# Patient Record
Sex: Female | Born: 1940 | Race: White | Hispanic: Yes | Marital: Married | Smoking: Former smoker
Health system: Western US, Academic
[De-identification: ages and names within clinical notes are randomized; demographics above are authoritative.]

## PROBLEM LIST (undated history)

## (undated) DIAGNOSIS — H409 Unspecified glaucoma: Secondary | ICD-10-CM

## (undated) DIAGNOSIS — R188 Other ascites: Secondary | ICD-10-CM

## (undated) DIAGNOSIS — J439 Emphysema, unspecified: Secondary | ICD-10-CM

## (undated) DIAGNOSIS — M8580 Other specified disorders of bone density and structure, unspecified site: Secondary | ICD-10-CM

## (undated) DIAGNOSIS — K862 Cyst of pancreas: Principal | ICD-10-CM

## (undated) DIAGNOSIS — I8500 Esophageal varices without bleeding: Secondary | ICD-10-CM

## (undated) DIAGNOSIS — K7460 Unspecified cirrhosis of liver: Secondary | ICD-10-CM

## (undated) DIAGNOSIS — D649 Anemia, unspecified: Secondary | ICD-10-CM

## (undated) DIAGNOSIS — B1920 Unspecified viral hepatitis C without hepatic coma: Secondary | ICD-10-CM

## (undated) DIAGNOSIS — G43909 Migraine, unspecified, not intractable, without status migrainosus: Secondary | ICD-10-CM

## (undated) HISTORY — DX: Unspecified viral hepatitis C without hepatic coma: B19.20

## (undated) HISTORY — PX: PB LAPS SURG CHOLECSTC W/EXPL COMMON DUCT: 47564

## (undated) HISTORY — DX: Esophageal varices without bleeding (CMS-HCC): I85.00

## (undated) HISTORY — DX: Migraine, unspecified, not intractable, without status migrainosus: G43.909

## (undated) HISTORY — DX: Other specified disorders of bone density and structure, unspecified site: M85.80

## (undated) HISTORY — DX: Cyst of pancreas: K86.2

## (undated) HISTORY — PX: TIPS PROCEDURE: SHX808

## (undated) HISTORY — DX: Anemia, unspecified: D64.9

## (undated) HISTORY — DX: Unspecified cirrhosis of liver (CMS-HCC): K74.60

## (undated) HISTORY — DX: Emphysema, unspecified (CMS-HCC): J43.9

## (undated) HISTORY — DX: Unspecified glaucoma: H40.9

## (undated) HISTORY — DX: Other ascites: R18.8

## (undated) HISTORY — PX: PB CSTOPLASTY/CSTOURTP PLSTC ANY: 51800

## (undated) MED ORDER — HEPARIN SODIUM (PORCINE) 10000 UNIT/ML IJ SOLN
5000.00 [IU] | Freq: Two times a day (BID) | INTRAMUSCULAR | Status: AC
Start: 2010-12-27 — End: ?

## (undated) MED ORDER — INSULIN GLARGINE 100 UNIT/ML SC SOLN
10.00 [IU] | Freq: Every evening | SUBCUTANEOUS | Status: AC
Start: 2010-12-27 — End: ?

---

## 2001-09-08 ENCOUNTER — Other Ambulatory Visit (INDEPENDENT_AMBULATORY_CARE_PROVIDER_SITE_OTHER): Payer: Self-pay | Admitting: Emergency Medicine

## 2001-09-08 LAB — URINALYSIS
Glucose: NEGATIVE
Ketones: NEGATIVE
Nitrite: NEGATIVE
Specific Gravity: 1.076 — ABNORMAL HIGH (ref 1.002–1.030)
Urobilinogen: 0.2 (ref 0.2–?)
pH: 7.5 (ref 5.0–8.0)

## 2001-09-09 ENCOUNTER — Other Ambulatory Visit (INDEPENDENT_AMBULATORY_CARE_PROVIDER_SITE_OTHER): Payer: Self-pay

## 2001-09-10 ENCOUNTER — Other Ambulatory Visit (HOSPITAL_BASED_OUTPATIENT_CLINIC_OR_DEPARTMENT_OTHER): Payer: Self-pay | Admitting: Pediatrics

## 2001-09-10 ENCOUNTER — Other Ambulatory Visit (HOSPITAL_BASED_OUTPATIENT_CLINIC_OR_DEPARTMENT_OTHER): Payer: Self-pay | Admitting: Internal Medicine

## 2001-09-10 LAB — BASIC METABOLIC PANEL, BLOOD
BUN: 5 mg/dL — ABNORMAL LOW (ref 8–18)
Bicarbonate: 30 mEq/L (ref 24–31)
Calcium: 7.8 mg/dL — ABNORMAL LOW (ref 9.0–10.6)
Chloride: 106 mEq/L (ref 97–107)
Creatinine: 0.7 mg/dL (ref 0.5–1.5)
Glucose: 122 mg/dL — ABNORMAL HIGH (ref 65–110)
Potassium: 3.8 mEq/L (ref 3.5–5.0)
Sodium: 139 mEq/L (ref 135–145)

## 2001-09-10 LAB — CBC WITH DIFF, BLOOD
Basophils: 2 % (ref 0–2)
Eosinophils: 2 % (ref 1–3)
Hct: 40.5 % (ref 36.0–46.0)
Hgb: 14.3 gm/dL (ref 12.0–16.0)
Lymphocytes: 20 % (ref 20–40)
MCH: 35.1 pg — ABNORMAL HIGH (ref 27–31)
MCHC: 35.2 % (ref 32–37)
MCV: 99.5 um3 — ABNORMAL HIGH (ref 82.0–98.0)
MPV: 8.3 fl (ref 7.4–10.4)
Monocytes: 11 % — ABNORMAL HIGH (ref 1–10)
Plt Count: 62 10*3/uL — ABNORMAL LOW (ref 130–400)
RBC: 4.07 10*6/uL (ref 4.00–5.00)
RDW: 12.1 % (ref 10–15)
Segs: 66 % (ref 45–70)
WBC: 3.6 10*3/uL — ABNORMAL LOW (ref 4.0–11.0)

## 2001-09-10 LAB — PHOSPHORUS, BLOOD: Phosphorous: 2.3 mg/dL — ABNORMAL LOW (ref 2.5–4.5)

## 2001-09-10 LAB — ADIF: Plt Est: LOW

## 2001-09-10 LAB — MAGNESIUM, BLOOD: Magnesium: 1.8 mg/dL (ref 1.8–2.5)

## 2001-09-13 LAB — BLOOD CULTURE

## 2002-02-16 ENCOUNTER — Other Ambulatory Visit (HOSPITAL_BASED_OUTPATIENT_CLINIC_OR_DEPARTMENT_OTHER): Payer: Self-pay | Admitting: Hepatology

## 2002-02-16 LAB — COMPREHENSIVE METABOLIC PANEL, BLOOD
ALT (SGPT): 288 IU/L — ABNORMAL HIGH (ref 10–45)
AST (SGOT): 239 IU/L — ABNORMAL HIGH (ref 10–45)
Albumin: 3.1 gm/dL — ABNORMAL LOW (ref 3.3–5.0)
Alkaline Phos: 74 IU/L (ref 30–130)
BUN: 18 mg/dL (ref 8–18)
Bicarbonate: 29 mEq/L (ref 24–31)
Bilirubin, Tot: 1.9 mg/dL — ABNORMAL HIGH (ref <1.2)
Calcium: 8.9 mg/dL — ABNORMAL LOW (ref 9.0–10.6)
Chloride: 97 mEq/L (ref 97–107)
Creatinine: 0.8 mg/dL (ref 0.5–1.5)
Glucose: 98 mg/dL (ref 65–110)
Potassium: 3.6 mEq/L (ref 3.5–5.0)
Sodium: 135 mEq/L (ref 135–145)
Total Protein: 7.9 gm/dL (ref 6.0–8.0)

## 2002-02-16 LAB — URIC ACID, BLOOD: Uric Acid: 4.9 mg/dL (ref 2.2–5.7)

## 2002-02-16 LAB — PROTHROMBIN TIME, BLOOD
INR: 1.4
Protime, Control: 10.1 s
Protime: 13.8 s — ABNORMAL HIGH (ref 9–12)

## 2002-02-16 LAB — CBC WITH DIFF, BLOOD
Basophils: 1 % (ref 0–2)
Eosinophils: 1 % (ref 1–3)
Hct: 42.4 % (ref 36.0–46.0)
Hgb: 15 gm/dL (ref 12.0–16.0)
Lymphocytes: 26 % (ref 20–40)
MCH: 35.3 pg — ABNORMAL HIGH (ref 27–31)
MCHC: 35.5 % (ref 32–37)
MCV: 99.4 um3 — ABNORMAL HIGH (ref 82.0–98.0)
MPV: 8.1 fl (ref 7.4–10.4)
Monocytes: 11 % — ABNORMAL HIGH (ref 1–10)
Plt Count: 67 10*3/uL — ABNORMAL LOW (ref 130–400)
RBC: 4.26 10*6/uL (ref 4.00–5.00)
RDW: 15.4 % — ABNORMAL HIGH (ref 10–15)
Segs: 61 % (ref 45–70)
WBC: 4.9 10*3/uL (ref 4.0–11.0)

## 2002-02-16 LAB — IRON BINDING CAPACITY, BLOOD: IBC (TIBC): 247 ug/dL — ABNORMAL LOW (ref 250–450)

## 2002-02-16 LAB — ADIF: Plt Est: LOW

## 2002-02-16 LAB — ALPHA FETOPROTEIN, SERUM: AFP: 5 ng/mL (ref <8)

## 2002-02-16 LAB — GAMMA GLUTAMYL TRANSFERASE, BLOOD: GGT: 24 IU/L (ref <30)

## 2002-02-16 LAB — IRON, BLOOD: Iron: 229 ug/dL — ABNORMAL HIGH (ref 65–175)

## 2002-02-16 LAB — APTT, BLOOD
PTT, Control: 27.1 s
PTT: 33.8 s — ABNORMAL HIGH (ref 25.0–33.0)

## 2002-02-16 LAB — TSH SENSITIVE, BLOOD: TSH Sensitive: 1.99 u[IU]/mL (ref 0.35–5.50)

## 2002-02-16 LAB — LIVER PANEL, BLOOD: Bilirubin, Dir: 0.4 mg/dL — ABNORMAL HIGH (ref <0.2)

## 2002-02-16 LAB — FERRITIN, BLOOD: Ferritin: 415 ng/mL

## 2002-02-16 LAB — AMYLASE, BLOOD: Amylase: 112 U/L (ref 30–150)

## 2002-02-17 LAB — HEPATITIS B CORE AB IGG, BLOOD: HBcAb IgG: NEGATIVE

## 2002-02-17 LAB — HEPATITIS B SURFACE AG, BLOOD: HBsAg: NEGATIVE

## 2002-02-18 LAB — HEPATITIS B SURFACE AB, BLOOD: HBsAb: NEGATIVE

## 2002-02-18 LAB — ANTI-SMOOTH MUSCLE AB, BLOOD: Anti-Smooth Muscle Ab: POSITIVE — ABNORMAL HIGH

## 2002-02-18 LAB — ANTI-SMOOTH MUSC AB-TITER, BLOOD: Anti-Smooth Muscle Ab Titer: 1:160 {titer} — ABNORMAL HIGH

## 2002-02-18 LAB — ANA (ANTI-NUCLEAR AB), BLOOD: ANA (Anti-Nuclear Ab): POSITIVE — ABNORMAL HIGH

## 2002-02-18 LAB — ANTI-NUCLEAR-AB-TITER, BLOOD: Anti-Nuclear Ab Titer: >1:640 {titer} — ABNORMAL HIGH

## 2002-02-21 LAB — HEPATITIS A IGM & TOTAL AB  PANEL, BLOOD
Hepatitis A Antibody IGM: NEGATIVE
Hepatitis A Antibody: POSITIVE

## 2002-02-23 LAB — HEPATITIS C RNA, QUANT BLOOD: Hepatitis C RNA Quant: 723000 IU/ml

## 2002-02-28 ENCOUNTER — Other Ambulatory Visit (INDEPENDENT_AMBULATORY_CARE_PROVIDER_SITE_OTHER): Payer: Self-pay | Admitting: Emergency Medicine

## 2002-03-08 LAB — HEPATITIS C GENOTYPING, BLOOD: Hepatitis C Genotyping: POSITIVE

## 2002-04-21 ENCOUNTER — Other Ambulatory Visit (HOSPITAL_BASED_OUTPATIENT_CLINIC_OR_DEPARTMENT_OTHER): Payer: Self-pay

## 2002-04-28 ENCOUNTER — Other Ambulatory Visit (HOSPITAL_BASED_OUTPATIENT_CLINIC_OR_DEPARTMENT_OTHER): Payer: Self-pay | Admitting: Hepatology

## 2002-04-28 LAB — ARTERIAL BLOOD GAS
BE, Art: 0.8 mmol/L
FIO2: 21 %
HCO3, Art: 24 mmol/L (ref 20–29)
O2 Sat, Art (Est): 97.6 %
Temp: 36.9 'C
pCO2, Art (T): 35 mmHG — ABNORMAL LOW (ref 36–46)
pCO2, Art (Uncorr): 35 mmHg
pH, Art (T): 7.46 (ref 7.35–7.46)
pH, Art (Uncorr): 7.46
pO2, Art (T): 95 mmHG (ref 74–109)
pO2, Art (Uncorr): 95 mmHg

## 2002-07-21 ENCOUNTER — Other Ambulatory Visit (HOSPITAL_BASED_OUTPATIENT_CLINIC_OR_DEPARTMENT_OTHER): Payer: Self-pay | Admitting: Hepatology

## 2002-07-21 LAB — APTT, BLOOD
PTT, Control: 25.7 s
PTT: 32.5 s (ref 25.0–33.0)

## 2002-07-21 LAB — CBC WITH DIFF, BLOOD
Basophils: 1 % (ref 0–2)
Eosinophils: 2 % (ref 1–3)
Hct: 42.3 % (ref 36.0–46.0)
Hgb: 14.9 gm/dL (ref 12.0–16.0)
Lymphocytes: 27 % (ref 20–40)
MCH: 35.1 pg — ABNORMAL HIGH (ref 27–31)
MCHC: 35.1 % (ref 32–37)
MCV: 99.9 um3 — ABNORMAL HIGH (ref 82.0–98.0)
MPV: 8.8 fl (ref 7.4–10.4)
Monocytes: 11 % — ABNORMAL HIGH (ref 1–10)
Plt Count: 63 10*3/uL — ABNORMAL LOW (ref 130–400)
RBC: 4.24 10*6/uL (ref 4.00–5.00)
RDW: 12.9 % (ref 10–15)
Segs: 59 % (ref 45–70)
WBC: 4 10*3/uL (ref 4.0–11.0)

## 2002-07-21 LAB — ADIF: Plt Est: LOW

## 2002-07-21 LAB — LIVER PANEL, BLOOD: Bilirubin, Dir: 0.7 mg/dL — ABNORMAL HIGH (ref <0.2)

## 2002-07-21 LAB — PROTHROMBIN TIME, BLOOD
INR: 1.4
Protime, Control: 9.9 s
Protime: 13.6 s — ABNORMAL HIGH (ref 9–12)

## 2002-07-21 LAB — COMPREHENSIVE METABOLIC PANEL, BLOOD
ALT (SGPT): 238 IU/L — ABNORMAL HIGH (ref 10–45)
AST (SGOT): 203 IU/L — ABNORMAL HIGH (ref 10–45)
Albumin: 3.1 gm/dL — ABNORMAL LOW (ref 3.3–5.0)
Alkaline Phos: 73 IU/L (ref 30–130)
Bilirubin, Tot: 2.6 mg/dL — ABNORMAL HIGH (ref <1.2)
Total Protein: 7.6 gm/dL (ref 6.0–8.0)

## 2002-07-21 LAB — BASIC METABOLIC PANEL, BLOOD
BUN: 9 mg/dL (ref 8–18)
Bicarbonate: 30 mEq/L (ref 24–31)
Calcium: 8.7 mg/dL — ABNORMAL LOW (ref 9.0–10.6)
Chloride: 99 mEq/L (ref 97–107)
Creatinine: 0.7 mg/dL (ref 0.5–1.5)
Glucose: 145 mg/dL — ABNORMAL HIGH (ref 65–110)
Potassium: 4.1 mEq/L (ref 3.5–5.0)
Sodium: 137 mEq/L (ref 135–145)

## 2002-07-21 LAB — MAGNESIUM, BLOOD: Magnesium: 1.9 mg/dL (ref 1.8–2.5)

## 2002-07-29 ENCOUNTER — Other Ambulatory Visit (HOSPITAL_BASED_OUTPATIENT_CLINIC_OR_DEPARTMENT_OTHER): Payer: Self-pay | Admitting: Hepatology

## 2002-10-18 ENCOUNTER — Other Ambulatory Visit: Payer: Self-pay | Admitting: Transplant Surgery

## 2002-10-27 ENCOUNTER — Other Ambulatory Visit (HOSPITAL_BASED_OUTPATIENT_CLINIC_OR_DEPARTMENT_OTHER): Payer: Self-pay | Admitting: Hepatology

## 2002-10-27 LAB — COMPREHENSIVE METABOLIC PANEL, BLOOD
ALT (SGPT): 180 IU/L — ABNORMAL HIGH (ref 10–45)
AST (SGOT): 144 IU/L — ABNORMAL HIGH (ref 10–45)
Albumin: 2.6 gm/dL — ABNORMAL LOW (ref 3.3–5.0)
Alkaline Phos: 67 IU/L (ref 30–130)
BUN: 4 mg/dL — ABNORMAL LOW (ref 8–18)
Bicarbonate: 27 mEq/L (ref 24–31)
Bilirubin, Tot: 2.4 mg/dL — ABNORMAL HIGH (ref <1.2)
Calcium: 8.1 mg/dL — ABNORMAL LOW (ref 9.0–10.6)
Chloride: 105 mEq/L (ref 97–107)
Creatinine: 0.6 mg/dL (ref 0.5–1.5)
Glucose: 138 mg/dL — ABNORMAL HIGH (ref 65–110)
Potassium: 4.3 mEq/L (ref 3.5–5.0)
Sodium: 137 mEq/L (ref 135–145)
Total Protein: 6.6 gm/dL (ref 6.0–8.0)

## 2002-10-27 LAB — PROTHROMBIN TIME, BLOOD
INR: 1.4
Protime, Control: 9.7 s
Protime: 13.8 s — ABNORMAL HIGH (ref 9–12)

## 2002-10-27 LAB — HEMOGRAM, BLOOD
Hct: 40.2 % (ref 36.0–46.0)
Hgb: 13.6 gm/dL (ref 12.0–16.0)
MCH: 34.4 pg — ABNORMAL HIGH (ref 27–31)
MCHC: 33.8 % (ref 32–37)
MCV: 102 um3 — ABNORMAL HIGH (ref 82.0–98.0)
Plt Count: 39 10*3/uL — ABNORMAL LOW (ref 130–400)
RBC: 3.95 10*6/uL — ABNORMAL LOW (ref 4.00–5.00)
RDW: 14.1 % (ref 10–15)
WBC: 3.6 10*3/uL — ABNORMAL LOW (ref 4.0–11.0)

## 2002-10-27 LAB — APTT, BLOOD
PTT, Control: 26.7 s
PTT: 33.8 s — ABNORMAL HIGH (ref 25.0–33.0)

## 2002-10-27 LAB — MAGNESIUM, BLOOD: Magnesium: 2 mg/dL (ref 1.8–2.5)

## 2002-10-27 LAB — GAMMA GLUTAMYL TRANSFERASE, BLOOD: GGT: 15 IU/L (ref <30)

## 2002-11-02 ENCOUNTER — Encounter (INDEPENDENT_AMBULATORY_CARE_PROVIDER_SITE_OTHER): Payer: Self-pay

## 2002-12-07 ENCOUNTER — Other Ambulatory Visit (HOSPITAL_BASED_OUTPATIENT_CLINIC_OR_DEPARTMENT_OTHER): Payer: Self-pay

## 2002-12-14 ENCOUNTER — Other Ambulatory Visit (HOSPITAL_BASED_OUTPATIENT_CLINIC_OR_DEPARTMENT_OTHER): Payer: Self-pay | Admitting: Hepatology

## 2002-12-14 ENCOUNTER — Other Ambulatory Visit: Payer: Self-pay | Admitting: Diagnostic Radiology

## 2002-12-14 ENCOUNTER — Other Ambulatory Visit (HOSPITAL_BASED_OUTPATIENT_CLINIC_OR_DEPARTMENT_OTHER): Payer: Self-pay

## 2002-12-14 LAB — ADIF: Plt Est: LOW

## 2002-12-14 LAB — CBC WITH DIFF, BLOOD
Basophils: 1 % (ref 0–2)
Eosinophils: 1 % (ref 1–3)
Hct: 38.8 % (ref 36.0–46.0)
Hgb: 13.1 gm/dL (ref 12.0–16.0)
Lymphocytes: 23 % (ref 20–40)
MCH: 34 pg — ABNORMAL HIGH (ref 27–31)
MCHC: 33.8 % (ref 32–37)
MCV: 101 um3 — ABNORMAL HIGH (ref 82.0–98.0)
MPV: 8.8 fl (ref 7.4–10.4)
Monocytes: 10 % (ref 1–10)
Plt Count: 51 10*3/uL — ABNORMAL LOW (ref 130–400)
RBC: 3.86 10*6/uL — ABNORMAL LOW (ref 4.00–5.00)
RDW: 14.2 % (ref 10–15)
Segs: 65 % (ref 45–70)
WBC: 3.8 10*3/uL — ABNORMAL LOW (ref 4.0–11.0)

## 2002-12-14 LAB — COMPREHENSIVE METABOLIC PANEL, BLOOD
ALT (SGPT): 149 IU/L — ABNORMAL HIGH (ref 10–45)
AST (SGOT): 130 IU/L — ABNORMAL HIGH (ref 10–45)
Albumin: 2.6 gm/dL — ABNORMAL LOW (ref 3.3–5.0)
Alkaline Phos: 74 IU/L (ref 30–130)
BUN: 8 mg/dL (ref 8–18)
Bicarbonate: 28 mEq/L (ref 24–31)
Bilirubin, Tot: 2.3 mg/dL — ABNORMAL HIGH (ref <1.2)
Calcium: 8 mg/dL — ABNORMAL LOW (ref 9.0–10.6)
Chloride: 101 mEq/L (ref 97–107)
Creatinine: 0.6 mg/dL (ref 0.5–1.5)
Glucose: 152 mg/dL — ABNORMAL HIGH (ref 65–110)
Potassium: 3.6 mEq/L (ref 3.5–5.0)
Sodium: 136 mEq/L (ref 135–145)
Total Protein: 6.7 gm/dL (ref 6.0–8.0)

## 2002-12-14 LAB — PROTHROMBIN TIME, BLOOD
INR: 1.4
Protime, Control: 9.8 s
Protime: 14 s — ABNORMAL HIGH (ref 9–12)

## 2002-12-14 LAB — IMMUNOGLOBULIN PANEL (IGA,IGG,IGM), BLOOD
IGA: 292 mg/dL (ref 66–436)
IGG: 2781 mg/dL — ABNORMAL HIGH (ref 791–1643)
IGM: 147 mg/dL (ref 43–279)

## 2002-12-14 LAB — LIPID(CHOL FRACT) PANEL, BLOOD
Cholesterol: 151 mg/dL (ref 120–200)
HDL-Cholesterol: 57 mg/dL (ref ?–85)
LDL-Chol (Direct): 65 mg/dL (ref <160)
Triglycerides: 63 mg/dL (ref 10–170)

## 2002-12-14 LAB — FREE THYROXINE, BLOOD: Free T4: 1.09 ng/dL (ref 0.90–1.80)

## 2002-12-14 LAB — LIVER PANEL, BLOOD: Bilirubin, Dir: 0.7 mg/dL — ABNORMAL HIGH (ref <0.2)

## 2002-12-14 LAB — PREALBUMIN (TRANSTHYRETIN), BLOOD: Prealbumin: 7 mg/dL — ABNORMAL LOW (ref 18–38)

## 2002-12-15 LAB — CMV IGG/IGM AB PANEL, BLOOD
CMV IGG Ab: POSITIVE
CMV IGM Ab: NEGATIVE

## 2002-12-15 LAB — HERPES SIMPLEX 1/2 IGG AB, BLOOD: Herpes Simplex 1/2 IGG Ab: POSITIVE

## 2002-12-15 LAB — RAPID PLASMA REAGIN, BLOOD: RPR: NONREACTIVE

## 2002-12-16 LAB — HEPATITIS C GENOTYPING, BLOOD: Hepatitis C Genotyping: POSITIVE

## 2002-12-16 LAB — HTLV 1/2, BLOOD: HTLV 1/2 Ab (Elisa): NEGATIVE

## 2002-12-16 LAB — HEPATITIS C RNA, QUANT BLOOD: Hepatitis C RNA Quant: >700000 IU/ml

## 2002-12-21 LAB — EPG, SERUM
A/G Ratio, EPG: 0.7
Albumin, EPG: 2.75 gm/dL — ABNORMAL LOW (ref 3.20–5.00)
Alpha 1, EPG: 0.2 gm/dL (ref 0.10–0.40)
Alpha 2, EPG: 0.4 gm/dL — ABNORMAL LOW (ref 0.60–1.10)
Beta, EPG: 0.49 gm/dL — ABNORMAL LOW (ref 0.60–1.30)
Gamma, EPG: 2.86 gm/dL — ABNORMAL HIGH (ref 0.70–1.50)
Total Protein, EPG: 6.7 gm/dL (ref 6.00–8.00)

## 2002-12-22 LAB — PIVKA2, BLOOD: PIVKA: <2 ng/mL (ref 0.0–3.5)

## 2002-12-23 LAB — E.BARR VIRUS IGG/IGM PANEL, BLOOD
Epstein Barr IGG Ab: POSITIVE
Epstein Barr IGM Ab: NEGATIVE

## 2003-02-01 ENCOUNTER — Other Ambulatory Visit (HOSPITAL_BASED_OUTPATIENT_CLINIC_OR_DEPARTMENT_OTHER): Payer: Self-pay | Admitting: Hepatology

## 2003-02-01 LAB — CBC WITH DIFF, BLOOD
Basophils: 1 % (ref 0–2)
Eosinophils: 2 % (ref 1–3)
Hct: 39.6 % (ref 36.0–46.0)
Hgb: 13.8 gm/dL (ref 12.0–16.0)
Lymphocytes: 27 % (ref 20–40)
MCH: 35.7 pg — ABNORMAL HIGH (ref 27–31)
MCHC: 34.9 % (ref 32–37)
MCV: 102 um3 — ABNORMAL HIGH (ref 82.0–98.0)
MPV: 8.9 fl (ref 7.4–10.4)
Monocytes: 9 % (ref 1–10)
Plt Count: 58 10*3/uL — ABNORMAL LOW (ref 130–400)
RBC: 3.88 10*6/uL — ABNORMAL LOW (ref 4.00–5.00)
RDW: 12.6 % (ref 10–15)
Segs: 60 % (ref 45–70)
WBC: 3.2 10*3/uL — ABNORMAL LOW (ref 4.0–11.0)

## 2003-02-01 LAB — PROTHROMBIN TIME, BLOOD
INR: 1.5
Protime, Control: 9.7 s
Protime: 14.3 s — ABNORMAL HIGH (ref 9–12)

## 2003-02-01 LAB — COMPREHENSIVE METABOLIC PANEL, BLOOD
ALT (SGPT): 208 IU/L — ABNORMAL HIGH (ref 10–45)
AST (SGOT): 209 IU/L — ABNORMAL HIGH (ref 10–45)
Albumin: 2.8 gm/dL — ABNORMAL LOW (ref 3.3–5.0)
Alkaline Phos: 82 IU/L (ref 30–130)
BUN: 9 mg/dL (ref 8–18)
Bicarbonate: 28 mEq/L (ref 24–31)
Bilirubin, Tot: 2.9 mg/dL — ABNORMAL HIGH (ref <1.2)
Calcium: 8.3 mg/dL — ABNORMAL LOW (ref 9.0–10.6)
Chloride: 101 mEq/L (ref 97–107)
Creatinine: 0.7 mg/dL (ref 0.5–1.5)
Glucose: 130 mg/dL — ABNORMAL HIGH (ref 65–110)
Potassium: 3.4 mEq/L — ABNORMAL LOW (ref 3.5–5.0)
Sodium: 135 mEq/L (ref 135–145)
Total Protein: 7.7 gm/dL (ref 6.0–8.0)

## 2003-02-01 LAB — APTT, BLOOD
PTT, Control: 26.5 s
PTT: 33.8 s — ABNORMAL HIGH (ref 25.0–33.0)

## 2003-02-01 LAB — ADIF: Plt Est: LOW

## 2003-02-01 LAB — BILIRUBIN, DIR BLOOD: Bilirubin, Dir: 0.8 mg/dL — ABNORMAL HIGH (ref <0.2)

## 2003-02-01 LAB — URIC ACID, BLOOD: Uric Acid: 4.2 mg/dL (ref 2.2–5.7)

## 2003-02-01 LAB — ALPHA FETOPROTEIN, SERUM: AFP: 5 ng/mL (ref <8)

## 2003-02-02 LAB — GLYCOSYLATED HGB(A1C), BLOOD: Glyco Hgb (A1C): 6.8 % — ABNORMAL HIGH (ref 4.6–6.2)

## 2003-03-03 ENCOUNTER — Other Ambulatory Visit (HOSPITAL_BASED_OUTPATIENT_CLINIC_OR_DEPARTMENT_OTHER): Payer: Self-pay | Admitting: Hepatology

## 2003-03-06 ENCOUNTER — Other Ambulatory Visit (HOSPITAL_BASED_OUTPATIENT_CLINIC_OR_DEPARTMENT_OTHER): Payer: Self-pay

## 2003-03-14 ENCOUNTER — Other Ambulatory Visit (HOSPITAL_BASED_OUTPATIENT_CLINIC_OR_DEPARTMENT_OTHER): Payer: Self-pay | Admitting: Hepatology

## 2003-03-14 LAB — ARTERIAL BLOOD GAS
BE, Art: 1.7 mmol/L
FIO2: 21 %
HCO3, Art: 25 mmol/L (ref 20–29)
O2 Sat, Art (Est): 97.4 %
Temp: 36.4 'C
pCO2, Art (T): 35 mmHG — ABNORMAL LOW (ref 36–46)
pCO2, Art (Uncorr): 36 mmHg
pH, Art (T): 7.47 — ABNORMAL HIGH (ref 7.35–7.46)
pH, Art (Uncorr): 7.46
pO2, Art (T): 87 mmHG (ref 74–109)
pO2, Art (Uncorr): 91 mmHg

## 2003-04-24 ENCOUNTER — Other Ambulatory Visit (HOSPITAL_BASED_OUTPATIENT_CLINIC_OR_DEPARTMENT_OTHER): Payer: Self-pay

## 2003-05-30 ENCOUNTER — Other Ambulatory Visit (HOSPITAL_BASED_OUTPATIENT_CLINIC_OR_DEPARTMENT_OTHER): Payer: Self-pay | Admitting: Internal Medicine

## 2003-06-29 ENCOUNTER — Other Ambulatory Visit (HOSPITAL_BASED_OUTPATIENT_CLINIC_OR_DEPARTMENT_OTHER): Payer: Self-pay | Admitting: Hepatology

## 2003-06-29 LAB — COMPREHENSIVE METABOLIC PANEL, BLOOD
ALT (SGPT): 179 IU/L — ABNORMAL HIGH (ref 10–45)
AST (SGOT): 149 IU/L — ABNORMAL HIGH (ref 10–45)
Albumin: 2.6 gm/dL — ABNORMAL LOW (ref 3.3–5.0)
Alkaline Phos: 112 IU/L (ref 30–130)
BUN: 9 mg/dL (ref 8–18)
Bicarbonate: 27 mEq/L (ref 24–31)
Bilirubin, Tot: 2.4 mg/dL — ABNORMAL HIGH (ref <1.2)
Calcium: 8.1 mg/dL — ABNORMAL LOW (ref 9.0–10.6)
Chloride: 99 mEq/L (ref 97–107)
Creatinine: 0.6 mg/dL (ref 0.5–1.5)
Glucose: 253 mg/dL — ABNORMAL HIGH (ref 65–110)
Potassium: 4 mEq/L (ref 3.5–5.0)
Sodium: 134 mEq/L — ABNORMAL LOW (ref 135–145)
Total Protein: 7.6 gm/dL (ref 6.0–8.0)

## 2003-06-29 LAB — CBC WITH DIFF, BLOOD
Hct: 43.1 % (ref 36.0–46.0)
Hgb: 14.6 gm/dL (ref 12.0–16.0)
MCH: 34.6 pg — ABNORMAL HIGH (ref 27–31)
MCHC: 33.9 % (ref 32–37)
MCV: 102 um3 — ABNORMAL HIGH (ref 82.0–98.0)
MPV: 8.8 fl (ref 7.4–10.4)
Plt Count: 67 10*3/uL — ABNORMAL LOW (ref 130–400)
RBC: 4.21 10*6/uL (ref 4.00–5.00)
RDW: 13 % (ref 10–15)
WBC: 6.1 10*3/uL (ref 4.0–11.0)

## 2003-06-29 LAB — URINALYSIS
Bilirubin: NEGATIVE
Ketones: NEGATIVE
Leuk Esterase: NEGATIVE
Nitrite: NEGATIVE
Protein: NEGATIVE
Specific Gravity: 1.025 (ref 1.002–1.030)
Urobilinogen: 0.2 (ref 0.2–?)
pH: 5.5 (ref 5.0–8.0)

## 2003-06-29 LAB — PROTHROMBIN TIME, BLOOD
INR: 1.6
Protime, Control: 10 s
Protime: 15.4 s — ABNORMAL HIGH (ref 9–12)

## 2003-06-29 LAB — MDIFF
Bands: 20 % — ABNORMAL HIGH (ref 0–15)
Diff Total: 100 %
Eosinophils: 1 % (ref 1–3)
Lymphocytes: 4 % — ABNORMAL LOW (ref 20–40)
Monocytes: 2 % (ref 1–10)
Plt Est: LOW
Segs: 73 % — ABNORMAL HIGH (ref 45–70)

## 2003-06-29 LAB — APTT, BLOOD
PTT, Control: 26.6 s
PTT: 34.5 s — ABNORMAL HIGH (ref 25.0–33.0)

## 2003-06-29 LAB — TSH SENSITIVE, BLOOD: TSH Sensitive: 0.79 u[IU]/mL (ref 0.35–5.50)

## 2003-06-29 LAB — ALPHA FETOPROTEIN, SERUM: AFP: 4 ng/mL (ref <8)

## 2003-06-29 LAB — LIVER PANEL, BLOOD: Bilirubin, Dir: 0.6 mg/dL — ABNORMAL HIGH (ref <0.2)

## 2003-06-30 ENCOUNTER — Other Ambulatory Visit (INDEPENDENT_AMBULATORY_CARE_PROVIDER_SITE_OTHER): Payer: Self-pay | Admitting: Emergency Medicine

## 2003-06-30 ENCOUNTER — Other Ambulatory Visit (INDEPENDENT_AMBULATORY_CARE_PROVIDER_SITE_OTHER): Payer: Self-pay | Admitting: Internal Medicine

## 2003-07-01 ENCOUNTER — Other Ambulatory Visit (HOSPITAL_BASED_OUTPATIENT_CLINIC_OR_DEPARTMENT_OTHER): Payer: Self-pay | Admitting: Internal Medicine

## 2003-07-12 ENCOUNTER — Other Ambulatory Visit (HOSPITAL_BASED_OUTPATIENT_CLINIC_OR_DEPARTMENT_OTHER): Payer: Self-pay | Admitting: Hepatology

## 2003-07-12 LAB — COMPREHENSIVE METABOLIC PANEL, BLOOD
ALT (SGPT): 152 IU/L — ABNORMAL HIGH (ref 10–45)
AST (SGOT): 155 IU/L — ABNORMAL HIGH (ref 10–45)
Albumin: 3.3 gm/dL (ref 3.3–5.0)
Alkaline Phos: 89 IU/L (ref 30–130)
BUN: 7 mg/dL — ABNORMAL LOW (ref 8–18)
Bicarbonate: 29 mEq/L (ref 24–31)
Bilirubin, Tot: 2.7 mg/dL — ABNORMAL HIGH (ref <1.2)
Calcium: 8.5 mg/dL — ABNORMAL LOW (ref 8.8–10.3)
Chloride: 96 mEq/L — ABNORMAL LOW (ref 97–107)
Creatinine: 0.6 mg/dL (ref 0.5–1.5)
Glucose: 92 mg/dL (ref 65–110)
Potassium: 3.9 mEq/L (ref 3.5–5.0)
Sodium: 133 mEq/L — ABNORMAL LOW (ref 135–145)
Total Protein: 8 gm/dL (ref 6.0–8.0)

## 2003-07-12 LAB — CBC WITH DIFF, BLOOD
Basophils: 1 % (ref 0–2)
Eosinophils: 2 % (ref 1–3)
Hct: 42.3 % (ref 36.0–46.0)
Hgb: 14.7 gm/dL (ref 12.0–16.0)
Lymphocytes: 29 % (ref 20–40)
MCH: 35.2 pg — ABNORMAL HIGH (ref 27–31)
MCHC: 34.8 % (ref 32–37)
MCV: 101 um3 — ABNORMAL HIGH (ref 82.0–98.0)
MPV: 8.4 fl (ref 7.4–10.4)
Monocytes: 8 % (ref 1–10)
Plt Count: 59 10*3/uL — ABNORMAL LOW (ref 130–400)
RBC: 4.19 10*6/uL (ref 4.00–5.00)
RDW: 13 % (ref 10–15)
Segs: 60 % (ref 45–70)
WBC: 3.4 10*3/uL — ABNORMAL LOW (ref 4.0–11.0)

## 2003-07-12 LAB — PROTHROMBIN TIME, BLOOD
INR: 1.6
Protime, Control: 10 s
Protime: 15.1 s — ABNORMAL HIGH (ref 9–12)

## 2003-07-12 LAB — LIVER PANEL, BLOOD: Bilirubin, Dir: 0.6 mg/dL — ABNORMAL HIGH (ref <0.2)

## 2003-07-12 LAB — ALPHA FETOPROTEIN, SERUM: AFP: 4 ng/mL (ref <8)

## 2003-07-12 LAB — APTT, BLOOD
PTT, Control: 27.3 s
PTT: 34.6 s — ABNORMAL HIGH (ref 25.0–33.0)

## 2003-07-12 LAB — TSH SENSITIVE, BLOOD: TSH Sensitive: 1.7 u[IU]/mL (ref 0.35–5.50)

## 2003-07-12 LAB — ADIF: Plt Est: LOW

## 2003-07-12 LAB — URIC ACID, BLOOD: Uric Acid: 4.8 mg/dL (ref 2.2–5.7)

## 2003-07-17 LAB — HEPATITIS C RNA, QUANT BLOOD: Hepatitis C RNA Quant: 142000 IU/ml

## 2003-08-07 ENCOUNTER — Other Ambulatory Visit (HOSPITAL_BASED_OUTPATIENT_CLINIC_OR_DEPARTMENT_OTHER): Payer: Self-pay | Admitting: Hepatology

## 2003-08-07 LAB — COMPREHENSIVE METABOLIC PANEL, BLOOD
ALT (SGPT): 180 IU/L — ABNORMAL HIGH (ref 10–45)
AST (SGOT): 172 IU/L — ABNORMAL HIGH (ref 10–45)
Albumin: 3 gm/dL — ABNORMAL LOW (ref 3.3–5.0)
Alkaline Phos: 94 IU/L (ref 30–130)
BUN: 10 mg/dL (ref 8–18)
Bicarbonate: 28 mEq/L (ref 24–31)
Bilirubin, Tot: 3.5 mg/dL — ABNORMAL HIGH (ref <1.2)
Calcium: 8.6 mg/dL — ABNORMAL LOW (ref 8.8–10.3)
Chloride: 103 mEq/L (ref 97–107)
Creatinine: 0.8 mg/dL (ref 0.5–1.5)
Glucose: 151 mg/dL — ABNORMAL HIGH (ref 65–110)
Potassium: 4.8 mEq/L (ref 3.5–5.0)
Sodium: 140 mEq/L (ref 135–145)
Total Protein: 7.3 gm/dL (ref 6.0–8.0)

## 2003-08-07 LAB — CBC WITH DIFF, BLOOD
Basophils: 1 % (ref 0–2)
Eosinophils: 2 % (ref 1–3)
Hct: 41.7 % (ref 36.0–46.0)
Hgb: 14.3 gm/dL (ref 12.0–16.0)
Lymphocytes: 26 % (ref 20–40)
MCH: 34.8 pg — ABNORMAL HIGH (ref 27–31)
MCHC: 34.4 % (ref 32–37)
MCV: 101 um3 — ABNORMAL HIGH (ref 82.0–98.0)
MPV: 9.6 fl (ref 7.4–10.4)
Monocytes: 9 % (ref 1–10)
Plt Count: 54 10*3/uL — ABNORMAL LOW (ref 130–400)
RBC: 4.12 10*6/uL (ref 4.00–5.00)
RDW: 12.7 % (ref 10–15)
Segs: 63 % (ref 45–70)
WBC: 3.5 10*3/uL — ABNORMAL LOW (ref 4.0–11.0)

## 2003-08-07 LAB — LIVER PANEL, BLOOD: Bilirubin, Dir: 1.1 mg/dL — ABNORMAL HIGH (ref <0.2)

## 2003-08-07 LAB — ADIF: Plt Est: LOW

## 2003-08-07 LAB — APTT, BLOOD
PTT, Control: 26.3 s
PTT: 34.9 s — ABNORMAL HIGH (ref 25.0–33.0)

## 2003-08-07 LAB — PROTHROMBIN TIME, BLOOD
INR: 1.4
Protime, Control: 10 s
Protime: 14.1 s — ABNORMAL HIGH (ref 9–12)

## 2003-08-16 ENCOUNTER — Other Ambulatory Visit (INDEPENDENT_AMBULATORY_CARE_PROVIDER_SITE_OTHER): Payer: Self-pay

## 2003-08-16 LAB — COMPREHENSIVE METABOLIC PANEL, BLOOD
ALT (SGPT): 171 [IU]/L — ABNORMAL HIGH (ref 10–45)
AST (SGOT): 178 [IU]/L — ABNORMAL HIGH (ref 10–45)
Albumin: 3 g/dL — ABNORMAL LOW (ref 3.3–5.0)
Alkaline Phos: 94 [IU]/L (ref 30–130)
BUN: 8 mg/dL (ref 8–18)
Bicarbonate: 30 meq/L (ref 24–31)
Bilirubin, Tot: 2.6 mg/dL — ABNORMAL HIGH (ref <1.2)
Calcium: 8.7 mg/dL — ABNORMAL LOW (ref 8.8–10.3)
Chloride: 101 meq/L (ref 97–107)
Creatinine: 0.7 mg/dL (ref 0.5–1.5)
Glucose: 131 mg/dL — ABNORMAL HIGH (ref 65–110)
Potassium: 4.1 meq/L (ref 3.5–5.0)
Sodium: 138 meq/L (ref 135–145)
Total Protein: 7.4 g/dL (ref 6.0–8.0)

## 2003-08-16 LAB — PROTHROMBIN TIME, BLOOD
INR: 1.4
Protime, Control: 10 s
Protime: 13.4 s — ABNORMAL HIGH (ref 9–12)

## 2003-08-16 LAB — CBC WITH DIFF, BLOOD
Basophils: 1 % (ref 0–2)
Eosinophils: 2 % (ref 1–3)
Hct: 41.3 % (ref 36.0–46.0)
Hgb: 14.1 g/dL (ref 12.0–16.0)
Lymphocytes: 24 % (ref 20–40)
MCH: 34 pg — ABNORMAL HIGH (ref 27–31)
MCHC: 34.3 % (ref 32–37)
MCV: 99.1 um3 — ABNORMAL HIGH (ref 82.0–98.0)
MPV: 9.3 fL (ref 7.4–10.4)
Monocytes: 12 % — ABNORMAL HIGH (ref 1–10)
Plt Count: 49 10*3/uL — ABNORMAL LOW (ref 130–400)
RBC: 4.16 10*6/uL (ref 4.00–5.00)
RDW: 15.5 % — ABNORMAL HIGH (ref 10–15)
Segs: 61 % (ref 45–70)
WBC: 2.5 10*3/uL — ABNORMAL LOW (ref 4.0–11.0)

## 2003-08-16 LAB — APTT, BLOOD
PTT, Control: 26.9 s
PTT: 33.7 s — ABNORMAL HIGH (ref 25.0–33.0)

## 2003-08-16 LAB — LIVER PANEL, BLOOD: Bilirubin, Dir: 0.7 mg/dL — ABNORMAL HIGH (ref <0.2)

## 2003-08-16 LAB — URIC ACID, BLOOD: Uric Acid: 5.5 mg/dL (ref 2.2–5.7)

## 2003-08-16 LAB — ADIF: Plt Est: LOW

## 2003-08-16 LAB — TSH SENSITIVE, BLOOD: TSH Sensitive: 2.6 u[IU]/mL (ref 0.35–5.50)

## 2003-08-16 LAB — ALPHA FETOPROTEIN, SERUM: AFP: 3 ng/mL (ref <8)

## 2003-08-16 LAB — CEA, BLOOD: CEA: 4.8 ng/mL

## 2003-08-22 LAB — HEPATITIS C RNA, QUANT BLOOD: Hepatitis C RNA Quant: 403000 [IU]/mL

## 2003-08-23 ENCOUNTER — Other Ambulatory Visit (INDEPENDENT_AMBULATORY_CARE_PROVIDER_SITE_OTHER): Payer: Self-pay | Admitting: Hepatology

## 2003-08-23 LAB — MDIFF
Bands: 7 % (ref 0–15)
Eosinophils: 2 % (ref 1–3)
Lymphocytes: 38 % (ref 20–40)
Monocytes: 6 % (ref 1–10)
Plt Est: LOW
Segs: 100 %
Segs: 47 % (ref 45–70)

## 2003-08-23 LAB — COMPREHENSIVE METABOLIC PANEL, BLOOD
ALT (SGPT): 105 IU/L — ABNORMAL HIGH (ref 10–45)
AST (SGOT): 94 IU/L — ABNORMAL HIGH (ref 10–45)
Albumin: 2.8 gm/dL — ABNORMAL LOW (ref 3.3–5.0)
Alkaline Phos: 94 IU/L (ref 30–130)
BUN: 11 mg/dL (ref 8–18)
Bicarbonate: 29 mEq/L (ref 24–31)
Bilirubin, Tot: 3 mg/dL — ABNORMAL HIGH (ref <1.2)
Calcium: 8.5 mg/dL — ABNORMAL LOW (ref 8.8–10.3)
Chloride: 101 mEq/L (ref 97–107)
Creatinine: 0.7 mg/dL (ref 0.5–1.5)
Glucose: 124 mg/dL — ABNORMAL HIGH (ref 65–110)
Potassium: 4.4 mEq/L (ref 3.5–5.0)
Sodium: 138 mEq/L (ref 135–145)
Total Protein: 7.5 gm/dL (ref 6.0–8.0)

## 2003-08-23 LAB — CBC WITH DIFF, BLOOD
Hct: 38.5 % (ref 36.0–46.0)
Hgb: 13.4 gm/dL (ref 12.0–16.0)
MCH: 34.2 pg — ABNORMAL HIGH (ref 27–31)
MCHC: 34.7 % (ref 32–37)
MCV: 98.6 um3 — ABNORMAL HIGH (ref 82.0–98.0)
MPV: 9.8 fL (ref 7.4–10.4)
Plt Count: 43 10*3/uL — ABNORMAL LOW (ref 130–400)
RBC: 3.91 10*6/uL — ABNORMAL LOW (ref 4.00–5.00)
RDW: 15.3 % — ABNORMAL HIGH (ref 10–15)
WBC: 2.4 10*3/uL — ABNORMAL LOW (ref 4.0–11.0)

## 2003-08-23 LAB — PROTHROMBIN TIME, BLOOD
INR: 1.4
Protime, Control: 10 s
Protime: 13.8 s — ABNORMAL HIGH (ref 9–12)

## 2003-08-23 LAB — APTT, BLOOD
PTT, Control: 26.7 s
PTT: 34.6 s — ABNORMAL HIGH (ref 25.0–33.0)

## 2003-08-29 LAB — HEPATITIS C RNA, QUANT BLOOD: Hepatitis C RNA Quant: 33500 IU/ml

## 2003-08-30 LAB — CBC WITH DIFF, BLOOD
Hct: 37.1 % (ref 36.0–46.0)
Hgb: 12.5 gm/dL (ref 12.0–16.0)
MCH: 34 pg — ABNORMAL HIGH (ref 27–31)
MCHC: 33.6 % (ref 32–37)
MCV: 101 um3 — ABNORMAL HIGH (ref 82.0–98.0)
MPV: 8.5 fL (ref 7.4–10.4)
Plt Count: 41 10*3/uL — ABNORMAL LOW (ref 130–400)
RBC: 3.67 10*6/uL — ABNORMAL LOW (ref 4.00–5.00)
RDW: 13.8 % (ref 10–15)
WBC: 2.1 10*3/uL — ABNORMAL LOW (ref 4.0–11.0)

## 2003-08-30 LAB — MDIFF
Bands: 14 % (ref 0–15)
Eosinophils: 1 % (ref 1–3)
Lymphocytes: 32 % (ref 20–40)
Monocytes: 9 % (ref 1–10)
Other: 2 % — ABNORMAL HIGH (ref 0–0)
Plt Est: LOW
Segs: 100 %
Segs: 42 % — ABNORMAL LOW (ref 45–70)

## 2003-08-30 LAB — PROTHROMBIN TIME, BLOOD
INR: 1.5
Protime, Control: 10 s
Protime: 14.3 s — ABNORMAL HIGH (ref 9–12)

## 2003-08-30 LAB — APTT, BLOOD
PTT, Control: 26.8 s
PTT: 34.9 s — ABNORMAL HIGH (ref 25.0–33.0)

## 2003-09-06 LAB — COMPREHENSIVE METABOLIC PANEL, BLOOD
ALT (SGPT): 34 IU/L (ref 10–45)
AST (SGOT): 48 IU/L — ABNORMAL HIGH (ref 10–45)
Albumin: 2.6 gm/dL — ABNORMAL LOW (ref 3.3–5.0)
Alkaline Phos: 92 IU/L (ref 30–130)
BUN: 8 mg/dL (ref 8–18)
Bicarbonate: 29 mEq/L (ref 24–31)
Bilirubin, Tot: 2.9 mg/dL — ABNORMAL HIGH (ref <1.2)
Calcium: 8.2 mg/dL — ABNORMAL LOW (ref 8.8–10.3)
Chloride: 104 mEq/L (ref 97–107)
Creatinine: 0.7 mg/dL (ref 0.5–1.5)
Glucose: 116 mg/dL — ABNORMAL HIGH (ref 65–110)
Potassium: 4.1 meq/L (ref 3.5–5.0)
Sodium: 139 meq/L (ref 135–145)
Total Protein: 6.7 gm/dL (ref 6.0–8.0)

## 2003-09-06 LAB — CBC WITH DIFF, BLOOD
Hct: 34.2 % — ABNORMAL LOW (ref 36.0–46.0)
Hgb: 11.9 gm/dL — ABNORMAL LOW (ref 12.0–16.0)
MCH: 34.5 pg — ABNORMAL HIGH (ref 27–31)
MCHC: 34.9 % (ref 32–37)
MCV: 99 um3 — ABNORMAL HIGH (ref 82.0–98.0)
MPV: 8.8 fL (ref 7.4–10.4)
Plt Count: 46 10*3/uL — ABNORMAL LOW (ref 130–400)
RBC: 3.46 10*6/uL — ABNORMAL LOW (ref 4.00–5.00)
RDW: 16.5 % — ABNORMAL HIGH (ref 10–15)
WBC: 1.7 10*3/uL — ABNORMAL LOW (ref 4.0–11.0)

## 2003-09-06 LAB — MDIFF
Bands: 14 % (ref 0–15)
Lymphocytes: 41 % — ABNORMAL HIGH (ref 20–40)
Monocytes: 7 % (ref 1–10)
Plt Est: LOW
Segs: 100 %
Segs: 38 % — ABNORMAL LOW (ref 45–70)

## 2003-09-06 LAB — LIVER PANEL, BLOOD: Bilirubin, Dir: 0.8 mg/dL — ABNORMAL HIGH (ref <0.2)

## 2003-09-06 LAB — APTT, BLOOD
PTT, Control: 26.7 s
PTT: 36.1 s — ABNORMAL HIGH (ref 25.0–33.0)

## 2003-09-06 LAB — HEPATITIS C RNA, QUANT BLOOD: Hepatitis C RNA Quant: 1030 IU/ml

## 2003-09-06 LAB — PROTHROMBIN TIME, BLOOD
INR: 1.5
Protime, Control: 10 s
Protime: 14.3 s — ABNORMAL HIGH (ref 9–12)

## 2003-09-06 LAB — ALPHA FETOPROTEIN, SERUM: AFP: 3 ng/mL (ref <8)

## 2003-09-15 LAB — HEPATITIS C GENOTYPING, BLOOD

## 2003-09-15 LAB — HEPATITIS C RNA, QUANT BLOOD: Hepatitis C RNA Quant: <600 IU/ml

## 2003-09-18 LAB — COMPREHENSIVE METABOLIC PANEL, BLOOD
ALT (SGPT): 27 IU/L (ref 10–45)
AST (SGOT): 41 IU/L (ref 10–45)
Albumin: 2.6 gm/dL — ABNORMAL LOW (ref 3.3–5.0)
Alkaline Phos: 96 IU/L (ref 30–130)
BUN: 7 mg/dL — ABNORMAL LOW (ref 8–18)
Bicarbonate: 30 meq/L (ref 24–31)
Bilirubin, Tot: 2.2 mg/dL — ABNORMAL HIGH (ref <1.2)
Calcium: 8 mg/dL — ABNORMAL LOW (ref 8.8–10.3)
Chloride: 101 mEq/L (ref 97–107)
Creatinine: 0.6 mg/dL (ref 0.5–1.5)
Glucose: 152 mg/dL — ABNORMAL HIGH (ref 65–110)
Potassium: 4.6 mEq/L (ref 3.5–5.0)
Sodium: 137 mEq/L (ref 135–145)
Total Protein: 7.1 gm/dL (ref 6.0–8.0)

## 2003-09-18 LAB — CBC WITH DIFF, BLOOD
Basophils: 0 % (ref 0–2)
Eosinophils: 0 % — ABNORMAL LOW (ref 1–3)
Hct: 35.5 % — ABNORMAL LOW (ref 36.0–46.0)
Hgb: 11.9 gm/dL — ABNORMAL LOW (ref 12.0–16.0)
Lymphocytes: 40 % (ref 20–40)
MCH: 34.7 pg — ABNORMAL HIGH (ref 27–31)
MCHC: 33.5 % (ref 32–37)
MCV: 104 um3 — ABNORMAL HIGH (ref 82.0–98.0)
MPV: 10.2 fL (ref 7.4–10.4)
Monocytes: 11 % — ABNORMAL HIGH (ref 1–10)
Plt Count: 36 10*3/uL — ABNORMAL LOW (ref 130–400)
RBC: 3.43 10*6/uL — ABNORMAL LOW (ref 4.00–5.00)
RDW: 15.1 % — ABNORMAL HIGH (ref 10–15)
Segs: 48 % (ref 45–70)
WBC: 1.6 10*3/uL — ABNORMAL LOW (ref 4.0–11.0)

## 2003-09-18 LAB — APTT, BLOOD
PTT, Control: 27 s
PTT: 35.7 s — ABNORMAL HIGH (ref 25.0–33.0)

## 2003-09-18 LAB — PROTHROMBIN TIME, BLOOD
INR: 1.4
Protime, Control: 10 s
Protime: 13.8 s — ABNORMAL HIGH (ref 9–12)

## 2003-09-18 LAB — ADIF: Plt Est: LOW

## 2003-09-18 LAB — ALPHA FETOPROTEIN, SERUM: AFP: <2 ng/mL (ref <8)

## 2003-09-18 LAB — LIVER PANEL, BLOOD: Bilirubin, Dir: 0.6 mg/dL — ABNORMAL HIGH (ref <0.2)

## 2003-09-27 LAB — CBC WITH DIFF, BLOOD
Basophils: 2 % (ref 0–2)
Eosinophils: 1 % (ref 1–3)
Hct: 36.4 % (ref 36.0–46.0)
Hgb: 12.1 gm/dL (ref 12.0–16.0)
Lymphocytes: 45 % — ABNORMAL HIGH (ref 20–40)
MCH: 35 pg — ABNORMAL HIGH (ref 27–31)
MCHC: 33.3 % (ref 32–37)
MCV: 105 um3 — ABNORMAL HIGH (ref 82.0–98.0)
MPV: 10.7 fL — ABNORMAL HIGH (ref 7.4–10.4)
Monocytes: 12 % — ABNORMAL HIGH (ref 1–10)
Plt Count: 39 10*3/uL — ABNORMAL LOW (ref 130–400)
RBC: 3.46 10*6/uL — ABNORMAL LOW (ref 4.00–5.00)
RDW: 15 % (ref 10–15)
Segs: 41 % — ABNORMAL LOW (ref 45–70)
WBC: 1.6 10*3/uL — ABNORMAL LOW (ref 4.0–11.0)

## 2003-09-27 LAB — PROTHROMBIN TIME, BLOOD
INR: 1.4
Protime, Control: 10 s
Protime: 14 s — ABNORMAL HIGH (ref 9–12)

## 2003-09-27 LAB — ADIF: Plt Est: LOW

## 2003-09-27 LAB — COMPREHENSIVE METABOLIC PANEL, BLOOD
ALT (SGPT): 27 IU/L (ref 10–45)
AST (SGOT): 46 IU/L — ABNORMAL HIGH (ref 10–45)
Albumin: 2.6 gm/dL — ABNORMAL LOW (ref 3.3–5.0)
Alkaline Phos: 88 IU/L (ref 30–130)
BUN: 8 mg/dL (ref 8–18)
Bicarbonate: 26 mEq/L (ref 24–31)
Bilirubin, Tot: 2.2 mg/dL — ABNORMAL HIGH (ref <1.2)
Calcium: 8.1 mg/dL — ABNORMAL LOW (ref 8.8–10.3)
Chloride: 104 mEq/L (ref 97–107)
Creatinine: 0.7 mg/dL (ref 0.5–1.5)
Glucose: 115 mg/dL — ABNORMAL HIGH (ref 65–110)
Potassium: 4.3 mEq/L (ref 3.5–5.0)
Sodium: 139 mEq/L (ref 135–145)
Total Protein: 6.9 gm/dL (ref 6.0–8.0)

## 2003-09-27 LAB — APTT, BLOOD
PTT, Control: 27 s
PTT: 38.9 s — ABNORMAL HIGH (ref 25.0–33.0)

## 2003-09-27 LAB — LIVER PANEL, BLOOD: Bilirubin, Dir: 0.7 mg/dL — ABNORMAL HIGH (ref <0.2)

## 2003-10-18 ENCOUNTER — Other Ambulatory Visit (HOSPITAL_BASED_OUTPATIENT_CLINIC_OR_DEPARTMENT_OTHER): Payer: Self-pay | Admitting: Family

## 2003-10-18 LAB — CBC WITH DIFF, BLOOD
Basophils: 0 % (ref 0–2)
Eosinophils: 1 % (ref 1–3)
Hct: 34.6 % — ABNORMAL LOW (ref 36.0–46.0)
Hgb: 11.6 g/dL — ABNORMAL LOW (ref 12.0–16.0)
Lymphocytes: 40 % (ref 20–40)
MCH: 35.5 pg — ABNORMAL HIGH (ref 27–31)
MCHC: 33.6 % (ref 32–37)
MCV: 105 um3 — ABNORMAL HIGH (ref 82.0–98.0)
MPV: 11.3 fL — ABNORMAL HIGH (ref 7.4–10.4)
Monocytes: 11 % — ABNORMAL HIGH (ref 1–10)
Plt Count: 39 10*3/uL — ABNORMAL LOW (ref 130–400)
RBC: 3.28 10*6/uL — ABNORMAL LOW (ref 4.00–5.00)
RDW: 14.4 % (ref 10–15)
Segs: 48 % (ref 45–70)
WBC: 1.4 10*3/uL — ABNORMAL LOW (ref 4.0–11.0)

## 2003-10-18 LAB — ADIF: Plt Est: LOW

## 2003-10-18 LAB — LIVER PANEL, BLOOD: Bilirubin, Dir: 0.7 mg/dL — ABNORMAL HIGH (ref <0.2)

## 2003-10-18 LAB — COMPREHENSIVE METABOLIC PANEL, BLOOD
ALT (SGPT): 26 [IU]/L (ref 10–45)
AST (SGOT): 43 [IU]/L (ref 10–45)
Albumin: 2.5 g/dL — ABNORMAL LOW (ref 3.3–5.0)
Alkaline Phos: 105 [IU]/L (ref 30–130)
BUN: 5 mg/dL — ABNORMAL LOW (ref 8–18)
Bicarbonate: 28 meq/L (ref 24–31)
Bilirubin, Tot: 2.2 mg/dL — ABNORMAL HIGH (ref <1.2)
Calcium: 8.3 mg/dL — ABNORMAL LOW (ref 8.8–10.3)
Chloride: 102 meq/L (ref 97–107)
Creatinine: 0.7 mg/dL (ref 0.5–1.5)
Glucose: 142 mg/dL — ABNORMAL HIGH (ref 65–110)
Potassium: 4.4 meq/L (ref 3.5–5.0)
Sodium: 138 meq/L (ref 135–145)
Total Protein: 7.1 g/dL (ref 6.0–8.0)

## 2003-10-18 LAB — PROTHROMBIN TIME, BLOOD
INR: 1.3
Protime, Control: 10 s
Protime: 13.2 s — ABNORMAL HIGH (ref 9–12)

## 2003-10-18 LAB — ALPHA FETOPROTEIN, SERUM: AFP: 3 ng/mL (ref <8)

## 2003-10-18 LAB — APTT, BLOOD
PTT, Control: 26.5 s
PTT: 37.8 s — ABNORMAL HIGH (ref 25.0–33.0)

## 2003-10-23 LAB — HEPATITIS C RNA, QUANT BLOOD: Hepatitis C RNA Quant: <600 IU/ml

## 2003-10-27 LAB — PIVKA2, BLOOD: PIVKA: 2.8 ng/mL (ref 0.0–3.5)

## 2003-11-08 ENCOUNTER — Other Ambulatory Visit (INDEPENDENT_AMBULATORY_CARE_PROVIDER_SITE_OTHER): Payer: Self-pay | Admitting: Hepatology

## 2003-11-08 LAB — CBC WITH DIFF, BLOOD
Hct: 33.9 % — ABNORMAL LOW (ref 36.0–46.0)
Hgb: 11.1 gm/dL — ABNORMAL LOW (ref 12.0–16.0)
MCH: 34.9 pg — ABNORMAL HIGH (ref 27–31)
MCHC: 32.9 % (ref 32–37)
MCV: 106 um3 — ABNORMAL HIGH (ref 82.0–98.0)
MPV: 11.3 fl — ABNORMAL HIGH (ref 7.4–10.4)
Plt Count: 43 10*3/uL — ABNORMAL LOW (ref 130–400)
RBC: 3.19 10*6/uL — ABNORMAL LOW (ref 4.00–5.00)
RDW: 14.7 % (ref 10–15)
WBC: 1.3 10*3/uL — ABNORMAL LOW (ref 4.0–11.0)

## 2003-11-08 LAB — MDIFF
Bands: 11 % (ref 0–15)
Basophils: 1 % (ref 0–2)
Lymphocytes: 32 % (ref 20–40)
Metamyelocytes: 1 % — ABNORMAL HIGH (ref 0–0)
Monocytes: 12 % — ABNORMAL HIGH (ref 1–10)
Plt Est: LOW
Reactive Lymphs: 1 % — ABNORMAL HIGH (ref 0–0)
Segs: 100 %
Segs: 42 % — ABNORMAL LOW (ref 45–70)

## 2003-11-08 LAB — COMPREHENSIVE METABOLIC PANEL, BLOOD
ALT (SGPT): 26 IU/L (ref 10–45)
AST (SGOT): 43 IU/L (ref 10–45)
Albumin: 2.5 gm/dL — ABNORMAL LOW (ref 3.3–5.0)
Alkaline Phos: 97 IU/L (ref 30–130)
BUN: 7 mg/dL — ABNORMAL LOW (ref 8–18)
Bicarbonate: 29 meq/L (ref 24–31)
Bilirubin, Tot: 2.5 mg/dL — ABNORMAL HIGH (ref <1.2)
Calcium: 8.2 mg/dL — ABNORMAL LOW (ref 8.8–10.3)
Chloride: 103 mEq/L (ref 97–107)
Creatinine: 0.7 mg/dL (ref 0.5–1.5)
Glucose: 158 mg/dL — ABNORMAL HIGH (ref 65–110)
Potassium: 4.3 meq/L (ref 3.5–5.0)
Sodium: 138 meq/L (ref 135–145)
Total Protein: 7.2 gm/dL (ref 6.0–8.0)

## 2003-11-08 LAB — ALPHA FETOPROTEIN, SERUM: AFP: 4 ng/mL (ref <8)

## 2003-11-08 LAB — LIVER PANEL, BLOOD: Bilirubin, Dir: 0.7 mg/dL — ABNORMAL HIGH (ref <0.2)

## 2003-11-08 LAB — APTT, BLOOD
PTT, Control: 25.4 s
PTT: 34.6 s — ABNORMAL HIGH (ref 25.0–33.0)

## 2003-11-08 LAB — PROTHROMBIN TIME, BLOOD
INR: 1.4
Protime, Control: 10 s
Protime: 13.8 s — ABNORMAL HIGH (ref 9–12)

## 2003-11-10 LAB — HEPATITIS C RNA, QUANT BLOOD: Hepatitis C RNA Quant: <600 IU/ml

## 2003-12-04 LAB — LIVER PANEL, BLOOD: Bilirubin, Dir: 0.6 mg/dL — ABNORMAL HIGH (ref <0.2)

## 2003-12-04 LAB — COMPREHENSIVE METABOLIC PANEL, BLOOD
ALT (SGPT): 27 IU/L (ref 10–45)
AST (SGOT): 44 IU/L (ref 10–45)
Albumin: 2.5 gm/dL — ABNORMAL LOW (ref 3.3–5.0)
Alkaline Phos: 86 IU/L (ref 30–130)
BUN: 6 mg/dL — ABNORMAL LOW (ref 8–18)
Bicarbonate: 25 mEq/L (ref 24–31)
Bilirubin, Tot: 2.4 mg/dL — ABNORMAL HIGH (ref <1.2)
Calcium: 7.9 mg/dL — ABNORMAL LOW (ref 8.8–10.3)
Chloride: 107 mEq/L (ref 97–107)
Creatinine: 0.6 mg/dL (ref 0.5–1.5)
Glucose: 219 mg/dL — ABNORMAL HIGH (ref 65–110)
Potassium: 3.9 meq/L (ref 3.5–5.0)
Sodium: 139 meq/L (ref 135–145)
Total Protein: 6.6 gm/dL (ref 6.0–8.0)

## 2003-12-04 LAB — PROTHROMBIN TIME, BLOOD
INR: 1.4
Protime, Control: 10 s
Protime: 13.4 s — ABNORMAL HIGH (ref 9–12)

## 2003-12-04 LAB — CBC WITH DIFF, BLOOD
Basophils: 2 % (ref 0–2)
Eosinophils: 0 % — ABNORMAL LOW (ref 1–3)
Hct: 32.7 % — ABNORMAL LOW (ref 36.0–46.0)
Hgb: 11 gm/dL — ABNORMAL LOW (ref 12.0–16.0)
Lymphocytes: 47 % — ABNORMAL HIGH (ref 20–40)
MCH: 35.8 pg — ABNORMAL HIGH (ref 27–31)
MCHC: 33.7 % (ref 32–37)
MCV: 106 um3 — ABNORMAL HIGH (ref 82.0–98.0)
MPV: 11.2 fL — ABNORMAL HIGH (ref 7.4–10.4)
Monocytes: 10 % (ref 1–10)
Plt Count: 33 10*3/uL — ABNORMAL LOW (ref 130–400)
RBC: 3.08 10*6/uL — ABNORMAL LOW (ref 4.00–5.00)
RDW: 14 % (ref 10–15)
Segs: 41 % — ABNORMAL LOW (ref 45–70)
WBC: 1.2 10*3/uL — ABNORMAL LOW (ref 4.0–11.0)

## 2003-12-04 LAB — ADIF: Plt Est: LOW

## 2003-12-04 LAB — ALPHA FETOPROTEIN, SERUM: AFP: 3 ng/mL (ref <8)

## 2003-12-04 LAB — APTT, BLOOD
PTT, Control: 27.7 s
PTT: 35.6 s — ABNORMAL HIGH (ref 25.0–33.0)

## 2003-12-14 LAB — PIVKA2, BLOOD: PIVKA: 2.9 ng/mL (ref 0.0–3.5)

## 2003-12-25 ENCOUNTER — Other Ambulatory Visit (INDEPENDENT_AMBULATORY_CARE_PROVIDER_SITE_OTHER): Payer: Self-pay | Admitting: Hepatology

## 2004-01-01 LAB — COMPREHENSIVE METABOLIC PANEL, BLOOD
ALT (SGPT): 33 IU/L (ref 10–45)
AST (SGOT): 53 IU/L — ABNORMAL HIGH (ref 10–45)
Albumin: 2.5 gm/dL — ABNORMAL LOW (ref 3.3–5.0)
Alkaline Phos: 90 IU/L (ref 30–130)
BUN: 9 mg/dL (ref 8–18)
Bicarbonate: 25 mEq/L (ref 24–31)
Bilirubin, Tot: 1.7 mg/dL — ABNORMAL HIGH (ref <1.2)
Calcium: 7.8 mg/dL — ABNORMAL LOW (ref 8.8–10.3)
Chloride: 100 mEq/L (ref 97–107)
Creatinine: 0.6 mg/dL (ref 0.5–1.5)
Glucose: 193 mg/dL — ABNORMAL HIGH (ref 65–110)
Potassium: 3.8 mEq/L (ref 3.5–5.0)
Sodium: 136 mEq/L (ref 135–145)
Total Protein: 7.3 gm/dL (ref 6.0–8.0)

## 2004-01-01 LAB — CBC WITH DIFF, BLOOD
Basophils: 1 % (ref 0–2)
Eosinophils: 0 % — ABNORMAL LOW (ref 1–3)
Hct: 31.4 % — ABNORMAL LOW (ref 36.0–46.0)
Hgb: 10.7 gm/dL — ABNORMAL LOW (ref 12.0–16.0)
Lymphocytes: 46 % — ABNORMAL HIGH (ref 20–40)
MCH: 35.3 pg — ABNORMAL HIGH (ref 27–31)
MCHC: 34 % (ref 32–37)
MCV: 104 um3 — ABNORMAL HIGH (ref 82.0–98.0)
MPV: 11.3 fL — ABNORMAL HIGH (ref 7.4–10.4)
Monocytes: 16 % — ABNORMAL HIGH (ref 1–10)
Plt Count: 41 10*3/uL — ABNORMAL LOW (ref 130–400)
RBC: 3.02 10*6/uL — ABNORMAL LOW (ref 4.00–5.00)
RDW: 15.5 % — ABNORMAL HIGH (ref 10–15)
Segs: 37 % — ABNORMAL LOW (ref 45–70)
WBC: 1.1 10*3/uL — ABNORMAL LOW (ref 4.0–11.0)

## 2004-01-01 LAB — PROTHROMBIN TIME, BLOOD
INR: 1.4
Protime, Control: 10 s
Protime: 13.6 s — ABNORMAL HIGH (ref 9–12)

## 2004-01-01 LAB — ALPHA FETOPROTEIN, SERUM: AFP: 3 ng/mL (ref <8)

## 2004-01-01 LAB — ADIF: Plt Est: LOW

## 2004-01-01 LAB — LIVER PANEL, BLOOD: Bilirubin, Dir: 0.6 mg/dL — ABNORMAL HIGH (ref <0.2)

## 2004-01-01 LAB — APTT, BLOOD
PTT, Control: 26.5 s
PTT: 36.4 s — ABNORMAL HIGH (ref 25.0–33.0)

## 2004-01-03 LAB — HEPATITIS C RNA, QUANT BLOOD: Hepatitis C RNA Quant: <600 IU/ml

## 2004-01-29 ENCOUNTER — Other Ambulatory Visit: Payer: Self-pay | Admitting: Nurse Practitioner

## 2004-02-01 ENCOUNTER — Ambulatory Visit (HOSPITAL_BASED_OUTPATIENT_CLINIC_OR_DEPARTMENT_OTHER)

## 2004-03-01 ENCOUNTER — Other Ambulatory Visit: Payer: Self-pay | Admitting: Nurse Practitioner

## 2004-03-07 ENCOUNTER — Ambulatory Visit (HOSPITAL_BASED_OUTPATIENT_CLINIC_OR_DEPARTMENT_OTHER)

## 2004-03-26 ENCOUNTER — Other Ambulatory Visit (INDEPENDENT_AMBULATORY_CARE_PROVIDER_SITE_OTHER): Payer: Self-pay | Admitting: Hepatology

## 2004-04-04 ENCOUNTER — Other Ambulatory Visit: Payer: Self-pay | Admitting: Nurse Practitioner

## 2004-04-05 ENCOUNTER — Encounter (HOSPITAL_BASED_OUTPATIENT_CLINIC_OR_DEPARTMENT_OTHER)

## 2004-04-11 ENCOUNTER — Ambulatory Visit (HOSPITAL_BASED_OUTPATIENT_CLINIC_OR_DEPARTMENT_OTHER)

## 2004-04-12 ENCOUNTER — Ambulatory Visit (HOSPITAL_BASED_OUTPATIENT_CLINIC_OR_DEPARTMENT_OTHER)

## 2004-04-19 ENCOUNTER — Other Ambulatory Visit (INDEPENDENT_AMBULATORY_CARE_PROVIDER_SITE_OTHER): Payer: Self-pay | Admitting: Hepatology

## 2004-04-21 ENCOUNTER — Encounter (HOSPITAL_BASED_OUTPATIENT_CLINIC_OR_DEPARTMENT_OTHER): Payer: Self-pay | Admitting: Family

## 2004-05-02 ENCOUNTER — Encounter (HOSPITAL_BASED_OUTPATIENT_CLINIC_OR_DEPARTMENT_OTHER): Admitting: Optometrist

## 2004-05-09 ENCOUNTER — Other Ambulatory Visit: Payer: Self-pay | Admitting: Nurse Practitioner

## 2004-05-16 ENCOUNTER — Encounter (HOSPITAL_BASED_OUTPATIENT_CLINIC_OR_DEPARTMENT_OTHER): Admitting: Optometrist

## 2004-05-16 ENCOUNTER — Ambulatory Visit (HOSPITAL_BASED_OUTPATIENT_CLINIC_OR_DEPARTMENT_OTHER)

## 2004-05-16 ENCOUNTER — Ambulatory Visit (INDEPENDENT_AMBULATORY_CARE_PROVIDER_SITE_OTHER): Admitting: Gastroenterology

## 2004-05-19 NOTE — Progress Notes (Signed)
 CLINIC: [facility]      REPORT TYPE: CONSULT      Dictating Practitioner: Joselyn Nicely. Orion Birks, M.D.    DATE OF SERVICE: 05/16/2004    REASON FOR VISIT: PANCREATIC CYSTIC LESIONS            REFERRING PHYSICIAN: Merwyn Achilles, M.D.    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 As part of her routine screening imaging studies, she has  been noted to have stable cystic lesions within her pancreas. Her last  imaging study of this was 03/26/2004 which revealed cystic lesions within  the head and uncinate process of the pancreas that appeared to have  increased in size when compared to examination of 2004. Their appearance  would suggest either a gradually enlarging pseudocyst or possibly an  intraductal papillary mucinous tumor. They had recommended the patient be  referred for an endoscopic retrograde cholangiopancreatography.    The patient denies any history of pancreatic problems. She denies any  history of heavy alcohol abuse. She does have a family history of a sister  who died of pancreatic cancer at age 58.    In general, she has no abdominal pain although over the past couple of days  she has had a little bit of epigastric pain.    PAST MEDICAL HISTORY  1. Hepatitis C: The last viral load was negative.    2. History of hospitalization for decompensated liver disease on  06/30/2003. At that time, she was treated for cirrhosis, spontaneous  bacterial peritonitis, right hepatic hydrothorax and varices.    3. History of esophageal varices status post banding in the past.    4. Cholecystectomy.    5. Cesarean section.    6. Vaginal sling.    7. Diabetes.    CURRENT MEDICATIONS: The patient does not know her medications but looking  through the chart, it seems she is taking a oral hypoglycemic, a beta  blocker, diuretics which probably include Aldactone  and Lasix , as well  as  lactulose .    ALLERGIES: NO KNOWN DRUG ALLERGIES.    HABITS: The patient does not smoke or drink.    FAMILY HISTORY: The patient's sister died of pancreatic cancer at age 43.    SOCIAL HISTORY: The patient is here today with her granddaughter. She  lives with her husband in [place].    REVIEW OF SYSTEMS: She says that her weight has been stable. She has one  bowel movement per day, but sometimes more when she takes more lactulose .  The remainder of the review of systems is unremarkable and  noncontributory.    PHYSICAL EXAMINATION: VITAL SIGNS: Weight 122 pounds, blood pressure  100/54, pulse rate 58, pain seven/ten in the epigastric area. GENERAL:  She is a well-developed, well-nourished woman in no acute distress. The  patient is anicteric. LUNGS: Clear. HEART: Regular rate and rhythm.  ABDOMEN: Soft, nontender, positive bowel sounds, no hepatosplenomegaly.  EXTREMITIES: No edema. NEUROLOGIC EXAMINATION: The patient is alert and  oriented and is nonfocal on examination.    LABORATORY STUDIES: From 05/09/2004, total bilirubin 1.7, ALT 25, AST 45.  Hepatitis C RNA nondetectable. CA19-9 87.    IMPRESSION: The patient is a 63 year old woman with compensated end-stage  liver disease due to hepatitis C who has been followed for several years  with imaging studies and has been noted to have pancreatic cystic lesions.  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 I do not think an ERCP would be warranted in this patient given  that the risks of pancreatitis are not justified by the limited amount of  information the study will yield diagnostically. Therefore, I think the  best course of action at this point would be an endoscopic ultrasound to  further evaluate the pancreas. This should help us  distinguish if there is  an obvious pancreatic mass lesion and whether or not this seems to be more  suggestive  of just benign cyst versus an intraductal papillary mucinous  neoplasm of the pancreas. Given the patient's end-stage liver disease, she  really is not a candidate for an elective surgery for cystic disease of the  pancreas and, as such, we will mostly just be focusing on whether or not  she has an obvious suspicious mass for malignancy.    I have explained to the patient and her granddaughter the risks, benefits  and alternatives of endoscopic ultrasound and they would like to proceed.    PLAN: Upper endoscopy with endoscopic ultrasound sometime in the next few  weeks.                        Signature Derived From Controlled Access Password  Joselyn Nicely. Orion Birks, M.D. 05/19/2004 19:55        DD: 05/16/2004 DT: 05/17/2004 5:17 A DocNo.: [id number]  [id number]      Primary Care Physician:  UNKNOWN     PCP_Address2      cc: Amaryllis Junior. Jennifer Moellers, M.D.   Dept Of Medicine/gi

## 2004-05-28 ENCOUNTER — Inpatient Hospital Stay (HOSPITAL_COMMUNITY): Admitting: Internal Medicine

## 2004-05-28 ENCOUNTER — Other Ambulatory Visit (INDEPENDENT_AMBULATORY_CARE_PROVIDER_SITE_OTHER): Payer: Self-pay | Admitting: Internal Medicine

## 2004-05-28 ENCOUNTER — Other Ambulatory Visit (INDEPENDENT_AMBULATORY_CARE_PROVIDER_SITE_OTHER): Payer: Self-pay | Admitting: Family Medicine

## 2004-05-28 ENCOUNTER — Other Ambulatory Visit: Payer: Self-pay

## 2004-05-28 ENCOUNTER — Other Ambulatory Visit: Payer: Self-pay | Admitting: Ophthalmology

## 2004-05-28 LAB — BODY FLUID CELL CT / DIFF
Diff Total: 100 %
Fluid RBC mm3: 480 uL
Fluid WBC mm3: 210 uL
Lymphocytes: 29 %
Macrophages: 66 %
Mesothelial: 4 %
Segs: 1 %

## 2004-05-28 LAB — CPK-CREATINE PHOSPHOKINASE, BLOOD: CPK: 64 [IU]/L (ref 0–175)

## 2004-05-28 LAB — AMYLASE, OTHER: Amylase, Other: 21 U/L

## 2004-05-28 LAB — TOTAL PROTEIN, OTHER: Total Protein, Other: 0.6 g/dL

## 2004-05-28 LAB — ALBUMIN, OTHER: Albumin, Other: <1 g/dL

## 2004-05-28 LAB — PH, OTHER: Ph, Other: 7.52

## 2004-05-28 LAB — TROPONIN I, BLOOD: Troponin I (HC): <0.2 ng/mL (ref <0.6)

## 2004-05-28 LAB — GLUCOSE, OTHER: Glucose, Other: 125 mg/dL

## 2004-05-28 LAB — CKMB+INDEX (NO CPK), BLOOD
CK-MB Index: INVALID % (ref <2.5)
CK-MB: 0.5 ng/mL (ref <5.0)

## 2004-05-28 LAB — STOOL OCCULT BLOOD, SINGLE SAMPLE: Occult Blood, Guaiac: POSITIVE — ABNORMAL HIGH

## 2004-05-28 LAB — LDH, OTHER: LDH, Other: 41 [IU]/L

## 2004-05-28 NOTE — ED Notes (Signed)
TIME OF NOTE WRITTEN- N/A      CHIEF COMPLAINT- N/A        HISTORY OF PRESENT ILLNESS  10/18 0828 Paula Dimmer, MD Attending     Pt is a 63 yr old woman who reports chest pressure and sob   that started yesterday morning and persisted. no radiation   of the pain. Pain feels like a weight onthe chest. no leg   swelling (better than usual). + dry cough. + sweats but no   clear fevers. no abd pain. distension of abd not too bad. no   new rashes. no dysuria or hematuria. +nausea, no change in   jaundice      PAST MEDICAL/SURGICAL HISTORY  10/18 1914 Paula Dimmer, MD Attending     cirrhosis, currently undergoing treatment but does not   remember meds.    10/18 7829 Paula Dimmer, MD Attending     +diabetes      FAMILY HISTORY- N/A   SOCIAL HISTORY- nonsmoker   OTHER- N/A   REVIEW OF SYSTEMS- Review of systems significant for:    No fever, No sweats, No sinus pain, No sore throat, No difficulty    swallowing, No dysuria, No hematuria, No rashes, No eruptions, No    jaundice, No joint pain, No muscle pain, No SOB, No cough, No abdominal    pain, No diarrhea  PERTINENT POSITIVES-   N/A     PHYSICAL EXAM  10/18 5621 Paula Dimmer, MD Attending     Awake, alert, cooperative   Afebrile, anicteric   Pulse ox: 95% ->normal   Skins: warm, dry   Heent; anicteric, nares clear, op-mmm with no erythema or    exudates,    Neck: supple with no adenopathy   Chest: coarse breath sounds in bases   Cor: rrr   Abd: soft with no tend to palp, active bs, no rebound or   guarding   Ext: no edema, good perfusion      IMPRESSION  10/18 0853 Paula Dimmer, MD Attending     chest pressure/sob in patient with undelrying cirrhosis and   diabetes      MEDICAL DECISION MAKING  10/18 0853 Paula Dimmer, MD Attending     12 lead ekg   set of cardiac markers   labs   CXR    CASE PRESENTED TO- Paula Manning     ============================= PHYSICIAN NOTES =============================    10/18 0746 Paula Dimmer, MD  Attending     12 lead ekg: sinus rhythm at 73 b/min, no acute st segment   changes, prolonged qt interval with qtc of 508    10/18 0922 Paula Dimmer, MD Attending     CXR ED prelim; inc right pleural effusion    10/18 1210 Paula Dimmer, MD Attending     Spoke with Liver team regarding pt. Has elevated white cell   count from baseline and marked bandemia.    will admit to medicine    10/18 1744 Soyla Dryer, MD     thoracentesis done by Dr. Walden Field. 700cc removed. Procedure   note in paper chart      ============================= PROCEDURE NOTES =============================      ================================ LAB NOTES ================================    10/18 0823 Paula Dimmer, MD Attending     slight elevation in ast   troponin negative    10/18 0854 Paula Dimmer, MD Attending     no leukocytosis   hgb normal    10/18 1010 Paula Dimmer, MD Attending  bnp only 170    10/18 1356 Paula Dimmer, MD Attending     CXR results noted      ================================= IMAGES ==================================      ============================== MD DISCHARGE ===============================    DISCHARGE PHYSICIAN- Paula Manning   CHIEF COMPLAINT- N/A CASE PRESENTED TO- Paula Manning   CONDITION OF DISCHARGE- Stable   WAS THIS VISIT FOR A WORK RELATED ILLNESS OR INJURY- No      PRIMARY CARE PHYSICIAN- Paula Manning,Paula Manning   HAS PCP BEEN CONTACTED- No H&P NOTE WAS DICTATED- No   DISCHARGE DIAGNOSIS-    acute bandemia, right sided pleural effusion     DISCHARGE INSTRUCTIONS      ============================= FOLLOW UP NOTES =============================

## 2004-05-28 NOTE — ED Notes (Signed)
============================== ADMIT SUMMARY ==============================    RECEIVING NURSE - joselyn      ED NURSE -     +------------------------------- ALLERGIES -------------------------------+   NKDA    +-------------------------- ADMITTING DIAGNOSIS --------------------------+   acute bandemia, right sided pleural effusion    +--------------------------- ADMITTING SERVICE ---------------------------+   Admit -- Hospitalist/HC   Level of Care -- Floor    +------------------------ MOST RECENT VITAL SIGNS ------------------------+  BP - 120/45 PULSE - 69   RESPIRATIONS - 20 O2 SAT - 100   TEMPERATURE - 98.3 MODE - Oral   GCS TOTAL -   PAIN - PAIN QUALITY -   PAIN LOCATION -     +-------------------------------- FLUIDS ---------------------------------+  DATE TIME IV FLUID L/R LOCATION SIZE HUNG ABSORBED  ---------------------------------------------------------------------------    10/18 1709 Plasma Right Wrist 20 318 ml 0 ml   TOTAL IV: 0 ml    TOTAL OUTPUT: 0 ml TOTAL PO: 0 ml    +------------------------------ MEDICATIONS ------------------------------+  DATE TIME MEDICATION VERIFYING RN RN INIT  ---------------------------------------------------------------------------    10/18 1345 DIPHENHYDRAMINE 25 Milligrams LXK   PO-(TAB)  10/18 1345 ACETAMINOPHEN 325 Milligrams LXK   PO-(TAB)    +------------------------------- LABS DONE -------------------------------+  ACT- MD MD RN AP +INITIALS+  IVE DATE TIME TIME TIME TREATMENT ORDERS MD RN AP   ---------------------------------------------------------------------------     10/18 0731 0732 0733 COMP METABOLIC PANEL** SRW LXK MLS   Specimen Type: Blood   10/18 0731 0732 0734 CBC WITH DIFF** SRW LXK MLS   Specimen Type: Blood   10/18 0731 0732 0735 TROPONIN I, CK TOTAL, CK-MB SRW LXK MLS   Specimen Type: Blood   10/18 0732 0732 0735 PT**, PTT** SRW LXK MLS   Specimen Type: Blood   10/18 0854 0910 0911 BNP SRW LXK MLS   Specimen Type: Blood   10/18 1151 1238 1244  CULTURE, BLOOD SRW L MLS   Specimen Type: Blood   Specimen Site: right forearm   10/18 1151 1238 1244 CULTURE, BLOOD SRW L MLS   Specimen Type: Blood   Specimen Site: left forearm   10/18 1151 1239 1245 CLEAN CATCH URINALYSIS COMPLETE SRW L MLS   Specimen Type: Urine   10/18 1151 1239 1245 URINE CULTURE SRW L MLS   Specimen Type: Urine   10/18 1220 1239 1245 PHOSPHORUS, MAGNESIUM CLC L MLS   Specimen Type: Blood   10/18 1220 1241 1245 T&C CROSSMATCH, FRESH FROZEN PLASMA CLC LXK MLS   - 2 UNITS, PLATELET CONCENTRATE - 2   UNITS   Collect New Specimen   Specimen Type: Blood   10/18 1555 1602 1615 ALBUMIN, THORACENTESIS, AMYLASE, AMT LXK MLS   THORACENTESIS, BACTERIAL CULT+GRAM   STAIN, THORACENTESIS, CELL COUNT,   THORACENTESIS, GLUCOSE,   THORACENTESIS, LDH, THORACENTESIS,   PH, THORACENTESIS, TOTAL PROTEIN,   THORACENTESIS   Specimen Type: Thoracentesis  NO 10/18 1558 1602 CLEAN CATCH URINALYSIS COMPLETE AMT LXK    Specimen Type: Urine  NO 10/18 1558 URINE CULTURE AMT    Specimen Type: Urine  NO 10/18 1703 1705 1708 TROPONIN I, CK TOTAL, CK-MB AMT L MLS   Collect New Specimen   Specimen Type: Blood   10/18 1749 1752 1755 TROPONIN I, CK TOTAL, CK-MB CLC L MLS   Specimen Type: Blood    EKG DONE - YES    +---------------------------- PROCEDURE NOTES ----------------------------+    +------------------------ OTHER NURSING PROCEDURES -----------------------+     +--------------------------- PSYCHOSOCIAL NEEDS --------------------------+  +------------------------- BARRIERS TO LEARNING --------------------------+  ASSESSMENT- Assessment Done with Findings of:      BARRIERS-    Pain  SUPPORT PERSON-      SPECIAL CONSIDERATIONS-      ============================== TRIAGE RECORD ==============================    CHIEF COMPLAINT- Chest Pain    TIME OF ONSET- 1 day : +-STANDING-+ +--SEATED--+  TRIAGE CATEGORY- 2 : BP PULSE BP PULSE   ROOM- 4A : N/A/N/A N/A 114/49 76   MODE OF ARRIVAL- Car :   IN CUSTODY- No : TEMP MODE O2SAT  RESP LMP   PRIVATE MD- [place] : 98.3 Oral 95 16 N/A       :   WORK RELATED INJURY- No : +--GCS--+ +--PUPILS--+  RETURN IN 72 HOURS- None : E V M TOT L R RESPONSE  TRIAGE NURSE- Darshana Schaust : 4 5 6 15  X X PERRL     IS THIS VISIT RELATED TO ASSAULT OR DOMESTIC VIOLENCE- no   PAIN TYPE- V NOW- 5 TOLERABLE AT- 0 QUALITY- [place]      PAIN LOCATION- mid-epig RADIATES TO- none   LATEX ALLERGY FORM- No LATEX ALLERGY- No TETANUS- < 5 years   IMMUNIZATION- UTD PED HEIGHT- N/A WEIGHT- N/A KG  ADDITIONAL FORMS- No   +------------------------- CARE PRIOR TO ARRIVAL -------------------------+   none  +------------------------------- ALLERGIES -------------------------------+   NKDA  +------------------------------ MEDICATIONS ------------------------------+   unsure of names  +-------------------------- PAST MEDICAL HISTORY -------------------------+   Liver DX, DM  +---------------------------- CURRENT HISTORY ----------------------------+   Pt states she came into ED today r/t mid-epigastric pain that started   yesterday at 07:00, + SOB/dry cough/fever/chills, denies any N/V.    =========================== REASSESSMENT VITALS ===========================   R T M I   E E O N   S M D O2 PUPILS +---GCS----+ I  DATE TIME BP HR P P E SAT L R E V M TOT POSITION T  ---------------------------------------------------------------------------    10/18 0550 114/49 76 16 98.3 O 95 4 5 6 15  Seated DXS  10/18 0550 / Standing DXS  10/18 0955 112/48 73 16 97 Lying LXK  10/18 1145 110/40 71 18 Lying LXK  10/18 1300 113/40 72 18 98 Lying LXK  10/18 1536 122/43 76 16 97 Lying LXK  10/18 1550 113/49 70 18 98.5 O 99 Lying LXK  10/18 1605 120/48 71 20 98.4 100 Lying LXK  10/18 1615 109/40 71 22 98.3 O 100 Lying L   10/18 1630 112/43 70 20 98.3 O 100 Lying L   10/18 1707 112/43 99 21 98.3 O 99 Lying SAD  10/18 1730 120/45 69 20 98.3 O 100 Lying L      +-----------------PAIN-----------------+   Danna Hefty O O      DATE TIME E W L LOCATION  QUALITY RADIATES COMMENT      ---------------------------------------------------------------------------    10/18 0550 V 5 0 mid-epig Dull none Triage  10/18 0550 Triage  10/18 0955   10/18 1145   10/18 1300   10/18 1536   10/18 1550   10/18 1605   10/18 1615   10/18 1630   10/18 1707   10/18 1730     ============================= NURSE DISCHARGE =============================    DISCHARGE NURSE- Amy Matuszynski   DISPOSITION- Admitted WITH- Family   ACCOMPANIED BY- ED Tech   EQUIP W/TRANSPORT-    N/A   TIME OF DISPOSITION- 05/28/2004 1750 LEFT ED VIA- Jonni Sanger   TEANSFERRED  TO- N/A REASON- Level of care   ADMITTED TO- 6th Floor ROOM- 601      NURSE REPORT TO- joselyn REPORT TIME- 1745   BELONGINGS- With Patient ENVELOPE NUMBER- N/A   CONDITION ON DISCHARGE- Stable   AFTERCARE PROVIDED WITH- Verbal   WHAT AFTERCARE INSTRUCTIONS WERE GIVEN AND REVIEWED WITH PATIENT  AND/OR FAMILY?- (see EPIC instructions)   admitted   IN WHAT LANGUAGE WERE THESE GIVEN?- English OTHER: N/A   TRANSLATED BY- N/A OTHER: N/A   GCS- E: 4 V: 5 M: 6 TOTAL: 15   PAIN LEVEL UPON DISPOSITION- 2 OUT OF 10  EXPECTED OUTCOMES MET- Yes   WHAT MEDS WERE PROVIDED FROM DISCHARGE PYXIS?-    None   RX TO BE FILLED FOR- N/A   DID THE PATIENT OR RESPONSIBLE CARE PROVIDER UNDERSTAND THE FOLLOW UP  RECOMMENDATION?- Yes DISPOSITION BY- MD        ============================== POINT OF CARE ==============================    OCCULT BLOOD STOOL RESULTS   Norm results neg.  DATE TIME RESULTS DONE BY CONTROL POSTIVE CONTROL NEGATIVE     URINE PREGNANCY TEST   Norm results for non pregnant females neg.  DATE TIME RESULTS DONE BY     URINE DIP   Norm results - All neg. with pH 4.6 to 8.0 and urobili 0.1 to 1.0   LEUKO NI- PRO- GLU- URO-   DATE TIME CYTE TRITE PH TEIN COSE KETONES BILI BILI BLOOD BY   10/18 0806 n n 6 n n n 4 n tr KPR    FINGER STICK GLUCOSE   Norm results 60 to 110 mg/dl  DATE TIME RESULTS DONE BY     FINGER STICK HEMOGLOBIN   Norm results adult female  58 to 17 gm/dl  Norm results adult female 12 to 16 gm/dl  DATE TIME RESULTS DONE BY     ============================== MD NOTES H&P ===============================

## 2004-05-28 NOTE — ED Notes (Signed)
==============================   ATTENDING NOTE =============================    10/18 3086 Allie Dimmer, MD Attending     see notes

## 2004-05-28 NOTE — ED Notes (Signed)
================================= ORDERS ==================================    ACT- MD MD RN AP +INITIALS+  IVE DATE TIME TIME TIME TREATMENT ORDERS MD RN AP   ---------------------------------------------------------------------------     10/18 0721 0732 0728 EKG SRW LXK ara   10/18 0731 0732 0733 COMP METABOLIC PANEL** SRW LXK MLS   Specimen Type: Blood   10/18 0731 0732 0734 CBC WITH DIFF** SRW LXK MLS   Specimen Type: Blood   10/18 0731 0732 0735 TROPONIN I, CK TOTAL, CK-MB SRW LXK MLS   Specimen Type: Blood   10/18 0732 0732 0735 PT**, PTT** SRW LXK MLS   Specimen Type: Blood   10/18 0732 0732 0735 POC Urine Dip SRW LXK MLS   10/18 0732 0732 0735 CHEST PA & LATERAL, X-RAY SRW LXK MLS   Rad Indication: shortness of   breath, chest pressure   10/18 0732 0751 0735 monitor SRW LXK MLS   10/18 0732 0751 0735 saline lock SRW LXK MLS   10/18 0854 0910 0911 BNP SRW LXK MLS   Specimen Type: Blood   10/18 1151 1238 1244 CULTURE, BLOOD SRW L MLS   Specimen Type: Blood   Specimen Site: right forearm   10/18 1151 1238 1244 CULTURE, BLOOD SRW L MLS   Specimen Type: Blood   Specimen Site: left forearm   10/18 1151 1239 1245 CLEAN CATCH URINALYSIS COMPLETE SRW L MLS   Specimen Type: Urine   10/18 1151 1239 1245 URINE CULTURE SRW L MLS   Specimen Type: Urine   10/18 1209 1240 1245 Admit -- Hospitalist/HC SRW jf MLS   Level of Care -- Floor   10/18 1220 1239 1245 PHOSPHORUS, MAGNESIUM CLC L MLS   Specimen Type: Blood   10/18 1220 1241 1245 T&C CROSSMATCH, FRESH FROZEN PLASMA CLC LXK MLS   - 2 UNITS, PLATELET CONCENTRATE - 2   UNITS   Collect New Specimen   Specimen Type: Blood   10/18 1220 1313 1315 please transfuse FFP and platelets CLC LXK MLS   when available   10/18 1221 1313 1315 DIPHENHYDRAMINE 25 Milligrams CLC LXK MLS   PO-(TAB)   10/18 1221 1313 1315 ACETAMINOPHEN 325 Milligrams CLC LXK MLS   PO-(TAB)   10/18 1221 1313 1315 benadryl and tylenol premedication CLC LXK MLS   for transfusion, thanks   10/18 1227 1240 1245  CHEST DECUBITUS, X-RAY CLC jf MLS   Rad Indication: R pleural effusion   10/18 1346 1353 1354 1 pack platelet transfusion please CLC LXK ara  NO 10/18 1500 1507 1508 ALBUMIN 25 Milligrams IV AMT LXK MLS   10/18 1502 1507 1508 give albumin during the AMT LXK MLS   thoracentesis   10/18 1555 1602 1615 ALBUMIN, THORACENTESIS, AMYLASE, AMT LXK MLS   THORACENTESIS, BACTERIAL CULT+GRAM   STAIN, THORACENTESIS, CELL COUNT,   THORACENTESIS, GLUCOSE,   THORACENTESIS, LDH, THORACENTESIS,   PH, THORACENTESIS, TOTAL PROTEIN,   THORACENTESIS   Specimen Type: Thoracentesis  NO 10/18 1558 1602 CLEAN CATCH URINALYSIS COMPLETE AMT LXK    Specimen Type: Urine  NO 10/18 1558 URINE CULTURE AMT    Specimen Type: Urine  NO 10/18 1602 1602 1615 CANCELLED -- CLEAN CATCH URINALYSIS AMT LXK MLS   COMPLETE   Specimen Type: Urine  NO 10/18 1602 1602 1615 CANCELLED -- URINE CULTURE AMT LXK MLS   Specimen Type: Urine  NO 10/18 1609 1628 1616 CANCELLED -- ALBUMIN 25 Milligrams AMT LXK MLS   IV  NO 10/18 1609 1629 1624 ALBUMIN [id number] Milligrams  IV AMT LXK MLS  NO 10/18 1703 1705 1708 TROPONIN I, CK TOTAL, CK-MB AMT L MLS   Collect New Specimen   Specimen Type: Blood  NO 10/18 1744 1751 1746 CANCELLED -- ALBUMIN [id number] CLC L MLS   Milligrams IV  NO 10/18 1744 1752 1746 CANCELLED -- TROPONIN I, CK TOTAL, CLC L MLS   CK-MB   Collect New Specimen   Specimen Type: Blood   10/18 1747 1752 1752 CHEST PA & LATERAL, X-RAY CLC L MLS   Rad Indication: s/p thoracentesis   10/18 1749 1752 1755 TROPONIN I, CK TOTAL, CK-MB CLC L MLS   Specimen Type: Blood

## 2004-05-29 ENCOUNTER — Other Ambulatory Visit: Payer: Self-pay | Admitting: Internal Medicine

## 2004-05-29 LAB — MDIFF
Bands: 58 % — ABNORMAL HIGH (ref 0–15)
Diff Total: 100 %
Lymphocytes: 4 % — ABNORMAL LOW (ref 20–40)
Monocytes: 2 % (ref 1–10)
NRBC: 1 /100{WBCs} — ABNORMAL HIGH (ref 0–0)
Plt Est: LOW
Segs: 36 % — ABNORMAL LOW (ref 45–70)

## 2004-05-29 LAB — PROTHROMBIN TIME, BLOOD
INR: 1.4
Protime, Control: 9.7 s
Protime: 13.8 s — ABNORMAL HIGH (ref 9–12)

## 2004-05-29 LAB — CKMB+INDEX (NO CPK), BLOOD
CK-MB Index: INVALID % (ref <2.5)
CK-MB: 0.7 ng/mL (ref <5.0)

## 2004-05-29 LAB — MAGNESIUM, BLOOD: Magnesium: 2.1 mg/dL (ref 1.8–2.5)

## 2004-05-29 LAB — BASIC METABOLIC PANEL, BLOOD
BUN: 5 mg/dL — ABNORMAL LOW (ref 8–18)
Bicarbonate: 23 meq/L — ABNORMAL LOW (ref 24–31)
Calcium: 8.3 mg/dL — ABNORMAL LOW (ref 8.8–10.3)
Chloride: 105 meq/L (ref 97–107)
Creatinine: 0.7 mg/dL (ref 0.5–1.5)
Glucose: 120 mg/dL — ABNORMAL HIGH (ref 65–110)
Potassium: 3.9 meq/L (ref 3.5–5.0)
Sodium: 140 meq/L (ref 135–145)

## 2004-05-29 LAB — PHOSPHORUS, BLOOD: Phosphorous: 3.7 mg/dL (ref 2.5–4.5)

## 2004-05-29 LAB — CBC WITH DIFF, BLOOD
Hct: 37.7 % (ref 36.0–46.0)
Hgb: 12 g/dL (ref 12.0–16.0)
MCH: 36.9 pg — ABNORMAL HIGH (ref 27–31)
MCHC: 31.9 % — ABNORMAL LOW (ref 32–37)
MCV: 116 um3 — ABNORMAL HIGH (ref 82.0–98.0)
MPV: 10.8 fL — ABNORMAL HIGH (ref 7.4–10.4)
Plt Count: 46 10*3/uL — ABNORMAL LOW (ref 130–400)
RBC: 3.26 10*6/uL — ABNORMAL LOW (ref 4.00–5.00)
RDW: 15.5 % — ABNORMAL HIGH (ref 10–15)
WBC: 8.8 10*3/uL (ref 4.0–11.0)

## 2004-05-29 LAB — CPK-CREATINE PHOSPHOKINASE, BLOOD: CPK: 63 [IU]/L (ref 0–175)

## 2004-05-29 LAB — TSH SENSITIVE, BLOOD: TSH Sensitive: 1.33 u[IU]/mL (ref 0.35–5.50)

## 2004-05-29 LAB — SMEAR, WBC

## 2004-05-29 LAB — TROPONIN I, BLOOD: Troponin I (HC): <0.2 ng/mL (ref <0.6)

## 2004-05-29 NOTE — Interdisciplinary (Signed)
PERFORMED ON - 05/29/2004 11:00:18;   DONE BY - MINDLIN, OLEG;  PROCEDURE - EKG;   EKG PERFORMED: DONE;   EKG MACHINE #: [id number];   ADVERSE REACTIONS: NONE;

## 2004-05-30 ENCOUNTER — Encounter (HOSPITAL_BASED_OUTPATIENT_CLINIC_OR_DEPARTMENT_OTHER): Admitting: Optometrist

## 2004-05-30 LAB — PROTHROMBIN TIME, BLOOD
INR: 1.7
Protime, Control: 9.7 s
Protime: 16 s — ABNORMAL HIGH (ref 9–12)

## 2004-05-30 LAB — MDIFF
Bands: 33 % — ABNORMAL HIGH (ref 0–15)
Basophils: 1 % (ref 0–2)
Diff Total: 100 %
Eosinophils: 1 % (ref 1–3)
Lymphocytes: 10 % — ABNORMAL LOW (ref 20–40)
Monocytes: 6 % (ref 1–10)
Plt Est: LOW
Segs: 49 % (ref 45–70)

## 2004-05-30 LAB — HEMOGRAM, BLOOD
Hct: 31.4 % — ABNORMAL LOW (ref 36.0–46.0)
Hgb: 10 g/dL — ABNORMAL LOW (ref 12.0–16.0)
MCH: 37 pg — ABNORMAL HIGH (ref 27–31)
MCHC: 31.9 % — ABNORMAL LOW (ref 32–37)
MCV: 116 um3 — ABNORMAL HIGH (ref 82.0–98.0)
MPV: 10.8 fL — ABNORMAL HIGH (ref 7.4–10.4)
Plt Count: 38 10*3/uL — ABNORMAL LOW (ref 130–400)
RBC: 2.71 10*6/uL — ABNORMAL LOW (ref 4.00–5.00)
RDW: 15 % (ref 10–15)
WBC: 3.3 10*3/uL — ABNORMAL LOW (ref 4.0–11.0)

## 2004-05-30 LAB — CBC WITH DIFF, BLOOD
Hct: 28.6 % — ABNORMAL LOW (ref 36.0–46.0)
Hgb: 8.9 g/dL — ABNORMAL LOW (ref 12.0–16.0)
MCH: 36.3 pg — ABNORMAL HIGH (ref 27–31)
MCHC: 31.2 % — ABNORMAL LOW (ref 32–37)
MCV: 116 um3 — ABNORMAL HIGH (ref 82.0–98.0)
MPV: 10.1 fL (ref 7.4–10.4)
Plt Count: 31 10*3/uL — ABNORMAL LOW (ref 130–400)
RBC: 2.46 10*6/uL — ABNORMAL LOW (ref 4.00–5.00)
RDW: 15 % (ref 10–15)
WBC: 3.8 10*3/uL — ABNORMAL LOW (ref 4.0–11.0)

## 2004-05-30 LAB — BASIC METABOLIC PANEL, BLOOD
BUN: 9 mg/dL (ref 8–18)
Bicarbonate: 21 meq/L — ABNORMAL LOW (ref 24–31)
Calcium: 8.1 mg/dL — ABNORMAL LOW (ref 8.8–10.3)
Chloride: 106 meq/L (ref 97–107)
Creatinine: 0.7 mg/dL (ref 0.5–1.5)
Glucose: 75 mg/dL (ref 65–110)
Potassium: 3.6 meq/L (ref 3.5–5.0)
Sodium: 137 meq/L (ref 135–145)

## 2004-05-30 LAB — MAGNESIUM, BLOOD: Magnesium: 2 mg/dL (ref 1.8–2.5)

## 2004-05-30 LAB — PHOSPHORUS, BLOOD: Phosphorous: 2.4 mg/dL — ABNORMAL LOW (ref 2.5–4.5)

## 2004-05-30 NOTE — Interdisciplinary (Signed)
PERFORMED ON - 05/30/2004 12:35:33;   DONE BY - TACTAY, IRMA Q;  PROCEDURE - PDP EVALUATION;   ADMITTING DIAGNOSIS: R-PLEURAL EFFUSION;   NEURO STATUS: ALERT AND ORIENTED;   HEARTRATE: 69;   BREATH SOUNDS: DIM/CLEAR;   CHEST EXCURSION: SYMMETRICAL;   PRE-TITR FIO2 OR L/M: RA;   PRE-TITR SAO2: 87;   TITRATED FIO2 OR L/M: 2;   POST TITRATION SAO2: 95;   MD REQUEST #1: MDP PROTOCOL;   RCP RECOMMENDATION: #1: MDI PRN;   INDICATION: #1: B: PRN FOR SOB;   EVALUATION OUTCOME: #1: AS RCP RECOMMENDED;   MD REQUEST #2: O2 PROTOCOL;   RCP RECOMMENDATION: #2: O2 PROTOCOL;   INDICATION: #2: O: TO KEEP SA02 >92;   EVALUATION OUTCOME: #2: AS RCP RECOMMENDED;   ADVERSE REACTIONS: NONE;

## 2004-05-30 NOTE — Interdisciplinary (Signed)
PERFORMED ON - 05/30/2004 20:30:56;   DONE BY - ELGIN, HEDY R;  PROCEDURE - OXYGEN-LOW FLOW;   PROTOCOL DRIVEN: YES-MD INITIATED;   O2 DEVICE: CANNULA-NASAL;   LITERS/MIN: 2;   SKIN COLOR: NORMAL;   SATURATION: 93;   O2 LOW-ACTIV OUTCME: CONTINUE PRESENT REGIMEN;   ADVERSE REACTIONS: NONE;

## 2004-05-30 NOTE — Interdisciplinary (Signed)
PERFORMED ON - 05/30/2004 13:03:10;   DONE BY - TACTAY, IRMA Q;  PROCEDURE - OXYGEN-LOW FLOW;   PROTOCOL DRIVEN: YES-MD INITIATED;   NEURO STATUS: ALERT AND ORIENTED;   O2 DEVICE: CANNULA-NASAL;   LITERS/MIN: 2;   SKIN COLOR: NORMAL;   SATURATION: 95;   ROOM AIR SAT: 87;   O2 LOW-ACTIV OUTCME: CONTINUE PRESENT REGIMEN;   ADVERSE REACTIONS: NONE;

## 2004-05-30 NOTE — Interdisciplinary (Signed)
PERFORMED ON - 05/30/2004 20:34:29;   DONE BY - ELGIN, HEDY R;  PROCEDURE - AERO TRT-MDI/SPACER;   PROTOCOL DRIVEN: YES-MD INITIATED;   MDI-LEARNING NEED: CORRECT USE OF MDI/SPACER/PF;   MEDICATION #1: COMBIVENT;   MED #1 AMOUNT: 4.0;   MED #1 UNIT: PUFFS;   REASON NOT STARTED: PT. NOT REQUIRING TREATMENT;   ADVERSE REACTIONS: NONE;

## 2004-05-30 NOTE — Interdisciplinary (Signed)
PERFORMED ON - 05/30/2004 13:05:52;   DONE BY - TACTAY, IRMA Q;  PROCEDURE - AERO TRT-MDI/SPACER;   PROTOCOL DRIVEN: YES-MD INITIATED;   MDI-LEARNING NEED: CORRECT USE OF MDI/SPACER/PF;   MEDICATION #1: COMBIVENT;   MED #1 AMOUNT: 4.0;   MED #1 UNIT: PUFFS;   REASON NOT STARTED: PT. NOT REQUIRING TREATMENT;   ADVERSE REACTIONS: NONE;

## 2004-05-31 LAB — URINALYSIS
Bilirubin: NEGATIVE
Blood: NEGATIVE
Glucose: NEGATIVE
Ketones: NEGATIVE
Leuk Esterase: NEGATIVE
Nitrite: NEGATIVE
Protein: NEGATIVE
Specific Gravity: 1.013 (ref 1.002–1.030)
Urobilinogen: 0.2 (ref 0.2–?)
pH: 5 (ref 5.0–8.0)

## 2004-05-31 LAB — MDIFF
Bands: 4 % (ref 0–15)
Diff Total: 100 %
Lymphocytes: 19 % — ABNORMAL LOW (ref 20–40)
Monocytes: 14 % — ABNORMAL HIGH (ref 1–10)
Plt Est: LOW
Segs: 63 % (ref 45–70)

## 2004-05-31 LAB — STOOL OCCULT BLOOD, SINGLE SAMPLE: Occult Blood, Guaiac: POSITIVE — ABNORMAL HIGH

## 2004-05-31 LAB — CBC WITH DIFF, BLOOD
Hct: 30.8 % — ABNORMAL LOW (ref 36.0–46.0)
Hgb: 9.9 g/dL — ABNORMAL LOW (ref 12.0–16.0)
MCH: 37 pg — ABNORMAL HIGH (ref 27–31)
MCHC: 32.1 % (ref 32–37)
MCV: 115 um3 — ABNORMAL HIGH (ref 82.0–98.0)
MPV: 11.2 fL — ABNORMAL HIGH (ref 7.4–10.4)
Plt Count: 37 10*3/uL — ABNORMAL LOW (ref 130–400)
RBC: 2.67 10*6/uL — ABNORMAL LOW (ref 4.00–5.00)
RDW: 14.9 % (ref 10–15)
WBC: 3.1 10*3/uL — ABNORMAL LOW (ref 4.0–11.0)

## 2004-05-31 LAB — BASIC METABOLIC PANEL, BLOOD
BUN: 6 mg/dL — ABNORMAL LOW (ref 8–18)
Bicarbonate: 28 meq/L (ref 24–31)
Calcium: 8.2 mg/dL — ABNORMAL LOW (ref 8.8–10.3)
Chloride: 103 meq/L (ref 97–107)
Creatinine: 0.6 mg/dL (ref 0.5–1.5)
Glucose: 45 mg/dL — CL (ref 65–110)
Potassium: 3.4 meq/L — ABNORMAL LOW (ref 3.5–5.0)
Sodium: 139 meq/L (ref 135–145)

## 2004-05-31 LAB — PHOSPHORUS, BLOOD: Phosphorous: 2.4 mg/dL — ABNORMAL LOW (ref 2.5–4.5)

## 2004-05-31 LAB — STOOL CULTURE

## 2004-05-31 LAB — MAGNESIUM, BLOOD: Magnesium: 1.8 mg/dL (ref 1.8–2.5)

## 2004-05-31 LAB — GRAM STAIN

## 2004-05-31 NOTE — Interdisciplinary (Signed)
PERFORMED ON - 05/31/2004 08:45:00;   DONE BY - RHODES, SANDY;  PROCEDURE - AERO TRT-MDI/SPACER;   PROTOCOL DRIVEN: YES-MD INITIATED;   MDI-LEARNING NEED: CORRECT USE OF MDI/SPACER/PF;   MEDICATION #1: COMBIVENT;   MED #1 AMOUNT: 4.0;   MED #1 UNIT: PUFFS;   REASON NOT STARTED: PT. NOT REQUIRING TREATMENT;   ADVERSE REACTIONS: NONE;

## 2004-05-31 NOTE — Interdisciplinary (Signed)
PERFORMED ON - 05/31/2004 14:40:00;   DONE BY - RHODES, SANDY;  PROCEDURE - PDP RE-EVALUATION;   RESP DIAGNOSIS: RESP DISTRESS/SOB;   ADMITTING DIAGNOSIS: (R) PLEURAL EFFUSION;   NEURO STATUS: ALERT AND ORIENTED;   CODE STATUS: FULL CODE/FULL CARE;   HEARTRATE: 89;   RESP RATE: 19;   HEMOGLOBIN: 9.9;   CLUBBING PRESENT: NO;   TRACHEOSTOMY PRESENT: NO;   SOB: NO;   BREATH SOUNDS: DIM BILAT;   CHEST EXCURSION: SYMMETRICAL;   COUGH EFFORT: NONE;   SPUTUM PRODUCTION: NONE;   PRE-TITR FIO2 OR L/M: 2L;   CHEST X-RAY: 10/21 (R) PLEURAL EFFUSION;   MD REQUEST #1: MDI PROTOCOL;   RCP RECOMMENDATION: #1: MDI PROTOCOL;   INDICATION: #1: B:TO INCREASED AERATION;   EVALUATION OUTCOME: #1: AS RCP RECOMMENDED;   MD REQUEST #2: O2 PROTOCOL;   RCP RECOMMENDATION: #2: O2 PROTOCOL;   INDICATION: #2: B: FOR SOB;   EVALUATION OUTCOME: #2: AS RCP RECOMMENDED;   ADVERSE REACTIONS: NONE;

## 2004-05-31 NOTE — Interdisciplinary (Signed)
PERFORMED ON - 05/31/2004 18:38:35;   DONE BY - HARTMAN, THOMAS J;  PROCEDURE - OXYGEN-LOW FLOW;   PROTOCOL DRIVEN: YES-MD INITIATED;   O2 DEVICE: CANNULA-NASAL;   LITERS/MIN: 2;   O2 LOW-ACTIV OUTCME: CONTINUE PRESENT REGIMEN;   ADVERSE REACTIONS: NONE;

## 2004-05-31 NOTE — Interdisciplinary (Signed)
PERFORMED ON - 05/31/2004 08:43:00;   DONE BY - RHODES, SANDY;  PROCEDURE - OXYGEN-LOW FLOW;   PROTOCOL DRIVEN: YES-MD INITIATED;   NEURO STATUS: ALERT AND ORIENTED;   O2 DEVICE: CANNULA-NASAL;   LITERS/MIN: 2;   SKIN COLOR: DUSKY;   SATURATION: 96;   O2 LOW-ACTIV OUTCME: CONTINUE PRESENT REGIMEN;   ADVERSE REACTIONS: NONE;

## 2004-05-31 NOTE — Interdisciplinary (Signed)
PERFORMED ON - 05/31/2004 02:48:56;   DONE BY - FURPHY, NICKOLETTE A;  PROCEDURE - AERO TRT-MDI/SPACER;   PROTOCOL DRIVEN: YES-MD INITIATED;   MDI-LEARNING NEED: CORRECT USE OF MDI/SPACER/PF;   MEDICATION #1: COMBIVENT;   MED #1 AMOUNT: 4.0;   MED #1 UNIT: PUFFS;   REASON NOT STARTED: PT. NOT REQUIRING TREATMENT;   ADVERSE REACTIONS: NONE;

## 2004-05-31 NOTE — Interdisciplinary (Signed)
PERFORMED ON - 05/31/2004 02:49:19;   DONE BY - FURPHY, NICKOLETTE A;  PROCEDURE - OXYGEN-LOW FLOW;   PROTOCOL DRIVEN: YES-MD INITIATED;   NEURO STATUS: SLEEPING;   O2 DEVICE: CANNULA-NASAL;   LITERS/MIN: 2;   SATURATION: 96;   O2 LOW-ACTIV OUTCME: CONTINUE PRESENT REGIMEN;   ADVERSE REACTIONS: NONE;

## 2004-06-01 ENCOUNTER — Other Ambulatory Visit (INDEPENDENT_AMBULATORY_CARE_PROVIDER_SITE_OTHER): Payer: Self-pay | Admitting: Internal Medicine

## 2004-06-01 ENCOUNTER — Other Ambulatory Visit (INDEPENDENT_AMBULATORY_CARE_PROVIDER_SITE_OTHER): Payer: Self-pay | Admitting: Specialist

## 2004-06-01 ENCOUNTER — Other Ambulatory Visit (INDEPENDENT_AMBULATORY_CARE_PROVIDER_SITE_OTHER): Payer: Self-pay | Admitting: Family Medicine

## 2004-06-01 LAB — HEMOGRAM, BLOOD
Hct: 24 % — ABNORMAL LOW (ref 36.0–46.0)
Hgb: 8 g/dL — ABNORMAL LOW (ref 12.0–16.0)
MCH: 35.7 pg — ABNORMAL HIGH (ref 27–31)
MCHC: 33.3 % (ref 32–37)
MCV: 107 um3 — ABNORMAL HIGH (ref 82.0–98.0)
Plt Count: 24 10*3/uL — ABNORMAL LOW (ref 130–400)
RBC: 2.24 10*6/uL — ABNORMAL LOW (ref 4.00–5.00)
RDW: 17 % — ABNORMAL HIGH (ref 10–15)
WBC: 1 10*3/uL — ABNORMAL LOW (ref 4.0–11.0)

## 2004-06-01 LAB — BASIC METABOLIC PANEL, BLOOD
BUN: 5 mg/dL — ABNORMAL LOW (ref 8–18)
Bicarbonate: 25 meq/L (ref 24–31)
Calcium: 8.6 mg/dL — ABNORMAL LOW (ref 8.8–10.3)
Chloride: 98 mEq/L (ref 97–107)
Creatinine: 0.6 mg/dL (ref 0.5–1.5)
Glucose: 73 mg/dL (ref 65–110)
Potassium: 3.7 mEq/L (ref 3.5–5.0)
Sodium: 134 meq/L — ABNORMAL LOW (ref 135–145)

## 2004-06-01 LAB — PHOSPHORUS, BLOOD: Phosphorous: 2 mg/dL — ABNORMAL LOW (ref 2.5–4.5)

## 2004-06-01 LAB — MAGNESIUM, BLOOD: Magnesium: 1.8 mg/dL (ref 1.8–2.5)

## 2004-06-01 NOTE — Interdisciplinary (Signed)
PERFORMED ON - 06/01/2004 11:47:33;   DONE BY - Granville Lewis, LEAH J;  PROCEDURE - PDP RE-EVALUATION;   RESP DIAGNOSIS: PLEURAL EFFUSION;   ADMITTING DIAGNOSIS: RT PLEURAL EFFUSION;   NEURO STATUS: ALERT AND ORIENTED;   CODE STATUS: FULL CODE/FULL CARE;   HEARTRATE: 79;   RESP RATE: 18;   HEMOGLOBIN: 9.9;   CLUBBING PRESENT: NO;   TRACHEOSTOMY PRESENT: NO;   SOB: NO;   BREATH SOUNDS: CL T/O;   CHEST EXCURSION: SYMMETRICAL;   COUGH EFFORT: FAIR;   SPUTUM PRODUCTION: SWALLOWED SECRETION;   PRE-TITR FIO2 OR L/M: 2;   PRE-TITR SAO2: 92;   CHEST X-RAY: DEC LUNG VOL, NO SIGNIFICANT CHANGE;   MD REQUEST #1: MDI PROTOCOL;   RCP RECOMMENDATION: #1: MDI PRN;   INDICATION: #1: B: FOR SOB;   EVALUATION OUTCOME: #1: AS RCP RECOMMENDED;   MD REQUEST #2: O2 PROTOCOL;   RCP RECOMMENDATION: #2: O2 PROTOCOL;   INDICATION: #2: O: SOB/LOW HB ADEQUATE SA02;   EVALUATION OUTCOME: #2: AS RCP RECOMMENDED;   ADVERSE REACTIONS: NONE;

## 2004-06-01 NOTE — Interdisciplinary (Signed)
PERFORMED ON - 06/01/2004 20:08:00;   DONE BY - FLANAGAN, KANDACE;  PROCEDURE - OXYGEN-LOW FLOW;   PROTOCOL DRIVEN: YES-MD INITIATED;   NEURO STATUS: ALERT AND ORIENTED;   O2 DEVICE: CANNULA-NASAL;   LITERS/MIN: 2;   SKIN COLOR: NORMAL;   ROOM AIR SAT: 94;   O2 LOW-ACTIV OUTCME: CHANGE TO O2 PRN;   ADVERSE REACTIONS: NONE;

## 2004-06-01 NOTE — Interdisciplinary (Signed)
PERFORMED ON - 06/01/2004 11:40:28;   DONE BY - DELANEY, LEAH J;  PROCEDURE - AERO TRT-MDI/SPACER;   PROTOCOL DRIVEN: YES-MD INITIATED;   EQUIP LABEL PROPER: YES;   MDI-LEARNING NEED: CORRECT USE OF MDI/SPACER/PF;   NEURO STATUS: ALERT AND ORIENTED;   MEDICATION #1: COMBIVENT;   MED #1 AMOUNT: 4.0;   MED #1 UNIT: PUFFS;   REASON NOT STARTED: PT. NOT REQUIRING TREATMENT;   ADVERSE REACTIONS: NONE;

## 2004-06-01 NOTE — Interdisciplinary (Signed)
PERFORMED ON - 06/01/2004 20:08:00;   DONE BY - FLANAGAN, KANDACE;  PROCEDURE - AERO TRT-MDI/SPACER;   PROTOCOL DRIVEN: YES-MD INITIATED;   MDI-LEARNING NEED: CORRECT USE OF MDI/SPACER/PF;   MEDICATION #1: COMBIVENT;   MED #1 AMOUNT: 4.0;   MED #1 UNIT: PUFFS;   REASON NOT STARTED: PT. NOT REQUIRING TREATMENT;   ADVERSE REACTIONS: NONE;

## 2004-06-01 NOTE — Interdisciplinary (Signed)
PERFORMED ON - 06/01/2004 11:45:13;   DONE BY - DELANEY, LEAH J;  PROCEDURE - OXYGEN-LOW FLOW;   PROTOCOL DRIVEN: YES-MD INITIATED;   NEURO STATUS: ALERT AND ORIENTED;   O2 DEVICE: CANNULA-NASAL;   LITERS/MIN: 2;   SKIN COLOR: NORMAL;   SATURATION: 92;   O2 LOW-ACTIV OUTCME: CONTINUE PRESENT REGIMEN;   ADVERSE REACTIONS: NONE;

## 2004-06-02 ENCOUNTER — Other Ambulatory Visit (INDEPENDENT_AMBULATORY_CARE_PROVIDER_SITE_OTHER): Payer: Self-pay | Admitting: Obstetrics & Gynecology

## 2004-06-02 LAB — LIVER PANEL, BLOOD
ALT (SGPT): 14 IU/L (ref 10–45)
AST (SGOT): 25 IU/L (ref 10–45)
Albumin: 4.8 gm/dL (ref 3.3–5.0)
Alkaline Phos: 47 IU/L (ref 30–130)
Bilirubin, Dir: 0.7 mg/dL — ABNORMAL HIGH (ref <0.2)
Bilirubin, Tot: 2.2 mg/dL — ABNORMAL HIGH (ref <1.2)
Total Protein: 6.5 gm/dL (ref 6.0–8.0)

## 2004-06-02 LAB — BASIC METABOLIC PANEL, BLOOD
BUN: 7 mg/dL — ABNORMAL LOW (ref 8–18)
Bicarbonate: 28 mEq/L (ref 24–31)
Calcium: 8.6 mg/dL — ABNORMAL LOW (ref 8.8–10.3)
Chloride: 98 mEq/L (ref 97–107)
Creatinine: 0.6 mg/dL (ref 0.5–1.5)
Glucose: 83 mg/dL (ref 65–110)
Potassium: 4 mEq/L (ref 3.5–5.0)
Sodium: 135 meq/L (ref 135–145)

## 2004-06-02 LAB — CBC WITH DIFF, BLOOD
Hct: 28.1 % — ABNORMAL LOW (ref 36.0–46.0)
Hgb: 9.4 gm/dL — ABNORMAL LOW (ref 12.0–16.0)
MCH: 35.6 pg — ABNORMAL HIGH (ref 27–31)
MCHC: 33.5 % (ref 32–37)
MCV: 106 um3 — ABNORMAL HIGH (ref 82.0–98.0)
Plt Count: 38 10*3/uL — ABNORMAL LOW (ref 130–400)
RBC: 2.65 10*6/uL — ABNORMAL LOW (ref 4.00–5.00)
RDW: 16.5 % — ABNORMAL HIGH (ref 10–15)
WBC: 2.2 10*3/uL — ABNORMAL LOW (ref 4.0–11.0)

## 2004-06-02 LAB — URINALYSIS
Bilirubin: NEGATIVE
Blood: NEGATIVE
Glucose: NEGATIVE
Ketones: NEGATIVE
Leuk Esterase: NEGATIVE
Nitrite: NEGATIVE
Protein: NEGATIVE
Specific Gravity: 1.01 (ref 1.002–1.030)
Urobilinogen: 0.2 (ref 0.2–?)
pH: 6.5 (ref 5.0–8.0)

## 2004-06-02 LAB — URINE CULTURE

## 2004-06-02 LAB — PROTHROMBIN TIME, BLOOD
INR: 2.3
INR: 2.2
Protime, Control: 9.7 s
Protime, Control: 9.7 s
Protime: 21.7 s — ABNORMAL HIGH (ref 9–12)
Protime: 21 s — ABNORMAL HIGH (ref 9–12)

## 2004-06-02 LAB — MDIFF
Bands: 12 % (ref 0–15)
Diff Total: 100 %
Eosinophils: 2 % (ref 1–3)
Lymphocytes: 16 % — ABNORMAL LOW (ref 20–40)
Monocytes: 23 % — ABNORMAL HIGH (ref 1–10)
Plt Est: LOW
Reactive Lymphs: 3 % — ABNORMAL HIGH (ref 0–0)
Segs: 44 % — ABNORMAL LOW (ref 45–70)

## 2004-06-02 LAB — APTT, BLOOD
PTT, Control: 26.7 s
PTT: 53.9 s — ABNORMAL HIGH (ref 25.0–33.0)

## 2004-06-02 LAB — HEMOGRAM, BLOOD
Hct: 27.9 % — ABNORMAL LOW (ref 36.0–46.0)
Hgb: 9.2 gm/dL — ABNORMAL LOW (ref 12.0–16.0)
MCH: 35 pg — ABNORMAL HIGH (ref 27–31)
MCHC: 33.1 % (ref 32–37)
MCV: 106 um3 — ABNORMAL HIGH (ref 82.0–98.0)
MPV: 13 fl — ABNORMAL HIGH (ref 7.4–10.4)
Plt Count: 34 10*3/uL — ABNORMAL LOW (ref 130–400)
RBC: 2.64 10*6/uL — ABNORMAL LOW (ref 4.00–5.00)
RDW: 15.9 % — ABNORMAL HIGH (ref 10–15)
WBC: 1.2 10*3/uL — ABNORMAL LOW (ref 4.0–11.0)

## 2004-06-02 LAB — MAGNESIUM, BLOOD: Magnesium: 1.8 mg/dL (ref 1.8–2.5)

## 2004-06-02 LAB — STOOL OCCULT BLOOD, SINGLE SAMPLE: Occult Blood, Guaiac: POSITIVE — ABNORMAL HIGH

## 2004-06-02 LAB — BLOOD CULTURE

## 2004-06-02 LAB — PHOSPHORUS, BLOOD: Phosphorous: 2 mg/dL — ABNORMAL LOW (ref 2.5–4.5)

## 2004-06-02 LAB — FIBRINOGEN, BLOOD: Fibrinogen: 110 mg/dL — ABNORMAL LOW (ref 200–400)

## 2004-06-02 NOTE — Interdisciplinary (Signed)
PERFORMED ON - 06/02/2004 14:30:53;   DONE BY - TACTAY, IRMA Q;  PROCEDURE - PDP RE-EVALUATION;   NEURO STATUS: ALERT AND ORIENTED;   HEARTRATE: 71;   BREATH SOUNDS: CLEAR;   CHEST EXCURSION: SYMMETRICAL;   COUGH EFFORT: NONE;   PRE-TITR FIO2 OR L/M: 2;   PRE-TITR SAO2: 98;   MD REQUEST #1: MDI PROTOCOL;   RCP RECOMMENDATION: #1: MDI PRN;   INDICATION: #1: B: PRN FOR WHEEZING;   EVALUATION OUTCOME: #1: AS RCP RECOMMENDED;   MD REQUEST #2: O2 PROTOCOL;   RCP RECOMMENDATION: #2: O2 PRN;   INDICATION: #2: O: TO KEEP SA02 >92;   EVALUATION OUTCOME: #2: AS RCP RECOMMENDED;   ADVERSE REACTIONS: NONE;

## 2004-06-02 NOTE — Interdisciplinary (Signed)
PERFORMED ON - 06/02/2004 14:35:29;   DONE BY - TACTAY, IRMA Q;  PROCEDURE - OXYGEN-LOW FLOW;   PROTOCOL DRIVEN: YES-MD INITIATED;   NEURO STATUS: ALERT AND ORIENTED;   O2 DEVICE: CANNULA-NASAL;   LITERS/MIN: 2;   SKIN COLOR: NORMAL;   SATURATION: 98;   O2 LOW-ACTIV OUTCME: CONTINUE PRESENT REGIMEN;   ADVERSE REACTIONS: NONE;

## 2004-06-03 ENCOUNTER — Other Ambulatory Visit (INDEPENDENT_AMBULATORY_CARE_PROVIDER_SITE_OTHER): Payer: Self-pay | Admitting: Internal Medicine

## 2004-06-03 LAB — HEMOGRAM, BLOOD
Hct: 27.3 % — ABNORMAL LOW (ref 36.0–46.0)
Hgb: 9.2 gm/dL — ABNORMAL LOW (ref 12.0–16.0)
MCH: 35.6 pg — ABNORMAL HIGH (ref 27–31)
MCHC: 33.7 % (ref 32–37)
MCV: 106 um3 — ABNORMAL HIGH (ref 82.0–98.0)
Plt Count: 23 10*3/uL — ABNORMAL LOW (ref 130–400)
RBC: 2.58 10*6/uL — ABNORMAL LOW (ref 4.00–5.00)
RDW: 15.6 % — ABNORMAL HIGH (ref 10–15)
WBC: 1.4 10*3/uL — ABNORMAL LOW (ref 4.0–11.0)

## 2004-06-03 LAB — CBC WITH DIFF, BLOOD
Hct: 32.5 % — ABNORMAL LOW (ref 36.0–46.0)
Hgb: 11 gm/dL — ABNORMAL LOW (ref 12.0–16.0)
MCH: 35.8 pg — ABNORMAL HIGH (ref 27–31)
MCHC: 33.9 % (ref 32–37)
MCV: 106 um3 — ABNORMAL HIGH (ref 82.0–98.0)
Plt Count: 37 10*3/uL — ABNORMAL LOW (ref 130–400)
RBC: 3.07 10*6/uL — ABNORMAL LOW (ref 4.00–5.00)
RDW: 15.7 % — ABNORMAL HIGH (ref 10–15)
WBC: 2.1 10*3/uL — ABNORMAL LOW (ref 4.0–11.0)

## 2004-06-03 LAB — MDIFF
Bands: 9 % (ref 0–15)
Basophils: 1 % (ref 0–2)
Diff Total: 100 %
Lymphocytes: 17 % — ABNORMAL LOW (ref 20–40)
Monocytes: 19 % — ABNORMAL HIGH (ref 1–10)
Plt Est: LOW
Segs: 54 % (ref 45–70)

## 2004-06-03 LAB — BASIC METABOLIC PANEL, BLOOD
BUN: 5 mg/dL — ABNORMAL LOW (ref 8–18)
Bicarbonate: 26 meq/L (ref 24–31)
Chloride: 97 mEq/L (ref 97–107)
Creatinine: 0.6 mg/dL (ref 0.5–1.5)
Glucose: 49 mg/dL — CL (ref 65–110)
Potassium: 3.4 mEq/L — ABNORMAL LOW (ref 3.5–5.0)
Sodium: 135 meq/L (ref 135–145)

## 2004-06-03 LAB — C.DIFFICILE PANEL-CDCLT/CDTOX

## 2004-06-03 LAB — PROTHROMBIN TIME, BLOOD
INR: 2.2
INR: 2.1
Protime, Control: 9.7 s
Protime, Control: 9.7 s
Protime: 21 s — ABNORMAL HIGH (ref 9–12)
Protime: 19.8 s — ABNORMAL HIGH (ref 9–12)

## 2004-06-03 LAB — MAGNESIUM, BLOOD: Magnesium: 1.9 mg/dL (ref 1.8–2.5)

## 2004-06-03 LAB — CALCIUM, BLOOD: Calcium: 8.9 mg/dL (ref 8.8–10.3)

## 2004-06-03 LAB — PHOSPHORUS, BLOOD: Phosphorous: 2.9 mg/dL (ref 2.5–4.5)

## 2004-06-03 NOTE — Consults (Signed)
Dictating Practitioner: Algernon Huxley, M.D.    Staff Physician: Zetta Bills. Gilford Raid, M.D.      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    REFERRING PHYSICIAN: Cloria Spring, M.D.    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 She presents to the Emergency Department for evaluation of  the same.    She does complain of some dry cough 2-3 days prior to her hospital arrival  and shortness of breath steadily worsening, onset about two days ago,  contributing to her substernal discomfort, which she describes as "not  really chest pain." No history of decreased urine output. No history of  upper GI or lower GI bleeding. No changes of mental status reported per  family. Some epigastric pain on and off, calls it "pancreas-related."  Stable, unchanged lower extremity edema. Stable 2-3 bowel movements per  day, aided by lactulose intake. Some new nausea with no complaint  vomiting. No weight changes. Some fevers. Some chronic chills at  baseline. She complains of a one-day history of diarrhea.    REVIEW OF SYSTEMS: Negative for anything other than in HPI.    ALLERGIES: NO KNOWN DRUG ALLERGIES.    PAST MEDICAL AND SURGERY HISTORY  1. HCV infection. Current viral load undetectable; on peginterferon and  ribavirin, diagnosed in 1999.    2. Spontaneous bacterial hydrothorax in November 2004.    3. Grade I esophageal varices; prior banding done. No prior history of GI  bleed and no hepatic encephalopathy recorded.    4. Status post cholecystectomy 30 years ago, requiring transfusion, most   likely HCV infection related to this transfusion.    5. C-section.    6. Vaginal sling.    7. Diabetes mellitus.    8. Pancreatic cystic lesion, suspicious for mucinous cystic neoplasm  pending endoscopic ultrasound by Dr. Richardean Canal; seen in clinic; please refer  to clinic note for further details.    9. Chronic right hepatic hydrothorax, status post thoracentesis earlier  with cytology negative and other fungal cultures and AP cultures negative.    MEDICATIONS AS AN OUTPATIENT: Levaquin 500 mg p.o. daily. Copegus 400 mg  p.o. b.i.d. Lasix 40 mg p.o. b.i.d. Aldactone 100 mg p.o. b.i.d.  Glyburide 5 mg p.o. b.i.d. Kay Ciel. Doxepin 10 mg p.o. daily. Protonix  40 mg p.o. daily. Propranolol 20 mg p.o. t.i.d. Peginterferon q. week.    PHYSICAL EXAMINATION: VITAL SIGNS: Temperature 98.3. Blood pressure  114/49. Pulse rate 76. Respiratory rate 16. Oxygen saturation 95%.  GENERAL APPEARANCE: Pleasant, in no apparent distress. HEAD AND NECK:  Mucous membranes are dry. No lymphadenopathy. No jugular venous  distention. LUNGS: Right hemithorax, breath sounds decreased up to the  mid-thoracic level. A few crackles in the upper border of the effusion.  Dullness to percussion. Decreased tactile and local fremitus.  CARDIOVASCULAR EXAM: Normal S1 and S2. No S3, S4. Systolic ejection  murmur, 2/6, at the base of the heart. ABDOMEN: Soft, nontender,  nondistended. Normoactive bowel sounds. No guarding or rebound. Minimal  fluid. CNS EXAM: Nonfocal. DTRs bilaterally equal. No asterixis.  EXTREMITIES: Warm extremities. No clubbing or cyanosis. Edema 1-plus,  bilaterally equal, only up to the ankle. GENITAL/RECTAL: Exams were not  performed.    LABORATORY/IMAGING STUDIES: Labs and imaging studies were reviewed. Most  importantly consistent with leukocytosis with white blood cell count at  9.5, much elevated from her baseline of 3. She has a hematocrit of 36.3,  which is stable. Her platelet count was 36,000. Her MCV was 107. Her   chemistry panel with a creatinine of 0.5. Electrolytes were relatively  normal. Her liver function panel demonstrates hypoalbuminemia with serum  albumin of 2.3. Her differential count demonstrated 59% bands. Analysis  of the fluid tap from her right hepatic hydrothorax consistent with  serum-ascitic-albumin gradient (SAAG) of greater than 1, and cell counts  not qualifying for a diagnosis of spontaneous bacterial empyema.    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 She is awaiting a transplant  of the liver.    1. Right pleural effusion, increased in size from prior. Concern mainly  was for infection of the hepatic hydrothorax in the setting of cirrhosis,  which is also known as spontaneous bacterial empyema. She was diagnosed  with this entity during her last hospitalization in November 2004. It was  felt the important thing to be done would be to tap this effusion once  again and perform a cell count, culture, and Gram stain to evaluate for  potential infection. At the time of the dictation, there is no growth in  her blood cultures. The Gram stain was negative. Routine culture was  negative for this fluid. Her cell count was not consistent with the  diagnosis of spontaneous bacterial empyema. The polymorphic nuclear cells  were less than 500 in the fluid. At the time of discharge, she was still  felt to be possibly having a partially treated SBE because she was already  on Levaquin. At this time, I would recommend that she be continued on and  complete a total course of 10 days of ciprofloxacin. During her hospital  stay, she did receive GNR coverage in the form of IV ceftriaxone. Her  ciprofloxacin should be given at a treatment dosage for a total of 10 days,   followed by 750 mg p.o. q. week and thereafter indefinitely given the fact  that her albumin contained in this fluid is less than one and she has had a  prior history of SBE.    Regarding the rest of the management of her hepatic hydrothorax, in the  long term, we recommend she follow a low sodium diet, no NSAIDS used,  diuretics being the mainstay of therapy, and no chest tube was recommended  at this time or even in the future. If this hydrothorax remains as a major  cause of her morbidity or mortality, we should consider giving her an  options of TIPS procedure in the future.    2. HCV and end-stage liver disease. In the setting of an ongoing  infection, we would obviously recommend to stop interferon and ribavirin at  this time. We will follow up her up in clinic and resume starting of her  interferon and ribavirin once she has cleared her ongoing infection.    3. Infectious diseases. We had given her empiric GNR  coverage while she  was in the hospital with 3rd generation Cephalosporin in the form of  ceftriaxone. At discharge, we are giving her fluoroquinolone at a  treatment dosage for a total of 10 days. We have pancultured her, and none  of the cultures are yielding a proper pathogenic organism. Her stool  studies are currently pending. She had come in with the complaint of  diarrhea. She had unexplained leukocytosis and bandemia, which was very  concerning for the diagnosis of C. diff colitis and, even at this time,  even if C. diff toxin or cultures are negative or pending, it is being  recommended that she should be started on empiric therapy for Clostridium  difficile colitis, nonetheless. She would be started on Flagyl therapy,  and I would recommend that the total duration of treatment with Flagyl  should go above and beyond the duration of treatment for spontaneous  bacterial empyema, which means at least a course of 21 days, with slow  tapering thereafter.     4. Hydration. She is very dehydrated clinically, and I am recommending 5%  albumin as a choice of resuscitation agent to be used at this time to  prevent anasarca if just crystalloids are used.    5. C. diff colitis. Please see the section as discussed above under  infectious diseases. We would recommend a total of 21 days at least of  p.o. Flagyl therapy, followed by maybe a slow taper thereafter.    Of note, the patient was on chronic Levaquin therapy, which could have  caused her to develop antibiotic-associated diarrhea.    The recommendations were conveyed to the primary medicine team, attending  Dr. Cloria Spring and Internal Medicine Service. These plans were also  discussed with Dr. Gilford Raid prior to conveying it to the team, who concurs  with the above plan.    We will followup with the patient in clinic once she is discharged from the  hospital. She will be seen in the Liver Transplant Clinic, and her  ribavirin and peginterferon there will be resumed once she has cleared  infection.    Thank you very much for this interesting consult.            Job Number [id number] [id number]          Reviewed & Electronically Signed  Algernon Huxley, M.D. 06/01/2004 10:06  Signature Derived From Controlled Access Password  [place]ennox. Gilford Raid, M.D. 06/03/2004 10:17    DD: 05/29/2004 DT: 05/31/2004 8:30 A DocNo.: [id number]  [id number]    Referring Physician:  EMERGENCY DEPARTMENT        Primary Care Physician:  [facility]        cc:

## 2004-06-04 LAB — MDIFF
Bands: 1 % (ref 0–15)
Diff Total: 100 %
Lymphocytes: 39 % (ref 20–40)
Monocytes: 10 % (ref 1–10)
Plt Est: LOW
Segs: 50 % (ref 45–70)

## 2004-06-04 LAB — BASIC METABOLIC PANEL, BLOOD
BUN: 7 mg/dL — ABNORMAL LOW (ref 8–18)
Bicarbonate: 26 meq/L (ref 24–31)
Calcium: 9.2 mg/dL (ref 8.8–10.3)
Chloride: 100 meq/L (ref 97–107)
Creatinine: 0.7 mg/dL (ref 0.5–1.5)
Glucose: 60 mg/dL — ABNORMAL LOW (ref 65–110)
Potassium: 4.4 meq/L (ref 3.5–5.0)
Sodium: 135 meq/L (ref 135–145)

## 2004-06-04 LAB — CBC WITH DIFF, BLOOD
Hct: 30.1 % — ABNORMAL LOW (ref 36.0–46.0)
Hgb: 9.7 g/dL — ABNORMAL LOW (ref 12.0–16.0)
MCH: 36.8 pg — ABNORMAL HIGH (ref 27–31)
MCHC: 32.4 % (ref 32–37)
MCV: 114 um3 — ABNORMAL HIGH (ref 82.0–98.0)
MPV: 12.6 fL — ABNORMAL HIGH (ref 7.4–10.4)
Plt Count: 25 10*3/uL — ABNORMAL LOW (ref 130–400)
RBC: 2.64 10*6/uL — ABNORMAL LOW (ref 4.00–5.00)
RDW: 14.6 % (ref 10–15)
WBC: 1.7 10*3/uL — ABNORMAL LOW (ref 4.0–11.0)

## 2004-06-04 LAB — MAGNESIUM, BLOOD: Magnesium: 2 mg/dL (ref 1.8–2.5)

## 2004-06-04 LAB — PHOSPHORUS, BLOOD: Phosphorous: 2 mg/dL — ABNORMAL LOW (ref 2.5–4.5)

## 2004-06-04 LAB — PROTHROMBIN TIME, BLOOD
INR: 1.9
Protime, Control: 9.7 s
Protime: 18 s — ABNORMAL HIGH (ref 9–12)

## 2004-06-04 LAB — SMEAR, WBC

## 2004-06-04 LAB — URINE CULTURE

## 2004-06-04 NOTE — Interdisciplinary (Signed)
PERFORMED ON - 06/04/2004 08:10:00;   DONE BY - MONEY, BRENDA G;  PROCEDURE - AERO TRT-MDI/SPACER;   PROTOCOL DRIVEN: YES-MD INITIATED;   MDI-LEARNING NEED: CORRECT USE OF MDI/SPACER/PF;   MEDICATION #1: COMBIVENT;   MED #1 AMOUNT: 4.0;   MED #1 UNIT: PUFFS;   REASON NOT STARTED: PT. NOT REQUIRING TREATMENT;   ADVERSE REACTIONS: NONE;

## 2004-06-04 NOTE — Interdisciplinary (Signed)
PERFORMED ON - 06/04/2004 08:12:00;   DONE BY - MONEY, BRENDA G;  PROCEDURE - OXYGEN-LOW FLOW;   PROTOCOL DRIVEN: YES-MD INITIATED;   O2 DEVICE: CANNULA-NASAL;   LITERS/MIN: 2;   ROOM AIR SAT: 95;   REASON NOT STARTED: PT. NOT REQUIRING TREATMENT;   ADVERSE REACTIONS: NONE;

## 2004-06-04 NOTE — Interdisciplinary (Signed)
PERFORMED ON - 06/04/2004 08:10:00;   DONE BY - MONEY, BRENDA G;  PROCEDURE - PDP RE-EVALUATION;   NEURO STATUS: ALERT AND ORIENTED;   CODE STATUS: FULL CODE/FULL CARE;   HEARTRATE: 73;   RESP RATE: 20;   CLUBBING PRESENT: NO;   TRACHEOSTOMY PRESENT: NO;   SOB: NO;   BREATH SOUNDS: RLL RALES, OTHERS CLEAR;   CHEST EXCURSION: SYMMETRICAL;   COUGH EFFORT: FAIR;   SPUTUM PRODUCTION: NONE;   PRE-TITR FIO2 OR L/M: 21;   PRE-TITR SAO2: 95;   MD REQUEST #1: MDI PROTOCOL;   RCP RECOMMENDATION: #1: MDI PRN;   INDICATION: #1: B: PRN FOR WHEEZING;   EVALUATION OUTCOME: #1: AS RCP RECOMMENDED;   MD REQUEST #2: O2 PROTOCOL;   RCP RECOMMENDATION: #2: O2 PRN;   INDICATION: #2: O: TO KEEP SA02 >92;   EVALUATION OUTCOME: #2: AS RCP RECOMMENDED;   ADVERSE REACTIONS: NONE;

## 2004-06-05 LAB — BLOOD CULTURE

## 2004-06-05 LAB — C.DIFFICILE PANEL-CDCLT/CDTOX

## 2004-06-05 LAB — ANAEROBIC CULTURE/GRAM STAIN

## 2004-06-07 LAB — BLOOD CULTURE

## 2004-06-07 LAB — C.DIFFICILE PANEL-CDCLT/CDTOX

## 2004-06-10 ENCOUNTER — Other Ambulatory Visit (INDEPENDENT_AMBULATORY_CARE_PROVIDER_SITE_OTHER): Payer: Self-pay | Admitting: Nurse Practitioner

## 2004-06-10 LAB — ADIF: Plt Est: LOW

## 2004-06-10 LAB — COMPREHENSIVE METABOLIC PANEL, BLOOD
ALT (SGPT): 15 [IU]/L (ref 10–45)
AST (SGOT): 33 [IU]/L (ref 10–45)
Albumin: 4.3 g/dL (ref 3.3–5.0)
Alkaline Phos: 63 [IU]/L (ref 30–130)
BUN: 7 mg/dL — ABNORMAL LOW (ref 8–18)
Bicarbonate: 27 meq/L (ref 24–31)
Bilirubin, Tot: 1.9 mg/dL — ABNORMAL HIGH (ref <1.2)
Calcium: 9 mg/dL (ref 8.8–10.3)
Chloride: 98 meq/L (ref 97–107)
Creatinine: 0.7 mg/dL (ref 0.5–1.5)
Glucose: 128 mg/dL — ABNORMAL HIGH (ref 65–110)
Potassium: 4.4 meq/L (ref 3.5–5.0)
Sodium: 135 meq/L (ref 135–145)
Total Protein: 8.4 g/dL — ABNORMAL HIGH (ref 6.0–8.0)

## 2004-06-10 LAB — CBC WITH DIFF, BLOOD
Basophils: 1 % (ref 0–2)
Eosinophils: 0 % — ABNORMAL LOW (ref 1–3)
Hct: 31.7 % — ABNORMAL LOW (ref 36.0–46.0)
Hgb: 10.3 g/dL — ABNORMAL LOW (ref 12.0–16.0)
Lymphocytes: 32 % (ref 20–40)
MCH: 36.4 pg — ABNORMAL HIGH (ref 27–31)
MCHC: 32.3 % (ref 32–37)
MCV: 113 um3 — ABNORMAL HIGH (ref 82.0–98.0)
MPV: 10.7 fL — ABNORMAL HIGH (ref 7.4–10.4)
Monocytes: 18 % — ABNORMAL HIGH (ref 1–10)
Plt Count: 49 10*3/uL — ABNORMAL LOW (ref 130–400)
RBC: 2.82 10*6/uL — ABNORMAL LOW (ref 4.00–5.00)
RDW: 14 % (ref 10–15)
Segs: 50 % (ref 45–70)
WBC: 1.6 10*3/uL — ABNORMAL LOW (ref 4.0–11.0)

## 2004-06-10 LAB — LIVER PANEL, BLOOD: Bilirubin, Dir: 0.5 mg/dL — ABNORMAL HIGH (ref <0.2)

## 2004-06-10 LAB — ALPHA FETOPROTEIN, SERUM: AFP: 3 ng/mL (ref <8)

## 2004-06-10 LAB — APTT, BLOOD
PTT, Control: 26.3 s
PTT: 36.7 s — ABNORMAL HIGH (ref 25.0–33.0)

## 2004-06-10 LAB — PROTHROMBIN TIME, BLOOD
INR: 1.6
Protime, Control: 9.7 s
Protime: 15.2 s — ABNORMAL HIGH (ref 9–12)

## 2004-06-11 ENCOUNTER — Ambulatory Visit (HOSPITAL_BASED_OUTPATIENT_CLINIC_OR_DEPARTMENT_OTHER): Admitting: Optometrist

## 2004-06-12 LAB — HEPATITIS C RNA, QUANT BLOOD: Hepatitis C RNA Quant: <10 [IU]/mL

## 2004-06-13 ENCOUNTER — Ambulatory Visit (HOSPITAL_BASED_OUTPATIENT_CLINIC_OR_DEPARTMENT_OTHER)

## 2004-06-13 ENCOUNTER — Inpatient Hospital Stay (INDEPENDENT_AMBULATORY_CARE_PROVIDER_SITE_OTHER): Payer: Self-pay | Admitting: Internal Medicine

## 2004-06-19 NOTE — Discharge Summary (Signed)
 Dictating Practitioner: Marshia Skene, M.D.    Attending Physician: Deloria Fetch, M.D.    Date of Admission: 05/28/2004  Date of Discharge: 06/04/2004          DISCHARGE DIAGNOSES: (1) End-stage liver disease secondary to hepatitis C.  (2) Right sided hydrothorax. (3) Type 2 diabetes.    CONSULTATIONS: Hepatology Service.    PRESENT ILLNESS: The patient is a 63 year old female with a history of  end-stage liver disease secondary to hepatitis C and type 2 diabetes with a  history of a known right hydrothorax who presented to the emergency  department with worsening shortness of breath and mild pleuritic chest pain  that had been going on for one day. She also had a dry cough times one day  with subjective fevers and chills. She denied any nausea, vomiting,  diarrhea, changes in bowel habits, melena, headache, or any other symptoms.      PAST MEDICAL HISTORY: (1) Hepatitis C for which she is currently  undergoing interferon treatment and is currently on the liver transplant  list with an undetectable viral load. (2) End-stage liver disease for  which the patient is currently on the transplant list. Her end-stage liver  disease is secondary to hepatitis C. (3) History of SBP. (4) History of  right sided hydrothorax in November 2004. (5) Type 2 diabetes. (6) Stable  cystic lesions noted in the pancreas, most recent CT scan in August 2005,  currently undergoing workup of these lesions. (7) Known grade I esophageal  varices in November 2004 with mild portal hypertensive gastropathy. (8)  Status post cholecystectomy.    ALLERGIES: NO KNOWN DRUG ALLERGIES.    ADMISSION MEDICATIONS: (1) Potassium chloride  60 mEq daily. (2) Glyburide   5 mg p.o. b.i.d. (3) Lasix  40 mg p.o. b.i.d. (4) Aldactone  100 mg p.o.  b.i.d. (5) Levaquin  500 mg p.o. daily. (6) Doxepin 10 mg p.o. daily.  Propranolol  20 mg p.o. t.i.d. (7) Protonix 40 mg p.o. daily. (8) She is  currently receiving therapy with interferon gamma for hepatitis  C.    SOCIAL HISTORY: She is married with eight children, lives with her  husband. She denied any alcohol or illicit drug use. She has about a 50  to 60-pack year history but quit ten years ago.    FAMILY HISTORY: Notable for a sister who died of pancreatic cancer.    ADMISSION PHYSICAL EXAMINATION: Afebrile, 98.3, pulse 76, respiratory rate  16, 95% on room air, blood pressure 114/49. General: The patient was in  no distress. HEENT: She was anicteric. Neck: Supple. There was no  lymphadenopathy. Chest: She had decreased breath sounds on the right side  half way up the lung field. The left side was clear with good air  movement. There was no obvious respiratory distress. Cardiac: Regular  rate and rhythm, normal S1, S2, no murmurs, rubs, or gallops. Abdomen:  Benign. Neurologic: Grossly intact.    ADMISSION LABS: CBC: White count 9.5 with 35% neutrophils and 59% bands,  hemoglobin 12.5, platelets 36. Chemistry was unremarkable. LFTs were at  her baseline. INR 1.5, BNP 170. One set of cardiac markers were negative.  A chest x-ray showed an enlarging right chronic pleural effusion with  possible right lower lobe consolidation.    HOSPITAL COURSE  1. Right sided hydrothorax. The patient underwent a thoracentesis after  her coagulopathy was corrected. The results of the thoracentesis were  consistent with a transudative process likely secondary to her known  hydrothorax. She did endorse subjective improvement  in her symptoms of  shortness of breath with the thoracentesis and removal of approximately 700  ml of clear yellow fluid. The patient did sustain a small pneumothorax  after this procedure which resolved on subsequent chest x-ray. The patient  was started empirically on antibiotics for a possible underlying pneumonia.      2. End-stage liver disease. The Hepatology Service followed while the  patient was in house. No significant changes were made to her diuretic  regimen. At the recommendation of the Hepatology  Service, the patient was  empirically treated for a full ten-day course for SBP with ciprofloxacin   and upon completion of this course will resume her SBP prophylaxis with  Cipro  750 mg weekly.    3. Chronic pancreatic cystic lesions. The patient was referred to Dr.  Orion Birks for further workup of these lesions as an outpatient and for  possible ERCP. He recommended endoscopic ultrasound to further  characterize these lesions. However, the patient missed her appointment  for this procedure secondary to being an inpatient. Thus, this will be  rescheduled upon discharge.    4. Diarrhea, possible C. difficile colitis. After admission and with  initiation of antibiotics, the patient began having episodes of multiple  loose bowel movements. C. difficile toxin and cultures were sent on  multiple occasions were negative; however, the Hepatology Team recommended  the patient be empirically treated with a tapering course of Flagyl  for  presumed C. difficile colitis.    CONDITION ON DISCHARGE: Improved.    DISPOSITION: The patient will be discharged to home.    DISCHARGE MEDICATIONS: She will continue receiving her interferon  treatment for her hepatitis C. (1) Copegus p.o. 700 mg two times a day.  (2) Lactulose  titrated to three bowel movements per day. (3) Potassium  chloride 16 mEq daily. (4) Aldactone  100 mg b.i.d. (5) Propranolol  20 mg  three times a day. (6) Doxepin 10 mg once a day. (7) Glyburide  5 mg twice  a day. (8) Ciprofloxacin  500 mg p.o. b.i.d. times ten days, then the  patient will resume 750 mg p.o. weekly for SBP prophylaxis. (9) Lasix  60  mg p.o. b.i.d. (10) Flagyl  on a tapering dose 500 mg p.o. q. 8 hours for  13 days, then 500 mg b.i.d. for three days, then 500 mg daily times two  days, then 500 mg every other day times three days. (11) Combivent MDI two  puffs four times a day.    DISCHARGE FOLLOWUP: The patient will follow up in Liver Clinic in one to  two weeks. The patient will also follow up [facility]  in approximately one to two weeks. She will have a chemistry checked in  one week prior to her appointment at [facility]. She will also  follow up with Dr. Orion Birks for poss ERCP/EUS as previously planned.                      Reviewed & Electronically Signed  Marshia Skene, M.D. 06/16/2004 13:08    Signature Derived From Controlled Access Password  Deloria Fetch, M.D. 06/19/2004 08:40    DD: 06/12/2004 DT: 06/13/2004 7:01 A DocNo.: [id number]  [id number].[facility]      Referring Physician:  EMERGENCY DEPARTMENT        Primary Care Physician:  [facility]        cc:

## 2004-06-25 ENCOUNTER — Encounter (HOSPITAL_BASED_OUTPATIENT_CLINIC_OR_DEPARTMENT_OTHER)

## 2004-07-12 ENCOUNTER — Ambulatory Visit (INDEPENDENT_AMBULATORY_CARE_PROVIDER_SITE_OTHER): Payer: Self-pay | Admitting: Gastroenterology

## 2004-07-12 NOTE — Procedures (Signed)
 Report Author: Joselyn Nicely. Orion Birks, M.D.    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 The patient was explained the risks,  benefits, and alternatives, and signed consent was  obtained.    INSTRUMENTS: [id number] # [id number], [id number] #  [id number]  MEDICATIONS: Demerol 50 mg IVP, Versed 2  mg IVP    The attending physician, Dr. Orion Birks, was present  for the entire examination.    PROCEDURE TECHNIQUE : A physical exam was  performed. Informed consent was obtained from the  patient after explaining all the risks (perforation,  bleeding, infection and adverse effects to the  medicine) , benefits and alternatives to the  procedure which the patient appeared to understand  and so stated. The patient was connected to the  monitoring devices and placed in the left lateral  position. Continuous oxygen was provided with a  nasal cannula and IV medicine administered  through a indwelling cannula. After adequate  conscious sedation was achieved, the patient was  intubated and the scope advanced under direct  visualization to the Second Part of Duodenum. The  scope was subsequently removed slowly while  carefully examining the color, texture, anatomy, and  integrity of the mucosa on the way out. The patient  was subsequently transferred to the recovery area  in satisfactory condition.    FINDINGS  Therapeutic duodenoscope scope revealed no  obvious esophageal varices. The stomach was  normal without gastropathy or gastric varices. The  duodenum was normal. Ampulla appeared normal.  Radial EUS revealed in the pancreatic neck there  was a 20 mm x 10 mm septated cyst. There was no  associated mass and no wall thickening. The  pancreatic duct upstream of this was 2 mm with  occasional side  branches. The pancreatic body was  atrophic. The common bile duct was a maximum of  7 mm. No gallbladder detected. The portal vein,  superior mesenteric vein, superior mesenteric  artery, splenic vein, splenic artery, and celiac  artery were normal. There were no peri-pancreatic  or celiac lymph nodes.    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  There was no associated mass to suggest  malignancy. The differential diagnosis of this cyst  is still quite broad, including mucinous  cystadenoma, IPMN, simple cyst, and pseudocyst.  No FNA was performed of the cyst because it would  be too risky in this patient given her low platelet  count, and because the information yielded likely  would not provide a definitive diagnosis.  2) No gallbladder detected.  3) Otherwise normal pancreatico-biliary EUS.    RECOMMENDATIONS  1) Repeat pancreatic protocol CT scan in 3-6  months to observe for any interval change.  2) Further recommendations as per Dr. Hassanein.          Signature Derived From Controlled Access Password  Joselyn Nicely. Orion Birks, M.D. 07/12/2004 21:17          DD: 07/12/2004 DT: 07/12/2004 8:00 A DocNo.: [id number]  TJS/bsm    Referring Physician:  Sarah Cumber N.P.  [address]  [place] [address]    Primary Care Physician:  DOVE JANET N.P.  [address]  [place], [place] [address]    cc:

## 2004-07-15 ENCOUNTER — Ambulatory Visit: Admitting: Gastroenterology

## 2004-07-26 ENCOUNTER — Other Ambulatory Visit (INDEPENDENT_AMBULATORY_CARE_PROVIDER_SITE_OTHER): Payer: Self-pay | Admitting: Hepatology

## 2004-07-26 LAB — ADIF: Plt Est: LOW

## 2004-07-26 LAB — PROTHROMBIN TIME, BLOOD
INR: 1.3
Protime, Control: 9.7 s
Protime: 13 s — ABNORMAL HIGH (ref 9–12)

## 2004-07-26 LAB — CBC WITH DIFF, BLOOD
Basophils: 2 % (ref 0–2)
Eosinophils: 3 % (ref 1–3)
Hct: 33.1 % — ABNORMAL LOW (ref 36.0–46.0)
Hgb: 11.4 gm/dL — ABNORMAL LOW (ref 12.0–16.0)
Lymphocytes: 39 % (ref 20–40)
MCH: 35.9 pg — ABNORMAL HIGH (ref 27–31)
MCHC: 34.3 % (ref 32–37)
MCV: 105 um3 — ABNORMAL HIGH (ref 82.0–98.0)
Monocytes: 9 % (ref 1–10)
Plt Count: 41 10*3/uL — ABNORMAL LOW (ref 130–400)
RBC: 3.17 10*6/uL — ABNORMAL LOW (ref 4.00–5.00)
RDW: 16.1 % — ABNORMAL HIGH (ref 10–15)
Segs: 47 % (ref 45–70)
WBC: 1.6 10*3/uL — ABNORMAL LOW (ref 4.0–11.0)

## 2004-07-26 LAB — COMPREHENSIVE METABOLIC PANEL, BLOOD
ALT (SGPT): 24 IU/L (ref 10–45)
AST (SGOT): 37 IU/L (ref 10–45)
Albumin: 2.7 gm/dL — ABNORMAL LOW (ref 3.3–5.0)
Alkaline Phos: 103 IU/L (ref 30–130)
BUN: 6 mg/dL — ABNORMAL LOW (ref 8–18)
Bicarbonate: 28 mEq/L (ref 24–31)
Bilirubin, Tot: 1.9 mg/dL — ABNORMAL HIGH (ref <1.2)
Calcium: 8.2 mg/dL — ABNORMAL LOW (ref 8.8–10.3)
Chloride: 98 mEq/L (ref 97–107)
Creatinine: 0.6 mg/dL (ref 0.5–1.5)
Glucose: 167 mg/dL — ABNORMAL HIGH (ref 65–110)
Potassium: 3.4 mEq/L — ABNORMAL LOW (ref 3.5–5.0)
Sodium: 135 mEq/L (ref 135–145)
Total Protein: 7 gm/dL (ref 6.0–8.0)

## 2004-07-26 LAB — LIVER PANEL, BLOOD: Bilirubin, Dir: 0.5 mg/dL — ABNORMAL HIGH (ref <0.2)

## 2004-07-26 LAB — APTT, BLOOD
PTT, Control: 25.4 s
PTT: 35.1 s — ABNORMAL HIGH (ref 25.0–33.0)

## 2004-07-26 LAB — ALPHA FETOPROTEIN, SERUM: AFP: 3 ng/mL (ref <8)

## 2004-07-29 LAB — GLYCOSYLATED HGB(A1C), BLOOD: Glyco Hgb (A1C): 6.4 % — ABNORMAL HIGH (ref 4.6–6.2)

## 2004-07-30 LAB — HEPATITIS C RNA, QUANT BLOOD: Hepatitis C RNA Quant: <10 IU/ml

## 2004-08-01 ENCOUNTER — Ambulatory Visit (HOSPITAL_BASED_OUTPATIENT_CLINIC_OR_DEPARTMENT_OTHER)

## 2004-09-25 ENCOUNTER — Other Ambulatory Visit (INDEPENDENT_AMBULATORY_CARE_PROVIDER_SITE_OTHER): Payer: Self-pay | Admitting: Nurse Practitioner

## 2004-09-26 ENCOUNTER — Encounter (HOSPITAL_BASED_OUTPATIENT_CLINIC_OR_DEPARTMENT_OTHER): Payer: Self-pay

## 2004-10-03 ENCOUNTER — Encounter (HOSPITAL_BASED_OUTPATIENT_CLINIC_OR_DEPARTMENT_OTHER)

## 2004-10-16 ENCOUNTER — Other Ambulatory Visit (INDEPENDENT_AMBULATORY_CARE_PROVIDER_SITE_OTHER): Payer: Self-pay | Admitting: Hepatology

## 2004-10-16 LAB — URINE COMPREHENSIVE DRUG PANEL
Amphetamines Screen: NEGATIVE
Barbiturates Screen: NEGATIVE
Benzodiazepine Screen: NEGATIVE
Cocaine Screen: NEGATIVE
Methadone Screen: NEGATIVE
Opiates Screen: NEGATIVE
Phencyclidine Screen: NEGATIVE
Propoxyphen: NEGATIVE
THC Screen: NEGATIVE

## 2004-10-16 LAB — ADIF: Plt Est: LOW

## 2004-10-16 LAB — COMPREHENSIVE METABOLIC PANEL, BLOOD
ALT (SGPT): 24 IU/L (ref 10–45)
AST (SGOT): 34 IU/L (ref 10–45)
Albumin: 2.8 gm/dL — ABNORMAL LOW (ref 3.3–5.0)
Alkaline Phos: 80 IU/L (ref 30–130)
BUN: 11 mg/dL (ref 8–18)
Bicarbonate: 26 mEq/L (ref 24–31)
Bilirubin, Tot: 1.4 mg/dL — ABNORMAL HIGH (ref <1.2)
Calcium: 8.3 mg/dL — ABNORMAL LOW (ref 8.8–10.3)
Chloride: 104 mEq/L (ref 97–107)
Creatinine: 0.6 mg/dL (ref 0.5–1.5)
Glucose: 297 mg/dL — ABNORMAL HIGH (ref 65–110)
Potassium: 4.2 mEq/L (ref 3.5–5.0)
Sodium: 137 mEq/L (ref 135–145)
Total Protein: 7.3 gm/dL (ref 6.0–8.0)

## 2004-10-16 LAB — CBC WITH DIFF, BLOOD
Basophils: 1 % (ref 0–2)
Eosinophils: 1 % (ref 1–3)
Hct: 33.5 % — ABNORMAL LOW (ref 36.0–46.0)
Hgb: 11.9 gm/dL — ABNORMAL LOW (ref 12.0–16.0)
Lymphocytes: 34 % (ref 20–40)
MCH: 37.9 pg — ABNORMAL HIGH (ref 27–31)
MCHC: 35.6 % (ref 32–37)
MCV: 106 um3 — ABNORMAL HIGH (ref 82.0–98.0)
MPV: 8.2 fl (ref 7.4–10.4)
Monocytes: 9 % (ref 1–10)
Plt Count: 46 10*3/uL — ABNORMAL LOW (ref 130–400)
RBC: 3.15 10*6/uL — ABNORMAL LOW (ref 4.00–5.00)
RDW: 13.4 % (ref 10–15)
Segs: 55 % (ref 45–70)
WBC: 2.4 10*3/uL — ABNORMAL LOW (ref 4.0–11.0)

## 2004-10-16 LAB — APTT, BLOOD
PTT, Control: 26.5 s
PTT: 34.3 s — ABNORMAL HIGH (ref 25.0–33.0)

## 2004-10-16 LAB — GAMMA GLUTAMYL TRANSFERASE, BLOOD: GGT: 12 IU/L (ref <30)

## 2004-10-16 LAB — IMMUNOGLOBULIN PANEL (IGA,IGG,IGM), BLOOD
IGA: 506 mg/dL — ABNORMAL HIGH (ref 66–436)
IGG: 3045 mg/dL — ABNORMAL HIGH (ref 791–1643)
IGM: 113 mg/dL (ref 43–279)

## 2004-10-16 LAB — TRANSFERRIN, BLOOD: Transferrin: 163 mg/dL — ABNORMAL LOW (ref 192–382)

## 2004-10-16 LAB — ALCOHOL, BLOOD

## 2004-10-16 LAB — FERRITIN, BLOOD: Ferritin: 71 ng/mL (ref ?–291)

## 2004-10-16 LAB — IRON, BLOOD: Iron: 122 ug/dL (ref 65–175)

## 2004-10-16 LAB — PROTHROMBIN TIME, BLOOD
INR: 1.4
Protime, Control: 9.7 s
Protime: 13.4 s — ABNORMAL HIGH (ref 9–12)

## 2004-10-16 LAB — IRON BINDING CAPACITY, BLOOD: IBC (TIBC): 206 ug/dL — ABNORMAL LOW (ref 250–450)

## 2004-10-16 LAB — LIVER PANEL, BLOOD: Bilirubin, Dir: 0.4 mg/dL — ABNORMAL HIGH (ref <0.2)

## 2004-10-16 LAB — THYROXINE(T4), BLOOD: Thyroxine (T4): 10.1 ug/dL (ref 4.5–10.9)

## 2004-10-16 LAB — ALPHA FETOPROTEIN, SERUM: AFP: 2 ng/mL (ref <8)

## 2004-10-16 LAB — TSH SENSITIVE, BLOOD: TSH Sensitive: 0.08 u[IU]/mL — ABNORMAL LOW (ref 0.35–5.50)

## 2004-10-17 LAB — HEPATITIS B CORE IGG/IGM PANEL, BLOOD
HBcAb IgG: NONREACTIVE
HBcAb IgM: NONREACTIVE

## 2004-10-17 LAB — CMV IGG/IGM AB PANEL, BLOOD
CMV IGG Ab: POSITIVE
CMV IGM Ab: NEGATIVE

## 2004-10-17 LAB — HEPATITIS B SURFACE AG, BLOOD: HBsAg: NONREACTIVE

## 2004-10-17 LAB — HEPATITIS A IGM & TOTAL AB  PANEL, BLOOD
Hepatitis A Antibody IGM: NONREACTIVE
Hepatitis A Antibody: REACTIVE — ABNORMAL HIGH

## 2004-10-17 LAB — HEPATITIS B SURFACE AB, QUANT, BLOOD: HBsAb,Qt: <1 m[IU]/mL

## 2004-10-18 LAB — ANTI-SMOOTH MUSCLE AB, BLOOD: Anti-Smooth Muscle Ab: POSITIVE — ABNORMAL HIGH

## 2004-10-18 LAB — HTLV 1/2, BLOOD: HTLV 1/2 Ab (Elisa): NEGATIVE

## 2004-10-18 LAB — CERULOPLASMIN, BLOOD: Ceruloplasmin: 23 mg/dL (ref 17–54)

## 2004-10-18 LAB — ANTI-SMOOTH MUSC AB-TITER, BLOOD: Anti-Smooth Muscle Ab Titer: >1:640 {titer} — ABNORMAL HIGH

## 2004-10-18 LAB — E.BARR VIRUS IGG/IGM PANEL, BLOOD
Epstein Barr IGG Ab: POSITIVE
Epstein Barr IGM Ab: NEGATIVE

## 2004-10-18 LAB — ANA (ANTI-NUCLEAR AB), BLOOD: ANA (Anti-Nuclear Ab): NEGATIVE

## 2004-10-18 LAB — AMA (ANTI-MITOCHONDRIAL AB), BLOOD: AMA (Anti-Mitochondria Ab): NEGATIVE

## 2004-10-19 LAB — HEPATITIS C RNA, QUANT BLOOD: Hepatitis C RNA Quant: <10 IU/ml

## 2004-10-21 LAB — HEPATITIS B(E) ANTIGEN, BLOOD: Hepatitis B(E) Ag: NEGATIVE

## 2004-10-23 LAB — HERPES SIMPLEX VIRUS (HSV) 1/2 IGG
Herpes Simplex 1 IGG Ab: POSITIVE — ABNORMAL HIGH
Herpes Simplex 2 IGG Ab: POSITIVE — ABNORMAL HIGH

## 2004-10-24 LAB — EPG, SERUM
A/G Ratio, EPG: 0.64
Albumin, EPG: 2.85 gm/dL — ABNORMAL LOW (ref 3.20–5.00)
Alpha 1, EPG: 0.19 gm/dL (ref 0.10–0.40)
Alpha 2, EPG: 0.65 gm/dL (ref 0.60–1.10)
Beta, EPG: 0.57 gm/dL — ABNORMAL LOW (ref 0.60–1.30)
Gamma, EPG: 3.04 gm/dL — ABNORMAL HIGH (ref 0.70–1.50)
Total Protein, EPG: 7.3 gm/dL (ref 6.00–8.00)

## 2004-10-31 ENCOUNTER — Ambulatory Visit (HOSPITAL_BASED_OUTPATIENT_CLINIC_OR_DEPARTMENT_OTHER)

## 2004-11-15 ENCOUNTER — Other Ambulatory Visit (INDEPENDENT_AMBULATORY_CARE_PROVIDER_SITE_OTHER): Payer: Self-pay | Admitting: Hepatology

## 2004-11-15 LAB — COMPREHENSIVE METABOLIC PANEL, BLOOD
ALT (SGPT): 22 IU/L (ref 10–45)
AST (SGOT): 33 IU/L (ref 10–45)
Albumin: 2.8 gm/dL — ABNORMAL LOW (ref 3.3–5.0)
Alkaline Phos: 77 IU/L (ref 30–130)
BUN: 9 mg/dL (ref 8–18)
Bicarbonate: 26 mEq/L (ref 24–31)
Bilirubin, Tot: 2.1 mg/dL — ABNORMAL HIGH (ref <1.2)
Calcium: 8.7 mg/dL — ABNORMAL LOW (ref 8.8–10.3)
Chloride: 105 mEq/L (ref 97–107)
Creatinine: 0.6 mg/dL (ref 0.5–1.5)
Glucose: 140 mg/dL — ABNORMAL HIGH (ref 65–110)
Potassium: 5 mEq/L (ref 3.5–5.0)
Sodium: 140 mEq/L (ref 135–145)
Total Protein: 6.9 gm/dL (ref 6.0–8.0)

## 2004-11-15 LAB — ADIF: Plt Est: LOW

## 2004-11-15 LAB — PROTHROMBIN TIME, BLOOD
INR: 1.4
Protime, Control: 9.7 s
Protime: 13.6 s — ABNORMAL HIGH (ref 9–12)

## 2004-11-15 LAB — CBC WITH DIFF, BLOOD
Basophils: 1 % (ref 0–2)
Eosinophils: 2 % (ref 1–3)
Hct: 37.8 % (ref 36.0–46.0)
Hgb: 13.2 gm/dL (ref 12.0–16.0)
Lymphocytes: 35 % (ref 20–40)
MCH: 35.7 pg — ABNORMAL HIGH (ref 27–31)
MCHC: 34.9 % (ref 32–37)
MCV: 102 um3 — ABNORMAL HIGH (ref 82.0–98.0)
MPV: 8.6 fl (ref 7.4–10.4)
Monocytes: 10 % (ref 1–10)
Plt Count: 43 10*3/uL — ABNORMAL LOW (ref 130–400)
RBC: 3.7 10*6/uL — ABNORMAL LOW (ref 4.00–5.00)
RDW: 13.4 % (ref 10–15)
Segs: 52 % (ref 45–70)
WBC: 2.8 10*3/uL — ABNORMAL LOW (ref 4.0–11.0)

## 2004-11-15 LAB — APTT, BLOOD
PTT, Control: 26.1 s
PTT: 33.8 s — ABNORMAL HIGH (ref 25.0–33.0)

## 2004-11-15 LAB — LIVER PANEL, BLOOD: Bilirubin, Dir: 0.4 mg/dL — ABNORMAL HIGH (ref <0.2)

## 2004-12-13 ENCOUNTER — Other Ambulatory Visit (INDEPENDENT_AMBULATORY_CARE_PROVIDER_SITE_OTHER): Payer: Self-pay | Admitting: Nurse Practitioner

## 2004-12-19 ENCOUNTER — Ambulatory Visit (HOSPITAL_BASED_OUTPATIENT_CLINIC_OR_DEPARTMENT_OTHER)

## 2004-12-19 ENCOUNTER — Encounter (HOSPITAL_BASED_OUTPATIENT_CLINIC_OR_DEPARTMENT_OTHER): Payer: Self-pay

## 2005-03-07 ENCOUNTER — Other Ambulatory Visit (INDEPENDENT_AMBULATORY_CARE_PROVIDER_SITE_OTHER): Payer: Self-pay | Admitting: Hepatology

## 2005-03-07 LAB — CBC WITH DIFF, BLOOD
Basophils: 1 % (ref 0–2)
Eosinophils: 1 % (ref 1–3)
Hct: 39.2 % (ref 36.0–46.0)
Hgb: 13.7 gm/dL (ref 12.0–16.0)
Lymphocytes: 34 % (ref 20–40)
MCH: 34.2 pg — ABNORMAL HIGH (ref 27–31)
MCHC: 34.9 % (ref 32–37)
MCV: 97.9 um3 (ref 82.0–98.0)
MPV: 7.6 fl (ref 7.4–10.4)
Monocytes: 10 % (ref 1–10)
Plt Count: 45 10*3/uL — ABNORMAL LOW (ref 130–400)
RBC: 4.01 10*6/uL (ref 4.00–5.00)
RDW: 15.6 % — ABNORMAL HIGH (ref 10–15)
Segs: 54 % (ref 45–70)
WBC: 2.8 10*3/uL — ABNORMAL LOW (ref 4.0–11.0)

## 2005-03-07 LAB — COMPREHENSIVE METABOLIC PANEL, BLOOD
ALT (SGPT): 26 IU/L (ref 10–45)
AST (SGOT): 33 IU/L (ref 10–45)
Albumin: 3.2 gm/dL — ABNORMAL LOW (ref 3.3–5.0)
Alkaline Phos: 98 IU/L (ref 30–130)
BUN: 12 mg/dL (ref 8–18)
Bicarbonate: 32 mEq/L — ABNORMAL HIGH (ref 24–31)
Bilirubin, Tot: 1.9 mg/dL — ABNORMAL HIGH (ref <1.2)
Calcium: 8.6 mg/dL — ABNORMAL LOW (ref 8.8–10.3)
Chloride: 102 mEq/L (ref 97–107)
Creatinine: 0.6 mg/dL (ref 0.5–1.5)
Glucose: 180 mg/dL — ABNORMAL HIGH (ref 65–110)
Potassium: 4.2 mEq/L (ref 3.5–5.0)
Sodium: 137 mEq/L (ref 135–145)
Total Protein: 7.5 gm/dL (ref 6.0–8.0)

## 2005-03-07 LAB — LIVER PANEL, BLOOD: Bilirubin, Dir: 0.4 mg/dL — ABNORMAL HIGH (ref <0.2)

## 2005-03-07 LAB — URINE COMPREHENSIVE DRUG PANEL
Amphetamines Screen: NEGATIVE
Barbiturates Screen: NEGATIVE
Benzodiazepine Screen: NEGATIVE
Cocaine Screen: NEGATIVE
Methadone Screen: NEGATIVE
Opiates Screen: NEGATIVE
Phencyclidine Screen: NEGATIVE
Propoxyphen: NEGATIVE
THC Screen: NEGATIVE

## 2005-03-07 LAB — ADIF: Plt Est: LOW

## 2005-03-07 LAB — ALCOHOL, BLOOD

## 2005-03-07 LAB — PROTHROMBIN TIME, BLOOD
INR: 1.3
Protime, Control: 9.7 s
Protime: 12.7 s — ABNORMAL HIGH (ref 9–12)

## 2005-03-07 LAB — APTT, BLOOD
PTT, Control: 26.7 s
PTT: 33.5 s — ABNORMAL HIGH (ref 25.0–33.0)

## 2005-03-14 ENCOUNTER — Other Ambulatory Visit: Payer: Self-pay

## 2005-03-14 ENCOUNTER — Other Ambulatory Visit (INDEPENDENT_AMBULATORY_CARE_PROVIDER_SITE_OTHER): Payer: Self-pay

## 2005-03-20 ENCOUNTER — Encounter (HOSPITAL_BASED_OUTPATIENT_CLINIC_OR_DEPARTMENT_OTHER): Payer: Self-pay

## 2005-03-20 ENCOUNTER — Ambulatory Visit (HOSPITAL_BASED_OUTPATIENT_CLINIC_OR_DEPARTMENT_OTHER)

## 2005-04-03 ENCOUNTER — Encounter (HOSPITAL_BASED_OUTPATIENT_CLINIC_OR_DEPARTMENT_OTHER): Payer: Self-pay

## 2005-06-09 ENCOUNTER — Other Ambulatory Visit (INDEPENDENT_AMBULATORY_CARE_PROVIDER_SITE_OTHER): Payer: Self-pay | Admitting: Hepatology

## 2005-06-09 LAB — COMPREHENSIVE METABOLIC PANEL, BLOOD
ALT (SGPT): 29 IU/L (ref 10–45)
AST (SGOT): 33 IU/L (ref 10–45)
Albumin: 2.9 gm/dL — ABNORMAL LOW (ref 3.3–5.0)
Alkaline Phos: 86 IU/L (ref 30–130)
BUN: 11 mg/dL (ref 8–18)
Bicarbonate: 28 mEq/L (ref 24–31)
Bilirubin, Tot: 2.1 mg/dL — ABNORMAL HIGH (ref <1.2)
Calcium: 8.6 mg/dL — ABNORMAL LOW (ref 8.8–10.3)
Chloride: 102 mEq/L (ref 97–107)
Creatinine: 0.6 mg/dL (ref 0.5–1.5)
Glucose: 117 mg/dL — ABNORMAL HIGH (ref 65–110)
Potassium: 4.4 mEq/L (ref 3.5–5.0)
Sodium: 142 mEq/L (ref 135–145)
Total Protein: 6.7 gm/dL (ref 6.0–8.0)

## 2005-06-09 LAB — ADIF: Plt Est: LOW

## 2005-06-09 LAB — URINE COMPREHENSIVE DRUG PANEL
Amphetamines Screen: NEGATIVE
Barbiturates Screen: NEGATIVE
Benzodiazepine Screen: NEGATIVE
Cocaine Screen: NEGATIVE
Methadone Screen: NEGATIVE
Opiates Screen: NEGATIVE
Phencyclidine Screen: NEGATIVE
Propoxyphen: NEGATIVE
THC Screen: NEGATIVE

## 2005-06-09 LAB — CBC WITH DIFF, BLOOD
Basophils: 1 % (ref 0–2)
Eosinophils: 1 % (ref 1–3)
Hct: 36.7 % (ref 36.0–46.0)
Hgb: 12.8 gm/dL (ref 12.0–16.0)
Lymphocytes: 27 % (ref 20–40)
MCH: 34 pg — ABNORMAL HIGH (ref 27–31)
MCHC: 34.8 % (ref 32–37)
MCV: 97.6 um3 (ref 82.0–98.0)
MPV: 8.4 fl (ref 7.4–10.4)
Monocytes: 13 % — ABNORMAL HIGH (ref 1–10)
Plt Count: 46 10*3/uL — ABNORMAL LOW (ref 130–400)
RBC: 3.76 10*6/uL — ABNORMAL LOW (ref 4.00–5.00)
RDW: 14.4 % (ref 10–15)
Segs: 58 % (ref 45–70)
WBC: 2.7 10*3/uL — ABNORMAL LOW (ref 4.0–11.0)

## 2005-06-09 LAB — LIVER PANEL, BLOOD: Bilirubin, Dir: 0.4 mg/dL — ABNORMAL HIGH (ref <0.2)

## 2005-06-09 LAB — APTT, BLOOD
PTT, Control: 27.1 s
PTT: 34 s — ABNORMAL HIGH (ref 25.0–33.0)

## 2005-06-09 LAB — PROTHROMBIN TIME, BLOOD
INR: 1.3
Protime, Control: 9.7 s
Protime: 12.6 s — ABNORMAL HIGH (ref 9–12)

## 2005-06-09 LAB — ALCOHOL, BLOOD

## 2005-07-17 ENCOUNTER — Ambulatory Visit (HOSPITAL_BASED_OUTPATIENT_CLINIC_OR_DEPARTMENT_OTHER)

## 2005-08-21 ENCOUNTER — Ambulatory Visit (HOSPITAL_BASED_OUTPATIENT_CLINIC_OR_DEPARTMENT_OTHER)

## 2005-08-27 ENCOUNTER — Other Ambulatory Visit (HOSPITAL_BASED_OUTPATIENT_CLINIC_OR_DEPARTMENT_OTHER): Payer: Self-pay | Admitting: Internal Medicine

## 2005-08-27 ENCOUNTER — Other Ambulatory Visit: Payer: Self-pay

## 2005-09-08 ENCOUNTER — Other Ambulatory Visit (HOSPITAL_BASED_OUTPATIENT_CLINIC_OR_DEPARTMENT_OTHER): Payer: Self-pay | Admitting: Internal Medicine

## 2005-09-08 LAB — URINALYSIS
Glucose: NEGATIVE
Ketones: NEGATIVE
Nitrite: NEGATIVE
Specific Gravity: 1.017 (ref 1.002–1.030)
Urobilinogen: 0.2 (ref 0.2–?)
pH: 7 (ref 5.0–8.0)

## 2005-09-24 ENCOUNTER — Other Ambulatory Visit (HOSPITAL_BASED_OUTPATIENT_CLINIC_OR_DEPARTMENT_OTHER): Payer: Self-pay | Admitting: Internal Medicine

## 2005-10-16 ENCOUNTER — Ambulatory Visit (HOSPITAL_BASED_OUTPATIENT_CLINIC_OR_DEPARTMENT_OTHER)

## 2005-10-16 ENCOUNTER — Other Ambulatory Visit: Payer: Self-pay | Admitting: Internal Medicine

## 2005-11-28 ENCOUNTER — Other Ambulatory Visit (HOSPITAL_BASED_OUTPATIENT_CLINIC_OR_DEPARTMENT_OTHER): Payer: Self-pay | Admitting: Hepatology

## 2005-11-28 LAB — CBC WITH DIFF, BLOOD
Hct: 38.3 % (ref 36.0–46.0)
Hgb: 13.1 gm/dL (ref 12.0–16.0)
MCH: 33.5 pg — ABNORMAL HIGH (ref 27–31)
MCHC: 34.1 % (ref 32–37)
MCV: 98.1 um3 — ABNORMAL HIGH (ref 82.0–98.0)
MPV: 9.2 fl (ref 7.4–10.4)
Plt Count: 51 10*3/uL — ABNORMAL LOW (ref 130–400)
RBC: 3.91 10*6/uL — ABNORMAL LOW (ref 4.00–5.00)
RDW: 12.8 % (ref 10–15)
WBC: 3 10*3/uL — ABNORMAL LOW (ref 4.0–11.0)

## 2005-11-28 LAB — MDIFF
Bands: 1 % (ref 0–15)
Diff Total: 100 %
Eosinophils: 3 % (ref 1–3)
Lymphocytes: 29 % (ref 20–40)
Monocytes: 6 % (ref 1–10)
Plt Est: LOW
Segs: 61 % (ref 45–70)

## 2005-11-28 LAB — COMPREHENSIVE METABOLIC PANEL, BLOOD
ALT (SGPT): 22 IU/L (ref 10–45)
AST (SGOT): 28 IU/L (ref 10–45)
Albumin: 3 gm/dL — ABNORMAL LOW (ref 3.3–5.0)
Alkaline Phos: 108 IU/L (ref 30–130)
BUN: 11 mg/dL (ref 8–18)
Bicarbonate: 25 mEq/L (ref 24–31)
Bilirubin, Tot: 1.3 mg/dL — ABNORMAL HIGH (ref <1.2)
Calcium: 8.4 mg/dL — ABNORMAL LOW (ref 8.8–10.3)
Chloride: 105 mEq/L (ref 97–107)
Creatinine: 0.6 mg/dL (ref 0.5–1.5)
GFR (African Amer.): >60 mL/min
GFR: >60 mL/min
Glucose: 136 mg/dL — ABNORMAL HIGH (ref 65–110)
Potassium: 4.6 mEq/L (ref 3.5–5.0)
Sodium: 141 mEq/L (ref 135–145)
Total Protein: 6.7 gm/dL (ref 6.0–8.0)

## 2005-11-28 LAB — URINE COMPREHENSIVE DRUG PANEL
Amphetamines Screen: NEGATIVE
Barbiturates Screen: NEGATIVE
Benzodiazepine Screen: NEGATIVE
Cocaine Screen: NEGATIVE
Methadone Screen: NEGATIVE
Opiates Screen: NEGATIVE
Phencyclidine Screen: NEGATIVE
Propoxyphen: NEGATIVE
THC Screen: NEGATIVE

## 2005-11-28 LAB — PROTHROMBIN TIME, BLOOD
INR: 1.4
PT,Patient: 12.4 s — ABNORMAL HIGH (ref 7.0–10.0)

## 2005-11-28 LAB — APTT, BLOOD: PTT: 36.6 s — ABNORMAL HIGH (ref 25.0–34.0)

## 2005-11-28 LAB — ALCOHOL, BLOOD

## 2005-11-28 LAB — LIVER PANEL, BLOOD: Bilirubin, Dir: 0.3 mg/dL — ABNORMAL HIGH (ref <0.2)

## 2005-12-03 ENCOUNTER — Ambulatory Visit

## 2005-12-05 NOTE — Op Note (Signed)
Dictating Practitioner: Selinda Orion, M.D.     Staff Physician: Selinda Orion, M.D.    Date of Operation: 12/03/2005    PREOPERATIVE DIAGNOSIS: Patient with end-stage liver disease secondary to  hepatitis C, on the transplant list, being followed up for portal  hypertension, esophageal varices.  POSTOPERATIVE DIAGNOSIS: Esophageal varices, mild portal hypertensive  gastropathy, erosive antritis, normal duodenum.  OPERATION: Upper endoscopy.  SURGEON/STAFF: T. Jeniyah Menor    SEDATION: Versed 3 mg intravenous.    DESCRIPTION OF PROCEDURE: After the patient consented to the procedure,  knowing alternatives and risks of infection, perforation and bleeding, the  patient was sedated and laid down in the left lateral decubitus position.  An Olympus videoendoscope was introduced under direct vision through the  mouth, down the esophagus, into the stomach and into the duodenum. The  esophagus showed trace esophageal varies, with no signs of recent or  pending bleeding. The mucosa of the stomach was slightly edematous  consistent with mild portal hypertensive gastropathy. The antrum showed  multiple erosions. The pylorus was patent. The first, and second portion of  the duodenum was normal. The endoscope was withdrawn. Retroflexion showed  abscess of gastric varices. The endoscope was withdrawn. The patient  tolerated the procedure well.    IMPRESSION: Esophageal varices, small hiatal hernia, mild portal  hypertensive gastropathy, antral erosions, and no gastric varices.            Signature Derived From Controlled Access Password  Selinda Orion, M.D. 12/05/2005 08:41    DD: 12/03/2005 DT: 12/03/2005 3:47 P DocNo.: [id number]  TIH/DLD       Primary Care Physician:   Rene Paci M.D.   [address] [place], [place] [address]    cc:

## 2005-12-16 ENCOUNTER — Other Ambulatory Visit: Payer: Self-pay | Admitting: Internal Medicine

## 2005-12-18 ENCOUNTER — Other Ambulatory Visit (HOSPITAL_BASED_OUTPATIENT_CLINIC_OR_DEPARTMENT_OTHER): Payer: Self-pay | Admitting: Adult Health

## 2005-12-18 ENCOUNTER — Other Ambulatory Visit (HOSPITAL_BASED_OUTPATIENT_CLINIC_OR_DEPARTMENT_OTHER): Payer: Self-pay

## 2005-12-18 ENCOUNTER — Ambulatory Visit (HOSPITAL_BASED_OUTPATIENT_CLINIC_OR_DEPARTMENT_OTHER)

## 2005-12-22 NOTE — Interdisciplinary (Signed)
Dictating Practitioner: Monico Hoar, MSW    OUTPATIENT LIVER TRANSPLANT CLINIC FOLLOW UP:    I met with pt. in clinc on 12/18/05 to obtain an update. Her daughter was  with her and both stated that pt appears to be doing fairly well recently.  She denied any conccerns that would warrant social work intervention. She  did inform me that she is trying to get her citizenship now and wanted some  paper work filled out. I referred her back to her PMD and explained that  our medical staff will may not fill out the form which excuses her from  taking the classes due to the mental status changes she has due to the  liver disease. I took a copy of the form and she or her daughter will  contact me if the PMD does not fill it out; at that time I will present it  to the medical team.    Follow up as needed for the form but no other concern was identified. She  continues to have good support at home and care she needs.      Monico Hoar, MSW  pager [phone]            Signature Derived From Controlled Access Password  Monico Hoar, MSW 12/22/2005 09:53

## 2006-02-12 ENCOUNTER — Ambulatory Visit (HOSPITAL_BASED_OUTPATIENT_CLINIC_OR_DEPARTMENT_OTHER)

## 2006-02-24 ENCOUNTER — Other Ambulatory Visit (HOSPITAL_BASED_OUTPATIENT_CLINIC_OR_DEPARTMENT_OTHER): Payer: Self-pay

## 2006-04-09 ENCOUNTER — Other Ambulatory Visit (HOSPITAL_BASED_OUTPATIENT_CLINIC_OR_DEPARTMENT_OTHER): Payer: Self-pay

## 2006-04-16 ENCOUNTER — Ambulatory Visit (HOSPITAL_BASED_OUTPATIENT_CLINIC_OR_DEPARTMENT_OTHER)

## 2006-05-28 ENCOUNTER — Other Ambulatory Visit (HOSPITAL_BASED_OUTPATIENT_CLINIC_OR_DEPARTMENT_OTHER): Payer: Self-pay | Admitting: Hepatology

## 2006-05-28 LAB — ALCOHOL, BLOOD

## 2006-05-28 LAB — CBC WITH DIFF, BLOOD
Basophils: 1 % (ref 0–2)
Eosinophils: 1 % (ref 1–3)
Hct: 37.8 % (ref 36.0–46.0)
Hgb: 13.3 gm/dL (ref 12.0–16.0)
Lymphocytes: 24 % (ref 20–40)
MCH: 34.9 pg — ABNORMAL HIGH (ref 27–31)
MCHC: 35.2 % (ref 32–37)
MCV: 99.3 um3 — ABNORMAL HIGH (ref 82.0–98.0)
MPV: 10.3 fl (ref 7.4–10.4)
Monocytes: 10 % (ref 1–10)
Plt Count: 45 10*3/uL — ABNORMAL LOW (ref 130–400)
RBC: 3.81 10*6/uL — ABNORMAL LOW (ref 4.00–5.00)
RDW: 15.8 % — ABNORMAL HIGH (ref 10–15)
Segs: 64 % (ref 45–70)
WBC: 2.8 10*3/uL — ABNORMAL LOW (ref 4.0–11.0)

## 2006-05-28 LAB — UR DRUGS OF ABUSE SCREEN
Amphetamines Screen: NEGATIVE
Barbiturates Screen: NEGATIVE
Benzodiazepine Screen: NEGATIVE
Cocaine Screen: NEGATIVE
Methadone Screen: NEGATIVE
Opiates Screen: NEGATIVE
Phencyclidine Screen: NEGATIVE
Propoxyphen: NEGATIVE
THC Screen: NEGATIVE

## 2006-05-28 LAB — PROTHROMBIN TIME, BLOOD
INR: 1.3
PT,Patient: 11.6 s — ABNORMAL HIGH (ref 7.0–10.0)

## 2006-05-28 LAB — COMPREHENSIVE METABOLIC PANEL, BLOOD
ALT (SGPT): 28 IU/L (ref 10–45)
AST (SGOT): 30 IU/L (ref 10–45)
Albumin: 3.1 gm/dL — ABNORMAL LOW (ref 3.3–5.0)
Alkaline Phos: 85 IU/L (ref 30–130)
BUN: 9 mg/dL (ref 8–18)
Bicarbonate: 28 mEq/L (ref 24–31)
Bilirubin, Tot: 1.8 mg/dL — ABNORMAL HIGH (ref <1.2)
Calcium: 8.7 mg/dL — ABNORMAL LOW (ref 8.8–10.3)
Chloride: 103 mEq/L (ref 97–107)
Creatinine: 0.7 mg/dL (ref 0.5–1.5)
GFR (African Amer.): >60 mL/min
GFR: >60 mL/min
Glucose: 170 mg/dL — ABNORMAL HIGH (ref 65–110)
Potassium: 4.4 mEq/L (ref 3.5–5.0)
Sodium: 141 mEq/L (ref 135–145)
Total Protein: 6.8 gm/dL (ref 6.0–8.0)

## 2006-05-28 LAB — APTT, BLOOD: PTT: 32.7 s (ref 25.0–34.0)

## 2006-05-28 LAB — ADIF: Plt Est: LOW

## 2006-05-28 LAB — LIVER PANEL, BLOOD: Bilirubin, Dir: 0.3 mg/dL — ABNORMAL HIGH (ref <0.2)

## 2006-09-30 ENCOUNTER — Other Ambulatory Visit (HOSPITAL_BASED_OUTPATIENT_CLINIC_OR_DEPARTMENT_OTHER): Payer: Self-pay | Admitting: Adult Health

## 2006-10-01 ENCOUNTER — Other Ambulatory Visit: Payer: Self-pay | Admitting: Internal Medicine

## 2006-10-08 ENCOUNTER — Ambulatory Visit (HOSPITAL_BASED_OUTPATIENT_CLINIC_OR_DEPARTMENT_OTHER)

## 2006-10-08 NOTE — Progress Notes (Signed)
CLINIC: Nutrition    REPORT TYPE: Consult    Dictating Practitioner: Sanjuana Mae, RD    DATE OF SERVICE: 10/08/2006    REASON FOR VISIT: Transplant Evaluation    Initial nutrition assessment completed and paper copy may be found in pt's  chart.                  Signature Derived From Controlled Access Password  Sanjuana Mae, [place] 10/08/2006 11:32 A        DD: 10/08/2006 DT: 10/08/2006 11:31 A DocNo.: [id number]  AEW/      Primary Care Physician:  Rene Paci M.D.  [address] [place], [place] [address]    cc:

## 2006-10-15 ENCOUNTER — Other Ambulatory Visit: Payer: Self-pay

## 2006-10-15 ENCOUNTER — Other Ambulatory Visit (HOSPITAL_BASED_OUTPATIENT_CLINIC_OR_DEPARTMENT_OTHER): Payer: Self-pay | Admitting: Gastroenterology

## 2006-10-28 ENCOUNTER — Ambulatory Visit: Admitting: Hepatology

## 2006-10-28 ENCOUNTER — Ambulatory Visit (HOSPITAL_BASED_OUTPATIENT_CLINIC_OR_DEPARTMENT_OTHER): Payer: Self-pay | Admitting: Hepatology

## 2006-11-04 ENCOUNTER — Other Ambulatory Visit (HOSPITAL_BASED_OUTPATIENT_CLINIC_OR_DEPARTMENT_OTHER): Payer: Self-pay | Admitting: Internal Medicine

## 2006-11-04 ENCOUNTER — Ambulatory Visit (HOSPITAL_COMMUNITY)

## 2006-11-12 ENCOUNTER — Ambulatory Visit (HOSPITAL_BASED_OUTPATIENT_CLINIC_OR_DEPARTMENT_OTHER)

## 2006-11-15 LAB — ECG, COMPLETE (HC/~~LOC~~/ENCINITAS)
ECG INTERPRETATION: NORMAL
PR INTERVAL: 146 ms
QRS INTERVAL/DURATION: 80 ms
QT: 474 ms
QTc (Bazett): 474 ms
R AXIS: 56 degrees
VENTRICULAR RATE: 60 {beats}/min

## 2007-01-05 ENCOUNTER — Other Ambulatory Visit (HOSPITAL_BASED_OUTPATIENT_CLINIC_OR_DEPARTMENT_OTHER): Payer: Self-pay | Admitting: Gastroenterology

## 2007-01-05 ENCOUNTER — Other Ambulatory Visit (HOSPITAL_BASED_OUTPATIENT_CLINIC_OR_DEPARTMENT_OTHER): Payer: Self-pay | Admitting: Hepatology

## 2007-01-05 LAB — CBC WITH DIFF, BLOOD
Basophils: 2 % (ref 0–2)
Eosinophils: 2 % (ref 1–3)
Hct: 39.1 % (ref 36.0–46.0)
Hgb: 14 gm/dL (ref 12.0–16.0)
Lymphocytes: 22 % (ref 20–40)
MCH: 35.2 pg — ABNORMAL HIGH (ref 27–31)
MCHC: 35.9 % (ref 32–37)
MCV: 98.2 um3 — ABNORMAL HIGH (ref 82.0–98.0)
MPV: 8.7 fl (ref 7.4–10.4)
Monocytes: 10 % (ref 1–10)
Plt Count: 49 10*3/uL — ABNORMAL LOW (ref 130–400)
RBC: 3.98 10*6/uL — ABNORMAL LOW (ref 4.00–5.00)
RDW: 13.5 % (ref 10–15)
Segs: 64 % (ref 45–70)
WBC: 3.5 10*3/uL — ABNORMAL LOW (ref 4.0–11.0)

## 2007-01-05 LAB — COMPREHENSIVE METABOLIC PANEL, BLOOD
ALT (SGPT): 26 IU/L (ref 10–45)
AST (SGOT): 29 IU/L (ref 10–45)
Albumin: 3.3 gm/dL (ref 3.3–5.0)
Alkaline Phos: 88 IU/L (ref 30–130)
BUN: 10 mg/dL (ref 8–18)
Bicarbonate: 30 mEq/L (ref 24–31)
Bilirubin, Tot: 1.8 mg/dL — ABNORMAL HIGH (ref <1.2)
Calcium: 8.8 mg/dL (ref 8.8–10.3)
Chloride: 103 mEq/L (ref 97–107)
Creatinine: 0.7 mg/dL (ref 0.5–1.5)
GFR (African Amer.): >60 mL/min
GFR: >60 mL/min
Glucose: 177 mg/dL — ABNORMAL HIGH (ref 65–110)
Potassium: 3.8 mEq/L (ref 3.5–5.0)
Sodium: 140 mEq/L (ref 135–145)
Total Protein: 7.3 gm/dL (ref 6.0–8.0)

## 2007-01-05 LAB — UR DRUGS OF ABUSE SCREEN
Amphetamines Screen: NEGATIVE
Barbiturates Screen: NEGATIVE
Benzodiazepine Screen: NEGATIVE
Cocaine Screen: NEGATIVE
Methadone Screen: NEGATIVE
Opiates Screen: NEGATIVE
Phencyclidine Screen: NEGATIVE
Propoxyphen: NEGATIVE
THC Screen: NEGATIVE

## 2007-01-05 LAB — ALCOHOL, BLOOD

## 2007-01-05 LAB — PROTHROMBIN TIME, BLOOD
INR: 1.4
PT,Patient: 12.2 s — ABNORMAL HIGH (ref 7.0–10.0)

## 2007-01-05 LAB — ADIF: Plt Est: LOW

## 2007-01-05 LAB — APTT, BLOOD: PTT: 32.5 s (ref 25.0–34.0)

## 2007-01-05 LAB — LIVER PANEL, BLOOD: Bilirubin, Dir: 0.3 mg/dL — ABNORMAL HIGH (ref <0.2)

## 2007-01-07 ENCOUNTER — Other Ambulatory Visit (HOSPITAL_BASED_OUTPATIENT_CLINIC_OR_DEPARTMENT_OTHER): Payer: Self-pay | Admitting: Nurse Practitioner

## 2007-01-07 LAB — 24 HOUR CREATININE CLEARANCE
Creatinine Clear Total 24hr: 98 mL/min (ref ?–125)
Creatinine Total 24hr: 0.9 gm/TV (ref ?–1.8)
Creatinine, Urine: 42 mg/dL
Duration: 24 HOURS
Volume: 2025 mL

## 2007-01-07 LAB — 24 HOUR URINE TOTAL PROTEIN
Protein Total 24hr: INVALID mg/TV (ref ?–100)
Total Protein, Urine: <6 mg/dL

## 2007-01-07 LAB — HEPATITIS C RNA, QUANT BLOOD: Hepatitis C RNA Quant: <28 IU/ml

## 2007-01-14 ENCOUNTER — Encounter (HOSPITAL_BASED_OUTPATIENT_CLINIC_OR_DEPARTMENT_OTHER): Payer: Self-pay | Admitting: Gastroenterology

## 2007-01-14 ENCOUNTER — Encounter (HOSPITAL_BASED_OUTPATIENT_CLINIC_OR_DEPARTMENT_OTHER): Payer: Self-pay

## 2007-01-21 ENCOUNTER — Ambulatory Visit (HOSPITAL_BASED_OUTPATIENT_CLINIC_OR_DEPARTMENT_OTHER): Admitting: Gastroenterology

## 2007-02-01 NOTE — Progress Notes (Signed)
CLINIC: [facility] - HEPATOLOGY    REPORT TYPE: LETTER    Dictating Practitioner: Leone Payor, M.D.    DATE OF SERVICE: 01/21/2007    REASON FOR VISIT: Paula Manning      January 21, 2007        Sherrie Mustache, M.D.  [place]    Re: Paula Manning    Dear Dr. Megan Salon:    I had the pleasure of seeing our mutual patient in followup at the [facility] today.    PROBLEM LIST:  1. Hepatitis C related cirrhosis.  2. Active on the liver transplant waiting list with a blood type of O  positive.  3. Hepatitis C with a sustained virologic response.  4. Diabetes mellitus, poorly-controlled with oral hypoglycemics.  5. Emphysema.  6. Osteopenia.  7. 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  8. Glaucoma.    INTERVAL HISTORY: The patient was last seen in this clinic on October 08, 2006. In the interim she has done well without the need for  hospitalization or emergency care. She completed a few studies needed for  her transplant workup including an adenosine MIBI stress test on November 19, 2006, which showed no evidence of ischemia or infarct. The patient also  had an abdominal CT scan on November 17, 2006, which showed no suspicious  lesions for [facility], as well as non-occlusive thrombus in the main portal vein.  The patient had pulmonary function tests on November 04, 2006, which were  consistent with a mild obstructive picture. She had an upper endoscopy on  October 28, 2006, which showed non-bleeding distal grade 1 esophageal  varices. The patient remains hepatitis C RNA PCR negative as of Jan 05, 2007. Overall she is feeling well and denies any complaints of GI  bleeding, hepatic encephalopathy, or fluid retention.    REVIEW OF SYSTEMS: As described above. All other systems are negative.    CURRENT MEDICATIONS: Glyburide 5 mg p.o. b.i.d.; propranolol 20 mg p.o.  t.i.d.; Protonix 40 mg p.o. daily; Lasix 40 mg p.o. daily; spironolactone  100 mg p.o. daily; calcium 500 mg p.o. b.i.d.; Fosamax 35 mg p.o.  weekly.    PHYSICAL EXAMINATION: VITAL SIGNS: Temperature 97.0, pulse 68, blood  pressure 120/60, weight 151 pounds, BMI 26.0 GENERAL APPEARANCE: The  patient is a well-appearing in no apparent discomfort. She is accompanied  today by her son who is acting as a Research officer, trade union. She is awake and  alert, conversant, pleasant, and appropriate. HEENT: There is no jaundice  or scleral icterus. ABDOMEN: Obese, but nontender and soft. EXTREMITIES:  There is no lower extremity edema. NEUROLOGIC: There is no asterixis.    LABORATORY STUDIES: Labs from Jan 05, 2007, white blood cell count 3.5,  hematocrit 39.1, platelets 49, INR 1.4. Sodium 140, potassium 3.8, BUN 10,  creatinine 0.7, glucose 177. AST 29, ALT 26, total bilirubin 1.8, direct  bilirubin 0.3, alkaline phosphatase 88, albumin 3.3. Hepatitis C RNA PCR  less than 28 international units per mL.    IMPRESSIONS: This is a 66 year old woman with hepatitis C related  cirrhosis complicated by mild fluid retention well-controlled with  diuretics as well as small esophageal varices. Overall she is doing well.  She is active on the liver transplant list with a low MELD score.    1. Esophageal variceal surveillance. We will need a repeat upper  endoscopy in March of 2009.    2. Hepatocellular carcinoma screening. We will need to repeat  tri-phasic  abdominal CT scan in October of 2008.    3. Hepatitis C infection. The patient had a sustained virologic response  and we will need of follow PCRs yearly.    4. Diabetes mellitus. The patient's primary care provider is going to  start insulin, which I fully support.    The patient should return to this clinic in 5 months following completion  of her abdominal CT scan.            Job Number [id number]            Signature Derived From Controlled Access Password  Leone Payor, M.D. 02/01/2007 05:09 P        DD: 01/21/2007 DT: 01/21/2007 05:21 P DocNo.: [id number]  AK/r12 [id number]      Primary Care Physician:  Rene Paci  M.D.  [address] [place], [place] [address]    cc: Sherrie Mustache, M.D.   Fax Recipient   [address]   [place] [address]

## 2007-04-13 ENCOUNTER — Other Ambulatory Visit (HOSPITAL_BASED_OUTPATIENT_CLINIC_OR_DEPARTMENT_OTHER): Payer: Self-pay | Admitting: Hepatology

## 2007-04-13 LAB — COMPREHENSIVE METABOLIC PANEL, BLOOD
ALT (SGPT): 29 IU/L (ref 14–54)
AST (SGOT): 34 IU/L (ref 15–41)
Albumin: 3.3 gm/dL (ref 3.3–5.0)
Alkaline Phos: 81 IU/L (ref 38–126)
BUN: 9 mg/dL (ref 8–20)
Bicarbonate: 30 mmol/L (ref 22–32)
Bilirubin, Tot: 2.2 mg/dL — ABNORMAL HIGH (ref <1.2)
Calcium: 8.5 mg/dL — ABNORMAL LOW (ref 8.9–10.3)
Chloride: 105 mmol/L (ref 101–111)
Creatinine: 0.7 mg/dL (ref 0.6–1.1)
GFR (African Amer.): >60 mL/min
GFR: >60 mL/min
Glucose: 145 mg/dL — ABNORMAL HIGH (ref 79–115)
Potassium: 3.8 mmol/L (ref 3.6–5.1)
Sodium: 140 mmol/L (ref 136–144)
Total Protein: 7.3 gm/dL (ref 6.1–7.9)

## 2007-04-13 LAB — UR DRUGS OF ABUSE SCREEN
Amphetamines Screen: NEGATIVE
Barbiturates Screen: NEGATIVE
Benzodiazepine Screen: NEGATIVE
Cocaine Screen: NEGATIVE
Methadone Screen: NEGATIVE
Opiates Screen: NEGATIVE
Phencyclidine Screen: NEGATIVE
Propoxyphen: NEGATIVE
THC Screen: NEGATIVE

## 2007-04-13 LAB — CBC WITH DIFF, BLOOD
Basophils: 1 % (ref 0–2)
Eosinophils: 1 % (ref 1–3)
Hct: 37.6 % (ref 36.0–46.0)
Hgb: 13.8 gm/dL (ref 12.0–16.0)
Lymphocytes: 24 % (ref 20–40)
MCH: 35.5 pg — ABNORMAL HIGH (ref 27–31)
MCHC: 36.8 % (ref 32–37)
MCV: 96.5 um3 (ref 82.0–98.0)
MPV: 8.3 fl (ref 7.4–10.4)
Monocytes: 12 % — ABNORMAL HIGH (ref 1–10)
Plt Count: 40 10*3/uL — ABNORMAL LOW (ref 130–400)
RBC: 3.89 10*6/uL — ABNORMAL LOW (ref 4.00–5.00)
RDW: 13 % (ref 10–15)
Segs: 61 % (ref 45–70)
WBC: 3.8 10*3/uL — ABNORMAL LOW (ref 4.0–11.0)

## 2007-04-13 LAB — ADIF: Plt Est: LOW

## 2007-04-13 LAB — PROTHROMBIN TIME, BLOOD
INR: 1.4
PT,Patient: 16.2 s — ABNORMAL HIGH (ref 9.7–12.5)

## 2007-04-13 LAB — ALCOHOL, BLOOD

## 2007-04-13 LAB — APTT, BLOOD: PTT: 31.8 s (ref 25.0–34.0)

## 2007-04-13 LAB — LIVER PANEL, BLOOD: Bilirubin, Dir: 0.3 mg/dL (ref 0.1–0.5)

## 2007-05-10 ENCOUNTER — Other Ambulatory Visit (HOSPITAL_BASED_OUTPATIENT_CLINIC_OR_DEPARTMENT_OTHER): Payer: Self-pay | Admitting: Gastroenterology

## 2007-05-21 ENCOUNTER — Other Ambulatory Visit (HOSPITAL_BASED_OUTPATIENT_CLINIC_OR_DEPARTMENT_OTHER): Payer: Self-pay | Admitting: Gastroenterology

## 2007-05-21 LAB — CBC WITH DIFF, BLOOD
Basophils: 0 % (ref 0–2)
Eosinophils: 2 % (ref 1–3)
Hct: 35.8 % — ABNORMAL LOW (ref 36.0–46.0)
Hgb: 13.1 gm/dL (ref 12.0–16.0)
Lymphocytes: 28 % (ref 20–40)
MCH: 34.9 pg — ABNORMAL HIGH (ref 27–31)
MCHC: 36.5 % (ref 32–37)
MCV: 95.6 um3 (ref 82.0–98.0)
MPV: 8.3 fl (ref 7.4–10.4)
Monocytes: 9 % (ref 1–10)
Plt Count: 48 10*3/uL — ABNORMAL LOW (ref 130–400)
RBC: 3.75 10*6/uL — ABNORMAL LOW (ref 4.00–5.00)
RDW: 12.8 % (ref 10–15)
Segs: 61 % (ref 45–70)
WBC: 3 10*3/uL — ABNORMAL LOW (ref 4.0–11.0)

## 2007-05-21 LAB — COMPREHENSIVE METABOLIC PANEL, BLOOD
ALT (SGPT): 28 IU/L (ref 14–54)
AST (SGOT): 28 IU/L (ref 15–41)
Albumin: 3.2 gm/dL — ABNORMAL LOW (ref 3.3–5.0)
Alkaline Phos: 72 IU/L (ref 38–126)
BUN: 9 mg/dL (ref 8–20)
Bicarbonate: 28 mmol/L (ref 22–32)
Bilirubin, Tot: 1.5 mg/dL — ABNORMAL HIGH (ref <1.2)
Calcium: 8.6 mg/dL — ABNORMAL LOW (ref 8.9–10.3)
Chloride: 105 mmol/L (ref 101–111)
Creatinine: 0.7 mg/dL (ref 0.6–1.1)
GFR (African Amer.): >60 mL/min
GFR: >60 mL/min
Glucose: 210 mg/dL — ABNORMAL HIGH (ref 79–115)
Potassium: 4.2 mmol/L (ref 3.6–5.1)
Sodium: 139 mmol/L (ref 136–144)
Total Protein: 6.7 gm/dL (ref 6.1–7.9)

## 2007-05-21 LAB — ADIF: Plt Est: LOW

## 2007-05-21 LAB — PROTHROMBIN TIME, BLOOD
INR: 1.4
PT,Patient: 16 s — ABNORMAL HIGH (ref 9.7–12.5)

## 2007-05-21 LAB — APTT, BLOOD: PTT: 31.3 s (ref 25.0–34.0)

## 2007-05-21 LAB — LIVER PANEL, BLOOD: Bilirubin, Dir: 0.3 mg/dL (ref 0.1–0.5)

## 2007-05-21 LAB — ALPHA FETOPROTEIN, SERUM: AFP: <2 ng/mL (ref <8)

## 2007-06-10 ENCOUNTER — Encounter (HOSPITAL_BASED_OUTPATIENT_CLINIC_OR_DEPARTMENT_OTHER): Payer: Self-pay | Admitting: Gastroenterology

## 2007-06-10 MED ORDER — GLUCOPHAGE 500 MG OR TABS
ORAL_TABLET | ORAL | Status: DC
Start: ? — End: 2007-06-28

## 2007-06-10 MED ORDER — DILANTIN 100 MG OR CAPS
ORAL_CAPSULE | ORAL | Status: DC
Start: ? — End: 2007-06-28

## 2007-06-10 MED ORDER — LACTULOSE 10 GM/15ML OR SOLN
ORAL | Status: DC
Start: ? — End: 2007-06-28

## 2007-06-10 MED ORDER — NOVOLIN 70/30 70-30 % SC SUSP
SUBCUTANEOUS | Status: DC
Start: ? — End: 2007-06-28

## 2007-06-10 MED ORDER — LASIX 40 MG OR TABS
ORAL_TABLET | ORAL | Status: DC
Start: ? — End: 2007-06-28

## 2007-06-10 MED ORDER — NEXIUM 40 MG OR CPDR
DELAYED_RELEASE_CAPSULE | ORAL | Status: DC
Start: ? — End: 2007-06-28

## 2007-06-10 MED ORDER — PROPRANOLOL HCL 10 MG OR TABS
ORAL_TABLET | ORAL | Status: DC
Start: ? — End: 2007-06-28

## 2007-06-10 MED ORDER — ALDACTONE 100 MG OR TABS
ORAL_TABLET | ORAL | Status: DC
Start: ? — End: 2007-06-28

## 2007-06-10 MED ORDER — HYDROCHLOROTHIAZIDE 25 MG OR TABS
ORAL_TABLET | ORAL | Status: DC
Start: ? — End: 2007-06-28

## 2007-06-10 MED ORDER — VITAMIN D 400 UNIT OR CAPS
ORAL_CAPSULE | ORAL | Status: DC
Start: ? — End: 2007-06-28

## 2007-06-10 MED ORDER — FERROUS SULFATE 325 (65 FE) MG OR TABS
ORAL_TABLET | ORAL | Status: DC
Start: ? — End: 2007-06-28

## 2007-06-24 ENCOUNTER — Ambulatory Visit (HOSPITAL_BASED_OUTPATIENT_CLINIC_OR_DEPARTMENT_OTHER): Admitting: Gastroenterology

## 2007-06-24 ENCOUNTER — Telehealth (INDEPENDENT_AMBULATORY_CARE_PROVIDER_SITE_OTHER): Payer: Self-pay | Admitting: Gastroenterology

## 2007-06-24 ENCOUNTER — Other Ambulatory Visit (HOSPITAL_BASED_OUTPATIENT_CLINIC_OR_DEPARTMENT_OTHER): Payer: Self-pay | Admitting: Gastroenterology

## 2007-06-24 NOTE — Telephone Encounter (Signed)
 PLS CALL PT TO SCHEDULE WITH [facility]# [phone]

## 2007-06-24 NOTE — Progress Notes (Signed)
 CLINIC: [facility] HEPATOLOGY  ATTENDING: Darci East, M.D.  DATE OF SERVICE: 06/24/2007     HEPATOLOGY PRE-TRANSPLANT CLINIC    REASON FOR VISIT: PRE-OLT FOLLOWUP    Patient Active Problem List   Diagnoses Date Noted   . Cirrhosis [571.5N] 06/24/2007     Active on the liver transplant waiting list with a blood type of O positive.  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   . Emphysema [492.8AL] 06/24/2007   . Osteopenia [733.90X] 06/24/2007   . Anemia [285.9AA] 06/10/2007   . EV (Esophageal Varices) [456.1L] 06/10/2007   . Seizure Disorder [345.90DB] 06/10/2007   . 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 HCV (Hepatitis C Virus) [070.70S] 06/10/2007     Achieved SVR.   . HTN [401.9BX] 06/10/2007       INTERVAL HISTORY:  The patient was last seen in this clinic in June 2008. In the interim the patient has not required hospitalization or emergency care. She had an abdominal CT in October that showed no lesions suspicious for [facility]. The radiologists commented on pancreatic cysts suspicious for IPMT. She denies and GI bleeding, encephalopathy or ascites.    REVIEW OF SYSTEMS:  As described above.  All other systems are negative.    Current outpatient prescriptions   Medication Sig Dispense Refill   . ALDACTONE  100 MG OR TABS 1 TAB QD       . DILANTIN  100 MG OR CAPS 1 TAB BID       . FERROUS SULFATE  325 (65 FE) MG OR TABS 1 TAB BID       . GLUCOPHAGE  500 MG OR TABS 1 TABLET TWICE DAILY WITH FOOD       . NEXIUM  40 MG OR CPDR 1 CAPSULE DAILY       . NOVOLIN  70/30 70-30 % SC SUSP 25/20UNITS QD       . PROPRANOLOL  HCL 10 MG OR TABS 1 TABLET 3 TIMES DAILY       . HYDROCHLOROTHIAZIDE  25 MG OR TABS 1 tablet daily       . LACTULOSE  10 GM/15ML OR SOLN 1 TABLESPOONFUL DAILY       . LASIX  40 MG OR TABS 1 TABLET DAILY       . VITAMIN D  400 UNIT OR CAPS 1 CAPSULE DAILY             Allergies   Allergen Reactions   . Advil (Ibuprofen Micronized)        PHYSICAL EXAM:  BP 121/60  Pulse 58  Temp (Src) 97.7 F (36.5 C) (Oral)   Resp 14  Ht 5\' 2"  (1.575 m)  Wt 153 lb (69.4 kg)  General:  Alert and oriented x 3, conversant, pleasant and appropriate.  ENT: Oropharynx is clear, mucus membranes are moist.  Pulmonary:  Clear to auscultation bilaterally.  Cardiovascular: Regular rate and rhythm. No murmurs, rubs, gallops.  GI: Abdomen soft, non-tender, non-distended.  Extremities: No lower extremity edema.    LABS:  HEP LIVER TRANSPLANT 05/21/2007 04/13/2007   WBC 3.0 (L) 3.8 (L)   HGB 13.1 13.8   HCT 35.8 (L) 37.6   PLT 48 (L) 40 (L)   INR 1.4 1.4   NA 139 140   K 4.2 3.8   CL 105 105   BI 28 30   BUN 9 9   CR 0.7 0.7   GLUC 210 (H) 145 (H)   AST 28 34   ALT 28 29   TBILI  1.5 (H) 2.2 (H)   DBILI 0.3 0.3   GGT     ALB 3.2 (L) 3.3   AFP <2    CAL 8.6 (L) 8.5 (L)       IMPRESSIONS: This is a 66 year old woman with hepatitis C related cirrhosis complicated by mild fluid retention well-controlled with diuretics as well as small esophageal varices. Overall she is doing well. She is active on the liver transplant list with a low MELD score. 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 Will refer to Dr. Pearla Bottom for possible endoscopic U/S to evaluate this.  2) Esophageal variceal surveillance. Perhaps Dr. Orion Birks can do the screening EGD at the same time as the endoscopic U/S. We will need a repeat upper endoscopy in March of 2009.  3) Hepatocellular carcinoma screening. Repeat CT 11/2006.  4) Hepatitis C infection. The patient had a sustained virologic response and we will need of follow PCRs yearly.  5) Diabetes mellitus. The patient's primary care provider is managing this.   6) Return to clinic in 8 weeks.

## 2007-06-24 NOTE — Patient Instructions (Signed)
 1) Will schedule evaluation with Dr. Pearla Bottom for your pancreas.  2) Draw blood test today.  3) Return in 8 weeks.

## 2007-06-25 NOTE — Progress Notes (Addendum)
 Comment: social work    Patient is a 66 year old Spanish speaking married female with esld who is active on the liver transplant list.  Met with her and her son, Adrain Alar, in transplant clinic 06/24/07.  Patient was alert, oriented, and cooperative.    Patient continues to live in [place] with her husband.  They live in Section 8 housing, and her husband continues to recycle bottles and can.  Patient reported this is their only source of income.  Patient and social worker discussed her applying for food stamps.  Patient reported she has applied in the past but was denied.  Patient did not know why she was denied, however, she believes it was because she was not a [place]  Citizen at that time.  Patient and social worker completed an on-line tool to estimate if she is now eligible.  According to the information provided by patient, it does appear she may be eligible.  Patient was agreeable to re-applying.  Patient also reported she may apply for SSI.    Patient reported her husband and children will rotate pre and post transplant caregiver responsibilities.  Patient has two children living in [place] who are not able to cross the border.  However, she has three children living in [place], and one living in [place] .  Social Worker and Programmer, systems discussed PFL.      Patient reported she is compliant with her medications.  However, has been resistant to start insulin  as her primary care doctor recommended.  She reported she is willing to discuss this again with her primary care doctor.  Patient continues to be on oral medications and checks her bs daily.      Patient continues to be independent with ADLs and IADLs.  However, only drives short distances.  She relies on her family to assist with transportation to medical appointments.  Patient denied any problems with her mood.  She continues to have [employer].    Will follow-up as needed.

## 2007-06-28 ENCOUNTER — Encounter (HOSPITAL_BASED_OUTPATIENT_CLINIC_OR_DEPARTMENT_OTHER): Payer: Self-pay | Admitting: Gastroenterology

## 2007-06-28 LAB — CA 19-9, BLOOD: CA 19-9: 45 U/mL — ABNORMAL HIGH (ref 0–37)

## 2007-06-28 MED ORDER — RANITIDINE HCL 300 MG OR CAPS
1.00 mg | ORAL_CAPSULE | Freq: Every evening | ORAL | Status: DC
Start: 2007-06-28 — End: 2008-05-18

## 2007-06-28 MED ORDER — OYSTER SHELL 500 MG OR TABS
ORAL_TABLET | ORAL | Status: DC
Start: 2007-06-28 — End: 2007-07-16

## 2007-06-28 MED ORDER — ALDACTONE 25 MG OR TABS
ORAL_TABLET | ORAL | Status: DC
Start: 2007-06-28 — End: 2008-04-11

## 2007-06-28 MED ORDER — ALDACTONE 25 MG OR TABS
ORAL_TABLET | ORAL | Status: AC
Start: 2007-06-28 — End: 2007-09-02

## 2007-06-28 MED ORDER — LASIX 40 MG OR TABS
ORAL_TABLET | ORAL | Status: DC
Start: 2007-06-28 — End: 2008-04-11

## 2007-06-28 MED ORDER — GLYBURIDE 5 MG OR TABS
ORAL_TABLET | ORAL | Status: DC
Start: 2007-06-28 — End: 2008-04-12

## 2007-06-28 MED ORDER — PROPRANOLOL HCL 20 MG OR TABS
1.00 | ORAL_TABLET | Freq: Two times a day (BID) | ORAL | Status: DC
Start: 2007-06-28 — End: 2009-02-22

## 2007-06-29 ENCOUNTER — Encounter (INDEPENDENT_AMBULATORY_CARE_PROVIDER_SITE_OTHER): Payer: Self-pay | Admitting: Gastroenterology

## 2007-06-29 ENCOUNTER — Ambulatory Visit (INDEPENDENT_AMBULATORY_CARE_PROVIDER_SITE_OTHER): Admitting: Gastroenterology

## 2007-06-29 NOTE — Progress Notes (Signed)
 GI CONSULTATION    REFERRING PHYSICIAN:  Darci East, M.D.    REASON FOR REFERRAL:  Pancreatic cysts    This is a 66 year old woman with Hep C being evaluated for liver transplant who on liver screening CT was incidentally found to have multiple small pancreatic cysts felt consistent with either microcystic serous cystadenoma or branch type IPMN.  Main PD normal.  No change since 02/02/04 study.  Patient denies personal or family history of pancreatic problems.  Denies history of heavy ETOH abuse.    Past Medical History   Diagnosis Date   . Migraine Headache    . Glaucoma    . Ascites 06/29/2007     History of paracentesis x 2   . Pancreatic Cyst 06/29/2007   . Cirrhosis 06/24/2007     Active on the liver transplant waiting list with a blood type of O positive. 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 Osteopenia 06/24/2007   . Emphysema 06/24/2007   . EV (Esophageal Varices) 06/10/2007   . Anemia 06/10/2007   . HCV (Hepatitis C Virus) 06/10/2007     Achieved SVR.   [name] DM Circ Dis Type II 06/10/2007   . HTN 06/10/2007   . HTN 06/10/2007       Past Surgical History   Procedure Date   . Lap,cholecystectomy/explore 1970s     in [place];  open CCX   . Cesarean delivery only    . Revision of bladder/urethra      Bladder suspension       Current outpatient prescriptions   Medication Sig   . ALDACTONE  25 MG OR TABS 4 TABLETS BY MOUTH EACH MORNING   . ALDACTONE  25 MG OR TABS 2 TABLETS BY MOUTH EACH EVENING   . LASIX  40 MG OR TABS 1 TABLET TWICE A DAY   . GLYBURIDE  5 MG OR TABS 1 TABLET TWICE A DAY   . RANITIDINE  HCL 300 MG OR CAPS 1 CAPSULE TWICE A DAY   . PROPRANOLOL  HCL 20 MG OR TABS 1 TABLET 3 TIMES DAILY   . OYSTER SHELL 500 MG OR TABS 1 TAB TWICE A DAY       Allergies   Allergen Reactions   . Advil (Ibuprofen Micronized)        No family history on file.  History   Social History   . Marital Status: Married     Spouse Name: N/A     Number of Children: N/A   . Years of Education: N/A   Social  History Main Topics   . Tobacco Use: Quit -- for 10 years   . Alcohol Use: No   . Drug Use: Not on file   . Sexually Active: Not on file   Other Topics Concern   . Not on file   Social History Narrative   . No narrative on file       Review of Systems -   Constitutional: negative.  CV: negative.  Resp: negative.  GI: negative.  Remainder of ROS negative or as described above    BP 122/58  Pulse 68  Ht 5\' 2"  (1.575 m)  Wt 153 lb 8 oz (69.627 kg)    Physical Exam:   General Appearance: healthy, alert, no distress, pleasant affect, cooperative.  Eyes:  conjunctivae and corneas clear. PERRL, EOM's intact. Fundi benign.  Heart:  normal rate and regular rhythm, no murmurs, clicks, or gallops.  Lungs: clear to auscultation and  percussion, no chest deformities noted.  Abdomen: BS normal.  Abdomen soft, non-tender.  No masses or organomegaly.  Extremities:  no cyanosis, clubbing, or edema.  Skin:  + palmar erythema, few spider angioma on chest.  Mental Status: Appearance/Cooperation: in no apparent distress.  Ox3.  Grossly non-focal neurologic exam.      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  We should do EGD/EUS to further characterize, especially as she may undergo future liver transplant.  The patient and her son were explained the risks, benefits, and alternatives and would like to proceed.  2)  History of esophageal varices.  We can do variceal screening and band ligation if needed at that time.  3)  Cirrhosis due to hepatitis C.    PLAN:  1)  EGD/EUS to be scheduled at the patient's convenience in the next month.

## 2007-07-12 ENCOUNTER — Other Ambulatory Visit (HOSPITAL_BASED_OUTPATIENT_CLINIC_OR_DEPARTMENT_OTHER): Payer: Self-pay | Admitting: Hepatology

## 2007-07-12 LAB — ADIF: Plt Est: LOW

## 2007-07-12 LAB — COMPREHENSIVE METABOLIC PANEL, BLOOD
ALT (SGPT): 27 IU/L (ref 14–54)
AST (SGOT): 31 IU/L (ref 15–41)
Albumin: 3.3 gm/dL (ref 3.3–5.0)
Alkaline Phos: 79 IU/L (ref 38–126)
BUN: 8 mg/dL (ref 8–20)
Bicarbonate: 28 mmol/L (ref 22–32)
Bilirubin, Tot: 2.2 mg/dL — ABNORMAL HIGH (ref <1.2)
Calcium: 8.6 mg/dL — ABNORMAL LOW (ref 8.9–10.3)
Chloride: 105 mmol/L (ref 101–111)
Creatinine: 0.7 mg/dL (ref 0.6–1.1)
GFR (African Amer.): >60 mL/min
GFR: >60 mL/min
Glucose: 148 mg/dL — ABNORMAL HIGH (ref 79–115)
Potassium: 4.2 mmol/L (ref 3.6–5.1)
Sodium: 138 mmol/L (ref 136–144)
Total Protein: 7.6 gm/dL (ref 6.1–7.9)

## 2007-07-12 LAB — CBC WITH DIFF, BLOOD
Basophils: 2 % (ref 0–2)
Eosinophils: 1 % (ref 1–3)
Hct: 38.5 % (ref 36.0–46.0)
Hgb: 13.3 gm/dL (ref 12.0–16.0)
Lymphocytes: 21 % (ref 20–40)
MCH: 33.6 pg — ABNORMAL HIGH (ref 27–31)
MCHC: 34.6 % (ref 32–37)
MCV: 97.2 um3 (ref 82.0–98.0)
MPV: 9.9 fl (ref 7.4–10.4)
Monocytes: 12 % — ABNORMAL HIGH (ref 1–10)
Plt Count: 39 10*3/uL — ABNORMAL LOW (ref 130–400)
RBC: 3.95 10*6/uL — ABNORMAL LOW (ref 4.00–5.00)
RDW: 16.2 % — ABNORMAL HIGH (ref 10–15)
Segs: 65 % (ref 45–70)
WBC: 2.8 10*3/uL — ABNORMAL LOW (ref 4.0–11.0)

## 2007-07-12 LAB — LIVER PANEL, BLOOD: Bilirubin, Dir: 0.3 mg/dL (ref 0.1–0.5)

## 2007-07-12 LAB — APTT, BLOOD: PTT: 32 s (ref 25.0–34.0)

## 2007-07-12 LAB — PROTHROMBIN TIME, BLOOD
INR: 1.4
PT,Patient: 16 s — ABNORMAL HIGH (ref 9.7–12.5)

## 2007-07-16 ENCOUNTER — Encounter (HOSPITAL_BASED_OUTPATIENT_CLINIC_OR_DEPARTMENT_OTHER): Payer: Self-pay | Admitting: Adult Health

## 2007-07-16 ENCOUNTER — Telehealth (HOSPITAL_BASED_OUTPATIENT_CLINIC_OR_DEPARTMENT_OTHER): Payer: Self-pay | Admitting: Adult Health

## 2007-07-16 MED ORDER — CALCIUM CARBONATE 600 MG OR TABS
ORAL_TABLET | ORAL | Status: AC
Start: 2007-07-16 — End: ?

## 2007-07-16 MED ORDER — VITAMIN D 400 UNIT OR CAPS
ORAL_CAPSULE | ORAL | Status: AC
Start: 2007-07-16 — End: ?

## 2007-07-16 NOTE — Telephone Encounter (Signed)
 Calcium  plus D is not covered by [employer].  Will order seperately.

## 2007-07-20 ENCOUNTER — Other Ambulatory Visit (HOSPITAL_BASED_OUTPATIENT_CLINIC_OR_DEPARTMENT_OTHER): Payer: Self-pay | Admitting: Adult Health

## 2007-07-20 NOTE — Progress Notes (Addendum)
 Addended by: Ernst Heap on: 07/20/2007 2:07:44 PM     Modules accepted: Orders

## 2007-07-21 NOTE — Progress Notes (Signed)
 CLINIC: [facility] GASTROENTEROLOGY    REPORT TYPE: NOTE    Dictating Practitioner: Georgeann Kindred. Dominga Mcduffie, N.P.     Staff Physician: Yuko Kono, M.D.    DATE OF SERVICE: 07/16/2007    REASON FOR VISIT: LIVER CANCER GROUP CASE REVIEW      REFERRING PHYSICIAN: Darci East, M.D.    ATTENDING RADIOLOGIST: Claude Sirlin, M.D.    HISTORY OF THE PRESENT ILLNESS: This is a 66 year old female with  end-stage liver disease secondary to hepatitis C. Also a history of  diabetes mellitus, pulmonary hypertension, esophageal varices, and hepatic  encephalopathy. The patient is currently on the Transplant Division and is  listed. She has not had any interventional radiology procedures.    LABORATORY STUDIES: Total bilirubin 1.5, creatinine 0.7, albumin  3.2,  platelet count 48, INR 1.1.    REVIEW: Abdominal CT 05/26/07.    EVALUATE: pancreatic cyst characters, MPV patency, and also comment on  overall hepatic characteristics.    FINDINGS:   1. Cirrhotic liver, no focal mass, LIRADS 0.   2. Intact main portal vein, intrahepatic portal vein branches, portal   confluence, SPV, SMV, normal hepatic artery and branches.   3. Pancreas: multiple cysts in head, uncinate process and proximal body   with dilatation of main PD in the body and tail of pancreas. No   significant glandular atrophy or infiltrate. Pancreatic IPMT. No   change since 11/15/06, 09/07, and 02/07.    RECOMMENDATIONS:   1. Recommend EUS.   2. Continue follow up in Transplant Division.            Job Number [id number]            Reviewed & Electronically Signed  Georgeann Kindred. Nabilah Davoli, N.P. 07/19/2007 02:03 P    Edited and Signature Derived From Controlled Access Password  Zigmund Hills, M.D. 07/21/2007 01:51 P    DD: 07/16/2007 DT: 07/16/2007 02:40 P DocNo.: [id number]  [id number]      Primary Care Physician:  VALDERRAMA ARTURO M.D.  [address]  [place], [place] [address]    cc:: Darci East, M.D.   Fax Recipient [phone]   [facility]   [place] [address]     Tena Feeling.  Sirlin, M.D.   Fax Recipient [phone]   [facility] Dept Of Radio   [place] [address]

## 2007-07-26 ENCOUNTER — Other Ambulatory Visit (HOSPITAL_BASED_OUTPATIENT_CLINIC_OR_DEPARTMENT_OTHER): Payer: Self-pay | Admitting: Adult Health

## 2007-07-26 DIAGNOSIS — M810 Age-related osteoporosis without current pathological fracture: Secondary | ICD-10-CM

## 2007-08-03 ENCOUNTER — Ambulatory Visit (HOSPITAL_BASED_OUTPATIENT_CLINIC_OR_DEPARTMENT_OTHER): Payer: Self-pay | Admitting: Gastroenterology

## 2007-08-16 ENCOUNTER — Other Ambulatory Visit (HOSPITAL_BASED_OUTPATIENT_CLINIC_OR_DEPARTMENT_OTHER): Payer: Self-pay | Admitting: Gastroenterology

## 2007-08-16 LAB — COMPREHENSIVE METABOLIC PANEL, BLOOD
ALT (SGPT): 31 IU/L (ref 14–54)
AST (SGOT): 32 IU/L (ref 15–41)
Albumin: 3.6 gm/dL (ref 3.3–5.0)
Alkaline Phos: 80 IU/L (ref 38–126)
BUN: 10 mg/dL (ref 8–20)
Bicarbonate: 31 mmol/L (ref 22–32)
Bilirubin, Tot: 1.8 mg/dL — ABNORMAL HIGH (ref <1.2)
Calcium: 9.1 mg/dL (ref 8.9–10.3)
Chloride: 102 mmol/L (ref 101–111)
Creatinine: 0.8 mg/dL (ref 0.6–1.1)
GFR (African Amer.): >60 mL/min
GFR: >60 mL/min
Glucose: 196 mg/dL — ABNORMAL HIGH (ref 79–115)
Potassium: 4.9 mmol/L (ref 3.6–5.1)
Sodium: 140 mmol/L (ref 136–144)
Total Protein: 7.5 gm/dL (ref 6.1–7.9)

## 2007-08-16 LAB — APTT, BLOOD: PTT: 29.9 s (ref 25.0–34.0)

## 2007-08-16 LAB — ADIF: Plt Est: LOW

## 2007-08-16 LAB — CBC WITH DIFF, BLOOD
Basophils: 3 % — ABNORMAL HIGH (ref 0–2)
Eosinophils: 1 % (ref 1–3)
Hct: 42.5 % (ref 36.0–46.0)
Hgb: 14.7 gm/dL (ref 12.0–16.0)
Lymphocytes: 16 % — ABNORMAL LOW (ref 20–40)
MCH: 33.7 pg — ABNORMAL HIGH (ref 27–31)
MCHC: 34.5 % (ref 32–37)
MCV: 97.8 um3 (ref 82.0–98.0)
MPV: 9.4 fl (ref 7.4–10.4)
Monocytes: 13 % — ABNORMAL HIGH (ref 1–10)
Plt Count: 48 10*3/uL — ABNORMAL LOW (ref 130–400)
RBC: 4.34 10*6/uL (ref 4.00–5.00)
RDW: 15.4 % — ABNORMAL HIGH (ref 10–15)
Segs: 67 % (ref 45–70)
WBC: 3.7 10*3/uL — ABNORMAL LOW (ref 4.0–11.0)

## 2007-08-16 LAB — PROTHROMBIN TIME, BLOOD
INR: 1.4
PT,Patient: 15.6 s — ABNORMAL HIGH (ref 9.7–12.5)

## 2007-08-16 LAB — ALPHA FETOPROTEIN, SERUM: AFP: 2 ng/mL (ref <8)

## 2007-08-16 LAB — LIVER PANEL, BLOOD: Bilirubin, Dir: 0.3 mg/dL (ref 0.1–0.5)

## 2007-08-25 ENCOUNTER — Encounter (HOSPITAL_BASED_OUTPATIENT_CLINIC_OR_DEPARTMENT_OTHER): Payer: Self-pay | Admitting: Gastroenterology

## 2007-09-02 ENCOUNTER — Ambulatory Visit (HOSPITAL_BASED_OUTPATIENT_CLINIC_OR_DEPARTMENT_OTHER): Admitting: Gastroenterology

## 2007-09-02 MED ORDER — PEG 3350-KCL-NABCB-NACL-NASULF 227.1 GM OR PACK
4.0000 L | PACK | Freq: Once | ORAL | Status: DC
Start: 2007-09-02 — End: 2007-12-09

## 2007-09-02 MED ORDER — FOSAMAX 70 MG OR TABS
70.00 mg | ORAL_TABLET | ORAL | Status: DC
Start: ? — End: 2008-04-11

## 2007-09-02 NOTE — Patient Instructions (Signed)
 GI department will schedule your appointment,  I ordered a prep which will you take the day before the procedure.  Start the prep around 5 or 6 pm the day before.

## 2007-09-02 NOTE — Progress Notes (Signed)
 Attending Note:    Subjective:  I reviewed the history.  Patient interviewed and examined.  History of present illness (HPI):  Follow-up HCV CIRRHOSIS     Review of Systems (ROS): As per the nurse practitioner's note.  Past Medical, Family, Social History:  As per the nurse practitioner's  note.    Objective:   I have examined the patient and I concur with the nurse practitioner's exam.  Assessment and plan reviewed with the nurse practitioner.  I agree with the nurse practitioner's plan as documented.    See the nurse practitioner's note for further details.    This is a pleasant 67 year old woman with HCV cirrhosis fairly well compensated who is active on the liver transplant list. 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 She is otherwise doing well. The likely IPMT is not a barrier to her transplant candidacy and we will continue to monitor it with serial imaging studies. In the interim I agree with colonoscopy for colorectal cancer screening and to continue with routine varices and HCC screening.

## 2007-09-02 NOTE — Progress Notes (Signed)
Visit practitioner:  Doralee Albino. Sherran Margolis  Staff physician:  Leone Payor, MD    Reason for visit:     Patient Active Problem List   Diagnoses Date Noted    Osteoporosis [733.00C] 07/26/2007    Ascites [789.59K] 06/29/2007     History of paracentesis x 2    Pancreatic Cyst [577.2G] 06/29/2007    Cirrhosis [571.5N] 06/24/2007     Active on the liver transplant waiting list with a blood type of O positive.  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  Last seen in our clinic 06/24/07.  Today, the patient is complaining of some occasional nausea but attributes that to her dietary intake of hotdogs. Also, states her BM's very dark brown but no blood noted.    Review of systems:       Confusion: Occasionally       Jaundice:  Denies       Shortness of breath:  Denies       Hematemesis:  Denies       Increased abdominal girth:  Denies       Melena:  Denies       Peripheral edema:  Denies    Current Medications:    VITAMIN D 400 UNIT OR CAPS 1 CAPSULE DAILY   CALCIUM CARBONATE 600 MG OR TABS 1 tablet 2 times daily   ALDACTONE 25 MG OR TABS 4 TABLETS BY MOUTH EACH MORNING   LASIX 40 MG OR TABS 1 TABLET TWICE A DAY   GLYBURIDE 5 MG OR TABS 1 TABLET TWICE A DAY   RANITIDINE HCL 300 MG OR CAPS 1 CAPSULE TWICE A DAY   PROPRANOLOL HCL 20 MG OR TABS 1 TABLET 3 TIMES DAILY   ALDACTONE 25 MG OR TABS 2 TABLETS BY MOUTH EACH EVENING         Physical examination:    BP 113/48   Pulse 69   Temp (Src) 97.9 F (36.6 C) (Oral)   Resp 14   Ht 5\' 3"   (1.6 m)   Wt 153 lb (69.4 kg)  General:  Alert and oriented x 3  Affect:  Normal  Nutrition:  Weight is stable. Wears dentures  Hepatic encephalopathy:  Stage 0  HEENT:  No scleral icterus, no conjunctival pallor, mucus membranes moist  Lungs:  Clear to auscultation bilaterally  Cardiovascular:  regular rate and rhythm   Abdomen:  Soft , bowel sounds present x 4, liver palable, spleen nonpalpable, no ascites  Extremities:  No peripheral edema  Skin:  Non-icteric, no palmar erythema, facial spider angiomas, and no visible rash  Neuro:  No asterixis    Laboratory studies:  HEP LIVER TRANSPLANT 08/16/2007   WBC 3.7 (L)   SEGS    BANDS    HGB 14.7   HCT 42.5   PLT 48 (L)   INR 1.4   NA 140   K  4.9   CL 102   BI 31   BUN 10   CR 0.8   GLUC 196 (H)   AST 32   ALT 31   TBILI 1.8 (H)   DBILI 0.3   GGT    ALB 3.6   AFP 2   MAG    PHOS    CAL 9.1   UR    TSH    T4    CHOLESTEROL    TRIGLYCERIDES    HEP C RNA QUANT    HEP B AG        Diagnostic studies: CT Abdomen 05/26/07 IMPRESSION:     1. No hypervascular hepatic lesions to suggest HCC    2. Cirrhotic liver with sequelae of portal hypertension    3. Circum-aortic left renal vein    4. Splenomegaly, stable with medial and downward displacement of the left   kidney    5. Multiple pancreatic cysts; these appear stable since 02/02/2004 study; the   stable appearance of these pancreatic cysts is most consistent with multiple   pancreatic side branch IPMT.    EGD  12/123/08 :FINDINGS  Upper endoscopy revealed trace esophageal varices  and no gastric varices. Otherwise normal upper  endoscopy including the stomach and duodenum to  the second portion. Olympus electronic radial  array EUS revealed a pancreas with multiple  anechoic cysts mostly located in the pancreatic  body. The cysts seemed to abut the pancreatic duct.  The largest cyst measured 20 mm x 14 mm in the  body of the pancreas. No masses were seen in the  pancreas or associated with these cysts. The  pancreatic duct  measured 3.5 mm in the body of the  pancreas and did not clearly communicate with  these cysts. The peri-pancreatic blood vessels  appeared normal and no peri-pancreatic lymph  nodes were seen. The gallbladder was not seen.  The bile duct measured 7 mm. Endoscopically the  ampulla appeared normal. The peri-pancreatic  blood vessels were normal exept for a prominent  portal vein/SMV confluence.    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 The largest cyst measured 20 mm  x 14 mm. No associated masses were seen to  suggest malignancy. The ampulla appeared normal.  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 Less  likely this would be multiple mucinous cyst  adenomas.  2) Trace esophageal varices.  3) No gallbladder.    RECOMMENDATIONS  1. Consider pancreatic MRI/MRCP in 6 months to  follow these pancreatic cysts for interval change.  2. Further recommendations as per Dr. Lissa Merlin.      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  On transplant list with low biological MELD of 13.    1. HCC screening:  CT of Abd. 10/08 no HCC noted.  Check AFP and CT of Abd or MRI q 6 months  2. Esophageal varices screening:  EGD 12/08 Trace esophageal varices.  Continue Propranolol as prescribed.  Routine follow up q 6 months for pancreatic cysts to monitor for any interval changes.  3. Ascites and lower leg edema:  Continue diuretics as prescribed, low sodium diet of 2000 mg.  4.  DM 2: FS daily, 104 to 200 in a.m.  Continue Glyburide.  Needs annual eye screening and will order  today.  Continue to follow up with PCP for ongoing DM care.  5. Health maintenance:  Flu vaccine with PCP done, needs colonoscopy for cancer screening.  Will order today  6. Return to clinic in 12 weeks.

## 2007-09-07 NOTE — Progress Notes (Addendum)
 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  Writer assessed pt back in 2/08 and pt demonstrated well-nourished per Subjective Global Assessment findings.      Previous Nutrition Diagnosis: Excessive CHO intake related to food and nutrition knowledge deficit as evidenced by elevated serum glu (per lab), overweight and excessive overall Calorie intake.      Current Body mass index is 27.10 kg/(m^2).  Waist circumference remeasured: 36".    Pt reports elevated fasting BG at times but does not know A1C nor did pt understand definition of A1C.  Pt reports that PCP has told her she "needs insulin " but pt "afraid" as she has required insulin  before and did not like multiple injections.      Labs (08/16/07): Alb 3.6; Glu 196; AST 32; ALT 31; Hgb 14.7  Pt stil reports eating sufficient protein and has decreased quantity of CHO at mealtimes.  SGA: Level 1 Well Nourished as evidenced by stable weight, insignificant muscle loss, improved albumin .      Intervention:  1)Advised pt of potential complications of inadequately treated diabetes.  Pt verbalizes understanding and reports has heard of these before.  Advised pt that sometimes one daily injection basal insulin  is only required for improved glucose management.  2)Recommended pt ask PCP to report to her current A1C levels.  Provided pt with Spanish booklet about A1C and its value.  Encouraged pt to be active in diabetes management.    3)Continue controlled CHO diet including HBV lean protein sources at each meal.      Provided pt with RD contact information and encouraged to call with any questions.

## 2007-10-06 ENCOUNTER — Telehealth (HOSPITAL_BASED_OUTPATIENT_CLINIC_OR_DEPARTMENT_OTHER): Payer: Self-pay | Admitting: Gastroenterology

## 2007-10-06 ENCOUNTER — Other Ambulatory Visit: Payer: Self-pay | Admitting: Internal Medicine

## 2007-10-06 NOTE — Telephone Encounter (Signed)
 LMOVM  TO CONFIRM APPT FOR COLON ON 2/27

## 2007-10-08 ENCOUNTER — Ambulatory Visit (HOSPITAL_BASED_OUTPATIENT_CLINIC_OR_DEPARTMENT_OTHER): Payer: Self-pay | Admitting: Gastroenterology

## 2007-10-13 ENCOUNTER — Other Ambulatory Visit (HOSPITAL_BASED_OUTPATIENT_CLINIC_OR_DEPARTMENT_OTHER): Payer: Self-pay | Admitting: Hepatology

## 2007-10-13 LAB — CBC WITH DIFF, BLOOD
Basophils: 1 % (ref 0–2)
Eosinophils: 2 % (ref 1–3)
Hct: 38.2 % (ref 36.0–46.0)
Hgb: 13.1 gm/dL (ref 12.0–16.0)
Lymphocytes: 23 % (ref 20–40)
MCH: 33.5 pg — ABNORMAL HIGH (ref 27–31)
MCHC: 34.4 % (ref 32–37)
MCV: 97.2 um3 (ref 82.0–98.0)
MPV: 8.4 fl (ref 7.4–10.4)
Monocytes: 10 % (ref 1–10)
Plt Count: 48 10*3/uL — ABNORMAL LOW (ref 130–400)
RBC: 3.92 10*6/uL — ABNORMAL LOW (ref 4.00–5.00)
RDW: 13.1 % (ref 10–15)
Segs: 64 % (ref 45–70)
WBC: 2.9 10*3/uL — ABNORMAL LOW (ref 4.0–11.0)

## 2007-10-13 LAB — COMPREHENSIVE METABOLIC PANEL, BLOOD
ALT (SGPT): 29 IU/L (ref 14–54)
AST (SGOT): 29 IU/L (ref 15–41)
Albumin: 3.3 gm/dL (ref 3.3–5.0)
Alkaline Phos: 85 IU/L (ref 38–126)
BUN: 11 mg/dL (ref 8–20)
Bicarbonate: 28 mmol/L (ref 22–32)
Bilirubin, Tot: 1.7 mg/dL — ABNORMAL HIGH (ref <1.2)
Calcium: 9.1 mg/dL (ref 8.9–10.3)
Chloride: 106 mmol/L (ref 101–111)
Creatinine: 0.6 mg/dL (ref 0.6–1.1)
GFR (African Amer.): >60 mL/min
GFR: >60 mL/min
Glucose: 205 mg/dL — ABNORMAL HIGH (ref 79–115)
Potassium: 4.5 mmol/L (ref 3.6–5.1)
Sodium: 141 mmol/L (ref 136–144)
Total Protein: 7.3 gm/dL (ref 6.1–7.9)

## 2007-10-13 LAB — APTT, BLOOD: PTT: 33.9 s (ref 25.0–34.0)

## 2007-10-13 LAB — LIVER PANEL, BLOOD: Bilirubin, Dir: 0.2 mg/dL (ref 0.1–0.5)

## 2007-10-13 LAB — PROTHROMBIN TIME, BLOOD
INR: 1.4
PT,Patient: 16.5 s — ABNORMAL HIGH (ref 9.7–12.5)

## 2007-10-13 LAB — ADIF: Plt Est: LOW

## 2007-11-12 ENCOUNTER — Telehealth (HOSPITAL_BASED_OUTPATIENT_CLINIC_OR_DEPARTMENT_OTHER): Payer: Self-pay

## 2007-11-15 ENCOUNTER — Telehealth (HOSPITAL_BASED_OUTPATIENT_CLINIC_OR_DEPARTMENT_OTHER): Payer: Self-pay

## 2007-11-15 NOTE — Telephone Encounter (Signed)
Patient is a 67 year old Spanish speaking female with esld who is active on the liver transplant list.  Phone call to her through the Language Line to update her psychosocial situation.      Patient reported she is doing well.  She continues to live with her husband in a single story duplex.  Her husband continues to recycle bottles and cans to help meet expenses.  Patient has applied for food stamps and is waiting to hear if she has been approved.  Patient continues to be independent with all care but prefers to drive only to the store.  Therefore, she relies on her husband and children for most transportation needs.  Patient reported taking medications as prescribed and checks her blood sugar daily.  Patient continues to identify her husband and children as pre and post transplant caregivers.  She reported they will rotate responsibilities and did not express any concerns with having appropriate care.      Patient denied any alcohol or illegal drug use.  She also denied tobacco use.  Patient reported she is coping well with no concerns with her mood.      Will follow-up as needed and remain available.

## 2007-12-09 ENCOUNTER — Ambulatory Visit (HOSPITAL_BASED_OUTPATIENT_CLINIC_OR_DEPARTMENT_OTHER): Admitting: Gastroenterology

## 2007-12-09 NOTE — Patient Instructions (Signed)
 1) Labs when you have time. Within the next 7 days.  2) Will schedule liver CT.  3) Will schedule EGD.  4) Will refer to eye clinic.

## 2007-12-09 NOTE — Progress Notes (Signed)
CLINIC: [facility]  ATTENDING: Leone Payor, M.D.  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     HTN [401.9BX] 06/10/2007            INTERVAL HISTORY:  The patient was last seen in this clinic in months ago. In the interim the patient has not required hospitalization or emergency care. She feels well. C/o mild cough, non-pruductive, no fevers or chills. Denies GI bleeding, ascites, or encephalopathy.    REVIEW OF SYSTEMS:    Confusion: No  Abdominal distention: No  Melena: No  Hematemesis: No  Hematochezia: No  All other systems negative.       Current outpatient prescriptions   Medication Sig Dispense Refill    FOSAMAX 70 MG TABS Take 70 mg by mouth every 7 days.         VITAMIN D 400 UNIT OR CAPS 1 CAPSULE DAILY  30  11    CALCIUM CARBONATE 600 MG OR TABS 1 tablet 2 times daily  one month  11    ALDACTONE 25 MG OR TABS 4 TABLETS BY MOUTH EACH MORNING  0  0    LASIX 40 MG OR TABS 1 TABLET TWICE A DAY  0  0    GLYBURIDE 5 MG OR TABS 1 TABLET TWICE A DAY  0  0    RANITIDINE HCL 300 MG OR CAPS 1 CAPSULE TWICE A DAY  0  0    PROPRANOLOL HCL 20 MG OR TABS 1 TABLET 3 TIMES DAILY  0  0    DISCONTD: PEG 3350-KCL-NABCB-NACL-NASULF  (GOLYTELY) 227.1 GM suspension Take 4 L by mouth once.   1  0         ALLERGIES: Allergies   Allergen Reactions    Advil (Ibuprofen Micronized)            PHYSICAL EXAM:  BP 135/67   Pulse 67   Temp(Src) 98.9 F (37.2 C) (Oral)   Resp 16   Ht 5\' 5"  (1.651 m)   Wt 151 lb (68.493 kg)  General:  Alert and oriented x 3, conversant, pleasant and appropriate.  ENT: Oropharynx is clear, mucus membranes are moist.  Pulmonary:  Clear to auscultation bilaterally.  Cardiovascular: Regular rate and rhythm. No murmurs, rubs, gallops.  GI: Abdomen soft, non-tender, non-distended.  Extremities: No lower extremity edema.    LABS:         10/13/2007 08/16/2007  SEGS     BANDS     HGB 13.1 14.7  HCT 38.2 42.5   PLT 48 (L) 48 (L)   INR 1.4 1.4   NA 141 140   K 4.5 4.9   CL 106 102   BI 28 31   BUN 11 10   CR 0.6 0.8   GLUC 205 (H) 196 (H)   AST 29 32   ALT 29 31   TBILI 1.7 (H) 1.8 (H)   DBILI 0.2 0.3   GGT     ALB 3.3 3.6   AFP  2   MAG     PHOS     CAL 9.1 9.1       IMPRESSIONS: This is a pleasant 67 year old woman with HCV cirrhosis (achieved SVR) fairly well compensated who is active on the liver transplant list. 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 She is otherwise doing well. The likely IPMT is not a barrier to her transplant candidacy and we will continue to monitor it with serial imaging studies.     1) Varices screening. Will schedule EGD now.  2) HCC screening. Will order triphasic abd CT now.  3) Fluid retention. Well controlled with low dose diuretics.  4) Diabetes. On oral hypoglycemic. Will refer to Ophthalmology for annual eye exam.  5) Return in 3 months.

## 2007-12-10 ENCOUNTER — Other Ambulatory Visit (HOSPITAL_BASED_OUTPATIENT_CLINIC_OR_DEPARTMENT_OTHER): Payer: Self-pay | Admitting: Adult Health

## 2007-12-13 ENCOUNTER — Other Ambulatory Visit (HOSPITAL_BASED_OUTPATIENT_CLINIC_OR_DEPARTMENT_OTHER): Payer: Self-pay | Admitting: Gastroenterology

## 2007-12-13 LAB — CBC WITH DIFF, BLOOD
Basophils: 2 % (ref 0–2)
Eosinophils: 2 % (ref 1–3)
Hct: 37.4 % (ref 36.0–46.0)
Hgb: 13 gm/dL (ref 12.0–16.0)
Lymphocytes: 24 % (ref 20–40)
MCH: 33.9 pg — ABNORMAL HIGH (ref 27–31)
MCHC: 34.8 % (ref 32–37)
MCV: 97.6 um3 (ref 82.0–98.0)
MPV: 8.5 fl (ref 7.4–10.4)
Monocytes: 7 % (ref 1–10)
Plt Count: 44 10*3/uL — ABNORMAL LOW (ref 130–400)
RBC: 3.83 10*6/uL — ABNORMAL LOW (ref 4.00–5.00)
RDW: 12.9 % (ref 10–15)
Segs: 65 % (ref 45–70)
WBC: 2.8 10*3/uL — ABNORMAL LOW (ref 4.0–11.0)

## 2007-12-13 LAB — PROTHROMBIN TIME, BLOOD
INR: 1.3
PT,Patient: 15.3 s — ABNORMAL HIGH (ref 9.7–12.5)

## 2007-12-13 LAB — COMPREHENSIVE METABOLIC PANEL, BLOOD
ALT (SGPT): 24 IU/L (ref 14–54)
AST (SGOT): 27 IU/L (ref 15–41)
Albumin: 3.4 gm/dL (ref 3.3–5.0)
Alkaline Phos: 83 IU/L (ref 38–126)
BUN: 12 mg/dL (ref 8–20)
Bicarbonate: 29 mmol/L (ref 22–32)
Bilirubin, Tot: 1.4 mg/dL — ABNORMAL HIGH (ref <1.2)
Calcium: 9 mg/dL (ref 8.9–10.3)
Chloride: 106 mmol/L (ref 101–111)
Creatinine: 0.6 mg/dL (ref 0.6–1.1)
GFR (African Amer.): >60 mL/min
GFR: >60 mL/min
Glucose: 191 mg/dL — ABNORMAL HIGH (ref 79–115)
Potassium: 4.5 mmol/L (ref 3.6–5.1)
Sodium: 141 mmol/L (ref 136–144)
Total Protein: 7 gm/dL (ref 6.1–7.9)

## 2007-12-13 LAB — APTT, BLOOD: PTT: 31.3 s (ref 25.0–34.0)

## 2007-12-13 LAB — ALPHA FETOPROTEIN, SERUM: AFP: 3 ng/mL (ref <8)

## 2007-12-13 LAB — ADIF: Plt Est: LOW

## 2007-12-21 ENCOUNTER — Encounter (HOSPITAL_BASED_OUTPATIENT_CLINIC_OR_DEPARTMENT_OTHER): Admitting: Ophthalmology

## 2007-12-21 ENCOUNTER — Ambulatory Visit (HOSPITAL_BASED_OUTPATIENT_CLINIC_OR_DEPARTMENT_OTHER): Admitting: Ophthalmology

## 2007-12-22 ENCOUNTER — Other Ambulatory Visit (HOSPITAL_BASED_OUTPATIENT_CLINIC_OR_DEPARTMENT_OTHER): Payer: Self-pay | Admitting: Adult Health

## 2007-12-22 NOTE — Progress Notes (Signed)
 Received note from Dr. Sharon December requesting Chest CT, PET scan and repeat colonoscopy after results of CT abdomen.  Will notify patient of up coming imaging.

## 2008-01-04 ENCOUNTER — Encounter (HOSPITAL_BASED_OUTPATIENT_CLINIC_OR_DEPARTMENT_OTHER): Admitting: Ophthalmology

## 2008-01-11 ENCOUNTER — Ambulatory Visit (HOSPITAL_BASED_OUTPATIENT_CLINIC_OR_DEPARTMENT_OTHER): Admitting: Ophthalmology

## 2008-01-19 ENCOUNTER — Other Ambulatory Visit (HOSPITAL_BASED_OUTPATIENT_CLINIC_OR_DEPARTMENT_OTHER): Payer: Self-pay | Admitting: Adult Health

## 2008-01-19 NOTE — Progress Notes (Signed)
 Updating studies for transplant listing

## 2008-01-25 ENCOUNTER — Other Ambulatory Visit (HOSPITAL_BASED_OUTPATIENT_CLINIC_OR_DEPARTMENT_OTHER): Payer: Self-pay | Admitting: Gastroenterology

## 2008-01-25 LAB — HIV 1/2 ANTIBODY & P24 ANTIGEN ASSAY, BLOOD: HIV 1/2 Antibody & P24 Antigen Assay: NONREACTIVE

## 2008-01-25 LAB — HEPATITIS B SURFACE AB, QUANT, BLOOD: HBsAb,Qt: <1 m[IU]/mL

## 2008-01-25 LAB — COMPREHENSIVE METABOLIC PANEL, BLOOD
ALT (SGPT): 29 IU/L (ref 14–54)
AST (SGOT): 29 IU/L (ref 15–41)
Albumin: 3.3 gm/dL (ref 3.3–5.0)
Alkaline Phos: 89 IU/L (ref 38–126)
BUN: 10 mg/dL (ref 8–20)
Bicarbonate: 26 mmol/L (ref 22–32)
Bilirubin, Tot: 2 mg/dL — ABNORMAL HIGH (ref <1.2)
Calcium: 8.4 mg/dL — ABNORMAL LOW (ref 8.9–10.3)
Chloride: 105 mmol/L (ref 101–111)
Creatinine: 0.7 mg/dL (ref 0.6–1.1)
GFR (African Amer.): >60 mL/min
GFR: >60 mL/min
Glucose: 152 mg/dL — ABNORMAL HIGH (ref 79–115)
Potassium: 3.7 mmol/L (ref 3.6–5.1)
Sodium: 139 mmol/L (ref 136–144)
Total Protein: 7 gm/dL (ref 6.1–7.9)

## 2008-01-25 LAB — ADIF: Plt Est: LOW

## 2008-01-25 LAB — LIVER PANEL, BLOOD: Bilirubin, Dir: 0.3 mg/dL (ref 0.1–0.5)

## 2008-01-25 LAB — CBC WITH DIFF, BLOOD
Basophils: 0 % (ref 0–2)
Eosinophils: 2 % (ref 1–3)
Hct: 36.9 % (ref 36.0–46.0)
Hgb: 13 gm/dL (ref 12.0–16.0)
Lymphocytes: 26 % (ref 20–40)
MCH: 34.1 pg — ABNORMAL HIGH (ref 27–31)
MCHC: 35.3 % (ref 32–37)
MCV: 96.6 um3 (ref 82.0–98.0)
MPV: 7.7 fl (ref 7.4–10.4)
Monocytes: 8 % (ref 1–10)
Plt Count: 44 10*3/uL — ABNORMAL LOW (ref 130–400)
RBC: 3.82 10*6/uL — ABNORMAL LOW (ref 4.00–5.00)
RDW: 13.1 % (ref 10–15)
Segs: 64 % (ref 45–70)
WBC: 2.2 10*3/uL — ABNORMAL LOW (ref 4.0–11.0)

## 2008-01-25 LAB — URINALYSIS
Bilirubin: NEGATIVE
Glucose: NEGATIVE
Ketones: NEGATIVE
Nitrite: NEGATIVE
RBC: >50 — AB (ref ?–2)
Specific Gravity: 1.019 (ref 1.002–1.030)
pH: 5.5 (ref 5.0–8.0)

## 2008-01-25 LAB — HEPATITIS A IGM & TOTAL AB  PANEL, BLOOD
Hepatitis A Antibody IGM: NONREACTIVE
Hepatitis A Antibody: REACTIVE — ABNORMAL HIGH

## 2008-01-25 LAB — HEPATITIS B CORE IGG/IGM PANEL, BLOOD
HBcAb IgG: NONREACTIVE
HBcAb IgM: NONREACTIVE

## 2008-01-25 LAB — IMMUNOGLOBULIN PANEL (IGA,IGG,IGM), BLOOD
IGA: 384 mg/dL (ref 66–436)
IGG: 2123 mg/dL — ABNORMAL HIGH (ref 791–1643)
IGM: 88 mg/dL (ref 43–279)

## 2008-01-25 LAB — APTT, BLOOD: PTT: 30.8 s (ref 25.0–34.0)

## 2008-01-25 LAB — HEPATITIS B SURFACE AG, BLOOD: HBsAg: NONREACTIVE

## 2008-01-25 LAB — FERRITIN, BLOOD: Ferritin: 145 ng/mL (ref ?–291)

## 2008-01-25 LAB — ALPHA FETOPROTEIN, SERUM: AFP: 3 ng/mL (ref <8)

## 2008-01-25 LAB — PROTHROMBIN TIME, BLOOD
INR: 1.3
PT,Patient: 15.3 s — ABNORMAL HIGH (ref 9.7–12.5)

## 2008-01-25 LAB — FREE THYROXINE, BLOOD: Free T4: 1.16 ng/dL (ref 0.90–1.80)

## 2008-01-25 LAB — CEA, BLOOD: CEA: 3.8 ng/mL

## 2008-01-25 LAB — IRON, BLOOD: Iron: 127 ug/dL (ref 28–170)

## 2008-01-25 LAB — IRON BINDING CAPACITY, BLOOD: IBC (TIBC): 245 ug/dL — ABNORMAL LOW (ref 250–450)

## 2008-01-25 LAB — TSH SENSITIVE, BLOOD: TSH Sensitive: 2.33 u[IU]/mL (ref 0.35–5.50)

## 2008-01-26 LAB — HERPES SIMPLEX VIRUS (HSV) 1/2 IGG
Herpes Simplex 1 IGG Ab: POSITIVE — ABNORMAL HIGH
Herpes Simplex 2 IGG Ab: POSITIVE — ABNORMAL HIGH

## 2008-01-26 LAB — RAPID PLASMA REAGIN, BLOOD: RPR: NONREACTIVE

## 2008-01-26 LAB — TRANSFERRIN, BLOOD: Transferrin: 172 mg/dL — ABNORMAL LOW (ref 192–382)

## 2008-01-26 LAB — HEPATITIS C RNA, QUANT BLOOD: Hepatitis C RNA Quant: <28 IU/mL

## 2008-01-27 LAB — ANA (ANTI-NUCLEAR AB), BLOOD: ANA (Anti-Nuclear Ab): POSITIVE — ABNORMAL HIGH

## 2008-01-27 LAB — ANTI-SMOOTH MUSC AB-TITER, BLOOD: Anti-Smooth Muscle Ab Titer: 1:160 {titer} — ABNORMAL HIGH

## 2008-01-27 LAB — CMV IGG/IGM AB PANEL, BLOOD
CMV IGG Ab: POSITIVE — ABNORMAL HIGH
CMV IGM Ab: NEGATIVE

## 2008-01-27 LAB — AMA (ANTI-MITOCHONDRIAL AB), BLOOD: AMA (Anti-Mitochondria Ab): NEGATIVE

## 2008-01-27 LAB — ANTI-SMOOTH MUSCLE AB, BLOOD: Anti-Smooth Muscle Ab: POSITIVE — ABNORMAL HIGH

## 2008-01-27 LAB — CERULOPLASMIN, BLOOD: Ceruloplasmin: 27 mg/dL (ref 17–54)

## 2008-01-27 LAB — ANTI-LKM ANTIBODY, BLOOD: Anti-LKM Ab: <1:20 {titer}

## 2008-01-28 ENCOUNTER — Ambulatory Visit (HOSPITAL_BASED_OUTPATIENT_CLINIC_OR_DEPARTMENT_OTHER): Payer: Self-pay | Admitting: Gastroenterology

## 2008-01-28 ENCOUNTER — Other Ambulatory Visit (HOSPITAL_BASED_OUTPATIENT_CLINIC_OR_DEPARTMENT_OTHER): Payer: Self-pay | Admitting: Gastroenterology

## 2008-01-28 LAB — CREATININE W/GFR
Creatinine: 0.7 mg/dL (ref 0.6–1.1)
GFR (African Amer.): >60 mL/min
GFR: >60 mL/min

## 2008-01-28 LAB — E.BARR VIRUS IGG/IGM PANEL, BLOOD
Epstein Barr IGG Ab: POSITIVE — ABNORMAL HIGH
Epstein Barr IGM Ab: NEGATIVE

## 2008-01-28 LAB — 24 HOUR URINE TOTAL PROTEIN
Protein Total 24hr: INVALID mg/TV (ref ?–100)
Total Protein, Urine: <6 mg/dL

## 2008-01-28 LAB — 24 HOUR CREATININE CLEARANCE
Creatinine Clear Total 24hr: 86 mL/min (ref ?–125)
Creatinine Total 24hr: 0.9 gm/TV (ref ?–1.8)
Creatinine, Urine: 36 mg/dL
Duration: 24 HOURS
Volume: 2404 mL — ABNORMAL HIGH (ref 600–1600)

## 2008-01-28 LAB — ANTI-NUCLEAR-AB-TITER, BLOOD: Anti-Nuclear Ab Titer: 1:160 {titer} — ABNORMAL HIGH

## 2008-02-01 LAB — HEPATITIS B(E) ANTIGEN, BLOOD: Hepatitis B(E) Ag: NEGATIVE

## 2008-02-01 LAB — EPG, SERUM
A/G Ratio, EPG: 0.84
Albumin, EPG: 3.19 gm/dL — ABNORMAL LOW (ref 3.20–5.00)
Alpha 1, EPG: 0.2 gm/dL (ref 0.10–0.40)
Alpha 2, EPG: 0.73 gm/dL (ref 0.60–1.10)
Beta, EPG: 0.64 gm/dL (ref 0.60–1.30)
Gamma, EPG: 2.25 gm/dL — ABNORMAL HIGH (ref 0.70–1.50)
Total Protein, EPG: 7 gm/dL (ref 6.00–8.00)

## 2008-02-01 LAB — HTLV I/II ANTIBODY, BLOOD: HTLV I/II Ab: NEGATIVE

## 2008-02-07 ENCOUNTER — Other Ambulatory Visit (INDEPENDENT_AMBULATORY_CARE_PROVIDER_SITE_OTHER): Payer: Self-pay

## 2008-02-07 ENCOUNTER — Encounter (INDEPENDENT_AMBULATORY_CARE_PROVIDER_SITE_OTHER): Payer: Self-pay | Admitting: Family

## 2008-02-10 ENCOUNTER — Encounter (INDEPENDENT_AMBULATORY_CARE_PROVIDER_SITE_OTHER): Admitting: Family

## 2008-02-10 ENCOUNTER — Ambulatory Visit (INDEPENDENT_AMBULATORY_CARE_PROVIDER_SITE_OTHER)

## 2008-02-10 ENCOUNTER — Ambulatory Visit (INDEPENDENT_AMBULATORY_CARE_PROVIDER_SITE_OTHER): Admitting: Family

## 2008-02-14 ENCOUNTER — Other Ambulatory Visit (HOSPITAL_BASED_OUTPATIENT_CLINIC_OR_DEPARTMENT_OTHER): Payer: Self-pay | Admitting: Gastroenterology

## 2008-02-14 ENCOUNTER — Ambulatory Visit (HOSPITAL_COMMUNITY)

## 2008-02-14 LAB — ARTERIAL BLOOD GAS
BE, Art: 1.1 mmol/L (ref ?–2.4)
FIO2: 21 %
HCO3, Art: 25 mmol/L (ref 20–29)
O2 Sat, Art (Est): 97.2 %
Temp: 36.2 'C
pCO2, Art (T): 36 mmHG (ref 36–46)
pCO2, Art (Uncorr): 38 mmHg
pH, Art (T): 7.45 (ref 7.35–7.46)
pH, Art (Uncorr): 7.44
pO2, Art (T): 86 mmHG (ref 74–109)
pO2, Art (Uncorr): 90 mmHg

## 2008-02-18 ENCOUNTER — Other Ambulatory Visit (HOSPITAL_BASED_OUTPATIENT_CLINIC_OR_DEPARTMENT_OTHER): Payer: Self-pay

## 2008-02-18 ENCOUNTER — Encounter (HOSPITAL_BASED_OUTPATIENT_CLINIC_OR_DEPARTMENT_OTHER): Payer: Self-pay

## 2008-02-24 ENCOUNTER — Encounter (HOSPITAL_BASED_OUTPATIENT_CLINIC_OR_DEPARTMENT_OTHER): Payer: Self-pay | Admitting: Ophthalmology

## 2008-02-25 ENCOUNTER — Ambulatory Visit (INDEPENDENT_AMBULATORY_CARE_PROVIDER_SITE_OTHER): Admitting: Ophthalmology

## 2008-03-06 ENCOUNTER — Ambulatory Visit (INDEPENDENT_AMBULATORY_CARE_PROVIDER_SITE_OTHER): Admitting: Ophthalmology

## 2008-03-07 NOTE — Op Note (Signed)
 Dictating Practitioner: Merced Hanners P. Hikeem Andersson, M.D.     Staff Physician: Kaetlin Bullen P. Brianda Beitler, M.D.    Date of Operation: 02/24/2008    PREOPERATIVE DIAGNOSIS: Cataract, right eye.  POSTOPERATIVE DIAGNOSIS: Cataract, right eye.  OPERATION: Extracapsular cataract extraction by phacoemulsification with  intraocular lens implant, right eye.  SURGEON/STAFF: Lory Rough    ANESTHESIA: Topical Marcaine 0.75% with monitored anesthesia care.  COMPLICATIONS: None.  INTRAOCULAR LENS DATA: Model [id number], power 26.0 diopters, serial number  [id number].    INDICATIONS: The patient is a 67 year old female with complaints of  decreased vision in the right eye interfering with daily activities. Her  visual acuity in the right eye is count fingers at 6 feet due to dense  combined nuclear sclerosis, cortical and posterior subcapsular cataract.    CONSENT: The risks, benefits and alternatives of cataract extraction with  lens implantation were reviewed with the patient. Opportunity for  questions was given and an informed consent was obtained.    DESCRIPTION OF PROCEDURE: The patient was taken to the Operating Room in  stable condition and placed in the supine position. Blood pressure and  pulse oximetry monitoring were established and maintained throughout the  entire case. The right eye was then prepped and draped in the usual  sterile ophthalmic fashion. The operating room microscope was used. A lid  speculum was placed between the right eyelids. The surgeon was seated at  the temporal aspect of the globe.    A 2..4 mm keratome was used to enter the anterior chamber at the temporal  corneal limbus followed by Xylocaine 1 % preservative free. . Provisc was  then placed into the anterior chamber and a paracentesis port was then made  using the 75-blade. Next, a capsulorrhexis was made using a combination of  the cystitome and Utrata forceps. Hydrodissection was then performed using  balanced salt solution placed on a cannula tip. Next,  the  phacoemulsification hand-piece was introduced into the anterior chamber and  the nucleus was sculpted into two deep troughs. The nucleus was then  cracked into four quadrants and each of the nuclear fragments was then  irrigated, aspirated and phacoemulsified. The remaining cortical material  was then removed using the automated irrigation-aspiration apparatus.  Provisc was then placed into the intact capsular bag.    The lens implant was then removed from its sterile package, inspected, and  found suitable for implantation. It was then placed into the capsular bag  under direct visualization. Miochol was then placed into the anterior  chamber to constrict the pupil and the Provisc was then removed using the  irrigating-aspirating apparatus. Next, balanced salt solution was placed  into the anterior chamber to establish physiologic pressure and the corneal  stroma was then hydrated in order to create a watertight seal. Interrupted  10-0 nylon suture was placed as necessary. The wound was checked and found  to be watertight.    A collagen eye shield soaked in TobraDex was then placed onto the right  globe and the lid speculum was removed. The right eye was then patched  with a sterile eye patch followed by a hard shield. The patient was then  taken to the Recovery Room having tolerated the procedure well without  complication.    Postoperative instructions and follow-up appointment were given to the  patient.            Electronically signed by:  Chuckie Craven, M.D. 03/07/2008 09:44 A    DD: 02/24/2008 DT: 02/24/2008 09:21  Eugenie Norrie.: [id number]  TPN/gxv     Referring Physician:   Leone Payor MD   [facility] [address] [place], [place] [address]     Primary Care Physician:   Rene Paci M.D.   [address] [place], [place] [address]    cc:

## 2008-03-09 ENCOUNTER — Ambulatory Visit (HOSPITAL_BASED_OUTPATIENT_CLINIC_OR_DEPARTMENT_OTHER): Admitting: Gastroenterology

## 2008-03-09 NOTE — Progress Notes (Signed)
 Attending Note:    Subjective:  I reviewed the history.  Patient interviewed and examined.  History of present illness (HPI):  Pre-OLT; HCV CIRRHOSIS     Review of Systems (ROS): As per the nurse practitioner's note.  Past Medical, Family, Social History:  As per the nurse practitioner's  note.    Objective:   I have examined the patient and I concur with the nurse practitioner's exam.  Assessment and plan reviewed with the nurse practitioner.  I agree with the nurse practitioner's plan as documented.    See the nurse practitioner's note for further details.    This is a pleasant 67 year old woman with HCV GT2 cirrhosis (achieved SVR) fairly well compensated who is active on the liver transplant list. 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 She is otherwise doing well. The likely IPMT is not a barrier to her transplant candidacy and we will continue to monitor it with serial imaging studies. She is doing well after recent cataract surgery but her blood sugar is high despite glyburide.  1) Varices screening. Repeat EGD next June   2) HCC screening. Repeat triphasic abd CT in December.   3) Fluid retention. Well controlled with low dose diuretics.   4) Diabetes. On oral hypoglycemic. Will refer to endocrinology for management.   5) Return in 3 months.

## 2008-03-09 NOTE — Patient Instructions (Signed)
 Follow up in 3 months  Referral to endocrinologist for Diabetes  I ordered flavored golytely Rx please go to [facility]

## 2008-03-09 NOTE — Progress Notes (Signed)
 Visit practitioner:  Georgeann Kindred. 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  Last seen in our clinic April 2009.  Today, the patient is complaining of occasional nausea. Had cataract surgery on right eye 02/24/08 and is scheduled for left eye on 03/23/08.  Denies diarrhea, vomiting, fevers, swelling and chills.  Occasional forgetfulness.  Blood sugars are running high.    Review of systems: as above, all other systems negative        Current Medications:     FOSAMAX 70 MG TABS Take 70 mg by mouth every 7 days.    VITAMIN D 400 UNIT OR CAPS 1 CAPSULE DAILY   CALCIUM CARBONATE 600 MG OR TABS 1 tablet 2 times daily   ALDACTONE 25 MG OR TABS 4 TABLETS BY MOUTH EACH MORNING   LASIX 40 MG OR TABS 1 TABLET TWICE A DAY   GLYBURIDE 5 MG OR TABS 1 TABLET TWICE A DAY   RANITIDINE HCL 300 MG OR CAPS 1 CAPSULE TWICE A DAY   PROPRANOLOL HCL 20 MG OR TABS 1 TABLET 3 TIMES DAILY         Physical examination:    BP 133/57  Pulse 68  Temp(Src) 98.8 F (37.1 C) (Oral)  Resp 16  Ht 5\' 1"  (1.549 m)  Wt  148 lb (67.132 kg)  General:  Alert and oriented x 3  Affect:  Normal  Nutrition:  Weight is stable.  Hepatic encephalopathy:  Stage 0  HEENT:  No scleral icterus, no conjunctival pallor, mucus membranes moist, neck supple without lymphadenopathy  Lungs:  Clear to auscultation bilaterally  Cardiovascular:  Regular rate and rhythm without murmurs, rubs or gallops.  Abdomen:  Bowel sounds present x 4, liver nonpalable, spleen nonpalpable, no ascites, no tenderness and no guarding  Extremities:  No peripheral edema  Skin:  Non-icteric, no palmar erythema, no spider angiomas  Neuro:  No asterixis    Laboratory studies:    Results for KYNZLEY, DOWSON (MRN [id number]) as of 03/09/2008 08:36   Ref. Range 01/25/2008 09:04   SODIUM Latest Range: 136-144 mmol/L 139   POTASSIUM Latest Range: 3.6-5.1 mmol/L 3.7   CHLORIDE Latest Range: 101-111 mmol/L 105   BICARBONATE Latest Range: 22-32 mmol/L 26   BUN Latest Range: 8-20 mg/dL 10  CREATININE Latest Range: 0.6-1.1 mg/dL 0.7   GFR (NON-AFRICAN AMER.) No range found >60   GFR (AFRICAN AMER.) No range found >60   GLUCOSE Latest Range: 79-115 mg/dL 213 (H)   CALCIUM Latest Range: 8.9-10.3 mg/dL 8.4 (L)   TOTAL PROTEIN Latest Range: 6.1-7.9 gm/dL 7.0   ALBUMIN Latest Range: 3.3-5.0 gm/dL 3.3   AST (SGOT) Latest Range: 15-41 IU/L 29   ALT (SGPT) Latest Range: 14-54 IU/L 29   ALKALINE PHOS Latest Range: 38-126 IU/L 89   BILIRUBIN, DIR Latest Range: 0.1-0.5 mg/dL 0.3   BILIRUBIN, TOT Latest Range: Low: <1.2 mg/dL 2.0 (H)   FREE T4 Latest Range: 0.90-1.80 ng/dL 0.86   TSH SENSITIVE Latest Range: 0.35-5.50 uIU/mL 2.33   ALBUMIN, EPG Latest Range: 3.20-5.00 gm/dL 5.78 (L)   ALPHA 1, EPG Latest Range: 0.10-0.40 gm/dL 4.69   ALPHA 2, EPG Latest Range: 0.60-1.10 gm/dL 6.29   BETA, EPG Latest Range: 0.60-1.30 gm/dL 5.28   GAMMA, EPG Latest Range: 0.70-1.50 gm/dL 4.13 (H)   TOTAL PROTEIN, EPG Latest Range: 6.00-8.00 gm/dL 2.44   A/G RATION, EPG No range found 0.84   INTERP, EPG No range  found ....   AFP Latest Range: Low: <8 ng/mL 3   ANTI-LKM ANTIBODY Latest Range: Low: <1:20  <1:20   CEA No range found 3.8   CERULOPLASMIN Latest Range: 17-54 mg/dL 27   FERRITIN Latest Range: Low: F:10-291 ng/mL 145   IRON Latest Range: 28-170 mcg/dL 010   IBC (TIBC) Latest Range: 250-450 mcg/dL 272 (L)   IGG Latest Range: 587-810-5213 mg/dL 5366 (H)   IGA Latest Range: 66-436 mg/dL 440   IGM Latest Range: 43-279 mg/dL 88   TRANSFERRIN Latest Range: 192-382 mg/dL 347 (L)     Diagnostic studies:  CT of abdomen 12/21/07  IMPRESSION: Multiphase CT scan with and without IV contrast.    1. Nodule assessment LIRADS 1; no arterially enhancing nodules in the liver.    2. Interval appearance of 1.2 cm right lung base pulmonary nodule. Recommend dedicated chest CT for comprehensive evaluation of the chest and further malignancy workup as indicated.    3. Cirrhosis with splenomegaly, ascites, portal colopathy, and paraesophageal varices, compatible with end-stage liver disease.    4. 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    5. Redemonstration of multiple cystic lesions of the pancreas.    6. Other ancillary findings unchanged since 05/26/07.    Assessment and plan:   Sanam Marmo is a 67 year old female with end-stage liver disease secondary to hepatitis C.  Stable and listed with a low biological MELD of 12. Patient advised    1. HCC screening:  CT of abdomen neg for HCC follow up in 11/09  2. Esophageal varices screening:  EGD 01/28/08   3. DM 2: uncontrolled, refer to endocrinologist  4. Health maintenance:  Colonoscopy scheduled for September  5. Return to clinic in 3 months

## 2008-03-14 ENCOUNTER — Other Ambulatory Visit: Payer: Self-pay | Admitting: Internal Medicine

## 2008-03-15 ENCOUNTER — Other Ambulatory Visit (HOSPITAL_BASED_OUTPATIENT_CLINIC_OR_DEPARTMENT_OTHER): Payer: Self-pay | Admitting: Adult Health

## 2008-03-16 ENCOUNTER — Encounter (HOSPITAL_BASED_OUTPATIENT_CLINIC_OR_DEPARTMENT_OTHER)

## 2008-03-16 ENCOUNTER — Other Ambulatory Visit (HOSPITAL_BASED_OUTPATIENT_CLINIC_OR_DEPARTMENT_OTHER)

## 2008-03-16 ENCOUNTER — Ambulatory Visit (INDEPENDENT_AMBULATORY_CARE_PROVIDER_SITE_OTHER): Admitting: Family

## 2008-03-16 ENCOUNTER — Ambulatory Visit (INDEPENDENT_AMBULATORY_CARE_PROVIDER_SITE_OTHER)

## 2008-03-17 ENCOUNTER — Other Ambulatory Visit (HOSPITAL_BASED_OUTPATIENT_CLINIC_OR_DEPARTMENT_OTHER): Payer: Self-pay | Admitting: Gastroenterology

## 2008-03-17 LAB — CBC WITH DIFF, BLOOD
Basophils: 0 % (ref 0–2)
Eosinophils: 2 % (ref 1–3)
Hct: 40.2 % (ref 36.0–46.0)
Hgb: 13.3 gm/dL (ref 12.0–16.0)
Lymphocytes: 25 % (ref 20–40)
MCH: 32.2 pg — ABNORMAL HIGH (ref 27–31)
MCHC: 33 % (ref 32–37)
MCV: 97.7 um3 (ref 82.0–98.0)
MPV: 8.5 fl (ref 7.4–10.4)
Monocytes: 9 % (ref 1–10)
Plt Count: 48 10*3/uL — ABNORMAL LOW (ref 130–400)
RBC: 4.11 10*6/uL (ref 4.00–5.00)
RDW: 15.6 % — ABNORMAL HIGH (ref 10–15)
Segs: 65 % (ref 45–70)
WBC: 3.4 10*3/uL — ABNORMAL LOW (ref 4.0–11.0)

## 2008-03-17 LAB — COMPREHENSIVE METABOLIC PANEL, BLOOD
ALT (SGPT): 26 U/L
AST (SGOT): 29 U/L
Albumin: 3.7 gm/dL (ref 3.5–5.2)
Alkaline Phos: 96 U/L
BUN: 13 mg/dL (ref 6–20)
Bicarbonate: 28 mmol/L (ref 22–29)
Bilirubin, Tot: 1.5 mg/dL — ABNORMAL HIGH (ref <1.2)
Calcium: 9.1 mg/dL (ref 8.6–10.2)
Chloride: 106 mmol/L (ref 98–107)
Creatinine: 0.75 mg/dL (ref 0.51–0.95)
GFR (African Amer.): >60 mL/min
GFR: >60 mL/min
Glucose: 142 mg/dL — ABNORMAL HIGH (ref ?–115)
Potassium: 4.6 mmol/L (ref 3.5–5.1)
Sodium: 140 mmol/L (ref 136–145)
Total Protein: 7.5 gm/dL (ref ?–8.0)

## 2008-03-17 LAB — LIVER PANEL, BLOOD: Bilirubin, Dir: 0.3 mg/dL — ABNORMAL HIGH (ref <0.2)

## 2008-03-17 LAB — CHOLESTEROL, TOTAL BLOOD: Cholesterol: 173 mg/dL (ref <200)

## 2008-03-17 LAB — ADIF: Plt Est: LOW

## 2008-03-17 LAB — PROTHROMBIN TIME, BLOOD
INR: 1.4
PT,Patient: 16 s — ABNORMAL HIGH (ref 9.7–12.5)

## 2008-03-17 LAB — GLYCOSYLATED HGB(A1C), BLOOD: Glyco Hgb (A1C): 8.3 % — ABNORMAL HIGH (ref ?–5.9)

## 2008-03-23 ENCOUNTER — Encounter (HOSPITAL_BASED_OUTPATIENT_CLINIC_OR_DEPARTMENT_OTHER): Payer: Self-pay | Admitting: Ophthalmology

## 2008-03-24 ENCOUNTER — Ambulatory Visit (INDEPENDENT_AMBULATORY_CARE_PROVIDER_SITE_OTHER): Admitting: Ophthalmology

## 2008-03-24 ENCOUNTER — Other Ambulatory Visit (HOSPITAL_BASED_OUTPATIENT_CLINIC_OR_DEPARTMENT_OTHER): Payer: Self-pay | Admitting: Adult Health

## 2008-03-29 ENCOUNTER — Encounter (HOSPITAL_BASED_OUTPATIENT_CLINIC_OR_DEPARTMENT_OTHER)

## 2008-04-03 ENCOUNTER — Ambulatory Visit (INDEPENDENT_AMBULATORY_CARE_PROVIDER_SITE_OTHER): Admitting: Ophthalmology

## 2008-04-04 NOTE — Op Note (Signed)
 Dictating Practitioner: Mahayla Haddaway P. Zaydin Billey, M.D.     Staff Physician: Malcome Ambrocio P. Aqeel Norgaard, M.D.    Date of Operation: 03/23/2008    PREOPERATIVE DIAGNOSIS: Cataract, left eye.  POSTOPERATIVE DIAGNOSIS: Cataract, left eye.  PROCEDURE: Extracapsular cataract extraction by phacoemulsification with  intraocular lens implant, left eye.  SURGEON/STAFF: Lory Rough    ANESTHESIA: Topical Marcaine 0.75% with monitored anesthesia care.  COMPLICATIONS: None.  INTRAOCULAR LENS DATA: Model [id number], power 26.0 diopters, serial number  [id number].    INDICATIONS: The patient is a 67 year old female with complaints of  decreased vision interfering with her daily activities. Her visual acuity  in the left eye is 20/200 due to combined nuclear sclerosis and cortical  cataract.    CONSENT: The risks, benefits and alternatives of cataract extraction with  lens implantation were reviewed with the patient. Opportunity for  questions was given and an informed consent was obtained. The patient has  undergone cataract extraction with lens implant in the right eye with good  results and wishes to proceed with surgery in the left eye.    DESCRIPTION OF PROCEDURE: The patient was taken to the Operating Room in  stable condition and placed in the supine position. Blood pressure and  pulse oximetry monitoring were established and maintained throughout the  entire case. The left eye was then prepped and draped in the usual sterile  ophthalmic fashion. The operating room microscope was used. A lid  speculum was placed between the left eyelids. The surgeon was seated at  the temporal aspect of the globe.    A 2.40 mm keratome was used to enter the anterior chamber at the temporal  corneal limbus followed by Xylocaine  1 % preservative free. Provisc was  then placed into the anterior chamber and a paracentesis port was then made  using the 75-blade. Next, a capsulorrhexis was made using a combination of  the cystitome and Utrata forceps. Hydrodissection was  then performed using  balanced salt solution placed on a cannula tip. Next, the  phacoemulsification hand-piece was introduced into the anterior chamber and  the nucleus was sculpted into two deep troughs. The nucleus was then  cracked into four quadrants and each of the nuclear fragments was then  irrigated, aspirated and phacoemulsified. The remaining cortical material  was then removed using the automated irrigation-aspiration apparatus.  Provisc was then placed into the intact capsular bag.    The lens implant was then removed from its sterile package, inspected, and  found suitable for implantation. It was then placed into the capsular bag  under direct visualization. Miochol was then placed into the anterior  chamber to constrict the pupil and the Provisc was then removed using the  irrigating-aspirating apparatus. Next, balanced salt solution was placed  into the anterior chamber to establish physiologic pressure and the corneal  stroma was then hydrated in order to create a watertight seal. Interrupted  10-0 nylon suture was placed as necessary. The wound was checked and found  to be watertight.    A collagen eye shield soaked in TobraDex was then placed onto the left  globe and the lid speculum was removed. The left eye was then patched with  a sterile eye patch followed by a hard shield. The patient was then taken  to the Recovery Room having tolerated the procedure well without  complication.    Postoperative instructions and follow-up appointment were given to the  patient.  Electronically signed by:  Chuckie Craven, M.D. 04/04/2008 12:06 P    DD: 03/23/2008 DT: 03/23/2008 11:54 A DocNo.: [id number]  TPN/gxv       Primary Care Physician:   Maia Schwartz M.D.   [address] [place], [place] [address]    cc:

## 2008-04-11 ENCOUNTER — Other Ambulatory Visit (HOSPITAL_BASED_OUTPATIENT_CLINIC_OR_DEPARTMENT_OTHER): Payer: Self-pay | Admitting: Adult Health

## 2008-04-11 MED ORDER — ALENDRONATE SODIUM 70 MG OR TABS
70.0000 mg | ORAL_TABLET | ORAL | Status: DC
Start: 2008-04-11 — End: 2008-10-31

## 2008-04-11 MED ORDER — FUROSEMIDE 40 MG OR TABS
40.0000 mg | ORAL_TABLET | Freq: Two times a day (BID) | ORAL | Status: DC
Start: 2008-04-11 — End: 2008-10-31

## 2008-04-11 MED ORDER — SPIRONOLACTONE 25 MG OR TABS
100.0000 mg | ORAL_TABLET | Freq: Every day | ORAL | Status: DC
Start: 2008-04-11 — End: 2008-10-31

## 2008-04-12 ENCOUNTER — Ambulatory Visit (HOSPITAL_BASED_OUTPATIENT_CLINIC_OR_DEPARTMENT_OTHER): Admitting: Internal Medicine

## 2008-04-12 ENCOUNTER — Ambulatory Visit (HOSPITAL_BASED_OUTPATIENT_CLINIC_OR_DEPARTMENT_OTHER): Payer: Self-pay | Admitting: Gastroenterology

## 2008-04-12 MED ORDER — GLIPIZIDE 10 MG OR TABS
10.0000 mg | ORAL_TABLET | Freq: Two times a day (BID) | ORAL | Status: DC
Start: 2008-04-12 — End: 2008-04-13

## 2008-04-12 NOTE — Progress Notes (Signed)
ENDOCRINE ATTENDING ADDENDUM:  Patient seen with fellow, and the fellow history reviewed.   Asked to consult by Hyman Bible, Paula Manning is a 67 year old female seen in consult for uncontrolled DM2.  She also has cirrhosis related to Hep C.  She is currently on glyburide 5 bid.  She denies lows.  She is only checking FSG generally once a day and they range 140-200's.    On exam, BP 127/57   Pulse 62   Temp(Src) 97.5 F (36.4 C) (Oral)   Ht 5\' 1"  (1.549 m)   Wt 149 lb (67.586 kg) Gen: Overwt, pleasant NAD. See fellow note for more details.    Assessment and plan reviewed with fellow.     DM2, uncontrolled.  We do not have enough data to make too many changes to her regimen at this point.  We instructed her to check FSG 3x/day.  Given her cirrhosis, will avoid metformin and TZDs.    Will switch glyburide to Glipizide 10 mg bid.  Consider adding Januvia if her log shows FSG are not at goal.  See the fellow note for more details.

## 2008-04-12 NOTE — Progress Notes (Signed)
 Reason for Consultation: DM2    Referring Provider: Leone Payor, MD    HPI: Paula Manning is a 67 year old female with DM2, HTN and HCV c/b cirrhosis who  presents in consultation for diabetes medical management. She was diagnosed with DM2 5 years ago during routine blood tests. She does not have any family history of diabetes. She takes Glyburide 5mg  BID. She checks her BG once daily in the morning. She does not have her log book or a meter with her today. She states that this morning her BG was 200mg /dL. Normally, it ranges from 140-200.  Paula Manning is present with her son, who is translating for her.  She feels well and does not have any complaints.    DIET:  Breakfast: Scrambled eggs with two pieces of toast.  Lunch: Chicken or beef with 3-5 tortillas.  Dinner:  Cereal or [employer] sandwich (1/2 sandwich).    PMHx:  Past Medical History   Diagnosis Date   . Migraine Headache    . Glaucoma    . Ascites 06/29/2007     History of paracentesis x 2   . Pancreatic Cyst 06/29/2007   . Cirrhosis 06/24/2007     Active on the liver transplant waiting list with a blood type of O positive. 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 Osteopenia 06/24/2007   . Emphysema 06/24/2007   . EV (Esophageal Varices) 06/10/2007   . Anemia 06/10/2007   . HCV (Hepatitis C Virus) 06/10/2007     Achieved SVR.   Marland Kitchen DM Circ Dis Type II 06/10/2007   . HTN 06/10/2007   . HTN 06/10/2007     PSHx:  Past Surgical History   Procedure Date   . Lap,cholecystectomy/explore 1970s     in [place];  open CCX   . Cesarean delivery only    . Revision of bladder/urethra      Bladder suspension     ALL: Advil    MEDS:     Current outpatient prescriptions   Medication Sig   . spironolactone (ALDACTONE) 25 MG tablet Take 4 Tabs by mouth daily.   . furosemide (LASIX) 40 MG tablet Take 1 Tab by mouth 2 times daily.   Marland Kitchen alendronate (FOSAMAX) 70 MG tablet Take 1 Tab by mouth every 7 days.   Marland Kitchen VITAMIN D 400 UNIT OR CAPS 1 CAPSULE DAILY   .  CALCIUM CARBONATE 600 MG OR TABS 1 tablet 2 times daily   . GLYBURIDE 5 MG OR TABS 1 TABLET TWICE A DAY   . RANITIDINE HCL 300 MG OR CAPS 1 CAPSULE TWICE A DAY   . PROPRANOLOL HCL 20 MG OR TABS 1 TABLET 3 TIMES DAILY     SHX: No tobacco.    ROS: No polyruia, polydypsia, blurry vision, shortness of breath or chest pain.  No numbness, tingling, pain or new lower extremity lesions.     PHYSICAL EXAM: BP 127/57  Pulse 62  Temp(Src) 97.5 F (36.4 C) (Oral)  Ht 5\' 1"  (1.549 m)  Wt 149 lb (67.586 kg)  GENENERAL: Well developed, well nourished. NAD.  HEAD: NC, AT  NECK: Supple  EYES: Anicteric  CARDIOVASCULAR: RRR No M/R/G  RESPIRATORY: CTA No C/W/R  SKIN: Warm and dry  PSYCHIATRIC: Normal affect    LABS:  Lab Results   Component Value Date/Time   . A1C 8.3 03/17/08  9:15 AM   . A1C 7.3 09/30/06 11:59 AM   . A1C  6.0 12/16/05 10:15 AM       Lab Results   Component Value Date/Time   . NA 140 03/17/08  9:15 AM   . K 4.6 03/17/08  9:15 AM   . CL 106 03/17/08  9:15 AM   . BICARB 28 03/17/08  9:15 AM   . BUN 13 03/17/08  9:15 AM   . CREAT 0.75 03/17/08  9:15 AM   . GLU 142 03/17/08  9:15 AM   . [place] 9.1 03/17/08  9:15 AM       Lab Results   Component Value Date/Time   . CHOL 173 03/17/08  9:15 AM   . HDL 37 04/04/04  9:29 AM   . LDLCALC 52 04/04/04  9:29 AM   . TRIG 74 04/04/04  9:29 AM       Lab Results   Component Value Date/Time   . AST 29 03/17/08  9:15 AM   . ALT 26 03/17/08  9:15 AM   . GGT 12 10/16/04 12:00 PM   . ALK 96 03/17/08  9:15 AM   . TP 7.5 03/17/08  9:15 AM   . ALB 3.7 03/17/08  9:15 AM   . TBILI 1.5 03/17/08  9:15 AM   . DBILI 0.3 03/17/08  9:15 AM       A/P:Paula Manning is a 67 year old female with DM2, HTN and HCV c/b cirrhosis who  presents in consultation for diabetes medical management. The patient states that she is terrified of injections and does not want to be considered for insulin therapy. She states that she does not use any dietary discretion in general. Given that she is not maximized on her SFU, we can titrate the dose before  considering insulin therapy. In addition, will change her glyburide to glipizide to avoid hypoglycemia. In the future, we can consider adding sitagliptin.    1. DM:  -Change glyburide to glipizide to avoid hypoglycemia.    -Increase glipizide  to 10 mg BID.    -Check BG once in the morning and again before dinner and bedtime.    -No more than 1 piece of toast at breakfast.    -No more than 1-2 tortillas at lunch.    -RTC in 2 months with a repeat A1C and urine microalbumin to creatinine ratio.    2. BP:  -Continue current medications.    3. Lipids: -Given her history of HCV c/b cirrhosis, would not treat her with statins.    -LDL is at goal.    4. Feet: -Continue self exam.    5. Eyes: -She was seen by Dr. Leisa Purchase on March 23, 2008.    6. Renal: -Check urine microalbumin to creatinine ratio with next blood work.       Case d/w Dr. Isaias March.

## 2008-04-12 NOTE — Patient Instructions (Addendum)
 1. Stop taking the glyburide.    2.  Increase your glipizide to 10 mg at breakfast and 10 mg at dinner.    3. Check your blood glucose once in the morning before breakfast and before dinner and bedtime.    4. Return to clinic in 2 months.

## 2008-04-13 ENCOUNTER — Other Ambulatory Visit (HOSPITAL_BASED_OUTPATIENT_CLINIC_OR_DEPARTMENT_OTHER): Payer: Self-pay

## 2008-04-13 ENCOUNTER — Telehealth (INDEPENDENT_AMBULATORY_CARE_PROVIDER_SITE_OTHER): Payer: Self-pay | Admitting: Female Pelvic Medicine and Reconstructive Surgery

## 2008-04-13 MED ORDER — HYDROCODONE-ACETAMINOPHEN 5-500 MG OR TABS
1.0000 | ORAL_TABLET | ORAL | Status: DC | PRN
Start: 2008-04-13 — End: 2008-05-18

## 2008-04-13 MED ORDER — GLIPIZIDE 10 MG OR TABS
10.0000 mg | ORAL_TABLET | Freq: Two times a day (BID) | ORAL | Status: DC
Start: 2008-04-13 — End: 2008-05-04

## 2008-04-13 NOTE — Telephone Encounter (Signed)
 Called into pharmacy

## 2008-04-13 NOTE — Telephone Encounter (Signed)
 Vicodin sent in. Please notify pt.

## 2008-04-14 ENCOUNTER — Encounter (HOSPITAL_BASED_OUTPATIENT_CLINIC_OR_DEPARTMENT_OTHER): Payer: Self-pay

## 2008-04-20 ENCOUNTER — Ambulatory Visit (HOSPITAL_BASED_OUTPATIENT_CLINIC_OR_DEPARTMENT_OTHER)

## 2008-04-20 LAB — PB ECHOCARDIOGRAM, 2-D
LV Ejection Fraction: 75 % (ref >50)
PA Pressure: 19 mm Hg + CVP (ref 20–30)

## 2008-04-22 LAB — PB ECHO 2-D, DOPPLER & COLOR FLOW
ATRIAL RATE: 60 {beats}/min
ECG INTERPRETATION: NORMAL
P AXIS: 59 degrees
PR INTERVAL: 174 ms
QRS INTERVAL/DURATION: 76 ms
QT: 470 ms
QTc (Bazett): 470 ms
R AXIS: 47 degrees
T AXIS: 46 degrees
VENTRICULAR RATE: 60 {beats}/min

## 2008-05-01 ENCOUNTER — Ambulatory Visit (HOSPITAL_BASED_OUTPATIENT_CLINIC_OR_DEPARTMENT_OTHER)

## 2008-05-01 ENCOUNTER — Other Ambulatory Visit: Payer: Self-pay | Admitting: Radiation Oncology

## 2008-05-04 ENCOUNTER — Other Ambulatory Visit (HOSPITAL_BASED_OUTPATIENT_CLINIC_OR_DEPARTMENT_OTHER): Payer: Self-pay

## 2008-05-05 ENCOUNTER — Ambulatory Visit (INDEPENDENT_AMBULATORY_CARE_PROVIDER_SITE_OTHER)

## 2008-05-06 MED ORDER — GLIPIZIDE 10 MG OR TABS
10.0000 mg | ORAL_TABLET | Freq: Two times a day (BID) | ORAL | Status: DC
Start: 2008-05-04 — End: 2008-06-08

## 2008-05-08 ENCOUNTER — Ambulatory Visit (INDEPENDENT_AMBULATORY_CARE_PROVIDER_SITE_OTHER): Admitting: Ophthalmology

## 2008-05-15 ENCOUNTER — Encounter (INDEPENDENT_AMBULATORY_CARE_PROVIDER_SITE_OTHER): Admitting: Ophthalmology

## 2008-05-18 ENCOUNTER — Other Ambulatory Visit (HOSPITAL_BASED_OUTPATIENT_CLINIC_OR_DEPARTMENT_OTHER): Payer: Self-pay | Admitting: Adult Health

## 2008-05-18 ENCOUNTER — Ambulatory Visit (HOSPITAL_BASED_OUTPATIENT_CLINIC_OR_DEPARTMENT_OTHER): Admitting: Gastroenterology

## 2008-05-18 VITALS — BP 122/60 | HR 66 | Temp 97.2°F | Resp 16 | Ht 66.0 in | Wt 150.0 lb

## 2008-05-18 LAB — COMPREHENSIVE METABOLIC PANEL, BLOOD
ALT (SGPT): 29 U/L
AST (SGOT): 32 U/L
Albumin: 3.8 g/dL (ref 3.5–5.2)
Alkaline Phos: 96 U/L
BUN: 15 mg/dL (ref 6–20)
Bilirubin, Tot: 1.6 mg/dL — ABNORMAL HIGH (ref <1.2)
Calcium: 9.4 mg/dL (ref 8.6–10.5)
Chloride: 102 mmol/L (ref 98–107)
Creatinine: 0.73 mg/dL (ref 0.51–0.95)
GFR (African Amer.): >60 mL/min
GFR: >60 mL/min
Potassium: 4.6 mmol/L (ref 3.5–5.1)
Sodium: 137 mmol/L (ref 136–145)
Total Protein: 7.6 g/dL (ref ?–8.0)

## 2008-05-18 MED ORDER — LANSOPRAZOLE 30 MG OR CPDR
30.00 mg | DELAYED_RELEASE_CAPSULE | Freq: Every day | ORAL | Status: DC
Start: ? — End: 2008-11-24

## 2008-05-18 MED ORDER — INFLUENZA VIRUS VACC SPLIT PF IM SUSP
0.50 mL | Freq: Once | INTRAMUSCULAR | Status: AC
Start: 2008-05-18 — End: 2008-05-21

## 2008-05-18 NOTE — Progress Notes (Signed)
 Flu vaccine ordered. Fluarix 0.5 ml given left deltoid, I.M. Tolerated well.

## 2008-05-18 NOTE — Progress Notes (Signed)
 Visit practitioner:  Doralee Albino. 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       HTN [401.9BX] 06/10/2007            History of present illness:    Paula Manning is a 67 year old female with end-stage liver disease secondary to Hepatitis C.  Last seen in our clinic 03/09/08.  Today, the patient has no complaints.  Has seen eye specialist and due to return for follow up treatment of glaucoma.  Does not no if one or both eyes are affected.  Saw endocrinologist in September and Hgb A1c ordered and urine microalbumin which needs to be done today.  Has only been testing blood sugars daily but instructed to be done 3 times a day in order to evaluate glipizide treatment.    Review of systems:       Confusion:  Denies       Jaundice: Denies       Shortness of breath:  Denies       Hematemesis: Denies       Increased abdominal girth: Denies       Melena:  Denies       Peripheral edema:  Denies    Current Medications:     lansoprazole (PREVACID) 30 MG capsule Take 30 mg by mouth daily.   glipiZIDE (GLUCOTROL) 10 MG tablet Take 1 Tab by mouth 2 times daily.   spironolactone (ALDACTONE)  25 MG tablet Take 4 Tabs by mouth daily.   furosemide (LASIX) 40 MG tablet Take 1 Tab by mouth 2 times daily.   alendronate (FOSAMAX) 70 MG tablet Take 1 Tab by mouth every 7 days.   VITAMIN D 400 UNIT OR CAPS 1 CAPSULE DAILY   CALCIUM CARBONATE 600 MG OR TABS 1 tablet 2 times daily   PROPRANOLOL HCL 20 MG OR TABS Take 1 Tab by mouth 2 times daily.   DISCONTD: hydrocodone-acetaminophen (VICODIN) 5-500 MG per tablet Take 1 Tab by mouth every 4 hours as needed for Moderate Pain.   DISCONTD: RANITIDINE HCL 300 MG OR CAPS Take 1 mg by mouth at bedtime.         Physical examination:    BP 122/60  Pulse 66  Temp(Src) 97.2 F (36.2 C) (Oral)  Resp 16  Ht 5\' 6"  (1.676 m)  Wt 150 lb (68.04 kg)  SpO2 96%  General:  Alert and oriented x 3  Affect:  Normal  Nutrition:  Weight is stable.  Hepatic encephalopathy:  Stage 0  HEENT:  No scleral icterus, no conjunctival pallor, mucus membranes moist, neck supple without lymphadenopathy, no thryroid abnormalities appreciated  Lungs:  Clear to auscultation bilaterally  Cardiovascular: Regular rate and rhythm without murmurs, rubs or gallops.  Abdomen:  Bowel sounds present x 4, liver palable, spleen nonpalpable, no ascites, no tenderness and no guarding  Extremities:  No peripheral edema  Skin:  Non-icteric, no palmar erythema, no spider angiomas, and no visible rash  Neuro:  No asterixis    Laboratory studies:  Check today    Diagnostic studies: EGD 12/28/07  ENDOSCOPIC DIAGNOSIS  1) No esophageal varices.  2) No gastric varices.  3) Mild portal hypertenisve gastropathy.  4) Duodenal bulb ulcerations, clean based.    CT of abdomen 05/09  IMPRESSION: Multiphase CT scan with and without IV contrast.    1. Nodule assessment LIRADS 1; no arterially enhancing nodules in the liver.    2. Interval appearance of 1.2 cm right lung base pulmonary nodule. Recommenddedicated chest CT for comprehensive evaluation of the chest and further malignancy workup as indicated.    3. Cirrhosis with  splenomegaly, ascites, portal colopathy, and paraesophageal varices, compatible with end-stage liver disease.    4. 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    5. Redemonstration of multiple cystic lesions of the pancreas.    6. Other ancillary findings unchanged since 05/26/07.  Chest CT 05/09  IMPRESSION:    1. Previously questioned 1.2 cm "lung" nodule is again noted adjacent to the right hemidiaphragm in the right lower lobe. I feel this finding represents a nodular liver surface, but I am willing to do a chest MRI to prove it. Follow-up chest MRI as clinically indicated.    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  Doing well.    1. HCC screening:  CT of abdomen neg for [facility] 05/09, will do follow up in November  2. Esophageal varices screening:  EGD 01/28/08 neg for varices, follow up in one year  3. DM 2 Uncontrolled: follow up with endocrinologist per recs.  Continue glipizede 10 mg daily, check blood sugars 3 times daily and log in book given to patient today.  4. Health maintenance:  Flu vaccine  5. Return to clinic in 12 weeks.

## 2008-05-18 NOTE — Patient Instructions (Signed)
 Go to lab today for blood work  Get flu vaccine today  Follow up in 3 months

## 2008-05-22 ENCOUNTER — Ambulatory Visit (INDEPENDENT_AMBULATORY_CARE_PROVIDER_SITE_OTHER): Admitting: Ophthalmology

## 2008-05-22 NOTE — Progress Notes (Signed)
 Patient is a 67 year old female with esld secondary to HCV who is listed for transplant.  Met with patient and her son, Paula Manning, in clinic 05/18/08.  Patient was alert and oriented.      Patient continues to live in [place] with her husband in [address] housing.  Patient receives social security, and her husband recycles bottles and cans.  Patient continues to have Medi-care B and Medi-cal.  Patient reported taking medications as prescribed except for Lasix.  Patient reported not taking the Lasix when going out due to urinary incontinence.  Social Worker encouraged patient to take all medications as prescribed and will discuss the need for incontinent supplies with her PCP.  Patient remains independent with all ADLs.  However, feels she may benefit from a grab bar in her bathroom and will discuss with her landlord/Sec 8 worker.  She continues to identify her husband and children as post transplant caregivers.  Social Worker educated Programmer, systems about post transplant caregiver responsibilities and PFL.  He confirmed between himself and other caregivers, patient will have needed care.      Will follow-up as needed and remain available.

## 2008-06-07 ENCOUNTER — Encounter (HOSPITAL_BASED_OUTPATIENT_CLINIC_OR_DEPARTMENT_OTHER): Admitting: Internal Medicine

## 2008-06-08 ENCOUNTER — Other Ambulatory Visit (HOSPITAL_BASED_OUTPATIENT_CLINIC_OR_DEPARTMENT_OTHER): Payer: Self-pay

## 2008-06-12 MED ORDER — GLIPIZIDE 10 MG OR TABS
10.0000 mg | ORAL_TABLET | Freq: Two times a day (BID) | ORAL | Status: DC
Start: 2008-06-08 — End: 2008-07-14

## 2008-06-23 ENCOUNTER — Ambulatory Visit (INDEPENDENT_AMBULATORY_CARE_PROVIDER_SITE_OTHER)

## 2008-06-23 ENCOUNTER — Ambulatory Visit (INDEPENDENT_AMBULATORY_CARE_PROVIDER_SITE_OTHER): Admitting: Ophthalmology

## 2008-07-14 ENCOUNTER — Other Ambulatory Visit (HOSPITAL_BASED_OUTPATIENT_CLINIC_OR_DEPARTMENT_OTHER): Payer: Self-pay

## 2008-07-14 NOTE — Telephone Encounter (Signed)
Pt was last seen in clinic as a new pt on 04/12/08 by Dr Sidney Ace. Pt NOS on f/up visit on 06/07/08. I attempted to contact pt but was not available. Message left to call me back to set up an appt and to have labs drawn already ordered in Epic.

## 2008-07-15 MED ORDER — GLIPIZIDE 10 MG OR TABS
10.0000 mg | ORAL_TABLET | Freq: Two times a day (BID) | ORAL | Status: DC
Start: 2008-07-14 — End: 2008-08-16

## 2008-08-06 NOTE — Telephone Encounter (Signed)
This encounter is being closed administratively for the purposes of statistical clarity and integrity of the record.  The act of closure provides no guarantee of clinical oversight of contents, which are solely in the purview of the encounter provider(s).  The closing physician is acting on the authority of the Medical Records and Informatics Oversight Committee of the [facility] in sealing this encounter to prevent inadvertent late entry of clinical information.

## 2008-08-15 ENCOUNTER — Telehealth (HOSPITAL_BASED_OUTPATIENT_CLINIC_OR_DEPARTMENT_OTHER): Payer: Self-pay | Admitting: Adult Health

## 2008-08-15 NOTE — Telephone Encounter (Signed)
 Patient coming in for routine labs

## 2008-08-16 ENCOUNTER — Other Ambulatory Visit (HOSPITAL_BASED_OUTPATIENT_CLINIC_OR_DEPARTMENT_OTHER): Payer: Self-pay | Admitting: Internal Medicine

## 2008-08-17 MED ORDER — GLIPIZIDE 10 MG OR TABS
10.0000 mg | ORAL_TABLET | Freq: Two times a day (BID) | ORAL | Status: DC
Start: 2008-08-16 — End: 2008-10-20

## 2008-08-17 NOTE — Telephone Encounter (Signed)
 Please notify patient that will give one more refill, but needs to schedule follow up visit for further refills or should have PCP order for her.

## 2008-08-18 ENCOUNTER — Other Ambulatory Visit (HOSPITAL_BASED_OUTPATIENT_CLINIC_OR_DEPARTMENT_OTHER): Payer: Self-pay | Admitting: Adult Health

## 2008-08-18 ENCOUNTER — Other Ambulatory Visit (HOSPITAL_BASED_OUTPATIENT_CLINIC_OR_DEPARTMENT_OTHER): Payer: Self-pay | Admitting: Endocrinology

## 2008-08-18 NOTE — Telephone Encounter (Addendum)
 Notified pt of refill and need for f/u visit needed or to have PCP order medication.

## 2008-08-24 ENCOUNTER — Ambulatory Visit (HOSPITAL_BASED_OUTPATIENT_CLINIC_OR_DEPARTMENT_OTHER): Admitting: Gastroenterology

## 2008-08-24 NOTE — Progress Notes (Signed)
 Visit practitioner:  Doralee Albino. 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 Complains of dizziness for past 3 months and experiences this while in bed.  Denies n/v/d.  No bleeding or swelling.  Checking blood sugars 2 times daily.  This morning was 245 and in the evening it is in the 300 range.      Review of systems:as above; all other systems negative         Current Medications:     glipiZIDE (GLUCOTROL) 10 MG tablet Take 1 Tab by mouth 2 times daily.   lansoprazole (PREVACID) 30 MG capsule Take 30 mg by mouth daily.   spironolactone (ALDACTONE) 25 MG tablet Take 4 Tabs by mouth daily.   furosemide (LASIX) 40 MG tablet Take 1 Tab by mouth 2 times daily.   alendronate (FOSAMAX) 70 MG tablet Take 1 Tab by mouth every 7 days.   VITAMIN D 400 UNIT OR CAPS 1 CAPSULE DAILY   CALCIUM CARBONATE 600 MG OR TABS 1 tablet 2 times daily   PROPRANOLOL HCL 20 MG OR TABS Take 1 Tab by mouth 2 times daily.          Physical examination:    BP 118/61  Pulse 70  Temp(Src) 97.5 F (36.4 C) (Oral)  Resp 16  Ht 5\' 6"  (1.676 m)  Wt 149 lb (67.586 kg)  SpO2 97%  General:  Alert and oriented x 3  Affect:  Normal  Nutrition:  Weight is stable.  Hepatic encephalopathy:  Stage 0  HEENT:  No scleral icterus, no conjunctival pallor, mucus membranes moist, neck supple without lymphadenopathy  Lungs:  Clear to auscultation bilaterally  Cardiovascular:  No carotid bruit, regular rate and rhythm without murmurs, rubs or gallops.  Abdomen:  Obese, bowel sounds present x 4, liver nonpalable, spleen nonpalpable, no ascites, no tenderness and no guarding  Extremities:  No peripheral edema  Skin:  Non-icteric, no palmar erythema, facial spider angiomas  Neuro:  No asterixis    Laboratory studies:   HEP LIVER TRANSPLANT Latest Reference Range 08/18/2008   WBC 4.0 - 11.0 1000/mm3 3.2 (L)   SEGS 45 - 70 %    BANDS 0 -  15 %    HGB 12.0 - 16.0 gm/dL 16.1   HCT 09.6 - 04.5 % 38.0   PLT 130 - 400 1000/mm3 53 (L)   INR  1.3   NA 136 - 145 mmol/L 138   K 3.5 - 5.1 mmol/L 4.1   CL 98 - 107 mmol/L 104   BI 22 - 29 mmol/L 28   BUN 6 - 20 mg/dL 12   CR 4.09 - 8.11 mg/dL 9.14   GLUC Low: 70 - 782 mg/dL 956 (H)   AST Low: F: 0 - 32 U/L 28   ALT Low: F: 0 - 33 U/L 31   TBILI Low: <1.2 mg/dL 1.3 (H)   DBILI Low: <2.1 mg/dL 0.3 (H)   ALK PHOS Low: F: 35-140 U/L 103   GGT Low: <30 IU/L    ALB 3.5 - 5.2 gm/dL 3.9   AFP Low: <8 ng/mL    MAG 1.8 - 2.5 mg/dL    PHOS 2.5 - 4.5 mg/dL    CAL 8.6 - 30.8 mg/dL 8.9   UR Low: M:5.7-8.4 mg/dL    TSH 6.96 - 2.95 uIU/mL    T4 4.5 - 10.9 mcg/dL    CHOLESTEROL Low: < 284 mg/dL    TRIGLYCERIDES 10 - 132 mg/dL    HEP C RNA QUANT IU/mL    HEP B AG       Diagnostic studies:      Assessment and plan:   Paula Manning is a 68 year old female with 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  Listed on liver transplant waiting list with a MELD of 10.      HCC screening:  Schedule MRI now and check AFP every  6 months  Esophageal varices screening:  Schedule upper and lower endoscopy together, will order Rx for colonoscopy  DM2: uncontrolled on glyipizide 10 mg, HgbA1c 8.2, has follow up with endocrine on 09/06/08  Ascites: none appreciated, continue spironolactone and lasix  Health maintenance:  H1N1 flu vaccine today  Return to clinic in 3 months

## 2008-08-24 NOTE — Progress Notes (Signed)
 Patient is a 68 year old Spanish speaking female with esld.  Met with her and her son, [name], in clinic.  Patient was alert and oriented.  She continues to live with her husband in [place].  She reported she feels well other than dizziness.  She reported taking all medications and continues to have some urinary incontinence she attributes to Lasix.  Patient reported checking her bs two times a day but has not been keeping a log.  Patient has an appointment with endocrine later this month and will start keeping a bs log to bring to her appointment.  Patient reported she had an appointment with her [employer] worker yesterday who will assist with getting a grab bar in her bathroom.  Patient remains independent with personal care and drives short distances in [facility].  Her son asked about assistance in her the home if patient ever needed it.  Educated him on IHSS.  [name] remains available to assist with post transplant care.    Will follow-up as needed and remain available.

## 2008-08-24 NOTE — Patient Instructions (Signed)
 Please check blood sugars and record 2 times daily in the book given to you  MRI and EGD, Colonoscopy due and will be scheduled by [facility]  Get the prescription filled for colonoscopy and take as directed  Follow up in 3 months

## 2008-08-24 NOTE — Progress Notes (Signed)
 Flu vaccine ordered. H1N1 0.5 mol given left deltoid,I.M. Tolerated well.

## 2008-08-24 NOTE — Progress Notes (Signed)
 Attending Note:    Subjective:  I reviewed the history.  Patient interviewed and examined.  History of present illness (HPI):       Review of Systems (ROS): As per the nurse practitioner's note.  Past Medical, Family, Social History:  As per the nurse practitioner's  note.    Objective:   I have examined the patient and I concur with the nurse practitioner's exam.  Assessment and plan reviewed with the nurse practitioner.  I agree with the nurse practitioner's plan as documented.    See the nurse practitioner's note for further details.    IMPRESSIONS: This is a pleasant 68 year old woman with HCV GT2 cirrhosis (achieved SVR) fairly well compensated who is active on the liver transplant list. 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 She is otherwise doing well. The likely IPMT is not a barrier to her transplant candidacy and we will continue to monitor it with serial imaging studies. Her MELD score is low and her diabetes is poorly controlled.  1) Varices screening. Repeat EGD now.   2) HCC screening. Repeat triphasic abd CT.   3) Fluid retention. Well controlled with low dose diuretics.   4) Diabetes. On oral hypoglycemic. Needs to go back to endocrinology for management.   5) Return in 4 months.

## 2008-08-29 ENCOUNTER — Encounter (HOSPITAL_BASED_OUTPATIENT_CLINIC_OR_DEPARTMENT_OTHER)

## 2008-09-06 ENCOUNTER — Ambulatory Visit (HOSPITAL_BASED_OUTPATIENT_CLINIC_OR_DEPARTMENT_OTHER): Admitting: "Endocrinology

## 2008-09-06 MED ORDER — INSULIN GLARGINE 100 UNIT/ML SC SOLN
10.0000 [IU] | Freq: Every evening | SUBCUTANEOUS | Status: DC
Start: 2008-09-06 — End: 2008-12-05

## 2008-09-06 NOTE — Progress Notes (Signed)
 CC: DM2 f/u.   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 She was diagnosed with DM2 5 years ago during routine blood tests. She does not have any family history of diabetes. She took Glyburide 5mg  BID. Last seen in Sept 09 as a new consult. Glyburide is switched to glipizide and was told to control her diet for better glucose control.  Today she states that she didn't change any of her diet because she is used to what she eats. Reports that she checks BG twice daily at home but didn't bring in any data. By her memory, her morning BG ~ 163-220, pm BG (usually after lunch) ~ 300s . Denies any hypo episodes or low BG readings. Otherwise she feels well today.   ROS:  no CP or palpitations, no SOB, no numbness/tingling, no polyuria, polydipsia, no N/V. No visual problems.     Past Medical History   Diagnosis Date   . Migraine Headache    . Glaucoma    . Ascites 06/29/2007     History of paracentesis x 2   . Pancreatic Cyst 06/29/2007   . Cirrhosis 06/24/2007     Active on the liver transplant waiting list with a blood type of O positive. 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 Osteopenia 06/24/2007   . Emphysema 06/24/2007   . EV (Esophageal Varices) 06/10/2007   . Anemia 06/10/2007   . HCV (Hepatitis C Virus) 06/10/2007     Achieved SVR.   Marland Kitchen DM Circ Dis Type II 06/10/2007   . HTN 06/10/2007   . HTN 06/10/2007     Allergies: Advil     Current outpatient prescriptions   Medication Sig   . glipiZIDE (GLUCOTROL) 10 MG tablet Take 1 Tab by mouth 2 times daily.   . lansoprazole (PREVACID) 30 MG capsule Take 30 mg by mouth daily.   Marland Kitchen spironolactone (ALDACTONE) 25 MG tablet Take 4 Tabs by mouth daily.   . furosemide (LASIX) 40 MG tablet Take 1 Tab by mouth 2 times daily.   Marland Kitchen alendronate (FOSAMAX) 70 MG tablet Take 1 Tab by mouth every 7 days.   Marland Kitchen VITAMIN D 400 UNIT OR CAPS 1 CAPSULE DAILY   . CALCIUM CARBONATE 600  MG OR TABS 1 tablet 2 times daily   . PROPRANOLOL HCL 20 MG OR TABS Take 1 Tab by mouth 2 times daily.     PHYSICAL EXAM:  Blood pressure 110/67, pulse 71, temperature 98.4 F (36.9 C), temperature source Oral, resp. rate 20, weight 148 lb (67.132 kg).  Gen: well developed, well nourished, not in distress.  Eyes: PERRL, EOMI  Neck: supple, no JVD, no bruits, no lymphadenopathy  CV: S1, S2 RRR  Pulm: clear  XBM:WUXL, NT/ND, BS+, no mass  Ext: No edema, no erythema, no ulceration. Pulsation ++ b/l.   Neuro: AAOx3,     LABS/STUDIES:  Lab Results   Component Value Date/Time   . A1C 8.2 08/18/08  9:05 AM       Lab Results   Component Value Date/Time   . CHOL 173 03/17/08  9:15 AM   . HDL 37 04/04/04  9:29 AM   . LDLCALC 52 04/04/04  9:29 AM   . TRIG 74 04/04/04  9:29 AM       Lab Results   Component Value Date/Time   .  AST 28 08/18/08  9:05 AM   . ALT 31 08/18/08  9:05 AM   . GGT 12 10/16/04 12:00 PM   . ALK 103 08/18/08  9:05 AM   . TP 7.6 08/18/08  9:05 AM   . ALB 3.9 08/18/08  9:05 AM   . TBILI 1.3 08/18/08  9:05 AM   . DBILI 0.3 08/18/08  9:05 AM       Lab Results   Component Value Date/Time   . NA 138 08/18/08  9:05 AM   . K 4.1 08/18/08  9:05 AM   . CL 104 08/18/08  9:05 AM   . BICARB 28 08/18/08  9:05 AM   . BUN 12 08/18/08  9:05 AM   . CREAT 0.65 08/18/08  9:05 AM   . GLU 252 08/18/08  9:05 AM   . [place] 8.9 08/18/08  9:05 AM     ASSESSMENT/PLAN:  This is a 68 year old female with DM2, HTN and HCV c/b cirrhosis who presents here for diabetes f/u.   1, Glucose control: poorly controlled due to uncontrolled diet.   - Instruct patient to control portion size and restrict carbohydrates. Send dietician for education.    -Cont with glipizide to 10 mg BID.   -Pt agrees to start insulin. Will start lantus 10 units qhs.   - Instruct patient to continue checking BG at home and bring in the meter and log book to the clinic on her next visit.    2. Blood pressure: at goal.   3. Lipids: LDL at goal in 2005.   - will repeat LPF.    4, Renal function: The patient has +  urine microalbumin. no renal insufficiency.  - add lisinopril 2.5 mg qday.   Since pt is on spironolactone and lasix also, will ask her to repeat K in one week.   5. Cardiovascular prevention: c/w aspirin.  6. Eye health: Last opthalmology visit 2 months ago for glaucoma.    7. Foot health: cont self-exam.   8, check HbA1c, BMP, LPF in 3 months     RTC in 3 months    Pt seen and discussed with attending Dr. Bluford Kaufmann, who agrees with the above.

## 2008-09-06 NOTE — Patient Instructions (Addendum)
 1, will send to Diebetic educator, She will call her for appointment.   2, needs to control her diet better, this is the key for her diabetes. Less carbohydrates.   3, Continue with glipizide 10 mg twice a day.   4, come back to the clinic to learn insulin injection.  5, Start lantus 10 units subcutaneously every night.    6, Check glucose at home at least twice a day and bring in meter and log book on next visit.   7, Check fasting labs before next visit (A1c, metabolic profile, lipid profile).   8, RTC in 3 months.   9, Start lisinopril 2.5 mg once a day for kidney protection. The medication might increase potassium level in your blood. So make sure you will check lab (basic metabolic profile) in one week after you started lisinopril to make sure potassium level is okay.    10. Call me with questions: [phone].

## 2008-09-06 NOTE — Progress Notes (Signed)
 ENDOCRINE ATTENDING ADDENDUM:    Dr. Deniece Portela history was reviewed.   Labs/Studies were reviewed.   Case was discussed with Dr. Si Gaul and I agree with the assessment and plan as detailed in her note.    Briefly, Paula Manning is a 68 year old female with diabetes mellitus type 2 and ESLD on transplant list.  On glipizide.  Didn't bring blood sugars for review.  Lab Results   Component Value Date/Time   . A1C 8.2 08/18/08  9:05 AM       Plan is to refer to dietician and [facility] for education  Start on basal insulin lantus 10 units daily - will schedule for appointment for insulin teaching.    Please see Dr. Deniece Portela note for further details.

## 2008-09-07 MED ORDER — LISINOPRIL 2.5 MG OR TABS
2.5000 mg | ORAL_TABLET | Freq: Every day | ORAL | Status: DC
Start: 2008-09-07 — End: 2008-12-05

## 2008-09-08 ENCOUNTER — Ambulatory Visit (HOSPITAL_BASED_OUTPATIENT_CLINIC_OR_DEPARTMENT_OTHER)

## 2008-09-08 ENCOUNTER — Ambulatory Visit (HOSPITAL_BASED_OUTPATIENT_CLINIC_OR_DEPARTMENT_OTHER): Payer: Self-pay | Admitting: "Endocrinology

## 2008-09-08 NOTE — Progress Notes (Signed)
 Pt is here today with husband and son for insulin injection instructions. Printed instructions in Spanish provided for pt and family. Teachings done verbally with demonstration of procedure. Pt and family successfully demonstrated verbal and practical knowledge of insulin injection, encouraged to call for any questions and concerns.

## 2008-09-18 ENCOUNTER — Ambulatory Visit (INDEPENDENT_AMBULATORY_CARE_PROVIDER_SITE_OTHER): Admitting: Ophthalmology

## 2008-09-20 ENCOUNTER — Other Ambulatory Visit: Payer: Self-pay

## 2008-09-29 ENCOUNTER — Ambulatory Visit (INDEPENDENT_AMBULATORY_CARE_PROVIDER_SITE_OTHER)

## 2008-10-19 ENCOUNTER — Telehealth (HOSPITAL_BASED_OUTPATIENT_CLINIC_OR_DEPARTMENT_OTHER): Payer: Self-pay

## 2008-10-19 NOTE — Telephone Encounter (Signed)
 Pharmacy is calling and needs refills for Glipizide 10mg  and they have faxed to [phone] for refill. Please send prescription asap.

## 2008-10-20 MED ORDER — GLIPIZIDE 10 MG OR TABS
10.0000 mg | ORAL_TABLET | Freq: Two times a day (BID) | ORAL | Status: DC
Start: 2008-10-20 — End: 2009-12-07

## 2008-10-20 NOTE — Telephone Encounter (Signed)
Message routed to Dr. Hu

## 2008-10-31 ENCOUNTER — Other Ambulatory Visit (HOSPITAL_BASED_OUTPATIENT_CLINIC_OR_DEPARTMENT_OTHER): Payer: Self-pay | Admitting: Adult Health

## 2008-10-31 MED ORDER — ALENDRONATE SODIUM 70 MG OR TABS
70.0000 mg | ORAL_TABLET | ORAL | Status: DC
Start: 2008-10-31 — End: 2010-12-27

## 2008-10-31 MED ORDER — SPIRONOLACTONE 25 MG OR TABS
100.0000 mg | ORAL_TABLET | Freq: Every day | ORAL | Status: DC
Start: 2008-10-31 — End: 2009-02-22

## 2008-10-31 MED ORDER — FUROSEMIDE 40 MG OR TABS
40.0000 mg | ORAL_TABLET | Freq: Two times a day (BID) | ORAL | Status: DC
Start: 2008-10-31 — End: 2009-02-22

## 2008-11-01 ENCOUNTER — Other Ambulatory Visit (INDEPENDENT_AMBULATORY_CARE_PROVIDER_SITE_OTHER): Payer: Self-pay | Admitting: Gastroenterology

## 2008-11-01 ENCOUNTER — Ambulatory Visit (HOSPITAL_BASED_OUTPATIENT_CLINIC_OR_DEPARTMENT_OTHER): Payer: Self-pay | Admitting: Internal Medicine

## 2008-11-13 ENCOUNTER — Encounter (INDEPENDENT_AMBULATORY_CARE_PROVIDER_SITE_OTHER): Payer: Self-pay | Admitting: Ophthalmology

## 2008-11-20 ENCOUNTER — Ambulatory Visit (INDEPENDENT_AMBULATORY_CARE_PROVIDER_SITE_OTHER): Admitting: Ophthalmology

## 2008-11-23 ENCOUNTER — Encounter (HOSPITAL_BASED_OUTPATIENT_CLINIC_OR_DEPARTMENT_OTHER): Admitting: Gastroenterology

## 2008-11-24 ENCOUNTER — Ambulatory Visit (HOSPITAL_BASED_OUTPATIENT_CLINIC_OR_DEPARTMENT_OTHER): Admitting: Adult Health

## 2008-11-24 NOTE — Progress Notes (Signed)
 NP NOTE:    SUBJECTIVE:   I reviewed the history.  Patient interviewed and examined.    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 Her MELD score is low.      Review of Systems (ROS): As per the NP student note.  Past Medical, Family, Social History: updated and reviewed today and as per student note.    MEDICAL PLAN of CARE:  1) EV screening. Repeat EGD no varices  2) HCC screening.MRI 09/20/08 no HCC; follow up in August  3) Fluid retention. Well controlled with low dose diuretics.   4) Diabetes. On oral hypoglycemic. Referral to endocrinology  5) Return in 3 months.    Assessment and plan reviewed with the NP student.  I agree with the NP student's plan as documented.    See the NP student's note for further details.

## 2008-11-24 NOTE — Patient Instructions (Signed)
 Follow up with Dr. Lissa Merlin in 3 months  Follow up with endocrine clinic, referral sent

## 2008-11-24 NOTE — Progress Notes (Signed)
 CLINIC: [facility]  ATTENDING: Phillip Heal, M.D., M.H.Sc.,    Sherrie Mustache  [address]  [place], [place] [address]     DATE OF SERVICE: 11/24/2008    [facility]    REASON FOR VISIT: Pre-liver follow up.    Patient Active Problem List   Diagnoses Date Noted   . Osteoporosis [733.00C] 07/26/2007   . Ascites [789.59K] 06/29/2007     History of paracentesis x 2     . 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 Cirrhosis [571.5N] 06/24/2007     Active on the liver transplant waiting list with a blood type of O positive.  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  Due to Hep C     . Emphysema [492.8AL] 06/24/2007   . Anemia [285.9AA] 06/10/2007   . EV (Esophageal Varices) [456.1L] 06/10/2007   . Seizure Disorder [345.90DB] 06/10/2007   . 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 HCV (Hepatitis C Virus) [070.70S] 06/10/2007     H/o GT2 but has achieved SVR.       . HTN [401.9BX] 06/10/2007         INTERVAL HISTORY:  This 68 y.o Spanish Speaking  female presents to clinic with her son (who is helping with the translation). Patient is doing well when she is following a low sodium and low carb diets. At least twice a week, she broke her diet's restrictions and would have some enchiladas.    REVIEW OF SYSTEMS:  Denies fever, nausea,+ cold intolerance, no nose bleed, blood in stool, no edema.  Confusion: No  Abdominal distention: No  Melena: No  Hematemesis: No  Hematochezia: No  Skin: + bruises to extremities, no skin rash, no edema.  All other systems negative.    Current outpatient prescriptions   Medication Sig Dispense Refill   . alendronate (FOSAMAX) 70 MG tablet Take 1 Tab by mouth every 7 days.  4 Tab  11   . furosemide (LASIX) 40 MG tablet Take 1 Tab by mouth 2 times daily.  60 Tab  11   . spironolactone (ALDACTONE) 25 MG tablet Take 4 Tabs by mouth daily.  120 Tab  11   . glipiZIDE  (GLUCOTROL) 10 MG tablet Take 1 Tab by mouth 2 times daily.  60 Tab  3   . lisinopril (PRINIVIL, ZESTRIL) 2.5 MG tablet Take 1 Tab by mouth daily.  30  2   . insulin glargine (LANTUS) 100 UNIT/ML injection Inject 10 Units under the skin nightly.  1 vial  2   . VITAMIN D 400 UNIT OR CAPS 1 CAPSULE DAILY  30  11   . CALCIUM CARBONATE 600 MG OR TABS 1 tablet 2 times daily  one month  11   . PROPRANOLOL HCL 20 MG OR TABS Take 1 Tab by mouth 2 times daily.  0  0   . DISCONTD: lansoprazole (PREVACID) 30 MG capsule Take 30 mg by mouth daily.             ALLERGIES: Allergies   Allergen Reactions   . Advil (Ibuprofen Micronized) Other     Intolerance                 Reports:     PMH reviewed and unchanged  Past surgical history reviewed and unchanged  Family history reviewed and unchanged  PHYSICAL EXAM:  BP 114/60  Pulse 67  Temp(Src) 97.4 F (36.3 C) (Oral)  Resp 20  Ht 5' 5.5" (1.664 m)  Wt 150 lb (68.04 kg)  General:  Alert and oriented x 3, conversant, pleasant and appropriate.  ENT: Oropharynx is clear, mucus membranes are moist.  Pulmonary:  Clear to auscultation bilaterally. Symmetrical chest expansion.  Cardiovascular: Regular rate and rhythm S 1, S 2. No murmurs, rubs, gallops.  GI: Abdomen soft, distended, no tender.  Extremities: No lower extremity edema.  CNS: No asterixis    LABS:  Lab Results   Component Value Date   . AST 28 08/18/2008   . ALT 31 08/18/2008   . GGT 12 10/16/2004   . ALK 103 08/18/2008   . TP 7.6 08/18/2008   . ALB 3.9 08/18/2008   . TBILI 1.3 08/18/2008   . DBILI 0.3 08/18/2008        Lab Results   Component Value Date   . WBC 3.2 08/18/2008   . RBC 3.89 08/18/2008   . HGB 13.6 08/18/2008   . HCT 38.0 08/18/2008   . MCV 97.7 08/18/2008   . MCHC 35.9 08/18/2008   . RDW 13.2 08/18/2008   . PLT 53 08/18/2008   . MPV 8.2 08/18/2008        Lab Results   Component Value Date   . NA 138 08/18/2008   . K 4.1 08/18/2008   . CL 104 08/18/2008   . BICARB 28 08/18/2008   . BUN 12 08/18/2008   . CREAT 0.65 08/18/2008   . GLU 252 08/18/2008   .  [place] 8.9 08/18/2008        Lab Results   Component Value Date   . BUN 12 08/18/2008   . CREAT 0.65 08/18/2008   . CL 104 08/18/2008   . NA 138 08/18/2008   . K 4.1 08/18/2008   . [place] 8.9 08/18/2008   . TBILI 1.3 08/18/2008   . ALB 3.9 08/18/2008   . TP 7.6 08/18/2008   . AST 28 08/18/2008   . ALK 103 08/18/2008   . BICARB 28 08/18/2008   . ALT 31 08/18/2008   . GLU 252 08/18/2008          Viral serologies/labs:  No results found for this basename: hepatitiscab        Lab Results   Component Value Date   . HEPBSURFACAB Negative 02/16/2002       Lab Results   Component Value Date   . HEPCRNA <28 01/25/2008       No results found for this basename: HEPBDNAQT        Studies:   1. 09/20/2008 MRI abdomen with and without   IMPRESSION:     1. Nodule assessment - in LIRADS Summary Study:   Multiple small late-enhancing nodules without arterial phase enhancement.    These represent LIRADS 3 lesions.     2. Other significant findings:   Cirrhosis.   Nonocclusive thrombus within the portal vein, extending to the confluence and    into the superior mesenteric vein, not appreciably changed from CT of    12/21/2007.   Splenomegaly and paraesophageal varices.   Multiple cysts within the uncinate process, head, body, and tail of the    pancreas, as well as pancreatic ductal dilatation and some irregularity within   the pancreatic duct contour. There are no solid enhancing components and    there has been no change in  theses lesions. These likely represent a    multifocal, predominantly side branch, benign intraductal papillary mucinous    neoplasm (IPMN). Attention to this finding can be performed on follow-up    examinations.     3. Recommend six month follow-up imaging on the 3T magnet under the    supervision of Dr. Harvest Forest.      4. Correction regarding contrast based allergies: Contrary to prior radiology    reports, the patient does not have a contrast allergy to gadolinium based    contrast agents. Dr. Manuela Neptune spoke at length, in Spanish, with the  patient,    her husband, and her son, who all clearly denied a reaction to gadolinium    based agent in the past. Instead, she explained that she gets nervous in the    scanner, and her throat becomes dry. During this examination, the patient was   monitored with a pulse oximeter throughout the entirety of the procedure and    tolerated the contrast agent well. She had no respiratory complaints after    the administration of the contrast agent and tolerated the entire procedure    without premedication.     5. Technical recommendation: With coaching, the patient is a good breath    holder, but does continue to have physiologic motion, perhaps due to bowel    peristalsis or cardiac motion. Recommend 3-D acquisition with high temporal    resolution and low spatial resolution, as well as parallel imaging, when this    patient is scanned in the future.     6. Excretion of Eovist is slow; suggest the patient have 30 minute delayed    imaging on subsequent scans.    2. 11/01/2008. EGD   Endoscopic Diagnosis: 1) No esophageal or gastric varices   visualized.   2) Portal hypertensive gastropathy with mild oozing at the   gastroesophageal junction secondary to scope trauma.   3) White plaques in the duodenum that were biopsied.     Recommendations: 1) Continue propranolol twice daily   2) Follow up in hepatology   3) Follow up on pathology from biopsy    3. 04/20/2008. Cardiac 2 D echo   Conclusions:   1) Normal left ventricular size and systolic function.   2) Mildly enlarged left atrium.   3) Mitral valve thickened with mild regurgitation.   4) Compared to previous study on 11/12/2006, no significant change.      Assessment and Plan:    1.  HCV GT2 cirrhosis. MELD score is low. Actively on UNOS list..  2.  Varices screening.  Stable. EGD No esophageal or gastric varices   Visualized. Cont. Propranolol 20 mg twice daily.  3.  HCC screening. Multiple small late-enhancing nodules without arterial phase enhancement.     These represent LIRADS 3 lesions. Follow up CT in 6 months  4.   Fluid retention. Well controlled with low dose diuretics.   5. Diabetes. On oral hypoglycemic. Needs to go back to endocrinology for management.   6.  Return in 3 months with Dr. Lissa Merlin  Exam by Ames Dura, RN [employer] student RN

## 2008-12-05 ENCOUNTER — Other Ambulatory Visit (HOSPITAL_BASED_OUTPATIENT_CLINIC_OR_DEPARTMENT_OTHER): Payer: Self-pay

## 2008-12-06 ENCOUNTER — Ambulatory Visit (HOSPITAL_BASED_OUTPATIENT_CLINIC_OR_DEPARTMENT_OTHER): Admitting: "Endocrinology

## 2008-12-06 ENCOUNTER — Other Ambulatory Visit (HOSPITAL_BASED_OUTPATIENT_CLINIC_OR_DEPARTMENT_OTHER): Payer: Self-pay | Admitting: "Endocrinology

## 2008-12-06 LAB — BASIC METABOLIC PANEL, BLOOD
Bicarbonate: 28 mmol/L (ref 22–29)
Calcium: 9 mg/dL (ref 8.6–10.5)
Chloride: 105 mmol/L (ref 98–107)
Creatinine: 0.75 mg/dL (ref 0.51–0.95)
GFR (African Amer.): >60 mL/min
GFR: >60 mL/min
Glucose: 202 mg/dL — ABNORMAL HIGH (ref ?–115)

## 2008-12-06 MED ORDER — SPIRONOLACTONE 25 MG OR TABS
2.00 | ORAL_TABLET | Freq: Every day | ORAL | Status: DC
Start: ? — End: 2010-07-01

## 2008-12-06 MED ORDER — INSULIN ASPART 100 UNIT/ML SC SOLN
5.0000 [IU] | SUBCUTANEOUS | Status: DC
Start: 2008-12-06 — End: 2009-02-02

## 2008-12-06 NOTE — Patient Instructions (Addendum)
 1, continue with lantus insulin 10 units at night.  No skipping  2, Check sugar before breakfast, before dinner and bedtime. Bring in log book on next visit.   3, Add novolog 5 units before dinner if BG> 150. No novolog if no food.   4, continue to have class in [employer] project.   5, Get the labs done today.   6, Call me in 1-2 weeks to give me the sugar numbers. [phone] ext [phone].   7, Return in 2 months.   8, check labs before next visit.

## 2008-12-06 NOTE — Progress Notes (Signed)
 CC: DM2 f/u.   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 She was diagnosed with DM2 5 years ago during routine blood tests. She does not have any family history of diabetes. She took Glyburide 5mg  BID. Last seen in Sept 09 as a new consult. Glyburide is switched to glipizide and was told to control her diet for better glucose control.  Added lantus 10 units from Jan. Patient states that she noticed her morning BG is better but sugar is high at night.   Diet: eating twice a day (8-9am, 3-4 pm). Usually having night time snack around 9 pm. Likes to eat bread, starches. Trying to cut down portion size of her meals. Participating in [employer] project.   Exercises: none.   Medication:      Glipizide 10 mg bid     Lantus 10 units   SMBG: patient brought in her log book. Checks twice a day.   FBG: 130-190, bedtime 300-400s mostly.   No hypos.     ROS:  no CP or palpitations, no SOB, no numbness/tingling, no polyuria, polydipsia, no N/V. No visual problems.     Past Medical History   Diagnosis Date   . Migraine Headache    . Glaucoma    . Ascites 06/29/2007     History of paracentesis x 2   . Pancreatic Cyst 06/29/2007   . Cirrhosis 06/24/2007     Active on the liver transplant waiting list with a blood type of O positive. 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 Osteopenia 06/24/2007   . Emphysema 06/24/2007   . EV (Esophageal Varices) 06/10/2007   . Anemia 06/10/2007   . HCV (Hepatitis C Virus) 06/10/2007     Achieved SVR.   Marland Kitchen DM Circ Dis Type II 06/10/2007   . HTN 06/10/2007   . HTN 06/10/2007       Allergies: Advil  Current outpatient prescriptions   Medication Sig   . spironolactone (ALDACTONE) 25 MG tablet Take 2 Tabs by mouth daily.   Marland Kitchen alendronate (FOSAMAX) 70 MG tablet Take 1 Tab by mouth every 7 days.   . furosemide (LASIX) 40 MG tablet Take 1 Tab by mouth 2 times daily.   Marland Kitchen glipiZIDE (GLUCOTROL) 10 MG  tablet Take 1 Tab by mouth 2 times daily.   . insulin glargine (LANTUS) 100 UNIT/ML injection Inject 10 Units under the skin nightly.   Marland Kitchen VITAMIN D 400 UNIT OR CAPS 1 CAPSULE DAILY   . CALCIUM CARBONATE 600 MG OR TABS 1 tablet 2 times daily   . PROPRANOLOL HCL 20 MG OR TABS Take 1 Tab by mouth 2 times daily.   Marland Kitchen spironolactone (ALDACTONE) 25 MG tablet Take 4 Tabs by mouth daily.   Marland Kitchen lisinopril (PRINIVIL, ZESTRIL) 2.5 MG tablet Take 1 Tab by mouth daily.       PHYSICAL EXAM:  Blood pressure 129/55, pulse 61, temperature 98.1 F (36.7 C), temperature source Oral, weight 145 lb (65.772 kg).  Gen: well developed, well nourished, not in distress.  Neuro: AAOx3,     LABS/STUDIES:  No new labs.     ASSESSMENT/PLAN:  This is a 68 year old female with DM2, HTN and HCV c/b cirrhosis who presents here for diabetes f/u.   1, Glucose control: poorly controlled due to uncontrolled diet.   - Instruct patient to  control portion size and restrict carbohydrates. Cont to participate in project [place] for education.   -Cont with glipizide to 10 mg BID.   - Cont with lantus 10 units qhs.   - Add meal time novolog. Will start from her dinner since it is her biggest meal of the day. Will start with 5 units and see how she does. Adjust when needed.   - Instruct patient to continue checking BG three times a day (before two meals, bedtime). Bring in log book to the clinic on her next visit.    2. Blood pressure: at goal.   - continue current regimen.  3. Lipids: LDL at goal in 2005.   - will repeat LPF.    4, Renal function: The patient has + urine microalbumin. no renal insufficiency.  - cont with lisinopril 2.5 mg qday.     5. Cardiovascular prevention: c/w aspirin.  6. Eye health: Last opthalmology Nov 2009 for glaucoma.    7. Foot health: cont self-exam.   8, Ask patient to get labs done on her way out today. Re check HbA1c, BMP  in 2 months     Her son accompanied her today and promised that he will call me in 1-2 weeks regarding her BG  control.     RTC in 2 months    Pt seen and discussed with attending Dr. Bluford Kaufmann, who agrees with the above.

## 2008-12-06 NOTE — Progress Notes (Signed)
 ENDOCRINE ATTENDING ADDENDUM:    Dr. Deniece Manning history was reviewed.   Labs/Studies were reviewed.   Case was discussed with Dr. Si Gaul and I agree with the assessment and plan as detailed in her note.    Briefly, Paula Manning is a 68 year old female with diabetes mellitus type 2 and ESLD on transplant list.  On glipizide and added lantus insulin in Jan.   Now fasting blood sugars are reasonable, but bedtime blood sugars are in 300's.  Lab Results   Component Value Date   . A1C 8.2 08/18/2008       Plan is to continue lantus and add pre-meal Novolog    Please see Dr. Deniece Manning note for further details.

## 2008-12-08 MED ORDER — LISINOPRIL 2.5 MG OR TABS
2.5000 mg | ORAL_TABLET | Freq: Every day | ORAL | Status: DC
Start: 2008-12-05 — End: 2009-02-26

## 2008-12-08 MED ORDER — INSULIN GLARGINE 100 UNIT/ML SC SOLN
10.0000 [IU] | Freq: Every evening | SUBCUTANEOUS | Status: DC
Start: 2008-12-05 — End: 2009-02-26

## 2008-12-29 ENCOUNTER — Encounter (INDEPENDENT_AMBULATORY_CARE_PROVIDER_SITE_OTHER)

## 2009-02-02 ENCOUNTER — Other Ambulatory Visit (HOSPITAL_BASED_OUTPATIENT_CLINIC_OR_DEPARTMENT_OTHER): Payer: Self-pay | Admitting: "Endocrinology

## 2009-02-02 NOTE — Telephone Encounter (Signed)
 PHARMACY REQUESTING REFILL.  LOV 4/10 NOV 7/10.  THANKS

## 2009-02-05 MED ORDER — INSULIN ASPART 100 UNIT/ML SC SOLN
5.0000 [IU] | SUBCUTANEOUS | Status: DC
Start: 2009-02-02 — End: 2010-08-22

## 2009-02-05 NOTE — Telephone Encounter (Signed)
 Thank you :)

## 2009-02-14 ENCOUNTER — Ambulatory Visit (HOSPITAL_BASED_OUTPATIENT_CLINIC_OR_DEPARTMENT_OTHER): Admitting: "Endocrinology

## 2009-02-14 NOTE — Progress Notes (Signed)
 CC: DM2 f/u.   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 She was diagnosed with DM2 5 years ago during routine blood tests. She does not have any family history of diabetes.   Last seen in April, when added novolog before dinner. Patient came back today with her son and states that she hasn't had chance to do so yet. She was busy to visit one of her sons in [place] 3-4 times a week and [place]. Patient also was afraid to do injections in general. All her insulin shots were done by her husband so far.   Diet: eating twice a day (8-9am, 3-4 pm). Usually having night time snack around 9 pm. Likes to eat bread, starches. Trying to cut down portion size of her meals. Participating in [employer] project.  Not able to do well recently because she can't control what she eats in [place].   Exercises: none.   Medication:      No metformin due to cirrhosis.      Glipizide 10 mg bid     Lantus 10 units      Novolog 5 units before dinner (not done)  SMBG: patient brought in her log book. Checks twice a day.     FBG: mostly 90-150, bedtime 189-393.     No hypos.     ROS:  no CP or palpitations, no SOB, no numbness/tingling, no polyuria, polydipsia, no N/V. No visual problems.     Past Medical History   Diagnosis Date   . Migraine Headache    . Glaucoma    . Ascites 06/29/2007     History of paracentesis x 2   . Pancreatic Cyst 06/29/2007   . Cirrhosis 06/24/2007     Active on the liver transplant waiting list with a blood type of O positive. 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 Osteopenia 06/24/2007   . Emphysema 06/24/2007   . EV (Esophageal Varices) 06/10/2007   . Anemia 06/10/2007   . HCV (Hepatitis C Virus) 06/10/2007     Achieved SVR.   Marland Kitchen DM Circ Dis Type II 06/10/2007   . HTN 06/10/2007   . HTN 06/10/2007       Allergies: Advil  Current outpatient prescriptions    Medication Sig   . lisinopril (PRINIVIL, ZESTRIL) 2.5 MG tablet Take 1 Tab by mouth daily.   . insulin glargine (LANTUS) 100 UNIT/ML injection Inject 10 Units under the skin nightly.   Marland Kitchen spironolactone (ALDACTONE) 25 MG tablet Take 2 Tabs by mouth daily.   Marland Kitchen alendronate (FOSAMAX) 70 MG tablet Take 1 Tab by mouth every 7 days.   . furosemide (LASIX) 40 MG tablet Take 1 Tab by mouth 2 times daily.   Marland Kitchen spironolactone (ALDACTONE) 25 MG tablet Take 4 Tabs by mouth daily.   Marland Kitchen glipiZIDE (GLUCOTROL) 10 MG tablet Take 1 Tab by mouth 2 times daily.   Marland Kitchen VITAMIN D 400 UNIT OR CAPS 1 CAPSULE DAILY   . CALCIUM CARBONATE 600 MG OR TABS 1 tablet 2 times daily   . PROPRANOLOL HCL 20 MG OR TABS Take 1 Tab by mouth 2 times daily.   . insulin aspart (NOVOLOG) 100 UNIT/ML injection Inject 5 Units under the skin. Only give it before dinner.       PHYSICAL EXAM:  Blood pressure 124/60, pulse 62, temperature 98.1 F (36.7 C), temperature source Oral, resp. rate 18, height 5' 5.5" (1.664 m), weight 148 lb (67.132 kg).  Gen: well developed, well nourished, not in distress.  Neuro: AAOx3,   Feet: no edema. Deferred the foot exam on this visit.     LABS/STUDIES:Results for LEONARD, HENDLER (MRN [id number]) as of 02/14/2009 14:54   Ref. Range 12/06/2008 15:47   SODIUM Latest Range: 136-145 mmol/L 139   POTASSIUM Latest Range: 3.5-5.1 mmol/L 3.9   CHLORIDE Latest Range: 98-107 mmol/L 105   BICARBONATE Latest Range: 22-29 mmol/L 28   BUN Latest Range: 6-20 mg/dL 17   CREATININE Latest Range: 0.51-0.95 mg/dL 1.32   GFR (NON-AFRICAN AMER.) No range found >60   GFR (AFRICAN AMER.) No range found >60   GLUCOSE Latest Range: Low: 70 - 115 mg/dL 440 (H)   CALCIUM Latest Range: 8.6-10.5 mg/dL 9.0   TRIGLYCERIDES Latest Range: 10-170 mg/dL 102   CHOLESTEROL Latest Range: Low: < 200 mg/dL 725   HDL-CHOLESTEROL Latest Range: Low: F: >65 mg/dL 65   LDL-CHOL (CALC) Latest Range: Low: <160 mg/dL 85   GLYCO HB (D6U) Latest Range: Low: 4.8 - 5.9 % 7.4 (H)        ASSESSMENT/PLAN:  This is a 68 year old female with DM2, HTN and HCV c/b cirrhosis who presents here for diabetes f/u.   1, Glucose control: overall BG is getting better.   - Instruct patient to control portion size and restrict carbohydrates. She has an appt with [employer] for education.   - Cont with glipizide to 10 mg BID.   - FBG controlled. Cont with lantus 10 units qhs.   - encourage patient to do insulin injections herself. Explained to her that the pain is less than her glucose check and it is important for her to get her nighttime BG controlled.  Instruct her to add pre dinner novolog 5 units. She verbals understanding and agrees to try.   2. Blood pressure: at goal.   - continue current regimen.  3. Lipids: repeated LDL at goal.    4, Renal function: The patient has + urine microalbumin. no renal insufficiency.  - cont with lisinopril 2.5 mg qday.     5. Cardiovascular prevention: c/w aspirin.  6. Eye health: Last opthalmology Nov 2009 for glaucoma.    7. Foot health: cont self-exam.   8, Re check HbA1c, BMP  in 3 months     RTC in 3 months    Pt seen and discussed with attending Dr. Cordie Grice, who agrees with the above.

## 2009-02-14 NOTE — Progress Notes (Signed)
 Endocrine Volunteer Attending Note:  History reviewed with Endocrine Fellow, Dr. Si Gaul, at Clinic today.  Patient interviewed and examined.  Salient history includes patient reluctant to take pre-dinner mealtime insulin.  We discussed the issue of microvascular complications and Carbohydrate restriction.  We also discussed using a Novolog Flexpen as a possible option, and various sites she can use.  Her son also encouraged her to try pre-dinner rapid insulin in order to improve her overall energy.    Patient very concerned re: insulin Rx and she was reassured.  All questions were answered, and she felt comfortable with plan prior to leaving.    Paula Manning

## 2009-02-14 NOTE — Patient Instructions (Addendum)
 1, Continue with lantus 10 units every day.   2, Try to get novolog insulin 5 units before dinner.   3, Continue to control your diet.   4, RTC in 23m   5, Do blood test 1 week before your next visit.

## 2009-02-22 ENCOUNTER — Ambulatory Visit (HOSPITAL_BASED_OUTPATIENT_CLINIC_OR_DEPARTMENT_OTHER): Admitting: Gastroenterology

## 2009-02-22 VITALS — BP 112/54 | HR 62 | Temp 98.7°F | Ht 66.0 in | Wt 143.0 lb

## 2009-02-22 MED ORDER — FUROSEMIDE 40 MG OR TABS
40.0000 mg | ORAL_TABLET | Freq: Every day | ORAL | Status: DC
Start: 2009-02-22 — End: 2010-07-01

## 2009-02-22 NOTE — Progress Notes (Signed)
Patient Education  Identified learning needs: Medication,uses,dose,side effects.  Learner: patient and family member   Barriers to learning: none  Readiness to learn: acceptance  Method: Explanation and handout.  Treatment education given: None  Fall prevention education given: N/A  Pain education given: N/A  Response: Verbalized understainding Written and verbal instructions  given to patient. Verbalized  Understanding. D/C ambulatory, in no apparent distress.

## 2009-02-22 NOTE — Progress Notes (Signed)
 CLINIC: [facility]  ATTENDING: Cherylin Mylar  DATE OF SERVICE: 02/22/2009    REASON FOR VISIT: Followup hep C cirrhosis    Patient Active Problem List   Diagnoses Date Noted   . Hypertension [401.9AH] 12/12/2008   . HYPERLIPIDEMIA [272.4R] 12/12/2008   . Microalbuminuria [791.0Y] 12/12/2008   . Osteoporosis [733.00C] 07/26/2007   . Ascites [789.59K] 06/29/2007     History of paracentesis x 2     . 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 Cirrhosis [571.5N] 06/24/2007     Active on the liver transplant waiting list with a blood type of O positive.  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  Due to Hep C     . Emphysema [492.8AL] 06/24/2007   . Anemia [285.9AA] 06/10/2007   . EV (Esophageal Varices) [456.1L] 06/10/2007   . Seizure Disorder [345.90DB] 06/10/2007   . 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 HCV (Hepatitis C Virus) [070.70S] 06/10/2007     H/o GT2 but has achieved SVR.       . HTN [401.9BX] 06/10/2007     INTERVAL HISTORY:  The patient was last seen in this clinic in Jan and seen by NP in April. In the interim, she has felt well.  Denies any melena/brbpr/hematemesis.  No confusion.  Denies pedal edema or increase in abdominal girth.  Appetite good, weight stable.  Not adherent to low salt diet.  Does complain of intermittent vertigo when she lies in bed and turns her head quickly lasting <1 minute.    Current outpatient prescriptions ordered prior to encounter   Medication Sig Dispense Refill   . insulin aspart (NOVOLOG) 100 UNIT/ML injection Inject 5 Units under the skin. Only give it before dinner.  10 mL  3   . lisinopril (PRINIVIL, ZESTRIL) 2.5 MG tablet Take 1 Tab by mouth daily.  30 Tab  2   . insulin glargine (LANTUS) 100 UNIT/ML injection Inject 10 Units under the skin nightly.  1 Vial  2   . spironolactone (ALDACTONE) 25 MG tablet Take 2 Tabs by mouth daily.       Marland Kitchen alendronate (FOSAMAX) 70 MG  tablet Take 1 Tab by mouth every 7 days.  4 Tab  11   . glipiZIDE (GLUCOTROL) 10 MG tablet Take 1 Tab by mouth 2 times daily.  60 Tab  3   . VITAMIN D 400 UNIT OR CAPS 1 CAPSULE DAILY  30  11   . CALCIUM CARBONATE 600 MG OR TABS 1 tablet 2 times daily  one month  11   . DISCONTD: furosemide (LASIX) 40 MG tablet Take 1 Tab by mouth 2 times daily.  60 Tab  11   . DISCONTD: spironolactone (ALDACTONE) 25 MG tablet Take 4 Tabs by mouth daily.  120 Tab  11   . DISCONTD: PROPRANOLOL HCL 20 MG OR TABS Take 1 Tab by mouth 2 times daily.  0  0     REVIEW OF SYSTEMS:  As described above.  All other systems negative.    PHYSICAL EXAM:  BP 112/54  Pulse 62  Temp(Src) 98.7 F (37.1 C) (Oral)  Ht 5\' 6"  (1.676 m)  Wt 143 lb (64.864 kg)  SpO2 96%  General:  Alert and oriented x 3, conversant, pleasant and appropriate.  ENT: Oropharynx is clear, mucus membranes are moist.  Pulmonary:  Clear to  auscultation bilaterally.  Cardiovascular: Regular rate and rhythm. No murmurs, rubs, gallops.  GI: Abdomen soft, non-tender, non-distended, bowel sounds normal.  Extremities: Trace BLE edema, No asterixis    LABS:  HEP LIVER TRANSPLANT Latest Ref Rng 12/06/2008 08/18/2008   WBC 4.0 - 11.0 1000/mm3  3.2 (L)   SEGS 45 - 70 %     BANDS 0 - 15 %     HGB 12.0 - 16.0 gm/dL  47.8   HCT 29.5 - 62.1 %  38.0   PLT 130 - 400 1000/mm3  53 (L)   INR   1.3   NA 136 - 145 mmol/L 139 138   K 3.5 - 5.1 mmol/L 3.9 4.1   CL 98 - 107 mmol/L 105 104   BI 22 - 29 mmol/L 28 28   BUN 6 - 20 mg/dL 17 12   CR 3.08 - 6.57 mg/dL 8.46 9.62   GLUC Low: 70 - 115 mg/dL 952 (H) 841 (H)   AST Low: F: 0 - 32 U/L  28   ALT Low: F: 0 - 33 U/L  31   TBILI Low: <1.2 mg/dL  1.3 (H)   DBILI Low: <3.2 mg/dL  0.3 (H)   ALK PHOS Low: F: 35-140 U/L  103   GGT Low: <30 IU/L     ALB 3.5 - 5.2 gm/dL  3.9   AFP Low: <8 ng/mL     MAG 1.8 - 2.5 mg/dL     PHOS 2.5 - 4.5 mg/dL     CAL 8.6 - 44.0 mg/dL 9.0 8.9   UR Low: N:0.2-7.2 mg/dL     TSH 5.36 - 6.44 uIU/mL     T4 4.5 - 10.9 mcg/dL      CHOLESTEROL Low: < 200 mg/dL 034    TRIGLYCERIDES 10 - 170 mg/dL 742      STUDIES:  5/95/63 MRI abdomen  1. Nodule assessment - in LIRADS Summary Study:  Multiple small late-enhancing nodules without arterial phase enhancement.   These represent LIRADS 3 lesions.    2. Other significant findings:  Cirrhosis.  Nonocclusive thrombus within the portal vein, extending to the confluence and   into the superior mesenteric vein, not appreciably changed from CT of   12/21/2007.  Splenomegaly and paraesophageal varices.  Multiple cysts within the uncinate process, head, body, and tail of the   pancreas, as well as pancreatic ductal dilatation and some irregularity within  the pancreatic duct contour. There are no solid enhancing components and   there has been no change in theses lesions. These likely represent a   multifocal, predominantly side branch, benign intraductal papillary mucinous   neoplasm (IPMN). Attention to this finding can be performed on follow-up   examinations.    3. Recommend six month follow-up imaging on the 3T magnet under the   supervision of Dr. Harvest Forest.     4. Correction regarding contrast based allergies: Contrary to prior radiology   reports, the patient does not have a contrast allergy to gadolinium based   contrast agents. Dr. Manuela Neptune spoke at length, in Spanish, with the patient,   her husband, and her son, who all clearly denied a reaction to gadolinium   based agent in the past. Instead, she explained that she gets nervous in the   scanner, and her throat becomes dry. During this examination, the patient was  monitored with a pulse oximeter throughout the entirety of the procedure and   tolerated the contrast agent well. She  had no respiratory complaints after   the administration of the contrast agent and tolerated the entire procedure   without premedication.    5. Technical recommendation: With coaching, the patient is a good breath   holder, but does continue to have  physiologic motion, perhaps due to bowel   peristalsis or cardiac motion. Recommend 3-D acquisition with high temporal   resolution and low spatial resolution, as well as parallel imaging, when this   patient is scanned in the future.    6. Excretion of Eovist is slow; suggest the patient have 30 minute delayed   imaging on subsequent scans.    11/01/08 EGD  1) No esophageal or gastric varices visualized.  2) Portal hypertensive gastropathy with mild oozing at the gastroesophageal junction secondary to scope trauma.  3) White plaques in the duodenum that were biopsied.    Pathology: Small intestine, duodenum, biopsy    -Focal mucosal erosions with acute inflammation, of duodenal mucosa.      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    1. Hepatitis C - GT 2, achieved SVR after treatment    2. Ascites - Well controlled.  Decrease lasix from 40mg  bid to 40mg  qday.  Decrease aldactone 100mg  daily to 50mg  daily.  Not compliant with low salt diet, stressed importance.    3. History of varices without bleeding - Last EGD 3/10 with no varices.  D/C propranolol.  Repeat in 1 year.    4. IPMN - Not a contraindication to OLT.  Following with imaging.  Repeat MRI next month.    5. HCC screening - MRI done 2/10, repeat ordered to be done 8/10 with AFP.  Will do on 3T scanner with Dr. Manuela Neptune per radiology recommendations    6. Cirrhosis - Fairly well compensated.  Update labs 1-2 weeks before MRI.    7. Diabetes - Managed by endocrine.    Return to liver transplant clinic in 4 months.    Electronically Signed By  Lucile Crater, M.D.  Fellow, Department of Gastroenterology  02/22/2009

## 2009-02-22 NOTE — Patient Instructions (Signed)
-   Decrease lasix to 40mg  once a day  - Decrease spironolactone to 50mg  once a day  - Stop taking propranolol  - Schedule an MRI for late August  - Have your blood tests done 1-2 weeks before your MRI appointment  - You should be on a low salt diet

## 2009-02-22 NOTE — Progress Notes (Signed)
 Attending Note:    Subjective:  I reviewed the history.  Patient interviewed and examined.  History of present illness (HPI):       Review of Systems (ROS): As per the fellow's note.  Past Medical, Family, Social History:  As per the fellow's  note.    Objective:   I have examined the patient and I concur with the fellow's exam.  Assessment and plan reviewed with the resident physician.  I agree with the fellow's plan as documented.  See the fellow's note for further details.    IMPRESSIONS: This is a pleasant 68 year old woman with HCV GT2 cirrhosis (achieved SVR) fairly well compensated who is active on the liver transplant list. 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 She is otherwise doing well. The likely IPMT is not a barrier to her transplant candidacy and we will continue to monitor it with serial imaging studies. Her MELD score is low and her diabetes is poorly controlled. Abd CT in 09/2008 did not show any suspicious lesions and the pancreatic cysts are stable.    1) Varices screening. Repeat EGD in 10/2009.No varices on last EGD in 10/2008 and no history of GI bleeding so we will stop the propranolol today.  2) HCC screening. Repeat abd MRI on the 3T scanner supervised by Dr. Manuela Neptune in August.  3) Fluid retention. Decrease lasix to 40 mg daily and aldactone to 50 mg daily.  4) Diabetes. On oral hypoglycemic. Needs to go back to endocrinology for management.   5) Return in 4 months.

## 2009-02-22 NOTE — Progress Notes (Signed)
 Patient is a 68 year old Spanish speaking female.  Met with her and her son, [name], in clinic.  Patient was alert and oriented.  She continues to be independent with all ADLs and IADLs.  She reported feeling well other than some dizziness.  She reported taking medications as prescribed and checking her bs daily.  She is now on insulin.  She continues to identify her husband and son as caregivers for post transplant needs.  Her son works but may be eligible for PFL.  Encouraged her to contact Child psychotherapist with any concerns.    Will follow-up as needed and remain available.

## 2009-02-26 ENCOUNTER — Other Ambulatory Visit (HOSPITAL_BASED_OUTPATIENT_CLINIC_OR_DEPARTMENT_OTHER): Payer: Self-pay | Admitting: "Endocrinology

## 2009-02-26 MED ORDER — INSULIN GLARGINE 100 UNIT/ML SC SOLN
10.0000 [IU] | Freq: Every evening | SUBCUTANEOUS | Status: DC
Start: 2009-02-26 — End: 2009-06-13

## 2009-02-26 MED ORDER — LISINOPRIL 2.5 MG OR TABS
2.5000 mg | ORAL_TABLET | Freq: Every day | ORAL | Status: DC
Start: 2009-02-26 — End: 2009-07-30

## 2009-03-12 ENCOUNTER — Ambulatory Visit (INDEPENDENT_AMBULATORY_CARE_PROVIDER_SITE_OTHER)

## 2009-03-12 ENCOUNTER — Ambulatory Visit (INDEPENDENT_AMBULATORY_CARE_PROVIDER_SITE_OTHER): Admitting: Ophthalmology

## 2009-04-05 ENCOUNTER — Other Ambulatory Visit (HOSPITAL_BASED_OUTPATIENT_CLINIC_OR_DEPARTMENT_OTHER): Payer: Self-pay | Admitting: Gastroenterology

## 2009-04-19 ENCOUNTER — Encounter (INDEPENDENT_AMBULATORY_CARE_PROVIDER_SITE_OTHER): Payer: Self-pay | Admitting: Family Medicine

## 2009-04-23 ENCOUNTER — Other Ambulatory Visit (HOSPITAL_BASED_OUTPATIENT_CLINIC_OR_DEPARTMENT_OTHER): Payer: Self-pay | Admitting: Internal Medicine

## 2009-04-23 ENCOUNTER — Other Ambulatory Visit (HOSPITAL_BASED_OUTPATIENT_CLINIC_OR_DEPARTMENT_OTHER): Payer: Self-pay | Admitting: "Endocrinology

## 2009-04-23 LAB — HEMOGRAM, BLOOD
Hct: 37.3 % (ref 36.0–46.0)
Hgb: 12.6 g/dL (ref 12.0–16.0)
MCHC: 33.9 % (ref 32–37)
MCV: 96.9 um3 (ref 82.0–98.0)
Plt Count: 47 10*3/uL — ABNORMAL LOW (ref 130–400)
RBC: 3.85 10*6/uL — ABNORMAL LOW (ref 4.00–5.00)
RDW: 15.9 % — ABNORMAL HIGH (ref 10–15)
WBC: 2.8 10*3/uL — ABNORMAL LOW (ref 4.0–11.0)

## 2009-04-23 LAB — COMPREHENSIVE METABOLIC PANEL, BLOOD
ALT (SGPT): 31 U/L
AST (SGOT): 28 U/L
Albumin: 3.5 g/dL (ref 3.5–5.2)
Alkaline Phos: 99 U/L
BUN: 12 mg/dL (ref 6–20)
Bilirubin, Tot: 1 mg/dL (ref <1.2)
Calcium: 8.8 mg/dL (ref 8.6–10.5)
Chloride: 106 mmol (ref 98–107)
Creatinine: 0.71 mg/dL (ref 0.51–0.95)
GFR (African Amer.): >60 mL/min
GFR: >60 mL/min
Glucose: 98 mg/dL (ref ?–115)
Potassium: 4.5 mmol/L (ref 3.5–5.1)
Sodium: 141 mmol/L (ref 136–145)

## 2009-04-23 LAB — PROTHROMBIN TIME, BLOOD
INR: 1.4
PT,Patient: 15.2 s — ABNORMAL HIGH (ref 9.7–12.5)

## 2009-04-23 LAB — GLYCOSYLATED HGB(A1C), BLOOD: Glyco Hgb (A1C): 6.9 % — ABNORMAL HIGH (ref ?–5.9)

## 2009-04-30 ENCOUNTER — Other Ambulatory Visit (HOSPITAL_BASED_OUTPATIENT_CLINIC_OR_DEPARTMENT_OTHER): Payer: Self-pay | Admitting: Internal Medicine

## 2009-05-23 ENCOUNTER — Encounter (HOSPITAL_BASED_OUTPATIENT_CLINIC_OR_DEPARTMENT_OTHER): Admitting: "Endocrinology

## 2009-06-13 ENCOUNTER — Other Ambulatory Visit (HOSPITAL_BASED_OUTPATIENT_CLINIC_OR_DEPARTMENT_OTHER): Payer: Self-pay

## 2009-06-13 MED ORDER — INSULIN GLARGINE 100 UNIT/ML SC SOLN
10.0000 [IU] | Freq: Every evening | SUBCUTANEOUS | Status: DC
Start: 2009-06-13 — End: 2014-07-04

## 2009-06-14 ENCOUNTER — Encounter (HOSPITAL_BASED_OUTPATIENT_CLINIC_OR_DEPARTMENT_OTHER): Admitting: Gastroenterology

## 2009-06-28 ENCOUNTER — Encounter (HOSPITAL_BASED_OUTPATIENT_CLINIC_OR_DEPARTMENT_OTHER): Payer: Self-pay | Admitting: Gastroenterology

## 2009-07-17 ENCOUNTER — Ambulatory Visit (HOSPITAL_BASED_OUTPATIENT_CLINIC_OR_DEPARTMENT_OTHER): Admitting: Gastroenterology

## 2009-07-30 ENCOUNTER — Other Ambulatory Visit (HOSPITAL_BASED_OUTPATIENT_CLINIC_OR_DEPARTMENT_OTHER): Payer: Self-pay

## 2009-07-30 MED ORDER — LISINOPRIL 2.5 MG OR TABS
2.5000 mg | ORAL_TABLET | Freq: Every day | ORAL | Status: DC
Start: 2009-07-30 — End: 2010-08-22

## 2009-08-12 ENCOUNTER — Emergency Department
Admit: 2009-08-12 | Discharge: 2009-08-15 | Disposition: A | Discharge status: Home Health | Payer: Self-pay | Attending: Emergency Medicine | Admitting: Emergency Medicine

## 2009-08-12 LAB — COMPREHENSIVE METABOLIC PANEL, BLOOD
ALT (SGPT): 30 U/L (ref 0–33)
AST (SGOT): 26 U/L (ref 0–32)
Albumin: 3.4 g/dL — ABNORMAL LOW (ref 3.5–5.2)
Alkaline Phos: 86 U/L (ref 35–140)
BUN: 30 mg/dL — ABNORMAL HIGH (ref 6–20)
Bicarbonate: 23 mmol/L (ref 22–29)
Bilirubin, Tot: 1.2 mg/dL — ABNORMAL HIGH (ref <1.2)
Calcium: 8.4 mg/dL — ABNORMAL LOW (ref 8.6–10.5)
Chloride: 104 mmol/L (ref 98–107)
Creatinine: 0.82 mg/dL (ref 0.51–0.95)
Glucose: 299 mg/dL — ABNORMAL HIGH (ref 70–115)
Potassium: 4.5 mmol/L (ref 3.5–5.1)
Sodium: 134 mmol/L — ABNORMAL LOW (ref 136–145)
Total Protein: 6.7 g/dL (ref 6.0–8.0)

## 2009-08-12 LAB — CBC WITH DIFF, BLOOD
Abs Lymphs: 1.1 10*3/uL (ref 0.8–3.1)
Abs Monos: 0.6 10*3/uL (ref 0.2–0.8)
Absolute Neutrophil Count: 6.1 10*3/uL (ref 1.6–7.0)
Hct: 32 % — ABNORMAL LOW (ref 34.0–45.0)
Hgb: 11.5 g/dL (ref 11.2–15.7)
Lymphocytes: 14 % — ABNORMAL LOW (ref 19–53)
MCH: 32.7 pg — ABNORMAL HIGH (ref 26.0–32.0)
MCHC: 35.9 % (ref 32.0–36.0)
MCV: 90.9 um3 (ref 79.0–95.0)
MPV: 10.3 fL (ref 9.4–12.4)
Monocytes: 7 % (ref 5–12)
Plt Count: 55 10*3/uL — ABNORMAL LOW (ref 160–370)
RBC: 3.52 10*6/uL — ABNORMAL LOW (ref 3.90–5.20)
RDW: 14.2 % — ABNORMAL HIGH (ref 12.0–14.0)
Segs: 78 % — ABNORMAL HIGH (ref 34–71)
WBC: 7.9 10*3/uL (ref 4.0–10.0)

## 2009-08-12 LAB — APTT, BLOOD: PTT: 28.4 s (ref 25.0–34.0)

## 2009-08-12 LAB — GFR: GFR: >60 mL/min

## 2009-08-12 LAB — PROTHROMBIN TIME, BLOOD
INR: 1.5
PT,Patient: 16.6 s — ABNORMAL HIGH (ref 9.7–12.5)

## 2009-08-13 ENCOUNTER — Telehealth (HOSPITAL_BASED_OUTPATIENT_CLINIC_OR_DEPARTMENT_OTHER): Payer: Self-pay | Admitting: Orthopaedic Surgery

## 2009-08-13 LAB — GLUCOSE POCT, BLOOD: Meter Glucose (POC): 348 mg/dL — ABNORMAL HIGH (ref 70–110)

## 2009-08-14 ENCOUNTER — Ambulatory Visit (HOSPITAL_BASED_OUTPATIENT_CLINIC_OR_DEPARTMENT_OTHER): Admitting: Orthopaedic Surgery

## 2009-08-24 ENCOUNTER — Encounter (HOSPITAL_BASED_OUTPATIENT_CLINIC_OR_DEPARTMENT_OTHER): Payer: Self-pay | Admitting: Orthopaedic Surgery

## 2009-08-28 ENCOUNTER — Ambulatory Visit (HOSPITAL_BASED_OUTPATIENT_CLINIC_OR_DEPARTMENT_OTHER): Admitting: Orthopaedic Surgery

## 2009-08-28 VITALS — BP 138/66 | HR 69 | Temp 98.0°F

## 2009-08-29 ENCOUNTER — Telehealth (HOSPITAL_BASED_OUTPATIENT_CLINIC_OR_DEPARTMENT_OTHER): Payer: Self-pay | Admitting: Orthopaedic Surgery

## 2009-09-14 ENCOUNTER — Ambulatory Visit (INDEPENDENT_AMBULATORY_CARE_PROVIDER_SITE_OTHER): Admitting: Ophthalmology

## 2009-09-14 ENCOUNTER — Ambulatory Visit (INDEPENDENT_AMBULATORY_CARE_PROVIDER_SITE_OTHER)

## 2009-09-21 ENCOUNTER — Other Ambulatory Visit: Payer: Self-pay | Admitting: Diagnostic Radiology

## 2009-09-25 ENCOUNTER — Ambulatory Visit (HOSPITAL_BASED_OUTPATIENT_CLINIC_OR_DEPARTMENT_OTHER): Admitting: Orthopaedic Surgery

## 2009-09-28 ENCOUNTER — Ambulatory Visit (INDEPENDENT_AMBULATORY_CARE_PROVIDER_SITE_OTHER): Admitting: Ophthalmology

## 2009-10-08 ENCOUNTER — Ambulatory Visit (INDEPENDENT_AMBULATORY_CARE_PROVIDER_SITE_OTHER): Admitting: Ophthalmology

## 2009-10-15 ENCOUNTER — Encounter (HOSPITAL_BASED_OUTPATIENT_CLINIC_OR_DEPARTMENT_OTHER): Payer: Self-pay | Admitting: Adult Health

## 2009-10-16 ENCOUNTER — Encounter (HOSPITAL_BASED_OUTPATIENT_CLINIC_OR_DEPARTMENT_OTHER): Payer: Self-pay | Admitting: Adult Health

## 2009-10-16 LAB — COMPREHENSIVE METABOLIC PANEL, BLOOD
ALT (SGPT): 18 U/L (ref 0–33)
AST (SGOT): 22 U/L (ref 0–32)
Albumin: 3.4 g/dL — ABNORMAL LOW (ref 3.5–5.2)
Alkaline Phos: 103 U/L (ref 35–140)
BUN: 14 mg/dL (ref 6–20)
Bicarbonate: 25 mmol/L (ref 22–29)
Bilirubin, Tot: 1.1 mg/dL (ref <1.2)
Calcium: 9 mg/dL (ref 8.6–10.5)
Chloride: 104 mmol/L (ref 98–107)
Creatinine: 0.71 mg/dL (ref 0.51–0.95)
Glucose: 159 mg/dL — ABNORMAL HIGH (ref 70–115)
Potassium: 4.3 mmol/L (ref 3.5–5.1)
Sodium: 138 mmol/L (ref 136–145)
Total Protein: 7.4 g/dL (ref 6.0–8.0)

## 2009-10-16 LAB — CBC WITH DIFF, BLOOD
Abs Eosinophils: 0.1 10*3/uL (ref 0.0–0.5)
Abs Lymphs: 0.7 10*3/uL — ABNORMAL LOW (ref 0.8–3.1)
Abs Monos: 0.2 10*3/uL (ref 0.2–0.8)
Absolute Neutrophil Count: 1.8 10*3/uL (ref 1.6–7.0)
Eosinophils: 2 % (ref 1–7)
Hct: 36.4 % (ref 34.0–45.0)
Hgb: 12.6 g/dL (ref 11.2–15.7)
Lymphocytes: 24 % (ref 19–53)
MCH: 31.6 pg (ref 26.0–32.0)
MCHC: 34.6 % (ref 32.0–36.0)
MCV: 91.2 um3 (ref 79.0–95.0)
MPV: 10.3 fL (ref 9.4–12.4)
Monocytes: 8 % (ref 5–12)
Plt Count: 37 10*3/uL — ABNORMAL LOW (ref 160–370)
RBC: 3.99 10*6/uL (ref 3.90–5.20)
RDW: 13.7 % (ref 12.0–14.0)
Segs: 66 % (ref 34–71)
WBC: 2.7 10*3/uL — ABNORMAL LOW (ref 4.0–10.0)

## 2009-10-16 LAB — PROTHROMBIN TIME, BLOOD
INR: 1.4
PT,Patient: 15.9 s — ABNORMAL HIGH (ref 9.7–12.5)

## 2009-10-16 LAB — ALPHA FETOPROTEIN, BLOOD: AFP: 1.4 ng/mL (ref 0.0–8.3)

## 2009-10-16 LAB — GFR: GFR: >60 mL/min

## 2009-10-16 LAB — BILIRUBIN, DIR BLOOD: Bilirubin, Dir: 0.3 mg/dL — ABNORMAL HIGH (ref <0.2)

## 2009-10-23 ENCOUNTER — Ambulatory Visit (HOSPITAL_BASED_OUTPATIENT_CLINIC_OR_DEPARTMENT_OTHER): Admitting: Gastroenterology

## 2009-10-23 ENCOUNTER — Ambulatory Visit (HOSPITAL_BASED_OUTPATIENT_CLINIC_OR_DEPARTMENT_OTHER): Admitting: Orthopaedic Surgery

## 2009-10-24 ENCOUNTER — Other Ambulatory Visit (HOSPITAL_BASED_OUTPATIENT_CLINIC_OR_DEPARTMENT_OTHER): Payer: Self-pay | Admitting: Adult Health

## 2009-12-03 ENCOUNTER — Other Ambulatory Visit (HOSPITAL_BASED_OUTPATIENT_CLINIC_OR_DEPARTMENT_OTHER): Payer: Self-pay | Admitting: Adult Health

## 2009-12-03 ENCOUNTER — Ambulatory Visit (INDEPENDENT_AMBULATORY_CARE_PROVIDER_SITE_OTHER): Admitting: Ophthalmology

## 2009-12-07 ENCOUNTER — Other Ambulatory Visit (HOSPITAL_BASED_OUTPATIENT_CLINIC_OR_DEPARTMENT_OTHER): Payer: Self-pay | Admitting: "Endocrinology

## 2009-12-07 MED ORDER — GLIPIZIDE 10 MG OR TABS
10.0000 mg | ORAL_TABLET | Freq: Two times a day (BID) | ORAL | Status: DC
Start: 2009-12-07 — End: 2010-04-09

## 2009-12-12 ENCOUNTER — Ambulatory Visit
Admit: 2009-12-12 | Discharge: 2009-12-15 | Payer: Self-pay | Source: Ambulatory Visit | Attending: Gastroenterology | Admitting: Gastroenterology

## 2009-12-25 ENCOUNTER — Ambulatory Visit (HOSPITAL_BASED_OUTPATIENT_CLINIC_OR_DEPARTMENT_OTHER): Admitting: Orthopaedic Surgery

## 2009-12-25 VITALS — BP 132/72 | HR 78 | Temp 98.0°F | Wt 139.2 lb

## 2009-12-31 ENCOUNTER — Encounter (HOSPITAL_BASED_OUTPATIENT_CLINIC_OR_DEPARTMENT_OTHER): Payer: Self-pay | Admitting: Adult Health

## 2009-12-31 LAB — CBC WITH DIFF, BLOOD
Abs Lymphs: 0.8 10*3/uL (ref 0.8–3.1)
Abs Monos: 0.3 10*3/uL (ref 0.2–0.8)
Absolute Neutrophil Count: 2.2 10*3/uL (ref 1.6–7.0)
Eosinophils: 1 % (ref 1–7)
Hct: 37.4 % (ref 34.0–45.0)
Hgb: 13.2 g/dL (ref 11.2–15.7)
Lymphocytes: 24 % (ref 19–53)
MCH: 32.3 pg — ABNORMAL HIGH (ref 26.0–32.0)
MCHC: 35.3 % (ref 32.0–36.0)
MCV: 91.4 um3 (ref 79.0–95.0)
MPV: 10.5 fL (ref 9.4–12.4)
Monocytes: 8 % (ref 5–12)
Plt Count: 39 10*3/uL — ABNORMAL LOW (ref 160–370)
RBC: 4.09 10*6/uL (ref 3.90–5.20)
RDW: 14.3 % — ABNORMAL HIGH (ref 12.0–14.0)
Segs: 67 % (ref 34–71)
WBC: 3.3 10*3/uL — ABNORMAL LOW (ref 4.0–10.0)

## 2009-12-31 LAB — COMPREHENSIVE METABOLIC PANEL, BLOOD
ALT (SGPT): 33 U/L (ref 0–33)
AST (SGOT): 30 U/L (ref 0–32)
Albumin: 3.8 g/dL (ref 3.5–5.2)
Alkaline Phos: 113 U/L (ref 35–140)
BUN: 13 mg/dL (ref 6–20)
Bicarbonate: 29 mmol/L (ref 22–29)
Bilirubin, Tot: 1.5 mg/dL — ABNORMAL HIGH (ref <1.2)
Calcium: 9.6 mg/dL (ref 8.6–10.5)
Chloride: 106 mmol/L (ref 98–107)
Creatinine: 0.8 mg/dL (ref 0.51–0.95)
Glucose: 110 mg/dL (ref 70–115)
Potassium: 4.4 mmol/L (ref 3.5–5.1)
Sodium: 140 mmol/L (ref 136–145)
Total Protein: 7.8 g/dL (ref 6.0–8.0)

## 2009-12-31 LAB — PROTHROMBIN TIME, BLOOD
INR: 1.4
PT,Patient: 15.9 s — ABNORMAL HIGH (ref 9.7–12.5)

## 2009-12-31 LAB — BILIRUBIN, DIR BLOOD: Bilirubin, Dir: 0.4 mg/dL — ABNORMAL HIGH (ref <0.2)

## 2009-12-31 LAB — ALPHA FETOPROTEIN, BLOOD: AFP: 1.8 ng/mL (ref 0.0–8.3)

## 2009-12-31 LAB — GFR: GFR: >60 mL/min

## 2010-01-16 ENCOUNTER — Ambulatory Visit (HOSPITAL_BASED_OUTPATIENT_CLINIC_OR_DEPARTMENT_OTHER): Admitting: Gastroenterology

## 2010-01-16 NOTE — Progress Notes (Signed)
 Attending Note:    Subjective:  I reviewed the history.  Patient interviewed and examined.  History of present illness (HPI):  Recheck     Review of Systems (ROS): As per the fellow's note.  Past Medical, Family, Social History:  As per the fellow's  note.    Objective:   I have examined the patient and I concur with the fellow's exam.  Assessment and plan reviewed with the fellow. I agree with the fellow's plan as documented.  See the fellow's note for further details.    IMPRESSIONS: This is a pleasant 69 year old woman with HCV GT2 cirrhosis (achieved SVR) fairly well compensated who is active on the liver transplant list. 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 She is otherwise doing well. The likely IPMT is not a barrier to her transplant candidacy and we will continue to monitor it with serial imaging studies. Her MELD score is low. Proximal humerus fx 08/2009.    1) Varices screening. EGD showed no varices only mild portal gastropathy.  2) HCC screening. MRI in 12/2009 did not show any suspicious lesions and the pancreatic cysts are stable. Repeat MRI in 06/2010.  3) Fluid retention. Continue lasix 40 mg daily and aldactone 50 mg daily.   4) Diabetes. On oral hypoglycemic.  5) Return to hepatology clinic in 6 months.

## 2010-01-16 NOTE — Progress Notes (Signed)
 CLINIC: [facility]  ATTENDING: Cherylin Mylar  DATE OF SERVICE: 01/16/2010    REASON FOR VISIT: Followup hep C cirrhosis    Patient Active Problem List   Diagnoses Date Noted   . Proximal humerus fracture DOI: 08/11/08 [place]  [812.00T] 08/14/2009   . Hypertension [401.9AH] 12/12/2008   . Hyperlipidemia [272.4R] 12/12/2008   . Microalbuminuria [791.0Y] 12/12/2008   . Osteoporosis [733.00C] 07/26/2007   . Ascites [789.59K] 06/29/2007     History of paracentesis x 2     . 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 Cirrhosis [571.5N] 06/24/2007     Removed from liver transplant waiting list due to low MELD and low likelihood of requiring a transplant in the near future.  Blood type of O positive.  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 Emphysema [492.8AL] 06/24/2007   . Anemia [285.9AA] 06/10/2007   . EV (Esophageal Varices) [456.1L] 06/10/2007   . Seizure Disorder [345.90DB] 06/10/2007   . 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 HCV (Hepatitis C Virus) [070.70S] 06/10/2007     H/o GT2 but has achieved SVR.       . HTN [401.9AH] 06/10/2007       INTERVAL HISTORY:  The patient was last seen in this clinic on 10/23/09. In the interim, she has done well.  Only complaint is occasional throbbing sensation (lasting seconds) in the RLQ since the time of her endoscopy.  Not associated with any changes in bowel habits, nausea/vomiting, no abdominal pain.  No GI bleeding, confusion, fluid overload.      Current outpatient prescriptions ordered prior to encounter   Medication Sig Dispense Refill   . glipiZIDE (GLUCOTROL) 10 MG tablet Take 1 tablet by mouth 2 times daily.  60 tablet  3   . insulin glargine (LANTUS) 100 UNIT/ML injection Inject 10 Units under the skin nightly.  1 Vial  2   . furosemide (LASIX) 40 MG tablet Take 1 Tab by mouth daily.  60 Tab  11   . spironolactone (ALDACTONE) 25 MG  tablet Take 2 Tabs by mouth daily.       Marland Kitchen alendronate (FOSAMAX) 70 MG tablet Take 1 Tab by mouth every 7 days.  4 Tab  11   . VITAMIN D 400 UNIT OR CAPS 1 CAPSULE DAILY  30  11   . CALCIUM CARBONATE 600 MG OR TABS 1 tablet 2 times daily  one month  11   . lisinopril (PRINIVIL, ZESTRIL) 2.5 MG tablet Take 1 Tab by mouth daily.  30 Tab  2   . insulin aspart (NOVOLOG) 100 UNIT/ML injection Inject 5 Units under the skin. Only give it before dinner.  10 mL  3       REVIEW OF SYSTEMS:  As described above.  All other systems negative.    PHYSICAL EXAM:  BP 128/62  Pulse 68  Temp(Src) 97.8 F (36.6 C) (Oral)  Resp 16  Ht 5\' 5"  (1.651 m)  Wt 134 lb (60.782 kg)  BMI 22.30 kg/m2  SpO2 99%  General:  Alert and oriented x 3, conversant, pleasant and appropriate.  ENT: Oropharynx is clear, mucus membranes are moist.  Pulmonary:  Clear to auscultation bilaterally.  Cardiovascular: Regular rate and rhythm. No murmurs, rubs, gallops.  GI: Abdomen soft, non-tender, non-distended,  bowel sounds normal  Extremities: No lower extremity edema.      LABS:  HEP HEPATOLOGY Latest Ref Rng 12/31/2009 10/16/2009   WBC 4.0 - 10.0 1000/mm3 3.3 (L) 2.7 (L)   SEGS 45 - 70 %     BAND 0 - 15 %     ANC 1.6 - 7.0 1000/mm3 2.2 1.8   HGB 11.2 - 15.7 gm/dL 54.0 98.1   HCT 19.1 - 45.0 % 37.4 36.4   PLT 160 - 370 1000/mm3 39 (L) 37 (L)   INR  1.4 1.4   NA 136 - 145 mmol/L 140 138   K 3.5 - 5.1 mmol/L 4.4 4.3   CL 98 - 107 mmol/L 106 104   BI 22 - 29 mmol/L 29 25   BUN 6 - 20 mg/dL 13 14   CR 4.78 - 2.95 mg/dL 6.21 3.08   GLUC 70 - 657 mg/dL 846 962 (H)   AST 0 - 32 U/L 30 22   ALT 0 - 33 U/L 33 18   TBILI Low: <1.2 mg/dL 1.5 (H) 1.1   DBILI Low: <0.2 mg/dL 0.4 (H) 0.3 (H)   ALK PHOS 35 - 140 U/L 113 103   GGT Low: <30 IU/L     ALB 3.5 - 5.2 g/dL 3.8 3.4 (L)   AFP Low: <8 ng/mL     MAG 1.8 - 2.5 mg/dL     PHOS 2.5 - 4.5 mg/dL     CAL 8.6 - 95.2 mg/dL 9.6 9.0         STUDIES:  01/02/10 MRI  1. Nodule assessment:  Multiple stable small late-enhancing  nodules without arterial phase enhancement (LIRADS 2).   2. Other significant findings:  Cirrhosis with splenomegaly.  Severe iron overload within the liver.  Persistent nonocclusive thrombus at the portal vein confluence and extending into the cephalad aspect of the superior mesenteric vein posteriorly    Stable appearing pancreas with scattered cysts, pancreatic ductal dilatation and irregularity within the pancreatic duct contour. These likely represent a multifocal, predominantly side branch, benign intraductal papillary mucinous neoplasm (IPMN). Attention to this finding can be performed on follow-up examinations.      12/12/09 EGD  1) No esophageal varices, small hiatal hernia  2) Mild portal hypertensive gastropathy  3) No gastric varices  4) Normal examined duodenum to 2nd portion        ASSESSMENT/PLAN:   This is a pleasant 69 year old woman with HCV GT2 cirrhosis (achieved SVR) fairly well compensated who was previously on the OLT waiting list, but removed recently due to low MELD and low likelihood of requiring a transplant in the near future.    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  Would not be a contra-indication to OLT in the future if needed.  2) Variceal screening - No varices on EGD 12/2009, repeat EGD in 2 years  3) HCC and IPMN screening - Stable MRI 01/02/10, repeat in 6 months  4) Partial portal vein thrombus - Stable on imaging since 09/2008.  5) Fluid retention - None on exam today.  Continue lasix 40mg , aldactone 50mg  daily  6) Diabetes - On oral hypoglycemic  7) Proximal humerus fx 08/2009 - Managed conservatively due to high operative risk with cirrhosis      Return to hepatology clinic in 6 months.    Electronically Signed By  Lucile Crater, M.D.  Fellow, Department of Gastroenterology  01/16/2010

## 2010-01-16 NOTE — Progress Notes (Signed)
 Written and verbal instructions  given to patient. Verbalized  Understanding. D/C ambulatory,with family,  in no apparent distress.    Patient Education  Identified learning needs: Medication,uses,dose,side effects.  Learner: patient and family member   Barriers to learning: none  Readiness to learn: acceptance  Method: Explanation and handout.  Treatment education given: None  Fall prevention education given: N/A  Pain education given: N/A  Response: Verbalized understainding

## 2010-02-06 ENCOUNTER — Encounter (HOSPITAL_BASED_OUTPATIENT_CLINIC_OR_DEPARTMENT_OTHER): Payer: Self-pay | Admitting: Gastroenterology

## 2010-03-13 ENCOUNTER — Other Ambulatory Visit (HOSPITAL_BASED_OUTPATIENT_CLINIC_OR_DEPARTMENT_OTHER): Payer: Self-pay | Admitting: Adult Health

## 2010-03-26 ENCOUNTER — Ambulatory Visit (HOSPITAL_BASED_OUTPATIENT_CLINIC_OR_DEPARTMENT_OTHER): Admitting: Orthopaedic Surgery

## 2010-03-26 VITALS — BP 127/73 | HR 72 | Temp 98.2°F

## 2010-03-26 NOTE — Progress Notes (Signed)
 Attending provider Jola Babinski MD    DOI: 08/11/09    Chief Complaint: left Shoulder Pain proximal humerus fx injured 4.5 mos ago    Subjective:   Paula Manning is a(n) 69 year old female who presents for follow up of left proximal humerus fracture that occurred approximately 7.5 months ago.  She is accompanied by her son who translates. The fx was treated nonoperatively due to patient's high surgical risk due to to multiple comorbidities. Patient is here today for followup she reports that she is improving with less pain. She reports having no pain unless undergoing PT. She receives pain medicine from her primary care physician. 6 out of ten pain. Intermittent, dull, worse with activity, less with rest. She is able to use her left arm for ADL's as well as washing her hair. Her son is requesting further PT as well as a letter that states her husband still has to help with cooking and cleaning.    Objective:   BP 127/73  Pulse 72  Temp(Src) 98.2 F (36.8 C) (Oral)     Physical Exam:  General Appearance: healthy, alert, no distress, pleasant affect, cooperative. WDWN  Nl gait  Inspection: Mild muscle asymmetry  skin intact; no swelling   Active assisted Range of motion tests:right to left:  PROM of shoulder FF-90deg, abd-90, ER-10.  AROM FF-80, Abd-60, ER - 10.  NVI  Mild ttp proximal humerus   Sensation:  Ulnar intact  Radial intact  Median intact  Axillary intact  Pulses:     radial 2 +  Motor int delt/bi/tri/we/wf/grip    XRays 3 views left shoulder were ordered and read by [place] in clinic today and demonstrate a healed displaced proximal humerus fracture with maintained alignment    Assessment/Plan:  1. Closed fracture of surgical neck of humerus (812.01)  CONSULT/REFERRAL TO PHYSICAL THERAPY-OUTSIDE (NON-[place]), X-RAY SHOULDER LEFT SIDE       Discussion the patient and son that xrays show a healed fx and pt is improving clinically.  Continue with physical therapy a new prescription for this was  done.  Discussed home exercise program  Pt did not request more pain medication  Letter written to describe limitations of cooking and cleaning    Follow-up in 3 mo(s). With x-rays     Patient Instruction:  See Patient Education section.      Barriers to Learning assessed: none. Patient verbalizes understanding of teaching and instructions.

## 2010-03-26 NOTE — Progress Notes (Signed)
 CC: left shoulder pain  Injury: left proximal humerus fracture  DOI: 08/11/09   MOI: mechanical fall     S:  69 yo F w/ hx of HTN, DM2, Cirrhosis, and Hep C, presents for f/u of left proximal humerus fx sustained 08/11/09, accompanied by son who helps translate. Initial fx was treated nonoperatively due to high surgical risk from an extensive medical history and multiple comorbidities. Today, pt reports she is doing well and that she feels that much has improved since her last office visit on 5/17. She reports moderate pain of 5/10, which is intermittent, dull, worse with activity, less with rest, and better than before. Denies any tingling, numbness, loss of sensation. TTP on medial arm. She receives pain medicine from primary care physician, but does not remember the name.  Pt states that PT has helped a lot and she is now able to cook a little and help with household chores. Pt requests for social worker to assist at home as well as a letter to continue PT. No other questions or concerns at this time.     PMH:   Past Medical History   Diagnosis Date   . Migraine Headache    . Glaucoma    . Ascites 06/29/2007     History of paracentesis x 2   . Pancreatic Cyst 06/29/2007   . Cirrhosis 06/24/2007     Active on the liver transplant waiting list with a blood type of O positive. 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 Osteopenia 06/24/2007   . Emphysema 06/24/2007   . EV (Esophageal Varices) 06/10/2007   . Anemia 06/10/2007   . HCV (Hepatitis C Virus) 06/10/2007     Achieved SVR.   Marland Kitchen DM Circ Dis Type II 06/10/2007   . HTN 06/10/2007   . HTN 06/10/2007       PSH:   Past Surgical History   Procedure Date   . Lap,cholecystectomy/explore 1970s     in [place];  open CCX   . Cesarean delivery only    . Revision of bladder/urethra      Bladder suspension       FH: No family history on file.    SOC:   History   Social History   . Marital Status: Married     Spouse Name: N/A     Number of Children: N/A    . Years of Education: N/A   Social History Main Topics   . Smoking status: Former Smoker -- 10 years   . Smokeless tobacco: Not on file   . Alcohol Use: No   . Drug Use: Not on file   . Sexually Active: Not on file   Other Topics Concern   . Not on file   Social History Narrative   . No narrative on file     Allergies:   Allergies as of 03/26/2010 - reviewed 03/26/2010   Allergen Reaction Noted   . Advil (ibuprofen micronized) Other 06/10/2007       Meds:  Current outpatient prescriptions ordered prior to encounter   Medication Sig Dispense Refill   . glipiZIDE (GLUCOTROL) 10 MG tablet Take 1 tablet by mouth 2 times daily.  60 tablet  3   . lisinopril (PRINIVIL, ZESTRIL) 2.5 MG tablet Take 1 Tab by mouth daily.  30 Tab  2   . insulin glargine (LANTUS) 100 UNIT/ML injection Inject 10 Units under the skin nightly.  1 Vial  2   .  furosemide (LASIX) 40 MG tablet Take 1 Tab by mouth daily.  60 Tab  11   . insulin aspart (NOVOLOG) 100 UNIT/ML injection Inject 5 Units under the skin. Only give it before dinner.  10 mL  3   . spironolactone (ALDACTONE) 25 MG tablet Take 2 Tabs by mouth daily.       Marland Kitchen alendronate (FOSAMAX) 70 MG tablet Take 1 Tab by mouth every 7 days.  4 Tab  11   . VITAMIN D 400 UNIT OR CAPS 1 CAPSULE DAILY  30  11   . CALCIUM CARBONATE 600 MG OR TABS 1 tablet 2 times daily  one month  11       O:   BP 127/73  Pulse 72  Temp(Src) 98.2 F (36.8 C) (Oral)  PEx  GEN: AOx3, WDWN, NAD, resting comfortably  PULM: breathing nonlabored  RUE  SKIN: no swelling, ecchymosis, erythema, open wounds, or signs of infection  MSK: PROM of shoulder FF-100, abd-90, ER-10. AROM FF-90, Abd-50, ER - 0. Internal rotation almost at PSIS. Mild ttp at medial humerus  NEURO: SILT ulnar/radial/median/axillary, 5/5 delt/bi/tri/we/wf/grip   VASC: 2+ radial pulse    IMAGING: X Rays 4 views of left shoulder were ordered and ready by [place] in clinic today, demonstrate healing of displaced proximal humerus fx with maintained alignment.  Also show degenerative changes of glenohumeral and acromioclavicular joint.     A/P:  69 yo F s/p well-healing closed L proximal humeral head fx treated non-operatively due to multiple comorbidities.   1. Continue daily PT, especially pulley exercises, recommended twice daily  2. Letter for social worker to assist at home in cooking, cleaning, getting dressed, etc.  3. Letter for continuation of PT  4. F/u in 3 months

## 2010-04-09 ENCOUNTER — Other Ambulatory Visit (HOSPITAL_BASED_OUTPATIENT_CLINIC_OR_DEPARTMENT_OTHER): Payer: Self-pay | Admitting: "Endocrinology

## 2010-04-09 MED ORDER — GLIPIZIDE 10 MG OR TABS
10.0000 mg | ORAL_TABLET | Freq: Two times a day (BID) | ORAL | Status: DC
Start: 2010-04-09 — End: 2014-07-04

## 2010-04-09 NOTE — Telephone Encounter (Signed)
 LEFT MESSAGE FOR PATIENT TO RETURN CALL

## 2010-04-09 NOTE — Telephone Encounter (Signed)
 I have refilled her glipizide. She needs to follow up with endo if she wants to get meds refilled by [place] in the future. Last A1C 04/23/2009 at 6.9. Needs to go to lab to have repeat A1C done. I ordered this and other labs.     If she doesn't want follow up with [place], she needs to see PCP and have future refills done by PCP.     Lab Results   Component Value Date    A1C 6.9 04/23/2009    A1C 7.4 12/06/2008    A1C 8.2 08/18/2008

## 2010-04-09 NOTE — Telephone Encounter (Signed)
 PATIENT LAST SEEN BY DR.HU 05/23/09. NO FUTURE APPTS.

## 2010-04-11 NOTE — Telephone Encounter (Signed)
 Message relayed to patient and she will go and get her labs done first than call to schedule appt.

## 2010-05-11 DEATH — deceased

## 2010-05-23 NOTE — Procedures (Signed)
 Report Author: Leone Payor, M.D.    Date of Operation: 11/01/2008    Endoscopist: Leone Payor    GI Fellow: None    Referring Physician:    PROCEDURE PERFORMED: COLONOSCOPY    INDICATIONS FOR EXAMINATION: 69 year old female  with hepatitis C cirrhosis who had incomplete colonoscopy  last year and is here now for repeat screening  colonoscopy.. The patient was explained the risks, benefits,  and alternatives, and signed consent was obtained.    INSTRUMENTS:    MEDICATIONS: Demerol 25 mg IVP plus already given  during upper endoscopy    PROCEDURE TECHNIQUE: A physical exam was  performed. Informed consent was obtained from the patient  after explaining all the risks (perforation, bleeding, infection  and adverse effects to the medicine) , benefits and  alternatives to the procedure which the patient appeared to  understand and so stated. The patient was connected to  the monitoring devices and placed in the left lateral position.  A digital exam was performed and the colonoscope  introduced in to the rectum and advanced under direct  visualization to the Cecum.. The scope was subsequently  removed slowly while carefully examining the color, texture,  anatomy, and integrity of the mucosa on the way out. In the  rectum, the scope was retroflexed to evaluate for internal  hemorrhoids and anorectal pathology. The patient was  subsequently transferred to the recovery area in satisfactory  condition.      ESTIMATED BLOOD LOSS: None. SPECIMEN:No  Biopsies Taken    Findings: 1) Fair prep to the cecum  2) External hemorrhoids  3) Rectal Varices    Endoscopic Diagnosis: 1) External Hemorrhoids  2) Rectal Varices    Recommendations: Follow up in hepatology clinic.              Electronically signed by:  Leone Payor, M.D. 11/08/2008 11:12 A          DD: 11/01/2008 DT: 11/01/2008 09:00 A DocNo.: [id number]  AK/bsm    Referring Physician:  Rene Paci M.D.  [facility] [address]  [place] [address]    Primary Care  Physician:  Rene Paci M.D.  [address] [place], [place] [address]    cc:

## 2010-05-23 NOTE — Lab (Signed)
 FINAL PATHOLOGIC DIAGNOSIS:  A: Duodenal bulb and antrum, biopsies   -Benign duodenal and antral-type mucosa, see comment.  COMMENT: Sections show duodenal mucosa with minimal villous blunting, but  no increase in intraepithelial lymphocytes. No H. pylori organisms are  identified.  SPECIMEN(S) SUBMITTED: A: Duodenal bulb and antrum biopsy  CLINICAL HISTORY: HCV cirrhosis. EGD - duodenal bulb with antral  biopsies.  GROSS DESCRIPTION: A: The specimen (received in formalin, labeled with the  patient's name, medical record number, and "1, duodenal bulb antrum  biopsy") consists of three pink-tan soft tissue fragments ranging in size  from 0.4 x 0.2 x 0.2 cm to 0.8 x 0.2 x 0.2 cm. The specimen is entirely  submitted in cassette A1.  ES/BD/kc  CONFIDENTIAL HEALTH INFORMATION: Health Care information is personal and  sensitive information. If it is being faxed to you it is done so under  appropriate authorization from the patient or under circumstances that do  not require patient authorization. You, the recipient, are obligated to  maintain it in a safe, secure and confidential manner. Re-disclosure  without additional patient consent or as permitted by law is prohibited.  Unauthorized re-disclosure or failure to maintain confidentiality could  subject you to penalties described in federal and state law. If you have  received this report or facsimile in error, please notify the [facility]  [facility] Department immediately and destroy the received document(s).   Material reviewed and Interpreted and  Report Electronically Signed by:  Gaylord Kearns M.D. [phone])  Attending Surgical Pathologist  01/31/08 11:37  Electronic Signature derived from a single  controlled access password

## 2010-05-23 NOTE — Procedures (Signed)
 Report Author: Darci East, M.D.    Date of Operation: 10/08/2007    Endoscopist Gretchen Weinfeld  GI Fellow None  Referring Physician    PROCEDURE PERFORMED  COLONOSCOPY    INDICATIONS FOR EXAMINATION  Colorectal cancer screening. Average risk. The  patient was explained the risks, benefits, and  alternatives, and signed consent was obtained.    INSTRUMENTS: 223  MEDICATIONS: Fentanyl  75 mcg IVP, Versed  4  mg IVP    The attending physician, Dr. Sharon December, was present for  the entire examination.    PROCEDURE TECHNIQUE : A physical exam was  performed. Informed consent was obtained from the  patient after explaining all the risks (perforation,  bleeding, infection and adverse effects to the  medicine) , benefits and alternatives to the  procedure which the patient appeared to understand  and so stated. The patient was connected to the  monitoring devices and placed in the left lateral  position. A digital exam was performed and the  colonoscope introduced in to the rectum and  advanced under direct visualization to the Cecum.  The scope was subsequently removed slowly while  carefully examining the color, texture, anatomy, and  integrity of the mucosa on the way out. In the  rectum, the scope was retroflexed to evaluate for  internal hemorrhoids and anorectal pathology. The  patient was subsequently transferred to the  recovery area in satisfactory condition.    ESTIMATED BLOOD LOSS: None. SPECIMEN:  None    FINDINGS  1) Colonoscope advanced to the proximal ascending  colon just outside the cecum. Unable to advance  futher due to looping and patient discomfort.  2) Examined mucosa was normal.    ENDOSCOPIC DIAGNOSIS  1) Colonoscope advanced to the proximal ascending  colon just outside the cecum. Unable to advance  futher due to looping and patient discomfort.  2) Examined mucosa was normal.    RECOMMENDATIONS  Repeat colonoscopy 1 year.              Signature Derived From Controlled Access Password  Darci East, M.D.  10/08/2007 11:34 A          DD: 10/08/2007 DT: 10/08/2007 10:30 A DocNo.: [id number]  AK/bsm    Referring Physician:  Maia Schwartz M.D.  [address]  [place] [address]    Primary Care Physician:  Maia Schwartz M.D.  [address]  [place], [place] [address]    cc:

## 2010-05-23 NOTE — Procedures (Signed)
 Report Author: Darci East, M.D.    Staff Physician:    Date of Operation: 04/12/2008    PATIENT NO SHOW FOR COLONOSCOPY        Electronically signed by:  Darci East, M.D. 04/26/2008 09:12 A          DD: 04/25/2008 DT: 04/25/2008 10:34 A DocNo.: [id number]  AK/mgv    Referring Physician:  Darci East MD  [place], [address]    Primary Care Physician:  Maia Schwartz M.D.  [address] [place], [place] [address]    cc:

## 2010-05-23 NOTE — Procedures (Signed)
Report Author: Selinda Orion, M.D.    Date of Operation: 12/03/2005    Endoscopist Mar Daring  GI Fellow None  Referring Physician    PROCEDURE PERFORMED      INDICATIONS FOR EXAMINATION  . The patient was explained the risks, benefits, and  alternatives, and signed consent was obtained.    INSTRUMENTS: [id number]]  MEDICATIONS: None    The attending physician, Dr. Sonda Rumble, was  present for the entire examination.    PROCEDURE TECHNIQUE :    FINDINGS      ENDOSCOPIC DIAGNOSIS      RECOMMENDATIONS                Signature Derived From Controlled Access Password  Selinda Orion, M.D. 12/12/2005 14:28          DD: 12/03/2005 DT: 12/03/2005 8:02 A DocNo.: [id number]  TIH/BSM      Primary Care Physician:  Rene Paci M.D.  [address] [place], [place] [address]    cc:

## 2010-05-23 NOTE — Procedures (Signed)
Report Author: Selinda Orion, M.D.    Date of Operation: 10/28/2006    Endoscopist Mar Daring  GI Fellow Dorothey Baseman  Referring Physician    PROCEDURE PERFORMED  EGD    INDICATIONS FOR EXAMINATION  ESLD. H/O Banding for primary prophylasis 3  years ago. Repeat EGD for screening The patient  was explained the risks, benefits, and alternatives,  and signed consent was obtained.    INSTRUMENTS: [id number] [[id number]]  MEDICATIONS: Versed 3 mg IVP    The attending physician, Dr. Sonda Rumble, was  present for the entire examination.    PROCEDURE TECHNIQUE : A physical exam was  performed. Informed consent was obtained from the  patient after explaining all the risks (perforation,  bleeding, infection and adverse effects to the  medicine) , benefits and alternatives to the  procedure which the patient appeared to understand  and so stated. The patient was connected to the  monitoring devices and placed in the left lateral  position. Continuous oxygen was provided with a  nasal cannula and IV medicine administered  through a indwelling cannula. After adequate  conscious sedation was achieved, the patient was  intubated and the scope advanced under direct  visualization to the Second Part of Duodenum. The  scope was subsequently removed slowly while  carefully examining the color, texture, anatomy, and  integrity of the mucosa on the way out. The patient  was subsequently transferred to the recovery area  in satisfactory condition.    ESTIMATED BLOOD LOSS: None  . SPECIMEN: Specimen(s) sent to Pathology      FINDINGS  Grade 1 esophageal varices. Hiatal hernia with  regular z-line. few gastric erosions without ulcer  scattered in the body. Duodenal mucasa appears flat.  This was biopsied.    ENDOSCOPIC DIAGNOSIS  Grade 1 esophageal varices.  Hiatal hernia with regular z-line.  Gastric erosions  Duodenal mucasa appears flat, s/p biopsied.    RECOMMENDATIONS  F/U biopsy  Repeat EGD in 1 year  Protonix 40mg  daily for 6  weeks.              Signature Derived From Controlled Access Password  Selinda Orion, M.D. 11/02/2006 10:21 A          DD: 10/28/2006 DT: 10/28/2006 08:37 A DocNo.: [id number]  TIH/bsm    Referring Physician:  Leone Payor MD  [place] [address]    Primary Care Physician:  Rene Paci M.D.  [address] [place], [place] [address]    cc:

## 2010-05-23 NOTE — Lab (Signed)
FINAL PATHOLOGIC DIAGNOSIS:  A: Duodenum, biopsy   -Benign duodenal mucosa with no significant histopathology.  SPECIMEN(S) SUBMITTED: A: duodenal biopsy  CLINICAL HISTORY: Flat mucosa in duodenum.  GROSS DESCRIPTION: A: The specimen (received in formalin, labeled with the  patient's name, medical record number and "duodenal bx") consists of two  pink-tan fragments of soft tissue measuring 0.3 x 0.3 x 0.1 cm to 0.6 x 0.2  x 0.1 cm. The specimen is submitted entirely in cassette A1.  AH/ZP/jb  CONFIDENTIAL HEALTH INFORMATION: Health Care information is personal and  sensitive information. If it is being faxed to you it is done so under  appropriate authorization from the patient or under circumstances that do  not require patient authorization. You, the recipient, are obligated to  maintain it in a safe, secure and confidential manner. Re-disclosure  without additional patient consent or as permitted by law is prohibited.  Unauthorized re-disclosure or failure to maintain confidentiality could  subject you to penalties described in federal and state law. If you have  received this report or facsimile in error, please notify the [facility]  [facility] Department immediately and destroy the received document(s).   Material reviewed and Interpreted and  Report Electronically Signed by:  Patterson Hammersmith M.D. [phone])  Attending Surgical Pathologist  10/30/06 09:09  Electronic Signature derived from a single  controlled access password

## 2010-05-23 NOTE — Procedures (Signed)
 Report Author: Darci East, M.D.    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 The patient was  explained the risks, benefits, and alternatives, and  signed consent was obtained.    INSTRUMENTS: 899  MEDICATIONS: Fentanyl  25 mcg IVP, Versed  2  mg IVP    The attending physician, Dr. Sharon December, was present for  the entire examination.    PROCEDURE TECHNIQUE : A physical exam was  performed. Informed consent was obtained from the  patient after explaining all the risks (perforation,  bleeding, infection and adverse effects to the  medicine) , benefits and alternatives to the  procedure which the patient appeared to understand  and so stated. The patient was connected to the  monitoring devices and placed in the left lateral  position. Continuous oxygen was provided with a  nasal cannula and IV medicine administered  through a indwelling cannula. After adequate  conscious sedation was achieved, the patient was  intubated and the scope advanced under direct  visualization to the Second Part of Duodenum. The  scope was subsequently removed slowly while  carefully examining the color, texture, anatomy, and  integrity of the mucosa on the way out. The patient  was subsequently transferred to the recovery area  in satisfactory condition.    ESTIMATED BLOOD LOSS: Trace. SPECIMEN:  Duodenal bulb and antrum biopsies to r/o H. pylori.    FINDINGS  1) No esophageal varices.  2) No gastric varices.  3) Mild portal hypertenisve gastropathy.  4) Duodenal bulb ulcerations, clean based. Biopsies  taken.    ENDOSCOPIC DIAGNOSIS  1) No esophageal varices.  2) No gastric varices.  3) Mild portal hypertenisve gastropathy.  4) Duodenal bulb ulcerations, clean based.    RECOMMENDATIONS  1) Follow-up pathology. If H. pylori found, then  eradicate.  2) Start Prevacid  30 mg po  daily.              Electronically signed by:  Darci East, M.D. 01/30/2008 09:29 P          DD: 01/28/2008 DT: 01/28/2008 11:00 A DocNo.: [id number]  AK/bsm      Primary Care Physician:  Maia Schwartz M.D.  [address] [place], [place] [address]    cc:

## 2010-05-23 NOTE — Procedures (Signed)
 Report Author: Charolotte Copp, M.D.    Staff Physician: Joselyn Nicely. Orion Birks, M.D.    Date of Operation: 08/03/2007    Endoscopist Babs Bolster  GI Fellow Crucita Lacorte  Referring Physician Darci East    PROCEDURE PERFORMED  EGD & EUS - Upper    INDICATIONS FOR EXAMINATION  69 year old Paula Manning with HCV, varices and ascites  found to have pancreatic cysts on CT. These have  not changed over the past few years on CT. This  exam to further evaluate the cysts further. The  patient was explained the risks, benefits, and  alternatives, and signed consent was obtained.    INSTRUMENTS: 879/060  MEDICATIONS: Demerol  75 mg IVP, Versed  3  mg IVP    The attending physician, Dr. Orion Birks, was present  for the entire examination.    PROCEDURE TECHNIQUE : A physical exam was  performed. Informed consent was obtained from the  patient after explaining all the risks (perforation,  bleeding, infection and adverse effects to the  medicine) , benefits and alternatives to the  procedure which the patient appeared to understand  and so stated. The patient was connected to the  monitoring devices and placed in the left lateral  position. Continuous oxygen was provided with a  nasal cannula and IV medicine administered  through a indwelling cannula. After adequate  conscious sedation was achieved, the patient was  intubated and the scope advanced under direct  visualization to the Second Part of Duodenum. The  scope was subsequently removed slowly while  carefully examining the color, texture, anatomy, and  integrity of the mucosa on the way out. The patient  was subsequently transferred to the recovery area  in satisfactory condition.    ESTIMATED BLOOD LOSS: None. SPECIMEN: No  Biopsies Taken    FINDINGS  Upper endoscopy revealed trace esophageal varices  and no gastric varices. Otherwise normal upper  endoscopy including the stomach and duodenum to  the second portion. Olympus electronic radial  array EUS revealed a pancreas with  multiple  anechoic cysts mostly located in the pancreatic  body. The cysts seemed to abut the pancreatic duct.  The largest cyst measured 20 mm x 14 mm in the  body of the pancreas. No masses were seen in the  pancreas or associated with these cysts. The  pancreatic duct measured 3.5 mm in the body of the  pancreas and did not clearly communicate with  these cysts. The peri-pancreatic blood vessels  appeared normal and no peri-pancreatic lymph  nodes were seen. The gallbladder was not seen.  The bile duct measured 7 mm. Endoscopically the  ampulla appeared normal. The peri-pancreatic  blood vessels were normal exept for a prominent  portal vein/SMV confluence.    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 The largest cyst measured 20 mm  x 14 mm. No associated masses were seen to  suggest malignancy. The ampulla appeared normal.  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 Less  likely this would be multiple mucinous cyst  adenomas.  2) Trace esophageal varices.  3) No gallbladder.    RECOMMENDATIONS  1. Consider pancreatic MRI/MRCP in 6 months to  follow these pancreatic cysts for interval change.  2. Further recommendations as per Dr. Sharon December.                Signature Derived From Controlled Access Password  Charolotte Copp, M.D. 08/04/2007 09:26 A  Signature Derived From Controlled Access Password  Joselyn Nicely. Orion Birks, M.D. 08/06/2007 08:21 P    DD: 08/03/2007 DT: 08/03/2007 08:00 A DocNo.: [id number]  DJO/bsm        cc:

## 2010-05-23 NOTE — Procedures (Addendum)
 Patient Height: 156.0000 cm Patient Weight: 66.0000 kg  Paula Manning, M.D., Director Ph. [phone]    Pred Baseline %Pred Post %Pred %Chg   FVC [L] 2.64 2.16 82   FEV 1 [L] 2.07 1.32 64   FEV 1 / FVC [%] 78.4 61.2 78   FEF 25/75 [L/s] 2.05 0.55 27   PEF [L/s] 5.49 3.30 60   MEF 50 [L/s] 3.33 0.75 22   MEF 25 [L/s] 1.10 0.16 14   FIF 50 [L/s] 3.23   PIF [L/s] 3.71 2.69 72   VC [L] 2.64 2.16 82   FRC-PL [L] 2.64 3.49 132   RV [L] 1.98 2.93 148   TLC [L] 4.67 5.09 109   RV / TLC [%] 41.8 57.5 138   R 0.5 IN [cmH2O*s/L] 3.06 6.09 199   DLCO SB [ml/min/mmHg] 22.7   DLCOc [ml/min/mmHg] 22.7   TLC-SB [L] 4.67   Hb [g/152ml]   PI MAX [cmH20] 72 38 53   PE MAX [cmH20] 138 60 43  Technical comments: All tests and data acceptable and]repeatable. Patient  gave great effort and cooperation.]Previous [facility] report available. ABG  at rest on room air:]pH= 7.45 , PCO2= 36 , PO2= 86 .] ]Interpretation:]  ]1. FEV1 and FEV1/FVC ratio are decreased. Decreased]mid-expiratory flow  rates, corresponding to a hyperbolic]shape of the expiratory portion of  the flow-volume loop.]These findings are consistent with a moderate  obstructive]defect in the small airways.] ]2. Increased lung volumes  measured by plethysmography, with]a pattern that confirms the presence of  obstruction: the]Residual Volume is relatively increased more than  the]Functional Residual Capacity, which is in turn relatively]increased  more than the Total Lung Capacity.] ]3. The airways resistance during  quiet breathing, at a lung]volume near [facility] is borderline high.] ]4. The  Maximum Inspiratory Pressures and Maximum Expiratory]Pressures generated  by the patient are within normal]limits.] ]5. The arterial blood gas,  measured while the patient was]breathing an FiO2 of 0.21, disclosed an  alveolar-arterial]pO2 gradient of 21 mmHg, which is 113 percent of  predicted.]The pH is normal and the pCO2 is normal.] ]Paula Wilhemina Bonito,  MD FACCP]

## 2010-05-23 NOTE — Lab (Signed)
 FINAL PATHOLOGIC DIAGNOSIS:  A: Small intestine, duodenum, biopsy   -Focal mucosal erosions with acute inflammation, of duodenal mucosa.  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 Screening  EGD showed white plaques in the duodenum. No ulcers or lesions.  GROSS DESCRIPTION: A: The specimen (received in formalin, labeled with the  patient's name, medical record number and "duodenum") consists of three  pink-tan fragments of soft tissue ranging from 0.3 x 0.2 x 0.2 cm to 0.5 x  0.2 x 0.1 cm. The specimen is entirely submitted in cassette A1.  ES/DL/jb  CONFIDENTIAL HEALTH INFORMATION: Health Care information is personal and  sensitive information. If it is being faxed to you it is done so under  appropriate authorization from the patient or under circumstances that do  not require patient authorization. You, the recipient, are obligated to  maintain it in a safe, secure and confidential manner. Re-disclosure  without additional patient consent or as permitted by law is prohibited.  Unauthorized re-disclosure or failure to maintain confidentiality could  subject you to penalties described in federal and state law. If you have  received this report or facsimile in error, please notify the [facility]  [facility] Department immediately and destroy the received document(s).   Material reviewed and Interpreted and  Report Electronically Signed by:  Ruffin Frederick. Ponsford Tipps M.D. [phone])  Attending Surgical Pathologist  11/03/08 10:20  Electronic Signature derived from a single  controlled access password

## 2010-05-23 NOTE — Procedures (Signed)
 Report Author: Len Childs. Milderd Meager, M.D.    Staff Physician: Leone Payor, M.D.    Date of Operation: 11/01/2008    Endoscopist: Leone Payor    GI Fellow: Henrene Pastor    Referring Physician:    PROCEDURE PERFORMED: EGD - biopsy    INDICATIONS FOR EXAMINATION: 69 YEAR old female  with history of hepatitis cirrhosis complicated by ascites and  esophageal varices. Patient on propranolol and is here now  for esophageal variceal screening.. The patient was  explained the risks, benefits, and alternatives, and signed  consent was obtained.    INSTRUMENTS: 572    MEDICATIONS: Demerol 75 mg IVP, Versed 3 mg IVP    PROCEDURE TECHNIQUE: A physical exam was  performed. Informed consent was obtained from the patient  after explaining all the risks (perforation, bleeding, infection  and adverse effects to the medicine) , benefits and  alternatives to the procedure which the patient appeared to  understand and so stated. The patient was connected to  the monitoring devices and placed in the left lateral position.  Continuous oxygen was provided with a nasal cannula and  IV medicine administered through a indwelling cannula.  After adequate conscious sedation was achieved, the  patient was intubated and the scope advanced under direct  visualization to the Second part of duodenum.. The scope  was subsequently removed slowly while carefully examining  the color, texture, anatomy, and integrity of the mucosa on  the way out. The patient was subsequently transferred to  the recovery area in satisfactory condition.      ESTIMATED BLOOD LOSS: None. SPECIMEN:Specimen  (s) sent to Pathology    Findings: 1) No esophageal varices seen  2) Portal hypertensive gastropathy with mild oozing at the  gastroesophageal juntion  3) No gastric varix  4) Duodenum with multiple areas of superficial white  plaques with multiple biopsies taken    Endoscopic Diagnosis: 1) No esophageal or gastric varices  visualized.  2) Portal hypertensive  gastropathy with mild oozing at the  gastroesophageal junction secondary to scope trauma.  3) White plaques in the duodenum that were biopsied.    Recommendations: 1) Continue propranolol twice daily  2) Follow up in hepatology  3) Follow up on pathology from biopsy              Reviewed & Electronically Signed by:  Len Childs. Milderd Meager, M.D. 11/01/2008 11:48 A  Electronically signed by:  Leone Payor, M.D. 11/08/2008 11:11 A    DD: 11/01/2008 DT: 11/01/2008 09:00 A DocNo.: [id number]  MHF/bsm    Referring Physician:  Rene Paci M.D.  [facility]  [place] [address]    Primary Care Physician:  Rene Paci M.D.  [address] [place], [place] [address]    cc:

## 2010-06-25 ENCOUNTER — Ambulatory Visit (HOSPITAL_BASED_OUTPATIENT_CLINIC_OR_DEPARTMENT_OTHER): Admitting: Orthopaedic Surgery

## 2010-06-25 VITALS — BP 186/73 | HR 65 | Temp 98.1°F

## 2010-06-25 NOTE — Progress Notes (Signed)
ORTHOPAEDIC FOLLOW UP  [facility] DEPARTMENT OF ORTHOPAEDIC SURGERY    DOI: 08/11/09, L proximal humerus fx    Medical Record #: [id number]  DOB: [birth year]    Primary Care Physician:  Sherrie Mustache., MD    Reason for visit: Follow up left shoulder fx    Translator needed:  Yes; Byrd Hesselbach was present during entire history and exam and plan    History of present Illness:    Paula Manning is a 69 year old RHD female who presents for follow up of left proximal humerus fracture. The fx was treated nonoperatively due to patient's high surgical risk due to to multiple comorbidities (given 10% risk of death if surgical treatment had been performed). Patient is here today for followup. She reports that she is improving with less pain, and does not take pain medications.  She is able to do everything that she wants to do but does have a bit of pain when she mops the floor. She rates her pain as 0 today.    Physical Exam:  BP 186/73  Pulse 65  Temp(Src) 98.1 F (36.7 C) (Oral)   Alert and oriented x3, WDWN, NAD.    Nl gait  Mood and affect appropriate.  No cyanosis, clubbing or edema.  Skin color, texture and turgor are normal. No rashes or lesions.  No gross sensory and motor abnormalities. R/u/m/ax intact  Vascular: Strong distal pulses.  Lymph: No lymphadenopathy  ROM ff90, abd 90, er 15, ir l2  No focal ttp    XRAYS were ordered and read by [place] in clinic today and are consistent with a healed proximal humerus fracture in continued varus alignment, no change in alignment.    Assessment: 69 yr old RHD female 10 months s/p prox humerus fx, tx closed due to high risk of operative mortality due to liver disease    Plan:  Continued nonop treatment, no further xrays needed, PT reordered for 10w per pt's request. Cont HEP.     Follow-up:  PRN

## 2010-06-26 NOTE — Progress Notes (Signed)
See resident's note for details.  I saw and evaluated the patient.  I have discussed the patient with the resident and agree with resident's findings and the plan we developed as written.

## 2010-07-01 ENCOUNTER — Other Ambulatory Visit (HOSPITAL_BASED_OUTPATIENT_CLINIC_OR_DEPARTMENT_OTHER): Payer: Self-pay

## 2010-07-01 NOTE — Telephone Encounter (Signed)
69 yo F w/ HCV cirrhosis (achieved SVR), last seen by Dr. Lissa Merlin 01/16/10.  Requesting refill of Lasix 40 mg daily.  Last labs from 01/08/10 creat 0.8, K 4.4.  Will pend refill of lasix and spironolactone for refill.  Called pt to notify her that she needs to have updated labs (CMP from endo MD already ordered in Epic), but she is Spanish speaking only.    Will route to NP to advise if one-time fill is appropriate until pt has labs.  Routine labs pended for signature (CMP, CBC, direct bili, INR, AFP).  Will route to admin assistant to please contact pt re: obtaining labs once ordered.

## 2010-07-02 MED ORDER — SPIRONOLACTONE 25 MG OR TABS
50.0000 mg | ORAL_TABLET | Freq: Every day | ORAL | Status: DC
Start: 2010-07-01 — End: 2010-12-28

## 2010-07-02 MED ORDER — FUROSEMIDE 40 MG OR TABS
40.0000 mg | ORAL_TABLET | Freq: Every day | ORAL | Status: DC
Start: 2010-07-01 — End: 2011-10-17

## 2010-07-02 NOTE — Telephone Encounter (Signed)
One time refill authorized.  Patient needs to have labs completed.     Admin to contact patient and advise regarding plan.

## 2010-07-02 NOTE — Telephone Encounter (Signed)
Patient informed of medication refill and need for labs. Patient will come in for blood work next week.

## 2010-08-14 ENCOUNTER — Other Ambulatory Visit (HOSPITAL_BASED_OUTPATIENT_CLINIC_OR_DEPARTMENT_OTHER): Payer: Self-pay | Admitting: Family

## 2010-08-14 LAB — COMPREHENSIVE METABOLIC PANEL, BLOOD
ALT (SGPT): 35 U/L — ABNORMAL HIGH (ref 0–33)
AST (SGOT): 31 U/L (ref 0–32)
Albumin: 3.4 g/dL — ABNORMAL LOW (ref 3.5–5.2)
Alkaline Phos: 124 U/L (ref 35–140)
BUN: 12 mg/dL (ref 6–20)
Bicarbonate: 31 mmol/L — ABNORMAL HIGH (ref 22–29)
Bilirubin, Tot: 1.2 mg/dL — ABNORMAL HIGH (ref <1.2)
Calcium: 8.9 mg/dL (ref 8.6–10.5)
Chloride: 106 mmol/L (ref 98–107)
Creatinine: 0.75 mg/dL (ref 0.51–0.95)
Glucose: 104 mg/dL (ref 70–115)
Potassium: 3.7 mmol/L (ref 3.5–5.1)
Sodium: 142 mmol/L (ref 136–145)
Total Protein: 7.2 g/dL (ref 6.0–8.0)

## 2010-08-14 LAB — CBC WITH DIFF, BLOOD
Abs Basophils: 0 10*3/uL (ref 0.0–0.1)
Abs Eosinophils: 0.1 10*3/uL (ref 0.0–0.5)
Abs Lymphs: 0.7 10*3/uL — ABNORMAL LOW (ref 0.8–3.1)
Abs Monos: 0.3 10*3/uL (ref 0.2–0.8)
Absolute Neutrophil Count: 2.1 10*3/uL (ref 1.6–7.0)
Basophils: 0 % (ref 0–2)
Eosinophils: 2 % (ref 1–7)
Hct: 36.8 % (ref 34.0–45.0)
Hgb: 13 g/dL (ref 11.2–15.7)
Lymphocytes: 22 % (ref 19–53)
MCH: 32.2 pg — ABNORMAL HIGH (ref 26.0–32.0)
MCHC: 35.3 % (ref 32.0–36.0)
MCV: 91.1 um3 (ref 79.0–95.0)
MPV: 11.1 fL (ref 9.4–12.4)
Monocytes: 9 % (ref 5–12)
Plt Count: 46 10*3/uL — ABNORMAL LOW (ref 140–370)
RBC: 4.04 10*6/uL (ref 3.90–5.20)
RDW: 14.2 % — ABNORMAL HIGH (ref 12.0–14.0)
Segs: 66 % (ref 34–71)
WBC: 3.2 10*3/uL — ABNORMAL LOW (ref 4.0–10.0)

## 2010-08-14 LAB — PROTHROMBIN TIME, BLOOD
INR: 1.5
PT,Patient: 16 s — ABNORMAL HIGH (ref 9.7–12.5)

## 2010-08-14 LAB — ALPHA FETOPROTEIN, BLOOD: AFP: 1.5 ng/mL (ref 0.0–8.3)

## 2010-08-14 LAB — BILIRUBIN, DIR BLOOD: Bilirubin, Dir: 0.3 mg/dL — ABNORMAL HIGH (ref <0.2)

## 2010-08-14 LAB — GLYCOSYLATED HGB(A1C), BLOOD: Glyco Hgb (A1C): 7.3 % — ABNORMAL HIGH (ref 4.8–5.9)

## 2010-08-14 LAB — GFR: GFR: >60 mL/min

## 2010-08-19 ENCOUNTER — Other Ambulatory Visit (HOSPITAL_BASED_OUTPATIENT_CLINIC_OR_DEPARTMENT_OTHER): Payer: Self-pay | Admitting: Gastroenterology

## 2010-08-22 ENCOUNTER — Ambulatory Visit (INDEPENDENT_AMBULATORY_CARE_PROVIDER_SITE_OTHER): Admitting: Gastroenterology

## 2010-08-22 ENCOUNTER — Encounter (INDEPENDENT_AMBULATORY_CARE_PROVIDER_SITE_OTHER): Payer: Self-pay | Admitting: Gastroenterology

## 2010-08-22 NOTE — Interdisciplinary (Signed)
Pt given after visit summary instructions via a relative who translated for patient. Pt verbalized understanding.

## 2010-08-22 NOTE — Patient Instructions (Signed)
1) Schedule MRI now.  2) Repeat EGD in May 2012.  3) Return in July 2012.

## 2010-08-22 NOTE — Progress Notes (Signed)
[facility]PATOLOG[facility]  ATTENDING: Leone Payor, M.D.  DATE OF SERVICE: 08/22/2010  REFERRING PROVIDER: Sherrie Mustache  PRIMARY CARE PROVIDER: Sherrie Mustache., MD    REASON FOR VISIT:    Patient Active Problem List   Diagnoses Date Noted   . Proximal humerus fracture DOI: 08/11/08 [place]  08/14/2009   . Hypertension 12/12/2008   . Hyperlipidemia 12/12/2008   . Microalbuminuria 12/12/2008   . Osteoporosis 07/26/2007   . Ascites 06/29/2007     History of paracentesis x 2     . 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 Cirrhosis 06/24/2007     Removed from liver transplant waiting list due to low MELD and low likelihood of requiring a transplant in the near future.  Blood type of O positive.  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 Emphysema 06/24/2007   . Anemia 06/10/2007   . EV (esophageal varices) 06/10/2007   . Seizure Disorder 06/10/2007   . DM circ dis type II 06/10/2007   . HCV (Hepatitis C Virus) 06/10/2007     H/o GT2 but has achieved SVR.       Marland Kitchen HTN 06/10/2007       INTERVAL HISTORY:  The patient was last seen in this clinic in June 2011. In the interim the patient has not required hospitalization or emergency care. She feels well. No decompensation.    REVIEW OF SYSTEMS:    Confusion: No  Abdominal distention: No  Melena: No  Hematemesis: No  Hematochezia: No  All other systems negative.    Current Outpatient Prescriptions   Medication Sig Dispense Refill   . glipiZIDE (GLUCOTROL) 10 MG tablet Take 1 tablet by mouth 2 times daily.  60 tablet  3   . insulin glargine (LANTUS) 100 UNIT/ML injection Inject 10 Units under the skin nightly.  1 Vial  2   . alendronate (FOSAMAX) 70 MG tablet Take 1 Tab by mouth every 7 days.  4 Tab  11   . VITAMIN D 400 UNIT OR CAPS 1 CAPSULE DAILY  30  11   . CALCIUM CARBONATE 600 MG OR TABS 1 tablet 2 times daily  one month  11   .  furosemide (LASIX) 40 MG tablet Take 1 tablet by mouth daily.  60 tablet  0   . spironolactone (ALDACTONE) 25 MG tablet Take 2 tablets by mouth daily.  60 tablet  0   . DISCONTD: lisinopril (PRINIVIL, ZESTRIL) 2.5 MG tablet Take 1 Tab by mouth daily.  30 Tab  2   . DISCONTD: insulin aspart (NOVOLOG) 100 UNIT/ML injection Inject 5 Units under the skin. Only give it before dinner.  10 mL  3       ALLERGIES:   Allergies   Allergen Reactions   . Advil (Ibuprofen Micronized) Other     Intolerance           PHYSICAL EXAM:  BP 122/51  Pulse 68  Temp(Src) 98.1 F (36.7 C) (Oral)  Resp 16  Ht 5\' 4"  (1.626 m)  Wt 61.236 kg (135 lb)  BMI 23.17 kg/m2  General:  Alert and oriented x 3, conversant, pleasant and appropriate.  ENT: Oropharynx is clear, mucus membranes are moist.  Pulmonary:  Clear to auscultation bilaterally.  Cardiovascular: Regular rate and rhythm. No murmurs, rubs,  gallops.  GI: Abdomen soft, non-tender, non-distended.  Extremities: No lower extremity edema.      LABS: Labs were reviewed.  HEP HEPATOLOGY Latest Ref Rng 08/14/2010 12/31/2009 10/16/2009   WBC 4.0 - 10.0 1000/mm3 3.2 (L) 3.3 (L) 2.7 (L)   SEGS 45 - 70 %      BAND 0 - 15 %      ANC 1.6 - 7.0 1000/mm3 2.1 2.2 1.8   HGB 11.2 - 15.7 gm/dL 40.3 47.4 25.9   HCT 56.3 - 45.0 % 36.8 37.4 36.4   PLT 140 - 370 1000/mm3 46 (L) 39 (L) 37 (L)   INR  1.5 1.4 1.4   NA 136 - 145 mmol/L 142 140 138   K 3.5 - 5.1 mmol/L 3.7 4.4 4.3   CL 98 - 107 mmol/L 106 106 104   BI 22 - 29 mmol/L 31 (H) 29 25   BUN 6 - 20 mg/dL 12 13 14    CR 0.51 - 0.95 mg/dL 8.75 6.43 3.29   GLUC 70 - 115 mg/dL 518 841 660 (H)   AST 0 - 32 U/L 31 30 22    ALT 0 - 33 U/L 35 (H) 33 18   TBILI <1.2 mg/dL 1.2 (H) 1.5 (H) 1.1   DBILI <0.2 mg/dL 0.3 (H) 0.4 (H) 0.3 (H)   ALK PHOS 35 - 140 U/L 124 113 103   GGT Low: <30 IU/L      ALB 3.5 - 5.2 g/dL 3.4 (L) 3.8 3.4 (L)   AFP Low: <8 ng/mL      MAG 1.8 - 2.5 mg/dL      PHOS 2.5 - 4.5 mg/dL      CAL 8.6 - 63.0 mg/dL 8.9 9.6 9.0   UR Low: Z:6.0-1.0 mg/dL       TSH 9.32 - 3.55 uIU/mL      T4 4.5 - 10.9 mcg/dL      CHOLESTEROL Low: < 200 mg/dL      TRI 10 - 732 mg/dL      HEP C RNA QUANT IU/mL      HEP B AG             MRI 12/2009  IMPRESSION:    1. Nodule assessment:  Multiple stable small late-enhancing nodules without arterial phase   enhancement (LIRADS 2).     2. Other significant findings:  Cirrhosis with splenomegaly.  Severe iron overload within the liver.  Persistent nonocclusive thrombus at the portal vein confluence and extending   into the cephalad aspect of the superior mesenteric vein posteriorly    Stable appearing pancreas with scattered cysts, pancreatic ductal dilatation   and irregularity within the pancreatic duct contour. These likely represent a  multifocal, predominantly side branch, benign intraductal papillary mucinous   neoplasm (IPMN). Attention to this finding can be performed on follow-up   examinations.    3. Recommend routine surveillance.    EGD 12/2009  Endoscopic Diagnosis:   1) No esophageal varices, small hiatal hernia  2) Mild portal hypertensive gastropathy  3) No gastric varices  4) Normal examined duodenum to 2nd portion    Recommendations:   1) Continue propranolol titrated to HR 50s  2) Repeat surveillance upper endoscopy in 1 year        IMPRESSIONS: This is a pleasant 70 year old woman with HCV GT2 cirrhosis (achieved SVR) fairly well compensated. 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 She is otherwise doing  well. Her MELD score is low. Proximal humerus fx 08/2009.    1) Varices screening. EGD showed no varices only mild portal gastropathy. Repeat in May 2012.  2) HCC screening. MRI in 12/2009 did not show any suspicious lesions and the pancreatic cysts are stable. Reordered MRI.  3) Fluid retention. Continue lasix 40 mg daily and aldactone 50 mg daily.   4) Diabetes. On oral hypoglycemic.  5) Return to hepatology clinic in 6 months.

## 2010-09-09 ENCOUNTER — Ambulatory Visit (INDEPENDENT_AMBULATORY_CARE_PROVIDER_SITE_OTHER): Admitting: Ophthalmology

## 2010-10-14 ENCOUNTER — Encounter (INDEPENDENT_AMBULATORY_CARE_PROVIDER_SITE_OTHER): Payer: Self-pay | Admitting: Ophthalmology

## 2010-10-14 ENCOUNTER — Encounter (INDEPENDENT_AMBULATORY_CARE_PROVIDER_SITE_OTHER): Payer: Self-pay

## 2010-10-28 ENCOUNTER — Encounter (INDEPENDENT_AMBULATORY_CARE_PROVIDER_SITE_OTHER)

## 2010-10-28 ENCOUNTER — Encounter (INDEPENDENT_AMBULATORY_CARE_PROVIDER_SITE_OTHER): Admitting: Ophthalmology

## 2010-12-11 ENCOUNTER — Other Ambulatory Visit: Payer: Self-pay | Admitting: Body Imaging

## 2010-12-13 ENCOUNTER — Other Ambulatory Visit: Payer: Self-pay

## 2010-12-27 ENCOUNTER — Other Ambulatory Visit: Payer: Self-pay

## 2010-12-27 ENCOUNTER — Inpatient Hospital Stay
Admission: EM | Admit: 2010-12-27 | Discharge: 2010-12-28 | Disposition: A | Discharge status: Home Health | Payer: Self-pay | Attending: Hospitalist | Admitting: Hospitalist

## 2010-12-27 DIAGNOSIS — B1920 Unspecified viral hepatitis C without hepatic coma: Secondary | ICD-10-CM | POA: Diagnosis present

## 2010-12-27 DIAGNOSIS — K7460 Unspecified cirrhosis of liver: Secondary | ICD-10-CM | POA: Diagnosis present

## 2010-12-27 DIAGNOSIS — J439 Emphysema, unspecified: Secondary | ICD-10-CM | POA: Diagnosis present

## 2010-12-27 DIAGNOSIS — E785 Hyperlipidemia, unspecified: Secondary | ICD-10-CM | POA: Diagnosis present

## 2010-12-27 DIAGNOSIS — I10 Essential (primary) hypertension: Secondary | ICD-10-CM | POA: Diagnosis present

## 2010-12-27 DIAGNOSIS — J918 Pleural effusion in other conditions classified elsewhere: Principal | ICD-10-CM | POA: Diagnosis present

## 2010-12-27 DIAGNOSIS — D649 Anemia, unspecified: Secondary | ICD-10-CM | POA: Diagnosis present

## 2010-12-27 DIAGNOSIS — Z9889 Other specified postprocedural states: Secondary | ICD-10-CM

## 2010-12-27 DIAGNOSIS — E1151 Type 2 diabetes mellitus with diabetic peripheral angiopathy without gangrene: Secondary | ICD-10-CM | POA: Diagnosis present

## 2010-12-27 MED ORDER — LIDOCAINE-EPINEPHRINE 1-1:100000 % IJ SOLN
20.0000 mL | Freq: Once | INTRAMUSCULAR | Status: DC | PRN
Start: 2010-12-27 — End: 2010-12-28
  Filled 2010-12-27: qty 30

## 2010-12-27 MED ORDER — FUROSEMIDE 10 MG/ML IJ SOLN
20.0000 mg | Freq: Once | INTRAMUSCULAR | Status: DC | PRN
Start: 2010-12-27 — End: 2010-12-28
  Filled 2010-12-27: qty 2

## 2010-12-27 NOTE — H&P (Addendum)
HISTORY AND PHYSICAL    Attending MD:   York Cerise*    Chief Complaint:  SOB, chest pressure    Pain Assessment:  The patient denies any pain.    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 She reports PND over this period, but no orthopnea (2 thin pillows, same as baseline). Chest pressure is not worse with walking or activity, though it is slightly worse with expiration. She is occasionally dizzy. She has also had an intermittent dry cough over the same period, and has had 4 days of night sweats. Reports possible slight increase in abd distension over the past week as well as some decreased po intake. No fevers, chills, nausea, vomiting, constipation, loose stools. She was treated by her PCP Rosina Lowenstein at [facility]) for a UTI for 4 days starting 1 week ago. No confusion, not using lactulose. Taking her medications as prescribed. She had a pleural effusion 6 years ago; this was drained at [facility] and did not recur until now.    In the ED, she was noted to have a R pleural effusion, which was tapped and drained in the ED after giving her 2 units of FFP and 2 units of platelets. She was also given furosemide 20mg  IV x1.    Past Medical and Surgical History:  Past Medical History   Diagnosis Date   . Migraine headache    . Glaucoma    . Ascites 06/29/2007     History of paracentesis x 2   . 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 Cirrhosis 06/24/2007     Active on the liver transplant waiting list with a blood type of O positive. 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 Osteopenia 06/24/2007   . Emphysema 06/24/2007   . EV (esophageal varices) 06/10/2007   . Anemia 06/10/2007   . HCV (hepatitis C virus) 06/10/2007     Achieved SVR.   Marland Kitchen DM circ dis type II 06/10/2007   . HTN 06/10/2007   . HTN 06/10/2007     Past  Surgical History   Procedure Date   . Lap,cholecystectomy/explore 1970s     in [place];  open CCX   . Cesarean delivery only    . Revision of bladder/urethra      Bladder suspension       Allergies:  Allergies   Allergen Reactions   . Advil (Ibuprofen Micronized) Other     Intolerance           Medications:  Prescriptions prior to admission   Medication Sig Dispense Refill   . furosemide (LASIX) 40 MG tablet Take 1 tablet by mouth daily.  60 tablet  0   . spironolactone (ALDACTONE) 25 MG tablet Take 2 tablets by mouth daily.  60 tablet  0   . glipiZIDE (GLUCOTROL) 10 MG tablet Take 1 tablet by mouth 2 times daily.  60 tablet  3   . insulin glargine (LANTUS) 100 UNIT/ML injection Inject 10 Units under the skin nightly.  1 Vial  2   . VITAMIN D 400 UNIT OR CAPS 1 CAPSULE DAILY  30  11   . CALCIUM CARBONATE 600 MG OR TABS 1 tablet 2 times daily  one month  11   .  DISCONTD: alendronate (FOSAMAX) 70 MG tablet Take 1 Tab by mouth every 7 days.  4 Tab  11       Social History:  History     Social History   . Marital Status: Married     Spouse Name: N/A     Number of Children: N/A   . Years of Education: N/A     Social History Main Topics   . Smoking status: Former Smoker -- 10 years   . Smokeless tobacco: Not on file   . Alcohol Use: No   . Drug Use: Not on file   . Sexually Active: Not on file     Other Topics Concern   . Not on file     Social History Narrative   . No narrative on file       Family History:  No family history on file.    Review of Systems:  Negative except as per HPI.    Physical Exam:  There were no vitals taken for this visit.  Gen: a&ox3, nad, lying in bed  HEENT: EOMI, PERRLA, mild scleral icterus, normal OP with mmm, no upper teeth  Neck: supple, no LAD  Pulm: CTAB, though breath sounds absent in R lower lung field; no rales, rhonchi, wheezing  CV: RRR, no m/r/g  Abd: soft, nt, nd, +bs, no fluid wave, no rebound or guarding, cholecystectomy scar in RUQ  Extrem: 2mm pretibial edema in bilat LE, 1+  pulses bilat DP, 2+ radial pulses, wwp, no c/c    Labs and Other Data:  Lab Results   Component Value Date    NA 136 12/27/2010    K 4.4 12/27/2010    CL 102 12/27/2010    BICARB 29 12/27/2010    BUN 12 12/27/2010    CREAT 0.78 12/27/2010    GLU 170* 12/27/2010    [place] 9.6 12/27/2010     Lab Results   Component Value Date    WBC 3.8* 12/27/2010    HGB 13.7 12/27/2010    HCT 37.9 12/27/2010    PLT 49* 12/27/2010    SEG 73* 12/27/2010    LYMPHS 19 12/27/2010    MONOS 6 12/27/2010    EOS 1 12/27/2010     No results found for this basename: AST, ALT, ALK, TBILI, DBILI, TP, ALB     Lab Results   Component Value Date    INR 1.5 12/27/2010    PTT 30.5 12/27/2010     No results found for this basename: TSH, FREET4, T3     Lab Results   Component Value Date    CPK 88 12/27/2010    CKMBH 1.7 12/27/2010    TROPONIN <0.01 12/27/2010     No results found for this basename: PHUA, SGUA, GLUCOSEUA, KETONEUA, BLOODUA, PROTEINUA, UROBILIUA, LEUKESTUA, NITRITEUA, WBCUA, RBCUA, EPITHCELLSUA, CRYSTALSUA, COMMENTSUA     Pleural fluid studies: TP, albumin, LDH, cell count, cultures pending    EKG: sinus bradycardia, ?peaked T waves (normal K)    CXR:  1. Intact regional skeleton, soft tissues with mild degenerative changes   thoracic spine.     2. Normal cardiac size, pulmonary vessels.     3. Interval development of a moderate right pleural effusion with no evidence  of left pleural effusion or pneumothorax.     4. There is passive atelectasis involving basal segments right lower lobe.     5. Likely right pleural effusion may be related to hepatohydrothorax. The   patient has  a history of hepatitis C and cirrhosis.      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    # Chest pain: Likely related to pleural effusion (management as below). Ruled out for ACS with negative markers and  normal EKG.    # Pleural effusion: Drained in ED, with labs sent as above. Will f/u studies in am. 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 Appreciate hepatology recs. Pt rec'd furosemide IV in ED x1. Will increase spironolactone to 100mg  po qday, cont furosemide 40mg  po qday.  - spironolactone 100mg  po qday  - furosemide 40mg  po qday  - f/u pleural fluid results    # HCV cirrhosis: Has been treated, with SVR. MELD is 12, not listed for transplant as a result. Appreciate hepatology recs. Pt will need repeat EV screening as outpt. If significant ascites noted on u/s, will consider paracentesis.  - diuretics as above  - abdominal u/s with dopplers    # DM2:  - A1c pending  - glargine 10 units qhs  - ISS  - holding glipizide    # HTN: Will cont diuretics as noted above, follow BPs.  - diuretics as above    # Osteoporosis: No longer taking alendronate.   - cont [place]/VitD    # FEN/GI:   - carb-limited and sodium-restricted diet    # PPx:  - no heparin 2/2 thrombocytopenia  - SCDs    FC/FC    Discussed with Dr. Ladona Ridgel, medicine attending.    This plan and alternatives have been discussed with the patient and/or surrogate.    Code Status:  No orders of the defined types were placed in this encounter.       The patient's primary care physician or clinic has been contacted regarding this admission.    Note Author: Junius Finner, 12/27/2010, 6:35 PM      Patient examined along with the team in the ED. Detailed note to follow.    70 y/o woman with cirrhosis, HTN, Hep C admitted with pleural effusion  Patient seen and examined in the Ed along with the team. The treatment plan was developed together with the team and I concur with the above presentation.    On examination, the patient is alert awake and oriented, does not appear to be in acute distress. Vitals reviewed.  S1S2 normally heard, fair air entry bilaterally, occasional crackles.   Abdomen soft non tender  No c/c/e. Good peripheral pulses. Neutral affect.  Examined with the team.      I have discussed the patient care with the team and rounded on the patient along with the team. I agree with the assessment and plan as per today's note.    Will get thorcaentesis.

## 2010-12-27 NOTE — Consults (Signed)
GI Consult 12/27/2010   Consult Attending:Arna Snipe, MD  Current Attending:Asudani, Joelyn Oms*  Consult Fellow: Edyth Gunnels, MD  Current hospital stay:   0 days - Admitted on: 12/27/2010    Chief Complaint: Chest pain  Reason for consult: Chest pain    HPI: Pt is 70 year old female with a pmh sig  HCV cirrhosis Gt2 with SVR who was removed from the transplant list due to low MELD c/w  Ascites and EV. No documentation of HE. Also Hx HTN, DM and IPMN. She was last seen in the liver clinic in Jan/2012. She has been on low dose diuretics. She  Comes with one week complaint of chest tightness. Per patient it is cosntant, bilateral, worse when she lays down. No cough. No fever. No shortness of breath. In The ED she was ruled out from ACS (normal EKG per ED that I couldn't find). She was found to have Right pleural effusion. No HE. No ascites. No other sxs    PMH:  Past Medical History   Diagnosis Date   . Migraine headache    . Glaucoma    . Ascites 06/29/2007     History of paracentesis x 2   . 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 Cirrhosis 06/24/2007     Active on the liver transplant waiting list with a blood type of O positive. 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 Osteopenia 06/24/2007   . Emphysema 06/24/2007   . EV (esophageal varices) 06/10/2007   . Anemia 06/10/2007   . HCV (hepatitis C virus) 06/10/2007     Achieved SVR.   Marland Kitchen DM circ dis type II 06/10/2007   . HTN 06/10/2007   . HTN 06/10/2007       Meds:  Current Facility-Administered Medications   Medication   . furosemide (LASIX) injection 20 mg       ALL: NKDA    FH: NC    SH:  History   Smoking status   . Former Smoker -- 10 years   Smokeless tobacco   . Not on file       ROS: 10 out of 14 systems reviewed, negative except noted in the HPI    PE:  Vitals - There were no vitals taken for this visit. blood pressure   Gen: A&O x 3 in NAD  HEENT: Eyes - sclera                 Mouth - MMM, O/P clear                Neck - supple, no  JVD  Lungs: Decrease breathing sounds to the mid level of th  Chest  Heart: RRR, nl S1,S2; no r/m/g  Abd: Soft, ND; non-tender (+)BS  Ext: No c/c/e  Skin: No rashes, non-jaundiced  Neuro: No asterixis    Labs:  Lab Results   Component Value Date    NA 136 12/27/2010    K 4.4 12/27/2010    CL 102 12/27/2010    BICARB 29 12/27/2010    BUN 12 12/27/2010    CREAT 0.78 12/27/2010    GLU 170 12/27/2010    [place] 9.6 12/27/2010     Lab Results   Component Value Date    AST 33 12/11/2010    ALT 39 12/11/2010    GGT 12 10/16/2004    ALK 122 12/11/2010    TP 7.3 12/11/2010    ALB 3.5  12/11/2010    TBILI 1.2 12/11/2010    DBILI 0.3 12/11/2010     Lab Results   Component Value Date    WBC 3.8 12/27/2010    RBC 4.21 12/27/2010    HGB 13.7 12/27/2010    HCT 37.9 12/27/2010    MCV 90.0 12/27/2010    MCHC 36.1 12/27/2010    RDW 14.1 12/27/2010    PLT 49 12/27/2010    PLT 47 04/23/2009    MPV 10.2 12/27/2010     Lab Results   Component Value Date    INR 1.5 12/27/2010    PTT 30.5 12/27/2010             A/P: 70 year old female with a pmh sig  HCV cirrhosis Gt2 with SVR who was removed from the transplant list due to low MELD c/w  Ascites and EV. No documentation of HE. Also Hx HTN, DM and IPMN. She was last seen in the liver clinic in Jan/2012. She has been on low dose diuretics. She  Comes with one week complaint of chest tightness. Per patient it is cosntant, bilateral, worse when she lays down. No cough. No fever. No shortness of breath. In The ED she was ruled out from ACS (normal EKG per ED that I couldn't find). She was found to have Right pleural effusion. No HE. No ascites. No other sxs      # Chest pain: Most likely due to pleural effusion              Pain is severe and she can't sleep              Will recommend thora and send fluid for studies, TP, Albumin. LDH, Diff, Cx....              Transfuse plt for para and may consider FFP if needed              Increase diuretics to Lasix 40 and aldactone 100 and will consider increasing as needed              Strict  I&Os              Abd [place] to rule ascites (With doppler). If any, needs para (Send fluid for Diff, Cx, Tp and Albumin)    # HCV cirhrosis: She is SVR. She is not listed due to low MELD       Today's MELD is 12       2g Na diet       EV: Due for EGD for EV (elective)       HE: None       Ascites: As above (she was on low dose of L/A 40/50) but will increase now. 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 She denies fever, cough or SOB.     See resident / fellow history and physical for further details of the patient's history.  Objective    I have examined the patient and concur with the fellow's exam.    Labs were reviewed.    Assessment and Plan  I agree with the fellow's care plan.    70 yo woman with HCV cirrhosis (GT2 SVR), who was removed from transplant list due to low MELD, c/w ascites and EV, came to ED with one week of persistent chest tightness. She denies fever, cough or SOB.     Chest pain: ACS is ruled out per ED.  likely due to pleural effusion. Recommend thoracentesis/fluid analysis. Would increase diuretics lasx 40/aldactone 100.     HCV cirrhosis: MELD 12. Not on transplant listing.     See the resident / fellow history and physical for further details.    Thank you very much for this kind consultation.     Arna Snipe, MD  PID [id number]

## 2010-12-28 DIAGNOSIS — Z9889 Other specified postprocedural states: Secondary | ICD-10-CM

## 2010-12-28 LAB — BODY FLUID CELL CT / DIFF
Fluid RBC mm3: 1480 uL
Fluid WBC mm3: 685 uL
Lymphocytes fluid %: 88 %
Macrophages fluid %: 7 %
Mesothelial Cells Fluid %: 3 %
Neutrophils Fluid %: 2 %

## 2010-12-28 LAB — TOTAL PROTEIN, OTHER: Total Protein, Other: 2 g/dL

## 2010-12-28 LAB — URINALYSIS
Nitrite: NEGATIVE
Urobilinogen: 2 — AB (ref 0.2–1)
pH: 7.5 (ref 5.0–8.0)

## 2010-12-28 LAB — GLUCOSE, OTHER: Glucose, Other: 264 mg/dL

## 2010-12-28 LAB — ALBUMIN, OTHER: Albumin, Other: 1 g/dL

## 2010-12-28 LAB — AMYLASE, OTHER: Amylase, Other: 20 U/L

## 2010-12-28 MED ORDER — SODIUM CHLORIDE 0.9 % IV SOLN
INTRAVENOUS | Status: DC | PRN
Start: 2010-12-28 — End: 2010-12-28

## 2010-12-28 MED ORDER — GLUCOSE 4 GM PO CHEW (CUSTOM)
4.0000 | CHEWABLE_TABLET | ORAL | Status: DC | PRN
Start: 2010-12-28 — End: 2010-12-28

## 2010-12-28 MED ORDER — INSULIN REGULAR HUMAN 100 UNIT/ML IJ SOLN
5.0000 [IU] | Freq: Once | INTRAMUSCULAR | Status: DC | PRN
Start: 2010-12-28 — End: 2010-12-28
  Filled 2010-12-28: qty 5

## 2010-12-28 MED ORDER — TRAMADOL HCL 50 MG OR TABS
50.0000 mg | ORAL_TABLET | Freq: Four times a day (QID) | ORAL | Status: DC | PRN
Start: 2010-12-28 — End: 2013-11-01

## 2010-12-28 MED ORDER — ACETAMINOPHEN 325 MG PO TABS
650.0000 mg | ORAL_TABLET | ORAL | Status: DC | PRN
Start: 2010-12-28 — End: 2010-12-28

## 2010-12-28 MED ORDER — FUROSEMIDE 40 MG OR TABS
40.0000 mg | ORAL_TABLET | Freq: Every day | ORAL | Status: DC
Start: 2010-12-28 — End: 2010-12-28
  Filled 2010-12-28: qty 1

## 2010-12-28 MED ORDER — POTASSIUM CHLORIDE 20 MEQ/15ML (10%) OR SOLN
40.0000 meq | Freq: Once | ORAL | Status: AC
Start: 2010-12-28 — End: 2010-12-28
  Administered 2010-12-28: 40 meq via ORAL
  Filled 2010-12-28: qty 30

## 2010-12-28 MED ORDER — CALCIUM CARBONATE-VITAMIN D 600-10 MG-MCG PO TABS (CUSTOM)
1.0000 | ORAL_TABLET | Freq: Every day | ORAL | Status: DC
Start: 2010-12-28 — End: 2010-12-28
  Administered 2010-12-28: 09:00:00 1 via ORAL
  Filled 2010-12-28: qty 1

## 2010-12-28 MED ORDER — SPIRONOLACTONE 100 MG OR TABS
100.0000 mg | ORAL_TABLET | Freq: Every day | ORAL | Status: DC
Start: 2010-12-28 — End: 2010-12-28
  Administered 2010-12-28: 100 mg via ORAL
  Filled 2010-12-28: qty 1

## 2010-12-28 MED ORDER — SODIUM CHLORIDE 0.9 % IJ SOLN (CUSTOM)
3.0000 mL | Freq: Three times a day (TID) | INTRAMUSCULAR | Status: DC
Start: 2010-12-28 — End: 2010-12-28

## 2010-12-28 MED ORDER — PNEUMOCOCCAL VAC POLYVALENT 25 MCG/0.5ML IJ INJ (CUSTOM)
0.5000 mL | INJECTION | INTRAMUSCULAR | Status: AC
Start: 2010-12-28 — End: 2010-12-28
  Administered 2010-12-28: 0.5 mL via INTRAMUSCULAR
  Filled 2010-12-28: qty 0.5

## 2010-12-28 MED ORDER — SPIRONOLACTONE 25 MG OR TABS
100.0000 mg | ORAL_TABLET | Freq: Every day | ORAL | Status: DC
Start: 2010-12-28 — End: 2011-10-17

## 2010-12-28 MED ORDER — SODIUM CHLORIDE 0.9 % IJ SOLN (CUSTOM)
3.0000 mL | INTRAMUSCULAR | Status: DC | PRN
Start: 2010-12-28 — End: 2010-12-28

## 2010-12-28 MED ORDER — INSULIN GLARGINE 100 UNIT/ML SC SOLN
10.0000 [IU] | Freq: Every evening | SUBCUTANEOUS | Status: DC
Start: 2010-12-28 — End: 2010-12-28

## 2010-12-28 MED ORDER — DEXTROSE 50 % IV SOLN
12.5000 g | INTRAVENOUS | Status: DC | PRN
Start: 2010-12-28 — End: 2010-12-28

## 2010-12-28 MED ORDER — POTASSIUM CHLORIDE 20 MEQ/15ML (10%) OR SOLN
40.0000 meq | Freq: Once | ORAL | Status: DC
Start: 2010-12-28 — End: 2010-12-28
  Filled 2010-12-28: qty 30

## 2010-12-28 MED ORDER — DEXTROSE (DIABETIC USE) 40 % OR GEL
1.0000 | ORAL | Status: DC | PRN
Start: 2010-12-28 — End: 2010-12-28

## 2010-12-28 MED ORDER — GLUCAGON HCL (RDNA) 1 MG IJ SOLR
1.0000 mg | Freq: Once | INTRAMUSCULAR | Status: DC | PRN
Start: 2010-12-28 — End: 2010-12-28

## 2010-12-28 MED ORDER — ALUM & MAG HYDROXIDE-SIMETH 200-200-20 MG/5ML OR SUSP
30.0000 mL | Freq: Four times a day (QID) | ORAL | Status: DC | PRN
Start: 2010-12-28 — End: 2010-12-28

## 2010-12-28 MED ORDER — INSULIN LISPRO (HUMAN) 100 UNIT/ML SC SOLN (CUSTOM)
1.0000 [IU] | Freq: Four times a day (QID) | INTRAMUSCULAR | Status: DC
Start: 2010-12-28 — End: 2010-12-28
  Administered 2010-12-28: 01:00:00 4 [IU] via SUBCUTANEOUS

## 2010-12-28 NOTE — Discharge Summary (Signed)
Date of Admission:  12/27/2010  Date of Discharge:      Patient Name:  [name]    Principal Diagnosis (required):  Pleural effusion associated with hepatic disorder    Hospital Problem List (required):  No resolved problems to display.    Active Hospital Problems   Diagnoses   . *Pleural effusion associated with hepatic disorder [511.89]   . S/P thoracentesis [V45.89]   . Hypertension [401.9]   . Hyperlipidemia [272.4]   . Cirrhosis [571.5]   . Emphysema [492.8]   . HTN [401.9]   . DM circ dis type II [250.70]   . HCV (hepatitis C virus) [070.70]   . Anemia [285.9]      Resolved Hospital Problems   Diagnoses       Additional Hospital Diagnoses ("rule out" or "suspected" diagnoses, etc.):  None    Principal Procedure During This Hospitalization (required):  Transfusion of 2 Units of platelets and 2 units of FFP.    Other Procedures Performed During This Hospitalization (required):  S/P Thoracentesis    Procedure results are available in Chart Review in Epic.  For those providers external to [facility], the key procedure results are listed below:  CXR:   FINDINGS/IMPRESSION:  1. Intact regional skeleton, soft tissues with mild degenerative changes   thoracic spine.     2. Normal cardiac size, pulmonary vessels.     3. Interval development of a moderate right pleural effusion with no evidence  of left pleural effusion or pneumothorax.     4. There is passive atelectasis involving basal segments right lower lobe.     5. Likely right pleural effusion may be related to hepatohydrothorax. The   patient has a history of hepatitis C and cirrhosis.      Consultations Obtained During This Hospitalization:  Gastroenterology    Key consultant recommendations:  Hepatology:   # Chest pain: Most likely due to pleural effusion   Pain is severe and she can't sleep   Will recommend thora and send fluid for studies, TP, Albumin. LDH, Diff, Cx....   Transfuse plt for para and may consider FFP if needed   Increase diuretics to Lasix  40 and aldactone 100 and will consider increasing as needed   Strict I&Os   Abd [place] to rule ascites (With doppler). If any, needs para (Send fluid for Diff, Cx, Tp and Albumin)   # HCV cirhrosis: She is SVR. She is not listed due to low MELD   Today's MELD is 12   2g Na diet   EV: Due for EGD for EV (elective)   HE: None   Ascites: As above (she was on low dose of L/A 40/50) but will increase now. 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 She reports PND over this period, but no orthopnea (2 thin pillows, same as baseline). Chest pressure is not worse with walking or activity, though it is slightly worse with expiration. She is occasionally dizzy. She has also had an intermittent dry cough over the same  period, and has had 4 days of night sweats. Reports possible slight increase in abd distension over the past week as well as some decreased po intake. No fevers, chills, nausea, vomiting, constipation, loose stools. She was treated by her PCP Rosina Lowenstein at [facility]) for a UTI for 4 days starting 1 week ago. No confusion, not using lactulose. Taking her medications as prescribed. She had a pleural effusion 6 years ago; this was drained at [facility] and did not recur until now.   In the ED, she was noted to have a R pleural effusion, which was tapped and drained in the ED after giving her 2 units of FFP and 2 units of platelets. She was also given furosemide 20mg  IV x1.     Hospital course:    # Chest pain: Likely related to pleural effusion (management as below). Ruled out for ACS with negative markers and normal EKG. 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 Drained in ED, and pleural fluid analysis is consistent with transudate effusion and no evidence of infection. Pt rec'd furosemide IV in ED x1. The dose of spironolactone increased to 100mg  po qday per hepatology recommendation and  cont furosemide 40mg  po qday.     # HCV cirrhosis: Has been treated, with SVR. MELD is 12, not listed for transplant as a result. Pt will need repeat EV screening as outpt. No significant ascites on exam.  Continue diuretics as above and abdominal u/s with dopplers as outpatient.   # DM2: BS on admission was 359, continued on her home dose  glargine 10 units qhs and ISS. Her BS on discharge- 146. Pt will continue her home dose Glargine 10U and Glipizide.     # HTN: BP stable. Dose of Aldactone increased as above     # Osteoporosis: No longer taking alendronate. Cont [place]/VitD.    Tests Outstanding at Discharge Requiring Follow Up:  None    Discharge Condition (required):  Improved.    Key Physical Exam Findings at Discharge:  No significant physical examination findings at the time of discharge.    Discharge Diet:  Low-salt and Diabetic / low-carbohydrate.    Discharge Medications:  Current Discharge Medication List      START taking these medications    Details   traMADol (ULTRAM) 50 MG tablet Take 1 tablet by mouth every 6 hours as needed for Moderate Pain (Pain Score 4-6).  Qty: 30 tablet, Refills: 0    Associated Diagnoses: S/P thoracentesis         CONTINUE these medications which have CHANGED    Details   spironolactone (ALDACTONE) 25 MG tablet Take 4 tablets by mouth daily.  Qty: 60 tablet, Refills: 0    Associated Diagnoses: Ascites         CONTINUE these medications which have NOT CHANGED    Details   furosemide (LASIX) 40 MG tablet Take 1 tablet by mouth daily.  Qty: 60 tablet, Refills: 0    Associated Diagnoses: Ascites      glipiZIDE (GLUCOTROL) 10 MG tablet Take 1 tablet by mouth 2 times  daily.  Qty: 60 tablet, Refills: 3    Associated Diagnoses: DM circ dis type II      insulin glargine (LANTUS) 100 UNIT/ML injection Inject 10 Units under the skin nightly.  Qty: 1 Vial, Refills: 2    Associated Diagnoses: DM circ dis type II      VITAMIN D 400 UNIT  OR CAPS 1 CAPSULE DAILY  Qty: 30, Refills: 11    Associated Diagnoses: Osteopenia      CALCIUM CARBONATE 600 MG OR TABS 1 tablet 2 times daily  Qty: one month, Refills: 11    Associated Diagnoses: Osteopenia             Allergies:  Allergies   Allergen Reactions   . Advil (Ibuprofen Micronized) Other     Intolerance           Discharge Disposition:  Home.    Discharge Code Status:  Full code / full care  This code status is not changed from the time of admission.    Follow Up Appointments:    Scheduled appointments:  Future Appointments  Date Time Provider Department Center   01/03/2011 9:30 AM Humphrey, [facility] None   01/03/2011 10:00 AM Karlton Lemon, MD [facility] None   02/13/2011 2:40 PM Leone Payor, MD [facility] None       For appointments requested for after discharge that have not yet been scheduled, refer to the Post Discharge Referrals section of the After Visit Summary.    Discharging Physician's Contact Information:  [facility] Medicine phone triage at [phone].

## 2010-12-28 NOTE — Progress Notes (Signed)
Hepatology Note  Consult Attending:Arna Snipe, MD  Current hospital stay:   1 day - Admitted on: 12/27/2010    Chief Complaint: Chest pain  Reason for consult: Chest pain    Events: Doing okay. Had thoracentesis. Transudate on TP criteria. LD not obtained. Gram stain negative. Chest pressure resolved.    Meds:  Current Facility-Administered Medications   Medication   . furosemide (LASIX) tablet 40 mg   . spironolactone (ALDACTONE) tablet 100 mg   . calcium-vitamin D (CALTRATE 600+D) 600-400 MG-UNIT tablet 1 tablet   . sodium chloride 0.9 % flush 3 mL   . sodium chloride 0.9 % flush 3 mL   . sodium chloride 0.9% infusion   . acetaminophen (TYLENOL) tablet 650 mg   . aluminum-magnesium-simethicone (MAG-AL PLUS) 200-200-20 MG/5ML suspension 30 mL   . glucose chewable tablet 16 g   . glucose 40% oral gel 1 Tube   . dextrose 50 % solution 12.5 g   . glucagon (GLUCAGON) injection 1 mg   . insulin lispro (HUMALOG) injection 1-10 Units   . insulin glargine (LANTUS) injection 10 Units   . potassium chloride solution 40 mEq   . DISCONTD: insulin regular (HUMULIN,NOVOLIN) injection 5 Units   . DISCONTD: furosemide (LASIX) injection 20 mg   . DISCONTD: lidocaine-EPINEPHrine (XYLOCAINE WITH EPINEPHRINE) 1-1:100000 % injection 20 mL     PE:  Vitals - BP 122/55  Pulse 66  Temp 97.3 F (36.3 C)  Resp 12  Ht 5\' 5"  (1.651 m)  Wt 61.236 kg (135 lb)  BMI 22.47 kg/m2  SpO2 96% blood pressure   Gen: A&O x 3 in NAD  HEENT: Eyes - sclera                 Mouth - MMM, O/P clear                Neck - supple, no JVD  Lungs: Minimal crackles at R base with diminished breath sounds, not severe. Good effort  Heart: RRR, nl S1,S2; no r/m/g  Abd: Soft, ND; non-tender (+)BS  Ext: No c/c/e  Skin: No rashes, non-jaundiced  Neuro: No asterixis    Labs:  Lab Results   Component Value Date    NA 138 12/28/2010    K 3.3 12/28/2010    CL 104 12/28/2010    BICARB 27 12/28/2010    BUN 13 12/28/2010    CREAT 0.66 12/28/2010    GLU 170 12/28/2010    [place] 9.0  12/28/2010     Lab Results   Component Value Date    AST 31 12/28/2010    ALT 30 12/28/2010    GGT 12 10/16/2004    LDH 351 12/27/2010    ALK 114 12/28/2010    TP 7.0 12/28/2010    ALB 3.3 12/28/2010    TBILI 1.2 12/28/2010    DBILI 0.3 12/28/2010     Lab Results   Component Value Date    WBC 3.9 12/28/2010    RBC 3.43 12/28/2010    HGB 11.1 12/28/2010    HCT 30.7 12/28/2010    MCV 89.5 12/28/2010    MCHC 36.2 12/28/2010    RDW 14.0 12/28/2010    PLT 56 12/28/2010    PLT 47 04/23/2009    MPV 9.7 12/28/2010     Lab Results   Component Value Date    INR 1.5 12/28/2010    PTT 30.5 12/27/2010     A/P: 70 year old female with a pmh  sig  HCV cirrhosis Gt2 with SVR who was removed from the transplant list due to low MELD c/w  Ascites and EV. No documentation of HE. Also Hx HTN, DM and IPMN. She was last seen in the liver clinic in Jan/2012. She has been on low dose diuretics. She  Comes with one week complaint of chest tightness. Per patient it is cosntant, bilateral, worse when she lays down. No cough. No fever. No shortness of breath. In The ED she was ruled out from ACS (normal EKG per ED that I couldn't find). She was found to have Right pleural effusion. No HE. No ascites. No other sxs      # Chest pain: Most likely due to pleural effusion              S/p thora and feels better              Transfuse plt for para and may consider FFP if needed              Lasix 40 and aldactone 100 and will consider increasing as needed              Strict I&Os              Abd [place] to rule ascites (With doppler). If any and able to be tapped, needs para (Send fluid for Diff, Cx,    TP and Albumin)    # HCV cirrhosis: She is SVR. She is not listed due to low MELD       Admit MELD is 12, now 11.       2g Na diet       EV: Due for EGD for EV (elective)       HE: None       Ascites: As above (she was on low dose of L/A 40/50) but will increase now. Abd [place] with Dopplers.       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 Plan for home today per primary team.    ATTENDING PROGRESS NOTE ATTESTATION   Subjective   Interval history: Had thora. Chest pressure resolved.  See resident / fellow history and physical for further details of the patient's history.     Objective   I have examined the patient and concur with the fellow's exam.   Labs were reviewed.     Assessment and Plan   I agree with the fellow's care plan.   70 yo woman with HCV cirrhosis (GT2 SVR), who was removed from transplant list due to low MELD, c/w ascites and EV, came to ED with one week of persistent chest tightness. She denies fever, cough or SOB.     Chest pain: ACS is ruled out per ED. Better after throacentesis. No infection. Cont lasx 40/aldactone 100.    HCV cirrhosis: MELD 12. Not on transplant list.     See the resident / fellow history and physical for further details.     Follow up with Dr. Lissa Merlin in the clinic in 2-3 weeks.     Arna Snipe, MD PID [id number]

## 2010-12-28 NOTE — Plan of Care (Signed)
Problem: Alteration in Blood Glucose  Goal: Glucose level within specified parameters  Outcome: Not Met  Blood sugar was 359. Lispro 4 units sq administered per sliding scale.

## 2010-12-28 NOTE — ED Notes (Addendum)
=================================   ORDERS ==================================    ACT- MD    MD   RN   AP                                           IVE DATE  TIME TIME TIME  TREATMENT ORDERS                       MD   RN     ---------------------------------------------------------------------------

## 2010-12-28 NOTE — Plan of Care (Signed)
Problem: Discharge Planning  Goal: Participation in care planning  Outcome: Met  Dr Deidre Ala has spoken w/pt and her son (by phone) and has explained D/C instructions to them. Son speaks English, so instructions provided to pt is limited. But Son V/U plan for d/c and to increase Spirolactone dose to 100mg . Otherwise no changes to instruct. Will r/v instructions prior to d/c.
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
## 2010-12-28 NOTE — ED Notes (Addendum)
==============================   ATTENDING NOTE =============================    05/18 1307    Pamelia Hoit, MD Attending            Patient seen and examined, and discussed with house staff          and I agree with the plans for this 70 year old patient with          end-stage liver disease, diabetes, hypertension, who          complains of several days of difficulty breathing          particularly when she lays flat with an associated          pressure-like pain. The patient has also noticed some          increase in the swelling in her abdomen, but has not changed          her medications or altered her diet. She's had no recent          fevers or chills, although the patient does have a past          history of spontaneous bacterial peritonitis. She feels          otherwise well.                    Physical exam          Chronically ill appearing lady in no acute distress speaking          to me via translation of her son.          Vital signs blood pressure 150/56, heart rate 56, afebrile,          O2 sat 100, respirations 16 and unlabored          HEENT normal          Neck no JVD          Back nontender          Lungs with diminished breath sounds at the right base          approximately 1/4 the way up          Cardiac exam regular rate and rhythm, grade 1 systolic          murmur          Abdomen distended, nontender, likely moderate ascites. No          organomegaly palpable. Bowel sounds active          Extremities without edema                    Assessment          Atypical chest pain and shortness of breath, in a patient          with ascites, and prior history of SBP and hydrothorax. I          believe the most likely cause of this is ascites build up,          there is no indication for infection, and the patient          appears otherwise well. I doubt she has had a pulmonary          embolism or pneumonia.                    Plan          Screening x-rays labs and reassess.

## 2010-12-28 NOTE — Discharge Instructions (Signed)
Diagnosis and Reason for Admission    You were admitted to the hospital for the following reason(s):  Right pleural effusion    Your full diagnosis list is located on this After Visit Summary in the Hospital Problems section.    What Happened During Your Hospital Stay    The main tests and treatments done for you during this hospitalization were:    Chest X-ray,  Blood transfusion and thoracentesis    The following evaluation is still important to complete after discharge from the hospital:  Follow up at hepatology clinic    Instructions for After Discharge    Your diet at home should be a low-salt and diabetic (low-sugar) diet.    Your activity level at home should be:  as much exercise or activity as you can tolerate.    Specific activity restrictions:    None    Wound or tube care instructions:  None    Your medication list is located on this After Visit Summary in the Current Discharge Medication List section.  Your nurse will review this information with you before you leave the hospital.    It is very important for you to keep a current medication list with you in order to assist your doctors with your medical care.  Bring this After Visit Summary with you to your follow up appointments.    Reasons to Contact a Doctor Urgently    Call 911 or return to the hospital immediately if:  Chest pain, difficulty of breathing/ worsening shortness of breath    You should contact either your primary care physician or your hospital physician for any of the following reasons: abdominal pain, increased abdominal distension, blood vomiting mor blood     If you have any questions about your hospital care, your medications, or if you have new or concerning symptoms soon after going home from the hospital, and you need to contact your hospital physician, your hospital physician can be contacted in the following manner:  [facility] operator at [phone].    Once you are able to see your primary care physician (PCP),  your PCP will then be responsible for further medication refills, or appointment referrals.    What Needs to Happen Next After Discharge -- Appointments and Follow Up    Any appointments already scheduled at [facility] will be listed in the Future Appointments section at the top of this After Visit Summary.  Any appointments that have been requested, but have not yet been scheduled, will be listed below that under Post Discharge Referrals.    Sometimes tests performed in the hospital do not yet have results by the time a patient goes home.  The following key tests will need to be followed up at your next appointment: None    Additional Information for You from your Case Manager and/or Social Worker (if applicable)        Additional Information for You from your Inpatient Therapists (if applicable)        Additional Information for You from your Discharging Nurse (if applicable)        Handouts Given to You (if applicable)

## 2010-12-28 NOTE — Plan of Care (Signed)
Problem: Discharge Planning  Goal: Verbalizes/demonstrates knowledge of discharge instructions  Outcome: Met  Reviewed D/C instructions w/pt and her daughter at bedside. They V/U of medications r/v'd, and for f/u appt at [facility]. All info highlighted by RN and d/w them at bedside. Pt D/C home w/daughter and husband via wc by [facility].

## 2010-12-28 NOTE — Plan of Care (Signed)
Problem: Airway Clearance - Ineffective  Goal: Respiratory rate, depth and rhythm to baseline  Outcome: Met  Patient denies sob and chest pressure. On room air, saturating at 97-98%. LCTAB, absent breath sounds on the  right lower lobe.     Problem: Falls - Risk of  Goal: Absence of falls  Outcome: Met  No fall during the shift. Assisted pt to [facility] with steady gait, tolerated well.

## 2010-12-28 NOTE — ED Notes (Addendum)
 FINGER STICK GLUCOSE   Norm results 65 to 110 mg/dl  DATE  TIME    RESULTS     DONE BY    05/19 0013    450         aw                      FINGER STICK HEMOGLOBIN   Norm results adult female 60 to 17 gm/dl  Norm results adult female 79 to 16 gm/dl  DATE  TIME    RESULTS     DONE BY      ============================== MD NOTES H&P ===============================    TIME OF NOTE WRITTEN- N/A                                                    CHIEF COMPLAINT- Shortness Of Breath                                           HISTORY OF PRESENT ILLNESS  05/18 1117    [place], [place]            70 yo female with hx of hep c/liver cirrhosis, ascites,          DM,and emphysema who presents with 8 days of fatigue,          subjective fevers, sob/chest pain with dry cough L>R/night          sweats, worse in supine position, edema in LE's, and          ascites. Pt reports hx of "fluid in lungs" several years          ago. Previous taps several years ago. Takes spironolactone,          glipizide, lantus, furosemide, alendronate, vit D, and          calcium carbonate. Denies nausea/vomiting. Denies pain in          abdomen, denies dysuria, has regular BM's.    05/18 1117    Shawn Route, [place]            Add: on list for liver transplant    05/18 [address] [place]            Add: correction: reports mild pain in abdomen, but not          tender to palpation.      PAST MEDICAL/SURGICAL HISTORY          N/A           FAMILY HISTORY- N/A                                                          SOCIAL HISTORY-       SMOKING   ALCOHOL   ILLICIT DRUGS   HOMELESS   MARRIED   EMPLOYED         N/A       N/A           N/A  N/A        M             DISABLED  OTHER- N/A                                                                   REVIEW OF SYSTEMS- N/A                                                         PHYSICAL EXAM  05/18 1149    Shawn Route, [place]            Vital signs reviewed and noted. Hypertensive, otherwise  nl.                    GEN: a&ox3, spanish speaking, in nad, non-toxic appearing.          HEAD: nc/at. perrl, eomi.          NECK: Supple, no LAD.          LUNGS: mild crackles at transition zone. decreased breath          sounds in bases          HEART:  RRR, nl s1/s2, no m/r/g.          ABD: Soft, distended, NT, +BS          BACK: no CVA tenderness          EXT: mild b/l lower extremity edema, good pulses          SKIN: Warm, dry, no rashes          NEURO: aaox3, mae x 4, no focal deficits      IMPRESSION  05/18 1231    Shawn Route, [place]            70 yo female with hx of hep c/liver cirrhosis, ascites,          DM,and emphysema who presents with 8 days of fatigue,          subjective fevers, sob/chest pain with dry cough L>R/night          sweats, worse in supine position, edema in LE's, and          ascites. Denies n/v/dysuria, denies ttp over abdomen. Has          regular BM's. Plan:          chest xray          ekg and cardiac markers          cbc, chem, bnp, pt/ptt          IV access          monitor          Reassess after xray and labs. Consider tap if needed.      MEDICAL DECISION MAKING  05/19 0717    Shawn Route, [place]            Pt admitted to IMU. Please see attending notes.    CASE PRESENTED TO- N/A                                                         =============================  PHYSICIAN NOTES =============================    05/18 1455    [place], [place]            R sided pleural effusion seen on bedside US.    05/18 1521    [place] Shock, MD Attending            assumed care from Dr. Marylene Land: 69 hep c with cirrhosis here          with weakness for week.  no appreciable fluid collection on          u/s to tap. hx of hydrothorax, sbp.  abd soft today.  large          pleural effusion.  on transplant list.  case d/w hepatology          - will d/w attending. may need plt, thoracentesis admit.           await hepatology dispo    05/19 0014    Charolette Child, MD            glucose 450, will give  insulin 5 units SC.      ============================= PROCEDURE NOTES =============================      ================================ LAB NOTES ================================    05/18 1150    Rosalia Hammers, MD            gluc elevated          trop neg          inr 1.5          wbc nl    05/18 1735    [place] Shock, MD Attending            trop <0.01 wnl      ================================= IMAGES ==================================      ============================== MD DISCHARGE ===============================    DISCHARGE PHYSICIAN- Rosalia Hammers                                       CHIEF COMPLAINT- Shortness Of Breath  CASE PRESENTED TO- Pamelia Hoit         CONDITION OF DISCHARGE- Improving       WAS THIS VISIT FOR A WORK RELATED ILLNESS OR INJURY- No                      PRIMARY CARE PHYSICIAN- Rene Paci M.D.                               HAS PCP BEEN CONTACTED- N/A           H&P NOTE WAS DICTATED- No     +-------------------------- DISCHARGE DIAGNOSIS --------------------------+          786.05  SHORTNESS BREATH                                               +------------------------- DISCHARGE INSTRUCTIONS ------------------------+      +----------------------- MEDICATION RECONCILIATION -----------------------+    ---------------------------- CURRENT MEDICATION ---------------------------  MEDICATION NAME                       DOSAGE                FREQUENCY  ---------------------------------------------------------------------------  Lantus                                10 UNIT/ML            Once a Day        ---------------------------- STOPPED MEDICATION ---------------------------  MEDICATION NAME                       DOSAGE                FREQUENCY  ---------------------------------------------------------------------------      ---------------------------- UPDATED MEDICATION ---------------------------  MEDICATION NAME                       DOSAGE                 FREQUENCY  ---------------------------------------------------------------------------      ----------------------------- ADDED MEDICATION ----------------------------  MEDICATION NAME                       DOSAGE                FREQUENCY  ---------------------------------------------------------------------------        ============================= FOLLOW UP NOTES =============================

## 2010-12-28 NOTE — Plan of Care (Signed)
Problem: Airway Clearance - Ineffective  Goal: Lungs are clear to auscultation  If hypoventilation is present, implement Breathing Pattern - Ineffective, plan of care.   Outcome: Met  Lungs clear and diminished. Pt denies any SOB or dyspnea. Also denies any chest discomfort. Slept well O/N, reports feeling tired. Plan is for D/C Home today. MDs have been at bedside this AM.     Problem: Falls - Risk of  Goal: Absence of falls  Outcome: Met  Pt ambulatory, and not requiring assistance. Steady gait with short distances. Able to get OOB independently.

## 2010-12-29 LAB — ECG, COMPLETE (HC/~~LOC~~/ENCINITAS)
QRS INTERVAL/DURATION: 74 ms
VENTRICULAR RATE: 54 {beats}/min

## 2011-01-02 LAB — STERILE FLUID CULTURE W/GRAM STAIN, AEROBIC
Fluid Culture: NO GROWTH
Gram Stain: NONE SEEN

## 2011-01-03 ENCOUNTER — Ambulatory Visit (INDEPENDENT_AMBULATORY_CARE_PROVIDER_SITE_OTHER)

## 2011-01-03 ENCOUNTER — Ambulatory Visit (INDEPENDENT_AMBULATORY_CARE_PROVIDER_SITE_OTHER): Admitting: Ophthalmology

## 2011-01-03 LAB — ANAEROBIC CULTURE

## 2011-01-08 ENCOUNTER — Ambulatory Visit
Admit: 2011-01-08 | Discharge: 2011-01-11 | Payer: Self-pay | Source: Ambulatory Visit | Attending: Gastroenterology | Admitting: Gastroenterology

## 2011-01-08 NOTE — Procedures (Signed)
Report Author:  Leone Payor, M.D.    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 The  patient was explained the risks, benefits, and alternatives,  and signed consent was obtained.    INSTRUMENTS:    MEDICATIONS: Demerol  75 mg IVP, Versed 3 mg IVP    PROCEDURE TECHNIQUE: A physical exam was  performed. Informed consent was obtained from the patient  after explaining all the risks (perforation, bleeding, infection  and adverse effects to the medicine) , benefits and  alternatives to the procedure which the patient appeared to  understand and so stated.  The patient was connected to  the monitoring devices and placed in the left lateral position.  Continuous oxygen was provided with a nasal cannula and  IV medicine administered through a indwelling cannula.  After adequate conscious sedation was achieved, the  patient was intubated and the scope advanced under direct  visualization to the second part of duodenum.  The scope  was subsequently removed slowly while carefully examining  the color, texture, anatomy, and integrity of the mucosa on  the way out. The patient was subsequently transferred to  the recovery area in satisfactory condition.      ESTIMATED BLOOD LOSS: None. SPECIMEN:No  Biopsies Taken    Findings: Trace esophageal varices. Moderate sized hiatal  hernia  Mild portal hypertensive gastropathy. No gastric varices  Normal examined portion of the duodenum    Endoscopic Diagnosis: 1) Trace esophageal varices  2) Moderate hiatus hernia  3) Mild PHG  4) Normal examined portion of the duodenum    Recommendations: 1) Repeat surveillance EGD in one year  2) Continue propanolol BID  3) Follow-up in  hepatology clinic as previously scheduled            Electronically signed by:  Leone Payor, M.D. 01/15/2011 07:51 A          DD: 01/08/2011    DT:  01/08/2011 07:41 A  DocNo.:  [id number]  AK/bsm    Referring Physician:  Leone Payor MD  [address]  [place] [address]    Primary Care Physician:  Rene Paci M.D.  [address] [place], [place] [address]    cc:

## 2011-01-13 ENCOUNTER — Encounter (HOSPITAL_BASED_OUTPATIENT_CLINIC_OR_DEPARTMENT_OTHER): Admitting: Family

## 2011-01-13 ENCOUNTER — Encounter (INDEPENDENT_AMBULATORY_CARE_PROVIDER_SITE_OTHER): Admitting: Optometrist

## 2011-01-27 ENCOUNTER — Encounter (HOSPITAL_BASED_OUTPATIENT_CLINIC_OR_DEPARTMENT_OTHER): Payer: Self-pay | Admitting: Family

## 2011-01-30 ENCOUNTER — Ambulatory Visit (INDEPENDENT_AMBULATORY_CARE_PROVIDER_SITE_OTHER): Admitting: Optometrist

## 2011-02-09 ENCOUNTER — Other Ambulatory Visit (INDEPENDENT_AMBULATORY_CARE_PROVIDER_SITE_OTHER): Payer: Self-pay | Admitting: Gastroenterology

## 2011-02-13 ENCOUNTER — Encounter (INDEPENDENT_AMBULATORY_CARE_PROVIDER_SITE_OTHER): Payer: Self-pay | Admitting: Gastroenterology

## 2011-03-21 LAB — COMPREHENSIVE METABOLIC PANEL, BLOOD
ALT (SGPT): 34 U/L — ABNORMAL HIGH (ref 0–33)
AST (SGOT): 35 U/L — ABNORMAL HIGH (ref 0–32)
Albumin: 3.7 g/dL (ref 3.5–5.2)
Alkaline Phos: 116 U/L (ref 35–140)
BUN: 13 mg/dL (ref 6–20)
Bicarbonate: 25 mmol/L (ref 22–29)
Bilirubin, Tot: 1.6 mg/dL — ABNORMAL HIGH (ref <1.2)
Calcium: 9 mg/dL (ref 8.6–10.5)
Chloride: 109 mmol/L — ABNORMAL HIGH (ref 98–107)
Creatinine: 0.82 mg/dL (ref 0.51–0.95)
Glucose: 113 mg/dL (ref 70–115)
Potassium: 4 mmol/L (ref 3.5–5.1)
Sodium: 143 mmol/L (ref 136–145)
Total Protein: 7.4 g/dL (ref 6.0–8.0)

## 2011-03-21 LAB — HEMOGRAM, BLOOD
Hct: 36.9 % (ref 34.0–45.0)
Hgb: 13.3 gm/dL (ref 11.2–15.7)
MCH: 33.3 pg — ABNORMAL HIGH (ref 26.0–32.0)
MCHC: 36 % (ref 32.0–36.0)
MCV: 92.5 um3 (ref 79.0–95.0)
MPV: 11.4 fL (ref 9.4–12.4)
Plt Count: 46 10*3/uL — ABNORMAL LOW (ref 140–370)
RBC: 3.99 10*6/uL (ref 3.90–5.20)
RDW: 14.1 % — ABNORMAL HIGH (ref 12.0–14.0)
WBC: 3.7 10*3/uL — ABNORMAL LOW (ref 4.0–10.0)

## 2011-03-21 LAB — ALPHA FETOPROTEIN, BLOOD: AFP: 3.2 ng/mL (ref 0.0–8.3)

## 2011-03-21 LAB — BILIRUBIN, DIR BLOOD: Bilirubin, Dir: 0.4 mg/dL — ABNORMAL HIGH (ref <0.2)

## 2011-03-21 LAB — GFR: GFR: >60 mL/min

## 2011-03-21 LAB — PROTHROMBIN TIME, BLOOD
INR: 1.5
PT,Patient: 15.1 s — ABNORMAL HIGH (ref 9.7–12.5)

## 2011-03-27 ENCOUNTER — Encounter (INDEPENDENT_AMBULATORY_CARE_PROVIDER_SITE_OTHER): Admitting: Gastroenterology

## 2011-03-31 ENCOUNTER — Ambulatory Visit (INDEPENDENT_AMBULATORY_CARE_PROVIDER_SITE_OTHER): Admitting: Gastroenterology

## 2011-03-31 ENCOUNTER — Encounter (INDEPENDENT_AMBULATORY_CARE_PROVIDER_SITE_OTHER): Payer: Self-pay | Admitting: Gastroenterology

## 2011-03-31 VITALS — BP 131/59 | HR 58 | Temp 97.8°F | Resp 20 | Ht 65.0 in | Wt 128.0 lb

## 2011-03-31 MED ORDER — PROPRANOLOL HCL 10 MG OR TABS
20.00 mg | ORAL_TABLET | Freq: Three times a day (TID) | ORAL | Status: DC
Start: 2011-03-31 — End: 2011-10-17

## 2011-03-31 NOTE — Patient Instructions (Signed)
1) Schedule liver ultrasound for November 2012  2) Return in 6 months with labs before.

## 2011-03-31 NOTE — Progress Notes (Signed)
[facility] HEPATOLOGY CLINIC  ATTENDING: Leone Payor, M.D.  DATE OF SERVICE: 03/31/2011  REFERRING PROVIDER: No ref. provider found  PRIMARY CARE PROVIDER: Sherrie Mustache    REASON FOR VISIT: HCV CIRRHOSIS    Patient Active Problem List   Diagnoses Date Noted   . S/P thoracentesis 12/28/2010   . Pleural effusion associated with hepatic disorder 12/27/2010   . Proximal humerus fracture DOI: 08/11/08 [place]  08/14/2009   . Hypertension 12/12/2008   . Hyperlipidemia 12/12/2008   . Microalbuminuria 12/12/2008   . Osteoporosis 07/26/2007   . Ascites 06/29/2007     History of paracentesis x 2     . 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 Cirrhosis 06/24/2007     Removed from liver transplant waiting list due to low MELD and low likelihood of requiring a transplant in the near future.  Blood type of O positive.  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 Emphysema 06/24/2007   . Anemia 06/10/2007   . EV (esophageal varices) 06/10/2007   . Seizure Disorder 06/10/2007   . DM circ dis type II 06/10/2007   . HCV (hepatitis C virus) 06/10/2007     H/o GT2 but has achieved SVR.       Marland Kitchen HTN 06/10/2007       INTERVAL HISTORY:  The patient was last seen in this clinic in January 2012. In the interim she was admitted briefly in May 2012 for SOB and found to have a moderate pleural effusion that responded to diuretics. She now feels well without SOB or chest pain/pressure. No hepatic encephalopathy, GI bleeding or lower extremity edema.    REVIEW OF SYSTEMS:    Confusion: No  Abdominal distention: No  Melena: No  Hematemesis: No  Hematochezia: No  All other systems negative.    Current Outpatient Prescriptions   Medication Sig Dispense Refill   . spironolactone (ALDACTONE) 25 MG tablet Take 4 tablets by mouth daily.  60 tablet  0   . furosemide (LASIX) 40 MG tablet Take 1 tablet by mouth daily.  60 tablet   0   . glipiZIDE (GLUCOTROL) 10 MG tablet Take 1 tablet by mouth 2 times daily.  60 tablet  3   . insulin glargine (LANTUS) 100 UNIT/ML injection Inject 10 Units under the skin nightly.  1 Vial  2   . VITAMIN D 400 UNIT OR CAPS 1 CAPSULE DAILY  30  11   . CALCIUM CARBONATE 600 MG OR TABS 1 tablet 2 times daily  one month  11   . traMADol (ULTRAM) 50 MG tablet Take 1 tablet by mouth every 6 hours as needed for Moderate Pain (Pain Score 4-6).  30 tablet  0       ALLERGIES:   Allergies   Allergen Reactions   . Advil (Ibuprofen Micronized) Other     Intolerance           PHYSICAL EXAM:  BP 131/59  Pulse 58  Temp(Src) 97.8 F (36.6 C) (Oral)  Resp 20  Ht 5\' 5"  (1.651 m)  Wt 58.06 kg (128 lb)  BMI 21.30 kg/m2  Breastfeeding? No  General:  Alert and oriented x 3, conversant, pleasant and appropriate.  ENT: Oropharynx is clear, mucus membranes are moist.  Pulmonary:  Clear to auscultation bilaterally.  Cardiovascular:  Regular rate and rhythm. No murmurs, rubs, gallops.  GI: Abdomen soft, non-tender, non-distended.  Extremities: No lower extremity edema.      LABS: Labs were reviewed.  HEP HEPATOLOGY Latest Ref Rng 03/21/2011 12/28/2010   WBC 4.0 - 10.0 1000/mm3 3.7 (L) 3.9 (L)   SEGS 45 - 70 %     BAND 0 - 15 %     ANC 1.6 - 7.0 1000/mm3  2.9   HGB 11.2 - 15.7 gm/dL 09.8 11.9 (L)   HCT 14.7 - 45.0 % 36.9 30.7 (L)   PLT 140 - 370 1000/mm3 46 (L) 56 (L)   INR  1.5 1.5   NA 136 - 145 mmol/L 143 138   K 3.5 - 5.1 mmol/L 4.0 3.3 (L)   CL 98 - 107 mmol/L 109 (H) 104   BI 22 - 29 mmol/L 25 27   BUN 6 - 20 mg/dL 13 13   CR 8.29 - 5.62 mg/dL 1.30 8.65   GLUC 70 - 784 mg/dL 696 295 (H)   AST 0 - 32 U/L 35 (H) 31   ALT 0 - 33 U/L 34 (H) 30   TBILI <1.2 mg/dL 1.6 (H) 1.2 (H)   DBILI <0.2 mg/dL 0.4 (H) 0.3 (H)   ALK PHOS 35 - 140 U/L 116 114   GGT <30 IU/L     ALB 3.5 - 5.2 g/dL 3.7 3.3 (L)   AFP <8 ng/mL     MAG 1.8 - 2.5 mg/dL     PHOS 2.5 - 4.5 mg/dL     LDH 284 - 132 U/L     CAL 8.6 - 10.5 mg/dL 9.0 9.0   UR G:4.0-1.0 mg/dL      TSH 2.72 - 5.36 uIU/mL     T4 4.5 - 10.9 mcg/dL     CHOLESTEROL <644 mg/dL  034   TRI 10 - 742 mg/dL  95   HEP C RNA QUANT      HEP B AG        MRI 12/13/2010  IMPRESSION:    1. Nodule assessment:  Multiple stable small late-enhancing nodules without arterial phase   enhancement (LIRADS 2).     2. Other significant findings:  Cirrhosis with splenomegaly.  Severe iron overload within the liver.  Persistent nonocclusive thrombus at the portal vein confluence and extending   into the cephalad aspect of the superior mesenteric vein posteriorly    Increase in size of several cysts within the pancreas, without clear main   pancreatic ductal dilatation. These likely represent a multifocal,   predominantly side branch, benign intraductal papillary mucinous neoplasm   (IPMN). Attention to this finding can be performed on follow-up examinations.    3. Recommend routine surveillance.          EGD 01/08/2011  Endoscopic Diagnosis: 1) Trace esophageal varices   2) Moderate hiatus hernia   3) Mild PHG   4) Normal examined portion of the duodenum   Recommendations: 1) Repeat surveillance EGD in one year   2) Continue propanolol BID   3) Follow-up in hepatology clinic as previously scheduled         IMPRESSIONS: This is a pleasant 70 year old woman with HCV GT2 cirrhosis (achieved SVR) fairly well compensated. 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 She is otherwise doing well. Her MELD score is low. Proximal humerus fx 08/2009.    1) Varices screening. EGD showed no varices only mild  portal gastropathy. Repeat in May 2013. Continue propranolol 10 mg bid.  2) HCC screening. Repeat abd ultrasound in November 2012. Will alternate with MRI to assess the IPMN.  3) Fluid retention. Well controlled with lasix 40 mg daily and aldactone 50 mg daily.   4) Diabetes. On oral hypoglycemic and insulin.   5) Return to hepatology clinic in 6 months.

## 2011-04-11 ENCOUNTER — Encounter (INDEPENDENT_AMBULATORY_CARE_PROVIDER_SITE_OTHER): Payer: Self-pay | Admitting: Ophthalmology

## 2011-04-11 ENCOUNTER — Ambulatory Visit (INDEPENDENT_AMBULATORY_CARE_PROVIDER_SITE_OTHER): Admitting: Ophthalmology

## 2011-06-20 ENCOUNTER — Emergency Department
Admit: 2011-06-20 | Discharge: 2011-06-21 | Disposition: A | Discharge status: Home Health | Payer: Self-pay | Attending: Emergency Medicine | Admitting: Emergency Medicine

## 2011-06-20 ENCOUNTER — Encounter (HOSPITAL_COMMUNITY): Payer: Self-pay

## 2011-06-20 ENCOUNTER — Other Ambulatory Visit: Payer: Self-pay

## 2011-06-20 DIAGNOSIS — K469 Unspecified abdominal hernia without obstruction or gangrene: Secondary | ICD-10-CM

## 2011-06-20 DIAGNOSIS — R109 Unspecified abdominal pain: Secondary | ICD-10-CM

## 2011-06-20 LAB — LACTATE, BLOOD: Lactate: 11.5 mg/dL (ref 4.5–19.8)

## 2011-06-20 LAB — URINALYSIS
Nitrite: NEGATIVE
pH: 7 (ref 5.0–8.0)

## 2011-06-20 NOTE — ED Notes (Signed)
EKG complete, handed to dr elliott with prior ekg. Copy placed in pt chart

## 2011-06-20 NOTE — ED Notes (Signed)
Triage nurse note: pt given verbal reassurance regarding current pain level, pt given urine cup for cc urine collection, pt in wc to WR to await room placement/MD eval.

## 2011-06-20 NOTE — ED Notes (Signed)
Triage nurse note: pt to [place] as ordered.

## 2011-06-20 NOTE — ED Notes (Signed)
No answer for call back

## 2011-06-20 NOTE — ED Notes (Signed)
2130 green on ice sent to lab  -urine from mini lab split, sent to lab, extra left in frig  -pt placed onto monitors, noted sinus rhythm 97 s ectopy  -family at bedside for support

## 2011-06-20 NOTE — ED Notes (Signed)
Pt. tx to u/s via w/c.

## 2011-06-20 NOTE — ED Notes (Signed)
On 1st pt contact, awake, AAO x 4, Spanish-speaking primarily, respirations even and unlabored, lung sounds clear to auscultation, skins warm, dry, normal in color, c/o diarrhea and requests to go to the bathroom, ABD is soft and mildly tender on RUQ, denies N/V at this time, denies CP, observed to be ambulatory with steady gait requiring only mild assistance from son at bedside

## 2011-06-20 NOTE — ED Attending Note (Signed)
Chief Complaint   Patient presents with   . Abdominal Pain     Pt reports abd pain to RUQ, soft protruding mass palpated, pt states it is 'new'.  Mild low grade fevers reported by pt.   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 The pain is located in the RUQ, is accompanied by this "bump" which appeared 4 days ago, and is associated with some subjective fevers. She has been having normal bowel movements. She has had no associated vomiting, no diarrhea, no lower abdominal pain, no skin rash.    PMHx:   Past Medical History   Diagnosis Date   . Migraine headache    . Glaucoma    . Ascites 06/29/2007     History of paracentesis x 2   . 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 Cirrhosis 06/24/2007     Active on the liver transplant waiting list with a blood type of O positive. 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 Osteopenia 06/24/2007   . Emphysema 06/24/2007   . EV (esophageal varices) 06/10/2007   . Anemia 06/10/2007   . HCV (hepatitis C virus) 06/10/2007     Achieved SVR.   Marland Kitchen Type II or unspecified type diabetes mellitus with peripheral circulatory disorders, not stated as uncontrolled 06/10/2007   . HTN 06/10/2007   . HTN 06/10/2007   s/p cholecystectomy.    MEDS:   No current facility-administered medications on file prior to encounter.     Current Outpatient Prescriptions on File Prior to Encounter   Medication Sig Dispense Refill   . propranolol (INDERAL) 10 MG tablet Take 1 tablet by mouth 2 times daily.  60 tablet  0   . spironolactone (ALDACTONE) 25 MG tablet Take 4 tablets by mouth daily.  60 tablet  0   . traMADol (ULTRAM) 50 MG tablet Take 1 tablet by mouth every 6 hours as needed for Moderate Pain (Pain Score 4-6).  30 tablet  0   . furosemide (LASIX) 40 MG tablet Take 1 tablet by mouth daily.  60 tablet  0   . glipiZIDE (GLUCOTROL) 10 MG tablet Take 1 tablet by mouth 2 times daily.  60 tablet  3   . insulin glargine  (LANTUS) 100 UNIT/ML injection Inject 10 Units under the skin nightly.  1 Vial  2   . VITAMIN D 400 UNIT OR CAPS 1 CAPSULE DAILY  30  11   . CALCIUM CARBONATE 600 MG OR TABS 1 tablet 2 times daily  one month  11       ALLERGIC TO: Advil    SocHx: (-) tobacco.    FamHx: (-) diabetes.    Please see resident note for review of systems.      O:   Filed Vitals:    06/20/11 1657 06/20/11 2334   BP: 155/68 123/41   Pulse: 75 67   Temp: 99.6 F (37.6 C)    Resp: 18 17   SpO2: 97% 95%   Oxygen saturation is normal. There is initial hypertension which improves over ED observation.    GEN: alert/oriented x 3, in no acute distress, lying in a hospital gown in bed. She is Spanish-speaking only; info is gathered with the help of her son.  SKIN: warm/dry, no rashes or bruising noted over abdomen.   HEENT: Eyes: sclera anicteric, conjunctiva not injected. No periorbital ecchymoses. Mouth:  No erythema or exudate noted. Membranes moist, no blood.  NECK: No anterior or posterior cervical lymphadenopathy noted. Trachea not deviated.  CHEST: S1/S2 noted without murmurs, rubs, or gallops. There is no CVA tenderness on back.  LUNGS: Bilateral breath sounds are clear without wheezing, crackles, or rhonchi.  ABD: Flat, nondistended, no rigidity, guarding, or rebound tenderness. No Murphy's sign (patient is s/p cholecystectomy).  GU/RECTAL: Per Dr. Mechele Collin.  MUSCULOSKELETAL: Moves all four distal extremities well with full range of motion. No edema pretibially.    Labs/imaging:  Results for orders placed during the hospital encounter of 06/20/11   CBC WITH ADIFF, BLOOD       Component Value Range    WBC 9.3  4.0 - 10.0 1000/mm3    RBC 3.67 (*) 3.90 - 5.20 mill/mm3    Hgb 12.0  11.2 - 15.7 gm/dL    Hct 04.5 (*) 40.9 - 45.0 %    MCV 90.2  79.0 - 95.0 um3    MCH 32.7 (*) 26.0 - 32.0 pgm    MCHC 36.3 (*) 32.0 - 36.0 %    RDW 14.2 (*) 12.0 - 14.0 %    MPV 10.5  9.4 - 12.4 fL    Plt Count 46 (*) 140 - 370 1000/mm3    Segs 85 (*) 34 - 71 %     Lymphocytes 7 (*) 19 - 53 %    Abs Neutrophils 8.6 (*) 1.6 - 7.0 1000/mm3    Abs Lymphs 0.7 (*) 0.8 - 3.1 1000/mm3    Abs Monos <0.1 (*) 0.2 - 0.8 1000/mm3    Bands 8  0 - 15 %    Number of Cells Counted 116      Diff Type Manual      Plt Est Decreased     BASIC METABOLIC PANEL, BLOOD       Component Value Range    Glucose 193 (*) 70 - 115 mg/dL    Bun 13  6 - 20 mg/dL    Creatinine 8.11  9.14 - 0.95 mg/dL    Sodium 782 (*) 956 - 145 mmol/L    Potassium 3.7  3.5 - 5.1 mmol/L    Chloride 103  98 - 107 mmol/L    Bicarbonate 24  22 - 29 mmol/L    Calcium 8.4 (*) 8.6 - 10.5 mg/dL   LIVER PANEL, BLOOD       Component Value Range    Total Protein 6.7  6.0 - 8.0 g/dL    Albumin 3.0 (*) 3.5 - 5.2 g/dL    Bilirubin, Dir 0.5 (*) <0.2 mg/dL    Bilirubin, Tot 1.8 (*) <1.2 mg/dL    AST (SGOT) 39 (*) 0 - 32 U/L    ALT (SGPT) 38 (*) 0 - 33 U/L    Alkaline Phos 132  35 - 140 U/L   LIPASE, BLOOD       Component Value Range    Lipase 65 (*) 13 - 60 U/L   URINALYSIS       Component Value Range    Type Not Specified      Color Yellow  Yellow    Appearance Clear  Clear    Specific Gravity 1.007  1.002 - 1.030    pH 7.0  5.0 - 8.0    Protein Trace (*) Negative    Glucose Negative  Negative    Ketones Negative  Negative    Bilirubin Negative  Negative    Blood Moderate (*) Negative  Urobilinogen 0.2-1.0  0.2-1 EU/dL    Nitrite Negative  Negative    Leuk Esterase Negative  Negative    WBC 0-2  0-2/HPF    RBC 21-50 (*) 0-2/HPF    Bacteria Rare  None-Rare/HPF    Squam. Epithelial Cell Few  0-Few/HPF    Mucus Rare  None-Rare/HPF   LACTATE, BLOOD       Component Value Range    Lactate 11.5  4.5 - 19.8 mg/dL   GFR       Component Value Range    GFR >60     Gluc 193 without elevated AG; UA has RBCs but is not suggestive of infxn; tbili/AST/ALT are elevated (patient with HCV); lactate nl.    Per rads, RUQ ultrasound indicates: ESLD with sequelae of portal HTN; redemonstration of nonocclusive portal vein thrombus.    12-lead EKG (21:16): SR, HR  72, without ventricular ectopy. No ST changes. Nl axis, nl intervals.      A:   1) RUQ mass, likely hernia. As Ms. Jorde is having pain out of proportion to clinical exam, will obtain CT abdomen.  2) Hematuria. This could be related to renal stone, but is less likely given history. If CT abdomen is negative, will likely need GU follow-up.  3) Nonocclusive portal vein thrombus on ultrasound. As this is not acute, the risk:benefit ratio of starting anticoagulation seems more weighted towards the side of risk. However, Ms. Mory should follow up with her PMD. This seems unlikely as a cause of Ms. Velten symptoms.      P: If CT abdomen negative, anticipate discharge home with urology & PMD follow-up.

## 2011-06-20 NOTE — ED Notes (Signed)
Received report from Gus RN , assumed care

## 2011-06-20 NOTE — ED Notes (Signed)
2100 assisting primary RN: pt co abd pain and at the same time co she is hungry, family at bedside requesting food for her  -pt instructed NPO pending MD eval, tests  -20g IV to rac, bloods drawn and sent to lab, bc, green on ice also drawn along c large purple top which were placed in mini lab  -pt also co diarrhea, large diaper for comfort at this time  -skins warm, noted oral temp 98.7 at bedside  2115 er md now at bedside for eval

## 2011-06-21 LAB — ECG, COMPLETE (HC/~~LOC~~/ENCINITAS)
QRS INTERVAL/DURATION: 76 ms
VENTRICULAR RATE: 72 {beats}/min

## 2011-06-21 MED ORDER — ONDANSETRON HCL 4 MG OR TABS
4.0000 mg | ORAL_TABLET | Freq: Three times a day (TID) | ORAL | Status: DC | PRN
Start: 2011-06-21 — End: 2013-11-01

## 2011-06-21 MED ORDER — HYDROMORPHONE HCL 1 MG/ML IJ SOLN
1.0000 mg | Freq: Once | INTRAMUSCULAR | Status: DC
Start: 2011-06-21 — End: 2011-06-21
  Filled 2011-06-21: qty 1

## 2011-06-21 MED ORDER — HYDROCODONE-ACETAMINOPHEN 5-500 MG OR TABS
1.0000 | ORAL_TABLET | Freq: Four times a day (QID) | ORAL | Status: DC | PRN
Start: 2011-06-21 — End: 2011-10-17

## 2011-06-21 NOTE — ED Provider Notes (Signed)
History  Chief Complaint   Patient presents with   . Abdominal Pain     Pt reports abd pain to RUQ, soft protruding mass palpated, pt states it is 'new'.  Mild low grade fevers reported by pt.     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 Tender to touch. Has been having normal appetite at home but did not eat dinner last night- no nausea or vomiting. Patient has not had any fevers or chills. Hx of cholecystectomy in the past. Hx of pancreatic cysts that are small around one cm. Has never had this before. Also states she is having melanotic stools which is new, no brbpr. Has never had gi bleed however in the past.     Past Medical History   Diagnosis Date   . Migraine headache    . Glaucoma    . Ascites 06/29/2007     History of paracentesis x 2   . 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 Cirrhosis 06/24/2007     Active on the liver transplant waiting list with a blood type of O positive. 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 Osteopenia 06/24/2007   . Emphysema 06/24/2007   . EV (esophageal varices) 06/10/2007   . Anemia 06/10/2007   . HCV (hepatitis C virus) 06/10/2007     Achieved SVR.   Marland Kitchen Type II or unspecified type diabetes mellitus with peripheral circulatory disorders, not stated as uncontrolled 06/10/2007   . HTN 06/10/2007   . HTN 06/10/2007       Past Surgical History   Procedure Date   . Lap,cholecystectomy/explore 1970s     in [place];  open CCX   . Cesarean delivery only    . Revision of bladder/urethra      Bladder suspension       No family history on file.    History   Substance Use Topics   . Smoking status: Former Smoker -- 10 years   . Smokeless tobacco: Not on file   . Alcohol Use: No       Review of Systems   All other systems reviewed and are negative.        Physical Exam  BP 123/41  Pulse 67  Temp 99.6 F (37.6 C)  Resp 17  SpO2 95%    Physical Exam   Vitals  reviewed.  Constitutional: She is oriented to person, place, and time. She appears well-developed and well-nourished. No distress.   HENT:   Head: Normocephalic and atraumatic.   Mouth/Throat: Oropharynx is clear and moist.   Eyes: Conjunctivae and EOM are normal. Pupils are equal, round, and reactive to light. No scleral icterus.   Neck: Normal range of motion. Neck supple. No JVD present.   Cardiovascular: Normal rate, regular rhythm, normal heart sounds and intact distal pulses.  Exam reveals no gallop and no friction rub.    No murmur heard.  Pulmonary/Chest: Effort normal and breath sounds normal. No respiratory distress. She has no wheezes. She has no rales.   Abdominal: Soft. She exhibits no distension and no mass. There is tenderness. There is guarding. There is no rebound.        Right upper quadrant tender.    Genitourinary: Guaiac negative stool.   Musculoskeletal: Normal range of motion. She exhibits no edema.  Lymphadenopathy:     She has no cervical adenopathy.   Neurological: She is alert and oriented to person, place, and time. No cranial nerve deficit. She exhibits normal muscle tone. Coordination normal.   Skin: Skin is dry. No rash noted. She is not diaphoretic.   Psychiatric: She has a normal mood and affect.     Results for orders placed during the hospital encounter of 06/20/11   CBC WITH ADIFF, BLOOD       Component Value Range    WBC 9.3  4.0 - 10.0 1000/mm3    RBC 3.67 (*) 3.90 - 5.20 mill/mm3    Hgb 12.0  11.2 - 15.7 gm/dL    Hct 52.8 (*) 41.3 - 45.0 %    MCV 90.2  79.0 - 95.0 um3    MCH 32.7 (*) 26.0 - 32.0 pgm    MCHC 36.3 (*) 32.0 - 36.0 %    RDW 14.2 (*) 12.0 - 14.0 %    MPV 10.5  9.4 - 12.4 fL    Plt Count 46 (*) 140 - 370 1000/mm3    Segs 85 (*) 34 - 71 %    Lymphocytes 7 (*) 19 - 53 %    Abs Neutrophils 8.6 (*) 1.6 - 7.0 1000/mm3    Abs Lymphs 0.7 (*) 0.8 - 3.1 1000/mm3    Abs Monos <0.1 (*) 0.2 - 0.8 1000/mm3    Bands 8  0 - 15 %    Number of Cells Counted 116      Diff Type Manual       Plt Est Decreased     BASIC METABOLIC PANEL, BLOOD       Component Value Range    Glucose 193 (*) 70 - 115 mg/dL    Bun 13  6 - 20 mg/dL    Creatinine 2.44  0.10 - 0.95 mg/dL    Sodium 272 (*) 536 - 145 mmol/L    Potassium 3.7  3.5 - 5.1 mmol/L    Chloride 103  98 - 107 mmol/L    Bicarbonate 24  22 - 29 mmol/L    Calcium 8.4 (*) 8.6 - 10.5 mg/dL   LIVER PANEL, BLOOD       Component Value Range    Total Protein 6.7  6.0 - 8.0 g/dL    Albumin 3.0 (*) 3.5 - 5.2 g/dL    Bilirubin, Dir 0.5 (*) <0.2 mg/dL    Bilirubin, Tot 1.8 (*) <1.2 mg/dL    AST (SGOT) 39 (*) 0 - 32 U/L    ALT (SGPT) 38 (*) 0 - 33 U/L    Alkaline Phos 132  35 - 140 U/L   LIPASE, BLOOD       Component Value Range    Lipase 65 (*) 13 - 60 U/L   URINALYSIS       Component Value Range    Type Not Specified      Color Yellow  Yellow    Appearance Clear  Clear    Specific Gravity 1.007  1.002 - 1.030    pH 7.0  5.0 - 8.0    Protein Trace (*) Negative    Glucose Negative  Negative    Ketones Negative  Negative    Bilirubin Negative  Negative    Blood Moderate (*) Negative    Urobilinogen 0.2-1.0  0.2-1 EU/dL    Nitrite Negative  Negative    Leuk Esterase Negative  Negative    WBC 0-2  0-2/HPF  RBC 21-50 (*) 0-2/HPF    Bacteria Rare  None-Rare/HPF    Squam. Epithelial Cell Few  0-Few/HPF    Mucus Rare  None-Rare/HPF   LACTATE, BLOOD       Component Value Range    Lactate 11.5  4.5 - 19.8 mg/dL   GFR       Component Value Range    GFR >60         ED Course/Medical Decision Making Narrative    70 year old female with pmhx of hep C cirrhosis, pancreatic cysts here for eval ruq pain and mass.  Hx of lap chole.  Afebrile, vss.  Labs ok with nl wbc and lactate.  Urinalysis shows blood without infection.  Unclear etiology given no change in platelets which are stable.   Ultrasound did not reveal much.  Will get ct abdomen to help see cause of pain.   Suspect wall hernia/kiney stones.        Critical Care Time            Additional Notes      Home Medication  List  Prior to Admission Medications   Medication Last Dose Informant Patient Reported? Taking?   propranolol (INDERAL) 10 MG tablet   Yes No   Take 1 tablet by mouth 2 times daily.   spironolactone (ALDACTONE) 25 MG tablet   No No   Take 4 tablets by mouth daily.   traMADol (ULTRAM) 50 MG tablet   No No   Take 1 tablet by mouth every 6 hours as needed for Moderate Pain (Pain Score 4-6).   furosemide (LASIX) 40 MG tablet   No No   Take 1 tablet by mouth daily.   glipiZIDE (GLUCOTROL) 10 MG tablet   No No   Take 1 tablet by mouth 2 times daily.   insulin glargine (LANTUS) 100 UNIT/ML injection   No No   Inject 10 Units under the skin nightly.   VITAMIN D 400 UNIT OR CAPS   No No   1 CAPSULE DAILY   CALCIUM CARBONATE 600 MG OR TABS   No No   1 tablet 2 times daily          Lenoria Farrier, MD  Resident  06/21/11 [phone]

## 2011-06-21 NOTE — ED Notes (Signed)
70 yo with abdominal pain, blood in urine, pending CT abdomen. Has known pancreatic cysts. If CT with no acute pathology, plan to discharge.     Aneisha Skyles, [place], MD  Resident  06/21/11 [phone]

## 2011-06-21 NOTE — ED Notes (Signed)
Ekg: rate 54, sinus brady, axis nl, no concerning st segment changes or twi.     Lenoria Farrier, MD  Resident  06/21/11 [phone]

## 2011-06-21 NOTE — ED Notes (Signed)
Sign-out note:  Assuming care of this 70 year old female with abd pain at site of probable hernia, initial concern for incarceration.  She had CT that showed some bowel wall thickening but no comments regarding the bulge R upper abd. She can be discharged if radiology maintains perlim read.      Ernestine Conrad, MD  06/21/11 [phone]

## 2011-06-21 NOTE — ED Notes (Signed)
Pt tolerated po trial , denies nausea, pain  - MD informed

## 2011-06-21 NOTE — ED Notes (Signed)
Pt continues on monitor , alarms on/audible, family at bedside , will continue to monitor  Pt with no complaints at this time denies any needs at this time

## 2011-06-21 NOTE — ED Notes (Signed)
Pt back from ct    pt reports 1/10 abd pain , declines pain medication at this time, MD aware

## 2011-06-21 NOTE — ED Notes (Signed)
Po trial given to pt.

## 2011-06-21 NOTE — ED Notes (Signed)
Oral Temp 98.1 MD informed and temp charted

## 2011-06-21 NOTE — Discharge Instructions (Signed)
Hernia   Hernia   1.  Tiene una hernia. 1.  You have been diagnosed with a hernia.           2.  A veces hay una area dbil en la pared del abdomen ( tejido y msculo dentro de la barriga).   La hernia ocurre cuando una parte del intestino la grasa que le rodea se sobresale por esta pared. Esto crea un bulto que usted puede ver o sentir con su mano. 2.  Sometimes there is a weak spot in the abdominal wall (tissue and muscle inside the belly). A hernia occurs when a piece of intestine or surrounding fat pokes through this wall. This creates a bulge that you can see or feel with your hand.           3.  Siempre y cuando el intestino pueda empujarse hacia adentro de la hernia, no ha peligro. 3.  As long as the intestine can be pushed back inside the wall, it is not dangerous.           4.  En ocasiones, parte del intestino se sobresale a travs de la hernia y puede girarse y quedar atrapado. Esto causa un dolor severo y continuo. 4.  Occasionally, the intestine that pokes through becomes twisted or trapped. This causes severe pain that does not go away.           5.  Las hernias aparecen usualment en la ingle. Pueden aparecer tambin alrededor del ombligo o cerca de incisiones (corte) previas. Las hernias pueden aparecer de repente, o lentamente sobre un perodo de varios meses o aos. 5.  Hernias are usually in the groin. They may also occur around the (belly button) or near a previous incision. Hernias may develop very suddenly or slowly over a period of several months or years.           6.  La hernia puede causar un dolor apagado y leve. 6.  The hernia may cause a mild dull ache.           7.  Su dolor se puede tratar con medicacin. 7.  Your pain may be treated with medication.           8.  Debe limitar levantar objetos pesados, hacer esfuerzos y Quarry manager. 8.  You should limit heavy lifting, straining and pushing.           9.  Su doctor le  puede pedir que lleve una venda o braguero (tipo de faja). Esto es similar a un tipo de faja que va alrededor de su barriga. 9.  Your physician may direct you to wear a binder or truss. This is similar to a brace that fits around the belly.           10.  DEBERIA BUSCAR ATENCION MEDICA INMEDIATAMENTE, O AQUI O EN LA SALA DE EMERGENCIAS MAS CERCANA, SI TIENE ALGUNO DE ESTOS SINTOMAS: 10.  YOU SHOULD SEEK MEDICAL ATTENTION IMMEDIATELY, EITHER HERE OR AT THE NEAREST EMERGENCY DEPARTMENT, IF ANY OF THE FOLLOWING OCCURS:   Dolor que empeora o cambio en Chief Technology Officer.  * Worsening pain or a change in the pain.   La hernia sobresale y no se vuelve a meter. Si esto ocurre la hernia se puede quedar atrapada yl cortar la circulacin de [place].  * The hernia protrudes and won t go back in. If this happens, the hernia may become trapped and the blood flow may be cut off. This  is potentially a very dangerous problem that requires immediate medical attention!   Nuseas o vmitos.  * Nausea or vomiting.             Abdominal Pain    You have been diagnosed with abdominal (belly) pain. The cause of your pain is not yet known.    Many things can cause abdominal pain. Examples include viral infections and bowel (intestine) spasms. You might need another examination or more tests to find out why you have pain.    At this time, your pain does not seem to be caused by anything dangerous. You do not need surgery. You do not need to stay in the hospital.     Though we don't believe your condition is dangerous right now, it is important to be careful. Sometimes a problem that seems mild can become serious later. This is why it is very important that you return here or go to the nearest Emergency Department unless you are 100% improved.    YOU SHOULD SEEK MEDICAL ATTENTION IMMEDIATELY, EITHER HERE OR AT THE NEAREST EMERGENCY DEPARTMENT, IF ANY OF THE FOLLOWING OCCURS:   Your pain does not go away or gets worse.   You cannot  keep fluids down or your vomit is dark green.    You vomit blood or see blood in your stool. Blood might be bright red or dark red. It can also be black and look like tar.   You have a fever or shaking chills.   Your skin or eyes look yellow or your urine looks brown.   You have severe diarrhea.        Dolor abdominal   Abdominal Pain   1.  Se le ha diagnosticado dolor abdominal (de estmago). An se desconoce la causa de su dolor.  1.  You have been diagnosed with abdominal (belly) pain. The cause of your pain is not yet known.           2.  Hay muchas causas de dolor abdominal. stas van desde infecciones virales hasta espasmos intestinales. Puede que necesite otro examen peridico o ms pruebas para determinar la causa del dolor. 2.  Many things can cause abdominal pain. Examples include viral infections and bowel (intestine) spasms. You might need another examination or more tests to find out why you have pain.           3.  En este momento, parece ser que su dolor no es resultado de una condicin peligrosa. Usted no requiere de [place]. No necesita permanecer en el hospital.  3.  At this time, your pain does not seem to be caused by anything dangerous. You do not need surgery. You do not need to stay in the hospital.            4.  Aunque creemos que su condicin no es peligrosa por [address], es importante que tenga cuidado. A veces, un problema que parece leve puede convertirse en algo serio despus. Por lo Blaine Hamper, es muy importante que regrese aqu o se dirija a la Sala de Emergencias ms cercana a menos que est 100% mejor. 4.  Though we don't believe your condition is dangerous right now, it is important to be careful. Sometimes a problem that seems mild can become serious later. This is why it is very important that you return here or go to the nearest Emergency Department unless you are 100% improved.           5.  DEBE BUSCAR ATENCIN MDICA INMEDIATA, AQU O EN  LA SALA DE EMERGENCIAS MS CERCANA, SI SE PRESENTA CUALQUIERA DE LAS SIGUIENTES SITUACIONES:  5.  YOU SHOULD SEEK MEDICAL ATTENTION IMMEDIATELY, EITHER HERE OR AT THE NEAREST EMERGENCY DEPARTMENT, IF ANY OF THE FOLLOWING OCCURS:   El dolor contina o [place].   * Your pain does not go away or gets worse.   No puede retener lquidos en el estmago o su vmito es verde oscuro.   * You cannot keep fluids down or your vomit is dark green.    Vomita sangre o hay sangre en sus heces. La sangre puede ser de color rojo brillante o rojo oscuro. Tambin puede ser negra o como alquitrn.  * You vomit blood or see blood in your stool. Blood might be bright red or dark red. It can also be black and look like tar.   Tiene fiebre o escalofros.  * You have a fever or shaking chills.   Su piel o sus ojos se ven amarillos o su orina tiene Naval architect.   * Your skin or eyes look yellow or your urine looks brown.   Tiene una fuerte diarrea.  * You have severe diarrhea.

## 2011-06-24 ENCOUNTER — Telehealth (HOSPITAL_BASED_OUTPATIENT_CLINIC_OR_DEPARTMENT_OTHER): Payer: Self-pay | Admitting: Family

## 2011-06-24 NOTE — Telephone Encounter (Signed)
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 Last seen by Dr. Lissa Merlin in August with 6 months follow up. She is calling today to schedule return appointment for February. Recent visit to ER for right sided abdominal pain. CT scan showed wide mouth right lower quadrant intra-abdominal hernia containing fat. Scheduled to follow up with general surgery tomorrow. Explained to patient that her follow up appointment would be scheduled with NP. She would prefer to see Dr. Lissa Merlin in February. Will route to AA for scheduling with Dr. Lissa Merlin and mailing of appointment. Ultrasound for [facility] surveillance ordered.

## 2011-06-26 ENCOUNTER — Ambulatory Visit (HOSPITAL_BASED_OUTPATIENT_CLINIC_OR_DEPARTMENT_OTHER): Admitting: Surgery

## 2011-06-26 NOTE — Progress Notes (Signed)
General Surgery Consult    Paula Manning is a 70 year old referred after recent ED visit for RUQ pain.  Patient concerned she has hernia causing pain.  Seen in ED last week with RUQ pain/ R lower chest pain and diarrhea.  Patient with Hep C Cirrhosis.  No fever, chills.  Denies shortness of breath.  Tolerating PO, no nausea  Denies pain currently.    Past Medical History  Patient Active Problem List   Diagnoses   . Anemia   . EV (esophageal varices)   . Seizure Disorder   . DM circ dis type II   . Viral hepatitis C   . Hypertensive disorder   . Cirrhosis of liver   . Emphysema   . Ascites   . Pancreatic cyst   . Osteoporosis   . Hypertensive disorder   . Hyperlipidemia   . Microalbuminuria   . Fracture of proximal humerus   . Pleural effusion associated with hepatic disorder   . Status post thoracentesis       Past Surgical History  Past Surgical History   Procedure Date   . Lap,cholecystectomy/explore 1970s     in [place];  open CCX   . Cesarean delivery only    . Revision of bladder/urethra      Bladder suspension       Social History   Reviewed    Review of Symptoms  GEN- No fever, chills, night sweats  CV- No chest pain  Pulm- No shortness of breath, cough  GI- see HPI  GU- no dysuria  Ext- No edema    Medications  Current outpatient prescriptions:propranolol (INDERAL) 10 MG tablet, Take 1 tablet by mouth 2 times daily., Disp: 60 tablet, Rfl: 0;  spironolactone (ALDACTONE) 25 MG tablet, Take 4 tablets by mouth daily., Disp: 60 tablet, Rfl: 0;  traMADol (ULTRAM) 50 MG tablet, Take 1 tablet by mouth every 6 hours as needed for Moderate Pain (Pain Score 4-6)., Disp: 30 tablet, Rfl: 0  furosemide (LASIX) 40 MG tablet, Take 1 tablet by mouth daily., Disp: 60 tablet, Rfl: 0;  glipiZIDE (GLUCOTROL) 10 MG tablet, Take 1 tablet by mouth 2 times daily., Disp: 60 tablet, Rfl: 3;  insulin glargine (LANTUS) 100 UNIT/ML injection, Inject 10 Units under the skin nightly., Disp: 1 Vial, Rfl: 2;  VITAMIN D 400 UNIT OR CAPS,  1 CAPSULE DAILY, Disp: 30, Rfl: 11;  CALCIUM CARBONATE 600 MG OR TABS, 1 tablet 2 times daily, Disp: one month, Rfl: 11  HYDROcodone-acetaminophen (VICODIN) 5-500 MG per tablet, Take 1 tablet by mouth every 6 hours as needed for Moderate Pain (Pain Score 4-6)., Disp: 16 tablet, Rfl: 0;  ondansetron (ZOFRAN) 4 MG tablet, Take 1 tablet by mouth every 8 hours as needed for Nausea/Vomiting., Disp: 15 tablet, Rfl: 0      Allergies  Advil      Exam:  Filed Vitals:    06/26/11 0906   BP: 133/70   Pulse: 61   Temp: 97.4 F (36.3 C)   Resp: 18       A&O NAD  rrr  Unlabored breathing  Abd soft, ntnd.  Healed RUQ incision with no hernia on exam  No lower ext edema    CT Abd 06/21/11:  IMPRESSION: CT scan with IV contrast    1. Thickening of the wall of the colon, worst in the right colon, which is   likely secondary to liver disease.    2. New large right-sided pleural effusion  with associated compressive   atelectasis.    3. 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    4. Innumerable pancreatic cysts, unchanged from prior study.    Incisional hernia through internal but not external oblique        A/P:  70 year old  presents with recent episodes of RUQ abdominal pain. Small incisional hernia through internal but not external oblique on CT scan obtained 11/10.  Patient not an operative candidate due to cirrhosis.      Henriette Combs, MD    06/26/2011  10:53 AM

## 2011-06-26 NOTE — Patient Instructions (Signed)
Return to ED if severe abdominal pain, intractable nausea/vomiting, fever

## 2011-07-07 NOTE — ED Follow-up Note (Signed)
Follow-up type: Callback       Routine ED Patient Call Back    Patient contacted by telephone

## 2011-09-09 ENCOUNTER — Telehealth (HOSPITAL_BASED_OUTPATIENT_CLINIC_OR_DEPARTMENT_OTHER): Payer: Self-pay

## 2011-09-09 LAB — COMPREHENSIVE METABOLIC PANEL, BLOOD
ALT (SGPT): 34 U/L — ABNORMAL HIGH (ref 0–33)
AST (SGOT): 37 U/L — ABNORMAL HIGH (ref 0–32)
Albumin: 3.4 g/dL — ABNORMAL LOW (ref 3.5–5.2)
Alkaline Phos: 116 U/L (ref 35–140)
BUN: 15 mg/dL (ref 6–20)
Bicarbonate: 23 mmol/L (ref 22–29)
Bilirubin, Tot: 1.4 mg/dL — ABNORMAL HIGH (ref <1.2)
Calcium: 9 mg/dL (ref 8.6–10.5)
Chloride: 109 mmol/L — ABNORMAL HIGH (ref 98–107)
Creatinine: 0.77 mg/dL (ref 0.51–0.95)
Glucose: 99 mg/dL (ref 70–115)
Potassium: 4 mmol/L (ref 3.5–5.1)
Sodium: 144 mmol/L (ref 136–145)
Total Protein: 7.5 g/dL (ref 6.0–8.0)

## 2011-09-09 LAB — ALPHA FETOPROTEIN, BLOOD: AFP: 1.7 ng/mL (ref 0.0–8.3)

## 2011-09-09 LAB — CBC WITH DIFF, BLOOD
ANC-Automated: 2.2 10*3/uL (ref 1.6–7.0)
Abs Eosinophils: 0.1 10*3/uL (ref 0.0–0.5)
Abs Lymphs: 0.6 10*3/uL — ABNORMAL LOW (ref 0.8–3.1)
Abs Monos: 0.3 10*3/uL (ref 0.2–0.8)
Eosinophils: 2 % (ref 1–7)
Hct: 36 % (ref 34.0–45.0)
Hgb: 12.6 gm/dL (ref 11.2–15.7)
Lymphocytes: 19 % (ref 19–53)
MCH: 32.2 pg — ABNORMAL HIGH (ref 26.0–32.0)
MCHC: 35 % (ref 32.0–36.0)
MCV: 92.1 um3 (ref 79.0–95.0)
MPV: 11.1 fL (ref 9.4–12.4)
Monocytes: 8 % (ref 5–12)
Plt Count: 43 10*3/uL — ABNORMAL LOW (ref 140–370)
Plt Est: DECREASED
RBC: 3.91 10*6/uL (ref 3.90–5.20)
RDW: 14.5 % — ABNORMAL HIGH (ref 12.0–14.0)
Segs: 71 % (ref 34–71)
WBC: 3.1 10*3/uL — ABNORMAL LOW (ref 4.0–10.0)

## 2011-09-09 LAB — PROTHROMBIN TIME, BLOOD
INR: 1.4
PT,Patient: 14.6 s — ABNORMAL HIGH (ref 9.7–12.5)

## 2011-09-09 LAB — BILIRUBIN, DIR BLOOD: Bilirubin, Dir: 0.4 mg/dL — ABNORMAL HIGH (ref <0.2)

## 2011-09-09 LAB — GFR: GFR: >60 mL/min

## 2011-09-17 ENCOUNTER — Ambulatory Visit (INDEPENDENT_AMBULATORY_CARE_PROVIDER_SITE_OTHER): Admitting: Gastroenterology

## 2011-09-17 VITALS — BP 130/52 | HR 63 | Temp 97.7°F | Resp 12 | Ht 63.0 in | Wt 132.0 lb

## 2011-09-17 MED ORDER — RIFAXIMIN 550 MG PO TABS: 550.00 mg | ORAL_TABLET | Freq: Two times a day (BID) | ORAL | Status: AC

## 2011-10-15 ENCOUNTER — Other Ambulatory Visit: Payer: Self-pay

## 2011-10-15 ENCOUNTER — Inpatient Hospital Stay
Admission: AD | Admit: 2011-10-15 | Discharge: 2011-10-17 | Disposition: A | Discharge status: Home Health | Payer: Self-pay | Attending: Surgery | Admitting: Surgery

## 2011-10-15 ENCOUNTER — Encounter (HOSPITAL_COMMUNITY): Payer: Self-pay

## 2011-10-15 DIAGNOSIS — I10 Essential (primary) hypertension: Secondary | ICD-10-CM | POA: Insufficient documentation

## 2011-10-15 DIAGNOSIS — K7460 Unspecified cirrhosis of liver: Secondary | ICD-10-CM | POA: Insufficient documentation

## 2011-10-15 DIAGNOSIS — R188 Other ascites: Secondary | ICD-10-CM

## 2011-10-15 DIAGNOSIS — M79641 Pain in right hand: Secondary | ICD-10-CM

## 2011-10-15 DIAGNOSIS — I8500 Esophageal varices without bleeding: Secondary | ICD-10-CM | POA: Insufficient documentation

## 2011-10-15 DIAGNOSIS — S022XXA Fracture of nasal bones, initial encounter for closed fracture: Secondary | ICD-10-CM | POA: Diagnosis present

## 2011-10-15 DIAGNOSIS — R55 Syncope and collapse: Secondary | ICD-10-CM | POA: Diagnosis present

## 2011-10-15 DIAGNOSIS — R109 Unspecified abdominal pain: Secondary | ICD-10-CM

## 2011-10-15 DIAGNOSIS — E1151 Type 2 diabetes mellitus with diabetic peripheral angiopathy without gangrene: Secondary | ICD-10-CM | POA: Diagnosis present

## 2011-10-15 MED ORDER — MORPHINE SULFATE 4 MG/ML IJ SOLN
4.0000 mg | Freq: Once | INTRAMUSCULAR | Status: DC
Start: 2011-10-15 — End: 2011-10-15

## 2011-10-15 MED ORDER — DEXTROSE (DIABETIC USE) 40 % OR GEL
1.0000 | ORAL | Status: DC | PRN
Start: 2011-10-15 — End: 2011-10-17

## 2011-10-15 MED ORDER — GLUCOSE 4 GM PO CHEW (CUSTOM)
4.0000 | CHEWABLE_TABLET | ORAL | Status: DC | PRN
Start: 2011-10-15 — End: 2011-10-17

## 2011-10-15 MED ORDER — SODIUM CHLORIDE 0.9 % IV SOLN
INTRAVENOUS | Status: DC
Start: 2011-10-15 — End: 2011-10-16

## 2011-10-15 MED ORDER — PROPRANOLOL HCL 20 MG OR TABS
20.0000 mg | ORAL_TABLET | Freq: Three times a day (TID) | ORAL | Status: DC
Start: 2011-10-15 — End: 2011-10-16
  Filled 2011-10-15: qty 1

## 2011-10-15 MED ORDER — DIPHENHYDRAMINE HCL 25 MG OR TABS OR CAPS CUSTOM
25.0000 mg | ORAL_CAPSULE | Freq: Once | ORAL | Status: DC
Start: 2011-10-15 — End: 2011-10-15

## 2011-10-15 MED ORDER — RIFAXIMIN 550 MG PO TABS
550.0000 mg | ORAL_TABLET | Freq: Two times a day (BID) | ORAL | Status: DC
Start: 2011-10-15 — End: 2011-10-17
  Filled 2011-10-15 (×4): qty 1

## 2011-10-15 MED ORDER — ACETAMINOPHEN 325 MG PO TABS
650.0000 mg | ORAL_TABLET | Freq: Four times a day (QID) | ORAL | Status: DC | PRN
Start: 2011-10-15 — End: 2011-10-16
  Administered 2011-10-15 – 2011-10-16 (×2): 650 mg via ORAL
  Filled 2011-10-15 (×2): qty 2

## 2011-10-15 MED ORDER — CEPHALEXIN 500 MG OR CAPS
500.0000 mg | ORAL_CAPSULE | Freq: Four times a day (QID) | ORAL | Status: DC
Start: 2011-10-15 — End: 2011-10-17
  Filled 2011-10-15 (×8): qty 1

## 2011-10-15 MED ORDER — BACITRACIN 500 UNIT/GM EX OINT (CUSTOM)
TOPICAL_OINTMENT | Freq: Two times a day (BID) | CUTANEOUS | Status: DC
Start: 2011-10-16 — End: 2011-10-17
  Filled 2011-10-15: qty 28.35

## 2011-10-15 MED ORDER — GLUCAGON HCL (RDNA) 1 MG IJ SOLR
1.0000 mg | Freq: Once | INTRAMUSCULAR | Status: DC | PRN
Start: 2011-10-15 — End: 2011-10-17

## 2011-10-15 MED ORDER — BACITRACIN 500 UNIT/GM EX OINT (CUSTOM)
TOPICAL_OINTMENT | Freq: Once | CUTANEOUS | Status: DC
Start: 2011-10-15 — End: 2011-10-15
  Filled 2011-10-15: qty 0.9

## 2011-10-15 MED ORDER — INSULIN GLARGINE 100 UNIT/ML SC SOLN
10.0000 [IU] | Freq: Every evening | SUBCUTANEOUS | Status: DC
Start: 2011-10-15 — End: 2011-10-17
  Administered 2011-10-15: 10 [IU] via SUBCUTANEOUS
  Filled 2011-10-15 (×2): qty 10

## 2011-10-15 MED ORDER — FUROSEMIDE 20 MG OR TABS
40.0000 mg | ORAL_TABLET | Freq: Every day | ORAL | Status: DC
Start: 2011-10-16 — End: 2011-10-16

## 2011-10-15 MED ORDER — VITAMIN D 400 UNIT OR TABS
800.0000 [IU] | ORAL_TABLET | Freq: Every day | ORAL | Status: DC
Start: 2011-10-16 — End: 2011-10-17
  Filled 2011-10-15 (×2): qty 2

## 2011-10-15 MED ORDER — DEXTROSE 50 % IV SOLN
12.5000 g | INTRAVENOUS | Status: DC | PRN
Start: 2011-10-15 — End: 2011-10-17

## 2011-10-15 MED ORDER — INSULIN REGULAR HUMAN 100 UNIT/ML IJ SOLN
1.0000 [IU] | Freq: Four times a day (QID) | INTRAMUSCULAR | Status: DC
Start: 2011-10-15 — End: 2011-10-16
  Filled 2011-10-15: qty 3
  Filled 2011-10-15: qty 1

## 2011-10-15 MED ORDER — LIDOCAINE HCL 1 % IJ SOLN
10.00 mL | Freq: Once | INTRAMUSCULAR | Status: AC
Start: 2011-10-15 — End: 2011-10-15
  Filled 2011-10-15: qty 10

## 2011-10-15 MED ORDER — ACETAMINOPHEN 325 MG PO TABS
650.0000 mg | ORAL_TABLET | Freq: Once | ORAL | Status: DC
Start: 2011-10-15 — End: 2011-10-15

## 2011-10-15 MED ORDER — SPIRONOLACTONE 100 MG OR TABS
100.0000 mg | ORAL_TABLET | Freq: Every day | ORAL | Status: DC
Start: 2011-10-16 — End: 2011-10-16

## 2011-10-15 MED ORDER — MORPHINE SULFATE 2 MG/ML IJ SOLN
2.0000 mg | INTRAMUSCULAR | Status: DC | PRN
Start: 2011-10-15 — End: 2011-10-16

## 2011-10-15 MED ORDER — MORPHINE SULFATE 4 MG/ML IJ SOLN
4.0000 mg | INTRAMUSCULAR | Status: DC | PRN
Start: 2011-10-15 — End: 2011-10-16

## 2011-10-16 LAB — ECG, COMPLETE (HC/~~LOC~~/ENCINITAS)
QRS INTERVAL/DURATION: 74 ms
VENTRICULAR RATE: 74 {beats}/min

## 2011-10-16 MED ORDER — HYDROMORPHONE HCL 1 MG/ML IJ SOLN
0.3000 mg | INTRAMUSCULAR | Status: DC | PRN
Start: 2011-10-16 — End: 2011-10-17
  Administered 2011-10-16: 0.3 mg via INTRAVENOUS

## 2011-10-16 MED ORDER — ACETAMINOPHEN 325 MG PO TABS
650.0000 mg | ORAL_TABLET | Freq: Four times a day (QID) | ORAL | Status: DC | PRN
Start: 2011-10-16 — End: 2011-10-17
  Administered 2011-10-16: 650 mg via ORAL
  Filled 2011-10-16: qty 2

## 2011-10-16 MED ORDER — INSULIN LISPRO (HUMAN) 100 UNIT/ML SC SOLN (CUSTOM)
1.0000 [IU] | Freq: Four times a day (QID) | INTRAMUSCULAR | Status: DC
Start: 2011-10-16 — End: 2011-10-17
  Filled 2011-10-16 (×2): qty 2
  Filled 2011-10-16: qty 1
  Filled 2011-10-16: qty 2
  Filled 2011-10-16: qty 1

## 2011-10-16 MED ORDER — SPIRONOLACTONE 25 MG OR TABS
50.0000 mg | ORAL_TABLET | Freq: Every day | ORAL | Status: DC
Start: 2011-10-17 — End: 2011-10-17
  Filled 2011-10-16 (×2): qty 2

## 2011-10-16 MED ORDER — POTASSIUM CHLORIDE CRYS CR 10 MEQ OR TBCR
40.00 meq | EXTENDED_RELEASE_TABLET | Freq: Once | ORAL | Status: AC
Start: 2011-10-16 — End: 2011-10-16
  Filled 2011-10-16: qty 4

## 2011-10-16 MED ORDER — CALCIUM CARBONATE ANTACID 500 MG OR CHEW
500.00 mg | CHEWABLE_TABLET | Freq: Once | ORAL | Status: AC
Start: 2011-10-16 — End: 2011-10-16
  Filled 2011-10-16: qty 1

## 2011-10-17 ENCOUNTER — Encounter (HOSPITAL_BASED_OUTPATIENT_CLINIC_OR_DEPARTMENT_OTHER): Payer: Self-pay | Admitting: Surgery

## 2011-10-17 ENCOUNTER — Encounter (HOSPITAL_BASED_OUTPATIENT_CLINIC_OR_DEPARTMENT_OTHER): Payer: Self-pay

## 2011-10-17 ENCOUNTER — Encounter (INDEPENDENT_AMBULATORY_CARE_PROVIDER_SITE_OTHER): Payer: Self-pay | Admitting: Emergency Medicine

## 2011-10-17 DIAGNOSIS — S022XXA Fracture of nasal bones, initial encounter for closed fracture: Secondary | ICD-10-CM | POA: Diagnosis present

## 2011-10-17 DIAGNOSIS — R55 Syncope and collapse: Secondary | ICD-10-CM | POA: Diagnosis present

## 2011-10-17 MED ORDER — SPIRONOLACTONE 25 MG OR TABS
50.0000 mg | ORAL_TABLET | Freq: Every day | ORAL | Status: DC
Start: 2011-10-17 — End: 2012-12-14

## 2011-10-17 MED ORDER — PROPRANOLOL HCL 10 MG OR TABS
10.0000 mg | ORAL_TABLET | Freq: Three times a day (TID) | ORAL | Status: DC
Start: 2011-10-17 — End: 2011-10-17

## 2011-10-17 MED ORDER — FUROSEMIDE 20 MG OR TABS
20.0000 mg | ORAL_TABLET | Freq: Every day | ORAL | Status: DC
Start: 2011-10-17 — End: 2011-10-17

## 2011-10-17 MED ORDER — CEPHALEXIN 500 MG OR CAPS
500.0000 mg | ORAL_CAPSULE | Freq: Four times a day (QID) | ORAL | Status: DC
Start: 2011-10-17 — End: 2011-10-17

## 2011-10-17 MED ORDER — SPIRONOLACTONE 25 MG OR TABS
50.0000 mg | ORAL_TABLET | Freq: Every day | ORAL | Status: DC
Start: 2011-10-17 — End: 2011-10-17

## 2011-10-17 MED ORDER — HYDROCODONE-ACETAMINOPHEN 5-500 MG OR TABS
1.0000 | ORAL_TABLET | Freq: Four times a day (QID) | ORAL | Status: DC | PRN
Start: 2011-10-17 — End: 2011-10-17

## 2011-10-17 MED ORDER — CEPHALEXIN 500 MG OR CAPS
500.00 mg | ORAL_CAPSULE | Freq: Four times a day (QID) | ORAL | Status: AC
Start: 2011-10-17 — End: 2011-10-22

## 2011-10-17 MED ORDER — INSULIN LISPRO (HUMAN) 100 UNIT/ML SC SOLN (CUSTOM)
3.0000 [IU] | Freq: Three times a day (TID) | INTRAMUSCULAR | Status: DC
Start: 2011-10-17 — End: 2011-10-17

## 2011-10-17 MED ORDER — BACITRACIN 500 UNIT/GM EX OINT (CUSTOM)
TOPICAL_OINTMENT | CUTANEOUS | Status: DC
Start: 2011-10-17 — End: 2011-10-17

## 2011-10-17 MED ORDER — FUROSEMIDE 20 MG OR TABS
20.0000 mg | ORAL_TABLET | Freq: Every day | ORAL | Status: DC
Start: 2011-10-17 — End: 2012-12-14

## 2011-10-17 MED ORDER — HYDROCODONE-ACETAMINOPHEN 5-500 MG OR TABS
1.0000 | ORAL_TABLET | Freq: Four times a day (QID) | ORAL | Status: DC | PRN
Start: 2011-10-17 — End: 2012-12-14

## 2011-10-17 MED ORDER — BACITRACIN 500 UNIT/GM EX OINT
TOPICAL_OINTMENT | CUTANEOUS | Status: AC
Start: 2011-10-17 — End: ?

## 2011-10-17 MED ORDER — INFLUENZA VAC SPLIT HIGH-DOSE 0.5 ML IM SUSY
0.5000 mL | PREFILLED_SYRINGE | INTRAMUSCULAR | Status: DC
Start: 2011-10-17 — End: 2011-10-17
  Filled 2011-10-17: qty 0.5

## 2011-10-17 MED ORDER — PNEUMOCOCCAL VAC POLYVALENT 25 MCG/0.5ML IJ INJ (CUSTOM)
0.5000 mL | INJECTION | INTRAMUSCULAR | Status: DC
Start: 2011-10-17 — End: 2011-10-17

## 2011-10-17 MED ORDER — PROPRANOLOL HCL 10 MG OR TABS
10.0000 mg | ORAL_TABLET | Freq: Three times a day (TID) | ORAL | Status: DC
Start: 2011-10-17 — End: 2012-12-14

## 2011-10-21 ENCOUNTER — Telehealth (HOSPITAL_BASED_OUTPATIENT_CLINIC_OR_DEPARTMENT_OTHER): Payer: Self-pay | Admitting: Otolaryngology

## 2011-10-21 NOTE — Telephone Encounter (Signed)
Pt called to request an appointment for a follow up. Pt was discharged for a nasal fracture, pt needs to be seen within 7-10 days. Please advise

## 2011-10-22 ENCOUNTER — Ambulatory Visit (HOSPITAL_BASED_OUTPATIENT_CLINIC_OR_DEPARTMENT_OTHER): Admitting: Otolaryngology

## 2011-10-22 ENCOUNTER — Encounter (HOSPITAL_BASED_OUTPATIENT_CLINIC_OR_DEPARTMENT_OTHER): Payer: Self-pay | Admitting: Otolaryngology

## 2011-10-22 VITALS — BP 135/63 | HR 72 | Temp 97.2°F | Resp 16

## 2011-10-22 NOTE — Patient Instructions (Signed)
You can remove the plastic cast on the nose and band-aids underneath in 1 week.   Apply antibiotic ointment to the wounds 3 times daily.

## 2011-10-23 NOTE — Progress Notes (Signed)
I have taken the history and performed a comprehensive ENT examination. The past medical history, social history, and medications were reviewed with the patient. A 10 point review of systems was conducted and pertinent positives included in the history. Over 50% of the visit time was spent talking with the patient and coordinating care.     The following is a dictation summarizing my impression and plans.     Alanie Syler, MD  Otolaryngology / Head and Neck Surgery

## 2011-10-23 NOTE — Progress Notes (Signed)
CLINIC: [facility] ENT    REPORT TYPE: NOTE    Dictating Practitioner: Smitty Knudsen, M.D.    DATE OF SERVICE:  10/23/2011    REASON FOR VISIT: NASAL FRACTURE        Patient:  Paula Manning, Paula Manning  MR#: [id number]  DOS:  10/23/2011  DOB:  05-20-1941  [name] is here for evaluation of nasal fracture.  It sounds like she  suffered from a presyncopal event while out on a walk last week and fell  onto her face.  She was seen in the Emergency Department.  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  She thus  required nasal packing after the injury.  A CT scan revealed a displaced  nasal bone fracture.  She has been slowly recovering since the injury.  She  has no further bleeding from the nose.  Her airway is improving although  she still has some obstruction.  She does note the change in the appearance  of her nasal dorsum.  She denies any prior nasal surgery.    PHYSICAL EXAMINATION  HEENT:  There is a significant amount of ecchymoses and bruising throughout  the face and extending into the neck.  She has a laceration in the midline  scalp that has sutures in place.  There is also a smaller laceration over  the bony nasal dorsum.  The sutures are removed.  There is obvious  displacement of the nasal bones to the left.  There is no other bony  step-off appreciated.  The nasal septum has a complex deviation that  appears old.  There is no sign of acute septal fracture or hematoma.    PROCEDURE:  After discussion and consent, the patient elected to proceed  with closed reduction of the nasal fracture.  Topical decongestant and  anesthetic was sprayed in the nose and anesthetic saturated cotton pledgets  were placed in the nasal cavity.  Further anesthesia was provided by  intraorbital nerve block.  After allowing sufficient time the nasal bones  were then reduced back to the midline with good result.  Steri-Strips and a  Thermoplastic splint were applied.  The manipulation did trigger a small  amount  of bleeding that was controlled with some further Afrin spray and  icing of the nose.  The patient was observed for about 30 minutes to ensure  the bleeding had ceased.    ASSESSMENT AND PLAN:  Ms. Timmons had undergone a significant nasal  fracture.  Closed reduction was performed today with good result.  They  were instructed they could remove the plastic splint and Steri-Strips in  about 1 week.  She can follow up as needed for concerns.  External CCs -------- > > >  (do NOT remove @ @ signs)                        Electronically signed by:  Smitty Knudsen, M.D. 10/30/2011 05:01 P          DD: 10/23/2011    DT: 10/23/2011 11:19 A   DocNo.: [id number]  [id number]                 [id number]      Primary Care Physician:  Rene Paci M.D.  [address] [place], [place] [address]    cc:

## 2011-10-29 ENCOUNTER — Encounter (HOSPITAL_BASED_OUTPATIENT_CLINIC_OR_DEPARTMENT_OTHER): Payer: Self-pay | Admitting: Orthopaedic Surgery

## 2011-10-30 ENCOUNTER — Institutional Professional Consult (permissible substitution) (HOSPITAL_BASED_OUTPATIENT_CLINIC_OR_DEPARTMENT_OTHER): Admitting: Orthopaedic Surgery

## 2011-10-30 VITALS — BP 130/64 | HR 70 | Temp 97.9°F

## 2011-11-04 ENCOUNTER — Encounter (INDEPENDENT_AMBULATORY_CARE_PROVIDER_SITE_OTHER): Payer: Self-pay | Admitting: Internal Medicine

## 2011-11-09 LAB — ECG, COMPLETE (HC/~~LOC~~/ENCINITAS)
QRS INTERVAL/DURATION: 78 ms
VENTRICULAR RATE: 61 {beats}/min

## 2011-11-14 ENCOUNTER — Encounter (INDEPENDENT_AMBULATORY_CARE_PROVIDER_SITE_OTHER): Payer: Self-pay

## 2011-11-14 ENCOUNTER — Encounter (INDEPENDENT_AMBULATORY_CARE_PROVIDER_SITE_OTHER): Payer: Self-pay | Admitting: Ophthalmology

## 2011-11-28 ENCOUNTER — Ambulatory Visit (INDEPENDENT_AMBULATORY_CARE_PROVIDER_SITE_OTHER)

## 2011-11-28 ENCOUNTER — Ambulatory Visit (INDEPENDENT_AMBULATORY_CARE_PROVIDER_SITE_OTHER): Admitting: Ophthalmology

## 2011-12-17 ENCOUNTER — Ambulatory Visit
Admit: 2011-12-17 | Discharge: 2011-12-20 | Payer: Self-pay | Source: Ambulatory Visit | Attending: Internal Medicine | Admitting: Internal Medicine

## 2011-12-17 NOTE — Procedures (Signed)
Report Author:  Geroge Baseman. Yvone Neu, M.D.    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 EGD in 12/2010 with trace  esophageal varices.    INSTRUMENTS: 409    MEDICATIONS: Fentanyl 50 mcg IVP, Versed 3 mg IVP    PROCEDURE TECHNIQUE: A physical exam was  performed. Informed consent was obtained from the patient  after explaining all the risks (perforation, bleeding, infection,  adverse effects to the medicine, and tooth damage),  benefits and alternatives to the procedure which the patient  appeared to understand and so stated.  The patient was  connected to the monitoring devices and placed in the left  lateral position. Continuous oxygen was provided with a  nasal cannula and IV medicine administered through a  indwelling cannula. After adequate conscious sedation was  achieved, the patient was intubated and the scope  advanced under direct visualization to the second part of  duodenum.  The scope was subsequently removed slowly  while carefully examining the color, texture, anatomy, and  integrity of the mucosa on the way out. The patient was  subsequently transferred to the recovery area in satisfactory  condition.      ESTIMATED BLOOD LOSS: None. SPECIMEN:No  Biopsies Taken    Findings: Small distal esophageal varices. Moderate portal  hypertensive gastropathy, with scant heme material in  stomach. No gastric varices. Small hiatal hernia. Normal  duodenum.    Endoscopic Diagnosis: 1. Small distal esophageal varices.  2. Moderate portal hypertensive gastropathy.  3. Small hiatal hernia.    Recommendations: 1. Continue beta blocker  (Inderal/propranolol).  2. Repeat EGD in 1-2 years for variceal surveillance.  3. Further management per outpatient  hepatologist.            Electronically signed by:  Geroge Baseman. Yvone Neu, M.D. 12/26/2011 11:53 A          DD: 12/17/2011    DT:  12/17/2011 08:03 A  DocNo.:  [id number]  HMP/bsm    Referring Physician:  Rene Paci M.D.  [address]  [place] [address]    Primary Care Physician:  Rene Paci M.D.  [address]  [place], [place] [address]    cc:

## 2011-12-17 NOTE — Discharge Instructions (Signed)
GI DC instructions verified; correct copy to pt and son

## 2011-12-18 LAB — GLUCOSE POCT, BLOOD: Meter Glucose (POC): 188 mg/dL — ABNORMAL HIGH (ref 70–115)

## 2012-01-16 ENCOUNTER — Telehealth (HOSPITAL_BASED_OUTPATIENT_CLINIC_OR_DEPARTMENT_OTHER): Payer: Self-pay | Admitting: Family

## 2012-01-16 NOTE — Telephone Encounter (Signed)
Pls enter orders for pts upcoming MRI on 6/18 and f/u on 7/10

## 2012-01-16 NOTE — Telephone Encounter (Signed)
Orders signed.

## 2012-01-16 NOTE — Telephone Encounter (Signed)
Will pend MELD labs and AFP.      Will route to NP for order review and signature.

## 2012-01-20 ENCOUNTER — Other Ambulatory Visit (HOSPITAL_BASED_OUTPATIENT_CLINIC_OR_DEPARTMENT_OTHER): Payer: Self-pay | Admitting: Family

## 2012-01-20 ENCOUNTER — Telehealth (HOSPITAL_BASED_OUTPATIENT_CLINIC_OR_DEPARTMENT_OTHER): Payer: Self-pay | Admitting: Family

## 2012-01-20 NOTE — Telephone Encounter (Signed)
Received voicemail from patient with question regarding time/date of upcoming appointment in NP clinic. Message left for patient with appointment time/date - 02/18/12 at 1000.

## 2012-02-11 ENCOUNTER — Other Ambulatory Visit (HOSPITAL_BASED_OUTPATIENT_CLINIC_OR_DEPARTMENT_OTHER): Payer: Self-pay

## 2012-02-11 ENCOUNTER — Telehealth (HOSPITAL_BASED_OUTPATIENT_CLINIC_OR_DEPARTMENT_OTHER): Payer: Self-pay

## 2012-02-11 LAB — COMPREHENSIVE METABOLIC PANEL, BLOOD
ALT (SGPT): 22 U/L (ref 0–33)
AST (SGOT): 32 U/L (ref 0–32)
Albumin: 3.4 g/dL — ABNORMAL LOW (ref 3.5–5.2)
Alkaline Phos: 118 U/L (ref 35–140)
BUN: 14 mg/dL (ref 6–20)
Bicarbonate: 26 mmol/L (ref 22–29)
Bilirubin, Tot: 0.9 mg/dL (ref <1.2)
Calcium: 8.9 mg/dL (ref 8.6–10.5)
Chloride: 108 mmol/L — ABNORMAL HIGH (ref 98–107)
Creatinine: 0.87 mg/dL (ref 0.51–0.95)
GFR: >60 mL/min
Glucose: 117 mg/dL — ABNORMAL HIGH (ref 70–115)
Potassium: 4.2 mmol/L (ref 3.5–5.1)
Sodium: 143 mmol/L (ref 136–145)
Total Protein: 7.3 g/dL (ref 6.0–8.0)

## 2012-02-11 LAB — CBC WITH DIFF, BLOOD
ANC-Automated: 1.8 10*3/uL (ref 1.6–7.0)
Abs Lymphs: 0.6 10*3/uL — ABNORMAL LOW (ref 0.8–3.1)
Abs Monos: 0.3 10*3/uL (ref 0.2–0.8)
Eosinophils: 1 % (ref 1–7)
Hct: 32.9 % — ABNORMAL LOW (ref 34.0–45.0)
Hgb: 11.7 gm/dL (ref 11.2–15.7)
Lymphocytes: 23 % (ref 19–53)
MCH: 32.4 pg — ABNORMAL HIGH (ref 26.0–32.0)
MCHC: 35.6 % (ref 32.0–36.0)
MCV: 91.1 um3 (ref 79.0–95.0)
MPV: 10.1 fL (ref 9.4–12.4)
Monocytes: 9 % (ref 5–12)
Plt Count: 42 10*3/uL — ABNORMAL LOW (ref 140–370)
RBC: 3.61 10*6/uL — ABNORMAL LOW (ref 3.90–5.20)
RDW: 15.5 % — ABNORMAL HIGH (ref 12.0–14.0)
Segs: 66 % (ref 34–71)
WBC: 2.8 10*3/uL — ABNORMAL LOW (ref 4.0–10.0)

## 2012-02-11 LAB — PROTHROMBIN TIME, BLOOD
INR: 1.5
PT,Patient: 16.4 s — ABNORMAL HIGH (ref 9.7–12.5)

## 2012-02-11 LAB — BILIRUBIN, DIR BLOOD: Bilirubin, Dir: 0.3 mg/dL — ABNORMAL HIGH (ref <0.2)

## 2012-02-11 NOTE — Telephone Encounter (Signed)
Pt in lab.  Needs orders (none in system).  Appt 02/18/12.

## 2012-02-13 ENCOUNTER — Ambulatory Visit (INDEPENDENT_AMBULATORY_CARE_PROVIDER_SITE_OTHER): Admitting: Ophthalmology

## 2012-02-18 ENCOUNTER — Ambulatory Visit (INDEPENDENT_AMBULATORY_CARE_PROVIDER_SITE_OTHER): Admitting: Family

## 2012-02-18 VITALS — BP 143/58 | HR 75 | Temp 97.6°F | Resp 16 | Ht 65.0 in | Wt 126.0 lb

## 2012-02-18 NOTE — Patient Instructions (Addendum)
1. You may take a multi-vitamin daily.    2. Please call [phone] to schedule an MRI with Gadovist ( contrast).     3. Please follow up with your primary care doctor.    4. Return to clinic in 6 months with NP.

## 2012-02-18 NOTE — Progress Notes (Signed)
CLINIC: [facility] HEPATOLOGY  Visit Practitioner: Darlin Drop, NP  Referring Physician:Alexander Lissa Merlin, MD  DATE OF SERVICE: 02/18/2012    [facility]PATOLOGY [facility]    REASON FOR VISIT: Cirrhosis    Patient Active Problem List    Diagnosis Date Noted   . Nasal bone fx-closed 10/17/2011   . Syncope 10/17/2011   . Status post thoracentesis 12/28/2010   . Pleural effusion associated with hepatic disorder 12/27/2010   . Fracture of proximal humerus 08/14/2009   . Hypertensive disorder 12/12/2008   . Hyperlipidemia 12/12/2008   . Microalbuminuria 12/12/2008   . Osteoporosis 07/26/2007   . Ascites 06/29/2007     History of paracentesis x 2     . 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 Cirrhosis of liver 06/24/2007     Removed from liver transplant waiting list due to low MELD and low likelihood of requiring a transplant in the near future.  Blood type of O positive.  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 Emphysema 06/24/2007   . Anemia 06/10/2007   . EV (esophageal varices) 06/10/2007   . Seizure Disorder 06/10/2007   . DM circ dis type II 06/10/2007   . Viral hepatitis C 06/10/2007     H/o GT2 but has achieved SVR.       Marland Kitchen Hypertensive disorder 06/10/2007       INTERVAL HISTORY:  The patient was last seen in this clinic by Dr. Lissa Merlin on February. In the interim the patient was seen in the emergency room in March due to a pre-syncopal fall resulting in displaced comminuted nasal fracture and bony septal fracture. Head CT negative. Complaining of weakness in LE bilaterally. C/o fatigue. Had an  MRI last month for [facility] surveillance however poor arterial phase study due to poor IV access. Denies melena, hematochezia, hematemesis, periods of confused thinking. Accompanied by son and spouse.  Head CT negative.   REVIEW OF SYSTEMS:  As described above.  All other systems are negative.    Current Outpatient  Prescriptions   Medication Sig Dispense Refill   . bacitracin 500 UNIT/GM ointment Apply small amount to affected area on face twice daily  1 Tube  1   . CALCIUM CARBONATE 600 MG OR TABS 1 tablet 2 times daily  one month  11   . furosemide (LASIX) 20 MG tablet Take 1 tablet by mouth daily.  60 tablet  0   . glipiZIDE (GLUCOTROL) 10 MG tablet Take 1 tablet by mouth 2 times daily.  60 tablet  3   . HYDROcodone-acetaminophen (VICODIN) 5-500 MG per tablet Take 1 tablet by mouth every 6 hours as needed for Moderate Pain (Pain Score 4-6) or Severe Pain (Pain Score 7-10).  30 tablet  0   . insulin glargine (LANTUS) 100 UNIT/ML injection Inject 10 Units under the skin nightly.  1 Vial  2   . ondansetron (ZOFRAN) 4 MG tablet Take 1 tablet by mouth every 8 hours as needed for Nausea/Vomiting.  15 tablet  0   . propranolol (INDERAL) 10 MG tablet Take 1 tablet by mouth 3 times daily.  60 tablet  0   . rifaximin (XIFAXAN) 550 MG tablet Take 550 mg by mouth every 12 hours.       Marland Kitchen spironolactone (ALDACTONE) 25 MG tablet Take 2 tablets by  mouth daily.  60 tablet  0   . traMADol (ULTRAM) 50 MG tablet Take 1 tablet by mouth every 6 hours as needed for Moderate Pain (Pain Score 4-6).  30 tablet  0   . VITAMIN D 400 UNIT OR CAPS 1 CAPSULE DAILY  30  11     No current facility-administered medications for this visit.       ALLERGIES:   Allergies   Allergen Reactions   . Advil (Ibuprofen Micronized) Other     Intolerance           PHYSICAL EXAM:  BP 143/58  Pulse 75  Temp(Src) 97.6 F (36.4 C) (Oral)  Resp 16  Ht 5\' 5"  (1.651 m)  Wt 57.153 kg (126 lb)  BMI 20.97 kg/m2  SpO2 98%  General:  Alert and oriented x 3, conversant, pleasant and appropriate.  ENT: Oropharynx is clear, mucus membranes are moist.  Pulmonary:  Clear to auscultation bilaterally.  Cardiovascular: Regular rate and rhythm. No murmurs, rubs, gallops.  GI: Abdomen soft, non-tender, non-distended.  Extremities: No lower extremity edema.  Neuro: No  asterixis    LABS: Labs were reviewed.     01/20/2012 09:09 02/11/2012 10:05   Sodium 142 143   Potassium 4.1 4.2   Chloride 108 (H) 108 (H)   Bicarbonate 20 (L) 26   BUN 12 14   Creatinine 0.76 0.87   GFR >60 >60   Glucose 134 (H) 117 (H)   Calcium 9.0 8.9   Alkaline Phos 105 118   ALT (SGPT) 21 22   AST (SGOT) 23 32   Bilirubin, Dir  0.3 (H)   Bilirubin, Tot 1.2 (H) 0.9   ALBUMIN 3.2 (L) 3.4 (L)   Total Protein 7.4 7.3   AFP 1.0    WBC 2.2 (L) 2.8 (L)   RBC 3.60 (L) 3.61 (L)   Hgb 11.3 11.7   Hct 32.3 (L) 32.9 (L)   MCV 89.7 91.1   MCH 31.4 32.4 (H)   MCHC 35.0 35.6   RDW 15.1 (H) 15.5 (H)   Plt Count 46 (L) 42 (L)   MPV 10.4 10.1   Segs 64 66   Lymphocytes 22 23   Monocytes 9 9   Eosinophils 4 1   Basophils 1    Absolute Neutrophil Count 1.4 (L) 1.8   Abs Lymphs 0.5 (L) 0.6 (L)   Abs Monos 0.2 0.3   Abs Eosinophils 0.1    Imm Gran % 1    Diff Type Automated Automated   PT,Patient 14.8 (H) 16.4 (H)   INR 1.4 1.5       Imaging Studies:    01/27/12 MRI Abdomen.  IMPRESSION:    1. Nodule assessment:  Evaluation for LIRADS observations is limited due to lack of an appropriate   arterial phase secondary to poor intravenous access. A repeat scan using   Gadovist within 6 months is recommended.    2. Other significant findings:  Cirrhosis with splenomegaly.  Severe iron overload within the liver.  Persistent nonocclusive thrombus at the portal vein confluence and extending   into the cephalad aspect of the superior mesenteric vein posteriorly.    Redemonstration of several cysts within the pancreas without clear main   pancreatic ductal dilatation. Attention to this finding on follow-up   examinations.      Endoscopy Reports:  EGD 12/17/11  Endoscopic Diagnosis: 1. Small distal esophageal varices.   2. Moderate portal hypertensive gastropathy.   3. Small hiatal  hernia.   Recommendations: 1. Continue beta blocker   (Inderal/propranolol).   2. Repeat EGD in 1-2 years for variceal surveillance.   3. Further management per  outpatient hepatologist.       IMPRESSIONS: This is a 71 year old with HCV related cirrhosis, ( Achieved SVR), fairly well compensated. Recent pre-syncopal fall, resulting in nasal fracture.     1.  Varices screening;  - EGD 5/2013howed small distal esophageal varices and moderated portal gastropathy.   - Repeat in May 2014-2015.  -  Continue propranolol 10 mg bid.     2.  HCC screening:  - MRI in June lacked appropriate arterial phase therefore limited for Lirads observations. Recommendation to repeat MRI within 6 months with Gadovist.  - will order repeat MRI now and attempt to coordinate IV access with infusion center prior to MRI.  - Re-assess the IPMN with repeat MRI.     3. Non-Occulsive PVT : extending into cephalad aspect of the superior mesenteric vein posteriorly  - monitor with US/MRI every 6 months.  - discussed potential symptoms c/w bowel ischemia given PVT with enxtension to SMV. ED precautions reviewed.     4.  Fluid retention.   -  Well controlled with lasix 40 mg daily and aldactone 50 mg daily.     5. Diabetes;  - On oral hypoglycemic and insulin.   6. Hernia:  - reviewed symptoms c/w strangulated hernia and advised her to go to the ER if she experiences abdominal pain, nausea, vomiting.    7. Return to hepatology clinic in 6 months with [facility] NP

## 2012-02-18 NOTE — Interdisciplinary (Signed)
Reviewed AVS with patient follow up time/date, instructions.  Patient with good understanding. SS

## 2012-07-14 ENCOUNTER — Other Ambulatory Visit: Payer: Medicare Other | Attending: Family

## 2012-07-14 DIAGNOSIS — K7460 Unspecified cirrhosis of liver: Secondary | ICD-10-CM | POA: Insufficient documentation

## 2012-07-16 ENCOUNTER — Ambulatory Visit
Admission: RE | Admit: 2012-07-16 | Discharge: 2012-07-19 | Disposition: A | Discharge status: Home / Self Care | Payer: Medicare Other | Source: Ambulatory Visit | Attending: Diagnostic Radiology | Admitting: Diagnostic Radiology

## 2012-07-16 DIAGNOSIS — K862 Cyst of pancreas: Secondary | ICD-10-CM | POA: Insufficient documentation

## 2012-07-16 DIAGNOSIS — K863 Pseudocyst of pancreas: Secondary | ICD-10-CM | POA: Insufficient documentation

## 2012-07-16 DIAGNOSIS — K7460 Unspecified cirrhosis of liver: Secondary | ICD-10-CM | POA: Insufficient documentation

## 2012-07-16 DIAGNOSIS — K766 Portal hypertension: Secondary | ICD-10-CM | POA: Insufficient documentation

## 2012-08-16 ENCOUNTER — Encounter (INDEPENDENT_AMBULATORY_CARE_PROVIDER_SITE_OTHER): Payer: Medicare Other

## 2012-08-16 ENCOUNTER — Ambulatory Visit (INDEPENDENT_AMBULATORY_CARE_PROVIDER_SITE_OTHER): Payer: Medicare Other | Admitting: Ophthalmology

## 2012-08-31 ENCOUNTER — Encounter (HOSPITAL_BASED_OUTPATIENT_CLINIC_OR_DEPARTMENT_OTHER): Payer: Self-pay | Admitting: Adult Health

## 2012-08-31 ENCOUNTER — Other Ambulatory Visit: Payer: Medicare Other | Attending: Adult Health

## 2012-08-31 DIAGNOSIS — K7460 Unspecified cirrhosis of liver: Secondary | ICD-10-CM | POA: Insufficient documentation

## 2012-09-02 ENCOUNTER — Ambulatory Visit: Payer: Medicare Other | Attending: Family | Admitting: Family

## 2012-09-02 VITALS — BP 144/76 | HR 61 | Temp 98.1°F | Resp 16 | Ht 65.0 in | Wt 132.0 lb

## 2012-09-02 DIAGNOSIS — K7460 Unspecified cirrhosis of liver: Secondary | ICD-10-CM | POA: Insufficient documentation

## 2012-09-02 DIAGNOSIS — K863 Pseudocyst of pancreas: Secondary | ICD-10-CM | POA: Insufficient documentation

## 2012-09-02 DIAGNOSIS — K862 Cyst of pancreas: Secondary | ICD-10-CM | POA: Insufficient documentation

## 2012-09-02 DIAGNOSIS — I8500 Esophageal varices without bleeding: Secondary | ICD-10-CM | POA: Insufficient documentation

## 2012-09-02 MED ORDER — MULTI-VITAMIN OR TABS: 1.00 | ORAL_TABLET | Freq: Every day | ORAL | Status: AC

## 2012-09-02 NOTE — Patient Instructions (Addendum)
1. Start wearing abdominal binder      If you have any questions or concerns please do not hesitate to call your care team at [phone].     Thank you,    Leone Payor, MD  Assistant Clinical Professor of Medicine  Director of Hepatology  [facility]    Erin Hearing, NP  Hepatology Nurse Practitioner  [facility]    Clarita Crane,   Hepatology Nurse: refill medications or medical questions  [facility]    [facility]  Administrative Assistant: appointments, procedures or insurance issues  [facility]

## 2012-09-02 NOTE — Progress Notes (Signed)
September 02, 2012    Location: [facility]     Hepatologist: Leone Payor, MD    Visit Provider: Erin Hearing, NP     Chief Complaint   Patient presents with   . Hepatitis C   . Cirrhosis   . Ascites        Reason for visit: management of HCV cirrhosis        HPI:  Paula Manning is a 72 year old female with HCV cirrhosis. She was treated for her HCV and achieved an SVR. She was last seen in this clinic 02/18/12 with NP Thomasena Edis. She is new to my clinic.     Since her last visit she has not required any emergency care or hospitalizations. Her only complaint today is her abdominal hernia. She feelsi it is getting bigger and is wondering why it can not be repaired. She has intermittently worn an abdominal binder.     Patient Active Problem List    Diagnosis Date Noted   . Nasal bone fx-closed 10/17/2011   . Syncope 10/17/2011   . Status post thoracentesis 12/28/2010   . Pleural effusion associated with hepatic disorder 12/27/2010   . Fracture of proximal humerus 08/14/2009   . Hypertensive disorder 12/12/2008   . Hyperlipidemia 12/12/2008   . Microalbuminuria 12/12/2008   . Osteoporosis 07/26/2007   . Ascites 06/29/2007     History of paracentesis x 2     . 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 Cirrhosis of liver 06/24/2007     Removed from liver transplant waiting list due to low MELD and low likelihood of requiring a transplant in the near future.  Blood type of O positive.  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 Emphysema 06/24/2007   . Anemia 06/10/2007   . EV (esophageal varices) 06/10/2007   . Seizure Disorder 06/10/2007   . DM circ dis type II 06/10/2007   . Viral hepatitis C 06/10/2007     H/o GT2 but has achieved SVR.       Marland Kitchen Hypertensive disorder 06/10/2007         Past Medical History   Diagnosis Date   . Migraine headache    . Glaucoma    . Ascites 06/29/2007     History  of paracentesis x 2   . 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 Cirrhosis 06/24/2007     Active on the liver transplant waiting list with a blood type of O positive. 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 Osteopenia 06/24/2007   . Emphysema 06/24/2007   . EV (esophageal varices) 06/10/2007   . Anemia 06/10/2007   . HCV (hepatitis C virus) 06/10/2007     Achieved SVR.   Marland Kitchen Type II or unspecified type diabetes mellitus with peripheral circulatory disorders, not stated as uncontrolled 06/10/2007   . HTN 06/10/2007   . HTN 06/10/2007       Allergies   Allergen Reactions   . Advil (Ibuprofen Micronized) Other     Intolerance           Current Outpatient Prescriptions on File Prior to Visit   Medication Sig Dispense Refill   . bacitracin 500 UNIT/GM ointment Apply small amount to affected area on face twice daily  1 Tube  1   . CALCIUM CARBONATE 600 MG OR TABS 1 tablet 2 times daily  one month  11   . furosemide (LASIX) 20 MG tablet Take 1 tablet by mouth daily.  60 tablet  0   . glipiZIDE (GLUCOTROL) 10 MG tablet Take 1 tablet by mouth 2 times daily.  60 tablet  3   . HYDROcodone-acetaminophen (VICODIN) 5-500 MG per tablet Take 1 tablet by mouth every 6 hours as needed for Moderate Pain (Pain Score 4-6) or Severe Pain (Pain Score 7-10).  30 tablet  0   . insulin glargine (LANTUS) 100 UNIT/ML injection Inject 10 Units under the skin nightly.  1 Vial  2   . multivitamin (MULTI-VITAMIN) tablet Take 1 tablet by mouth daily.       . ondansetron (ZOFRAN) 4 MG tablet Take 1 tablet by mouth every 8 hours as needed for Nausea/Vomiting.  15 tablet  0   . propranolol (INDERAL) 10 MG tablet Take 1 tablet by mouth 3 times daily.  60 tablet  0   . rifaximin (XIFAXAN) 550 MG tablet Take 550 mg by mouth every 12 hours.       Marland Kitchen spironolactone (ALDACTONE) 25 MG tablet Take 2 tablets by mouth daily.  60 tablet  0   . traMADol (ULTRAM) 50 MG tablet Take 1 tablet by mouth every 6 hours as needed for Moderate Pain (Pain Score  4-6).  30 tablet  0   . VITAMIN D 400 UNIT OR CAPS 1 CAPSULE DAILY  30  11     No current facility-administered medications on file prior to visit.       History     Social History   . Marital Status: Married     Spouse Name: N/A     Number of Children: N/A   . Years of Education: N/A     Occupational History   . Not on file.     Social History Main Topics   . Smoking status: Former Smoker -- 10 years   . Smokeless tobacco: Never Used   . Alcohol Use: No   . Drug Use: Not on file   . Sexually Active: Not on file     Other Topics Concern   . Not on file     Social History Narrative   . No narrative on file        No family history on file.     Past Surgical History   Procedure Laterality Date   . Pb laps surg cholecstc w/expl common duct  1970s     in [place];  open CCX   . Pb cesarean delivery only     . Pb cstoplasty/cstourtp plstc any       Bladder suspension           Review of Systems -  Constitutional: no fever, chills or weight loss  Eyes: no vision changes  HENT: no headaches, nose bleeds or oral lesions  Cardiovascular: no chest pain or peripheral edema  Respiratory: no SOB, dyspnea or hemoptysis  Gastrointestinal: no  nausea, vomiting, or hematemesis. No abdominal distention, + abdominal hernia  Genitourinary: no dysuria  Musculoskeletal: no pain or swelling of muscles or joints  Integumentary: no rashes or jaundice  Neurological: no tremors or difficulties with memory  Psychiatric: Negative  Endocrine: no cold/heat intolerance, no excessive sweating or polyuria  Hematologic/Lymphatic: no easy bruising or bleeding  Allergic/Immunologic: negative, see allergy list        Physical  Exam: (9 systems examined)     Vital Signs: BP 144/76  Pulse 61  Temp(Src) 98.1 F (36.7 C) (Oral)  Resp 16  Ht 5\' 5"  (1.651 m)  Wt 59.875 kg (132 lb)  BMI 21.97 kg/m2  SpO2 98%     BMI: Body mass index is 21.97 kg/(m^2).     Systems:  GEN: appears well, in no apparent distress. Alert and oriented times three, pleasant and  cooperative.  HEENT: no sclera icterus, O/P: mmm no lesions  Lymph nodes:  negative lymph nodes  Pulmonary: CTAB, no wheezes, good air movement  Cardiovascular: regular rate & rhythm,  no murmurs, gallops or rubs  Gastrointestinal: BS x4, soft NTND, liver and spleen not palpable, abdominal scar RUQ s/p cholecystectomy, + reducible RUQ herniia  Rectal and genitalia: Not examined  Neurologic including mental status: Alert & oriented x3. No encephalopathy or asterixis  Dermatologic:no jaundice, rash, or spiders  Musculoskeletal: Normal tone        Laboratory:   Results for orders placed in visit on 08/31/12   COMPREHENSIVE METABOLIC PANEL, BLOOD       Result Value Range    Glucose 101  70 - 115 mg/dL    BUN 11  6 - 20 mg/dL    Creatinine 1.61  0.96 - 0.95 mg/dL    GFR >04      Sodium 138  136 - 145 mmol/L    Potassium 4.2  3.5 - 5.1 mmol/L    Chloride 105  98 - 107 mmol/L    Bicarbonate 24  22 - 29 mmol/L    Calcium 8.6 (*) 8.8 - 10.2 mg/dL    Total Protein 7.4  6.0 - 8.0 g/dL    Albumin 3.3 (*) 3.5 - 5.2 g/dL    Bilirubin, Tot 1.3 (*) <1.2 mg/dL    AST (SGOT) 33 (*) 0 - 32 U/L    ALT (SGPT) 30  0 - 33 U/L    Alkaline Phos 123  35 - 140 U/L   CBC WITH ADIFF, BLOOD       Result Value Range    WBC 3.1 (*) 4.0 - 10.0 1000/mm3    RBC 3.66 (*) 3.90 - 5.20 mill/mm3    Hgb 11.9  11.2 - 15.7 gm/dL    Hct 54.0 (*) 98.1 - 45.0 %    MCV 92.1  79.0 - 95.0 um3    MCH 32.5 (*) 26.0 - 32.0 pgm    MCHC 35.3  32.0 - 36.0 %    RDW 14.7 (*) 12.0 - 14.0 %    MPV 9.5  9.4 - 12.4 fL    Plt Count 37 (*) 140 - 370 1000/mm3    Segs 66  34 - 71 %    Lymphocytes 21  19 - 53 %    Monocytes 11  5 - 12 %    Eosinophils 2  1 - 7 %    Absolute Neutrophil Count 2.0  1.6 - 7.0 K/uL    Abs Lymphs 0.7 (*) 0.8 - 3.1 1000/mm3    Abs Monos 0.3  0.2 - 0.8 1000/mm3    Abs Eosinophils 0.1  0.0 - 0.5 1000/mm3    Diff Type Automated      Plt Est Decreased      Burr Cell 1+     PROTHROMBIN TIME, BLOOD       Result Value Range    PT,Patient 16.2 (*) 9.7 - 12.5 sec     INR 1.5  BILIRUBIN, DIR BLOOD       Result Value Range    Bilirubin, Dir 0.4 (*) <0.2 mg/dL   ALPHA FETOPROTEIN TOTAL & L3 PERCENT       Result Value Range    AFP Total 2  0 - 15 ng/mL    AFP L3% < 0.5  0.0 - 9.9 %        Endoscopy:    Endoscopic Diagnosis: 12/17/11  1. Small distal esophageal varices.   2. Moderate portal hypertensive gastropathy.   3. Small hiatal hernia.   Recommendations: 1. Continue beta blocker   (Inderal/propranolol).   2. Repeat EGD in 1-2 years for variceal surveillance.    Imaging:  MRI abdomen 07/16/12  1. Observation assessment:  LIRADS NEGATIVE - no reportable LIRADS observations    2. Radiologic recommendation: Routine surveillance    3. Cirrhosis with stigmata of portal hypertension.    4. Persistent partially occlusive thrombus at the portal vein confluence with  extension into the cephalad aspect of the superior mesenteric vein.     5. Severe iron deposition.    6. Multiple pancreatic head/neck cyst not significantly changed compared to   the prior examination.    I have reviewed all lab documents in media and imaging and procedures in EPIC    Assessment and Plan: 72 yo hispanic female HCV GT 2 cirrhosis (achieved SVR)  fairly well compensated. 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    1. Cirrhosis: MELD 12, CPT A.     2. CHC GT 2: achieved SVR.    3. HCC surveillance: UTD. Repeat imaging 01/2013 with u/s.  - Will alternate with MRI to assess the IPMN    4. EV surveillance: repeat surveillance 01/2013.    5. Ventral hernia:   - abdominal binder  - perioperative mortality ~10% with Georgiann Cocker A cirrhosis  - evaluated by surgeon and declined for repair  - advised her to call or go to the ER if she develops pain with nausea, vomiting or fever in case the hernia is strangulated    6. Fluid retention.   - Well controlled with lasix 40 mg daily and aldactone 50 mg daily.     7.  Diabetes. On oral hypoglycemic and insulin.     8. Return to  clinic: 5 months    Erin Hearing, NP

## 2012-09-02 NOTE — Interdisciplinary (Signed)
1508 Written and verbal instructions  given to patient. Verbalized  Understanding. D/C ambulatory, in no apparent distress.   Patient Education  Identified learning needs: Medication,uses,dose,side effects.  Learner: patient   Barriers to learning:   Readiness to learn: acceptance  Method: Explanation and handout.  Treatment education given:   Fall prevention education given:   Pain education given:   Response: Verbalized understanding    Binder given to patient.

## 2012-11-29 DIAGNOSIS — Z87891 Personal history of nicotine dependence: Secondary | ICD-10-CM

## 2012-11-29 DIAGNOSIS — B182 Chronic viral hepatitis C: Secondary | ICD-10-CM | POA: Diagnosis present

## 2012-11-29 DIAGNOSIS — Z7682 Awaiting organ transplant status: Secondary | ICD-10-CM

## 2012-11-29 DIAGNOSIS — I798 Other disorders of arteries, arterioles and capillaries in diseases classified elsewhere: Secondary | ICD-10-CM | POA: Diagnosis present

## 2012-11-29 DIAGNOSIS — R0902 Hypoxemia: Secondary | ICD-10-CM | POA: Diagnosis present

## 2012-11-29 DIAGNOSIS — J189 Pneumonia, unspecified organism: Principal | ICD-10-CM | POA: Diagnosis present

## 2012-11-29 DIAGNOSIS — I10 Essential (primary) hypertension: Secondary | ICD-10-CM | POA: Diagnosis present

## 2012-11-29 DIAGNOSIS — J438 Other emphysema: Secondary | ICD-10-CM | POA: Diagnosis present

## 2012-11-29 DIAGNOSIS — Z86718 Personal history of other venous thrombosis and embolism: Secondary | ICD-10-CM

## 2012-11-29 DIAGNOSIS — R188 Other ascites: Secondary | ICD-10-CM | POA: Diagnosis present

## 2012-11-29 DIAGNOSIS — K7460 Unspecified cirrhosis of liver: Secondary | ICD-10-CM | POA: Diagnosis present

## 2012-11-29 DIAGNOSIS — H409 Unspecified glaucoma: Secondary | ICD-10-CM | POA: Diagnosis present

## 2012-11-29 NOTE — ED Notes (Signed)
EKG  HR 74, Sinus, normal intervals, normal axis, normal qrs, no st e/d, no twi's    Tyrone Apple, MD  Resident  11/29/12 [phone]

## 2012-11-30 ENCOUNTER — Encounter (HOSPITAL_COMMUNITY): Payer: Self-pay

## 2012-11-30 ENCOUNTER — Inpatient Hospital Stay (HOSPITAL_COMMUNITY): Payer: Medicare Other | Admitting: Hospitalist

## 2012-11-30 ENCOUNTER — Inpatient Hospital Stay
Admission: EM | Admit: 2012-11-30 | Discharge: 2012-12-01 | DRG: 194 | Disposition: A | Discharge status: Home Health | Payer: Medicare Other | Attending: Hospitalist | Admitting: Hospitalist

## 2012-11-30 DIAGNOSIS — J189 Pneumonia, unspecified organism: Principal | ICD-10-CM | POA: Diagnosis present

## 2012-11-30 DIAGNOSIS — E1151 Type 2 diabetes mellitus with diabetic peripheral angiopathy without gangrene: Secondary | ICD-10-CM | POA: Diagnosis present

## 2012-11-30 DIAGNOSIS — R0902 Hypoxemia: Secondary | ICD-10-CM | POA: Diagnosis present

## 2012-11-30 DIAGNOSIS — I10 Essential (primary) hypertension: Secondary | ICD-10-CM | POA: Diagnosis present

## 2012-11-30 DIAGNOSIS — K7460 Unspecified cirrhosis of liver: Secondary | ICD-10-CM | POA: Diagnosis present

## 2012-11-30 DIAGNOSIS — I798 Other disorders of arteries, arterioles and capillaries in diseases classified elsewhere: Secondary | ICD-10-CM

## 2012-11-30 LAB — CBC WITH DIFF, BLOOD
ANC-Automated: 6.4 10*3/uL (ref 1.6–7.0)
ANC-Automated: 5.5 10*3/uL (ref 1.6–7.0)
Abs Lymphs: 0.5 10*3/uL — ABNORMAL LOW (ref 0.8–3.1)
Abs Lymphs: 0.5 10*3/uL — ABNORMAL LOW (ref 0.8–3.1)
Abs Monos: 0.3 10*3/uL (ref 0.2–0.8)
Abs Monos: 0.4 10*3/uL (ref 0.2–0.8)
Hct: 32.9 % — ABNORMAL LOW (ref 34.0–45.0)
Hct: 31.5 % — ABNORMAL LOW (ref 34.0–45.0)
Hgb: 11.8 gm/dL (ref 11.2–15.7)
Hgb: 11.1 gm/dL — ABNORMAL LOW (ref 11.2–15.7)
Lymphocytes: 7 % — ABNORMAL LOW (ref 19–53)
Lymphocytes: 8 % — ABNORMAL LOW (ref 19–53)
MCH: 32.5 pg — ABNORMAL HIGH (ref 26.0–32.0)
MCH: 32.3 pg — ABNORMAL HIGH (ref 26.0–32.0)
MCHC: 35.9 % (ref 32.0–36.0)
MCHC: 35.2 % (ref 32.0–36.0)
MCV: 90.6 um3 (ref 79.0–95.0)
MCV: 91.6 um3 (ref 79.0–95.0)
MPV: 10.3 fL (ref 9.4–12.4)
MPV: 10.7 fL (ref 9.4–12.4)
Monocytes: 4 % — ABNORMAL LOW (ref 5–12)
Monocytes: 6 % (ref 5–12)
Plt Count: 48 10*3/uL — ABNORMAL LOW (ref 140–370)
Plt Count: 44 10*3/uL — ABNORMAL LOW (ref 140–370)
Plt Est: DECREASED
RBC: 3.63 10*6/uL — ABNORMAL LOW (ref 3.90–5.20)
RBC: 3.44 10*6/uL — ABNORMAL LOW (ref 3.90–5.20)
RDW: 14.6 % — ABNORMAL HIGH (ref 12.0–14.0)
RDW: 14.9 % — ABNORMAL HIGH (ref 12.0–14.0)
Segs: 89 % — ABNORMAL HIGH (ref 34–71)
Segs: 86 % — ABNORMAL HIGH (ref 34–71)
WBC: 7.2 10*3/uL (ref 4.0–10.0)
WBC: 6.4 10*3/uL (ref 4.0–10.0)

## 2012-11-30 LAB — APTT, BLOOD: PTT: 32.4 s (ref 25.0–34.0)

## 2012-11-30 LAB — BASIC METABOLIC PANEL, BLOOD
BUN: 12 mg/dL (ref 6–20)
Bicarbonate: 22 mmol/L (ref 22–29)
Calcium: 8.6 mg/dL — ABNORMAL LOW (ref 8.8–10.2)
Chloride: 103 mmol/L (ref 98–107)
Creatinine: 0.7 mg/dL (ref 0.51–0.95)
GFR: >60 mL/min
Glucose: 183 mg/dL — ABNORMAL HIGH (ref 70–115)
Potassium: 4.1 mmol/L (ref 3.5–5.1)
Sodium: 133 mmol/L — ABNORMAL LOW (ref 136–145)

## 2012-11-30 LAB — MAGNESIUM, BLOOD: Magnesium: 1.7 mg/dL (ref 1.7–2.6)

## 2012-11-30 LAB — COMPREHENSIVE METABOLIC PANEL, BLOOD
ALT (SGPT): 27 U/L (ref 0–33)
AST (SGOT): 34 U/L — ABNORMAL HIGH (ref 0–32)
Albumin: 3.1 g/dL — ABNORMAL LOW (ref 3.5–5.2)
Alkaline Phos: 119 U/L (ref 35–140)
BUN: 13 mg/dL (ref 6–20)
Bicarbonate: 24 mmol/L (ref 22–29)
Bilirubin, Tot: 1.4 mg/dL — ABNORMAL HIGH (ref <1.2)
Calcium: 8.6 mg/dL — ABNORMAL LOW (ref 8.8–10.2)
Chloride: 104 mmol/L (ref 98–107)
Creatinine: 0.77 mg/dL (ref 0.51–0.95)
GFR: >60 mL/min
Glucose: 173 mg/dL — ABNORMAL HIGH (ref 70–115)
Potassium: 4.3 mmol/L (ref 3.5–5.1)
Sodium: 136 mmol/L (ref 136–145)
Total Protein: 7 g/dL (ref 6.0–8.0)

## 2012-11-30 LAB — TROPONIN T, BLOOD: Troponin T: <0.01 ng/mL (ref <0.01)

## 2012-11-30 LAB — PROTHROMBIN TIME, BLOOD
INR: 1.7
INR: 1.8
PT,Patient: 18.5 s — ABNORMAL HIGH (ref 9.7–12.5)
PT,Patient: 18.5 s — ABNORMAL HIGH (ref 9.7–12.5)

## 2012-11-30 LAB — PHOSPHORUS, BLOOD: Phosphorous: 2 mg/dL — ABNORMAL LOW (ref 2.7–4.5)

## 2012-11-30 LAB — GLYCOSYLATED HGB(A1C), BLOOD: Glyco Hgb (A1C): 6 % — ABNORMAL HIGH (ref 4.8–5.9)

## 2012-11-30 MED ORDER — SODIUM CHLORIDE 0.9 % IJ SOLN (CUSTOM)
3.0000 mL | INTRAMUSCULAR | Status: DC | PRN
Start: 2012-11-30 — End: 2012-12-01

## 2012-11-30 MED ORDER — INSULIN LISPRO (HUMAN) 100 UNIT/ML SC SOLN (CUSTOM)
1.0000 [IU] | Freq: Four times a day (QID) | INTRAMUSCULAR | Status: DC
Start: 2012-11-30 — End: 2012-12-01
  Administered 2012-11-30 (×2): 1 [IU] via SUBCUTANEOUS
  Administered 2012-12-01: 3 [IU] via SUBCUTANEOUS
  Filled 2012-11-30 (×3): qty 1

## 2012-11-30 MED ORDER — INSULIN GLARGINE 100 UNIT/ML SC SOLN
10.0000 [IU] | Freq: Every evening | SUBCUTANEOUS | Status: DC
Start: 2012-11-30 — End: 2012-12-01
  Administered 2012-11-30: 10 [IU] via SUBCUTANEOUS
  Filled 2012-11-30: qty 10

## 2012-11-30 MED ORDER — DEXTROSE (DIABETIC USE) 40 % OR GEL
1.0000 | ORAL | Status: DC | PRN
Start: 2012-11-30 — End: 2012-12-01

## 2012-11-30 MED ORDER — FUROSEMIDE 20 MG OR TABS
20.0000 mg | ORAL_TABLET | Freq: Every day | ORAL | Status: DC
Start: 2012-11-30 — End: 2012-12-01
  Administered 2012-11-30 – 2012-12-01 (×2): 20 mg via ORAL
  Filled 2012-11-30 (×2): qty 1

## 2012-11-30 MED ORDER — ONDANSETRON HCL 4 MG/2ML IV SOLN
4.0000 mg | Freq: Four times a day (QID) | INTRAMUSCULAR | Status: DC | PRN
Start: 2012-11-30 — End: 2012-12-01

## 2012-11-30 MED ORDER — BENZONATATE 100 MG OR CAPS
100.0000 mg | ORAL_CAPSULE | Freq: Three times a day (TID) | ORAL | Status: DC | PRN
Start: 2012-11-30 — End: 2012-12-01
  Administered 2012-11-30 – 2012-12-01 (×2): 100 mg via ORAL
  Filled 2012-11-30 (×2): qty 1

## 2012-11-30 MED ORDER — ALBUTEROL SULFATE (5 MG/ML) 0.5% IN NEBU
15.00 mg | INHALATION_SOLUTION | Freq: Once | RESPIRATORY_TRACT | Status: AC
Start: 2012-11-30 — End: 2012-11-30
  Administered 2012-11-30: 15 mg via RESPIRATORY_TRACT
  Filled 2012-11-30: qty 3

## 2012-11-30 MED ORDER — DEXTROSE 50 % IV SOLN
12.5000 g | INTRAVENOUS | Status: DC | PRN
Start: 2012-11-30 — End: 2012-12-01

## 2012-11-30 MED ORDER — AZITHROMYCIN 250 MG OR TABS
250.0000 mg | ORAL_TABLET | Freq: Every day | ORAL | Status: DC
Start: 2012-12-01 — End: 2012-12-01
  Administered 2012-12-01: 250 mg via ORAL
  Filled 2012-11-30: qty 1

## 2012-11-30 MED ORDER — IPRATROPIUM BROMIDE 0.02 % IN SOLN
1.50 mg | Freq: Once | RESPIRATORY_TRACT | Status: AC
Start: 2012-11-30 — End: 2012-11-30
  Administered 2012-11-30: 1.5 mg via RESPIRATORY_TRACT
  Filled 2012-11-30: qty 7.5

## 2012-11-30 MED ORDER — MULTI-VITAMINS PO TABS
1.0000 | ORAL_TABLET | Freq: Every day | ORAL | Status: DC
Start: 2012-11-30 — End: 2012-12-01
  Administered 2012-11-30 – 2012-12-01 (×2): 1 via ORAL
  Filled 2012-11-30 (×2): qty 1

## 2012-11-30 MED ORDER — SODIUM CHLORIDE 0.9 % IV SOLN
1000.0000 mg | INTRAVENOUS | Status: DC
Start: 2012-11-30 — End: 2012-12-01
  Administered 2012-11-30 – 2012-12-01 (×2): 1000 mg via INTRAVENOUS
  Filled 2012-11-30 (×2): qty 1000

## 2012-11-30 MED ORDER — RIFAXIMIN 550 MG PO TABS
550.0000 mg | ORAL_TABLET | Freq: Two times a day (BID) | ORAL | Status: DC
Start: 2012-11-30 — End: 2012-12-01
  Administered 2012-11-30 – 2012-12-01 (×3): 550 mg via ORAL
  Filled 2012-11-30 (×3): qty 1

## 2012-11-30 MED ORDER — SODIUM CHLORIDE 0.9 % IJ SOLN (CUSTOM)
3.0000 mL | Freq: Three times a day (TID) | INTRAMUSCULAR | Status: DC
Start: 2012-11-30 — End: 2012-12-01
  Administered 2012-11-30 – 2012-12-01 (×4): 3 mL via INTRAVENOUS

## 2012-11-30 MED ORDER — HEPARIN SODIUM (PORCINE) PF 5000 UNIT/0.5ML IJ SOLN
5000.0000 [IU] | Freq: Two times a day (BID) | INTRAMUSCULAR | Status: DC
Start: 2012-11-30 — End: 2012-11-30

## 2012-11-30 MED ORDER — GLUCAGON HCL (RDNA) 1 MG IJ SOLR
1.0000 mg | Freq: Once | INTRAMUSCULAR | Status: DC | PRN
Start: 2012-11-30 — End: 2012-12-01

## 2012-11-30 MED ORDER — TRAMADOL HCL 50 MG OR TABS
50.0000 mg | ORAL_TABLET | Freq: Four times a day (QID) | ORAL | Status: DC | PRN
Start: 2012-11-30 — End: 2012-12-01

## 2012-11-30 MED ORDER — SODIUM CHLORIDE 0.9 % IV SOLN
INTRAVENOUS | Status: DC | PRN
Start: 2012-11-30 — End: 2012-12-01

## 2012-11-30 MED ORDER — GLUCOSE 4 GM PO CHEW (CUSTOM)
4.0000 | CHEWABLE_TABLET | ORAL | Status: DC | PRN
Start: 2012-11-30 — End: 2012-12-01

## 2012-11-30 MED ORDER — ACETAMINOPHEN 325 MG PO TABS
650.0000 mg | ORAL_TABLET | ORAL | Status: DC | PRN
Start: 2012-11-30 — End: 2012-12-01

## 2012-11-30 MED ORDER — PROPRANOLOL HCL 10 MG OR TABS
10.0000 mg | ORAL_TABLET | Freq: Three times a day (TID) | ORAL | Status: DC
Start: 2012-11-30 — End: 2012-12-01
  Administered 2012-11-30 – 2012-12-01 (×4): 10 mg via ORAL
  Filled 2012-11-30 (×5): qty 1

## 2012-11-30 MED ORDER — AZITHROMYCIN 250 MG OR TABS
500.00 mg | ORAL_TABLET | Freq: Once | ORAL | Status: AC
Start: 2012-11-30 — End: 2012-11-30
  Administered 2012-11-30: 500 mg via ORAL
  Filled 2012-11-30: qty 2

## 2012-11-30 MED ORDER — SPIRONOLACTONE 25 MG OR TABS
50.0000 mg | ORAL_TABLET | Freq: Every day | ORAL | Status: DC
Start: 2012-11-30 — End: 2012-12-01
  Administered 2012-11-30 – 2012-12-01 (×2): 50 mg via ORAL
  Filled 2012-11-30 (×2): qty 2

## 2012-11-30 NOTE — Plan of Care (Signed)
Problem: Breathing Pattern - Ineffective  Goal: Respiratory rate, rhythm and depth return to baseline  Patient breathing normal, regular, and unlabored. Patient placed on 2 L/min NC, breath sounds displays generalized crackles. MD informed of patient status. No apparent distress noted.     Problem: Discharge Planning  Goal: Participation in care planning  Outcome: Met  Patient remains calm, cooperative, and appropriate to POC. Patient accompanied by family members and seems to have great family support. No plans to d/c patient at this time.     Problem: Falls - Risk of  Goal: Absence of falls  Outcome: Met  Patient remains absent of falls. RN instructed patient and family to seek assistance via use of call light prior to getting OOB to prevent risk of falls. Fall precautions in place including call light within reach, bed alarm on, side rails 2/4, wheels locked, personal belongings and call light within reach. RN to perform hourly rounds to ensure safety.

## 2012-11-30 NOTE — Plan of Care (Signed)
Problem: Breathing Pattern - Ineffective  Goal: Respiratory rate, rhythm and depth return to baseline  Outcome: Met  Patient breathing normal, regular, and unlabored on RA with O2 sats > 95%. Patient denies SOB, breath sounds rales localized in all lung fields.     Problem: Discharge Planning  Goal: Participation in care planning  Outcome: Met  Patient remains calm, cooperative, and appropriate to POC. No plans to d/c at this time. Patient family at bedside.     Problem: Falls - Risk of  Goal: Absence of falls  Outcome: Met  Patient remains absent of falls. Fall precautions in place including bed at lowest position, side rails 2/4, wheels locked, personal belongings and call light within reach. Patient and family members encouraged not to get OOB without assistance. Patient verbalized understanding. RN performing hourly rounds to ensure safety.     Problem: Infection  Goal: Absence of infection signs and symptoms  Outcome: Not Met  Patient remains afebrile, receiving IV Abx, vital signs stable, no apparent distress noted.

## 2012-11-30 NOTE — ED Provider Notes (Signed)
Emergency Department Note    Nursing Triage Note :   Chief Complaint   Patient presents with    Cough     Pt endorsing cough and trouble breathing beginning yesterday afternoon and gradually getting worse pain. Pt also endorsing tightness in chest. Dry cough present.       [facility] electronic medical record reviewed for pertinent medical history.     HPI :   72 year old  female with a PMH significant for HTN, HCV, cirrhosis, DM, HLD, COPD presents with cough for several days. The pt's cough has been productive - unclear what color the sputum is. Pt also feeling SOB since onset of cough "i can barely breath". She states she never has trouble breathing at home and has never been on oxygen. She states that she may have gotten sick from her husband who had similar symptoms recently. He received some sort of outpt treatment for his symptoms. She endorses subjective fevers and chills, no n/v/d. The pt is a poor historian and has limited contribution to this HPI. Daughter at bedside provides some details to HPI.     Past Medical History :   Past Medical History   Diagnosis Date    Migraine headache     Glaucoma     Ascites 06/29/2007     History of paracentesis x 2    Pancreatic cyst 06/29/2007    Cirrhosis 06/24/2007     Active on the liver transplant waiting list with a blood type of O positive. 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    Type II or unspecified type diabetes mellitus with peripheral circulatory disorders, not stated as uncontrolled 06/10/2007    HTN 06/10/2007    HTN 06/10/2007       Past Surgical history :   Past Surgical History   Procedure Laterality Date    Pb laps surg cholecstc w/expl common duct  1970s     in [place];  open CCX    Pb cesarean delivery only      Pb cstoplasty/cstourtp plstc any        Bladder suspension       Medications:  Patient's Medications   New Prescriptions    No medications on file   Previous Medications    BACITRACIN 500 UNIT/GM OINTMENT    Apply small amount to affected area on face twice daily    CALCIUM CARBONATE 600 MG OR TABS    1 tablet 2 times daily    FUROSEMIDE (LASIX) 20 MG TABLET    Take 1 tablet by mouth daily.    GLIPIZIDE (GLUCOTROL) 10 MG TABLET    Take 1 tablet by mouth 2 times daily.    HYDROCODONE-ACETAMINOPHEN (VICODIN) 5-500 MG PER TABLET    Take 1 tablet by mouth every 6 hours as needed for Moderate Pain (Pain Score 4-6) or Severe Pain (Pain Score 7-10).    INSULIN GLARGINE (LANTUS) 100 UNIT/ML INJECTION    Inject 10 Units under the skin nightly.    MULTIVITAMIN (MULTI-VITAMIN) TABLET    Take 1 tablet by mouth daily.    ONDANSETRON (ZOFRAN) 4 MG TABLET    Take 1 tablet by mouth every 8 hours as needed for Nausea/Vomiting.    PROPRANOLOL (INDERAL) 10 MG TABLET  Take 1 tablet by mouth 3 times daily.    RIFAXIMIN (XIFAXAN) 550 MG TABLET    Take 550 mg by mouth every 12 hours.    SPIRONOLACTONE (ALDACTONE) 25 MG TABLET    Take 2 tablets by mouth daily.    TRAMADOL (ULTRAM) 50 MG TABLET    Take 1 tablet by mouth every 6 hours as needed for Moderate Pain (Pain Score 4-6).    VITAMIN D 400 UNIT OR CAPS    1 CAPSULE DAILY   Modified Medications    No medications on file   Discontinued Medications    No medications on file       Allergies :  Advil    Review of Systems:   All other pertinant ROS not mentioned in the HPI have been reviewed and are negative.         Physical Exam:   4    11/29/12  2149   BP: 148/61   Pulse: 75   Temp: 98.6 F (37 C)   Resp: 16   SpO2: 92%     Nursing note and vitals reviewed.  Constitutional: The patient is oriented to person, place, and time and appears. well-developed and well-nourished. No distress.   Cardiovascular: Regular rhythm, normal heart sounds. Exam reveals no gallop and no friction rub. No murmur heard.  Pulmonary/Chest: Pt  appears SOB, slight diffuse wheezes, decreased BS over LLQ.   Abdominal: Soft. No distension and no masses. There is no tenderness.   Neuro: CNs 2-12 intact, mentation appropriate, steady gait.     ED Course & Clinical Decision Making:  Pt with SOB, subjective fevers, productive cough, hypoxia, +sick contacts.   Likley pneumonia.     CXR: LLL atelectasis, possible early infection.   CBC: no leukocytosis, no anemia, + thrombocytopenia  BMP: no renal dysfunction, all electrolytes unremarkable  Liver Panel:  e/o of liver disease, abnormal biliary labs.   Lipase: wnl  Coags: normal  Trop: wnl  UA: non-infectious, no blood    Given dose of azithromycin and breathing tx in ED.   Pt fees slightly better.     Admit IMU for hypoxia in setting of early infection.     I have discussed my thought process and plan for the patient with the attending, Dr. Kristeen Mans.     Melody Comas, MD (PGY2)            Rosie Fate, MD  Resident  11/30/12 [phone]

## 2012-11-30 NOTE — Interdisciplinary (Signed)
11/30/12 1532   Patient Information   Why is Patient in the Hospital? 72 year old female h/o HCV cirrhosis on the transplant list with ascites who presented to the ED with productive cough and SOB. Reports development of a non-productive cough 2 days ago with worsening shortness of breath and chest tightness.   Prior to Level of Function Use Assisted Devices   Assistive Device [phone] w/ seat at bedside)   Income Information   Income Source (retired)   Referral To   Financial Resources Medicare;Medicaid   Discharge Planning   Living Arrangements Spouse / significant other  (husband/Paula Manning [phone])   Support Systems Children;Spouse / significant other  Paula Manning [phone])   Type of Residence Private residence   Home Care Services No   Patient expects to be discharged to: home   Do you have transportation home?  Yes   CM met with patient, husband and daughter/[name] at the bedside. The patient lives at home with her husband who assists with ADLs. The patient has family support also. PCP is in place, Dr. Megan Salon. CM will continue to follow for discharge needs.

## 2012-11-30 NOTE — ED Floor Report (Signed)
Paula Manning is a 72 year old female    Chief Complaint   Patient presents with    Cough     Pt endorsing cough and trouble breathing beginning yesterday afternoon and gradually getting worse pain. Pt also endorsing tightness in chest. Dry cough present.       Admitted for There are no admission diagnoses documented for this encounter.    Code Status:  Please refer to In-pt admitting doctors orders     Past Medical History   Diagnosis Date    Migraine headache     Glaucoma     Ascites 06/29/2007     History of paracentesis x 2    Pancreatic cyst 06/29/2007    Cirrhosis 06/24/2007     Active on the liver transplant waiting list with a blood type of O positive. 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    Type II or unspecified type diabetes mellitus with peripheral circulatory disorders, not stated as uncontrolled(250.70) 06/10/2007    HTN 06/10/2007    HTN 06/10/2007       Past Surgical History   Procedure Laterality Date    Pb laps surg cholecstc w/expl common duct  1970s     in [place];  open CCX    Pb cesarean delivery only      Pb cstoplasty/cstourtp plstc any       Bladder suspension       Allergies: Advil    Level of Care : IMU    Fall Risk: yes    Skin issues:  no    >> If yes, note areas of skin breakdown. See appropriate photos.      Ambulatory:  yes    Sitter needed: no    Suicide Risk :  no    Isolation Required: no     >> If yes , what type of isolation:    Is patient in custody?  no    Is patient in restraints? no    Is patient on Heparin? no If yes, complete below:   Bolus=    Units      Units/hr   @   Mls/hour      Time of next PT/PTT draw:          Brief Summary of ED Visit (to include focused assessment and neuro status):  Pt presents to ed with family c/o cough for 3 days with increasing severity  also associated with chest tightness and difficulty breathing, pt is spanish speaking only but is a/o x 4, pt ambulates with personal walker, pt has persistent weak cough with states yellow sputum, bilateral lower lobe rales auscultated with increased in the left lower long, pt initial room air oxygen saturation in the 80s and is increased to 95 with 2l nasal cannula, pt given respiratory treatment and states that she feels somewhat better with treatment, xray and labs done, pt has 20g right wrist, pt has not had any c/o of pain otherwise, family remains at bedside and are helpful in pt care      See  RN SHIFT ASSESSMENT ADULT  for full assessment, exceptions will be listed.    Cardiac rhythm:  NSR    Oxygen Delivery: Nasal Cannula 3 L/Min    [place]  Vitals:    11/29/12 2149 11/30/12 0048 11/30/12 0123 11/30/12 0310   BP: 148/61 153/67  132/38   Pulse: 75 77  73   Temp: 98.6 F (37 C)      Resp: 16 15     Height: 5\' 5"  (1.651 m)      Weight: 58.968 kg (130 lb)      SpO2: 92% 96% 88% 96%       Lab Results   Component Value Date    WBC 7.2 11/30/2012    RBC 3.63* 11/30/2012    HGB 11.8 11/30/2012    HCT 32.9* 11/30/2012    MCV 90.6 11/30/2012    MCHC 35.9 11/30/2012    RDW 14.6* 11/30/2012    PLT 48* 11/30/2012    MPV 10.3 11/30/2012       Lab Results   Component Value Date    NA 133* 11/30/2012    K 4.1 11/30/2012    CL 103 11/30/2012    BICARB 22 11/30/2012    BUN 12 11/30/2012    CREAT 0.70 11/30/2012    GLU 183* 11/30/2012    [place] 8.6* 11/30/2012       No results found for this basename: BNP, PHOS, MG, LACTATE, AMMONIA, IONCA, ARTIONCA       Lab Results   Component Value Date    TROPONIN <0.01 11/30/2012       No results found for this basename: PH, PCO2, O2CONTENT, IVHC3, IVBE, O2SAT, UNPH, UNPCO2, ARTPH, ARTPCO2, ARTO2CNT, IAHC3, IABE, ARTO2SAT, UNAPH, UNAPCO2       No results found for this visit on 11/30/12.    RADIOLOGIC STUDIES NOT COMPLETED: none  (none unless otherwise noted)    Active PICC Line / CVC Line / PIV Line / Drain /  Airway / Intraosseous Line / Epidural Line / ART Line / Line Type / Wound    Name: Placement date: Placement time: Site: Days:    Peripheral IV - 20 G Right Wrist 11/30/12  0112  Wrist  less than 1            Any significant events and interventions with responses:  O2 sat 88% room air, O2 via nc 3l      Floor nurse informed that report is ready for review.  Opportunity to answer questions with floor RN face to face or by phone. ER number is [id number] . ER RN Davonna Belling to be contacted for any questions.    Has this report been updated?  yes  By Who:   Time:   Additional information by updated user:

## 2012-11-30 NOTE — Plan of Care (Addendum)
Problem: Breathing Pattern - Ineffective  Goal: Respiratory rate, rhythm and depth return to baseline  Outcome: Met  PT breathing well on room air. Crackles to auscultation but saturating well.     Problem: Discharge Planning  Goal: Participation in care planning  Outcome: Met  Pt and family very active in care.     Problem: Alteration in Blood Glucose  Goal: Glucose level within specified parameters  Outcome: Met  Blood sugars less than 180 all shift.     Problem: Falls - Risk of  Goal: Absence of falls  Outcome: Met  No falls this shift. Bed in lowest position, 2/4 side rails in place, non-skid socks worn at all times.     Problem: Infection  Goal: Absence of infection signs and symptoms  Outcome: Met  No s/s of new or worsening infection. Possible pneumonia present, pt on rocephin.     Problem: Pain - Acute  Goal: Communication of presence of pain  Outcome: Met  Pt reports no pain.

## 2012-11-30 NOTE — Plan of Care (Signed)
Problem: Pain - Acute  Goal: Communication of presence of pain  Outcome: Met  Pt able to communicate episodes of pain via use of call light. Current pain assessment indicates pt not experiencing pain at this time. Will cont to assess pain lvls.

## 2012-11-30 NOTE — Plan of Care (Addendum)
Problem: Alteration in Blood Glucose  Goal: Glucose level within specified parameters  Outcome: Met  Patient FSBS 139 and no sliding scale insulin required. Patient received 10 units of scheduled Lantus.     Problem: Infection  Goal: Absence of infection signs and symptoms  Outcome: Met  Patient remains afebrile, vital signs stable, and no apparent distress noted.     Problem: Pain - Acute  Goal: Communication of presence of pain  Outcome: Met  Patient demonstrates ability to verbalize presence of pain and currently denies acute onset of episodal pain. Patient encouraged to utilize call light regarding need for assistance. Patient verbalized understanding. RN will cont to assess.

## 2012-11-30 NOTE — ED Notes (Signed)
Blood samples collected/labeled and sent to the lab.

## 2012-11-30 NOTE — Plan of Care (Signed)
Text paged 1st to call informing of patient status saying, "Good evening. Re: Paula Manning 1610. FYI, placed back on 2 L NC, breath sounds displays crackles all lung fields. Thank you so much."

## 2012-11-30 NOTE — Plan of Care (Signed)
Patient report given to [facility] RN regarding POC, assessment, meds/labs. Care endorsed. Patient in no apparent distress.

## 2012-11-30 NOTE — H&P (Signed)
HISTORY AND PHYSICAL    Attending MD:   Ivor Costa , MD    Chief Complaint:  Productive cough    Pain Assessment:  Pain Score: 0  Pain Location: Chest ("tightness")  Pain Description: Dull    History of Present Illness:     Paula Manning is a 72 year old Spanish-speaking only female h/o HCV cirrhosis on the transplant list with ascites who presents to the ED with productive cough and SOB. The patient reports development of a non-productive cough 2 days ago with worsening shortness of breath and chest tightness. She denies fevers, chills and other URI symptoms. She has been using lozenges with no improvement in her symptoms. She has some nausea/vomiting after coughing and reports abdominal pain with cough at the site of a known hernia. She reports her husband has similar symptoms.     On ROS, she reports LE edema which has improved over the last 2 days. She is unsure if she has gained or lost weight.    In the ED, she was noted to have an oxygen saturation of 88%/RA and was started on Azithromycin.    Past Medical and Surgical History:  Past Medical History   Diagnosis Date    Migraine headache     Glaucoma     Ascites 06/29/2007     History of paracentesis x 2    Pancreatic cyst 06/29/2007    Cirrhosis 06/24/2007     Active on the liver transplant waiting list with a blood type of O positive. 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    Type II or unspecified type diabetes mellitus with peripheral circulatory disorders, not stated as uncontrolled(250.70) 06/10/2007    HTN 06/10/2007    HTN 06/10/2007     Past Surgical History   Procedure Laterality Date    Pb laps surg cholecstc w/expl common duct  1970s     in [place];  open CCX    Pb cesarean delivery only      Pb cstoplasty/cstourtp plstc any       Bladder  suspension       Allergies:  Allergies   Allergen Reactions    Advil (Ibuprofen Micronized) Other     Intolerance           Medications:  Bacitracin  Lasix 20 mg Qday  Glipizide 10 mg BID  Vicodin Q6h PRN  Glargine 10 mg   MV  Zofran 4 mg Q8 PRN  Propanolol 10 mg TID  Spironolactone 50 mg Qday  Tramadol  Vitamin D    Social History:  History     Social History    Marital Status: Married     Spouse Name: N/A     Number of Children: N/A    Years of Education: N/A     Social History Main Topics    Smoking status: Former Smoker -- 10 years    Smokeless tobacco: Never Used    Alcohol Use: No    Drug Use: Not on file    Sexually Active: Not on file     Other Topics Concern    Not on file     Social History Narrative    No narrative on file       Family  History:  Noncontributory    Review of Systems:  10 point ROS negative other than the HPI and PMH    Physical Exam:  BP 132/38  Pulse 73  Temp(Src) 98.6 F (37 C)  Resp 15  Ht 5\' 5"  (1.651 m)  Wt 58.968 kg (130 lb)  BMI 21.63 kg/m2  SpO2 96%  General: Pleasant, coughing, no respiratory distress  HEENT: O/P clear, EOMI, PERRL  Neck: No cervical lymphadenopathy  CV: RRR, nrml S1/S2, no M/R/G  Pulm: Bronchial breath sounds, otherwise clear  Abd: Normoactive bowel sounds, NT/ND no guarding or rebound  Ext: Trace peripheral pitting edema  Neuro: Moving all extremities, normal gait    Labs and Other Data:  Lab Results   Component Value Date    NA 133* 11/30/2012    K 4.1 11/30/2012    CL 103 11/30/2012    BICARB 22 11/30/2012    BUN 12 11/30/2012    CREAT 0.70 11/30/2012    GLU 183* 11/30/2012    [place] 8.6* 11/30/2012     Lab Results   Component Value Date    WBC 7.2 11/30/2012    HGB 11.8 11/30/2012    HCT 32.9* 11/30/2012    PLT 48* 11/30/2012    SEG 89* 11/30/2012    LYMPHS 7* 11/30/2012    MONOS 4* 11/30/2012     Lab Results   Component Value Date    INR 1.7 11/30/2012    PTT 32.4 11/30/2012     Lab Results   Component Value Date    TROPONIN <0.01 11/30/2012       Imaging:  CXR  (11/29/12):  Left lower lobe partial collapse/ dense atelectasis is present.  No other evidence for airspace disease.  Cardiac silhouette is unremarkable.  Osseous structures are intact.    Assessment and Care Plan:  In summary, Paula Manning is a 72 yo woman h/o  HCV cirrhosis on the transplant list with ascites who presents to the ED with productive cough and SOB concerning for community-acquired PNA.    # Community-Acquired PNA: LLL atelectasis vs. Collapse, non-productive cough.  --Azithromycin/Ceftriaxone  --Respiratory culture    # Hepatitis C Cirrhosis: On transplant list  --Continue Lasix 20 mg Qday and Spironolactone 50 mg Qday  --Continue Propanolol 10 mg TID for distal EV  --Continue Rifaximin 550 mg Q12  --Tramadol for pain control    # Type 2 DM:  --Glargine 10 units  --ISS  --HgA1c    # FEN/GI: Carb limited diet  # Prophy: Heparin 5000 units Q12h  # Code: FC/FC    This plan and alternatives have been discussed with the patient and/or surrogate.    This patient was seen and discussed with Dr. Vonda Antigua who agrees with the assessment and plan above.    Code Status:    Code Status  Full Code - Call Code    The patient's primary care physician or clinic has not been contacted regarding this admission.    Note Author: Georgetta Haber, 11/30/2012, 3:41 AM      .      Medicine Attending Addendum    Patient seen, interviewed, examined with resident.  Agree with above documentation.  Noted history of HCV cirrhosis on transplant list who presents with worsening cough, shortness of breath concerning for CAP with sick contacts of her husband.  Noted mild hypoxemia in the ED of 88% on RA .  Exam consistent with mild bronchial breath sounds without clear crackles/wheeze.     #  Hypoxia:  Agree with treatment for CAP with azithromycin, ceftriaxone  #hepatitis:  Continue outpatient medications  #DM:  Lantus, ISS,     Gwynneth Munson, M.D.  Hospital Medicine Attending  PID [phone]

## 2012-11-30 NOTE — Plan of Care (Signed)
Text paged 1st to call regarding patient displaying persistent weak cough and requests cough suppressant. Awaiting MD orders.

## 2012-12-01 LAB — ECG 12-LEAD
QRS INTERVAL/DURATION: 72 ms
VENTRICULAR RATE: 74 {beats}/min

## 2012-12-01 LAB — MRSA SURVEILLANCE CULTURE

## 2012-12-01 MED ORDER — MOXIFLOXACIN HCL 400 MG OR TABS
400.0000 mg | ORAL_TABLET | Freq: Every day | ORAL | Status: DC
Start: 2012-12-01 — End: 2012-12-01
  Administered 2012-12-01: 400 mg via ORAL
  Filled 2012-12-01: qty 1

## 2012-12-01 MED ORDER — LEVOFLOXACIN 500 MG OR TABS
500.00 mg | ORAL_TABLET | Freq: Every day | ORAL | Status: AC
Start: 2012-12-01 — End: 2012-12-08

## 2012-12-01 MED ORDER — BENZONATATE 100 MG OR CAPS
100.0000 mg | ORAL_CAPSULE | Freq: Three times a day (TID) | ORAL | Status: DC | PRN
Start: 2012-12-01 — End: 2013-11-01

## 2012-12-01 NOTE — Plan of Care (Signed)
Problem: Breathing Pattern - Ineffective  Goal: Respiratory rate, rhythm and depth return to baseline  Outcome: Met  Pt receiving oxygen 1L NC.  Placed on RA, desat to Sat >87%. Per PT, pt also desat to 85% while ambulating without oxygen.BP 137/42  Pulse 63  Temp(Src) 98 F (36.7 C)  Resp 19  Ht 5\' 5"  (1.651 m)  Wt 57.9 kg (127 lb 10.3 oz)  BMI 21.24 kg/m2  SpO2 93%  No s/s of respiratory distress, DOE present. Breathing is regular and unlabored. Chest expansion is symmetrical. Crackles noted bibasilar during auscultation. Pt able to reposition self in bed. Will continue to monitor pt.    Problem: Discharge Planning  Goal: Participation in care planning  Outcome: Met  Patient has been cooperative and involved in care. Patient verbalizes understanding of current plan of care and medications. Patient is currently receiving antibiotics for PNA. No orders for discharge at this time. Patient and family are active participants in plan of care. Participating in care rounds and coordinating care with interdisciplinary teams. Will continue to monitor pt for discharge readiness.        Problem: Alteration in Blood Glucose  Goal: Glucose level within specified parameters  Outcome: Met  Lab Results   Component Value Date     GLUCPOCT 98 12/01/2012   No sliding scale required. Patient's meal tray is present, "on carb limited diet, pt has good appetite. Will continue to monitor blood glucose levels and assess pt for signs of hypo/hyperglycemia and provide interventions as ordered per MD.     Problem: Falls - Risk of  Goal: Absence of falls  Outcome: Met  Patient's bed at lowest setting with side rails upx2. Patient's bed brakes on. Patient's call light and bedside table within reach; encouraged to call for assistance when needed. Patient's bed alarms on. Nonskid socks being worn. Pt was found up by herself upon my arrival to morning assessment. Education on fall prevention plan given Pt verbalized knowledge regarding fall  prevention plan. Pt states she will call for help before getting out of bed. Pt understands importance of fall prevention plan compliance. Pt has no injuries r/t fall. Will continue ongoing fall prevention plan.

## 2012-12-01 NOTE — Plan of Care (Addendum)
1559: Paged 1st to call to clarify if pt going home or SNF on discharge. Pt O2sat down to 85% when amb with PT. CM stated she qualifies for SNF, pt agrees to go.  1615: Pt ambulated around unit for 2 full laps without oxygen, tolerated well, O2sat 94-96%. Pt denied any dizziness or weakness. Spoke with MD who confirmed patient can be discharged home with Home Health.   1625: Spoke with Case Manager, Home Health is already set up. Will provide information to patient and family along with discharge instructions.

## 2012-12-01 NOTE — Discharge Summary (Signed)
Patient Name:  Paula Manning    Principal Diagnosis (required):  CAP (community acquired pneumonia)    Hospital Problem List (required):  Active Hospital Problems    Diagnosis    *CAP (community acquired pneumonia) [486]    Hypoxia [799.02]    Hypertensive disorder [401.9]    Cirrhosis of liver [571.5]    DM circ dis type II [250.70, 443.81]      Resolved Hospital Problems    Diagnosis   No resolved problems to display.       Additional Hospital Diagnoses ("rule out" or "suspected" diagnoses, etc.):  None    Principal Procedure During This Hospitalization (required):  chest xray    Other Procedures Performed During This Hospitalization (required):  None    Procedure results are available in Chart Review in Epic.  For those providers external to [facility], the key procedure results are listed below:  CXR 11/29/12  FINDINGS:  Left lower lobe partial collapse/ dense atelectasis is present.    No other evidence for airspace disease.    Cardiac silhouette is unremarkable.    Osseous structures are intact.      Consultations Obtained During This Hospitalization:  Physical Therapy    Key consultant recommendations:  Recommend continued PT at home.     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 The patient reports development of a non-productive cough 2 days ago with worsening shortness of breath and chest tightness. She denies fevers, chills and other URI symptoms. She has been using lozenges with no improvement in her symptoms. She has some nausea/vomiting after coughing and reports abdominal pain with cough at the site of a known hernia. She reports her husband has similar symptoms.   On ROS, she reports LE edema which has improved over the last 2 days. She is unsure if she has gained or lost weight.   In the ED, she was  noted to have an oxygen saturation of 88%/RA and was started on Azithromycin.      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     # Community-Acquired PNA: Patient cxr on admission consistent with LLL atelectasis vs. Collapse and patient had a  non-productive cough. Patient was started on po azithro and IV ceftriaxone and transitioned to po moxifloxacin by HD 2.  Patient remained afebrile with oxygen via NC for comfort only with no evidence of desaturation.  Patient's respiratory status was otherwise stable at the time of discharge and saturating well on room air and afebrile. Patient was arranged for home PT at the time of discharge.     # Hepatitis C Cirrhosis: Chronic problem on admission for patient, she is on the transplant list.  Restarted on all home medications without any need for adjustments while in house and was otherwise in stable condition at the time of discharge and advised patient to follow up with her hepatologist as an outpatient.      # Type 2 DM: patient's last hgb a1c was 7.2 (10/2011) and on admission was 6.0.  Patient's sugars were well controlled during this hospital admission on glargine and SSI.  Patient  was otherwise stable at the time of discharge and advised to follow up with PMD as an outpatient.       Tests Outstanding at Discharge Requiring Follow Up:  None    Discharge Condition (required):  Stable.    Key Physical Exam Findings at Discharge:  General: Pleasant, coughing, 2lnc, spainish speaking only   CV: RRR, nrml S1/S2, no M/R/G   Pulm: coarse breath sounds b/l with intermittent crackles throughout all posterior lung fields   Abd: Normoactive bowel sounds, NT/ND no guarding or rebound   Ext: Trace peripheral pitting edema   Neuro: Moving all extremities      Discharge Diet:  Diabetic /  low-carbohydrate.    Discharge Medications:  Current Discharge Medication List      START taking these medications    Details   benzonatate (TESSALON) 100 MG capsule Take 1 capsule by mouth 3 times daily as needed for Cough.  Qty: 60 capsule, Refills: 0    Associated Diagnoses: CAP (community acquired pneumonia)      levofloxacin (LEVAQUIN) 500 MG tablet Take 1 tablet by mouth daily.  Qty: 7 tablet, Refills: 0    Associated Diagnoses: CAP (community acquired pneumonia)         CONTINUE these medications which have NOT CHANGED    Details   bacitracin 500 UNIT/GM ointment Apply small amount to affected area on face twice daily  Qty: 1 Tube, Refills: 1    Associated Diagnoses: Nasal bone fx-closed      CALCIUM CARBONATE 600 MG OR TABS 1 tablet 2 times daily  Qty: one month, Refills: 11    Associated Diagnoses: Osteopenia      furosemide (LASIX) 20 MG tablet Take 1 tablet by mouth daily.  Qty: 60 tablet, Refills: 0    Associated Diagnoses: Ascites      glipiZIDE (GLUCOTROL) 10 MG tablet Take 1 tablet by mouth 2 times daily.  Qty: 60 tablet, Refills: 3    Associated Diagnoses: Type II or unspecified type diabetes mellitus with peripheral circulatory disorders, not stated as uncontrolled(250.70)      HYDROcodone-acetaminophen (VICODIN) 5-500 MG per tablet Take 1 tablet by mouth every 6 hours as needed for Moderate Pain (Pain Score 4-6) or Severe Pain (Pain Score 7-10).  Qty: 30 tablet, Refills: 0    Associated Diagnoses: Abdominal pain      insulin glargine (LANTUS) 100 UNIT/ML injection Inject 10 Units under the skin nightly.  Qty: 1 Vial, Refills: 2    Associated Diagnoses: Type II or unspecified type diabetes mellitus with peripheral circulatory disorders, not stated as uncontrolled(250.70)      multivitamin (MULTI-VITAMIN) tablet Take 1 tablet by mouth daily.      ondansetron (ZOFRAN) 4 MG tablet Take 1 tablet by mouth every 8 hours as needed for Nausea/Vomiting.  Qty: 15 tablet, Refills: 0    Associated Diagnoses:  Abdominal pain      propranolol (INDERAL) 10 MG tablet Take 1 tablet by mouth 3 times daily.  Qty: 60 tablet, Refills: 0    Associated Diagnoses: Esophageal varices      rifaximin (XIFAXAN) 550 MG tablet Take 550 mg by mouth every 12 hours.      spironolactone (ALDACTONE) 25 MG tablet Take 2 tablets by mouth daily.  Qty: 60 tablet, Refills: 0    Associated Diagnoses: Ascites      traMADol (ULTRAM) 50 MG tablet Take 1 tablet by mouth every 6 hours as needed for Moderate Pain (Pain Score 4-6).  Qty: 30  tablet, Refills: 0    Associated Diagnoses: S/P thoracentesis      VITAMIN D 400 UNIT OR CAPS 1 CAPSULE DAILY  Qty: 30, Refills: 11    Associated Diagnoses: Osteopenia             Allergies:  Allergies   Allergen Reactions    Advil (Ibuprofen Micronized) Other     Intolerance           Discharge Disposition:  Home.    Discharge Code Status:  Full code / full care  This code status is not changed from the time of admission.    Follow Up Appointments:    Scheduled appointments:  Future Appointments  Date Time Provider Department Center   02/04/2013 10:00 AM Baltazar Apo, NP [facility]       For appointments requested for after discharge that have not yet been scheduled, refer to the Post Discharge Referrals section of the After Visit Summary.    Discharging Physician's Contact Information:  [facility] operator at [phone].

## 2012-12-01 NOTE — Discharge Instructions (Signed)
Diagnosis and Reason for Admission    You were admitted to the hospital for the following reason(s):  pneumonia    Your full diagnosis list is located on this After Visit Summary in the Hospital Problems section.    What Happened During Your Hospital Stay    The main tests and treatments done for you during this hospitalization were:    Chest xray and blood work    The following evaluation is still important to complete after discharge from the hospital:  none    Instructions for After Discharge    Your diet at home should be a diabetic (low-sugar) diet.    Your activity level at home should be:  as much exercise or activity as you can tolerate.    Specific activity restrictions:    None    Wound or tube care instructions:  None    Your medication list is located on this After Visit Summary in the Current Discharge Medication List section.  Your nurse will review this information with you before you leave the hospital.    It is very important for you to keep a current medication list with you in order to assist your doctors with your medical care.  Bring this After Visit Summary with you to your follow up appointments.    Reasons to Contact a Doctor Urgently    Call 911 or return to the hospital immediately if:  you experience any chest pain, shortness of breath/difficulty breathing, intractable abdominal pain, intractable nausea, vomiting or diarrhea, altered mental status or any other life threatening issue.      You should contact either your primary care physician or your hospital physician for any of the following reasons: as above    If you have any questions about your hospital care, your medications, or if you have new or concerning symptoms soon after going home from the hospital, and you need to contact your hospital physician, your hospital physician can be contacted in the following manner:  [facility] operator at [phone].    Once you are able to see your primary care physician (PCP), your  PCP will then be responsible for further medication refills, or appointment referrals.    What Needs to Happen Next After Discharge -- Appointments and Follow Up    Any appointments already scheduled at [facility] clinics will be listed in the Future Appointments section at the top of this After Visit Summary.  Any appointments that have been requested, but have not yet been scheduled, will be listed below that under Post Discharge Referrals.    Sometimes tests performed in the hospital do not yet have results by the time a patient goes home.  The following key tests will need to be followed up at your next appointment: None    Medical Home Information    Your primary care provider or clinic currently on file at [facility] is: [facility].    If we are assigning you a new medical home for your follow up after discharge, that information will appear here:       Specific timing of follow-up:  Please schedule an appointment with  Rene Paci H. in 1-2 days after discharge. A referral has been placed for this appointment and the office should call to schedule an appointment with you. If you do not receive a phone call within the next week please call to schedule an appointment. [facility] Operator [phone].      Additional Discharge Information (  if applicable)     Please take your antibiotics as scheduled and do not miss a dose. Please complete the entire course of antibiotics even if you are feeling better.         Handouts Given to You (if applicable)

## 2012-12-01 NOTE — Plan of Care (Signed)
Problem: Infection  Goal: Absence of infection signs and symptoms  Outcome: Met  Pt afebrile and HR WNL during today's shift. Standard precautions followed and environment kept clean with frequent wipe downs. Patient has had no fever over night. Antibiotics transitioned to po today as ordered.  Vital signs are stable:   Blood pressure 137/42, pulse 63, temperature 98 F (36.7 C), resp. rate 19, height 5\' 5"  (1.651 m), weight 57.9 kg (127 lb 10.3 oz), SpO2 93.00%.   Lab Results   Component Value Date     WBC 4.2 12/01/2012     RBC 3.57 12/01/2012     HGB 11.7 12/01/2012     HCT 32.6 12/01/2012     MCV 91.3 12/01/2012     MCHC 35.9 12/01/2012     RDW 14.9 12/01/2012     PLT 41 12/01/2012     PLT 47 04/23/2009     MPV 9.8 12/01/2012     SEG 66 12/01/2012     SEG 70 05/18/2008     LYMPHS 16 12/01/2012     LYMPHS 16 05/18/2008     MONOS 16 12/01/2012     MONOS 6 05/18/2008     EOS 2 12/01/2012     EOS 1 05/18/2008     BASOS 1 01/20/2012     BASOS 1 06/03/2004   Will continue to monitor patient closely for s/s of infection.        Problem: Pain - Acute  Goal: Communication of presence of pain  Outcome: Met  Patient denied any pain this shift.  Will continue to assess and offer comfort measures as needed.    Problem: Skin Integrity- Intact patient high risk for impaired skin integrity Braden scale less than or equal to 18  Goal: Skin remains intact  Outcome: Met  Patient' reminded to turn self every two hours to prevent pressure related skin issues. Patient clear from pressure related skin issues. No redness noted. Continuing to assess skin per hospital protocol. Will continue to monitor patient frequently.    Problem: Tissue Perfusion - Cardiopulmonary, Altered  Goal: Hemodynamic stability  Outcome: Met  Patient denies any chest pain or sob, vital signs remain stable; currently Blood pressure 137/42, pulse 63, temperature 98 F (36.7 C), resp. rate 19, height 5\' 5"  (1.651 m), weight 57.9 kg (127 lb 10.3 oz), SpO2 93.00%. Pt on cardiac  monitor, showing NSR HR in the 60's. Will continue to monitor closely.

## 2012-12-01 NOTE — Plan of Care (Signed)
1715: Discharge order in place. Provided discharge pt education. Patient verbalizes understanding of plan of care and medications after leaving the hospital, including importance of taking whole course of antibiotics. Pt verbalized understanding about s/s of worsening of infection, changes in mental and respiratory status and what to do in those cases. Educated pt on activity level, follow up appointments. Family present during discharge instruction. Pt instructed to pick up prescription on pharmacy downstairs. Pt denies any further needs at this time.  1750: transport personnel took pt downstairs safely by wheelchair. All belongings accounted for. Discharge completed.

## 2012-12-01 NOTE — Plan of Care (Signed)
Patient report given to [facility] RN regarding POC, assessment, medications/labs. Care endorsed. Patient accompanied by family member, no apparent distress, VSS.

## 2012-12-01 NOTE — Interdisciplinary (Signed)
PHYSICAL THERAPY EVALUATION NOTE    Admission Information    Reasons for Admission:  [name] is a 72 yo woman h/o  HCV cirrhosis on the transplant list with ascites who presents to the ED with productive cough and SOB concerning for community-acquired PNA. MD CONSULT TO PT FOR PENDING DISCHARGE TODAY.  Reasons for Therapy: Difficulty with mobility  Mobility: Independent with mobility using assistive device  Type of Home: House  Home Layout: One level  Living Arrangements: Spouse / significant other (husband/Paula Manning [phone])  Home Equipment: 4-wheeled walker    Objective Assessment    Parameters:    Type of Eval: Evaluation  Start Treatment Time: 0930  End Treatment Time: 1015  Additional  Precautions/Contrandications:: Fall Risk  RLE: No Precautions  LLE: No Precautions  Equipment Present: Tele;IV;Oxygen  Pain Score (0-10): 0    Exam:    Oral Exam: No deficits  Visual: no deficits  Auditory: WNL  Level of Consciousness: Alert  Orientation Level: Oriented X4  Safety: Fair  Follows Commands: Multistep  Neglect: None  Spasticity: none  Tone: normal    Mobility Assessment:    Rolling: supervision  Supine - Sit: min assist  Sit - Supine: min assist  Sit - Stand: supervision  Stand - Sit: supervision  Chair Transfer: supervision  Ambulation: supervision  Feet Ambulated: 100  Description of Gait: shuffling gait pattern, fatigues easily, SP02 to 85% on room air, required 2L02 to recover to >92%. Family demos poor safety when assisting pt, son stating he takes her for long walks around the unit without 02, educated that family and pt must  call for assist for OOB as pt HIGH FALL RISK (fall approx 6 months ago pt broke her nose per son)   Equipment Needed: Other (comment) (4WN)  Static Balance - Sitting: Good  Static Balance - Standing: Fair  Dynamic Balance - Sitting: Fair  Dynamic Balance - Standing: Fair  Comments: sitting: HR=63, SP02 93% on room air, RR 18, BP=118/51; standing BP= 95/52, SP02 97% on room  air. SP02 > 92% on room iair with activity, then SP02 to 85% with ambulation, required 2L 02 by nc for ambulation.  Pt with wet cough throughout PT session.   Activity Tolerance: poor    Lower Extremity Assessment       Lower Extremity Comments: generalized weakness BLE grossly 4/5, fatigues easily       Treatment Plan  The goals listed below are achievable and realistic within the designated time frame and the treatments listed below, and referred to in the treatment plan, are necessary to achieve these goals. The functional goals were created based on the patient's prior level of function.    Diagnosis: difficulty ambulating  Assessment: Paula Manning is a 72 yo woman h/o  HCV cirrhosis on the transplant list with ascites who presents to the ED with productive cough and SOB concerning for community-acquired PNA. MD CONSULT TO PT FOR PENDING DISCHARGE TODAY. Pt limited by dec SP02 during ambulation, dec activity tolerance, dec balance, generalized weakness, HIGH FALL RISK and poor safety demo by pt and family. Pt will benefit from physical therapy to improve ambulation, transfers, strength, ROM, balance, activity tolerance, and increase independent functional mobility for safe return to PLOF and safe discharge plan. Recommend pt OOB with nursing staff as tolerated to increase activity tolerance and focus on higher level functional mobility with PT. Patient is high fall risk, safety risk and will require supervision upon discharge. Patient would benefit  from further PT services following discharge from the hospital.  Problems: Functional mobility impairment;Strength impairment;Activity tolerance impairment;Safety impairment     Goal #1: Pt supine to sit flat bed no siderails, superv  # Visits: 1-3  Progress: New  Goal #2: Transfer bed to chair with good safety mod indep  # Visits: 3-5  Progress: New  Goal #3: Pt amb with LRAD x 150 ft superv, monitor 02  # Visits: 5-7  Progress: New  Equipment Recommendations: has own  equipment  Interventions: Gait training;Graded tasks to increase function;Functional mobility training;Balance activities;Safety instruction;Therapeutic exercise;Family training;Patient exercise program instruction  Frequency: 3-5 times/wk

## 2012-12-01 NOTE — Progress Notes (Addendum)
Inpatient Progress Note  Family Medicine      Patient Name: Paula Manning  MRN: [id number]             Room#:1107/1107    Current Length of Stay:  LOS: 1 day     SUBJECTIVE:   Overnight patient requiring 2L NC for o2 sats above 92% per nursing. Otherwise no overnight events. This am patient notes some discomfort around her ventral hernia from coughing so much, cough continues to be non productive. Patient remains afebrile, normotensive.     Medications:  Scheduled:   azithromycin  250 mg Daily    cefTRIAXone (ROCEPHIN) IVPB  1,000 mg Q24H NR    furosemide  20 mg Daily    [DISCONTINUED] heparin  5,000 Units Q12H    insulin glargine  10 Units Nightly    insulin lispro  1-10 Units 4x Daily WC    multivitamin  1 tablet Daily    propranolol  10 mg TID    rifaximin  550 mg Q12H    sodium chloride (PF)  3 mL Q8H    spironolactone  50 mg Daily     PRN:   acetaminophen  650 mg Q4H PRN    benzonatate  100 mg TID PRN    dextrose  12.5 g PRN    glucagon  1 mg Once PRN    glucose 40%  1 Tube PRN    glucose  4 tablet PRN    ondansetron  4 mg Q6H PRN    sodium chloride (PF)  3 mL PRN    sodium chloride   Continuous PRN    traMADol  50 mg Q6H PRN     OBJECTIVE:  Vital Signs:  Temperature:  [97.5 F (36.4 C)-99.2 F (37.3 C)] 98.8 F (37.1 C) (04/23 0400)  Blood pressure (BP): (110-147)/(33-86) 126/54 mmHg (04/23 0600)  Heart Rate:  [59-74] 61 (04/23 0600)  Respirations:  [14-22] 16 (04/23 0600)  Pain Score:  [-] 0 (04/23 0600)  O2 Device:  [-] Nasal cannula (04/23 0600)  O2 Flow Rate (L/min):  [1 l/min-2 l/min] 1 l/min (04/23 0600)  SpO2:  [94 %-99 %] 97 % (04/23 0600)    Intake and Output  04/22 0600 - 04/23 0559  In: 1256 [P.O.:1200; I.V.:56]  Out: - x7 voids    Wt Readings from Last 1 Encounters:   12/01/12 57.9 kg (127 lb 10.3 oz)     Physical Exam:  Active PICC Line / CVC Line / PIV Line / Drain / Airway / Intraosseous Line / Epidural Line / ART Line / Line Type / Wound    Name: Placement date:  Placement time: Site: Days:    Peripheral IV - 20 G Right Wrist 11/30/12  0112  Wrist  1        General: Pleasant, coughing, 2lnc, spainish speaking only  CV: RRR, nrml S1/S2, no M/R/G   Pulm: coarse breath sounds b/l with intermittent crackles throughout all posterior lung fields  Abd: Normoactive bowel sounds, NT/ND no guarding or rebound   Ext: Trace peripheral pitting edema   Neuro: Moving all extremities    Labs:  CBC:   Recent Labs      11/30/12   0108  11/30/12   0605   WBC  7.2  6.4   HGB  11.8  11.1*   HCT  32.9*  31.5*   PLT  48*  44*   MCV  90.6  91.6   SEG  89*  86*   LYMPHS  7*  8*      Chemistry:  Recent Labs      11/30/12   0108  11/30/12   0605  11/30/12   1800   NA  133*  136  132*   K  4.1  4.3  4.0   CL  103  104  102   BICARB  22  24  23    BUN  12  13  13    CREAT  0.70  0.77  0.70   GLU  183*  173*  101   [place]  8.6*  8.6*  8.3*   MG   --   1.7   --    PHOS   --   2.0*   --    TP   --   7.0   --    ALB   --   3.1*   --    AST   --   34*   --    ALT   --   27   --      Coags:   Recent Labs      11/30/12   0108  11/30/12   0605   PT  18.5*  18.5*   PTT  32.4   --    INR  1.7  1.8     Blood SugarsNo Data Recorded    Radiology  CXR  FINDINGS:  Left lower lobe partial collapse/ dense atelectasis is present.    No other evidence for airspace disease.    Cardiac silhouette is unremarkable.    Osseous structures are intact.          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     # Community-Acquired PNA: LLL atelectasis vs. Collapse, non-productive cough.   --Azithromycin/Ceftriaxone, consider switching to all po medications, possible switch to fluroquinolone.    -- wean O2 today    # Hepatitis C Cirrhosis: On transplant list   --Continue Lasix 20 mg Qday and Spironolactone 50 mg Qday   --Continue Propanolol 10 mg TID for distal EV   --Continue Rifaximin 550 mg Q12   --Tramadol for pain control     # Type 2 DM:   --Glargine 10 units   --ISS   --HgA1c    #  FEN/GI  [employer] Standard    #Prophylaxis: 5000 u heparin sc.     # Code Status: Full Code    Dispo - home today on 7 days of moxifloxacin     Valetta Close, MD PGY1    Pt seen and discussed with attending    ATTENDING PROGRESS NOTE ATTESTATION    Subjective    I have reviewed the history.  Interval history:  Weakness and dry cough - no chest pain.     Objective    I have examined the patient and concur with the resident exam.    Assessment and Plan    I agree with the resident care plan. Clinically doing better, no fever, minimal O2 requirement. Stable for DC - may need SNF or can go home with PT.    See the resident note for further details.

## 2012-12-01 NOTE — Interdisciplinary (Addendum)
Patient lacks the 3 day length of stay for Medicare to cover SNF placement. Patient medically stable and ready to discharge. CM set up home health for patient.    [facility] Intake [phone] for home health PT and RN. Patient has 4WW w/seat at the bedside.

## 2012-12-02 NOTE — Interdisciplinary (Signed)
 Home health order and dc summary faxed to [facility] [phone] ph.  Liem Copenhaver Placement Coord. [id number]

## 2012-12-06 ENCOUNTER — Emergency Department
Admission: EM | Admit: 2012-12-06 | Discharge: 2012-12-07 | Disposition: A | Discharge status: Home / Self Care | Payer: Medicare Other | Attending: Emergency Medicine | Admitting: Emergency Medicine

## 2012-12-06 DIAGNOSIS — R059 Cough, unspecified: Secondary | ICD-10-CM | POA: Insufficient documentation

## 2012-12-06 DIAGNOSIS — K7460 Unspecified cirrhosis of liver: Secondary | ICD-10-CM | POA: Insufficient documentation

## 2012-12-06 DIAGNOSIS — B1920 Unspecified viral hepatitis C without hepatic coma: Secondary | ICD-10-CM | POA: Insufficient documentation

## 2012-12-06 DIAGNOSIS — Z7682 Awaiting organ transplant status: Secondary | ICD-10-CM | POA: Insufficient documentation

## 2012-12-06 DIAGNOSIS — Z87891 Personal history of nicotine dependence: Secondary | ICD-10-CM | POA: Insufficient documentation

## 2012-12-06 DIAGNOSIS — Z8701 Personal history of pneumonia (recurrent): Secondary | ICD-10-CM | POA: Insufficient documentation

## 2012-12-06 DIAGNOSIS — R0602 Shortness of breath: Secondary | ICD-10-CM

## 2012-12-06 MED ORDER — GUAIFENESIN-CODEINE 100-10 MG/5ML OR SOLN
5.00 mL | Freq: Once | ORAL | Status: AC
Start: 2012-12-06 — End: 2012-12-06
  Administered 2012-12-06: 5 mL via ORAL
  Filled 2012-12-06: qty 5

## 2012-12-06 MED ORDER — ALBUTEROL SULFATE 108 (90 BASE) MCG/ACT IN AERS
2.00 | INHALATION_SPRAY | Freq: Once | RESPIRATORY_TRACT | Status: AC
Start: 2012-12-06 — End: 2012-12-06
  Administered 2012-12-06: 2 via RESPIRATORY_TRACT
  Filled 2012-12-06: qty 6.7

## 2012-12-07 LAB — ECG 12-LEAD
ATRIAL RATE: 60 {beats}/min
P AXIS: 86 degrees
PR INTERVAL: 156 ms
QRS INTERVAL/DURATION: 82 ms
QT: 470 ms
QTC INTERVAL: 470 ms
R AXIS: 79 degrees
T AXIS: 70 degrees
VENTRICULAR RATE: 60 {beats}/min

## 2012-12-07 MED ORDER — GUAIFENESIN-CODEINE 100-10 MG/5ML OR SOLN
5.0000 mL | Freq: Four times a day (QID) | ORAL | Status: DC | PRN
Start: 2012-12-07 — End: 2013-11-01

## 2012-12-08 NOTE — ED Follow-up Note (Signed)
 Follow-up type: Callback       Routine ED Patient Call Back    Patient contacted by telephone,  I still have my cough, and I am resting. Thank you for the call.

## 2012-12-10 ENCOUNTER — Emergency Department (EMERGENCY_DEPARTMENT_HOSPITAL)
Admission: EM | Admit: 2012-12-10 | Discharge: 2012-12-11 | Disposition: A | Discharge status: Home Health | Payer: Medicare Other | Attending: Emergency Medicine | Admitting: Emergency Medicine

## 2012-12-10 MED ORDER — INSULIN REGULAR HUMAN 100 UNIT/ML IJ SOLN
6.0000 [IU] | Freq: Once | INTRAMUSCULAR | Status: DC
Start: 2012-12-11 — End: 2012-12-10

## 2012-12-10 MED ORDER — INSULIN REGULAR HUMAN 100 UNIT/ML IJ SOLN
10.00 [IU] | Freq: Once | INTRAMUSCULAR | Status: AC
Start: 2012-12-11 — End: 2012-12-11
  Administered 2012-12-11: 10 [IU] via INTRAVENOUS
  Filled 2012-12-10: qty 10

## 2012-12-10 MED ORDER — SODIUM CHLORIDE 0.9 % IV BOLUS
1000.00 mL | INJECTION | Freq: Once | INTRAVENOUS | Status: AC
Start: 2012-12-11 — End: 2012-12-11
  Administered 2012-12-10: 1000 mL via INTRAVENOUS

## 2012-12-10 MED ORDER — INSULIN REGULAR HUMAN 100 UNIT/ML IJ SOLN
6.00 [IU] | Freq: Once | INTRAMUSCULAR | Status: AC
Start: 2012-12-11 — End: 2012-12-11
  Administered 2012-12-11: 6 [IU] via SUBCUTANEOUS
  Filled 2012-12-10 (×2): qty 6

## 2012-12-10 NOTE — ED Notes (Signed)
Dr. Merritt at bedside

## 2012-12-11 DIAGNOSIS — J90 Pleural effusion, not elsewhere classified: Secondary | ICD-10-CM

## 2012-12-11 LAB — ECG 12-LEAD
QRS INTERVAL/DURATION: 96 ms
VENTRICULAR RATE: 52 {beats}/min

## 2012-12-11 LAB — URINALYSIS
Nitrite: NEGATIVE
pH: 6 (ref 5.0–8.0)

## 2012-12-11 MED ORDER — SODIUM CHLORIDE 0.9 % IV BOLUS
1000.00 mL | INJECTION | Freq: Once | INTRAVENOUS | Status: AC
Start: 2012-12-11 — End: 2012-12-11
  Administered 2012-12-11: 1000 mL via INTRAVENOUS

## 2012-12-11 NOTE — ED Notes (Signed)
1610 - Pt xray via gurney.  Ambulated to bathroom and back with own walker with a steady gait.  Denies shortness of breath.  Bilateral lungs clear per auscultation after 2nd liter of Ns.  Family at bedside.

## 2012-12-11 NOTE — ED EKG Interpretation (Signed)
ED EKG Interpretation    EKG: sinus brady. No ectopy, normal intervals, normal axis, no ST-T changes, good RWP.

## 2012-12-11 NOTE — ED Notes (Signed)
12 lead EKG done and handed to MD Merritt,copy in pt's chart

## 2012-12-11 NOTE — ED Attending Note (Signed)
ED ATTENDING NOTE:  Pt spanish speaking. Granddaughter at bedside translating    Pt seen and examined.  Patient's history, physical and plan of care discussed with resident Dr. Lorn Junes.    72 yr old f w/ hx of pna s/p recent admission and dc on oral abx presents with hyperglycemia. Pt was recently prescribe prednisone for her residual cough.  In setting of prednisone use, she developed hyperglycemia w/ asso generalized weakness and lightheadedness. Denies headaches, f/c, focal weakness/numbness, no vision changes.  Pt denies rectal bleeding  or hematemesis    VS per triage w pox 96% RA normal  Gen: nontoxic well appearing   Neck supple, no LAD  HEENT: NCAT, clear oropharynx without erythema/edema, patent airway, perla/eomi  CV: rrr s1s2 nomrg  Pulm: CTA B   Abd: soft ntnd, no rebound, no guarding  Back: no lesions, no cvat  Ext: warm, 2+ pulses throughout, no edema  Skin: w/d  Neuro: aaox4, nl gait, strength 5/5, sensation intact, fnf intact, cn ii-xii intact  Psych: nl affect and behavior    Labs:  Chem panel with hyponatremia and hyperglycemia  U/a neg for ketones  Anemia slightly worsened compared to baseline  plts low baseline  cxr no cad    A/p:  Hyperglycemia  No dka. Clinical picture not consistent with HHNK  Sugar improved with use of IVF  Pt take sulfonylurea and lantus. No change to regimen. F/u pmd next week.   Strict return precautions  Pt is agreeable with plan

## 2012-12-11 NOTE — ED Notes (Signed)
Assisting primary RN - Pt sitting up on gurney with family at bedside. Denies any pain or discomfort. AA/ox4 speaking clear full sentences, rr full/equal/non labored. Pt cleared for DC. PIV removed slowly per pt's request, bleeding controlled, pressure dressing applied. DC instructions given & explained ot pt and family, verbalized understanding. DC home with steady gait in stable cond.

## 2012-12-12 DIAGNOSIS — J438 Other emphysema: Secondary | ICD-10-CM | POA: Diagnosis present

## 2012-12-12 DIAGNOSIS — G40909 Epilepsy, unspecified, not intractable, without status epilepticus: Secondary | ICD-10-CM | POA: Diagnosis present

## 2012-12-12 DIAGNOSIS — K7460 Unspecified cirrhosis of liver: Secondary | ICD-10-CM | POA: Diagnosis present

## 2012-12-12 DIAGNOSIS — M810 Age-related osteoporosis without current pathological fracture: Secondary | ICD-10-CM | POA: Diagnosis present

## 2012-12-12 DIAGNOSIS — E119 Type 2 diabetes mellitus without complications: Secondary | ICD-10-CM | POA: Diagnosis present

## 2012-12-12 DIAGNOSIS — E785 Hyperlipidemia, unspecified: Secondary | ICD-10-CM | POA: Diagnosis present

## 2012-12-12 DIAGNOSIS — B1920 Unspecified viral hepatitis C without hepatic coma: Secondary | ICD-10-CM | POA: Diagnosis present

## 2012-12-12 DIAGNOSIS — I8500 Esophageal varices without bleeding: Secondary | ICD-10-CM | POA: Diagnosis present

## 2012-12-12 DIAGNOSIS — Z86718 Personal history of other venous thrombosis and embolism: Secondary | ICD-10-CM

## 2012-12-12 DIAGNOSIS — Z87891 Personal history of nicotine dependence: Secondary | ICD-10-CM

## 2012-12-12 DIAGNOSIS — H409 Unspecified glaucoma: Secondary | ICD-10-CM | POA: Diagnosis present

## 2012-12-12 DIAGNOSIS — D696 Thrombocytopenia, unspecified: Secondary | ICD-10-CM | POA: Diagnosis present

## 2012-12-12 DIAGNOSIS — I10 Essential (primary) hypertension: Secondary | ICD-10-CM | POA: Diagnosis present

## 2012-12-12 DIAGNOSIS — Z794 Long term (current) use of insulin: Secondary | ICD-10-CM

## 2012-12-12 DIAGNOSIS — J90 Pleural effusion, not elsewhere classified: Principal | ICD-10-CM | POA: Diagnosis present

## 2012-12-13 ENCOUNTER — Inpatient Hospital Stay (HOSPITAL_COMMUNITY): Payer: Medicare Other | Admitting: Internal Medicine

## 2012-12-13 ENCOUNTER — Inpatient Hospital Stay
Admission: EM | Admit: 2012-12-13 | Discharge: 2012-12-14 | DRG: 187 | Disposition: A | Discharge status: Home / Self Care | Payer: Medicare Other | Attending: Internal Medicine | Admitting: Internal Medicine

## 2012-12-13 ENCOUNTER — Encounter (HOSPITAL_COMMUNITY): Payer: Self-pay | Admitting: Internal Medicine

## 2012-12-13 DIAGNOSIS — B1920 Unspecified viral hepatitis C without hepatic coma: Secondary | ICD-10-CM | POA: Diagnosis present

## 2012-12-13 DIAGNOSIS — D649 Anemia, unspecified: Secondary | ICD-10-CM

## 2012-12-13 DIAGNOSIS — J90 Pleural effusion, not elsewhere classified: Secondary | ICD-10-CM

## 2012-12-13 DIAGNOSIS — R0600 Dyspnea, unspecified: Secondary | ICD-10-CM

## 2012-12-13 DIAGNOSIS — I8500 Esophageal varices without bleeding: Secondary | ICD-10-CM | POA: Diagnosis present

## 2012-12-13 DIAGNOSIS — J439 Emphysema, unspecified: Secondary | ICD-10-CM

## 2012-12-13 DIAGNOSIS — I798 Other disorders of arteries, arterioles and capillaries in diseases classified elsewhere: Secondary | ICD-10-CM

## 2012-12-13 DIAGNOSIS — M810 Age-related osteoporosis without current pathological fracture: Secondary | ICD-10-CM | POA: Diagnosis present

## 2012-12-13 DIAGNOSIS — K7460 Unspecified cirrhosis of liver: Secondary | ICD-10-CM

## 2012-12-13 DIAGNOSIS — J918 Pleural effusion in other conditions classified elsewhere: Secondary | ICD-10-CM | POA: Diagnosis present

## 2012-12-13 DIAGNOSIS — K769 Liver disease, unspecified: Secondary | ICD-10-CM

## 2012-12-13 DIAGNOSIS — R188 Other ascites: Secondary | ICD-10-CM

## 2012-12-13 LAB — URINALYSIS
Nitrite: NEGATIVE
pH: 6.5 (ref 5.0–8.0)

## 2012-12-13 LAB — TOTAL PROTEIN, OTHER: Total Protein, Other: 1.2 g/dL

## 2012-12-13 LAB — BODY FLUID CELL CT / DIFF
Fluid RBC mm3: 1550 uL
Fluid WBC mm3: 198 uL
Lymphocytes fluid %: 50 %
Macrophages fluid %: 46 %
Mesothelial Cells Fluid %: 2 %
Neutrophils Fluid %: 2 %

## 2012-12-13 LAB — GLUCOSE (POCT)
Glucose (POCT): 272 mg/dL — ABNORMAL HIGH (ref 70–115)
Glucose (POCT): 206 mg/dL — ABNORMAL HIGH (ref 70–115)
Glucose (POCT): 313 mg/dL — ABNORMAL HIGH (ref 70–115)

## 2012-12-13 LAB — LDH, OTHER: LDH, Other: 48 IU/L

## 2012-12-13 MED ORDER — SODIUM CHLORIDE 0.9 % IV SOLN
INTRAVENOUS | Status: DC | PRN
Start: 2012-12-13 — End: 2012-12-14

## 2012-12-13 MED ORDER — GLUCAGON HCL (RDNA) 1 MG IJ SOLR
1.0000 mg | Freq: Once | INTRAMUSCULAR | Status: DC | PRN
Start: 2012-12-13 — End: 2012-12-14

## 2012-12-13 MED ORDER — ONDANSETRON HCL 4 MG/2ML IV SOLN
4.0000 mg | Freq: Four times a day (QID) | INTRAMUSCULAR | Status: DC | PRN
Start: 2012-12-13 — End: 2012-12-14

## 2012-12-13 MED ORDER — VANCOMYCIN HCL 1000 MG IV SOLR
1000.00 mg | Freq: Once | INTRAVENOUS | Status: AC
Start: 2012-12-13 — End: 2012-12-13
  Administered 2012-12-13: 1000 mg via INTRAVENOUS
  Filled 2012-12-13: qty 1000

## 2012-12-13 MED ORDER — BENZONATATE 100 MG OR CAPS
100.00 mg | ORAL_CAPSULE | Freq: Once | ORAL | Status: AC
Start: 2012-12-13 — End: 2012-12-13
  Administered 2012-12-13: 100 mg via ORAL
  Filled 2012-12-13: qty 1

## 2012-12-13 MED ORDER — DEXTROSE (DIABETIC USE) 40 % OR GEL
1.0000 | ORAL | Status: DC | PRN
Start: 2012-12-13 — End: 2012-12-14

## 2012-12-13 MED ORDER — BENZONATATE 100 MG OR CAPS
100.0000 mg | ORAL_CAPSULE | Freq: Three times a day (TID) | ORAL | Status: DC | PRN
Start: 2012-12-13 — End: 2012-12-14
  Administered 2012-12-13: 100 mg via ORAL
  Filled 2012-12-13: qty 1

## 2012-12-13 MED ORDER — SPIRONOLACTONE 25 MG OR TABS
50.0000 mg | ORAL_TABLET | Freq: Every day | ORAL | Status: DC
Start: 2012-12-13 — End: 2012-12-14
  Administered 2012-12-13 – 2012-12-14 (×2): 50 mg via ORAL
  Filled 2012-12-13 (×2): qty 2

## 2012-12-13 MED ORDER — SODIUM CHLORIDE 0.9 % IV SOLN
3375.00 mg | Freq: Once | INTRAVENOUS | Status: AC
Start: 2012-12-13 — End: 2012-12-13
  Administered 2012-12-13: 3375 mg via INTRAVENOUS
  Filled 2012-12-13: qty 3375

## 2012-12-13 MED ORDER — INSULIN GLARGINE 100 UNIT/ML SC SOLN
10.0000 [IU] | Freq: Every evening | SUBCUTANEOUS | Status: DC
Start: 2012-12-13 — End: 2012-12-14
  Administered 2012-12-13: 10 [IU] via SUBCUTANEOUS
  Filled 2012-12-13: qty 10

## 2012-12-13 MED ORDER — PROPRANOLOL HCL 10 MG OR TABS
10.0000 mg | ORAL_TABLET | Freq: Three times a day (TID) | ORAL | Status: DC
Start: 2012-12-13 — End: 2012-12-14
  Administered 2012-12-13 – 2012-12-14 (×4): 10 mg via ORAL
  Filled 2012-12-13 (×4): qty 1

## 2012-12-13 MED ORDER — SODIUM CHLORIDE 0.9 % IJ SOLN (CUSTOM)
3.0000 mL | Freq: Three times a day (TID) | INTRAMUSCULAR | Status: DC
Start: 2012-12-13 — End: 2012-12-14
  Administered 2012-12-13: 3 mL via INTRAVENOUS

## 2012-12-13 MED ORDER — RIFAXIMIN 550 MG PO TABS
550.0000 mg | ORAL_TABLET | Freq: Two times a day (BID) | ORAL | Status: DC
Start: 2012-12-13 — End: 2012-12-14
  Administered 2012-12-13 – 2012-12-14 (×3): 550 mg via ORAL
  Filled 2012-12-13 (×3): qty 1

## 2012-12-13 MED ORDER — TRAMADOL HCL 50 MG OR TABS
50.0000 mg | ORAL_TABLET | Freq: Four times a day (QID) | ORAL | Status: DC | PRN
Start: 2012-12-13 — End: 2012-12-14

## 2012-12-13 MED ORDER — SODIUM CHLORIDE 0.9 % IJ SOLN (CUSTOM)
3.0000 mL | INTRAMUSCULAR | Status: DC | PRN
Start: 2012-12-13 — End: 2012-12-14
  Administered 2012-12-14: 3 mL via INTRAVENOUS

## 2012-12-13 MED ORDER — GLUCOSE 4 GM PO CHEW (CUSTOM)
4.0000 | CHEWABLE_TABLET | ORAL | Status: DC | PRN
Start: 2012-12-13 — End: 2012-12-14

## 2012-12-13 MED ORDER — DEXTROSE 50 % IV SOLN
12.5000 g | INTRAVENOUS | Status: DC | PRN
Start: 2012-12-13 — End: 2012-12-14

## 2012-12-13 MED ORDER — INSULIN LISPRO (HUMAN) 100 UNIT/ML SC SOLN (CUSTOM)
1.0000 [IU] | Freq: Four times a day (QID) | INTRAMUSCULAR | Status: DC
Start: 2012-12-13 — End: 2012-12-14
  Administered 2012-12-13: 2 [IU] via SUBCUTANEOUS
  Administered 2012-12-13 (×2): 3 [IU] via SUBCUTANEOUS
  Administered 2012-12-14: 2 [IU] via SUBCUTANEOUS
  Filled 2012-12-13 (×2): qty 2
  Filled 2012-12-13: qty 3

## 2012-12-13 MED ORDER — FUROSEMIDE 20 MG OR TABS
20.0000 mg | ORAL_TABLET | Freq: Every day | ORAL | Status: DC
Start: 2012-12-13 — End: 2012-12-14
  Administered 2012-12-13 – 2012-12-14 (×2): 20 mg via ORAL
  Filled 2012-12-13 (×2): qty 1

## 2012-12-13 NOTE — ED Procedure Note (Signed)
Procedure Note  Thoracentesis  Date/Time: 12/13/2012 8:33 AM  Performed by: Danie Binder  Authorized by: Gaynelle Cage  Consent: Verbal consent obtained. written consent obtained.  Consent given by: patient  Patient understanding: patient states understanding of the procedure being performed  Patient consent: the patient's understanding of the procedure matches consent given  Procedure consent: procedure consent matches procedure scheduled  Relevant documents: relevant documents present and verified  Test results: test results available and properly labeled  Site marked: the operative site was marked  Imaging studies: imaging studies available  Required items: required blood products, implants, devices, and special equipment available  Patient identity confirmed: verbally with patient and arm band  Time out: Immediately prior to procedure a "time out" was called to verify the correct patient, procedure, equipment, support staff and site/side marked as required.  Procedure purpose: diagnostic and therapeutic  Indications: pleural effusion  Preparation: Patient was prepped and draped in the usual sterile fashion.  Local anesthesia used: yes  Local anesthetic: lidocaine 1% without epinephrine  Anesthetic total: 3 ml  Patient sedated: no  Preparation: skin prepped with Chloraprep  Patient position: supported by bedside stand  Ultrasound guidance: yes  Location: left posterior  Intercostal space: 5th  Puncture method: over-the-needle catheter  Needle gauge: 16  Catheter size: 16 gauge  Number of attempts: 1  Drainage amount: 1000 ml  Drainage characteristics: cloudy  Patient tolerance: Patient tolerated the procedure well with no immediate complications.  Chest x-ray performed: yes (pending)

## 2012-12-13 NOTE — ED Notes (Signed)
Report given to [facility], Charity fundraiser. All questions answered.

## 2012-12-13 NOTE — ED Notes (Signed)
 Ekg performed and handed to the MD. Extra copy placed in the chart.

## 2012-12-13 NOTE — Plan of Care (Signed)
Problem: Discharge Planning  Goal: Participation in care planning  Outcome: Met  Discussed plan of care and treatment. Family at bedside, participates well with the plan.     Problem: Alteration in Blood Glucose  Goal: Glucose level within specified parameters  Outcome: Met  Blood sugar levels monitored ac & HS, pre-meal & correctional doses administered as ordered. Instruct pt to report to this RN for any s/s of hypo/hyperglycemia. See flow sheet for results. Pt was admitted, BS 69 mg/dl. Hypoglycemia protocol   followed.         Problem: Infection  Goal: Absence of infection signs and symptoms  Outcome: Met  WBC 4.6 Pt has been afebrile. Goal ongoing.    Problem: Tissue Perfusion - Cardiopulmonary, Altered  Goal: Hemodynamic stability  Outcome: Met  Vital signs stable. Pt has occ coughing. Coarse breath sounds per auscultation. On O2 protocol. Denies SOB.

## 2012-12-13 NOTE — ED Notes (Addendum)
MD Lemieux at bedside to do thoracentesis

## 2012-12-13 NOTE — ED Notes (Signed)
Repeat CXR after thoracentesis without obvious pneumothorax.     Ludell Zacarias, [place], MD  Resident  12/13/12 [phone]

## 2012-12-13 NOTE — ED Notes (Addendum)
Report received from [facility], care assumed. Pt resting in gurney. Pt w no cough at present time. On 3L o2 via nc sats @ 98%. Family at bedside. vss

## 2012-12-13 NOTE — ED Notes (Addendum)
Assumed care for pt here c/o ongoing cough after admission to hospital 3 weeks ago for pna. Pt c/o general weakness. PT AOx4, nad. Respirations even/nonlabored, skin p/w/d. lugns coarse in all lobes.

## 2012-12-13 NOTE — ED Notes (Signed)
Admitted to medicine, pleural effusion, pneumonia    Ladell Heads, MD  12/13/12 [phone]

## 2012-12-13 NOTE — H&P (Signed)
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 Was recently treated for CAP admitted 4/22-23/14 and dc'd with toc omplete 10 days abx with levaaquin.  Pt denies any fever, chills, abdominal pain, cp. Endorses cough since last admission productive of white sputum and states she has had sob for 1 day and has been unable to sleep prompting presentation to ED.  w/u in ED shows new left pleural effusion compared to cxr from 12/10/12.        Past Medical and Surgical History:  Past Medical History   Diagnosis Date    Migraine headache     Glaucoma     Ascites 06/29/2007     History of paracentesis x 2    Pancreatic cyst 06/29/2007    Cirrhosis 06/24/2007     Active on the liver transplant waiting list with a blood type of O positive. 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    Type II or unspecified type diabetes mellitus with peripheral circulatory disorders, not stated as uncontrolled(250.70) 06/10/2007    HTN 06/10/2007    HTN 06/10/2007     Past Surgical History   Procedure Laterality Date    Pb laps surg cholecstc w/expl common duct  1970s     in [place];  open CCX    Pb cesarean delivery only      Pb cstoplasty/cstourtp plstc any       Bladder suspension       Allergies:  Allergies   Allergen Reactions    Advil (Ibuprofen Micronized) Other     Intolerance           Medications:  See med rec    Social History:  History     Social History    Marital Status: Married     Spouse Name: N/A     Number of Children: N/A    Years of Education: N/A     Social History Main Topics    Smoking status: Former  Smoker -- 10 years     Quit date: 11/13/2012    Smokeless tobacco: Never Used    Alcohol Use: No    Drug Use: Not on file    Sexually Active: Not on file     Other Topics Concern    Not on file     Social History Narrative    No narrative on file       Family History:  Reviewed and noncontributory    Review of Systems:  12 point ROS have been reviewed and are otherwise negative except as stated here or in the HPI      Physical Exam:  BP 157/60  Pulse 64  Temp(Src) 98.3 F (36.8 C)  Resp 15  SpO2 95%  Psych; AAOx3, NAD, good insight/judgement  HENT: NCAT, mmm, poor dentition  Eyes:PERRL, conjunctivae clear  Heart: regular, no murmurs appreciated, no heave  Lungs: Clear to ascultation bilaterally, symmetric rise  Abdomen: Soft, NTTP, +BS  Skin: warm,  dry, no rashes   Lymph: no submadibular, supraclavicular LAD        Labs and Other Data:  Lab Results   Component Value Date    NA 139 12/13/2012    K 3.6 12/13/2012    CL 106 12/13/2012    BICARB 26 12/13/2012    BUN 15 12/13/2012    CREAT 0.86 12/13/2012    GLU 117* 12/13/2012    [place] 8.2* 12/13/2012     Lab Results   Component Value Date    WBC 4.6 12/13/2012    HGB 11.7 12/13/2012    HCT 32.5* 12/13/2012    PLT 44* 12/13/2012    SEG 67 12/13/2012    LYMPHS 19 12/13/2012    MONOS 11 12/13/2012    EOS 3 12/13/2012     No results found for this basename: AST, ALT, ALK, TBILI, DBILI, TP, ALB     Lab Results   Component Value Date    INR 1.5 12/13/2012    PTT 26.0 12/13/2012     Lab Results   Component Value Date    PHUA 6.5 12/13/2012    SGUA 1.007 12/13/2012    GLUCOSEUA Negative 12/13/2012    KETONEUA Negative 12/13/2012    BLOODUA Moderate* 12/13/2012    PROTEINUA Negative 12/13/2012    LEUKESTUA Negative 12/13/2012    NITRITEUA Negative 12/13/2012    WBCUA <1 12/13/2012    RBCUA 6-10 12/13/2012     I have personally reviewed the CXR at the time of admission and it shows new left pleural effusion  Case discussed with ED physician at time of admission      Assessment and Care Plan:  1. Sob 2/2 left pleural  effusion  ? Etiology rcv'd vanc/zosyn in ED.  F/u blood cxs  Doubt infectious process given lack of fever and nl wbc and diff  Will hold further abx.   thoracentesis pending per ED  F/u results    2. Cirrhosis  Ascites-no tappable ascites   Cont lasix, aldactone  HE-mental status clear  Cont rifaxamin  EV-sm distal EV 5/13  Cont propranolol    3. DM  Cont lantus, SSI    4. DVT  Hold chemoppx given thrombocytopenia    This plan and alternatives have been discussed with the patient and/or surrogate.    Code Status:  Full Code    The patient's primary care physician or clinic has not been contacted regarding this admission.    Note Author: Leslie Andrea, 12/13/2012, 6:21 AM      .

## 2012-12-13 NOTE — ED Notes (Signed)
72 year old discharged with CAP on levoquin and continued symptomatic. Plan admit to med/surg. Vanc/zosyn. Pleural effusion tap.     Draylen Lobue, [place], MD  Resident  12/13/12 [phone]

## 2012-12-13 NOTE — Plan of Care (Signed)
Problem: Discharge Planning  Goal: Participation in care planning  Outcome: Not Met  No d/c plan at this time.    Problem: Alteration in Blood Glucose  Goal: Glucose level within specified parameters  Outcome: Not Met  Gave 3 units humalog as per sliding scale and scheduled hs lispro 10 units.    Problem: Infection  Goal: Absence of infection signs and symptoms  Outcome: Met  Pt afebrile, no s/s of infection. Thoracentesis (right midposterior chest) site with band aid clean and dry.

## 2012-12-13 NOTE — Interdisciplinary (Signed)
Received the pt on bed awake and with family around, pt is awaiting to transfer to[facility].Room is ready and transferred to [address] by wheel chair.

## 2012-12-13 NOTE — ED Notes (Signed)
Lab orders routed to lab

## 2012-12-13 NOTE — ED Floor Report (Signed)
Paula Manning is a 72 year old female    Chief Complaint   Patient presents with    Cough     productive cough x 2 days       Admitted for There are no admission diagnoses documented for this encounter.    Code Status:  Please refer to In-pt admitting doctors orders     Past Medical History   Diagnosis Date    Migraine headache     Glaucoma     Ascites 06/29/2007     History of paracentesis x 2    Pancreatic cyst 06/29/2007    Cirrhosis 06/24/2007     Active on the liver transplant waiting list with a blood type of O positive. 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    Type II or unspecified type diabetes mellitus with peripheral circulatory disorders, not stated as uncontrolled(250.70) 06/10/2007    HTN 06/10/2007    HTN 06/10/2007       Past Surgical History   Procedure Laterality Date    Pb laps surg cholecstc w/expl common duct  1970s     in [place];  open CCX    Pb cesarean delivery only      Pb cstoplasty/cstourtp plstc any       Bladder suspension       Allergies: Advil    Level of Care : tele    Fall Risk: yes    Skin issues:  no    >> If yes, note areas of skin breakdown. See appropriate photos.      Ambulatory:  yes    Sitter needed: no    Suicide Risk :  no    Isolation Required: no     >> If yes , what type of isolation:    Is patient in custody?  no    Is patient in restraints? no    Is patient on Heparin? no If yes, complete below:   Bolus=    Units      Units/hr   @   Mls/hour      Time of next PT/PTT draw:          Brief Summary of ED Visit (to include focused assessment and neuro status):    Pt presents with persistent cough, recent admit for community acquired PNA, xray showing L pleural effusion/coarse breath sounds to all lobes. PT given ABX in ed, awaiting CT scan results.    See  RN SHIFT  ASSESSMENT ADULT  for full assessment, exceptions will be listed.    Cardiac rhythm:  nsr    Oxygen Delivery: Nasal Cannula 2 L/Min    Filed Vitals:    12/12/12 2329 12/13/12 0104 12/13/12 0355 12/13/12 0432   BP: 161/60 119/69 160/49 156/60   Pulse: 68 67 62 64   Temp: 97.9 F (36.6 C) 98.3 F (36.8 C)     Resp: 20 18 15 16    SpO2: 94% 94% 95% 97%       Lab Results   Component Value Date    WBC 4.6 12/13/2012    RBC 3.56* 12/13/2012    HGB 11.7 12/13/2012    HCT 32.5* 12/13/2012    MCV 91.3 12/13/2012    MCHC 36.0 12/13/2012  RDW 15.1* 12/13/2012    PLT 44* 12/13/2012    MPV 10.2 12/13/2012       Lab Results   Component Value Date    NA 139 12/13/2012    K 3.6 12/13/2012    CL 106 12/13/2012    BICARB 26 12/13/2012    BUN 15 12/13/2012    CREAT 0.86 12/13/2012    GLU 117* 12/13/2012    [place] 8.2* 12/13/2012       Lab Results   Component Value Date    PHOS 2.6* 12/13/2012    MG 1.8 12/13/2012       No results found for this basename: CPK, CKMBH, TROPONIN       No results found for this basename: PH, PCO2, O2CONTENT, IVHC3, IVBE, O2SAT, UNPH, UNPCO2, ARTPH, ARTPCO2, ARTO2CNT, IAHC3, IABE, ARTO2SAT, UNAPH, UNAPCO2       No results found for this visit on 12/13/12.    RADIOLOGIC STUDIES NOT COMPLETED: ct results  (none unless otherwise noted)    Active PICC Line / CVC Line / PIV Line / Drain / Airway / Intraosseous Line / Epidural Line / ART Line / Line Type / Wound    Name: Placement date: Placement time: Site: Days:    Peripheral IV - 20 G Right Antecubital 12/13/12  0329  Antecubital  less than 1            Any significant events and interventions with responses:  na      Floor nurse informed that report is ready for review.  Opportunity to answer questions with floor RN face to face or by phone. ER number is [id number] . ER RN Hewitt Shorts to be contacted for any questions.    Has this report been updated?  no  By Who:   Time:   Additional information by updated user:

## 2012-12-13 NOTE — ED Notes (Signed)
Pt in xray

## 2012-12-13 NOTE — ED Notes (Signed)
Admitting MD at bedside assessing pt

## 2012-12-13 NOTE — Interdisciplinary (Signed)
Ordered tot transfer patient to [facility] . Hand off report given to [employer].

## 2012-12-13 NOTE — ED Notes (Signed)
Assumed care  72 yo F with recent CAP  Persistent cough and SOB  CXR with L pleural effusion  Non-cont CT chest  abx  admit    Ethelmae Ringel, Etter Sjogren, MD  12/13/12 [phone]

## 2012-12-13 NOTE — ED Provider Notes (Signed)
History  Chief Complaint   Patient presents with    Cough     productive cough x 2 days     HPI    72 y/o female history of hypertension, diabetes, cirrhosis, recent admission for CAP returns with persistent cough productive of white sputum, DOE, orthopnea, and subjective chills. States that since discharge her symptoms have not improved despite completing a course of Levaquin. She endorses nausea without vomiting, denies diarrhea or abdominal pain. Denies chest pain, urinary symptoms.        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    Type II or unspecified type diabetes mellitus with peripheral circulatory disorders, not stated as uncontrolled(250.70) 06/10/2007    HTN 06/10/2007    HTN 06/10/2007       Past Surgical History   Procedure Laterality Date    Pb laps surg cholecstc w/expl common duct  1970s     in [place];  open CCX    Pb cesarean delivery only      Pb cstoplasty/cstourtp plstc any       Bladder suspension       No family history on file.    History   Substance Use Topics    Smoking status: Former Smoker -- 10 years    Smokeless tobacco: Never Used    Alcohol Use: No       Review of Systems    A thorough 14 point review of systems was conducted and negative except as noted above in history of present illness    Physical Exam  BP 119/69  Pulse 67  Temp(Src) 98.3 F (36.8 C)  Resp 18  SpO2 94%    Physical Exam    Vital signs reviewed and noted.  GEN: a&o in nad, behaving appropriately, non-toxic appearing.  HEAD: nc/at. perrla, eomi.  mmm.  NECK: Supple, no LAD,  No JVD.  LUNGS: CTAB, Diminished left base, good air movement  HEART:  RRR, nl s1/s2, no m/r/g.  2+ pulses radial and DP  ABD: Soft, ND, NT, +BS, no masses palpated.  BACK: No midline TTP, no CVA tenderness  EXT: No pedal edema  SKIN: Warm, dry, no rashes  NEURO: Mentation appropriate, CN II-XII intact. 5/5 strength B/L U&LE.  Sensation globally intact and symmetric.    Labs Reviewed   CBC WITH ADIFF, BLOOD   BASIC METABOLIC PANEL, BLOOD   MAGNESIUM, BLOOD   PHOSPHORUS, BLOOD   URINALYSIS   BLOOD CULTURE ROUTINE   BLOOD CULTURE ROUTINE         ED Course/Medical Decision Making Narrative    Presentation concerning for unresolved/hospital-acquired pneumonia. Chest x-ray demonstrates left-sided pleural effusion, presumably parapneumonic. Plan for noncontrast chest CT to better characterize and admission to medicine service with antibiotic coverage for hospital-acquired pneumonia.          Critical Care Time  Additional Notes      Home Medication List  Prior to Admission Medications   Outpatient Medications Last Dose Informant Patient Reported? Taking?   CALCIUM CARBONATE 600 MG OR TABS   No No   Sig: 1 tablet 2 times daily   HYDROcodone-acetaminophen (VICODIN) 5-500 MG per tablet   No No   Sig: Take 1 tablet by mouth every 6 hours as needed for Moderate Pain (Pain Score 4-6) or Severe Pain (Pain Score 7-10).   VITAMIN D 400 UNIT OR CAPS   No No   Sig: 1 CAPSULE DAILY   bacitracin 500 UNIT/GM ointment   No No   Sig: Apply small amount to affected area on face twice daily   benzonatate (TESSALON) 100 MG capsule   No No   Sig: Take 1 capsule by mouth 3 times daily as needed for Cough.   furosemide (LASIX) 20 MG tablet   No No   Sig: Take 1 tablet by mouth daily.   glipiZIDE (GLUCOTROL) 10 MG tablet   No No   Sig: Take 1 tablet by mouth 2 times daily.   guaiFENesin-codeine (ROBITUSSIN AC) 100-10 MG/5ML oral solution   No No   Sig: Take 5 mLs by mouth every 6 hours as needed for Cough.    insulin glargine (LANTUS) 100 UNIT/ML injection   No No   Sig: Inject 10 Units under the skin nightly.   multivitamin (MULTI-VITAMIN) tablet   Yes No   Sig: Take 1 tablet by mouth daily.   ondansetron (ZOFRAN) 4 MG tablet   No No   Sig: Take 1 tablet by mouth every 8 hours as needed for Nausea/Vomiting.   propranolol (INDERAL) 10 MG tablet   No No   Sig: Take 1 tablet by mouth 3 times daily.   rifaximin (XIFAXAN) 550 MG tablet   Yes No   Sig: Take 550 mg by mouth every 12 hours.   spironolactone (ALDACTONE) 25 MG tablet   No No   Sig: Take 2 tablets by mouth daily.   traMADol (ULTRAM) 50 MG tablet   No No   Sig: Take 1 tablet by mouth every 6 hours as needed for Moderate Pain (Pain Score 4-6).      Facility-Administered Medications: None       Danie Binder, MD  Resident  12/13/12 [phone]

## 2012-12-13 NOTE — Plan of Care (Signed)
Problem: Discharge Planning  Goal: Participation in care planning  Received pt from [facility] via gurney at 2004 accompanied by rn.

## 2012-12-13 NOTE — ED Attending Note (Signed)
Seen and d/w dr lemieux  agree w/ap as discussed    CC: cough    72 year old female presenting with cough. Pt was admitted from 4/22-4/23 for CAP, treated with ctx and azithro, then discharged with moxi. Pt reports persistent productive cough, orthopnea since then. No fevers or chills.     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    Type II or unspecified type diabetes mellitus with peripheral circulatory disorders, not stated as uncontrolled(250.70) 06/10/2007    HTN 06/10/2007    HTN 06/10/2007     Past Surgical History   Procedure Laterality Date    Pb laps surg cholecstc w/expl common duct  1970s     in [place];  open CCX    Pb cesarean delivery only      Pb cstoplasty/cstourtp plstc any       Bladder suspension     No current facility-administered medications for this encounter.     Current Outpatient Prescriptions   Medication Sig    bacitracin 500 UNIT/GM ointment Apply small amount to affected area on face twice daily    benzonatate (TESSALON) 100 MG capsule Take 1 capsule by mouth 3 times daily as needed for Cough.    CALCIUM CARBONATE 600 MG OR TABS 1 tablet 2 times daily    furosemide (LASIX) 20 MG tablet Take 1 tablet by mouth daily.    glipiZIDE (GLUCOTROL) 10 MG tablet Take 1 tablet by mouth 2 times daily.    guaiFENesin-codeine (ROBITUSSIN AC) 100-10 MG/5ML oral solution Take 5 mLs by mouth every 6 hours as needed for Cough.    HYDROcodone-acetaminophen (VICODIN) 5-500 MG per tablet Take 1 tablet by mouth every 6 hours as needed for Moderate  Pain (Pain Score 4-6) or Severe Pain (Pain Score 7-10).    insulin glargine (LANTUS) 100 UNIT/ML injection Inject 10 Units under the skin nightly.    multivitamin (MULTI-VITAMIN) tablet Take 1 tablet by mouth daily.    ondansetron (ZOFRAN) 4 MG tablet Take 1 tablet by mouth every 8 hours as needed for Nausea/Vomiting.    propranolol (INDERAL) 10 MG tablet Take 1 tablet by mouth 3 times daily.    rifaximin (XIFAXAN) 550 MG tablet Take 550 mg by mouth every 12 hours.    spironolactone (ALDACTONE) 25 MG tablet Take 2 tablets by mouth daily.    traMADol (ULTRAM) 50 MG tablet Take 1 tablet by mouth every 6 hours as needed for Moderate Pain (Pain Score 4-6).    VITAMIN D 400 UNIT OR CAPS 1 CAPSULE DAILY     History   Substance Use Topics    Smoking status: Former Smoker -- 10 years    Smokeless tobacco: Never Used    Alcohol Use: No     No family history on file.  ROS- aside from above reviewed items, all systems reviewed and are negative.    Exam  BP 119/69  Pulse 67  Temp(Src) 98.3 F (36.8 C)  Resp 18  SpO2 94%  Vital signs interpretation: nl vitals  Head: nontoxic appearing female in nad, eomi, o/p moist  Neck: not stiff  CV: rrr-m  Lungs: diminished L base  ABD: soft, mild ttp in ruq, no guarding or rebound  Back: no cva ttp  Ext: no edema or asymmetry  Neuro: awake, alert, oriented x 3, mae with nl tone and strength    Labs/Studies:     Labs Reviewed   CBC WITH ADIFF, BLOOD   BASIC METABOLIC PANEL, BLOOD   MAGNESIUM, BLOOD   PHOSPHORUS, BLOOD       ECG with nsr, rate 62, no e/o acute ischemia, intervals nl    Impression:  72 yo female presenting with persistent cough, orthopnea since her admission recently for CAP.  Plan for repeat cxr, labs, reassess.     Agree w/a/p as d/w resident    cxr with new L effusion  Labs  Antibiotics for HCAP  CT non/con  Admit to medicine

## 2012-12-13 NOTE — Consults (Signed)
Hepatology Consultation:    Fellow: Lorelei Pont, MD  Consulting Physician: Leone Payor, MD  Requesting Physician: Gaynelle Cage, MD    Reason For Consultation:  Shortness of breath, HepC Cirrhosis  Chief Complaint: Cough, shortness of breath.  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    She was feeling well until two weeks ago when she noticed dyspnea and cough. She went to the ER on 4/22 and was diagnosed with community acquired pneumonia and was treated with Azithro and Ceftriaxone. Her dyspnea improved on outpatinet treatment with moxifloxacin, but the cough was persistent. Her PMD prescribed prednosine for possible mild COPD which caused hyperglycemia and triggered an ER visit on 5/2, of note a CXR on 5/2 showed a small RLL effusion.   Then after discharge from teh ER she began to feel worsening dyspnea and cough, which was the reason for the ER visit today.    Denies fevers, chills, nausea, vomiting, diarrhea, abdominal distention, confusion.    In the ER she had a thoracentesis with removal of 1L. After which her dyspnea was much improved.    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  Blood type of O positive.  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    Type II or unspecified type diabetes  mellitus with peripheral circulatory disorders, not stated as uncontrolled(250.70) 06/10/2007    HTN 06/10/2007    HTN 06/10/2007       Past Surgical History:  Past Surgical History   Procedure Laterality Date    Pb laps surg cholecstc w/expl common duct  1970s     in [place];  open CCX    Pb cesarean delivery only      Pb cstoplasty/cstourtp plstc any       Bladder suspension       Social History:  History     Social History    Marital Status: Married     Spouse Name: N/A     Number of Children: N/A    Years of Education: N/A     Social History Main Topics    Smoking status: Former Smoker -- 10 years     Quit date: 11/13/2012    Smokeless tobacco: Never Used    Alcohol Use: No    Drug Use: Not on file    Sexually Active: Not on file     Other Topics Concern    Not on file     Social History Narrative    No narrative on file         Family History:  No family history on file.    Allergy:  Allergies   Allergen Reactions    Advil (Ibuprofen Micronized) Other     Intolerance           Medications:  Prescriptions prior to admission   Medication Sig Dispense Refill    bacitracin 500 UNIT/GM ointment Apply small amount to affected area on face twice daily  1 Tube  1    benzonatate (TESSALON) 100 MG capsule Take 1 capsule by mouth 3 times daily as needed for Cough.  60 capsule  0    CALCIUM CARBONATE 600 MG OR TABS 1 tablet 2 times daily  one month  11    furosemide (LASIX) 20 MG tablet Take 1 tablet by mouth daily.  60 tablet  0    glipiZIDE (GLUCOTROL) 10 MG tablet Take 1 tablet by mouth 2 times daily.  60 tablet  3    guaiFENesin-codeine (ROBITUSSIN AC) 100-10 MG/5ML oral solution Take 5 mLs by mouth every 6 hours as needed for Cough.  120 mL  0    HYDROcodone-acetaminophen (VICODIN) 5-500 MG per tablet Take 1 tablet by mouth every 6 hours as needed for Moderate Pain (Pain Score 4-6) or Severe Pain (Pain Score 7-10).  30 tablet  0    insulin glargine (LANTUS) 100 UNIT/ML injection Inject 10 Units under the skin nightly.  1 Vial  2    multivitamin (MULTI-VITAMIN) tablet Take 1 tablet by mouth daily.        ondansetron (ZOFRAN) 4 MG tablet Take 1 tablet by mouth every 8 hours as needed for Nausea/Vomiting.  15 tablet  0    propranolol (INDERAL) 10 MG tablet Take 1 tablet by mouth 3 times daily.  60 tablet  0    rifaximin (XIFAXAN) 550 MG tablet Take 550 mg by mouth every 12 hours.        spironolactone (ALDACTONE) 25 MG tablet Take 2 tablets by mouth daily.  60 tablet  0    traMADol (ULTRAM) 50 MG tablet Take 1 tablet by mouth every 6 hours as needed for Moderate Pain (Pain Score 4-6).  30 tablet  0    VITAMIN D 400 UNIT OR CAPS 1 CAPSULE DAILY  30  11       Review of Systems:  12 point ROS performed with patient, with pertinent positives and negatives as per HPI.    Physician examination:   BP 136/56  Pulse 50  Temp(Src)  98.2 F (36.8 C)  Resp 18  Ht 5\' 1"  (1.549 m)  Wt 53.978 kg (119 lb)  BMI 22.5 kg/m2  SpO2 98% Body mass index is 22.5 kg/(m^2).  Wt Readings from Last 2 Encounters:   12/13/12 53.978 kg (119 lb)   12/10/12 53.978 kg (119 lb)      Blood Pressure   12/13/12 136/56   12/11/12 136/76     Temperature:  [97.9 F (36.6 C)-98.3 F (36.8 C)] 98.2 F (36.8 C) (05/05 1554)  Blood pressure (BP): (119-161)/(49-69) 136/56 mmHg (05/05 1554)  Heart Rate:  [50-82] 50 (05/05 1554)  Respirations:  [15-20] 18 (05/05 1554)  Pain Score:  [-] 0 (05/05 1554)  O2 Device:  [-] Nasal cannula (05/05 0944)  O2 Flow Rate (L/min):  [2 l/min-3 l/min] 2 l/min (05/05 0944)  SpO2:  [94 %-98 %] 98 % (05/05 1554)    General: NAD, comfortable  Eyes: No icterus, PERRLA  ENT: Intact hearing, Oropharynx clear  Cardiovascular: RRR, no edema  Pulm: minimally decreased breath sounds in the left base, good effort  Gastrointestinal: +BS, soft, NTND  Integumentary: no rashes, warm  Neuro: A&Ox 3, no asterixis  Lymph: no cervical/supraclavicular  Rectal: not examined  MSK: good strength    Labs:  CBC  Lab Results   Component Value Date    WBC 4.6 12/13/2012    HGB 11.7 12/13/2012    HCT 32.5 12/13/2012    MCV 91.3 12/13/2012    PLT 44 12/13/2012    PLT 47 04/23/2009     Chemistry  Lab Results   Component Value Date    NA 139 12/13/2012    K 3.6 12/13/2012    CL 106 12/13/2012    BICARB 26 12/13/2012    BUN 15 12/13/2012    CREAT 0.86 12/13/2012    GLU 117 12/13/2012     Liver  Lab Results   Component Value Date    TBILI 0.6 12/10/2012    DBILI 0.4 08/31/2012    AST 28 12/10/2012    ALT 27 12/10/2012    ALK 120 12/10/2012    GGT 12 10/16/2004    LDH 351 12/27/2010    ALB 3.0 12/10/2012     Coags  Lab Results   Component Value Date    INR 1.5 12/13/2012    PTT 26.0 12/13/2012     Pleural fluid:  WBC 198  LDH 48  Total Protein 1.2    Images:  CXR 12/13/12:  1. Interval decreased size of left pleural effusion, now moderate. No pneumothorax.  2. Small right pleural effusion layering along the major  fissure.    MRI ABD 07/2012:  1. Observation assessment:  LIRADS NEGATIVE - no reportable LIRADS observations  2. Radiologic recommendation: Routine surveillance  3. Cirrhosis with stigmata of portal hypertension.  4. Persistent partially occlusive thrombus at the portal vein confluence with extension into the cephalad aspect of the superior mesenteric vein.   5. Severe iron deposition.  6. Multiple pancreatic head/neck cyst not significantly changed compared to the prior examination.      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    # Dyspnea with pleural effusion: Transudate, likely para-pneumonic effusion  - Continue to monitor overnight  - Repeat thoracentesis if re-accumulating or symptomatic  - Completed anti-biotic course, no role for further antibiotic therapy    # HCV Cirrhosis; with SVR after treatment  *Ascites: minimal on exam  -continue Lasix 20, aldactone 50 (consider increasing if effusion re-accumulates)  *HE: none, continue Rifaximin  *EV: small on EGD 12/2011, repeat in 2015  *HCC: needs surveillance MRI  -Would order MRI, Quad phase to assess for [facility] and PVT/SMV thrombus progression    Plan discussed with attending physician.    Electronically Signed by:  Lorelei Pont  Gastroenterology/Hepatology Fellow      ATTENDING INITIAL CONSULT NOTE ATTESTATION    Subjective    Reason for consultation:  SOB    History of present illness:  This is a 72 year old woman with HCV GT2 cirrhosis (achieved SVR) who presents with SOB. She was recently admitted for pneumonia and was treated with a course of azithromcin and ceftriaxone. She was discharged home with moxifloxacin. Now presents with SOB with cxr showing left pleural effusion. Thoracentesis was performed with improvement of her dyspnea.     See fellow  history and physical for further details of the patient's history.    Objective    I have examined the patient and concur with the fellow exam.    Assessment and Plan    I agree with the fellow care plan.    See the fellow history and physical for further details.    IMPRESSIONS:  1) SOB. Likely related to her left sided pleural effusion. Pleural fluid consistent with a parapneumoinc effusion. No evidence of infected fluid.  2) Fluid retention. Continue lasix and aldactone. Low sodium diet.  3) hepatic encephalopathy. Controlled with rifaximin.   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 Recommend repeating quad phase abd MRI now to assess if the portal vein thrombosis is progressing and screen for [facility].

## 2012-12-13 NOTE — ED Follow-up Note (Signed)
Follow-up type: Callback       Routine ED Patient Call Back    Patient unable to be contacted, no message left

## 2012-12-13 NOTE — ED Notes (Signed)
Assisting primary rn  - MD at bedside to eval pt

## 2012-12-13 NOTE — ED Notes (Signed)
Cbc drawn and sent  Pt taken to ct via gurney with ed tech

## 2012-12-14 DIAGNOSIS — R0609 Other forms of dyspnea: Secondary | ICD-10-CM

## 2012-12-14 DIAGNOSIS — J438 Other emphysema: Secondary | ICD-10-CM

## 2012-12-14 LAB — ECG 12-LEAD
QRS INTERVAL/DURATION: 82 ms
VENTRICULAR RATE: 62 {beats}/min

## 2012-12-14 LAB — GLUCOSE (POCT)
Glucose (POCT): 91 mg/dL (ref 70–115)
Glucose (POCT): 222 mg/dL — ABNORMAL HIGH (ref 70–115)

## 2012-12-14 LAB — ALBUMIN, OTHER: Albumin, Other: 0.5 g/dL

## 2012-12-14 LAB — PROTHROMBIN TIME, BLOOD
INR: 1.7
PT,Patient: 17.5 s — ABNORMAL HIGH (ref 9.7–12.5)

## 2012-12-14 MED ORDER — SPIRONOLACTONE 100 MG OR TABS
100.0000 mg | ORAL_TABLET | Freq: Every day | ORAL | Status: DC
Start: 2012-12-15 — End: 2012-12-14

## 2012-12-14 MED ORDER — SPIRONOLACTONE 25 MG OR TABS
100.0000 mg | ORAL_TABLET | Freq: Every day | ORAL | Status: DC
Start: 2012-12-14 — End: 2013-06-07

## 2012-12-14 MED ORDER — FUROSEMIDE 40 MG OR TABS
40.0000 mg | ORAL_TABLET | Freq: Every day | ORAL | Status: DC
Start: 2012-12-15 — End: 2012-12-14

## 2012-12-14 MED ORDER — FUROSEMIDE 20 MG OR TABS
40.0000 mg | ORAL_TABLET | Freq: Every day | ORAL | Status: DC
Start: 2012-12-14 — End: 2013-06-07

## 2012-12-14 MED ORDER — PNEUMOCOCCAL VAC POLYVALENT 25 MCG/0.5ML IJ INJ (CUSTOM)
0.5000 mL | INJECTION | INTRAMUSCULAR | Status: DC
Start: 2012-12-14 — End: 2012-12-14

## 2012-12-14 NOTE — Discharge Instructions (Signed)
Diagnosis and Reason for Admission    You were admitted to the hospital for the following reason(s):  SOB    Your full diagnosis list is located on this After Visit Summary in the Hospital Problems section.    What Happened During Your Hospital Stay    The main tests and treatments done for you during this hospitalization were:    Thoracentesis and MRI    The following evaluation is still important to complete after discharge from the hospital:  Follow up MRI results    Instructions for After Discharge    Your diet at home should be a low-salt diet.    Your activity level at home should be:  as much exercise or activity as you can tolerate.    Specific activity restrictions:    Do not drive while taking narcotic pain medications.    Wound or tube care instructions:  Keep area clean and dry.    Your medication list is located on this After Visit Summary in the Current Discharge Medication List section.  Your nurse will review this information with you before you leave the hospital.    It is very important for you to keep a current medication list with you in order to assist your doctors with your medical care.  Bring this After Visit Summary with you to your follow up appointments.         Reasons to Contact a Doctor Urgently    Call 911 or return to the hospital immediately if:  Confusion, bleeding, worsening shortness of breath    You should contact either your primary care physician or your hospital physician for any of the following reasons:  Confusion, bleeding, worsening shortness of breath      Your hospital physician at the time of hospital discharge is [facility] Alisi Lupien.    Your physicians strive to explain things in a way you can understand.  If you have any questions or concerns about your hospital care or your medications, your hospital physician can be contacted in the following manner:  [facility] phone triage at [phone].    New problems and symptoms unrelated to your hospitalization are  best addressed by your primary outpatient physician (PCP), as are requests for medication refills or appointment referrals after hospital discharge.  However, if new issues arise before you can see or contact your PCP, your hospital physician may be able to assist with these issues during this time.    What Needs to Happen Next After Discharge -- Appointments and Follow Up    Any appointments already scheduled at [facility] clinics will be listed in the Future Appointments section at the top of this After Visit Summary.      Sometimes tests performed in the hospital do not yet have results by the time a patient goes home.  The following key tests will need to be followed up at your next appointment: Culture of blood and MRI    Medical Home Information    Your primary care provider or clinic currently on file at House is: [facility].      Additional Discharge Information (if applicable)            Handouts Given to You (if applicable)

## 2012-12-14 NOTE — Interdisciplinary (Signed)
Pt resting this morning. Alert and oriented x 4. Spanish speaking mostly. Family at bedside. Denies SOB. VSS. O2 sats stable on room air. Is currently at MRI of abdomen.

## 2012-12-14 NOTE — Progress Notes (Signed)
Hepatology Progress Note  GI Fellow/Author of Note: Lorelei Pont, MD  Attending: Leone Payor  Current Hospital Stay:   1 day - Admitted on: 12/13/2012      Reason for consultation: shortness of breath    Overnight Events/Subjective:   Feeling better this morning with improved cough. Eating well.    Current Facility-Administered Medications   Medication    benzonatate (TESSALON) capsule 100 mg    dextrose 50 % solution 12.5 g    furosemide (LASIX) tablet 20 mg    glucagon (GLUCAGON) injection 1 mg    glucose 40% oral gel 1 Tube    glucose chewable tablet 16 g    insulin glargine (LANTUS) injection 10 Units    insulin lispro (HUMALOG) injection 1-10 Units    ondansetron (ZOFRAN) injection 4 mg    propranolol (INDERAL) tablet 10 mg    rifaximin (XIFAXAN) tablet 550 mg    sodium chloride (PF) 0.9 % flush 3 mL    sodium chloride (PF) 0.9 % flush 3 mL    sodium chloride 0.9% infusion    spironolactone (ALDACTONE) tablet 50 mg    traMADol (ULTRAM) tablet 50 mg       Allergies   Allergen Reactions    Advil (Ibuprofen Micronized) Other     Intolerance           ROS:   Denies chest pain, palpitations    Objective:  Vital Signs:  Temperature:  [97.9 F (36.6 C)-99.1 F (37.3 C)] 98.8 F (37.1 C) (05/06 0359)  Blood pressure (BP): (118-153)/(52-62) 118/53 mmHg (05/06 0359)  Heart Rate:  [50-82] 69 (05/06 0359)  Respirations:  [16-18] 18 (05/06 0359)  Pain Score:  [-] 0 (05/06 0359)  O2 Device:  [-] None (Room air) (05/05 1951)  O2 Flow Rate (L/min):  [2 l/min-3 l/min] 2 l/min (05/05 0944)  SpO2:  [96 %-98 %] 96 % (05/06 0359)  Wt Readings from Last 1 Encounters:   12/13/12 53.978 kg (119 lb)     Intake/Output (Current Shift):     General: NAD, comfortable  Eyes: No icterus, PERRLA  ENT: Intact hearing, Oropharynx clear  Cardiovascular: RRR, no edema  Pulm: dullness to percussion in left lung base, good effort  Gastrointestinal: +BS, soft, NTND   Integumentary: no rashes, warm  Neuro: A&Ox 3, no  asterixis  Lymph: no cervical/supraclavicular  Rectal: not examined  MSK: good strength    Labs:  CBC  Lab Results   Component Value Date    WBC 4.6 12/13/2012    HGB 11.7 12/13/2012    HCT 32.5 12/13/2012    MCV 91.3 12/13/2012    PLT 44 12/13/2012    PLT 47 04/23/2009     Chemistry  Lab Results   Component Value Date    NA 139 12/13/2012    K 3.6 12/13/2012    CL 106 12/13/2012    BICARB 26 12/13/2012    BUN 15 12/13/2012    CREAT 0.86 12/13/2012    GLU 117 12/13/2012     Liver  Lab Results   Component Value Date    TBILI 0.6 12/10/2012    DBILI 0.4 08/31/2012    AST 28 12/10/2012    ALT 27 12/10/2012    ALK 120 12/10/2012    GGT 12 10/16/2004    LDH 351 12/27/2010    ALB 3.0 12/10/2012     Coags  Lab Results   Component Value Date    INR 1.5 12/13/2012  PTT 26.0 12/13/2012       Pleural fluid:   WBC 198   LDH 48   Total Protein 1.2    Images:   CXR 12/13/12:   1. Interval decreased size of left pleural effusion, now moderate. No pneumothorax.  2. Small right pleural effusion layering along the major fissure.    MRI ABD 07/2012:   1. Observation assessment:  LIRADS NEGATIVE - no reportable LIRADS observations  2. Radiologic recommendation: Routine surveillance  3. Cirrhosis with stigmata of portal hypertension.  4. Persistent partially occlusive thrombus at the portal vein confluence with extension into the cephalad aspect of the superior mesenteric vein.   5. Severe iron deposition.  6. Multiple pancreatic head/neck cyst not significantly changed compared to the prior examination.    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     # Dyspnea with pleural effusion: Transudate, likely para-pneumonic effusion   - symptoms improved  - Completed anti-biotic course, no role for further antibiotic therapy     # HCV Cirrhosis; with SVR after treatment   *Ascites:  minimal on exam   -continue Lasix 20, aldactone 50   *HE: none, continue Rifaximin   *EV: small on EGD 12/2011, repeat in 2015   *HCC: needs surveillance MRI   -await MRI, Quad phase to assess for [facility] and PVT/SMV thrombus progression     If MRI completed and feeling well while ambulating on room air can be discharged today    Plan discussed with attending physician.     Electronically Signed by:   Lorelei Pont   Gastroenterology/Hepatology Fellow

## 2012-12-14 NOTE — Discharge Summary (Signed)
Date of Admission:  12/13/2012  Date of Discharge:  12/14/2012    Patient Name:  Paula Manning    Principal Diagnosis (required):  Acute dyspnea    Hospital Problem List (required):  Active Hospital Problems    Diagnosis    *Acute dyspnea [786.09]    Pleural effusion associated with hepatic disorder [511.89, 573.9]    Osteoporosis [733.00]    Cirrhosis of liver [571.5]    Emphysema [492.8]    DM circ dis type II [250.70, 443.81]    Anemia [285.9]    EV (esophageal varices) [456.1]    Viral hepatitis C [070.70]      Resolved Hospital Problems    Diagnosis   No resolved problems to display.       Additional Hospital Diagnoses ("rule out" or "suspected" diagnoses, etc.):  None    Principal Procedure During This Hospitalization (required):  CT imaging of chest    Other Procedures Performed During This Hospitalization (required):    Thoracentesis    Procedure results are available in Chart Review in Epic.  For those providers external to [facility], the key procedure results are listed below:  HEP HEPATOLOGY Latest Ref Rng 12/14/2012 12/13/2012 12/10/2012   WBC 4.0 - 10.0 1000/mm3 5.6 4.6 6.2   SEGS 45 - 70 %      BAND 0 - 15 %      ANC 1.6 - 7.0 1000/mm3      HGB 11.2 - 15.7 gm/dL 16.1 09.6 04.5 (L)   HCT 34.0 - 45.0 % 35.9 32.5 (L) 28.1 (L)   PLT 140 - 370 1000/mm3 54 (L) 44 (L) 32 (L)   INR  1.7 1.5    NA 136 - 145 mmol/L 138 139 121 (L)   K 3.5 - 5.1 mmol/L 4.2 3.6 4.3   CL 98 - 107 mmol/L 103 106 92 (L)   BI 22 - 29 mmol/L 30 (H) 26 21 (L)   BUN 6 - 20 mg/dL 12 15 24  (H)   CR 0.51 - 0.95 mg/dL 4.09 8.11 9.14   GLUC 70 - 115 mg/dL 782 956 (H) 213 (H)   AST 0 - 32 U/L 40 (H)  28   ALT 0 - 33 U/L 44 (H)  27   TBILI <1.2 mg/dL 1.6 (H)  0.6   DBILI <0.8 mg/dL      ALK PHOS 35 - 657 U/L 131  120   GGT <30 IU/L      ALB 3.5 - 5.2 g/dL 3.1 (L)  3.0 (L)   AFP <8 ng/mL      MAG 1.7 - 2.6 mg/dL  1.8    PHOS 2.7 - 4.5 mg/dL  2.6 (L)    LAC 4.5 - 84.6 mg/dL      LDH 962 - 952 U/L      CAL 8.8 - 10.2 mg/dL 8.5 (L) 8.2 (L) 8.2 (L)       CT chest without contrast:     1. Large left pleural effusion.    2. Scattered ground glass opacities with right upper and lower lobes.   Attention on routine followup examination.    3. Calcified right upper lobe granuloma.    4. Redemonstration of cirrhosis, ascites, splenomegaly.    5. Anterior upper abdominal wall fluid collection, 1.4 x 6.5 cm, new compared  to CT abdomen 10/15/2011. 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    Consultations Obtained During This Hospitalization:  Hepatology  Interventional Radiology  Key consultant recommendations:    Hepatology    1) SOB. Likely related to her left sided pleural effusion. Pleural fluid consistent with a parapneumoinc effusion. No evidence of infected fluid.   2) Fluid retention. Continue lasix and aldactone. Low sodium diet.   3) hepatic encephalopathy. Controlled with rifaximin.   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 Recommend repeating quad phase abd MRI now to assess if the portal vein thrombosis is progressing and screen for [facility].    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 Was recently treated for CAP admitted 4/22-23/14 and dc'd with toc omplete 10 days abx with levaaquin. Pt denies any fever, chills, abdominal pain, cp. Endorses cough since last admission productive of white sputum and states she has had sob for 1 day and has been unable to sleep prompting presentation to ED. w/u in ED shows new left pleural effusion compared to cxr from 12/10/12.     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 lasix, aldactone as above   - HE: cont rifaxamin   - EV: sm distal EV 5/13   - HCC surveillance. UTD    # DM2   - cont lantus and glipizide    Tests Outstanding at Discharge Requiring Follow Up:  None    Discharge Condition (required):  Improved.    Key Physical Exam Findings at Discharge:  No significant physical examination findings at the time of discharge.    Discharge Diet:  Low-salt and Diabetic / low-carbohydrate.    Discharge Medications:  Discharge Medication List as of 12/14/2012  2:59 PM      CONTINUE these medications which have CHANGED    Details   furosemide (LASIX) 20 MG tablet Take 2 tablets by mouth daily., Disp-60 tablet, R-0, ePrescribe      spironolactone (ALDACTONE) 25 MG tablet Take 4 tablets by mouth daily., Disp-60 tablet, R-0, ePrescribe         CONTINUE these medications which have NOT CHANGED    Details   bacitracin 500 UNIT/GM ointment Apply small amount to affected area on face twice daily, Disp-1 Tube, R-1, Fax      benzonatate (TESSALON) 100 MG capsule Take 1 capsule by mouth 3 times daily as needed for Cough., Disp-60 capsule, R-0, ePrescribe      CALCIUM CARBONATE 600 MG OR TABS 1 tablet 2 times daily, Oral, Disp-one month, R-11, Normal      glipiZIDE (GLUCOTROL) 10 MG tablet Take 1 tablet by mouth 2 times daily., 10 mg, Oral, 2 TIMES DAILY Starting 04/09/2010, Until Discontinued, Disp-60 tablet, R-3, Fax      guaiFENesin-codeine (ROBITUSSIN AC) 100-10 MG/5ML oral solution Take 5 mLs  by mouth every 6 hours as needed for Cough., Disp-120 mL, R-0, Security Rx Print      insulin glargine (LANTUS) 100 UNIT/ML injection Inject 10 Units under the skin nightly., 10 Units, Subcutaneous,  NIGHTLY Starting 06/13/2009, Until Discontinued, Disp-1 Vial, R-2, Fax      multivitamin (MULTI-VITAMIN) tablet Take 1 tablet by mouth daily., Historical Med      ondansetron (ZOFRAN) 4 MG tablet Take 1 tablet by mouth every 8 hours as needed for Nausea/Vomiting., 4 mg, Oral, EVERY 8 HOURS PRN Starting 06/21/2011, Until Discontinued, Disp-15 tablet, R-0, Security Rx Print      rifaximin (XIFAXAN) 550 MG tablet Take 550 mg by mouth every 12 hours., 550 mg, Oral, EVERY 12 HOURS, Until Discontinued, Historical Med      traMADol (ULTRAM) 50 MG tablet Take 1 tablet by mouth every 6 hours as needed for Moderate Pain (Pain Score 4-6)., 50 mg, Oral, EVERY 6 HOURS PRN Starting 12/28/2010, Until Discontinued, Disp-30 tablet, R-0, ePrescribe      VITAMIN D 400 UNIT OR CAPS 1 CAPSULE DAILY, Oral, Disp-30, R-11, Normal         STOP taking these medications       HYDROcodone-acetaminophen (VICODIN) 5-500 MG per tablet Comments:   Reason for Stopping:         propranolol (INDERAL) 10 MG tablet Comments:   Reason for Stopping:               Allergies:  Allergies   Allergen Reactions    Advil (Ibuprofen Micronized) Other     Intolerance           Discharge Disposition:  Home.    Discharge Code Status:  Full code / full care  This code status is not changed from the time of admission.    Follow Up Appointments:    Scheduled appointments:  Future Appointments  Date Time Provider Department Center   02/04/2013 10:00 AM Baltazar Apo, NP [facility]       For appointments requested for after discharge that have not yet been scheduled, refer to the Post Discharge Referrals section of the After Visit Summary.    Discharging Physician's Contact Information:  [facility] operator at [phone].

## 2012-12-14 NOTE — Interdisciplinary (Signed)
Orders in to discharge patient. Pt reports feeling well, denies SOB. Discharge instructions printed, provided and explained to both patient and family members. Instructed on changed medication doses. Pt and family verbalized understanding. Prescriptions called in to [facility]. Pts husband will pick up patients medications on their way home. IV removed. Pt getting dressed. Awaiting transportation, than able to leave in stable condition.

## 2012-12-14 NOTE — Interdisciplinary (Signed)
Clinical Nutrition Initial Assessment:   Acknowledged DTR trigger for weight loss  PT seen at high and assessed at moderate nutritional risk.     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 In the ER she had a thoracentesis with removal of 1L. After which her dyspnea was much improved.     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    Type II or unspecified type diabetes mellitus with peripheral circulatory disorders, not stated as uncontrolled(250.70) 06/10/2007    HTN 06/10/2007    HTN 06/10/2007     HT/WT:  Height: 5\' 1"  (154.9 cm)  Weight - scale: 53.978 kg (119 lb) Pt reports an unintentional 11# weight loss (8%) x1 month which she attributes to not eating well as she has been sick for the past month.      IBW: 105# %IBW: 113% UBW: 130# BMI: 22.5    Diet Rx: 2gm Na + Beneprotein 5x daily + Glucerna TID (NKFA)  GI: LBM: 1x this am Abd: +BS, soft, NTND per MD note on 5/5  PO Intake: 4/4 (100%) x2 2gm Na meals. Pt reports improved appetite and enjoys the food here as she has not been eating well for the month leading up to admit. Discussed Glucerna supplement with pt and family and pt is willing to try with meal. Also discussed high protein foods for menu selecting.    I/O: inadequate documentation as pt was admitted yesterday - Hydration adequacy deferred to MD Net since admit: +36ml     Labs: (5/6) [place]  8.5 (corrected to 9.2 with Alb 3.1), ALT/AST/T-bili 44/40/1.6 trending up, phos 2.6  BS POCT: (5/6)91-222, (5/5) 69-313  Nutr Related MEDS: lasix, lantus 10 units, lispro SSI, zofran prn, rifaximin, aldactone  Skin: WDL per head to toe assessment on 5/6    Est Needs: REE per Mifflin-St. Jeor (based on 54kg) = 998 x 1.5-1.7 = 1610-9604 Kcals (28-32 Kcal/kg), 54-81 gm protein (1-1.5 gm/kg)  Fluid Needs: Deferred to MD as pt is on lasix      D: Inadequate protein and calorie intake r/t current medical condition and previously poor appetite AEB 8% wt loss PTA      I: GOAL: (5/6) meet >75% of estimated needs w/ acceptable tolerance    PLAN:   1. REC add carb limited component to current 2gm Na diet order. Will continue Glucerna TID   2. REC d/c beneprotein as pt is receiving adequate protein with meals and Glucerna   3. REC check 25-OH Vitamin D3 and supplement with cholecalciferol as indicated   4. REC adding bolus insulin to keep BG-POCT <150 mg/dL  5. REC adding daily multivitamin with minerals   6. REC daily weights to monitor and trend  Discharge: likely carb limited, 2gm Na diet    Education: Reviewed with pt and family high protein foods for menu selections  POC was not paged to MD Trimzi given pt is discharged.     RD to monitor/evaluate: labs, wt trend, GI condition, po intake and adequacy, and S/S of new skin concerns.   Will f/u per policy    Salley Hews, Dietetic Intern: Pager [phone]    Above note reviewed and agree with current POC. When attempted to review the note, noted pt is already discharged(no d/c orders noted earlier while working on pt)  Altamease Oiler, MS, RD [phone]

## 2012-12-14 NOTE — ED Provider Notes (Signed)
History  Chief Complaint   Patient presents with    High Blood Sugar     has been taking prednisone and prmethazine w./ codeine x 2 days.  has been noticeing elevated FSG at home since taking codeine and cough syrup. pt stopped prednisone/syrup today.  tonight w/ "high" FSG at home at 2200,  in triage: FSG=478     HPI  72 year old F with h/o DMII, HCV with varices, COPD, recent pna for which she was treated here at [place] and has completed outpatient course of levofloxacin, had persistent cough for which her PMD started course of prednisone, now with 2 days of persistently elevated blood sugar.  Called PMD who told her to dc prednisone (took 2 days of therapy).  Currently on glucotrol with PM lantus for diabetes.  Endorses mild dizziness, polyuria, polydipsia.  Denies chest pain, fever/chills, has mild cough still, no wheezing or dyspnea.      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    Type II or unspecified type diabetes mellitus with peripheral circulatory disorders, not stated as uncontrolled(250.70) 06/10/2007    HTN 06/10/2007    HTN 06/10/2007       Past Surgical History   Procedure Laterality Date    Pb laps surg cholecstc w/expl common duct  1970s     in [place];  open CCX    Pb cesarean delivery only      Pb cstoplasty/cstourtp plstc any       Bladder suspension       FH: DM    History   Substance Use Topics    Smoking status: Former Smoker -- 10 years     Quit date: 11/13/2012    Smokeless tobacco: Never Used    Alcohol Use: No       Review of Systems    Constitutional: Negative for fever and chills.   HENT: Negative for congestion, facial swelling, rhinorrhea and neck pain.    Eyes: Negative for pain and visual disturbance.   Respiratory: Positive for cough. Negative for shortness of breath.    Cardiovascular: Negative for chest pain.   Gastrointestinal: Negative for nausea, vomiting and abdominal pain.   Endocrine: Positive for polydipsia and polyuria.   Genitourinary: Negative for dysuria, hematuria, flank pain, vaginal bleeding, vaginal discharge and difficulty urinating.   Musculoskeletal: Negative for back pain and gait problem.   Skin: Negative for rash and wound.   Allergic/Immunologic: Negative for immunocompromised state.   Neurological: Negative for weakness and headaches.   Hematological: Does not bruise/bleed easily.   Psychiatric/Behavioral: Negative for suicidal ideas.       Physical Exam  BP 136/76  Pulse 48  Temp(Src) 98.1 F (36.7 C)  Resp 15  Wt 53.978 kg (119 lb)  BMI 19.8 kg/m2  SpO2 97%    Physical Exam   Nursing note and vitals reviewed.  Constitutional: She is oriented to person,  place, and time. She appears well-developed. No distress.   HENT:   Head: Normocephalic and atraumatic.   Mouth/Throat: Oropharynx is clear and moist. No oropharyngeal exudate.   Eyes: Conjunctivae and EOM are normal. Pupils are equal, round, and reactive to light. No scleral icterus.   Neck: Normal range of motion.   Cardiovascular: Normal rate, regular rhythm and intact distal pulses.    Murmur heard.  Pulmonary/Chest: Effort normal and breath sounds normal. No respiratory distress. She has no wheezes.   Abdominal: Soft. There is no tenderness.   Musculoskeletal: Normal range of motion. She exhibits no tenderness.   Neurological: She is alert and oriented to person, place, and time.   Skin: Skin is warm and dry. No erythema.       ED Course/Medical Decision Making Narrative  72 year old F with hyperglycemia.  Presume 2/2 steroid use, no e/o infection,  infarction.  FSG near 500.  Will check labs, give IV fluids, sq/iv insulin, ekg/cxr.  Once sugar controlled, likely safe for dc.  She has good f/u, in next 2 days with her PMD.  Considered adding insulin however as she is only on PM insulin, think safer not to alter regimen given prompt f/u with doctor.  Reassess.    +glucosuria, no ketones, pseudohyponatremia noted, cbc with anemia and thrombocytopenia which patient has at baseline.  No persistent pna on cxr.  Sugar downtrending to 253 with fluids, insulin.  DC home.  No change to regimen, prednisone dc'ed, f/u pmd in 2 days.  Return precautions provided.         Case discussed with Dr. Milus Mallick.             Critical Care Time            Additional Notes      Home Medication List  Prior to Admission Medications   Outpatient Medications Last Dose Informant Patient Reported? Taking?   CALCIUM CARBONATE 600 MG OR TABS   No No   Sig: 1 tablet 2 times daily   HYDROcodone-acetaminophen (VICODIN) 5-500 MG per tablet   No No   Sig: Take 1 tablet by mouth every 6 hours as needed for Moderate Pain (Pain Score 4-6) or Severe Pain (Pain Score 7-10).   VITAMIN D 400 UNIT OR CAPS   No No   Sig: 1 CAPSULE DAILY   bacitracin 500 UNIT/GM ointment   No No   Sig: Apply small amount to affected area on face twice daily   benzonatate (TESSALON) 100 MG capsule   No No   Sig: Take 1 capsule by mouth 3 times daily as needed for Cough.   furosemide (LASIX) 20 MG tablet   No No   Sig: Take 1 tablet by mouth daily.   glipiZIDE (GLUCOTROL) 10 MG tablet   No No   Sig: Take 1 tablet by mouth 2 times daily.   guaiFENesin-codeine (ROBITUSSIN AC) 100-10 MG/5ML oral solution   No No   Sig: Take 5 mLs by mouth every 6 hours as needed for Cough.   insulin glargine (LANTUS) 100 UNIT/ML injection   No No   Sig: Inject 10 Units under the skin nightly.   multivitamin (MULTI-VITAMIN) tablet   Yes No   Sig: Take 1 tablet by mouth daily.   ondansetron (ZOFRAN) 4 MG tablet   No No   Sig: Take 1 tablet by  mouth every 8 hours as needed for Nausea/Vomiting.   propranolol (INDERAL) 10 MG tablet   No  No   Sig: Take 1 tablet by mouth 3 times daily.   rifaximin (XIFAXAN) 550 MG tablet   Yes No   Sig: Take 550 mg by mouth every 12 hours.   spironolactone (ALDACTONE) 25 MG tablet   No No   Sig: Take 2 tablets by mouth daily.   traMADol (ULTRAM) 50 MG tablet   No No   Sig: Take 1 tablet by mouth every 6 hours as needed for Moderate Pain (Pain Score 4-6).      Facility-Administered Medications: None       Barnabas Lister, MD  Resident  12/14/12 [phone]

## 2012-12-14 NOTE — Interdisciplinary (Signed)
Acknowledged nursing wt loss trigger:    S/w pt's son who reports pt lost an unintentional 41# x2 months (26% loss) that pt's son attributes to pt having pneumonia and fluid build up in lungs making it difficult to eat.  Pt's UBW is 160# and currently weighs 119# @ 113% Pt's son reports this issue has resolved and pt has been eating better.  Pt's son requesting to receive Glucerna supplements TID.  Relayed findings to RD.  Will continue to f/u per policy.     Percell Miller, DTR

## 2012-12-15 DIAGNOSIS — R0600 Dyspnea, unspecified: Secondary | ICD-10-CM | POA: Diagnosis present

## 2012-12-15 NOTE — Progress Notes (Signed)
I have evaluated the patient today; she will be discharged from the hospital today.     Today, her physical examination is notable for    BP 111/55  Pulse 69  Temp(Src) 97.6 F (36.4 C)  Resp 16  SpO2 94% on RA    General: no confusion    HENT: NCAT, mmm, poor dentition   Eyes:PERRL, conjunctivae clear   CV: regular, no murmurs appreciated, no heave   Resp: decrease BS left base  GI: abdomen soft, NTTP, +BS   Skin: warm, dry, no rashes   Psych; AAOx3, NAD, good insight/judgement     Please refer to the Discharge Summary for further details.    I spent >30 minutes on the patient's care unit in the preparation and execution of the hospital discharge.    Javonna Balli H Santosha Jividen

## 2012-12-15 NOTE — ED Attending Note (Signed)
ED ATTENDING NOTE:    Primary Care Provider: Sherrie Mustache    Chief Complaint:   Chief Complaint   Patient presents with    Cough     Pt endorsing cough and trouble breathing beginning yesterday afternoon and gradually getting worse pain. Pt also endorsing tightness in chest. Dry cough present.       History of Present Illness:   Paula Manning is a 72 year old female with pmhX HEP C, HTN, cirrhosis and as noted below presents to the emergency department with cough for the past several days. Patient states her cough has been productive and that she has been felling increasingly short of breath. sHE DEOS REMARK OF HER HUSBAND AS A SICK CONTACT recently.     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  Blood type of O positive.  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        Hypertensive disorder 06/10/2007       Past Surgical History   Procedure Laterality Date    Pb laps surg  cholecstc w/expl common duct  1970s     in [place];  open CCX    Pb cesarean delivery only      Pb cstoplasty/cstourtp plstc any       Bladder suspension     Allergies:  Allergies   Allergen Reactions    Advil (Ibuprofen Micronized) Other     Intolerance         Social History:   History     Social History    Marital Status: Married     Spouse Name: N/A     Number of Children: N/A    Years of Education: N/A     Social History Main Topics    Smoking status: Former Smoker -- 10 years     Quit date: 11/13/2012    Smokeless tobacco: Never Used    Alcohol Use: No    Drug Use: Not on file    Sexually Active: Not on file     Other Topics Concern    Not on file     Social History Narrative    No narrative on file     Immunizations:  Immunization History   Administered Date(s) Administered  H1N1 Vaccine 08/24/2008    Influenza Vaccine =>3 Y/O Preservative Free 05/18/2008    Pneumococcal Vaccine, Adult (Pneumovax) 12/28/2010    Tdap 04/19/2009     ROS:  Pertinent positives and negatives discussed in HPI    PE:  BP 114/57  Pulse 64  Temp(Src) 98 F (36.7 C)  Resp 17  Ht 5\' 5"  (1.651 m)  Wt 57.9 kg (127 lb 10.3 oz)  BMI 21.24 kg/m2  SpO2 95%   Nursing note and vitals reviewed:  Vitals reviewed and noted gto be hypoxic, otw nl.    Constitutional: Patient is alert and oriented to person, place, time.  Well-developed. Well-nourished. Mild-mod resp distress, non-toxic appearing.  Eyes: Conjunctivae and extraocular motions are normal.   Neck: Neck supple.   Cardiovascular: Normal rate, regular rhythm, normal heart sounds and intact distal pulses.  No murmur heard. No pedal edema  Pulmonary/Chest: Tachypneic, wheezing b/l, no rales.  Abdominal: Soft. Normal bowel sounds. No distension and no mass.  No tenderness to palpation. No rebound.   Musculoskeletal: Normal range of motion.   Neurological: Mentation appropriate, MAE.  Skin: Skin is warm and dry. No rash. Not diaphoretic. No erythema. No  pallor.    INITIAL IMPRESSION/MEDICAL DECISION MAKING:  Patient with shortness of breath and prod cough concerning for PNA with wheezing on exam with hypoxia and sick contacts. CXR reveals potential PNA in setting of hypoxia. Will admit and provide breathing treatment as well.    Case discussed with Dr. Rogelia Rohrer.    I have discussed the patient's diagnosis and/or differential diagnosis with the patient/family as well as proposed treatment and plan of follow-up. The patient/family was given a chance to ask questions and have them answered. The patient/family understands the plan of care.    Electronically signed by   Antonietta Jewel

## 2012-12-18 LAB — STERILE FLUID CULTURE W/GRAM STAIN, AEROBIC
Fluid Culture: NO GROWTH
Gram Stain: NONE SEEN

## 2012-12-20 LAB — ANAEROBIC CULTURE

## 2012-12-22 ENCOUNTER — Ambulatory Visit: Payer: Medicare Other | Attending: Family | Admitting: Family

## 2012-12-22 VITALS — BP 130/56 | HR 62 | Temp 97.8°F | Resp 16 | Ht 61.0 in | Wt 118.0 lb

## 2012-12-22 DIAGNOSIS — K7460 Unspecified cirrhosis of liver: Secondary | ICD-10-CM | POA: Insufficient documentation

## 2012-12-22 DIAGNOSIS — J438 Other emphysema: Secondary | ICD-10-CM | POA: Insufficient documentation

## 2012-12-22 DIAGNOSIS — I8500 Esophageal varices without bleeding: Secondary | ICD-10-CM | POA: Insufficient documentation

## 2012-12-22 DIAGNOSIS — R188 Other ascites: Secondary | ICD-10-CM | POA: Insufficient documentation

## 2012-12-22 MED ORDER — PROPRANOLOL HCL 10 MG OR TABS
10.00 mg | ORAL_TABLET | Freq: Two times a day (BID) | ORAL | Status: DC
Start: ? — End: 2013-06-23

## 2012-12-22 NOTE — Patient Instructions (Signed)
1. Lab work prior to next appointment. Orders in computer    If you have any questions or concerns please do not hesitate to call your care team at [phone].     Thank you,    Leone Payor, MD  Assistant Clinical Professor of Medicine  Director of Hepatology  [facility]    Erin Hearing, NP  Hepatology Nurse Practitioner  [facility]    Clarita Crane, [place]  Hepatology Nurse: refill medications or medical questions  [facility]  D. Line [phone]    Rockie Neighbours  Administrative Assistant: appointments, procedures or insurance issues  [facility]  D. Line [phone]

## 2012-12-22 NOTE — Interdisciplinary (Signed)
942 Written and verbal instructions  given to patient. Verbalized  Understanding. D/C ambulatory using a cane, with family,  in no apparent distress.   Patient Education  Identified learning needs: Medication,uses,dose,side effects.  Learner: patient and family member   Barriers to learning:   Readiness to learn: acceptance  Method: Explanation and handout.  Treatment education given:   Fall prevention education given:  Pain education given:   Response: Verbalized understanding    .

## 2012-12-22 NOTE — Progress Notes (Signed)
Dec 22, 2012    Location: [facility]     Hepatologist: Leone Payor, MD    Visit Provider: Erin Hearing, NP     Chief Complaint   Patient presents with   . Hepatitis C   . 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 She was treated for her HCV and achieved an SVR. She was last seen in this clinic 09/02/2012. In the interim she was hospitalized 5/5-5/6 for dyspnea and found to have a pleural effusion. She had a thoracentesis and her diuretics were increased. She has underlying emphysema. Her weight is stable. She continues to cough but is not having labored breathing. She remains afebrile. She has an appointment with her PCP Dr. Megan Salon on Friday.     She is here today with her son [name].         Patient Active Problem List    Diagnosis Date Noted   . Acute dyspnea 12/15/2012   . CAP (community acquired pneumonia) 11/30/2012   . Hypoxia 11/30/2012   . Nasal bone fx-closed 10/17/2011   . Syncope 10/17/2011   . Status post thoracentesis 12/28/2010   . Pleural effusion associated with hepatic disorder 12/27/2010   . Fracture of proximal humerus 08/14/2009   . Hypertensive disorder 12/12/2008   . Hyperlipidemia 12/12/2008   . Microalbuminuria 12/12/2008   . Osteoporosis 07/26/2007   . Ascites 06/29/2007     History of paracentesis x 2     . 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 Cirrhosis of liver 06/24/2007     Removed from liver transplant waiting list due to low MELD and low likelihood of requiring a transplant in the near future.  Blood type of O positive.  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 Emphysema 06/24/2007   . Anemia 06/10/2007   . EV (esophageal varices) 06/10/2007   . Seizure Disorder 06/10/2007   . DM circ dis type II 06/10/2007   . Viral hepatitis C 06/10/2007     H/o GT2  but has achieved SVR.       Marland Kitchen Hypertensive disorder 06/10/2007         Past Medical History   Diagnosis Date   . Migraine headache    . Glaucoma    . Ascites 06/29/2007     History of paracentesis x 2   . 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 Cirrhosis 06/24/2007     Active on the liver transplant waiting list with a blood type of O positive. 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 Osteopenia 06/24/2007   . Emphysema 06/24/2007   . EV (esophageal varices) 06/10/2007   . Anemia 06/10/2007   . HCV (hepatitis C virus) 06/10/2007     Achieved SVR.   Marland Kitchen Type II or unspecified type diabetes mellitus with peripheral circulatory disorders, not stated as uncontrolled(250.70) 06/10/2007   . HTN 06/10/2007   . HTN 06/10/2007       Allergies   Allergen Reactions   . Advil (Ibuprofen Micronized) Other     Intolerance           Current Outpatient Prescriptions  on File Prior to Visit   Medication Sig Dispense Refill   . bacitracin 500 UNIT/GM ointment Apply small amount to affected area on face twice daily  1 Tube  1   . benzonatate (TESSALON) 100 MG capsule Take 1 capsule by mouth 3 times daily as needed for Cough.  60 capsule  0   . CALCIUM CARBONATE 600 MG OR TABS 1 tablet 2 times daily  one month  11   . furosemide (LASIX) 20 MG tablet Take 2 tablets by mouth daily.  60 tablet  0   . glipiZIDE (GLUCOTROL) 10 MG tablet Take 1 tablet by mouth 2 times daily.  60 tablet  3   . guaiFENesin-codeine (ROBITUSSIN AC) 100-10 MG/5ML oral solution Take 5 mLs by mouth every 6 hours as needed for Cough.  120 mL  0   . insulin glargine (LANTUS) 100 UNIT/ML injection Inject 10 Units under the skin nightly.  1 Vial  2   . multivitamin (MULTI-VITAMIN) tablet Take 1 tablet by mouth daily.       . ondansetron (ZOFRAN) 4 MG tablet Take 1 tablet by mouth every 8 hours as needed for Nausea/Vomiting.  15 tablet  0   . rifaximin (XIFAXAN) 550 MG tablet Take 550 mg by mouth every 12 hours.       Marland Kitchen spironolactone (ALDACTONE) 25 MG  tablet Take 4 tablets by mouth daily.  60 tablet  0   . traMADol (ULTRAM) 50 MG tablet Take 1 tablet by mouth every 6 hours as needed for Moderate Pain (Pain Score 4-6).  30 tablet  0   . VITAMIN D 400 UNIT OR CAPS 1 CAPSULE DAILY  30  11     No current facility-administered medications on file prior to visit.       History     Social History   . Marital Status: Married     Spouse Name: N/A     Number of Children: N/A   . Years of Education: N/A     Occupational History   . Not on file.     Social History Main Topics   . Smoking status: Former Smoker -- 10 years     Quit date: 11/13/2012   . Smokeless tobacco: Never Used   . Alcohol Use: No   . Drug Use: Not on file   . Sexually Active: Not on file     Other Topics Concern   . Not on file     Social History Narrative   . No narrative on file        No family history on file.     Past Surgical History   Procedure Laterality Date   . Pb laps surg cholecstc w/expl common duct  1970s     in [place];  open CCX   . Pb cesarean delivery only     . Pb cstoplasty/cstourtp plstc any       Bladder suspension           Review of Systems -  Constitutional: no fever, chills or weight loss  Eyes: no vision changes  HENT: no headaches, nose bleeds or oral lesions  Cardiovascular: no chest pain or peripheral edema  Respiratory: no SOB, dyspnea or hemoptysis  Gastrointestinal: no  nausea, vomiting, or hematemesis. No abdominal distention, + abdominal hernia  Genitourinary: no dysuria  Musculoskeletal: no pain or swelling of muscles or joints  Integumentary: no rashes or jaundice  Neurological: no tremors or difficulties with memory  Psychiatric: Negative  Endocrine: no cold/heat intolerance, no excessive sweating or polyuria  Hematologic/Lymphatic: no easy bruising or bleeding  Allergic/Immunologic: negative, see allergy list        Physical Exam: (9 systems examined)     Vital Signs: BP 130/56  Pulse 62  Temp(Src) 97.8 F (36.6 C) (Oral)  Resp 16  Ht 5\' 1"  (1.549 m)  Wt 53.524  kg (118 lb)  BMI 22.31 kg/m2  SpO2 96%     BMI: Body mass index is 22.31 kg/(m^2).     Systems:  GEN: appears well, in no apparent distress. Alert and oriented times three, pleasant and cooperative.  HEENT: no sclera icterus, O/P: mmm no lesions  Lymph nodes:  negative lymph nodes  Pulmonary: moves air well bilaterally, some expiratory wheezes  Cardiovascular: regular rate & rhythm,  no murmurs, gallops or rubs, no peripheral edema  Gastrointestinal: BS x4, soft NTND, liver and spleen not palpable, no ascites, abdominal scar RUQ s/p cholecystectomy, + reducible RUQ hernia  Rectal and genitalia: Not examined  Neurologic including mental status: Alert & oriented x3. No encephalopathy or asterixis  Dermatologic:no jaundice, rash, or spiders  Musculoskeletal: Normal tone        Laboratory:   Results for orders placed during the hospital encounter of 12/13/12   BASIC METABOLIC PANEL, BLOOD       Result Value Range    Glucose 117 (*) 70 - 115 mg/dL    BUN 15  6 - 20 mg/dL    Creatinine 9.60  4.54 - 0.95 mg/dL    GFR >09      Sodium 139  136 - 145 mmol/L    Potassium 3.6  3.5 - 5.1 mmol/L    Chloride 106  98 - 107 mmol/L    Bicarbonate 26  22 - 29 mmol/L    Calcium 8.2 (*) 8.8 - 10.2 mg/dL   MAGNESIUM, BLOOD       Result Value Range    Magnesium 1.8  1.7 - 2.6 mg/dL   PHOSPHORUS, BLOOD       Result Value Range    Phosphorous 2.6 (*) 2.7 - 4.5 mg/dL   URINALYSIS       Result Value Range    Type Not Specified      Color Straw  Yellow    Appearance Clear  Clear    Specific Gravity 1.007  1.002 - 1.030    pH 6.5  5.0 - 8.0    Protein Negative  Negative    Glucose Negative  Negative    Ketones Negative  Negative    Bilirubin Negative  Negative    Blood Moderate (*) Negative    Urobilinogen 0.2-1.0  0.2-1 EU/dL    Nitrite Negative  Negative    Leuk Esterase Negative  Negative    WBC <1  0-2/HPF    RBC 6-10  0-2/HPF    Squam. Epithelial Cell <1  0-Few/HPF   BLOOD CULTURE ROUTINE       Result Value Range    Blood Culture No  Growth after 5 day/s of incubation.     BLOOD CULTURE ROUTINE       Result Value Range    Blood Culture No Growth after 5 day/s of incubation.     CBC WITH ADIFF, BLOOD       Result Value Range    WBC 4.6  4.0 - 10.0 1000/mm3    RBC 3.56 (*) 3.90 - 5.20 mill/mm3    Hgb 11.7  11.2 - 15.7 gm/dL    Hct 16.1 (*) 09.6 - 45.0 %    MCV 91.3  79.0 - 95.0 um3    MCH 32.9 (*) 26.0 - 32.0 pgm    MCHC 36.0  32.0 - 36.0 %    RDW 15.1 (*) 12.0 - 14.0 %    MPV 10.2  9.4 - 12.4 fL    Plt Count 44 (*) 140 - 370 1000/mm3    Segs 67  34 - 71 %    Lymphocytes 19  19 - 53 %    Monocytes 11  5 - 12 %    Eosinophils 3  1 - 7 %    Absolute Neutrophil Count 3.1  1.6 - 7.0 1000/mm3    Abs Lymphs 0.9  0.8 - 3.1 1000/mm3    Abs Monos 0.5  0.2 - 0.8 1000/mm3    Abs Eosinophils 0.1  0.0 - 0.5 1000/mm3    Diff Type Automated     PROTHROMBIN TIME, BLOOD       Result Value Range    PT,Patient 16.2 (*) 9.7 - 12.5 sec    INR 1.5     APTT, BLOOD       Result Value Range    PTT 26.0  25.0 - 34.0 sec   BODY FLUID CELL CT / DIFF       Result Value Range    Fluid Type Pleural      Fluid RBC 1550      Fluid WBC 198      Neutrophils Fluid 2      Lymphocytes fluid 50      Macrophages fluid 46      Mesothelial Cells Fluid 2     ALBUMIN, OTHER       Result Value Range    Specimen Pleural      Albumin, Other 0.5     TOTAL PROTEIN, OTHER       Result Value Range    Total Protein, Other 1.2     LDH, OTHER       Result Value Range    LDH, Other 48     STERILE FLUID CULTURE W/GRAM STAIN, AEROBIC       Result Value Range    Gram Stain        Value: No organisms or cells seen      (Performed at [facility]    Fluid Culture No Growth     ANAEROBIC CULTURE       Result Value Range    Anaerobic Culture No Anaerobes isolated     MRSA SURVEILLANCE CULTURE       Result Value Range    MRSA Surveillance Culture        Value: No Methicillin Resistant Staphylococcus aureus isolated.   GLUCOSE (POCT)       Result Value Range    Glucose (POCT) 69 (*) 70 - 115 mg/dL   GLUCOSE  (POCT)       Result Value Range    Glucose (POCT) 102  70 - 115 mg/dL   GLUCOSE (POCT)       Result Value Range    Glucose (POCT) 142 (*) 70 - 115 mg/dL   GLUCOSE (POCT)       Result Value Range    Glucose (POCT) 272 (*) 70 - 115 mg/dL   GLUCOSE (POCT)       Result Value Range    Glucose (POCT) 206 (*) 70 - 115 mg/dL  GLUCOSE (POCT)       Result Value Range    Glucose (POCT) 313 (*) 70 - 115 mg/dL   COMPREHENSIVE METABOLIC PANEL, BLOOD       Result Value Range    Glucose 101  70 - 115 mg/dL    BUN 12  6 - 20 mg/dL    Creatinine 1.61  0.96 - 0.95 mg/dL    GFR >04      Sodium 138  136 - 145 mmol/L    Potassium 4.2  3.5 - 5.1 mmol/L    Chloride 103  98 - 107 mmol/L    Bicarbonate 30 (*) 22 - 29 mmol/L    Calcium 8.5 (*) 8.8 - 10.2 mg/dL    Total Protein 7.0  6.0 - 8.0 g/dL    Albumin 3.1 (*) 3.5 - 5.2 g/dL    Bilirubin, Tot 1.6 (*) <1.2 mg/dL    AST (SGOT) 40 (*) 0 - 32 U/L    ALT (SGPT) 44 (*) 0 - 33 U/L    Alkaline Phos 131  35 - 140 U/L   CBC WITH ADIFF, BLOOD       Result Value Range    WBC 5.6  4.0 - 10.0 1000/mm3    RBC 3.90  3.90 - 5.20 mill/mm3    Hgb 12.8  11.2 - 15.7 gm/dL    Hct 54.0  98.1 - 19.1 %    MCV 92.1  79.0 - 95.0 um3    MCH 32.8 (*) 26.0 - 32.0 pgm    MCHC 35.7  32.0 - 36.0 %    RDW 15.1 (*) 12.0 - 14.0 %    MPV 10.5  9.4 - 12.4 fL    Plt Count 54 (*) 140 - 370 1000/mm3    Segs 73 (*) 34 - 71 %    Lymphocytes 14 (*) 19 - 53 %    Monocytes 8  5 - 12 %    Eosinophils 4  1 - 7 %    Absolute Neutrophil Count 4.1  1.6 - 7.0 1000/mm3    Abs Lymphs 0.8  0.8 - 3.1 1000/mm3    Abs Monos 0.4  0.2 - 0.8 1000/mm3    Abs Eosinophils 0.3  0.0 - 0.5 1000/mm3    Diff Type Automated     PROTHROMBIN TIME, BLOOD       Result Value Range    PT,Patient 17.5 (*) 9.7 - 12.5 sec    INR 1.7     GLUCOSE (POCT)       Result Value Range    Glucose (POCT) 91  70 - 115 mg/dL   GLUCOSE (POCT)       Result Value Range    Glucose (POCT) 222 (*) 70 - 115 mg/dL   ECG 47-WGNF       Result Value Range    VENTRICULAR RATE 62       ATRIAL RATE 62      PR INTERVAL 106      QRS INTERVAL/DURATION 82      QT 456      QTC INTERVAL 462      P AXIS 87      R AXIS 34      T AXIS 46      ECG INTERPRETATION        Value: Sinus rhythm with sinus arrhythmia with short PR      Otherwise normal ECG  Confirmed by Above generated by computer only, Results in ED notes (204),       editor HADNOT, ANTHONY ([id number]) on 12/14/2012 8:56:25 AM        Endoscopy:    Endoscopic Diagnosis: 12/17/11  1. Small distal esophageal varices.   2. Moderate portal hypertensive gastropathy.   3. Small hiatal hernia.   Recommendations: 1. Continue beta blocker   (Inderal/propranolol).   2. Repeat EGD in 1-2 years for variceal surveillance.    Imaging:  MRI Abdomen 12/14/2012  1. 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    2. Minimal iron overload and additional chronic findings as on comparison.    3. 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   Over the years, there has been progressive enlargement of side branch   component, although no definite change since most recent MR.    4. No LI-RADS observations.      I have reviewed all lab documents in media and imaging and procedures in EPIC    Assessment and Plan: 72 yo hispanic female HCV GT 2 cirrhosis (achieved SVR)  fairly well compensated. 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    1. Cirrhosis: MELD 14, CPT A.     2. CHC GT 2: achieved SVR.    3. HCC surveillance: UTD. Repeat imaging 01/2013 with u/s.  - Will alternate with MRI to assess the IPMN    4. EV surveillance: repeat surveillance:   - repeat 1- 2 years or sooner if if synthetic function worsens    5. Ventral hernia:   - abdominal binder  - evaluated by surgeon and declined for repair  - advised her to call or go to the ER if she develops pain with nausea, vomiting or fever in  case the hernia is strangulated    6. Fluid retention.   - well controlled with lasix 40 mg daily and aldactone 100 mg daily   - no ascites seen with recent MRI    7.  Diabetes: A1c 6.0. Managed by PCP  - on glipizide 10 mg twice daily  - on lantus    8. Pleural effusion: underlying emphysema  - continue with diurertics  - follow up with PCP friday  - advised if symptoms worsen (fever or increase SOB) to go to ED    9. Return to clinic: 3 months (labs prior to appointment)    Erin Hearing, NP

## 2013-02-04 ENCOUNTER — Ambulatory Visit (HOSPITAL_BASED_OUTPATIENT_CLINIC_OR_DEPARTMENT_OTHER): Payer: Medicare Other | Admitting: Family

## 2013-02-14 ENCOUNTER — Ambulatory Visit (INDEPENDENT_AMBULATORY_CARE_PROVIDER_SITE_OTHER): Payer: Medicare Other | Admitting: Ophthalmology

## 2013-02-14 ENCOUNTER — Encounter (INDEPENDENT_AMBULATORY_CARE_PROVIDER_SITE_OTHER): Payer: Medicare Other

## 2013-03-16 ENCOUNTER — Other Ambulatory Visit: Payer: Medicare Other | Attending: Family

## 2013-03-16 DIAGNOSIS — R188 Other ascites: Secondary | ICD-10-CM | POA: Insufficient documentation

## 2013-03-16 DIAGNOSIS — K7460 Unspecified cirrhosis of liver: Secondary | ICD-10-CM | POA: Insufficient documentation

## 2013-03-23 ENCOUNTER — Ambulatory Visit: Payer: Medicare Other | Attending: Family | Admitting: Family

## 2013-03-23 VITALS — BP 127/56 | HR 52 | Temp 98.1°F | Resp 16 | Ht 61.0 in | Wt 121.0 lb

## 2013-03-23 DIAGNOSIS — J438 Other emphysema: Secondary | ICD-10-CM | POA: Insufficient documentation

## 2013-03-23 DIAGNOSIS — K7460 Unspecified cirrhosis of liver: Secondary | ICD-10-CM | POA: Insufficient documentation

## 2013-03-23 DIAGNOSIS — R188 Other ascites: Secondary | ICD-10-CM | POA: Insufficient documentation

## 2013-03-23 DIAGNOSIS — I8500 Esophageal varices without bleeding: Secondary | ICD-10-CM | POA: Insufficient documentation

## 2013-03-23 NOTE — Progress Notes (Signed)
 March 23, 2013    Location: [facility]     Hepatologist: Darci East, MD    Visit Provider: Danna Duster, NP     Chief Complaint   Patient presents with   . 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 She was treated for her HCV and achieved an SVR. She was last seen in this clinic 12/22/2012. She was hospitalized 5/5-5/6 for dyspnea and found to have a pleural effusion. She had a thoracentesis and her diuretics were increased. She has underlying emphysema. Her weight is up ~ 3 lbs since her last visit. No complaints today regarding SOB.    She reports balance issues secondary to lower extremity weakness. She was evaluated by neurology and diagnosed with lumabr radiculopathy. She was advised she needed surgery but has refused as she does not feel she is strong enough to tolerate procedure. She is being cautious to avoid falls and uses a walker. Her family assists her with mobility.       Patient Active Problem List    Diagnosis Date Noted   . Acute dyspnea 12/15/2012   . CAP (community acquired pneumonia) 11/30/2012   . Hypoxia 11/30/2012   . Nasal bone fx-closed 10/17/2011   . Syncope 10/17/2011   . Status post thoracentesis 12/28/2010   . Pleural effusion associated with hepatic disorder 12/27/2010   . Fracture of proximal humerus 08/14/2009   . Hypertensive disorder 12/12/2008   . Hyperlipidemia 12/12/2008   . Microalbuminuria 12/12/2008   . Osteoporosis 07/26/2007   . Ascites 06/29/2007     History of paracentesis x 2     . 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 Cirrhosis of liver 06/24/2007     Removed from liver transplant waiting list due to low MELD and low likelihood of requiring a transplant in the near future.  Blood type of O positive.  Non-occlusive thrombus in the main portal vein initially seen on imaging  09/2008  Due to Hep C     . Emphysema 06/24/2007   . Anemia 06/10/2007   . EV (esophageal varices) 06/10/2007   . Seizure Disorder 06/10/2007   . DM circ dis type II 06/10/2007   . Viral hepatitis C 06/10/2007     H/o GT2 but has achieved SVR.       Paula Manning Hypertensive disorder 06/10/2007         Past Medical History   Diagnosis Date   . Migraine headache    . Glaucoma    . Ascites 06/29/2007     History of paracentesis x 2   . 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 Cirrhosis 06/24/2007     Active on the liver transplant waiting list with a blood type of O positive. 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 Osteopenia 06/24/2007   . Emphysema 06/24/2007   . EV (esophageal varices) 06/10/2007   . Anemia 06/10/2007   . HCV (hepatitis C virus) 06/10/2007     Achieved SVR.   Paula Manning Type II or unspecified type diabetes mellitus with peripheral circulatory disorders, not stated as uncontrolled(250.70) 06/10/2007   . HTN 06/10/2007   . HTN 06/10/2007       Allergies  Allergen Reactions   . Advil [Ibuprofen Micronized] Other     Intolerance           Current Outpatient Prescriptions on File Prior to Visit   Medication Sig Dispense Refill   . bacitracin  500 UNIT/GM ointment Apply small amount to affected area on face twice daily  1 Tube  1   . benzonatate  (TESSALON ) 100 MG capsule Take 1 capsule by mouth 3 times daily as needed for Cough.  60 capsule  0   . CALCIUM  CARBONATE 600 MG OR TABS 1 tablet 2 times daily  one month  11   . furosemide  (LASIX ) 20 MG tablet Take 2 tablets by mouth daily.  60 tablet  0   . glipiZIDE  (GLUCOTROL ) 10 MG tablet Take 1 tablet by mouth 2 times daily.  60 tablet  3   . guaiFENesin -codeine  (ROBITUSSIN AC) 100-10 MG/5ML oral solution Take 5 mLs by mouth every 6 hours as needed for Cough.  120 mL  0   . insulin  glargine (LANTUS ) 100 UNIT/ML injection Inject 10 Units under the skin nightly.  1 Vial  2   . multivitamin (MULTI-VITAMIN) tablet Take 1 tablet by mouth daily.       . ondansetron   (ZOFRAN ) 4 MG tablet Take 1 tablet by mouth every 8 hours as needed for Nausea/Vomiting.  15 tablet  0   . propranolol  (INDERAL ) 10 MG tablet Take 10 mg by mouth 2 times daily.       . rifaximin  (XIFAXAN ) 550 MG tablet Take 550 mg by mouth every 12 hours.       . spironolactone  (ALDACTONE ) 25 MG tablet Take 4 tablets by mouth daily.  60 tablet  0   . traMADol  (ULTRAM ) 50 MG tablet Take 1 tablet by mouth every 6 hours as needed for Moderate Pain (Pain Score 4-6).  30 tablet  0   . VITAMIN D  400 UNIT OR CAPS 1 CAPSULE DAILY  30  11     No current facility-administered medications on file prior to visit.       History     Social History   . Marital Status: Married     Spouse Name: N/A     Number of Children: N/A   . Years of Education: N/A     Occupational History   . Not on file.     Social History Main Topics   . Smoking status: Former Smoker -- 10 years     Quit date: 11/13/2012   . Smokeless tobacco: Never Used   . Alcohol Use: No   . Drug Use: Not on file   . Sexual Activity: Not on file     Other Topics Concern   . Not on file     Social History Narrative   . No narrative on file        No family history on file.     Past Surgical History   Procedure Laterality Date   . Pb laps surg cholecstc w/expl common duct  1970s     in [place];  open CCX   . Pb cesarean delivery only     . Pb cstoplasty/cstourtp plstc any       Bladder suspension           Review of Systems -  Constitutional: no fever, chills or weight loss  Eyes: no vision changes  HENT: no headaches, nose bleeds or oral lesions  Cardiovascular: no chest pain or peripheral edema  Respiratory:  no SOB, dyspnea or hemoptysis  Gastrointestinal: no  nausea, vomiting, or hematemesis. No abdominal distention, + abdominal hernia  Genitourinary: no dysuria  Musculoskeletal: no pain or swelling of muscles or joints  Integumentary: no rashes or jaundice  Neurological: no tremors or difficulties with memory  Psychiatric: Negative  Endocrine: no cold/heat intolerance,  no excessive sweating or polyuria  Hematologic/Lymphatic: no easy bruising or bleeding  Allergic/Immunologic: negative, see allergy list        Physical Exam: (9 systems examined)     Vital Signs: BP 127/56  Pulse 52  Temp(Src) 98.1 F (36.7 C) (Oral)  Resp 16  Ht 5\' 1"  (1.549 m)  Wt 54.885 kg (121 lb)  BMI 22.87 kg/m2  SpO2 100%     BMI: Body mass index is 22.87 kg/(m^2).     Systems:  GEN: appears well, in no apparent distress. Alert and oriented times three, pleasant and cooperative.  HEENT: no sclera icterus, O/P: mmm no lesions  Lymph nodes:  negative lymph nodes  Pulmonary: moves air well bilaterally, some expiratory wheezes  Cardiovascular: regular rate & rhythm,  no murmurs, gallops or rubs, no peripheral edema  Gastrointestinal: BS x4, soft NTND, liver and spleen not palpable, no ascites, abdominal scar RUQ s/p cholecystectomy, + reducible RUQ hernia  Rectal and genitalia: Not examined  Neurologic including mental status: Alert & oriented x3. No encephalopathy or asterixis  Dermatologic:no jaundice, rash, or spiders  Musculoskeletal: Normal tone        Laboratory:   Results for orders placed in visit on 03/16/13   COMPREHENSIVE METABOLIC PANEL, BLOOD       Result Value Range    Glucose 125 (*) 70 - 115 mg/dL    BUN 17  6 - 20 mg/dL    Creatinine 1.61 (*) 0.51 - 0.95 mg/dL    GFR 53      Sodium 096  136 - 145 mmol/L    Potassium 4.1  3.5 - 5.1 mmol/L    Chloride 103  98 - 107 mmol/L    Bicarbonate 26  22 - 29 mmol/L    Calcium  9.4  8.8 - 10.2 mg/dL    Total Protein 7.6  6.0 - 8.0 g/dL    Albumin  3.6  3.5 - 5.2 g/dL    Bilirubin, Tot 1.4 (*) <1.2 mg/dL    AST (SGOT) 33 (*) 0 - 32 U/L    ALT (SGPT) 32  0 - 33 U/L    Alkaline Phos 111  35 - 140 U/L   CBC WITH ADIFF, BLOOD       Result Value Range    WBC 3.1 (*) 4.0 - 10.0 1000/mm3    RBC 3.60 (*) 3.90 - 5.20 mill/mm3    Hgb 11.8  11.2 - 15.7 gm/dL    Hct 04.5 (*) 40.9 - 45.0 %    MCV 91.7  79.0 - 95.0 um3    MCH 32.8 (*) 26.0 - 32.0 pgm    MCHC 35.8   32.0 - 36.0 %    RDW 14.6 (*) 12.0 - 14.0 %    MPV 10.6  9.4 - 12.4 fL    Plt Count 42 (*) 140 - 370 1000/mm3    Segs 67  34 - 71 %    Lymphocytes 20  19 - 53 %    Monocytes 10  5 - 12 %    Eosinophils 3  1 - 7 %    Absolute Neutrophil Count 2.1  1.6 - 7.0  1000/mm3    Abs Lymphs 0.6 (*) 0.8 - 3.1 1000/mm3    Abs Monos 0.3  0.2 - 0.8 1000/mm3    Abs Eosinophils 0.1  0.0 - 0.5 1000/mm3    Diff Type Automated     PROTHROMBIN  TIME, BLOOD       Result Value Range    PT,Patient 15.5 (*) 9.7 - 12.5 sec    INR 1.5          Endoscopy:    Endoscopic Diagnosis: 12/17/11  1. Small distal esophageal varices.   2. Moderate portal hypertensive gastropathy.   3. Small hiatal hernia.   Recommendations: 1. Continue beta blocker   (Inderal /propranolol ).   2. Repeat EGD in 1-2 years for variceal surveillance.    Imaging:  MRI Abdomen 12/14/2012  1. 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    2. Minimal iron overload and additional chronic findings as on comparison.    3. 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   Over the years, there has been progressive enlargement of side branch   component, although no definite change since most recent MR.    4. No LI-RADS observations.      I have reviewed all lab documents in media and imaging and procedures in EPIC    Assessment and Plan: 72 yo hispanic female HCV GT 2 cirrhosis (achieved SVR)  fairly well compensated. 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    1. Cirrhosis: MELD 12, CPT A.     2. CHC GT 2: achieved SVR.    3. HCC surveillance: UTD. Repeat imaging 01/2013 with u/s.  - Will alternate with MRI to assess the IPMN  - repeat surveillance 06/2013    4. EV surveillance: small EV. Remote histroy of banding ~ 14 years ago. No h/o UGI bleeding  - repeat EGD ordered    5. Ventral hernia:   - abdominal  binder  - evaluated by surgeon and declined for repair  - advised her to call or go to the ER if she develops pain with nausea, vomiting or fever in case the hernia is strangulated    6. Fluid retention.   - well controlled with lasix  40 mg daily and aldactone  100 mg daily   - no ascites seen with recent MRI    7.  Diabetes: A1c 6.0. Managed by PCP  - on glipizide  10 mg twice daily  - on lantus     8. Pleural effusion: underlying emphysema  - continue with diurertics  - follow up with PCP  - advised if symptoms worsen (fever or increase SOB) to go to ED    9. Return to clinic: 3 months (labs prior to appointment)    Danna Duster, NP

## 2013-03-23 NOTE — Patient Instructions (Signed)
 1. Call Sonia to give list and dose of medications    2. Patient instructions for EGD    If you have any questions or concerns please do not hesitate to call your care team at [phone].     Thank you,    Darci East, MD  Associate Professor of Medicine  Director of Hepatology  [facility]    Danna Duster, NP  Hepatology Nurse Practitioner  [facility]    Theresa Flank, [place]  Hepatology Nurse: refill medications or medical questions  [facility]  D. Line [phone]    Wess Hammed  Administrative Assistant: appointments, procedures or insurance issues  [facility]  D. Line [phone]

## 2013-03-23 NOTE — Interdisciplinary (Signed)
 1420 Written and verbal instruction provided to patient, Soma Gentle  who verbalized  understanding, no apparent distress noted at time of discharge and pt will f/u with appointment as indicated in AVS.     Patient Education  Identified learning needs: Medication,uses,dose,side effects.  Learner: patient and family member   Barriers to learning: none  Readiness to learn: acceptance  Method: Explanation and handout.  Response: Verbalized understanding

## 2013-03-29 MED ORDER — RANITIDINE HCL 300 MG OR TABS: 300.00 mg | ORAL_TABLET | Freq: Two times a day (BID) | ORAL | Status: AC

## 2013-05-13 ENCOUNTER — Encounter (HOSPITAL_BASED_OUTPATIENT_CLINIC_OR_DEPARTMENT_OTHER): Payer: Self-pay | Admitting: Gastroenterology

## 2013-06-01 ENCOUNTER — Ambulatory Visit
Admission: RE | Admit: 2013-06-01 | Discharge: 2013-06-01 | Disposition: A | Discharge status: Home / Self Care | Payer: Medicare Other | Source: Ambulatory Visit | Attending: Gastroenterology | Admitting: Gastroenterology

## 2013-06-01 ENCOUNTER — Encounter (HOSPITAL_BASED_OUTPATIENT_CLINIC_OR_DEPARTMENT_OTHER): Admission: RE | Disposition: A | Discharge status: Home Health | Payer: Self-pay | Attending: Gastroenterology

## 2013-06-01 ENCOUNTER — Ambulatory Visit (HOSPITAL_BASED_OUTPATIENT_CLINIC_OR_DEPARTMENT_OTHER): Payer: Medicare Other | Admitting: Gastroenterology

## 2013-06-01 DIAGNOSIS — K766 Portal hypertension: Secondary | ICD-10-CM | POA: Insufficient documentation

## 2013-06-01 DIAGNOSIS — K7460 Unspecified cirrhosis of liver: Secondary | ICD-10-CM | POA: Insufficient documentation

## 2013-06-01 DIAGNOSIS — I8510 Secondary esophageal varices without bleeding: Secondary | ICD-10-CM | POA: Insufficient documentation

## 2013-06-01 DIAGNOSIS — K319 Disease of stomach and duodenum, unspecified: Secondary | ICD-10-CM | POA: Insufficient documentation

## 2013-06-01 DIAGNOSIS — B1920 Unspecified viral hepatitis C without hepatic coma: Secondary | ICD-10-CM | POA: Insufficient documentation

## 2013-06-01 HISTORY — PX: EGD PROCEDURE: GI43234

## 2013-06-01 LAB — GLUCOSE (POCT): Glucose (POCT): 145 mg/dL — ABNORMAL HIGH (ref 70–115)

## 2013-06-01 SURGERY — ESOPHAGOGASTRODUODENOSCOPY (EGD)
Anesthesia: Moderate Sedation - by non-anesthesia staff only

## 2013-06-01 MED ORDER — MIDAZOLAM HCL 5 MG/5ML IJ SOLN
INTRAMUSCULAR | Status: DC | PRN
Start: 2013-06-01 — End: 2013-06-01
  Administered 2013-06-01: 1 mg via INTRAVENOUS
  Administered 2013-06-01: 2 mg via INTRAVENOUS
  Administered 2013-06-01: 1 mg via INTRAVENOUS

## 2013-06-01 MED ORDER — FENTANYL CITRATE 0.05 MG/ML IJ SOLN
INTRAMUSCULAR | Status: DC | PRN
Start: 2013-06-01 — End: 2013-06-01
  Administered 2013-06-01 (×3): 25 ug via INTRAVENOUS

## 2013-06-01 NOTE — Discharge Instructions (Signed)
Given discharge instruction and endosoft report. Patient verbalized understanding

## 2013-06-01 NOTE — H&P (Signed)
 History and Physical    Indication for procedure:  72 yo F with HCV cirrhosis and history of small varices, EGD for varices screening.               Past Medical History   Diagnosis Date   . Migraine headache    . Glaucoma    . Ascites 06/29/2007     History of paracentesis x 2   . 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 Cirrhosis 06/24/2007     Active on the liver transplant waiting list with a blood type of O positive. 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 Osteopenia 06/24/2007   . Emphysema 06/24/2007   . EV (esophageal varices) 06/10/2007   . Anemia 06/10/2007   . HCV (hepatitis C virus) 06/10/2007     Achieved SVR.   [name] Type II or unspecified type diabetes mellitus with peripheral circulatory disorders, not stated as uncontrolled(250.70) 06/10/2007   . HTN 06/10/2007   . HTN 06/10/2007     Past Surgical History   Procedure Laterality Date   . Pb laps surg cholecstc w/expl common duct  1970s     in [place];  open CCX   . Pb cesarean delivery only     . Pb cstoplasty/cstourtp plstc any       Bladder suspension     Allergies   Allergen Reactions   . Advil [Ibuprofen Micronized] Other     Intolerance         Prior to Admission Medications   Outpatient Medications Last Dose Informant Patient Reported? Taking?   CALCIUM  CARBONATE 600 MG OR TABS   No No   Sig: 1 tablet 2 times daily   VITAMIN D  400 UNIT OR CAPS   No No   Sig: 1 CAPSULE DAILY   bacitracin  500 UNIT/GM ointment   No No   Sig: Apply small amount to affected area on face twice daily   benzonatate  (TESSALON ) 100 MG capsule   No No   Sig: Take 1 capsule by mouth 3 times daily as needed for Cough.   furosemide  (LASIX ) 20 MG tablet   No No   Sig: Take 2 tablets by mouth daily.   glipiZIDE  (GLUCOTROL ) 10 MG tablet   No No   Sig: Take 1 tablet by mouth 2 times daily.   guaiFENesin -codeine  (ROBITUSSIN AC) 100-10 MG/5ML oral solution   No No   Sig: Take 5 mLs by mouth every 6 hours as needed for Cough.   insulin  glargine (LANTUS ) 100 UNIT/ML  injection   No No   Sig: Inject 10 Units under the skin nightly.   multivitamin (MULTI-VITAMIN) tablet   Yes No   Sig: Take 1 tablet by mouth daily.   ondansetron  (ZOFRAN ) 4 MG tablet   No No   Sig: Take 1 tablet by mouth every 8 hours as needed for Nausea/Vomiting.   propranolol  (INDERAL ) 10 MG tablet   Yes No   Sig: Take 10 mg by mouth 2 times daily.   ranitidine  (ZANTAC ) 300 MG tablet   Yes No   Sig: Take 300 mg by mouth 2 times daily.   rifaximin  (XIFAXAN ) 550 MG tablet   Yes No   Sig: Take 550 mg by mouth every 12 hours.   spironolactone  (ALDACTONE ) 25 MG tablet   No No   Sig: Take 4 tablets by mouth daily.   traMADol  (ULTRAM ) 50 MG tablet   No No   Sig:  Take 1 tablet by mouth every 6 hours as needed for Moderate Pain (Pain Score 4-6).      Facility-Administered Medications: None       There were no vitals taken for this visit.  General:   Normal  Lungs:   Normal  CV:    Normal  Abdomen:   Mod ascites  2+ LE edema    ASA Score:  3   Airway (Mallimpati) Score:  Class II - Soft palate, uvula, and fauces are visible.    Assessment and Plan  Proceed to planned procedure.    The patient has consented to the procedure, which will be done with sedation.  I have assessed the patient's status immediately prior to this procedure.  I have discussed pain management needs and options for the patient with the patient or caregiver.      The patient agrees to be full code for the duration of the procedure.    Sedation options, risks, and plans have been discussed with the patient or caregiver.  Questions were answered.  The patient or caregiver agrees to proceed as planned.    Paula Manning Bertrand Brod

## 2013-06-01 NOTE — Procedures (Signed)
 Report Author:  Darci East, M.D.    Date of Operation:  06/01/2013    Endoscopist: Darci East    GI Fellow: None    Referring Physician:    PROCEDURE PERFORMED: EGD    INDICATIONS FOR EXAMINATION: 72 yo F with a history  of HCV cirrhosis and small varices on EGD 12/2011 here for  repeat EGD for varices screening.    INSTRUMENTS:    MEDICATIONS: Versed  4 mg IVP, Fentanyl  75 mcg IVP  NEED FOR ANESTHESIA:Moderate sedation    The attending physician, Dr.Emerie Vanderkolk Sharon December, was present for  the entire examination.    PROCEDURE TECHNIQUE: A evaluation was performed.  Informed consent was obtained from the patient after  explaining all the risks (perforation, bleeding, infection,  adverse effects to the medicine, missed lesion(s), and tooth  damage), benefits and alternatives to the procedure which  the patient appeared to understand and so stated.  The  patient was connected to the monitoring devices and  placed in the left lateral position. Continuous oxygen was  provided with a nasal cannula and IV medicine administered  through a indwelling cannula. After adequate conscious  sedation was achieved, the patient was intubated and the  scope advanced under direct visualization to extent of  exam.  The scope was subsequently removed slowly while  carefully examining the color, texture, anatomy, and integrity  of the mucosa on the way out. The patient was  subsequently transferred to the recovery area in satisfactory  condition.      COMPLICATIONS: None  ESTIMATED BLOOD LOSS: None  BIOPSY TAKEN: No  BOWEL PREP QUALITY:  EXTENT OF EXAM: second part of duodenum    Findings: 1. Midline orophyarngeal prominence at the  arytenoid  2. Three columnns of grade 1 esophageal varices, no high  risk features  3. No gastric varices, mild portal hypertensive gastropathy  4. Few punctate clots in the antrum  5. Mild duodenal congestion consistent with portal  hypertension    Endoscopic Diagnosis: 1. Midline orophyarngeal  prominence at  the arytenoid  2. Three columnns of grade 1 esophageal varices, no high  risk features  3. No gastric varices, mild portal hypertensive gastropathy  4. Few punctate clots in the antrum  5. Mild duodenal congestion consistent with portal  hypertension    Recommendations: 1. Continue propranolol  10 BID  2. ENT consultation to evaluate oropharyngeal findings  3. Repeat EGD in 1 year for varices screening            Electronically signed by:  Darci East, M.D. 06/15/2013 09:19 A          DD: 06/01/2013    DT:  06/01/2013 07:47 A  DocNo.:  [id number]  AK/bsm    Referring Physician:  SELF          cc:

## 2013-06-02 ENCOUNTER — Other Ambulatory Visit (HOSPITAL_BASED_OUTPATIENT_CLINIC_OR_DEPARTMENT_OTHER): Payer: Self-pay

## 2013-06-02 NOTE — Telephone Encounter (Signed)
 From: Jeyden Coffelt   Sent: Thursday, June 02, 2013 4:43 PM  To: Darci East MD; Danna Duster  Subject: RE: patient followup + Paula Manning    Hi Dr. Sharon December, I called and spoke with pt who reports she is 7 lbs past her baseline weight of 121 lbs.  She confirms lasix  40/spiro 100  Results for ERICHA, [place] (MRN [id number])    03/16/2013    Creatinine 1.03 (H)     Would you like to go to lasix  60/spiro 150 and repeat CMP next week?    Thanks,  Paula Manning    From: Darci East MD   Sent: Wednesday, June 01, 2013 8:42 AM  To: Danna Duster; Ardean Kohl  Subject: patient followup    Paula Manning, Paula Manning  MRN:   [id number]     Scoping her now. Noticed that her legs are a bit more edematous and slightly increased abdominal girth. May want to give her a call and get a sense of her daily weight trend. Perhaps need to increase diuretics.     Thanks.        Darci East, M.D.

## 2013-06-06 ENCOUNTER — Encounter (HOSPITAL_BASED_OUTPATIENT_CLINIC_OR_DEPARTMENT_OTHER): Payer: Self-pay | Admitting: Gastroenterology

## 2013-06-07 MED ORDER — FUROSEMIDE 20 MG OR TABS
60.0000 mg | ORAL_TABLET | Freq: Every day | ORAL | Status: DC
Start: 2013-06-07 — End: 2014-01-17

## 2013-06-07 MED ORDER — SPIRONOLACTONE 50 MG OR TABS
150.0000 mg | ORAL_TABLET | Freq: Every day | ORAL | Status: DC
Start: 2013-06-07 — End: 2013-07-12

## 2013-06-07 NOTE — Telephone Encounter (Signed)
 Pt came to [facility] clinic and with the assistance of Spanish translator relayed to the pt to increase to lasix  60/spiro 150 and have labs next week.  CMP pended.    Pt needs refills of lasix /spiro; pended.    From: Darci East MD   Sent: Thursday, June 02, 2013 4:47 PM  To: Ephram Kornegay  Cc: Danna Duster  Subject: Re: patient followup + Shareen Daub    Yes agree with plan. Thank you.   Darci East, M.D

## 2013-06-16 ENCOUNTER — Other Ambulatory Visit: Payer: Medicare Other | Attending: Family

## 2013-06-16 DIAGNOSIS — K7460 Unspecified cirrhosis of liver: Secondary | ICD-10-CM | POA: Insufficient documentation

## 2013-06-16 DIAGNOSIS — R188 Other ascites: Secondary | ICD-10-CM | POA: Insufficient documentation

## 2013-06-23 ENCOUNTER — Ambulatory Visit: Payer: Medicare Other | Attending: Family | Admitting: Family

## 2013-06-23 VITALS — BP 134/73 | HR 61 | Temp 97.9°F | Resp 16 | Ht 61.0 in | Wt 126.0 lb

## 2013-06-23 DIAGNOSIS — R188 Other ascites: Secondary | ICD-10-CM | POA: Insufficient documentation

## 2013-06-23 DIAGNOSIS — K7460 Unspecified cirrhosis of liver: Secondary | ICD-10-CM | POA: Insufficient documentation

## 2013-06-23 DIAGNOSIS — I8500 Esophageal varices without bleeding: Secondary | ICD-10-CM | POA: Insufficient documentation

## 2013-06-23 DIAGNOSIS — D490 Neoplasm of unspecified behavior of digestive system: Secondary | ICD-10-CM | POA: Insufficient documentation

## 2013-06-23 MED ORDER — PROPRANOLOL HCL 20 MG OR TABS
20.00 mg | ORAL_TABLET | Freq: Every day | ORAL | Status: DC
Start: 2013-06-23 — End: 2013-11-05

## 2013-07-12 ENCOUNTER — Other Ambulatory Visit (HOSPITAL_BASED_OUTPATIENT_CLINIC_OR_DEPARTMENT_OTHER): Payer: Self-pay

## 2013-07-12 NOTE — Telephone Encounter (Signed)
 72 year old female with HCV cirrhosis. She was treated for her HCV and achieved an SVR. Fluid retention is well controlled with lasix  60 mg daily and aldactone  150 mg daily.  Refill Req:  Spironolactone  50 mg tab. Take 3 tabs by mouth daily. Pended

## 2013-07-13 MED ORDER — SPIRONOLACTONE 50 MG OR TABS
150.0000 mg | ORAL_TABLET | Freq: Every day | ORAL | Status: DC
Start: 2013-07-12 — End: 2014-01-17

## 2013-07-19 ENCOUNTER — Ambulatory Visit (INDEPENDENT_AMBULATORY_CARE_PROVIDER_SITE_OTHER): Payer: 59 | Admitting: Head and Neck Surgery

## 2013-07-19 ENCOUNTER — Encounter (INDEPENDENT_AMBULATORY_CARE_PROVIDER_SITE_OTHER): Payer: Self-pay | Admitting: Head and Neck Surgery

## 2013-07-19 VITALS — BP 136/49 | HR 50 | Temp 97.2°F | Resp 20 | Ht 65.0 in | Wt 150.0 lb

## 2013-07-19 NOTE — Progress Notes (Signed)
[facility]     CHIEF COMPLAINT:  Laryngeal lesion    HISTORY OF PRESENT ILLNESS:  This is a 72 year old year old female who is referred by Dr. Lissa Merlin for evaluation of evaluation of a lesion noted during EGD for HCV cirrhosis.      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    Type II or unspecified type diabetes mellitus with peripheral circulatory disorders, not stated as uncontrolled(250.70) 06/10/2007    HTN 06/10/2007    HTN 06/10/2007       Past Surgical History   Procedure Laterality Date    Pb laps surg cholecstc w/expl common duct  1970s     in [place];  open CCX    Pb cesarean delivery only      Pb cstoplasty/cstourtp plstc any       Bladder suspension    Egd procedure  06/01/2013     Procedure: GI EGD;  Surgeon: Leone Payor, MD;  Location: [facility];  Service: Gastroenterology;;       Current Outpatient Prescriptions on File Prior to Visit   Medication Sig Dispense Refill    bacitracin 500 UNIT/GM ointment Apply small amount to affected area on face twice daily  1 Tube  1    benzonatate (TESSALON) 100 MG capsule Take 1 capsule by mouth 3 times daily as needed for Cough.  60 capsule  0    CALCIUM CARBONATE 600 MG OR TABS 1 tablet 2 times daily  one month  11    furosemide (LASIX) 20 MG tablet Take 3 tablets by mouth daily.  90 tablet  0    glipiZIDE (GLUCOTROL) 10 MG tablet Take 1 tablet by mouth 2 times daily.  60 tablet  3    guaiFENesin-codeine (ROBITUSSIN AC) 100-10 MG/5ML oral solution  Take 5 mLs by mouth every 6 hours as needed for Cough.  120 mL  0    insulin glargine (LANTUS) 100 UNIT/ML injection Inject 10 Units under the skin nightly.  1 Vial  2    multivitamin (MULTI-VITAMIN) tablet Take 1 tablet by mouth daily.        ondansetron (ZOFRAN) 4 MG tablet Take 1 tablet by mouth every 8 hours as needed for Nausea/Vomiting.  15 tablet  0    propranolol (INDERAL) 20 MG tablet Take 1 tablet by mouth daily.        ranitidine (ZANTAC) 300 MG tablet Take 300 mg by mouth 2 times daily.        rifaximin (XIFAXAN) 550 MG tablet Take 550 mg by mouth every 12 hours.        spironolactone (ALDACTONE) 50 MG tablet Take 3 tablets by mouth daily.  90 tablet  3    traMADol (ULTRAM) 50 MG tablet Take 1 tablet by mouth every 6 hours as needed for Moderate Pain (Pain Score  4-6).  30 tablet  0    VITAMIN D 400 UNIT OR CAPS 1 CAPSULE DAILY  30  11     No current facility-administered medications on file prior to visit.       Allergies   Allergen Reactions    Advil [Ibuprofen Micronized] Other     Intolerance           History     Social History    Marital Status: Married     Spouse Name: N/A     Number of Children: N/A    Years of Education: N/A     Occupational History    retired      Social History Main Topics    Smoking status: Former Smoker -- 10 years     Quit date: 11/13/2012    Smokeless tobacco: Never Used    Alcohol Use: No    Drug Use: No    Sexual Activity: Not on file     Other Topics Concern    Not on file     Social History Narrative    No narrative on file       ROS:    A 10 system review of systems was negative except as above    PHYSICAL EXAMINATION  BP 136/49   Pulse 50   Temp(Src) 97.2 F (36.2 C) (Oral)   Resp 20   Ht 5\' 5"  (1.651 m)   Wt 68.04 kg (150 lb)   BMI 24.96 kg/m2  VOICE:  normal  GENERAL:  No apparent distress.  Normal affect.  EARS:  Right:  External auditory canal patent.  Tympanic membrane intact.  Middle ear aerated.  Left:  External auditory canal patent.   Tympanic membrane intact.  Middle ear aerated.  NOSE: clear anteriorly.  Nasal mucosa healthy.  Septum midline.  ORAL CAVITY: no masses or ulcerations.  OROPHARYNX: no masses or ulcerations.  Palate elevates midline.  NECK: No masses.  No cervical adenopathy.  No thyroid masses.  NEURO:  CN's VII, and XII intact and symmetrical.    PROCEDURE:  Flexible Laryngoscopy:    Flexible laryngoscopy was performed.  After anesthetization and verbal consent, an endoscope was inserted through the right nasal pasage.  The nasopharynx, base of tongue, pyriform sinus, and post-cricoid region were free of masses, lesions, and ulcerations. There is a large prominence of the posterior pharyngeal wall at the level of the arytenoids.  The vocal folds adduct and abduct normally and symmetrically.  There is a patent glottic airway.  There is no edema and no erythema noted.  Endolaryngeal examination reveals no masses or lesions.      RESULTS:  CT spine 2013 reviewed.    QUESTIONNAIRES:  Voice Handicap Index = dnc  Reflux Symptom Index = dnc  Eating Assessment Tool 10 = dnc  Voice Related Quality of Life Short = dnc    ASSESMENT &  PLAN:  (1). Diffuse idiopathic skeletal hyperostosis / osteophyte    The large pharyngeal prominence represents a large osteophyte.  It does not seem to impose much in the way of a swallowing issue for her.  She should follow up with me if she notes a change in swallow function.    Thank you for the referral of this patient to the [facility].  Should you have any questions about the care of this patient or any other, please call us at [phone].

## 2013-07-22 ENCOUNTER — Ambulatory Visit
Admission: RE | Admit: 2013-07-22 | Discharge: 2013-07-22 | Disposition: A | Discharge status: Home / Self Care | Payer: 59 | Source: Ambulatory Visit | Attending: Diagnostic Radiology | Admitting: Diagnostic Radiology

## 2013-07-22 DIAGNOSIS — R188 Other ascites: Secondary | ICD-10-CM | POA: Insufficient documentation

## 2013-07-22 DIAGNOSIS — K862 Cyst of pancreas: Secondary | ICD-10-CM | POA: Insufficient documentation

## 2013-07-22 DIAGNOSIS — K766 Portal hypertension: Secondary | ICD-10-CM | POA: Insufficient documentation

## 2013-07-22 DIAGNOSIS — K7460 Unspecified cirrhosis of liver: Secondary | ICD-10-CM | POA: Insufficient documentation

## 2013-07-22 DIAGNOSIS — K863 Pseudocyst of pancreas: Secondary | ICD-10-CM | POA: Insufficient documentation

## 2013-09-02 ENCOUNTER — Ambulatory Visit (INDEPENDENT_AMBULATORY_CARE_PROVIDER_SITE_OTHER): Payer: Medicare Other | Admitting: Ophthalmology

## 2013-09-02 ENCOUNTER — Encounter (INDEPENDENT_AMBULATORY_CARE_PROVIDER_SITE_OTHER): Payer: 59

## 2013-09-16 ENCOUNTER — Other Ambulatory Visit: Payer: 59 | Attending: Family

## 2013-09-16 DIAGNOSIS — Z9189 Other specified personal risk factors, not elsewhere classified: Secondary | ICD-10-CM

## 2013-09-16 DIAGNOSIS — K7460 Unspecified cirrhosis of liver: Principal | ICD-10-CM | POA: Insufficient documentation

## 2013-09-16 LAB — CBC WITH DIFF, BLOOD
ANC-Automated: 2 10*3/uL (ref 1.6–7.0)
Abs Eosinophils: 0.1 10*3/uL (ref 0.1–0.5)
Abs Lymphs: 0.6 10*3/uL — ABNORMAL LOW (ref 0.8–3.1)
Abs Monos: 0.3 10*3/uL (ref 0.2–0.8)
Eosinophils: 3 % (ref 1–4)
Hct: 34.6 % (ref 34.0–45.0)
Hgb: 12.4 gm/dL (ref 11.2–15.7)
Lymphocytes: 19 % (ref 19–53)
MCH: 32.9 pg — ABNORMAL HIGH (ref 26.0–32.0)
MCHC: 35.8 % (ref 32.0–36.0)
MCV: 91.8 um3 (ref 79.0–95.0)
MPV: 10.9 fL (ref 9.4–12.4)
Monocytes: 10 % (ref 5–12)
Plt Count: 44 10*3/uL — ABNORMAL LOW (ref 140–370)
Plt Est: DECREASED
RBC: 3.77 10*6/uL — ABNORMAL LOW (ref 3.90–5.20)
RDW: 14 % (ref 12.0–14.0)
Segs: 67 % (ref 34–71)
WBC: 3 10*3/uL — ABNORMAL LOW (ref 4.0–10.0)

## 2013-09-16 LAB — COMPREHENSIVE METABOLIC PANEL, BLOOD
ALT (SGPT): 31 U/L (ref 0–33)
AST (SGOT): 32 U/L (ref 0–32)
Albumin: 3.5 g/dL (ref 3.5–5.2)
Alkaline Phos: 136 U/L (ref 35–140)
BUN: 18 mg/dL (ref 6–20)
Bicarbonate: 30 mmol/L — ABNORMAL HIGH (ref 22–29)
Bilirubin, Tot: 1.46 mg/dL — ABNORMAL HIGH (ref <1.20)
Calcium: 9.2 mg/dL (ref 8.5–10.6)
Chloride: 102 mmol/L (ref 98–107)
Creatinine: 0.92 mg/dL (ref 0.51–0.95)
GFR: 60 mL/min
Glucose: 145 mg/dL — ABNORMAL HIGH (ref 70–115)
Potassium: 3.9 mmol/L (ref 3.5–5.1)
Sodium: 137 mmol/L (ref 136–145)
Total Protein: 7.5 g/dL (ref 6.0–8.0)

## 2013-09-16 LAB — PROTHROMBIN TIME, BLOOD
INR: 1.4
PT,Patient: 15.2 s — ABNORMAL HIGH (ref 9.7–12.5)

## 2013-09-19 LAB — DES-GAMMA-CARBOXY PROTHROMBIN, BLOOD: Des-Gamma-Carboxy Prothrombin: 3.3 ng/mL (ref 0.0–7.4)

## 2013-09-19 LAB — ALPHA FETOPROTEIN TOTAL & L3 PERCENT
AFP L3%: <0.5 % (ref 0.0–9.9)
AFP Total: 1 ng/mL (ref 0–15)

## 2013-09-23 ENCOUNTER — Ambulatory Visit: Payer: 59 | Attending: Family | Admitting: Family

## 2013-09-23 VITALS — BP 123/73 | HR 52 | Temp 97.4°F | Resp 16 | Ht 65.0 in | Wt 116.0 lb

## 2013-09-23 DIAGNOSIS — I8500 Esophageal varices without bleeding: Secondary | ICD-10-CM | POA: Insufficient documentation

## 2013-09-23 DIAGNOSIS — K7460 Unspecified cirrhosis of liver: Secondary | ICD-10-CM | POA: Insufficient documentation

## 2013-09-23 DIAGNOSIS — D490 Neoplasm of unspecified behavior of digestive system: Principal | ICD-10-CM | POA: Insufficient documentation

## 2013-09-23 DIAGNOSIS — Z9189 Other specified personal risk factors, not elsewhere classified: Secondary | ICD-10-CM

## 2013-09-23 NOTE — Patient Instructions (Signed)
1. Blood work before next appointment    2. Follow up with PCP regarding her weight loss    3. Please have her son call to review the medications with our nurse Gae Bon    As a reminder, please do not go to the lab without confirming lab orders are in the computer for you. If you have any questions you may call the lab at [phone].    If you have any questions or concerns please do not hesitate to call your care team at [phone].     Thank you,    Jeanette Caprice, MD  Associate Professor of Medicine  Director of [place], NP  Hepatology Nurse Practitioner  [place]    [facility] [place], [place]  Hepatology Nurse: refill medications or medical questions  [place]  D. [address] [place]  Administrative Assistant: appointments, procedures or insurance issues  [place]  D. Line [phone]

## 2013-09-23 NOTE — Progress Notes (Signed)
September 23, 2013    Location: [facility]     Hepatologist: Jeanette Caprice, MD    Visit Provider: Orlean Patten, NP     Chief Complaint   Patient presents with    Cirrhosis        Reason for visit: management of HCV cirrhosis and portal hypertension        HPI:  Paula Manning is a 73 year old female with HCV cirrhosis. She was treated for her HCV and achieved an SVR. She was last seen in this clinic Jul 08, 2013. In the interim she has not required any emergency care or hospitalizations.     She reports continued balance issues secondary to lower extremity weakness. She was evaluated by neurology and diagnosed with lumabr radiculopathy. She was advised she needed surgery but has refused as she does not feel she is strong enough to tolerate procedure. She is being cautious to avoid falls and uses a walker. Her family assists her with mobility.     She is here by herself and is a poor historian with her medications. She has lost 34 lbs since 07-08-2013. Her son died and she is very sad and not eating well. She is being followed by her PCP.         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    At risk for cancer: [facility] 06/23/2013    Acute dyspnea 12/15/2012    CAP (community acquired pneumonia) 11/30/2012    Hypoxia 11/30/2012    Nasal bone fx-closed 10/17/2011    Syncope 10/17/2011    Status post thoracentesis 12/28/2010    Pleural effusion associated with hepatic disorder 12/27/2010    Fracture of proximal humerus 08/14/2009    Hypertensive disorder 12/12/2008    Hyperlipidemia 12/12/2008    Microalbuminuria 12/12/2008    Osteoporosis 07/26/2007    Ascites 06/29/2007     History of paracentesis x 2      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  Blood type of O positive.  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  Type II or unspecified type diabetes mellitus with peripheral circulatory disorders, not stated as uncontrolled(250.70) 06/10/2007    HTN 06/10/2007    HTN 06/10/2007       Allergies   Allergen Reactions    Advil [Ibuprofen Micronized] Other     Intolerance           Current Outpatient Prescriptions on File Prior to Visit   Medication Sig Dispense Refill    bacitracin 500 UNIT/GM ointment Apply small amount to affected area on face twice daily  1 Tube  1    benzonatate (TESSALON) 100 MG capsule Take 1 capsule by mouth 3 times daily as needed for Cough.  60 capsule  0    CALCIUM CARBONATE 600 MG OR TABS 1 tablet 2 times daily  one month  11    furosemide (LASIX) 20 MG tablet Take 3 tablets by mouth daily.  90 tablet  0    glipiZIDE (GLUCOTROL) 10 MG tablet Take 1 tablet by mouth 2 times daily.  60 tablet  3    guaiFENesin-codeine (ROBITUSSIN AC) 100-10 MG/5ML oral solution Take 5 mLs by mouth every 6 hours as needed for Cough.  120 mL  0    insulin glargine (LANTUS) 100 UNIT/ML injection Inject 10  Units under the skin nightly.  1 Vial  2    multivitamin (MULTI-VITAMIN) tablet Take 1 tablet by mouth daily.        ondansetron (ZOFRAN) 4 MG tablet Take 1 tablet by mouth every 8 hours as needed for Nausea/Vomiting.  15 tablet  0    propranolol (INDERAL) 20 MG tablet Take 1 tablet by mouth daily.        ranitidine (ZANTAC) 300 MG tablet Take 300 mg by mouth 2 times daily.        rifaximin (XIFAXAN) 550 MG tablet Take 550 mg by mouth every 12 hours.        spironolactone (ALDACTONE) 50 MG tablet Take 3 tablets by mouth daily.  90 tablet  3    traMADol (ULTRAM) 50 MG tablet Take 1 tablet by mouth every 6 hours as needed for Moderate Pain (Pain Score 4-6).  30 tablet  0    VITAMIN D 400 UNIT OR CAPS 1 CAPSULE DAILY  30  11     No current facility-administered medications on file prior to visit.       History     Social History    Marital Status: Married     Spouse Name: N/A     Number of Children: N/A    Years of Education: N/A     Occupational History    retired      Social History Main Topics    Smoking status: Former Smoker -- 10 years     Quit date: 11/13/2012    Smokeless tobacco: Never Used    Alcohol Use: No    Drug Use: No    Sexual Activity: Not on file     Other Topics Concern    Not on file     Social History Narrative    No narrative on file        No family history on file.     Past Surgical History   Procedure Laterality Date    Pb laps surg cholecstc w/expl common duct  1970s     in [place];  open CCX    Pb cesarean delivery only      Pb cstoplasty/cstourtp plstc any  Bladder suspension    Egd procedure  06/01/2013     Procedure: GI EGD;  Surgeon: Jeanette Caprice, MD;  Location: [facility];  Service: Gastroenterology;;           Review of Systems -  Constitutional: no fever or chills, + weight loss  Eyes: no vision changes  HENT: no headaches, nose bleeds or oral lesions  Cardiovascular: no chest pain or peripheral edema  Respiratory: no SOB, dyspnea or  hemoptysis  Gastrointestinal: no  nausea, vomiting, or hematemesis. No abdominal distention, + abdominal hernia  Genitourinary: no dysuria  Musculoskeletal: no pain or swelling of muscles or joints  Integumentary: no rashes or jaundice  Neurological: no tremors or difficulties with memory  Psychiatric: Negative  Endocrine: no cold/heat intolerance, no excessive sweating or polyuria  Hematologic/Lymphatic: no easy bruising or bleeding  Allergic/Immunologic: negative, see allergy list        Physical Exam: (9 systems examined)     Vital Signs: BP 123/73    Pulse 52    Temp(Src) 97.4 F (36.3 C) (Oral)    Resp 16    Ht 5\' 5"  (1.651 m)    Wt 52.617 kg (116 lb)    BMI 19.30 kg/m2      SpO2 100%      BMI: Body mass index is 19.3 kg/(m^2).     Systems:  GEN: appears well, in no apparent distress. Alert and oriented times three, pleasant and cooperative.  HEENT: no sclera icterus, O/P: mmm no lesions  Lymph nodes:  negative lymph nodes  Pulmonary: moves air well bilaterally  Cardiovascular: regular rate & rhythm,  no murmurs, gallops or rubs, no peripheral edema  Gastrointestinal: BS x4, soft NTND, liver and spleen not palpable, no ascites, abdominal scar RUQ s/p cholecystectomy, + reducible RUQ hernia  Rectal and genitalia: Not examined  Neurologic including mental status: Alert & oriented x3. No encephalopathy or asterixis  Dermatologic:no jaundice, rash, or spiders  Musculoskeletal: Normal tone        Laboratory:   Results for orders placed in visit on 09/16/13   COMPREHENSIVE METABOLIC PANEL, BLOOD       Result Value Ref Range    Glucose 145 (*) 70 - 115 mg/dL    BUN 18  6 - 20 mg/dL    Creatinine 0.92  0.51 - 0.95 mg/dL    GFR 60      Sodium 137  136 - 145 mmol/L    Potassium 3.9  3.5 - 5.1 mmol/L    Chloride 102  98 - 107 mmol/L    Bicarbonate 30 (*) 22 - 29 mmol/L    Calcium 9.2  8.5 - 10.6 mg/dL    Total Protein 7.5  6.0 - 8.0 g/dL    Albumin 3.5  3.5 - 5.2 g/dL    Bilirubin, Tot 1.46 (*) <1.20 mg/dL    AST (SGOT)  32  0 - 32 U/L    ALT (SGPT) 31  0 - 33 U/L    Alkaline Phos 136  35 - 140 U/L   CBC WITH ADIFF, BLOOD       Result Value Ref Range    WBC 3.0 (*) 4.0 - 10.0 1000/mm3    RBC 3.77 (*) 3.90 - 5.20 mill/mm3    Hgb 12.4  11.2 - 15.7 gm/dL    Hct 34.6  34.0 - 45.0 %    MCV 91.8  79.0 - 95.0 um3    MCH 32.9 (*) 26.0 - 32.0 pgm  MCHC 35.8  32.0 - 36.0 %    RDW 14.0  12.0 - 14.0 %    MPV 10.9  9.4 - 12.4 fL    Plt Count 44 (*) 140 - 370 1000/mm3    Segs 67  34 - 71 %    Lymphocytes 19  19 - 53 %    Monocytes 10  5 - 12 %    Eosinophils 3  <1 - 4 %    ANC-Automated 2.0  1.6 - 7.0 1000/mm3    Abs Lymphs 0.6 (*) 0.8 - 3.1 1000/mm3    Abs Monos 0.3  0.2 - 0.8 1000/mm3    Abs Eosinophils 0.1  <0.1 - 0.5 1000/mm3    Diff Type Automated      Plt Est Decreased      Poikilocytosis 1+      Burr Cell 1+     PROTHROMBIN TIME, BLOOD       Result Value Ref Range    PT,Patient 15.2 (*) 9.7 - 12.5 sec    INR 1.4     ALPHA FETOPROTEIN TOTAL & L3 PERCENT       Result Value Ref Range    AFP Total 1  0 - 15 ng/mL    AFP L3% < 0.5  0.0 - 9.9 %   DES-GAMMA-CARBOXY PROTHROMBIN, BLOOD       Result Value Ref Range    Des-Gamma-Carboxy Prothrombin 3.3  0.0 - 7.4 ng/mL        Endoscopy:   Endoscopic Diagnosis: 05/2013  1. Midline orophyarngeal   prominence at the arytenoid   2. Three columnns of grade 1 esophageal varices, no high   risk features   3. No gastric varices, mild portal hypertensive gastropathy   4. Few punctate clots in the antrum   5. 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 Continue propranolol 10 BID   2. ENT consultation to evaluate oropharyngeal findings   3. Repeat EGD in 1 year for varices screening     Imaging:  MRI Abdomen 07/2013  Please note that there is significant motion artifact which limits the   sensitivity and specificity of this examination.    1. Observation assessment:  LIRADS NEGATIVE - no reportable LIRADS observations    2. Radiologic T stage deferred.    3.  Radiologic recommendation: As per hepatology.    4. Stable thrombus of the portal confluence.    5. Portal hypertension characterized by cirrhosis, moderate ascites, and   splenomegaly.    6. 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   Attention on followup.    7. Stable anterior abdominal wall subcutaneous fluid collection. Attention on   followup.        I have reviewed all lab documents in media and imaging and procedures in EPIC    Assessment and Plan: 73 yo hispanic female HCV GT 2 cirrhosis (achieved SVR)  fairly well compensated. EUS and several CT scans since 2005 are consistent with branch-type IPMN.    1) Cirrhosis: MELD 11; CPT A-6    2) CHC GT 2: achieved SVR    3) HCC surrveillance:   - no HCC; stable IPMN  - repeat surveillance 01/2014    4) EV surveillance: small EV. Remote historyy of banding ~ 14 years ago. No h/o UGI bleeding  - Grade I EV. Continue with Propranolol 20 mg daily. HR 52  - prominence at the arytenoid seen with EGD.  Referral pending with ENT 07/19/2013    5) Ventral hernia:   - abdominal binder  - evaluated by surgeon and declined for repair  - advised her to call or go to the ER if she develops pain with nausea, vomiting or fever in case the hernia is strangulated    6) Fluid retention.   - well controlled with lasix 60 mg daily and aldactone 150 mg daily   - significant weight loss. Would recommend dose reducing diuretics. Need son to call to confirm dose of diuretics    7) Diabetes: A1c 6.0.(11/2012) Managed by PCP  - on glipizide 10 mg twice daily  - on lantus    8) H/o Pleural effusion: underlying emphysema  - continue with diurertics  - follow up with PCP  - advised if symptoms worsen (fever or increase SOB) to go to ED    9)  Nutrition:  - 2,000 low sodium diet  - spanish handouts given. Recommend Mrs Deliah Boston salt free for flavoring    10) Midline orophyarngeal prominence at the arytenoid (seen on EGD)  - evaluated by ENT 07/19/2013: large pharyngeal  prominence represents a large osteophyte. No swallowing issues. Follow up with ENT prn    11) Return to clinic: 3 months (labs prior to appointment)    Orlean Patten, NP

## 2013-10-20 NOTE — Progress Notes (Signed)
This encounter was opened in error.  Please disregard.

## 2013-11-01 ENCOUNTER — Inpatient Hospital Stay (HOSPITAL_COMMUNITY): Payer: 59 | Admitting: Internal Medicine

## 2013-11-01 ENCOUNTER — Inpatient Hospital Stay
Admission: EM | Admit: 2013-11-01 | Discharge: 2013-11-05 | DRG: 872 | Disposition: A | Discharge status: Home / Self Care | Payer: 59 | Attending: Internal Medicine | Admitting: Internal Medicine

## 2013-11-01 DIAGNOSIS — E1165 Type 2 diabetes mellitus with hyperglycemia: Secondary | ICD-10-CM

## 2013-11-01 DIAGNOSIS — K644 Residual hemorrhoidal skin tags: Secondary | ICD-10-CM | POA: Diagnosis present

## 2013-11-01 DIAGNOSIS — R509 Fever, unspecified: Secondary | ICD-10-CM

## 2013-11-01 DIAGNOSIS — M949 Disorder of cartilage, unspecified: Secondary | ICD-10-CM

## 2013-11-01 DIAGNOSIS — Z86718 Personal history of other venous thrombosis and embolism: Secondary | ICD-10-CM

## 2013-11-01 DIAGNOSIS — K219 Gastro-esophageal reflux disease without esophagitis: Secondary | ICD-10-CM | POA: Diagnosis present

## 2013-11-01 DIAGNOSIS — I10 Essential (primary) hypertension: Secondary | ICD-10-CM | POA: Diagnosis present

## 2013-11-01 DIAGNOSIS — E1151 Type 2 diabetes mellitus with diabetic peripheral angiopathy without gangrene: Secondary | ICD-10-CM

## 2013-11-01 DIAGNOSIS — R188 Other ascites: Secondary | ICD-10-CM | POA: Diagnosis present

## 2013-11-01 DIAGNOSIS — B1920 Unspecified viral hepatitis C without hepatic coma: Secondary | ICD-10-CM | POA: Diagnosis present

## 2013-11-01 DIAGNOSIS — R7881 Bacteremia: Principal | ICD-10-CM | POA: Diagnosis present

## 2013-11-01 DIAGNOSIS — M19019 Primary osteoarthritis, unspecified shoulder: Secondary | ICD-10-CM | POA: Diagnosis present

## 2013-11-01 DIAGNOSIS — R262 Difficulty in walking, not elsewhere classified: Secondary | ICD-10-CM

## 2013-11-01 DIAGNOSIS — D649 Anemia, unspecified: Secondary | ICD-10-CM | POA: Diagnosis present

## 2013-11-01 DIAGNOSIS — IMO0001 Reserved for inherently not codable concepts without codable children: Secondary | ICD-10-CM | POA: Diagnosis present

## 2013-11-01 DIAGNOSIS — A491 Streptococcal infection, unspecified site: Secondary | ICD-10-CM

## 2013-11-01 DIAGNOSIS — Z87891 Personal history of nicotine dependence: Secondary | ICD-10-CM

## 2013-11-01 DIAGNOSIS — K7460 Unspecified cirrhosis of liver: Secondary | ICD-10-CM | POA: Diagnosis present

## 2013-11-01 DIAGNOSIS — I8510 Secondary esophageal varices without bleeding: Secondary | ICD-10-CM | POA: Diagnosis present

## 2013-11-01 DIAGNOSIS — B951 Streptococcus, group B, as the cause of diseases classified elsewhere: Secondary | ICD-10-CM | POA: Diagnosis present

## 2013-11-01 DIAGNOSIS — M47814 Spondylosis without myelopathy or radiculopathy, thoracic region: Secondary | ICD-10-CM | POA: Diagnosis present

## 2013-11-01 DIAGNOSIS — K766 Portal hypertension: Secondary | ICD-10-CM | POA: Diagnosis present

## 2013-11-01 DIAGNOSIS — M899 Disorder of bone, unspecified: Secondary | ICD-10-CM | POA: Diagnosis present

## 2013-11-01 DIAGNOSIS — Z800 Family history of malignant neoplasm of digestive organs: Secondary | ICD-10-CM

## 2013-11-01 DIAGNOSIS — K8689 Other specified diseases of pancreas: Secondary | ICD-10-CM | POA: Diagnosis present

## 2013-11-01 DIAGNOSIS — E871 Hypo-osmolality and hyponatremia: Secondary | ICD-10-CM | POA: Diagnosis present

## 2013-11-01 DIAGNOSIS — K439 Ventral hernia without obstruction or gangrene: Secondary | ICD-10-CM | POA: Diagnosis present

## 2013-11-01 DIAGNOSIS — A0472 Enterocolitis due to Clostridium difficile, not specified as recurrent: Secondary | ICD-10-CM | POA: Diagnosis present

## 2013-11-01 DIAGNOSIS — I868 Varicose veins of other specified sites: Secondary | ICD-10-CM | POA: Diagnosis present

## 2013-11-01 DIAGNOSIS — J438 Other emphysema: Secondary | ICD-10-CM | POA: Diagnosis present

## 2013-11-01 DIAGNOSIS — H409 Unspecified glaucoma: Secondary | ICD-10-CM | POA: Diagnosis present

## 2013-11-01 LAB — COMPREHENSIVE METABOLIC PANEL, BLOOD
ALT (SGPT): 31 U/L (ref 0–33)
AST (SGOT): 36 U/L — ABNORMAL HIGH (ref 0–32)
Albumin: 3.2 g/dL — ABNORMAL LOW (ref 3.5–5.2)
Alkaline Phos: 143 U/L — ABNORMAL HIGH (ref 35–140)
Anion Gap: 8 mmol/L (ref 7–15)
BUN: 18 mg/dL (ref 6–20)
Bicarbonate: 26 mmol/L (ref 22–29)
Bilirubin, Tot: 1.85 mg/dL — ABNORMAL HIGH (ref <1.20)
Calcium: 8.4 mg/dL — ABNORMAL LOW (ref 8.5–10.6)
Chloride: 98 mmol/L (ref 98–107)
Creatinine: 1.01 mg/dL — ABNORMAL HIGH (ref 0.51–0.95)
GFR: 54 mL/min
Glucose: 307 mg/dL — ABNORMAL HIGH (ref 70–115)
Potassium: 3.8 mmol/L (ref 3.5–5.1)
Sodium: 132 mmol/L — ABNORMAL LOW (ref 136–145)
Total Protein: 7.3 g/dL (ref 6.0–8.0)

## 2013-11-01 LAB — VENOUS BLOOD GAS
BE, Ven: 3.2 mmol/L — ABNORMAL HIGH (ref <=2.3)
FIO2, Ven: 21 %
HCO3, Ven: 28 mmol/L (ref 23–28)
Temp, Ven: 39.5 'C
pCO2, Ven (T): 46 mmHg (ref 40–52)
pCO2, Ven (Uncorr): 41 mmHg (ref 40–52)
pH, Ven (T): 7.41 — ABNORMAL HIGH (ref 7.33–7.40)
pH, Ven (Uncorr): 7.44 — ABNORMAL HIGH (ref 7.33–7.40)
pO2, Ven (T): 29 mmHg (ref 25–44)
pO2, Ven (Uncorr): 24 mmHg — ABNORMAL LOW (ref 25–44)

## 2013-11-01 LAB — LACTATE, BLOOD: Lactate: 1.3 mmol/L (ref 0.5–2.2)

## 2013-11-01 LAB — AMMONIA, PLASMA: Ammonia: 33 umol/L (ref 16–60)

## 2013-11-01 LAB — URINALYSIS
Bilirubin: NEGATIVE
Ketones: NEGATIVE
Leuk Esterase: NEGATIVE
Nitrite: NEGATIVE
Specific Gravity: 1.012 (ref 1.002–1.030)
pH: 7 (ref 5.0–8.0)

## 2013-11-01 LAB — CBC WITH DIFF, BLOOD
ANC-Automated: 6.2 10*3/uL (ref 1.6–7.0)
Abs Lymphs: 0.3 10*3/uL — ABNORMAL LOW (ref 0.8–3.1)
Abs Monos: 0.3 10*3/uL (ref 0.2–0.8)
Hct: 31.5 % — ABNORMAL LOW (ref 34.0–45.0)
Hgb: 11.6 gm/dL (ref 11.2–15.7)
Lymphocytes: 4 % — ABNORMAL LOW (ref 19–53)
MCH: 33.8 pg — ABNORMAL HIGH (ref 26.0–32.0)
MCHC: 36.8 % — ABNORMAL HIGH (ref 32.0–36.0)
MCV: 91.8 um3 (ref 79.0–95.0)
MPV: 10.3 fL (ref 9.4–12.4)
Monocytes: 4 % — ABNORMAL LOW (ref 5–12)
Plt Count: 39 10*3/uL — ABNORMAL LOW (ref 140–370)
Plt Est: DECREASED
RBC: 3.43 10*6/uL — ABNORMAL LOW (ref 3.90–5.20)
RDW: 14.2 % — ABNORMAL HIGH (ref 12.0–14.0)
Segs: 91 % — ABNORMAL HIGH (ref 34–71)
WBC: 6.8 10*3/uL (ref 4.0–10.0)

## 2013-11-01 LAB — PHOSPHORUS, BLOOD: Phosphorous: 1.8 mg/dL — ABNORMAL LOW (ref 2.7–4.5)

## 2013-11-01 LAB — PROTHROMBIN TIME, BLOOD
INR: 1.5
PT,Patient: 16.1 s — ABNORMAL HIGH (ref 9.7–12.5)

## 2013-11-01 LAB — APTT, BLOOD: PTT: 21.4 s — ABNORMAL LOW (ref 25.0–34.0)

## 2013-11-01 LAB — MAGNESIUM, BLOOD: Magnesium: 1.6 mg/dL — ABNORMAL LOW (ref 1.7–2.6)

## 2013-11-01 MED ORDER — LIDOCAINE-EPINEPHRINE 1 %-1:100000 IJ SOLN
5.0000 mL | Freq: Once | INTRAMUSCULAR | Status: AC
Start: 2013-11-01 — End: 2013-11-01
  Administered 2013-11-01: 5 mL via INTRADERMAL
  Filled 2013-11-01: qty 20

## 2013-11-01 MED ORDER — MAGNESIUM SULFATE 40 MG/ML IJ SOLN
2.0000 g | Freq: Once | INTRAMUSCULAR | Status: AC
Start: 2013-11-01 — End: 2013-11-01
  Administered 2013-11-01 (×2): 2 g via INTRAVENOUS
  Filled 2013-11-01: qty 50

## 2013-11-01 MED ORDER — VANCOMYCIN HCL 1000 MG IV SOLR
1000.0000 mg | Freq: Once | INTRAVENOUS | Status: AC
Start: 2013-11-01 — End: 2013-11-02
  Administered 2013-11-01 (×2): 1000 mg via INTRAVENOUS
  Filled 2013-11-01: qty 1000

## 2013-11-01 MED ORDER — ACETAMINOPHEN 325 MG PO TABS
650.0000 mg | ORAL_TABLET | Freq: Once | ORAL | Status: AC
Start: 2013-11-01 — End: 2013-11-01
  Administered 2013-11-01: 650 mg via ORAL
  Filled 2013-11-01: qty 2

## 2013-11-01 MED ORDER — SODIUM CHLORIDE 0.9 % IV SOLN
Freq: Once | INTRAVENOUS | Status: AC
Start: 2013-11-01 — End: 2013-11-01
  Administered 2013-11-01: 21:00:00 via INTRAVENOUS

## 2013-11-01 MED ORDER — ONDANSETRON HCL 4 MG/2ML IV SOLN
4.0000 mg | Freq: Four times a day (QID) | INTRAMUSCULAR | Status: DC | PRN
Start: 2013-11-01 — End: 2013-11-02

## 2013-11-01 MED ORDER — SODIUM CHLORIDE 0.9 % IV SOLN
3375.0000 mg | Freq: Once | INTRAVENOUS | Status: AC
Start: 2013-11-01 — End: 2013-11-01
  Administered 2013-11-01: 3375 mg via INTRAVENOUS
  Filled 2013-11-01: qty 3375

## 2013-11-01 NOTE — ED Provider Notes (Signed)
Emergency Department Note    The Date of Service for the Emergency Room encounter is 11/01/2013  7:30 PM     CC :   Chief Complaint   Patient presents with    Abdominal Pain     pt bibm c/o worsening generalized abd pain starting aprox 11a associated c diarrhea. Per medic report pt temp 103.1. hx of liver failure, hep C, abd hernia. denies n/v    Fever        HPI :   73 y/o Female with history of HepC, Cirrhosis with grade 1 esophageal varices (on endoscopy 2014), HTN, DM, who presents with AMS, fever, and diarrhea x 1 day.  She had difficulty getting up from the toilet earlier today due to weakness.  Had temperature measured at home that was elevated to 103, then proceeded to have multiple episodes of diarrhea.  She is unable to give a history, is AOx1-2, fluctuating.  She was complaining of abdominal pain yesterday, but not today.  Her children at bedside say she may have been having a cough, but no urinary complaints or headache.  No falls.  Unsure if she takes lactulose, is not on her med list.  Of note, was in [place] yesterday and ate street hot dog.  No recent abx, no international travel.      Past Medical History:   Past Medical History   Diagnosis Date    Migraine headache     Glaucoma     Ascites 06/29/2007     History of paracentesis x 2    Pancreatic cyst 06/29/2007    Cirrhosis 06/24/2007     Active on the liver transplant waiting list with a blood type of O positive. 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    Type II or unspecified type diabetes mellitus with peripheral circulatory disorders, not stated as uncontrolled(250.70) 06/10/2007    HTN 06/10/2007    HTN 06/10/2007       Past Surgical history:   Past Surgical History   Procedure Laterality Date    Pb laps surg cholecstc w/expl common duct  1970s     in  [place];  open CCX    Pb cesarean delivery only      Pb cstoplasty/cstourtp plstc any       Bladder suspension    Egd procedure  06/01/2013     Procedure: GI EGD;  Surgeon: Jeanette Caprice, MD;  Location: [facility];  Service: Gastroenterology;;       Allergies:   Advil    Social History:  Tobacco: no  Etoh: no  Illicit, IV, rx drug abuse: no  Living situation: has place to live    Family history:  None    Review of Systems:  All other systems reviewed and deemed negative      Physical Exam:    Initial ED vitals   BP: 156/55   Pulse: 80   Temp: 103.1 F (39.5 C)   Resp: 18   SpO2: 98%     General: AOx1-2; NAD  HEENT: PERRL; EOMI  Heart: RRR; normal S1/S2; no r/m/g  Lungs: CTAB  Abd:    No gross masses, hematomas, abrasions seen   Soft, non-distended   Normoactive BS present   No tenderness to  palpation in all quadrants   Small amount of ascites on u/s  Rectal:   Heme negative; no gross blood on glove   No internal masses palpated   No anal fissures or external hemorrhoids noted  Back: No CVAT  MSK: full ROM in all extremities  Neuro: no asterixis  Ext: no swelling in extremities  Skin: no rash    Pertinent Test Results:  - CBC: chronic anemia and thrombocytopenia  - CMP: significant for glucose-307 elevated (normal anio gap); LFTs chronically elevated  - Mg 1.6 slightly low  - EKG: see ED interpretation note  - VBG: normal pH and pCO2  - Ammonia 33 wnl  - UA: moderate blood; NEG nitrite / LE / bili / ketones  - CXR - 11/01/2013 - IMPRESSION: No acute disease.        ED course / Clinical Decision Making:  73 year old female with history of hep C cirrhosis with grade 1 esophageal varices (on scope 2014), HTN, DM, who presents with AMS, fever, and diarrhea x 1 day.  She is encephalopathic (fluctuates between answering questions and being much more confused) with temperature 103 here.  She is not tachycardic (but on beta-blocker), is not toxic-appearing.  She is normotensive.  Her source for fever from the  history seems more GI related, possibly gastroenteritis.  She could have SBP although she does not have a peritoneal exam.  There is a small pocket of fluid, but there are adhesions seen on u/s and therefore, do not believe the benefits outweight the risks of doing a paracentesis.  Cultures sent and pt covered for broad-spectrum abx.    She has no falls and she is PERRL with no signs of trauma on head, therefore do not believe AMS is 2/2 intracranial bleed.  DDx for AMS includes endocrine/electrolyte abnormality (i.e. Hepatic encephalopathy) and medication-induced as well.  Will get AMS and infectious w/u.  Anticipate admission due to fever, AMS, with concern for SBP.    Merdis Delay, MD  11/01/13 [phone]

## 2013-11-01 NOTE — ED Notes (Signed)
Bed: 06  Expected date:   Expected time:   Means of arrival:   Comments:  SEPSIS

## 2013-11-01 NOTE — ED EKG Interpretation (Signed)
ED EKG Interpretation  Sinus @ 81bpm, normal axis, PR interval 165ms, QRS duration 59ms, QTc=491ms; No ST elevations/depressions or TWI

## 2013-11-01 NOTE — ED Floor Report (Signed)
ED to [facility] is a 73 year old female    Chief Complaint   Patient presents with    Abdominal Pain     pt bibm c/o worsening generalized abd pain starting aprox 11a associated c diarrhea. Per medic report pt temp 103.1. hx of liver failure, hep C, abd hernia. denies n/v    Fever       Admitted for: fever    Code Status:  Please refer to In-pt admitting doctors orders     Past Medical History   Diagnosis Date    Migraine headache     Glaucoma     Ascites 06/29/2007     History of paracentesis x 2    Pancreatic cyst 06/29/2007    Cirrhosis 06/24/2007     Active on the liver transplant waiting list with a blood type of O positive. 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    Type II or unspecified type diabetes mellitus with peripheral circulatory disorders, not stated as uncontrolled(250.70) 06/10/2007    HTN 06/10/2007    HTN 06/10/2007       Past Surgical History   Procedure Laterality Date    Pb laps surg cholecstc w/expl common duct  1970s     in [place];  open CCX    Pb cesarean delivery only      Pb cstoplasty/cstourtp plstc any       Bladder suspension    Egd procedure  06/01/2013     Procedure: GI EGD;  Surgeon: Jeanette Caprice, MD;  Location: [facility];  Service: Gastroenterology;;       Allergies: Advil    Level of Care : IMU    ED Fall Risk: Yes    Skin issues:  no    >> If yes, note areas of skin breakdown. See appropriate photos.      Ambulatory:  no    Sitter needed: no    Suicide Risk :  no    Isolation Required: unknown     >> If yes , what type of isolation:    Is patient in custody?  no    Is patient in restraints? no    Is patient on Heparin? no If yes, complete below:   Bolus=    Units      Units/hr   @   Mls/hour      Time of next PT/PTT draw:          Brief Summary of  ED Visit (to include focused assessment and neuro status):  Pt is 73 year old female with history of hep C cirrhosis with grade 1 esophageal varices (on scope 2014), HTN, DM, who presents with AMS, fever, and diarrhea x 1 day. She had difficulty getting up from the toilet earlier today due to weakness. Had temperature measured at home that was elevated to 103, then proceeded to have multiple episodes of diarrhea. Upon arrival pt AOx1-2, bu fluctuating. After starting abx and given tylenol, fever & mentation improving. Pt now more alert/following commands & answering questions appropriately.         See  RN SHIFT ASSESSMENT ADULT  for full assessment, exceptions will be listed.    Cardiac rhythm:  NSR @ 84  Oxygen Delivery: None    Filed Vitals:    11/01/13 1943 11/01/13 2119 11/01/13 2211   BP: 156/55 132/52 130/58   Pulse: 80 84 74   Temp: 103.1 F (39.5 C) 103.5 F (39.7 C) 102.4 F (39.1 C)   Resp: 18 18 18    SpO2: 98% 98% 94%       Lab Results   Component Value Date    WBC 6.8 11/01/2013    RBC 3.43* 11/01/2013    HGB 11.6 11/01/2013    HCT 31.5* 11/01/2013    MCV 91.8 11/01/2013    MCHC 36.8* 11/01/2013    RDW 14.2* 11/01/2013    PLT 39* 11/01/2013    MPV 10.3 11/01/2013       Lab Results   Component Value Date    NA 132* 11/01/2013    K 3.8 11/01/2013    CL 98 11/01/2013    BICARB 26 11/01/2013    BUN 18 11/01/2013    CREAT 1.01* 11/01/2013    GLU 307* 11/01/2013    [place] 8.4* 11/01/2013       Lab Results   Component Value Date    PHOS 1.8* 11/01/2013    MG 1.6* 11/01/2013    LACTATE 1.3 11/01/2013    AMMONIA 33 11/01/2013       No results found for this basename: CPK, CKMBH, TROPONIN       Lab Results   Component Value Date    PH 7.41* 11/01/2013    PCO2 46 11/01/2013    UNPH 7.44* 11/01/2013    UNPCO2 41 11/01/2013       No results found for this visit on 11/01/13.    RADIOLOGIC STUDIES NOT COMPLETED: none  (none unless otherwise noted)    Active PICC Line / CVC Line / PIV Line / Drain / Airway / Intraosseous Line / Epidural Line  / ART Line / Line Type / Wound    Name: Placement date: Placement time: Site: Days:    Peripheral IV - 20 G Left Antecubital     Antecubital      Peripheral IV - 20 G Right Hand 11/01/13  2011  Hand  less than 1    Indwelling Urinary Catheter -  11/01/13 2117 RN Temperature probe 16 fr 10 ml Yes 11/01/13  2117  Temperature probe  less than 1            Any significant events and interventions with responses:  none      Floor nurse informed that report is ready for review.  Opportunity to answer questions with floor RN face to face or by phone. ER number is [id number] . ER RN Ginger Carne to be contacted for any questions.    Has this report been updated?  no  By Who:   Time:   Additional information by updated user:

## 2013-11-01 NOTE — ED Notes (Signed)
Assumed care of pt  pt bibm c/o worsening generalized abd pain starting aprox 11a associated c diarrhea. Per medic report pt temp 103.1. hx of liver failure, hep C, abd hernia. denies n/v. Pt A&O to name & type of facility only, poor concentration/difficulty following commands, spanish speaking only, breathing even/nonlabored, skins warm/pink/dry, abd distended/tender to palp/firm. abd pain 2/10  Placed on full monitor NSR 80, O2 sat 98% on RA  Choi MD at bedside for eval

## 2013-11-01 NOTE — ED Notes (Signed)
Urine sample obtained and sent to the lab for UA & culture

## 2013-11-01 NOTE — ED Notes (Signed)
Choi MD at bedside c u/s

## 2013-11-01 NOTE — ED Notes (Signed)
Blood culture # 1 drawn from  LAC    Sample labeled and sent to the lab.

## 2013-11-01 NOTE — ED Notes (Signed)
Blood culture #2 drawn from R hand    Sample labeled and sent to the lab.

## 2013-11-01 NOTE — ED Notes (Signed)
Blood samples collected/labeled and sent to the lab.

## 2013-11-01 NOTE — ED Notes (Signed)
Choi MD at bedside for paracentesis

## 2013-11-01 NOTE — ED Notes (Signed)
Assisting Primary RN, pt to radiology via gurney.

## 2013-11-01 NOTE — H&P (Addendum)
Medicine History & Physical    CC:  Diarrhea, Fever    History of Present Illness:     Paula Manning is a 73 year old Spanish-speaking female with a history of cirrhosis, h/o Hep C with SVR, HTN, T2DM presenting with 1 day of high fevers and 5-6 episode of large volume watery, non-bloody diarrhea starting today at noon. Family members measured her fever at home which was noted to be over 101 F degrees, so they decided to bring her in to the ED. Yesterday, patient traveled to [facility] where she ate a hotdog from a street vendor.  Two weeks ago she traveled to [facility] to visit her sister where she endorses eating some unusual foods, such as carne asada and different meats and cheeses.     For the last week and a half, the patient has been also feeling more fatigued than usual and noticing her legs swelling more as well. Per daughter at bedside, the patient's husband found her in the bathroom late yesterday confused and disoriented as to where she was located. This episode resolved itself and she has been alert and oriented since per the family.     Patient endorses taking her medications daily including rifaximin and diuretics and following up with doctors. She does not actively drink alcohol or use any drugs.     Endorses some acid reflux like pain in the last few days, nausea but not vomiting.  Denies BRBPR, melena or hematemesis. Denies chills, weight loss, syncope, dizziness, URI symptoms, CP, palpitations, SOB, or cough.    Past Medical and Surgical History:  Past Medical History   Diagnosis Date    Migraine headache     Glaucoma     Ascites 06/29/2007     History of paracentesis x 2    Pancreatic cyst 06/29/2007    Cirrhosis 06/24/2007     Active on the liver transplant waiting list with a blood type of O positive. 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 Due to Hep C    Osteopenia 06/24/2007    Emphysema 06/24/2007    EV (esophageal varices) 06/10/2007    Anemia 06/10/2007    HCV  (hepatitis C virus) 06/10/2007     Achieved SVR.    Type II or unspecified type diabetes mellitus with peripheral circulatory disorders, not stated as uncontrolled(250.70) 06/10/2007    HTN 06/10/2007    HTN 06/10/2007     Past Surgical History   Procedure Laterality Date    Pb laps surg cholecstc w/expl common duct  1970s     in [place];  open CCX    Pb cesarean delivery only      Pb cstoplasty/cstourtp plstc any       Bladder suspension    Egd procedure  06/01/2013     Procedure: GI EGD;  Surgeon: Jeanette Caprice, MD;  Location: [facility];  Service: Gastroenterology;;       Allergies:  Allergies   Allergen Reactions    Advil [Ibuprofen Micronized] Other     Intolerance           Medications:  No current facility-administered medications on file prior to encounter.     Current Outpatient Prescriptions on File Prior to Encounter   Medication Sig Dispense Refill    bacitracin 500 UNIT/GM ointment Apply small amount to affected area on face twice daily  1 Tube  1    benzonatate (TESSALON) 100 MG capsule Take 1 capsule by mouth 3 times daily as  needed for Cough.  60 capsule  0    CALCIUM CARBONATE 600 MG OR TABS 1 tablet 2 times daily  one month  11    furosemide (LASIX) 20 MG tablet Take 3 tablets by mouth daily.  90 tablet  0    glipiZIDE (GLUCOTROL) 10 MG tablet Take 1 tablet by mouth 2 times daily.  60 tablet  3    guaiFENesin-codeine (ROBITUSSIN AC) 100-10 MG/5ML oral solution Take 5 mLs by mouth every 6 hours as needed for Cough.  120 mL  0    insulin glargine (LANTUS) 100 UNIT/ML injection Inject 10 Units under the skin nightly.  1 Vial  2    multivitamin (MULTI-VITAMIN) tablet Take 1 tablet by mouth daily.        ondansetron (ZOFRAN) 4 MG tablet Take 1 tablet by mouth every 8 hours as needed for Nausea/Vomiting.  15 tablet  0    propranolol (INDERAL) 20 MG tablet Take 1 tablet by mouth daily.        ranitidine (ZANTAC) 300 MG tablet Take 300 mg by mouth 2 times daily.        rifaximin  (XIFAXAN) 550 MG tablet Take 550 mg by mouth every 12 hours.        spironolactone (ALDACTONE) 50 MG tablet Take 3 tablets by mouth daily.  90 tablet  3    traMADol (ULTRAM) 50 MG tablet Take 1 tablet by mouth every 6 hours as needed for Moderate Pain (Pain Score 4-6).  30 tablet  0    VITAMIN D 400 UNIT OR CAPS 1 CAPSULE DAILY  30  11       Social History:  History     Social History    Marital Status: Married     Spouse Name: N/A     Number of Children: N/A    Years of Education: N/A     Occupational History    retired      Social History Main Topics    Smoking status: Former Smoker -- 10 years     Quit date: 11/13/2012    Smokeless tobacco: Never Used    Alcohol Use: No    Drug Use: No    Sexual Activity: Not on file     Other Topics Concern    Not on file     Social History Narrative    No narrative on file   Lives at home in [place] with husband. Daughter lives nearby.    Family History:  Sister-died of pancreatic cancer  No h/o HTN, DM, liver disease, or cancer.  ----------------------------------------------------------------------------------------------    Physical Exam:  BP 130/58   Pulse 74   Temp(Src) 102.4 F (39.1 C)   Resp 18   SpO2 94%  General:  NAD, oriented and answering questions appropriately with appropriate eye contact. A&Ox4  Eyes: anicteric  OP: clear, dry MM  Neck: supple, no cervical LAD  Cardiovascular:  Regular rate and rhythm without murmurs, rubs or gallops.  Lungs:  Clear to auscultation bilaterally, no wheezing, rales, or rhonchi  Abdomen:  Bowel sounds present, soft, non-tender. No abdominal distention or ascites present  Extremities:  WWP, 1+ pitting edema to mid-shins present with chronic stasis changes present on right medial malleolar surfaces  Skin:  no visible rash, scattered spider angiomas present on abdomen    Labs:     Recent Labs      11/01/13   2014   WBC  6.8   HGB  11.6   HCT  31.5*   MCV  91.8   PLT  39*   SEG  91*   LYMPHS  4*   MONOS  4*         Recent Labs      11/01/13   2022   NA  132*   K  3.8   CL  98   BICARB  26   BUN  18   CREAT  1.01*   [place]  8.4*   MG  1.6*   PHOS  1.8*       Recent Labs      11/01/13   2022   TP  7.3   ALB  3.2*   TBILI  1.85*   AST  36*   ALT  31   ALK  143*       Recent Labs      11/01/13   2014   INR  1.5   PTT  21.4*   Ammonia 33  Lactate 1.3    Lab Results   Component Value Date    COLORUA Yellow 11/01/2013    APPEARUA Clear 11/01/2013    GLUCOSEUA Trace* 11/01/2013    BILIUA Negative 11/01/2013    KETONEUA Negative 11/01/2013    SGUA 1.012 11/01/2013    BLOODUA Moderate* 11/01/2013    PHUA 7.0 11/01/2013    PROTEINUA 2+* 11/01/2013    UROBILUA 0.2-1 11/01/2013    NITRITEUA Negative 11/01/2013    LEUKESTUA Negative 11/01/2013    WBCUA 0-2 11/01/2013    RBCUA 21-50* 11/01/2013    HYALINEUA 3-5* 11/01/2013    CRYSTALSUA AMORPHOUS 12/14/2002     C. Diff Toxin pending  WBC Smear pending  Stool Culture pending  Urine Culture pending    Interval Imaging / Procedures / Studies:   X-ray Chest Frontal And Lateral    11/01/2013    EXAM DESCRIPTION: CHEST 2 VIEWS FRONTAL & LATERAL  CLINICAL HISTORY: Fever of unknown origin.  COMPARISON: 12/13/2012  FINDINGS: No pleural effusion or pneumothorax demonstrated.  No consolidation.  Unremarkable cardiomediastinal silhouette.  No acute osseous abnormality identified. There is re- demonstration of a  chronic fracture deformity of the left humeral neck. Moderate bilateral  acromioclavicular joint osteoarthrosis. Mild to moderate bilateral  glenohumeral joint osteoarthrosis. Mild multilevel thoracic spondylosis.  IMPRESSION: No acute disease.  _______________________________________________  Assessment and Care Plan:  Paula Manning is a 73 year old Spanish-speaking female with a history of cirrhosis, h/o Hep C with SVR, HTN, T2DM presenting with 1 day of high fevers and 5-6 episode of large volume watery, non-bloody diarrhea with possible food source for AGE, in context of multiple electrolyte disturbance  and uncontrolled diabetes.     #Acute Diarrhea with Fever: Ddx: AGE 82/2 foodborne illness, SBP, acute GIB, liver abscess, C. Diff. Patient likely had toxin containing food in [place] 2 weeks ago. SBP less likely with no abdominal tenderness on exam, no leukocytosis and no sizeable ascites noted on U/S in ED. Liver abscess possibility given diarrhea, high fever, but less likely with acute presentation. UA LE negative, nitrites negative. No e/o overt bleeding.  -admit to obs  -continue IVF  -f/u stool culture, WBC smear  -f/u C. Diff toxin  -f/u urine culture  -hold off on abx at this time    #LLE edema>RLE edema: 1 week onset, patient adherent to diuretic regimen. Cirrhosis can be a pro-thrombotic state.  -BLE ultrasound to r/o DVT    #Hypomagnesmia/Hypophosphatemic- likely 2/2 GI  losses  -BMP daily  -replete prn    #Hyponatremia-likely hypovolemic 2/2 extrarenal GI losses. Intravascularly depleted, urine hyaline 3-5. Previous [Na] normal 1 month ago  -IVF  -trend Na daily  -defer sending urine lytes at this time until fully volume resuscitated    #T2DM-uncontrolled, glucose 307 on panel today. 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 Followed by [facility], Dr. Andreas Ohm.   [place] GT 2: achieved SVR  HCC surveillance: MRI pending as outpatient  EV surveillance: small EV, remote history of banding >10 years ago. No h/o UGIB: continue propanolol 34m daily  Fluid retention: Continue daily po lasix 621m aldactone 15055maily  HE: no signs on exam today. Continue rifaximin    #HTN-propanolol    #GERD-famotidine    #DVT Ppx- SQH    Stool: n/a  Telemetry: n/a  Analgesia: Tylenol  Feeding: Carb limited  Foley: None, discontinued in ED  Glucose: QAC+HS  Oxygen: protocol  Ambulation: PT/OT ordered  Lines: None  Social barriers: Safe dc anticipated back to home after volume resuscitated and patient  deferversces                                           #---   Fc/Fc ---#    Staffed with Dr. SasNancie NeasD  PGY1, Internal Medicine  [phone]   ATTENDING HISTORY AND PHYSICAL ATTESTATION    I agree with the elements of the resident H&P specifically the HPI, PMH, ROS, Social HX, FamilyHX, allergies, meds, assessment and plan with the following additions or exceptions.  I personally performed the service or was physically present during the critical or key portions of the service furnished by the resident.  We discussed these elements at time of admission      Subjective    Chief complaint: fever    History of present illness: [age] woman w h/o HCV cirrhosis and portal hypertension presents to ED w 1 day diarrhea and high fever. no source id'd. rcv'd vanc, zosyn in ED. did not continue, no tappable ascites. stool studies sent.      See resident history and physical for further details of the patient's history.    Objective    I have examined the patient and concur with the resident exam.    Assessment and Plan    I agree with the resident care plan.    See the resident and physical for further details.

## 2013-11-01 NOTE — ED Notes (Signed)
Admit MD a bedside for eval

## 2013-11-02 DIAGNOSIS — I81 Portal vein thrombosis: Secondary | ICD-10-CM

## 2013-11-02 DIAGNOSIS — R509 Fever, unspecified: Secondary | ICD-10-CM

## 2013-11-02 DIAGNOSIS — R188 Other ascites: Secondary | ICD-10-CM

## 2013-11-02 DIAGNOSIS — M7989 Other specified soft tissue disorders: Secondary | ICD-10-CM

## 2013-11-02 LAB — CBC WITH DIFF, BLOOD
ANC-Automated: 6.5 10*3/uL (ref 1.6–7.0)
Abs Lymphs: 0.5 10*3/uL — ABNORMAL LOW (ref 0.8–3.1)
Abs Monos: 0.6 10*3/uL (ref 0.2–0.8)
Hct: 29.2 % — ABNORMAL LOW (ref 34.0–45.0)
Hgb: 10.7 gm/dL — ABNORMAL LOW (ref 11.2–15.7)
Lymphocytes: 7 % — ABNORMAL LOW (ref 19–53)
MCH: 34 pg — ABNORMAL HIGH (ref 26.0–32.0)
MCHC: 36.6 % — ABNORMAL HIGH (ref 32.0–36.0)
MCV: 92.7 um3 (ref 79.0–95.0)
MPV: 11.3 fL (ref 9.4–12.4)
Monocytes: 8 % (ref 5–12)
Plt Count: 33 10*3/uL — ABNORMAL LOW (ref 140–370)
RBC: 3.15 10*6/uL — ABNORMAL LOW (ref 3.90–5.20)
RDW: 14.3 % — ABNORMAL HIGH (ref 12.0–14.0)
Segs: 85 % — ABNORMAL HIGH (ref 34–71)
WBC: 7.7 10*3/uL (ref 4.0–10.0)

## 2013-11-02 LAB — GLUCOSE (POCT)
Glucose (POCT): 221 mg/dL — ABNORMAL HIGH (ref 70–115)
Glucose (POCT): 206 mg/dL — ABNORMAL HIGH (ref 70–115)
Glucose (POCT): 249 mg/dL — ABNORMAL HIGH (ref 70–115)
Glucose (POCT): 173 mg/dL — ABNORMAL HIGH (ref 70–115)
Glucose (POCT): 227 mg/dL — ABNORMAL HIGH (ref 70–115)

## 2013-11-02 LAB — ECG 12-LEAD
ATRIAL RATE: 81 {beats}/min
P AXIS: 84 degrees
PR INTERVAL: 158 ms
QRS INTERVAL/DURATION: 78 ms
QT: 406 ms
QTC INTERVAL: 471 ms
R AXIS: 75 degrees
T AXIS: 59 degrees
VENTRICULAR RATE: 81 {beats}/min

## 2013-11-02 LAB — BASIC METABOLIC PANEL, BLOOD
Anion Gap: 10 mmol/L (ref 7–15)
BUN: 20 mg/dL (ref 6–20)
Bicarbonate: 22 mmol/L (ref 22–29)
Calcium: 8.5 mg/dL (ref 8.5–10.6)
Chloride: 102 mmol/L (ref 98–107)
Creatinine: 0.96 mg/dL — ABNORMAL HIGH (ref 0.51–0.95)
GFR: 57 mL/min
Glucose: 170 mg/dL — ABNORMAL HIGH (ref 70–115)
Potassium: 4 mmol/L (ref 3.5–5.1)
Sodium: 134 mmol/L — ABNORMAL LOW (ref 136–145)

## 2013-11-02 LAB — PHOSPHORUS, BLOOD: Phosphorous: 3.6 mg/dL (ref 2.7–4.5)

## 2013-11-02 LAB — MAGNESIUM, BLOOD: Magnesium: 2.8 mg/dL — ABNORMAL HIGH (ref 1.7–2.6)

## 2013-11-02 LAB — GLYCOSYLATED HGB(A1C), BLOOD: Glyco Hgb (A1C): 7.4 % — ABNORMAL HIGH (ref 4.8–5.9)

## 2013-11-02 LAB — SMEAR, WBC

## 2013-11-02 MED ORDER — GLUCAGON HCL (RDNA) 1 MG IJ SOLR
1.0000 mg | Freq: Once | INTRAMUSCULAR | Status: DC | PRN
Start: 2013-11-02 — End: 2013-11-05

## 2013-11-02 MED ORDER — SODIUM CHLORIDE 0.9 % IV SOLN
2000.0000 mg | INTRAVENOUS | Status: DC
Start: 2013-11-02 — End: 2013-11-05
  Administered 2013-11-02 – 2013-11-04 (×3): 2000 mg via INTRAVENOUS
  Filled 2013-11-02 (×3): qty 2000

## 2013-11-02 MED ORDER — VANCOMYCIN HCL 1000 MG IV SOLR
1000.0000 mg | Freq: Once | INTRAVENOUS | Status: AC
Start: 2013-11-02 — End: 2013-11-02
  Administered 2013-11-02: 1000 mg via INTRAVENOUS
  Filled 2013-11-02: qty 1000

## 2013-11-02 MED ORDER — SODIUM CHLORIDE 0.9 % IV SOLN
INTRAVENOUS | Status: DC | PRN
Start: 2013-11-02 — End: 2013-11-05

## 2013-11-02 MED ORDER — ACETAMINOPHEN 325 MG PO TABS
650.0000 mg | ORAL_TABLET | ORAL | Status: DC | PRN
Start: 2013-11-02 — End: 2013-11-05
  Administered 2013-11-02: 650 mg via ORAL
  Filled 2013-11-02: qty 2

## 2013-11-02 MED ORDER — NALOXONE HCL 0.4 MG/ML IJ SOLN
0.1000 mg | INTRAMUSCULAR | Status: DC | PRN
Start: 2013-11-02 — End: 2013-11-05

## 2013-11-02 MED ORDER — GLUCOSE 4 GM PO CHEW (CUSTOM)
4.0000 | CHEWABLE_TABLET | ORAL | Status: DC | PRN
Start: 2013-11-02 — End: 2013-11-05

## 2013-11-02 MED ORDER — VANCOMYCIN HCL 750 MG IV SOLR
750.0000 mg | Freq: Two times a day (BID) | INTRAVENOUS | Status: DC
Start: 2013-11-02 — End: 2013-11-02

## 2013-11-02 MED ORDER — PROPRANOLOL HCL 10 MG OR TABS
20.0000 mg | ORAL_TABLET | Freq: Every day | ORAL | Status: DC
Start: 2013-11-02 — End: 2013-11-02
  Administered 2013-11-02: 20 mg via ORAL
  Filled 2013-11-02: qty 2

## 2013-11-02 MED ORDER — INSULIN LISPRO (HUMAN) 100 UNIT/ML SC SOLN (CUSTOM)
1.0000 [IU] | Freq: Four times a day (QID) | INTRAMUSCULAR | Status: DC
Start: 2013-11-02 — End: 2013-11-02
  Administered 2013-11-02 (×2): 2 [IU] via SUBCUTANEOUS
  Filled 2013-11-02 (×2): qty 2

## 2013-11-02 MED ORDER — VITAMIN D 400 UNIT OR TABS
400.0000 [IU] | ORAL_TABLET | Freq: Every day | ORAL | Status: DC
Start: 2013-11-02 — End: 2013-11-05
  Administered 2013-11-02 – 2013-11-05 (×4): 400 [IU] via ORAL
  Filled 2013-11-02 (×4): qty 1

## 2013-11-02 MED ORDER — MAGNESIUM SULFATE 40 MG/ML IJ SOLN
2.0000 g | Freq: Once | INTRAMUSCULAR | Status: AC
Start: 2013-11-02 — End: 2013-11-02
  Administered 2013-11-02: 2 g via INTRAVENOUS
  Filled 2013-11-02: qty 50

## 2013-11-02 MED ORDER — BACITRACIN 500 UNIT/GM EX OINT (CUSTOM)
TOPICAL_OINTMENT | Freq: Two times a day (BID) | CUTANEOUS | Status: DC
Start: 2013-11-02 — End: 2013-11-02
  Filled 2013-11-02: qty 15

## 2013-11-02 MED ORDER — K PHOS DI & MONO-SOD PHOS MONO 155-852-130 MG OR TABS
500.0000 mg | ORAL_TABLET | Freq: Once | ORAL | Status: AC
Start: 2013-11-02 — End: 2013-11-02
  Administered 2013-11-02: 2 via ORAL
  Filled 2013-11-02: qty 2

## 2013-11-02 MED ORDER — SODIUM CHLORIDE 0.9 % IJ SOLN (CUSTOM)
3.0000 mL | Freq: Three times a day (TID) | INTRAMUSCULAR | Status: DC
Start: 2013-11-02 — End: 2013-11-05
  Administered 2013-11-02 – 2013-11-05 (×8): 3 mL via INTRAVENOUS

## 2013-11-02 MED ORDER — RIFAXIMIN 550 MG PO TABS
550.0000 mg | ORAL_TABLET | Freq: Two times a day (BID) | ORAL | Status: DC
Start: 2013-11-02 — End: 2013-11-05
  Administered 2013-11-02 – 2013-11-05 (×7): 550 mg via ORAL
  Filled 2013-11-02 (×7): qty 1

## 2013-11-02 MED ORDER — DEXTROSE (DIABETIC USE) 40 % OR GEL
1.0000 | ORAL | Status: DC | PRN
Start: 2013-11-02 — End: 2013-11-05

## 2013-11-02 MED ORDER — SPIRONOLACTONE 25 MG OR TABS
150.0000 mg | ORAL_TABLET | Freq: Every day | ORAL | Status: DC
Start: 2013-11-02 — End: 2013-11-05
  Administered 2013-11-02 – 2013-11-05 (×4): 150 mg via ORAL
  Filled 2013-11-02 (×4): qty 1

## 2013-11-02 MED ORDER — MULTI-VITAMINS PO TABS
1.0000 | ORAL_TABLET | Freq: Every day | ORAL | Status: DC
Start: 2013-11-02 — End: 2013-11-05
  Administered 2013-11-02 – 2013-11-05 (×5): 1 via ORAL
  Filled 2013-11-02 (×4): qty 1

## 2013-11-02 MED ORDER — INSULIN GLARGINE 100 UNIT/ML SC SOLN
10.0000 [IU] | Freq: Every evening | SUBCUTANEOUS | Status: DC
Start: 2013-11-02 — End: 2013-11-05
  Administered 2013-11-02 – 2013-11-04 (×4): 10 [IU] via SUBCUTANEOUS
  Filled 2013-11-02 (×4): qty 10

## 2013-11-02 MED ORDER — HEPARIN SODIUM (PORCINE) PF 5000 UNIT/0.5ML IJ SOLN
5000.0000 [IU] | Freq: Three times a day (TID) | INTRAMUSCULAR | Status: DC
Start: 2013-11-02 — End: 2013-11-02
  Administered 2013-11-02: 5000 [IU] via SUBCUTANEOUS
  Filled 2013-11-02: qty 0.5

## 2013-11-02 MED ORDER — INSULIN LISPRO (HUMAN) 100 UNIT/ML SC SOLN (CUSTOM)
1.0000 [IU] | Freq: Four times a day (QID) | INTRAMUSCULAR | Status: DC
Start: 2013-11-02 — End: 2013-11-05
  Administered 2013-11-02 (×2): 1 [IU] via SUBCUTANEOUS
  Administered 2013-11-03: 12:00:00 2 [IU] via SUBCUTANEOUS
  Administered 2013-11-03: 18:00:00 4 [IU] via SUBCUTANEOUS
  Administered 2013-11-03: 21:00:00 1 [IU] via SUBCUTANEOUS
  Administered 2013-11-04: 5 [IU] via SUBCUTANEOUS
  Administered 2013-11-04 (×2): 4 [IU] via SUBCUTANEOUS
  Administered 2013-11-05: 08:00:00 3 [IU] via SUBCUTANEOUS
  Filled 2013-11-02: qty 4
  Filled 2013-11-02: qty 1
  Filled 2013-11-02: qty 2
  Filled 2013-11-02: qty 5
  Filled 2013-11-02: qty 4
  Filled 2013-11-02 (×2): qty 1
  Filled 2013-11-02: qty 4
  Filled 2013-11-02: qty 3

## 2013-11-02 MED ORDER — SODIUM CHLORIDE 0.9 % IJ SOLN (CUSTOM)
3.0000 mL | INTRAMUSCULAR | Status: DC | PRN
Start: 2013-11-02 — End: 2013-11-05

## 2013-11-02 MED ORDER — FAMOTIDINE 20 MG OR TABS
20.0000 mg | ORAL_TABLET | Freq: Every day | ORAL | Status: DC
Start: 2013-11-02 — End: 2013-11-05
  Administered 2013-11-02 – 2013-11-05 (×4): 20 mg via ORAL
  Filled 2013-11-02 (×4): qty 1

## 2013-11-02 MED ORDER — DEXTROSE 50 % IV SOLN
12.5000 g | INTRAVENOUS | Status: DC | PRN
Start: 2013-11-02 — End: 2013-11-05

## 2013-11-02 MED ORDER — FUROSEMIDE 40 MG OR TABS
60.0000 mg | ORAL_TABLET | Freq: Every day | ORAL | Status: DC
Start: 2013-11-02 — End: 2013-11-05
  Administered 2013-11-02 – 2013-11-05 (×4): 60 mg via ORAL
  Filled 2013-11-02 (×4): qty 1

## 2013-11-02 NOTE — Interdisciplinary (Signed)
Acknowledged Nursing Weight Loss & Chewing Triggers:  Ht/Wt: Height: 5\' 3"  (160 cm) Weight - scale: 58.6 kg (129 lb 3 oz) BMI: Body mass index is 22.89 kg/(m^2). IBW: 115 lb IBW%: 112% UBW: 128 lb  Weight loss reported: 12 lb (9%) wt loss over 3 months r/t "a personal loss."  Weight Hx:   Wt Readings from Last 20 Encounters:   11/02/13 58.6 kg (129 lb 3 oz)   09/23/13 52.617 kg (116 lb)   07/19/13 68.04 kg (150 lb)   06/23/13 57.153 kg (126 lb)   03/23/13 54.885 kg (121 lb)   12/22/12 53.524 kg (118 lb)   12/13/12 53.978 kg (119 lb)   12/10/12 53.978 kg (119 lb)   12/06/12 49.442 kg (109 lb)   12/01/12 57.9 kg (127 lb 10.3 oz)   09/02/12 59.875 kg (132 lb)   02/18/12 57.153 kg (126 lb)   12/17/11 58.968 kg (130 lb)   10/16/11 58.968 kg (130 lb)   09/17/11 59.875 kg (132 lb)   06/26/11 58.06 kg (128 lb)   03/31/11 58.06 kg (128 lb)   12/28/10 61.236 kg (135 lb)   08/22/10 61.236 kg (135 lb)   01/16/10 60.782 kg (134 lb)   Chewing: Pt reports difficulty chewing and accepted offer of mechanical soft diet; MD paged  Edema: RL extremity, non-pitting. LL extremity, trace per RN head to toe  RD/ DTR will f/u and assess pt per policy.  Lynne Logan, DTR. Pager # [phone]

## 2013-11-02 NOTE — Interdisciplinary (Signed)
Handoff report given to Antony Haste, Therapist, sports. Plan of care discussed, care endorsed.

## 2013-11-02 NOTE — Progress Notes (Signed)
Daily Progress Note:  11/02/2013     Current Hospital Stay:   1 day - Admitted on: 11/01/2013    Subjective:  Diarrhea and fevers resolved overnight.  Denies complaints today.  Bcx +GBS in 1/2 bottles.    Objective:    Vital Signs:  Temperature:  [98 F (36.7 C)-103.5 F (39.7 C)] 98 F (36.7 C) (03/25 0740)  Blood pressure (BP): (102-156)/(34-75) 102/41 mmHg (03/25 0700)  Heart Rate:  [59-84] 60 (03/25 0740)  Respirations:  [12-20] 14 (03/25 0740)  Pain Score:  [-] 0 (03/25 0600)  O2 Device:  [-] None (Room air) (03/25 0600)  SpO2:  [94 %-100 %] 100 % (03/25 0600)    Wt Readings from Last 1 Encounters:   11/02/13 58.6 kg (129 lb 3 oz)       Intake/Output (Current Shift):         Physical Exam:  General: awake, sitting in chair, NAD  Eyes: anicteric  Cardiovascular: Regular rate and rhythm without murmurs  Lungs: Clear to auscultation bilaterally, no wheezing, rales, or rhonchi  Abdomen: Bowel sounds present, soft, non-tender. No abdominal distention  Extremities: WWP, 1+ pitting edema to mid-shins    Laboratory data:   Lab Results   Component Value Date    NA 134* 11/02/2013    K 4.0 11/02/2013    CL 102 11/02/2013    BICARB 22 11/02/2013    BUN 20 11/02/2013    CREAT 0.96* 11/02/2013    GLU 170* 11/02/2013    [place] 8.5 11/02/2013     Lab Results   Component Value Date    WBC 7.7 11/02/2013    HGB 10.7* 11/02/2013    HCT 29.2* 11/02/2013    PLT 33* 11/02/2013    SEG 85* 11/02/2013    LYMPHS 7* 11/02/2013    MONOS 8 11/02/2013     Lab Results   Component Value Date    AST 36* 11/01/2013    ALT 31 11/01/2013    ALK 143* 11/01/2013    TBILI 1.85* 11/01/2013    TP 7.3 11/01/2013    ALB 3.2* 11/01/2013     Lab Results   Component Value Date    INR 1.5 11/01/2013    PTT 21.4* 11/01/2013     Lab Results   Component Value Date    PHUA 7.0 11/01/2013    SGUA 1.012 11/01/2013    GLUCOSEUA Trace* 11/01/2013    KETONEUA Negative 11/01/2013    BLOODUA Moderate* 11/01/2013    PROTEINUA 2+* 11/01/2013    LEUKESTUA Negative 11/01/2013    NITRITEUA Negative  11/01/2013    WBCUA 0-2 11/01/2013    RBCUA 21-50* 11/01/2013       Assessment and Plan:  8F with hcv cirrhosis, HTN, DM p/w fevers, found to have bacteremia    # GBS bacteremia: unclear source. F/u repeat bcx.  Change vanc to ctx 2g q24-.  ID consult w/help to determine rx course, potential enrollment in OPAT program    # fevers: resolved today, likely 2/2 bacteremia. Cont abx. F/u final ucx and bcx    # diarrhea: resolved today. F/u cdiff, cont to monitor.    # LLE edema>RLE edema:  LE [place] neg for dvt.     # Hypomagnesmia/Hypophosphatemic- likely 2/2 GI losses  -BMP daily  -replete prn    # Hyponatremia- improved today with ivf. Cont to monitor.     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 Followed by [facility], Dr. Andreas Ohm.   [place] surveillance: MRI pending as outpatient  EV surveillance: small EV, remote history of banding >10 years ago. No h/o UGIB: dc propanolol 28m daily as only small EV and noted hypotension/bradycardia   edema: Continue daily po lasix 628m aldactone 15024maily.  RUQ [place] eval for ascites (none seen in ED)  HE: no signs on exam today. Continue rifaximin    # GERD-famotidine    # DVT Ppx- d/c sc heparin due to thrombocytopenia    Dispo: pending results of repeat bcx; anticipate need for cont iv abx as outpatient

## 2013-11-02 NOTE — Interdisciplinary (Signed)
OCCUPATIONAL THERAPY EVALUATION NOTE    Admission Information    Reasons for Admission: 73 year old Spanish-speaking female with a history of cirrhosis, h/o Hep C with SVR, HTN, T2DM presenting with 1 day of high fevers and 5-6 episode of large volume watery, non-bloody diarrhea starting today at noon. Family members measured her fever at home which was noted to be over 101 F degrees  Reasons for Therapy: Difficulty with ADL's  ADL: Needs assist with ADLs/functional mobility  Type of Home: House  Home Layout: One level  Living Arrangements: Spouse / significant other  Bathroom: Grab bars;Tub/shower  Home Equipment: FWW;4-wheeled walker;SPC    Objective Assessment    Parameters:    Type of Eval: Evaluation 97003  RLE: No Precautions  LLE: No Precautions  Additional  Precautions/Contrandications:: No Precautions  Equipment Present: IV;Tele  Pain Score (0-10): 5  Pain Location: pt reports abdominal pain less than 5    Exam and Neuro Assessment:    Oral Exam: no deficits  Visual: no deficits  Auditory: no deficits  Level of Consciousness: alert  Orientation Level: oriented x4  Safety: good  Follows Commands: multistep  Coordination: No deficits  Sensation: No deficits  Spasticity: None    ADL and Mobility Assessment:    Feeding: Independent  Grooming/Hygiene: mod independent  Upper Body Dressing: min assist  Lower Body Dressing: min assist  Toileting: not able to participate  Bed Mobility: supervision  Toilet Transfer: supervision  Sit - Stand : supervision  Sitting Balance: Good  Standing Balance: Good  Endurance: good     Upper Extremity Assessment        Upper Extremity Comments: BUE strength and ROM WFL     Treatment Today    Type of Eval: Evaluation 97003  ADL Training [97535]: x1  Therapeutic Activities [97530]: x1                Treatment Plan  The goals listed below are achievable and realistic within the designated time frame and the treatments listed below, and referred to in the treatment plan, are necessary  to achieve these goals. The functional goals were created based on the patient's prior level of function.    Diagnosis: Difficulty in walking (dec activity tolerance ) 719.7    Assessment  Assessment: Pt presenting to OT at baseline fiunctioning. Pt's spouse home 24/7 and assists with dressing acts as needed. Pt without further skilled OT needs indicated at this time. Pt may benefit from home health safety evaluation if able to ensure safety in home and recommend equipment.     Problems  Problems: ADL impairment    Functional Limitations  Functional Limitation   Current Impairment Category: O6712: Self Care  Current Impairment %: Cl: 1-19% impaired, limited or restricted  Goal Impairment: W5809: Self Care  Goal Impairment  %: CI: 1-19% impaired, limited or restricted  Discharge Impairment: X8338: Self Care  Discharge Impairment %: CI: 1-19% impaired, limited or restricted  Rationale: Clinical judgement;Patient history and medical status    Goals  Patient Stated Goal: I want to feel better and return home    Recommendations  Equipment Recommendations: shower chair    Patient to continue therapy:   Frequency: One time only       Total Treatment Time (min): 45  Total Timed Treatment (min) : 30

## 2013-11-02 NOTE — Consults (Signed)
Hepatology Initial Consult Note  Consult Attending: Aleatha Borer, MD  Consult Fellow:  [name], MD  Current Attending: Lebron Conners., MD  Date: 11/02/2013    Patient Name: Paula Manning , MRN: [id number]    Hospital Day:   1 day - Admitted on: 11/01/2013    Reason for consultation: cirrhosis  Chief Complaint: fevers    History of Present Illness: Paula Manning is a 73 year old female with HCV GT 2 cirrhosis (achieved SVR) with history of mild volume OL, HE, also with known  branch-type IPMNs, presenting with fevers.     Patient was last seen in clinic 09/23/13. At that time was reporting ongoing balance issues 2/2 lumbar radiculopathy. Also wt loss because son died and she was having poor PO intake. No hospitalizations ore emergency care until this current admisssion. Reports she noted fever of 101 at home. Some overall malaise for at least the last 1.5 weeks. More LE edema and some increased abdominal swelling. Daughter thought she was a little disoriented yesterday but is better today. Did have some diarrhea as well which has since resolved. occ cough but non productive. No dysuria. In [place] eating street food 2 weeks ago.     Past Medical History:  Past Medical History   Diagnosis Date    Migraine headache     Glaucoma     Ascites 06/29/2007     History of paracentesis x 2    Pancreatic cyst 06/29/2007    Cirrhosis 06/24/2007     Active on the liver transplant waiting list with a blood type of O positive. 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    Type II or unspecified type diabetes mellitus with peripheral circulatory disorders, not stated as uncontrolled(250.70) 06/10/2007    HTN 06/10/2007    HTN 06/10/2007       Past Surgical History:   Past Surgical History   Procedure Laterality Date    Pb  laps surg cholecstc w/expl common duct  1970s     in [place];  open CCX    Pb cesarean delivery only      Pb cstoplasty/cstourtp plstc any       Bladder suspension    Egd procedure  06/01/2013     Procedure: GI EGD;  Surgeon: Jeanette Caprice, MD;  Location: [facility];  Service: Gastroenterology;;       Allergies:  Allergies   Allergen Reactions    Advil [Ibuprofen Micronized] Other     Intolerance           Home Medications:  Prescriptions prior to admission   Medication Sig Dispense Refill    bacitracin 500 UNIT/GM ointment Apply small amount to affected area on face twice daily  1 Tube  1    CALCIUM CARBONATE 600 MG OR TABS 1 tablet 2 times daily  one month  11    furosemide (LASIX) 20 MG tablet Take 3 tablets by mouth daily.  90 tablet  0    glipiZIDE (GLUCOTROL) 10 MG tablet Take 1 tablet by mouth 2 times daily.  60 tablet  3    insulin glargine (LANTUS) 100 UNIT/ML injection Inject 10 Units under the skin nightly.  1 Vial  2    multivitamin (MULTI-VITAMIN) tablet  Take 1 tablet by mouth daily.        propranolol (INDERAL) 20 MG tablet Take 1 tablet by mouth daily.        ranitidine (ZANTAC) 300 MG tablet Take 300 mg by mouth 2 times daily.        rifaximin (XIFAXAN) 550 MG tablet Take 550 mg by mouth every 12 hours.        spironolactone (ALDACTONE) 50 MG tablet Take 3 tablets by mouth daily.  90 tablet  3    VITAMIN D 400 UNIT OR CAPS 1 CAPSULE DAILY  30  11       Inpatient Medications:  Scheduled Meds   cefTRIAXone (ROCEPHIN) IVPB  2,000 mg Q24H NR    cholecalciferol  400 Units Daily    famotidine  20 mg Daily    furosemide  60 mg Daily    heparin  5,000 Units Q8H    insulin glargine  10 Units HS    insulin lispro  1-10 Units 4x Daily WC    multivitamin  1 tablet Daily    propranolol  20 mg Daily    rifaximin  550 mg Q12H    sodium chloride (PF)  3 mL Q8H    spironolactone  150 mg Daily     IV Meds   sodium chloride         Family History:  No family history on file.    Social  History:   History     Social History    Marital Status: Married     Spouse Name: N/A     Number of Children: N/A    Years of Education: N/A     Occupational History    retired      Social History Main Topics    Smoking status: Former Smoker -- 10 years     Quit date: 11/13/2012    Smokeless tobacco: Never Used    Alcohol Use: No    Drug Use: No    Sexual Activity: Not on file     Other Topics Concern    Not on file     Social History Narrative    No narrative on file       Review of Systems: -   Constitutional: +f/c/wt loss  ENT: negative for eye changes, oral lesions, difficulty swallowing  Cardiac: Denies chest pain or palpitations  Lungs: occ cough  GI: Negative except noted in the HPI  GU: Denies dysuria, hematuria  Skin: Denies rashes or other skin lesions  MSK: Denies joint deformities, new joint pain  Lymph: Denies enlarged nodes, night sweats, abnormal bleeding  Psych: negative for depressed mood, anxiety  Neuro: chronic balance issues, LE weakness    OBJECTIVE:     Vitals signs:     Latest Entry  Range (last 24 hours)    Temperature: 98 F (36.7 C)  Temp  Avg: 100.6 F (38.1 C)  Min: 98 F (36.7 C)  Max: 103.5 F (39.7 C)    Blood pressure (BP): 116/56 mmHg  BP  Min: 93/30  Max: 156/55    Heart Rate: 52  Pulse  Avg: 67.6  Min: 52  Max: 84    Respirations: 13  Resp  Avg: 16.7  Min: 12  Max: 20    SpO2: 100 %  SpO2  Avg: 98.1 %  Min: 94 %  Max: 100 %       No Data Recorded     Weight - scale: 58.6  kg (129 lb 3 oz)  Percentage Weight Change (%): 0 %    Intake/Output      11/01/13 0600 - 11/02/13 0559 11/02/13 0600 - 11/03/13 0559      4540-9811 1800-0559 Total 0600-1759 9147-8295 Total       Intake    P.O.  --  -- --  100  -- 100    I.V.  --  53 53  250  -- 250    Total Intake -- 53 53 350 -- 350       Output    Stool  --  320 320  --  -- --    Total Output -- 320 320 -- -- --       Net I/O     -- -267 -267 350 -- 350        Respiratory support:  Oxygen Therapy  SpO2: 100 %  O2 Device: None  (Room air)       Physical Exam:  GEN: NAD  HEENT: no sclera icterus, O/P: mmm no lesions  Lymph nodes: negative lymph nodes  Pulmonary: ctab  Cardiovascular: regular rate & rhythm, no murmurs, gallops or rubs, no peripheral edema  Gastrointestinal: soft/nt/ some distention, reducible RUQ hernia. No ttp.   Neurologic including mental status: Alert & oriented x3. No encephalopathy or asterixis  Dermatologic:no jaundice, rash, or spiders    Labs:     CBC  Recent Labs      11/01/13   2014  11/02/13   0430   WBC  6.8  7.7   HGB  11.6  10.7*   HCT  31.5*  29.2*   PLT  39*  33*   SEG  91*  85*   LYMPHS  4*  7*   MONOS  4*  8        Chemistry  Recent Labs      11/01/13   2022  11/02/13   1055   NA  132*  134*   K  3.8  4.0   CL  98  102   BICARB  26  22   BUN  18  20   CREAT  1.01*  0.96*   GLU  307*  170*   [place]  8.4*  8.5   MG  1.6*  2.8*   PHOS  1.8*  3.6     Recent Labs      11/01/13   2022   ALK  143*   AST  36*   ALT  31   TBILI  1.85*   ALB  3.2*          Coags  Recent Labs      11/01/13   2014   PT  16.1*   PTT  21.4*   INR  1.5       :   Endoscopic Diagnosis: 05/2013  1. Midline orophyarngeal   prominence at the arytenoid   2. Three columnns of grade 1 esophageal varices, no high   risk features   3. No gastric varices, mild portal hypertensive gastropathy   4. Few punctate clots in the antrum   5. 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 Continue propranolol 10 BID   2. ENT consultation to evaluate oropharyngeal findings   3. Repeat EGD in 1 year for varices screening     11/01/08 [facility]  Findings: 1) Fair prep to the cecum  2) External hemorrhoids  3) Rectal Varices  Endoscopic Diagnosis: 1) External Hemorrhoids  2) Rectal Varices    Imaging:  MRI Abdomen 07/2013  Please note that there is significant motion artifact which limits the  sensitivity and specificity of this examination.    1. Observation assessment:  LIRADS NEGATIVE - no reportable LIRADS observations  2.  Radiologic T stage deferred.  3. Radiologic recommendation: As per hepatology.  4. Stable thrombus of the portal confluence.  5. Portal hypertension characterized by cirrhosis, moderate ascites, and splenomegaly.  6. 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 Attention on followup.  7. Stable anterior abdominal wall subcutaneous fluid collection. Attention on followup.        ASSESSMENT / PLAN: Paula Manning is a 73 year old female with HCV GT 2 cirrhosis (achieved SVR) with history of mild volume OL, HE, also with known  branch-type IPMNs, presenting with fevers. Found to have GBS bacteremia    # GBS bacteremia. Unclear source. UA negative, CXR negative. Did have some diarrhea preceding this so perhaps translocation. No abd pain to suggest diverticulitis.   -- surveillance blood culture  -- on CTX for bacteremia  -- ID has been consulted for source   -- US of the abdomen and tap if there is a fluid pocket.     # EV: remote hx of banding 14 years ago. No hx bleed  -- grade 1 EV  -- hold propranolol in setting of active infection, low BPs    # Edema:  Has had LE edema, ascites. Also pleural effusion before as well.   -- On lasix 60 mg daily and aldactone 150 mg daily. Unclear if this is truly the dose she takes at home. Can see how she does on this in house.     # [place] surrveillance:   - no HCC; stable IPMN  - repeat surveillance 01/2014    # Ventral hernia: reducible. abdominal binder    # Nutrition:  - 2g sodium diet  - protein supplements. Has had a lot of weight loss as Neurosurgeon by:   [name], MD  Gastroenterology Fellow  11/02/2013  2:51 PM

## 2013-11-02 NOTE — Plan of Care (Signed)
Endorsed pt to day RN Chelsea. Plan of care endorsed.

## 2013-11-02 NOTE — Consults (Signed)
INFECTIOUS DISEASES FELLOW INPATIENT CONSULT   Date:  11/02/2013   Requesting Physician: Lebron Conners., MD  Reason for Consultation: GBS bacteremia  Staff Physician: Gara Kroner, MD    Current Hospital Stay:   1 day - Admitted on: 11/01/2013    History of Present Illness:     Paula Manning is a 73yoF with Hx of HepC cirrhosis (in SVR), T2DM who presented to ED 11/01/13 with fever and nonbloody diarrhea. She had noted increased lower extremity swelling and more tiredness over the last 1.5 wks leading up to admission, and then reports the sudden onset of profuse diarrhea and extreme weakness after eating a hot dog purchased on the street in [place] 2 days ago prompting her to come to ER the following day (11/01/13).    Temp in ED was 103.5, and blood cultures drawn at that time have grown group B strep, repeat cultures from today are pending. She was started on vancomycin and Zosyn on admission and Vanco was continued when GPCs were noted to be growing in blood.    On ROS: Currently feels much better and dnies abdominal pain, nausea, vomiting, diarrhea or rectal bleeding. Does feel that the swelling in her legs has improved overnight. No cough, congestion, rhinorrhea or sore throat. Denies dysuria, hematuria or frequency. Denies headache or any changes in vision or hearing. Denies rash, pruritis, jaundice, endorses bruising. Denies recent weight gain/loss.    Past Medical and Surgical History:  Past Medical History   Diagnosis Date    Migraine headache     Glaucoma     Ascites 06/29/2007     History of paracentesis x 2    Pancreatic cyst 06/29/2007    Cirrhosis 06/24/2007     Active on the liver transplant waiting list with a blood type of O positive. 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    Type II or unspecified type  diabetes mellitus with peripheral circulatory disorders, not stated as uncontrolled(250.70) 06/10/2007    HTN 06/10/2007    HTN 06/10/2007     Past Surgical History   Procedure Laterality Date    Pb laps surg cholecstc w/expl common duct  1970s     in [place];  open CCX    Pb cesarean delivery only      Pb cstoplasty/cstourtp plstc any       Bladder suspension    Egd procedure  06/01/2013     Procedure: GI EGD;  Surgeon: Jeanette Caprice, MD;  Location: [facility];  Service: Gastroenterology;;       Antibiotics:   Vancomycin 3/24 -     Prior:  Zosyn 3/24    Allergies   Allergen Reactions    Advil [Ibuprofen Micronized] Other     Intolerance         Inpatient Medications:   sodium chloride        cefTRIAXone (ROCEPHIN) IVPB  2,000 mg Q24H NR    cholecalciferol  400 Units Daily    famotidine  20 mg Daily    furosemide  60 mg Daily    heparin  5,000 Units Q8H    insulin glargine  10 Units HS    insulin lispro  1-10 Units 4x Daily WC    multivitamin  1  tablet Daily    propranolol  20 mg Daily    rifaximin  550 mg Q12H    sodium chloride (PF)  3 mL Q8H    spironolactone  150 mg Daily     Social History:   History     Social History    Marital Status: Married     Spouse Name: N/A     Number of Children: N/A    Years of Education: N/A     Occupational History    retired      Social History Main Topics    Smoking status: Former Smoker -- 10 years     Quit date: 11/13/2012    Smokeless tobacco: Never Used    Alcohol Use: No    Drug Use: No    Sexual Activity: Not on file     Other Topics Concern    Not on file     Social History Narrative    No narrative on file       Family History: No family history on file.    Review of Systems: See HPI for relevant ROS    Physical Exam:  Temperature:  [98 F (36.7 C)-103.5 F (39.7 C)] 98 F (36.7 C) (03/25 0740)  Blood pressure (BP): (93-156)/(30-75) 116/56 mmHg (03/25 1200)  Heart Rate:  [52-84] 52 (03/25 1200)  Respirations:  [12-20] 13 (03/25  1200)  Pain Score:  [-] 0 (03/25 1200)  O2 Device:  [-] None (Room air) (03/25 1200)  SpO2:  [94 %-100 %] 100 % (03/25 1200)    Gen: elderly woman, lying in bed with husband at bedside, oriented x3 but tired-appearing, NAD  HEENT: PERRL, conj pink, anicteric, MMM, O/P clear, edentulous, no cervical lymphadenopathy  Chest: CTAB  CV: RRR, no m/r/g  Back: no CVAT. No spinal TTP.  Abd: open cholecystectomy scar noted in RUQ, NABS, soft, nontender, moderately distended with +fluid wave, no guarding or rebound, no masses.  Gu: no foley catheter, adult diaper in place.  Rectal: +skin tags, no perirectal abscess.  Extr: warm hands, cool feet, no edema, varicosities noted in bilateral lower legs, good pulses throughout.  Skin: Clean, dry, intact; No rash or petechiae. +bruising on extremities and at IV sites.    Labs:   Recent Labs      11/01/13   2022  11/02/13   1055   NA  132*  134*   K  3.8  4.0   CL  98  102   BICARB  26  22   BUN  18  20   CREAT  1.01*  0.96*   E. [place]  8.4*  8.5   MG  1.6*  2.8*   PHOS  1.8*  3.6   TP  7.3   --    ALB  3.2*   --    TBILI  1.85*   --    AST  36*   --    ALT  31   --    ALK  143*   --        Recent Labs      11/01/13   2014  11/02/13   0430   WBC  6.8  7.7   HGB  11.6  10.7*   HCT  31.5*  29.2*   MCV  91.8  92.7   PLT  39*  33*   SEG  91*  85*   LYMPHS  4*  7*   MONOS  4*  8       Recent Labs      11/01/13   2014   PTT  21.4*   INR  1.5     Lactate 1.3    Microbiologic data:  MRSA screen - pending    Blood cultures  BCx     Stool  11/02/13 C. Diff tox pcr - pending  11/02/13 Stool Culture - pending    Imaging:    CXR 11/01/13: normal  [place] LE doppler 11/02/13: no e/o DVT  US abdo 11/02/13:  cirrhotic liver with mild ascites and sequelae of portal  hypertension including partial portal vein thrombosis, unchanged.     No biliary ductal dilatation or edema     Impression and Recommendations:  Paula Manning is a 73yoF with Hx of HepC cirrhosis (in SVR), T2DM who presented to ED 11/01/13 with fever and  nonbloody diarrhea. Temp in ED was 103.5, and blood cultures drawn at that time have grown group B strep.    #Group B strep bacteremia - likely source is leaky gut in the setting of diarrhea. Spontaneous translocation due to portal htn or SBP are other possiblities. She does not appear to have any perirectal abscess or other skin/soft tissue infection on exam, and she is much improved since starting on antibiotic therapy.  - D/C vancomycin and instead start ceftriaxone 2g q24h to cover for GBS bacteremia.  - If there is enough ascites to safely tap, would get diagnostic paracentesis (may need to be done by IR) in order to eval for SBP. If she has SBP, would need SBP ppx indefinitely in the future.    #Diarrhea - C. Diff is less likely in her given that she is on rifaximin.  - f/u c. Diff tox pcr and treat with Flagyl if indicated    The patient was seen and discussed with ID attending, Dr. Daphane Shepherd, MD  Infectious Disease Fellow  [phone]    [phone]    ID Attending Addendum  Patient seen and examined, data reviewed including labs and imaging. I have discussed with Dr. Berenice Primas and I agree with her findings, assessment, and plan, as above.  73 yo W with Hep C cirrhosis and DM2 admitted for fever. Fever preceded diarrhea. Diarrhea resolved as of today. Workup revealing of Strep agalactiae.  No skin abnormalities, scant ascites in abdomen, no focal findings aside from diarrhea (which actually occurred after fevers).  Remaining hx as per fellow note.  Exam as above - now afebrile, no murmur no e/o cellulitis, abd benign  Labs reviewed, Micro with 1/4 BCx bottles growing GBS from 3/24  Imaging reviewed - scant ascites, no DVT  #GBS bacteremia: Unclear etiology.  She is the right host for GBS infection (cirrhotic, diabetic). Unclear source, though given diarrhea possibly translocated from gut. No cellulitis or abscesses or e/o endocarditis  - recommend ceftriaxone for GBS bacteremia of unclear etiology x2  weeks  - follow-up sensitivities  #Diarrhea: Agree with eval for C diff, though symptoms seem to have already resolved  Gara Kroner M.D.   Infectious Disease Attending

## 2013-11-02 NOTE — Plan of Care (Signed)
Problem: Infection  Goal: Absence of infection signs and symptoms  Text paged first call MD and let them know that lab called and said patient's blood cultures on 3/24 were positive for Gram positive cocci in pairs and chains. Awaiting response.

## 2013-11-02 NOTE — Plan of Care (Signed)
Problem: Pain - Acute  Goal: Control of acute pain  Admin prn tylenol 650 mg PO per pt c/o 3/10 abd pain will reassess noted in doc flowsheets.    Problem: Falls - Risk of  Goal: Absence of falls  Per report-pt uses FWW and cane at home. Remains bedrest at this time. Bed locked and low position call light within reach bed alarm on family at bedside. PT/OT eval ordered for today.    Problem: Infection  Goal: Absence of infection signs and symptoms  Pt afebrile upon arrival to unit-98.70F orally. Pt already received vanco and zosyn doses in ED r/o SBP/gastroenteritis.

## 2013-11-02 NOTE — Plan of Care (Signed)
Problem: Pain - Acute  Goal: Control of acute pain  Patient is able to state presence of pain on a scale from 1-10. Not currently complaining of pain at this time, will continue to monitor and administer pain medication as required.    Problem: Falls - Risk of  Goal: Absence of falls  Patient has not fallen. Patient bed in low and locked position. Call light is within reach. Side rails are up x2. Patient has been oriented to room. White board is in use. Appropriate signage is posted outside room. Patient room kept free and clear of clutter. Patient ambulated with PT/OT today, physical therapist stated patient is able to ambulate independently and without assistance. Will provide standby assistance.     Problem: Infection  Goal: Absence of infection signs and symptoms  Text paged first call MD because lab just called and notified me that patient's blood cultures growing out Group B strep. Patient is receiving scheduled antibiotics, two more blood cultures sent this morning.

## 2013-11-02 NOTE — Interdisciplinary (Signed)
Received a call from lab stating patient's BMP was hemolyzed. Third green top today to be drawn.

## 2013-11-02 NOTE — Interdisciplinary (Signed)
PHYSICAL THERAPY EVALUATION NOTE + DISCHARGE SUMMARY    Admission Information    Reasons for Admission: Pt admitted on 11/01/13. 73 year old Spanish-speaking female with a history of cirrhosis, h/o Hep C with SVR, HTN, T2DM presenting with 1 day of high fevers and 5-6 episode of large volume watery, non-bloody diarrhea. PT consulted to assess pt's safety with mobility.  Reasons for Therapy: Difficulty with mobility  Mobility: Independent with mobility using assistive device  Type of Home: House  Home Layout: One level  Living Arrangements: Spouse / significant other  Home Equipment: FWW;4-wheeled walker;SPC    Objective Assessment    Parameters:    Type of Eval: Evaluation  RLE: No Precautions  LLE: No Precautions  Equipment Present: IV;Tele  Medications Affecting Tx: None  Pain Score (0-10): 0  Pain Location: No complaints of pain    Exam:    Oral Exam: No deficits  Visual: no deficits  Auditory: WNL  Level of Consciousness: Alert  Orientation Level: Oriented X4  Safety: Good  Follows Commands: Multistep  Neglect: None  Coordination: no deficits  Spasticity: none  Tone: normal  Upper Extremity Comments: OT consulted. Please see OT note for details.    Mobility Assessment:    Rolling: supervision  Supine - Sit: supervision  Sit - Supine: not able to participate (Pt left sitting up in chair)  Sit - Stand: supervision  Stand - Sit: supervision  Chair Transfer: supervision  Ambulation: supervision  Feet Ambulated: 200  Description of Gait: Slow pace, pt states normal for her. Steady with no LOBs. Slightly flexed posture.  Equipment Needed: FWW  Static Balance - Sitting: Good  Static Balance - Standing: Good  Dynamic Balance - Sitting: Good  Dynamic Balance - Standing: Good  Activity Tolerance: good    Lower Extremity Assessment    Splints/Positioning Devices: None  Lower Extremity Comments: B LE not formally tested, at least 3+/5, AROM WFL.  Upper Extremity Comments: OT consulted. Please see OT note for  details.    Treatment Today  Type of Eval: Evaluation  Gait Training [phone]: x2          Treatment Plan  The goals listed below are achievable and realistic within the designated time frame and the treatments listed below, and referred to in the treatment plan, are necessary to achieve these goals. The functional goals were created based on the patient's prior level of function.    Diagnosis: Difficulty in walking (dec activity tolerance ) 719.7    Assessment  Assessment: Pt presents for frequent fevers and diarrhea x 5-6 days. At baseline, pt uses cane inside of house and 4WW in community. Pt currently requiring supervision with all mobility, ambulated x 200' with FWW steadily with no LOBs. Pt is safe to ambulate with nursing during hospital stay and does not require further skilled PT at this time. Pt has no DME needs.       Functional Limitations  Functional Limitation   Current Impairment Category: V3710: Mobility  Current Impairment %: Cl: 1-19% impaired, limited or restricted  Goal Impairment: G2694: Mobility  Goal Impairment %: CI: 1-19% impaired, limited or restricted  Discharge Impairment: G8980: Mobility  Discharge Impairment %: CI: 1-19% impaired, limited or restricted  Rationale: Clinical judgement    Goals     Goal #1: PT will assess pt for safety with mobility and DME needs.  # Visits: 1-3  Progress: New    Recommendations  Equipment Recommendations: no equipment needed;has own equipment  Patient to continue therapy:   Frequency: Patient appropriate for discharge from therapy;One time only       Total Treatment Time (min): 45  Total Timed Treatment (min) : 30

## 2013-11-03 DIAGNOSIS — K7460 Unspecified cirrhosis of liver: Secondary | ICD-10-CM

## 2013-11-03 DIAGNOSIS — I798 Other disorders of arteries, arterioles and capillaries in diseases classified elsewhere: Secondary | ICD-10-CM

## 2013-11-03 LAB — URINE CULTURE: Urine Culture Result: NO GROWTH

## 2013-11-03 LAB — GLUCOSE (POCT)
Glucose (POCT): 108 mg/dL (ref 70–115)
Glucose (POCT): 182 mg/dL — ABNORMAL HIGH (ref 70–115)
Glucose (POCT): 244 mg/dL — ABNORMAL HIGH (ref 70–115)
Glucose (POCT): 210 mg/dL — ABNORMAL HIGH (ref 70–115)

## 2013-11-03 LAB — C.DIFFICILE TOXIN PCR, STOOL: C.Difficile Toxin, PCR: NOT DETECTED

## 2013-11-03 MED ORDER — LIDOCAINE HCL 1 % IJ SOLN
5.0000 mL | Freq: Once | INTRAMUSCULAR | Status: AC | PRN
Start: 2013-11-03 — End: 2013-11-04

## 2013-11-03 MED ORDER — SODIUM CHLORIDE 0.9 % IJ SOLN (CUSTOM)
10.0000 mL | INTRAMUSCULAR | Status: DC | PRN
Start: 2013-11-03 — End: 2013-11-05

## 2013-11-03 NOTE — Plan of Care (Signed)
Problem: Pain - Acute  Goal: Control of acute pain  Patient is able to state presence of pain on a scale from 1-10. Not currently complaining of pain at this time, will continue to monitor and administer pain medication as required.    Problem: Falls - Risk of  Goal: Absence of falls  Patient has not fallen. Patient bed in low and locked position. Call light is within reach. Side rails are up x2. Patient has been oriented to room. White board is in use. Appropriate signage is posted outside room. Patient room kept free and clear of clutter. Patient ambulated around the hall with walker.    Problem: Infection  Goal: Absence of infection signs and symptoms  WBC count yesterday 7.7. Receiving antibiotics as ordered. Continuing to monitor.

## 2013-11-03 NOTE — Plan of Care (Signed)
Problem: Pain - Acute  Goal: Control of acute pain  Outcome: Met  Pt able to verbally report pain using the numeric pain scale. Please see eMAR for dosing and administration record. Will con't to reassess for presence of pain.        Problem: Falls - Risk of  Goal: Absence of falls  Outcome: Met  The patient bed in the lowest position, upper side rails up, bed alarm is on wheels are lock, call light within reach, bedside table within reach, non-skid socks on, educated the patient in regards to calling for assistance when needed. The patient verbalize and demonstrate understanding. The patient is in clear view from the nursing station.          Problem: Infection  Goal: Absence of infection signs and symptoms  Outcome: Met  The patient does not present signs or symptoms of infections; afebrile; vital signs stable; will continue to monitor the patient for further risk of infections.         Problem: Discharge Planning  Goal: Participation in care planning  Outcome: Met  Patient oriented and alert. Discussed plans/goals for the day. Patient able to participate in discussion. Goals written on the board. Plan includes: prevention of further skin issues, pain management, continue antibiotic therapy. Will continue to monitor vital signs appropriate to patient's level of care.         Problem: Skin Integrity- Intact patient high risk for impaired skin integrity Braden scale less than or equal to 18  Goal: Skin remains intact  Outcome: Met  The patient reposition every two hours from right to left with pillow support. The patient skin is dry multiple sites of brusies; Protective ointment apply to high risk skin area; elbows, heels, and sacrum. The patient has adequate nutrition orders and tolerating meals well.

## 2013-11-03 NOTE — Plan of Care (Signed)
Problem: Discharge Planning  Goal: Participation in care planning  Outcome: Met  Patient oriented and alert. Discussed plans/goals for the day. Patient able to participate in discussion. Goals written on the board. Plan includes: prevention of further skin issues, pain management, continue antibiotic therapy. Will continue to monitor vital signs appropriate to patient's level of care.

## 2013-11-03 NOTE — Plan of Care (Signed)
Problem: Pain - Acute  Goal: Control of acute pain  Outcome: Met  Pt able to verbally report pain using the numeric pain scale. Please see eMAR for dosing and administration record. Will con't to reassess for presence of pain.           Problem: Falls - Risk of  Goal: Absence of falls  Outcome: Met  The patient bed in the lowest position, upper side rails up, bed alarm is on wheels are lock, call light within reach, bedside table within reach, non-skid socks on, educated the patient in regards to calling for assistance when needed. The patient verbalize and demonstrate understanding.     Problem: Infection  Goal: Glucose level within specified parameters  Outcome: Met

## 2013-11-03 NOTE — Progress Notes (Signed)
Hepatology Progress Note  Consult Attending: Aleatha Borer, MD  Consult Fellow:  [name], MD  Current Attending: Lebron Conners., MD  Date: 11/03/2013    Patient Name: Paula Manning , MRN: [id number]    Hospital Day:   2 days - Admitted on: 11/01/2013    Reason for consultation: cirrhosis  Chief Complaint: fevers    Events/Subjective  -- seen by ID  -- [place] with mild ascites  -- no fever, BMx3 only   -- says she feels stronger today. Walking, eating well    OBJECTIVE:     Vitals signs:     Latest Entry  Range (last 24 hours)    Temperature: 98.2 F (36.8 C)  Temp  Avg: 98.2 F (36.8 C)  Min: 98.1 F (36.7 C)  Max: 98.5 F (36.9 C)    Blood pressure (BP): 134/57 mmHg  BP  Min: 99/34  Max: 135/47    Heart Rate: 67  Pulse  Avg: 60.7  Min: 52  Max: 69    Respirations: 14  Resp  Avg: 14.5  Min: 13  Max: 16    SpO2: 99 %  SpO2  Avg: 99.6 %  Min: 99 %  Max: 100 %       No Data Recorded     Weight - scale: 59 kg (130 lb 1.1 oz)  Percentage Weight Change (%): 0.68 %    Intake/Output      11/02/13 0600 - 11/03/13 0559 11/03/13 0600 - 11/04/13 0559      [id number] 1800-0559 Total 0600-1759 1800-0559 Total       Intake    P.O.  100  -- 100  --  -- --    I.V.  300  -- 300  --  -- --    Total Intake 400 -- 400 -- -- --       Output    Urine  --  500 500  --  -- --    Total Output -- 500 500 -- -- --       Net I/O     400 -500 -100 -- -- --        Respiratory support:  Oxygen Therapy  SpO2: 99 %  O2 Device: None (Room air)       Physical Exam:  GEN: NAD  HEENT: no sclera icterus, O/P: mmm no lesions  Lymph nodes: negative lymph nodes  Pulmonary: ctab  Cardiovascular: regular rate & rhythm, no murmurs, gallops or rubs, no peripheral edema  Gastrointestinal: soft/nt/ some distention, reducible RUQ hernia. No ttp.   Neurologic including mental status: Alert & oriented x3. No encephalopathy or asterixis  Dermatologic:no jaundice, rash, or spiders    Labs:     CBC  Recent Labs      11/01/13   2014  11/02/13   0430   WBC  6.8   7.7   HGB  11.6  10.7*   HCT  31.5*  29.2*   PLT  39*  33*   SEG  91*  85*   LYMPHS  4*  7*   MONOS  4*  8        Chemistry  Recent Labs      11/01/13   2022  11/02/13   1055   NA  132*  134*   K  3.8  4.0   CL  98  102   BICARB  26  22   BUN  18  20   CREAT  1.01*  0.96*   GLU  307*  170*   [place]  8.4*  8.5   MG  1.6*  2.8*   PHOS  1.8*  3.6     Recent Labs      11/01/13   2022   ALK  143*   AST  36*   ALT  31   TBILI  1.85*   ALB  3.2*          Coags  Recent Labs      11/01/13   2014   PT  16.1*   PTT  21.4*   INR  1.5       :   Endoscopic Diagnosis: 05/2013  1. Midline orophyarngeal   prominence at the arytenoid   2. Three columnns of grade 1 esophageal varices, no high   risk features   3. No gastric varices, mild portal hypertensive gastropathy   4. Few punctate clots in the antrum   5. 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 Continue propranolol 10 BID   2. ENT consultation to evaluate oropharyngeal findings   3. Repeat EGD in 1 year for varices screening     11/01/08 COLO  Findings: 1) Fair prep to the cecum  2) External hemorrhoids  3) Rectal Varices    Endoscopic Diagnosis: 1) External Hemorrhoids  2) Rectal Varices    Imaging:  MRI Abdomen 07/2013  Please note that there is significant motion artifact which limits the  sensitivity and specificity of this examination.    1. Observation assessment:  LIRADS NEGATIVE - no reportable LIRADS observations  2. Radiologic T stage deferred.  3. Radiologic recommendation: As per hepatology.  4. Stable thrombus of the portal confluence.  5. Portal hypertension characterized by cirrhosis, moderate ascites, and splenomegaly.  6. 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 Attention on followup.  7. Stable anterior abdominal wall subcutaneous fluid collection. Attention on followup.        ASSESSMENT / PLAN: Paula Manning is a 73 year old female with HCV GT 2 cirrhosis (achieved SVR) with history  of mild volume OL, HE, also with known  branch-type IPMNs, presenting with fevers. Found to have GBS bacteremia    # GBS bacteremia. Unclear source. UA negative, CXR negative. Did have some diarrhea preceding this so perhaps translocation. No abd pain to suggest diverticulitis.   -- surveillance blood culture is negative. Will place PICC for 2 week course of CTX per ID   -- US of the abdomen with minimal ascites. Will no purse tap at this time --> consider SBP prophy after completion of the CTX    # EV: remote hx of banding 14 years ago. No hx bleed  -- grade 1 EV  -- holding propranolol in setting of active infection, low BPs and brady to 55 --> can see if bp/HR improve, consider restart in clinic    # Edema:  Has had LE edema, ascites. Also pleural effusion before as well.   -- On lasix 60 mg daily and aldactone 150 mg daily. Unclear if this is truly the dose she takes at home.  -- 2g sodium diet  -- stric in/outs    # HCC surrveillance:   - no HCC; stable IPMN  - repeat surveillance 01/2014    # Ventral hernia: reducible. abdominal binder    # Nutrition:  - 2g sodium diet  - protein supplements. Has had a lot of weight  loss as outaptient    Electronically Signed:   [name], MD  GI & Hepatology Fellow  11/03/2013  3:08 PM

## 2013-11-03 NOTE — Plan of Care (Signed)
Problem: Discharge Planning  Goal: Participation in care planning  No discharge orders received at this time. Will continue to monitor.     Problem: Skin Integrity- Intact patient high risk for impaired skin integrity Braden scale less than or equal to 18  Goal: Skin remains intact  Upon assessment no areas of skin breakdown or pressure ulcers noted. Pt is able to turn and reposition self frequently with assistance for pillow support. Will continue to reposition pt Q2H. Patient's right shin has discoloration. Continuing to monitor.

## 2013-11-03 NOTE — Progress Notes (Signed)
Daily Progress Note:  11/02/2013     Current Hospital Stay:   2 days - Admitted on: 11/01/2013    Subjective:  No fevers or diarrhea overnight.  No complaints today.  Tolerating po.    Objective:    Vital Signs:  Temperature:  [98.1 F (36.7 C)-98.5 F (36.9 C)] 98.2 F (36.8 C) (03/26 0718)  Blood pressure (BP): (99-135)/(34-59) 134/57 mmHg (03/26 0600)  Heart Rate:  [52-69] 67 (03/26 0600)  Respirations:  [13-16] 14 (03/26 0600)  Pain Score:  [-] 0 (03/26 0600)  O2 Device:  [-] None (Room air) (03/26 0600)  SpO2:  [99 %-100 %] 99 % (03/26 0600)    Wt Readings from Last 1 Encounters:   11/03/13 59 kg (130 lb 1.1 oz)       Intake/Output (Current Shift):         Physical Exam:  General: awake, sitting in chair, NAD  Eyes: anicteric  Cardiovascular: Regular rate and rhythm without murmurs  Lungs: Clear to auscultation bilaterally, no wheezing, rales, or rhonchi  Abdomen: Bowel sounds present, soft, non-tender. No abdominal distention  Extremities: WWP, 1+ pitting edema to mid-shins    Laboratory data:   Lab Results   Component Value Date    NA 134* 11/02/2013    K 4.0 11/02/2013    CL 102 11/02/2013    BICARB 22 11/02/2013    BUN 20 11/02/2013    CREAT 0.96* 11/02/2013    GLU 170* 11/02/2013    [place] 8.5 11/02/2013     Lab Results   Component Value Date    WBC 7.7 11/02/2013    HGB 10.7* 11/02/2013    HCT 29.2* 11/02/2013    PLT 33* 11/02/2013    SEG 85* 11/02/2013    LYMPHS 7* 11/02/2013    MONOS 8 11/02/2013     Lab Results   Component Value Date    AST 36* 11/01/2013    ALT 31 11/01/2013    ALK 143* 11/01/2013    TBILI 1.85* 11/01/2013    TP 7.3 11/01/2013    ALB 3.2* 11/01/2013     Lab Results   Component Value Date    INR 1.5 11/01/2013    PTT 21.4* 11/01/2013     Lab Results   Component Value Date    PHUA 7.0 11/01/2013    SGUA 1.012 11/01/2013    GLUCOSEUA Trace* 11/01/2013    KETONEUA Negative 11/01/2013    BLOODUA Moderate* 11/01/2013    PROTEINUA 2+* 11/01/2013    LEUKESTUA Negative 11/01/2013    NITRITEUA Negative 11/01/2013    WBCUA 0-2  11/01/2013    RBCUA 21-50* 11/01/2013       Assessment and Plan:  37F with hcv cirrhosis, HTN, DM p/w fevers, found to have bacteremia    # GBS bacteremia: unclear source. F/u repeat bcx -> cx from 3/25 ngtd for 24h.  Cont ctx 2g q24-.  ID consult w/help to determine rx course, potential enrollment in OPAT program. Likely 2 wk course, will consult PICC team and CM for [facility] arrangements.      # fevers: resolved today, likely 2/2 bacteremia. Cont abx. F/u final ucx and bcx    # diarrhea: resolved today. cdiff negative.    # LLE edema>RLE edema:  LE [place] neg for dvt.     # T2DM-  -Lantus 10U QAM  -Lispro sliding scale QHS, QAC    # HCV GT2 Cirrhosis-MELD 13. Followed by [facility], Dr. Andreas Ohm.   [place]din  surveillance: MRI pending as outpatient  EV surveillance: small EV, remote history of banding >10 years ago. No h/o UGIB: dc propanolol 65m daily as only small EV and noted hypotension/bradycardia   edema: Continue daily po lasix 646m aldactone 15037maily.  RUQ US [place] ascites.  HE: no signs on exam today. Continue rifaximin    # GERD-famotidine    # DVT Ppx- d/c sc heparin due to thrombocytopenia    Dispo: pending results of repeat bcx; anticipate need for cont iv abx as outpatient for 2wks

## 2013-11-03 NOTE — Consults (Addendum)
INFECTIOUS DISEASE FELLOW INPATIENT PROGRESS NOTE      Date:  11/03/2013   Current Hospital Stay:   3 days - Admitted on: 11/01/2013    Antibiotics:  CTX 3/25 -     Prior:  Zosyn 3/24  vanc 3/24-3/25    Allergies   Allergen Reactions    Advil [Ibuprofen Micronized] Other     Intolerance           Overnight Events:  Afebrile, no acute event overnight    Subjective:  No complains    Objective:  Vital Signs:  Temperature:  [97.8 F (36.6 C)-98.2 F (36.8 C)] 97.8 F (36.6 C) (03/27 0400)  Blood pressure (BP): (116-133)/(42-59) 130/51 mmHg (03/27 0400)  Heart Rate:  [66-75] 72 (03/27 0400)  Respirations:  [13-17] 16 (03/27 0400)  Pain Score:  [-] 0 (03/27 0400)  O2 Device:  [-] None (Room air) (03/26 2132)  SpO2:  [97 %-100 %] 97 % (03/27 0400)    Wt Readings from Last 1 Encounters:   11/03/13 59 kg (130 lb 1.1 oz)     Yesterday's intake and output:  03/26 0600 - 03/27 0559  In: 60 [P.O.:340; I.V.:50]  Out: -       Physical Exam:  Gen: elderly woman, lying in bed with husband at bedside, oriented x3 but tired-appearing, NAD  HEENT: PERRL, conj pink, anicteric, MMM, O/P clear, edentulous, no cervical lymphadenopathy  Chest: CTAB  CV: RRR, no m/r/g  Abd: open cholecystectomy scar noted in RUQ, NABS, soft, nontender, moderately distended with +fluid wave, no guarding or rebound, no masses.  Gu: no foley catheter, adult diaper in place.  Rectal: +skin tags, no perirectal abscess.  Extr: warm hands, cool feet, no edema, varicosities noted in bilateral lower legs, good pulses throughout.  Skin: Clean, dry, intact; No rash or petechiae. +bruising on extremities and at IV sites.  Lines: R hand PIV (3/24)    Laboratory data:   Lab Results   Component Value Date    NA 134* 11/02/2013    K 4.0 11/02/2013    CL 102 11/02/2013    BICARB 22 11/02/2013    BUN 20 11/02/2013    CREAT 0.96* 11/02/2013    GLU 170* 11/02/2013    [place] 8.5 11/02/2013     Lab Results   Component Value Date    WBC 7.7 11/02/2013    RBC 3.15 11/02/2013    HGB 10.7  11/02/2013    HCT 29.2 11/02/2013    MCV 92.7 11/02/2013    MCHC 36.6 11/02/2013    RDW 14.3 11/02/2013    PLT 33 11/02/2013    PLT 47 04/23/2009    MPV 11.3 11/02/2013     No results found for this basename: INR, PTT       Micro data:  MRSA screen - negative    BCx  3/25 - ngtd    Stool  3/25 C. Diff tox pcr - negative  3/25 stool WBC rare  3/25 Stool Culture - ngtd    Imaging:  CXR 11/01/13: normal    [place] LE doppler 11/02/13: no e/o DVT    [place] abdo 11/02/13:  Cirrhotic liver with mild ascites and sequelae of portal hypertension including partial portal vein thrombosis, unchanged.  No biliary ductal dilatation or edema     Impression and Recommendations:  Paula Manning is a 73yo woman with hx of HepC cirrhosis (in SVR), T2DM who presented to ED 11/01/13 with fever and nonbloody diarrhea. Temp in  ED was 103.5, and blood cultures drawn at that time have grown group B strep.    #Group B strep bacteremia - likely source is leaky gut in the setting of diarrhea. Spontaneous translocation due to portal htn or SBP are other possiblities. She does not appear to have any perirectal abscess or other skin/soft tissue infection on exam, and she is much improved since starting on antibiotic therapy.  - Cotninue Ceftriaxone 2g q24h to cover for GBS bacteremia.  - Patient should get 2 weeks of abx as outpatient to complete course of treatment. (stop date 11/16/13)  - If patient has good outpatient follow-up, her PMD can monitor weekly CBC and BMP while on antibiotic therapy and the PICC can be removed after completion of her antibiotic therapy.    #Diarrhea - C. Diff is less likely in her given that she is on rifaximin. Continue to monitor.    Will sign off at this time. Please contact on-call ID service with any question or concerns. Appreciate the interesting consult and thank you for allowing [place] to participate in this patient's care.    Patient reviewed and plan discussed with ID attending, Dr. Ricke Hey, MD  Infectious Disease  Fellow    ID Attending Addendum  Patient seen and examined, data reviewed including labs and imaging. I have discussed with Dr. Linward Headland and I agree with his findings, assessment, and plan, as above.  Blood cultures negative since original, diarrhea resolved.   #Group B Strep bacteremia of unclear etiology: Presumed from translocation from GI tract in host with DM and cirrhosis in setting of diarrhea. Now resolved.  Recommend 2 weeks of ceftriaxone as above.  If has good follow-up, can complete 2 weeks under supervision with prompt removal of PICC at end of therapy, otherwise please refer to OPAT if that is unable to be arranged to see if we can accommodate her promptly.  Will sign off. Please call if questions  Gara Kroner M.D.   Infectious Disease Attending

## 2013-11-03 NOTE — Interdisciplinary (Signed)
Text paged PICC nurse after receiving order, Charm Rings called back and let me know that patient cannot get PICC placed until blood cultures are negative. Will let MD know.

## 2013-11-03 NOTE — Interdisciplinary (Signed)
Nutrition Initial Assessment  Pt assessed at moderate nutritional risk    A: 73 year old Spanish-speaking female with a history of HCV GT2 cirrhosis acheived SVR, HE, HTN, T2DM presenting with 1 day of high fevers and 5-6 episode of large volume watery, non-bloody diarrhea starting on 3/24. 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    Ht: 5' 3"  (160 cm)  Wt: 129# (58.6 kg);  112% of @ 115# IBW; BMI: 22.8  Wt Hx: Today at 130#, +1 since admit.  Pt reports a UBW of 128#. Son translated @ bedside; pt denies any recent unintentional wt loss. Will use lowest admit wt to assess nutritional needs.   Likely the wt loss reported in DTR note is inaccurate given pt and family report. Per Wt hx trends in EPIC: wt has been ranging 118-129 lbs since May/2014 except an entry on 12/9 @ 150 lbs which is likely does not reflect a true wt.   Diet Rx: CHO limited, Mechanical Soft + Ensure TID.  GI:   Abdomen rounded, distended, bowel sounds present, passing flatus; x3 BM on 3/25. Pt reports some nausea early this morning, but it passed. No vomiting.NKFA. She does feel that food gets stuck in her throat sometimes.  Offered pureed texture option; prefers mechanical soft at this time and will chew foods well.   PO intake:  Only 2 meals has been recorded in EPIC since admit @ 2/6, 2/4 with an avg intake of 42% providing 702kcal ( 48% low end needs) and  37g protein (63% low end needs).  Pt states that prior to admit she had a good appetite and now has decreased, stating she does not like the food; ate 1/2 omelette, and toast.  Offered glucerna supplements to boost nutrient intake.  She agreed stating she takes them at home.  Would like vanilla.    I/O: 413m/320mL (+133, maintenance hydration needs likely  not met/ adequacy deferred to MD as pt is on diuretics); net since admit: +133 mL.   Labs: Na @ 134; Creat trending down @ 0.96; GFR trending up @ 57, Glu @  170, POCT range on 3/25: 173- 249 mg/dL; A1C 7.4, Mg high @ 2.8 (repleated yesterday), K,BUN, phos, all wnl. Labs (3/24):  AP/ AST slightly H @ 143/36, ALT wnl, T-bili @ 1.85 trends up, corrected [place] @ 9.04   MEDS: Ceftriaxone, cholecalciferol 400 units, pepcid, lasix, lantus 10, insulin lispro, Mg Sulfate (given yesterday), MVT daily, K Phos (given yesterday), rifaximin, spironolactone, vancomycin  SKIN INTEGRITY REVIEWED: normal for ethnicity, some discoloration to right shin/ankle  per head-to-toe assessments (3/26)    Est Needs: REE per Mifflin-St. Jeor (58.6 kg)= 1071 kcal x 1.3- 1.6  = 1465-1758 kcal  (25-30 kcal/kg actual bw); 59-88gm protein (1-1.5 gm/kg act bw); Maintenance Fluid Needs deferred to MD given pt on lasix.     D: Inadequate protein and calorie intake r/t medial condition AEB decreased appetite since admit and documented PO intake data.     I. Goal (3/26): To receive >75% of nutrition needs with acceptable tolerance by next visit.    PLANS / RECOMMENDATIONS:  1. REC to add 2g NA to current diet order of CHO limited diet w/ Mechanical soft texture.   2. REC to change to Glucerna Supplements (vanilla flavored) w/ meals.  3-  Highly rec to  consult SLP given pt report of swallowing difficulties.   4-REC to check Vit D3 (25-OH) and Vit A and quantitative Zn at next lab draw to assess need for supplementation.  5- Rec to add minerals to MVT, start daily caltrate and to continue current Vit D supplementation until Vit D(25- OH ) labs are available.   6- Given current POCT-BS labs, strongly rec to start nutritional insulin regimen and to adjust basal insulin regimen if needed  7- REC to check wt 3x/ week.    M/E:      POC/Coordination of Care: paged to  MD Bitton-Faiwiszewski @ [phone]  Disposition: Carb limited diet, 2g Na.  Education: Provided  for nutrient dense PO  RD will monitor PO intake/tolerance/adequacy, labs, GI function, wt, skin condition/changes and follow up per department policy.    Sandria Senter MS, Dietetic Intern, Pager # [phone]    Reviewed and edited above note, agree with current POC  Will continue to f/u per policy  Rochele Pages, MS, RD pager [phone]

## 2013-11-03 NOTE — Plan of Care (Signed)
Problem: Infection  Goal: Absence of infection signs and symptoms  Outcome: Met  The patient does not present signs or symptoms of infections; afebrile; vital signs stable; will continue to monitor the patient for further risk of infections.

## 2013-11-03 NOTE — Plan of Care (Signed)
Problem: Skin Integrity- Intact patient high risk for impaired skin integrity Braden scale less than or equal to 18  Goal: Skin remains intact  Outcome: Met  The patient reposition every two hours from right to left with pillow support. The patient skin is clean and dry. Protective ointment apply to high risk skin area; elbows, heels, and sacrum. The patient has adequate nutrition orders and tolerating meals well.

## 2013-11-04 DIAGNOSIS — Z950 Presence of cardiac pacemaker: Secondary | ICD-10-CM

## 2013-11-04 DIAGNOSIS — Z452 Encounter for adjustment and management of vascular access device: Secondary | ICD-10-CM

## 2013-11-04 DIAGNOSIS — A491 Streptococcal infection, unspecified site: Secondary | ICD-10-CM

## 2013-11-04 DIAGNOSIS — B951 Streptococcus, group B, as the cause of diseases classified elsewhere: Secondary | ICD-10-CM

## 2013-11-04 LAB — BLOOD CULTURE: Blood Culture: DETECTED

## 2013-11-04 LAB — MRSA SURVEILLANCE CULTURE

## 2013-11-04 LAB — GLUCOSE (POCT)
Glucose (POCT): 107 mg/dL (ref 70–115)
Glucose (POCT): 245 mg/dL — ABNORMAL HIGH (ref 70–115)
Glucose (POCT): 265 mg/dL — ABNORMAL HIGH (ref 70–115)
Glucose (POCT): 304 mg/dL — ABNORMAL HIGH (ref 70–115)

## 2013-11-04 NOTE — Progress Notes (Signed)
[facility] LOS: 3 days     Subjective/Interval History:  No complaints.  Ambulating, no acute events overnight.    Objective:    Vital Signs:   Temperature:  [97.8 F (36.6 C)-98.2 F (36.8 C)] 98.1 F (36.7 C) (03/27 0814)  Blood pressure (BP): (116-130)/(42-65) 124/65 mmHg (03/27 0814)  Heart Rate:  [68-75] 73 (03/27 0814)  Respirations:  [13-17] 14 (03/27 0814)  Pain Score:  [-] 0 (03/27 0814)  O2 Device:  [-] None (Room air) (03/26 2132)  SpO2:  [97 %-100 %] 97 % (03/27 0814)    Recent Labs      11/03/13   1146  11/03/13   1737  11/03/13   2031  11/04/13   0826   GLUCPOCT  182*  244*  210*  107     Wt Readings from Last 1 Encounters:   11/04/13 57.2 kg (126 lb 1.7 oz)     03/26 0600 - 03/27 0559  In: 390 [P.O.:340; I.V.:50]  Out: -     Physical Exam:  NAD  Lungs ctab  cv rrr  Abdomen distended +BS +fluid wave non tender  Ext 1 + pitting edema LE    Laboratory data:   Lab Results   Component Value Date    NA 134* 11/02/2013    K 4.0 11/02/2013    CL 102 11/02/2013    BICARB 22 11/02/2013    BUN 20 11/02/2013    CREAT 0.96* 11/02/2013    GLU 170* 11/02/2013    [place] 8.5 11/02/2013     No results found for this basename: WBC, HGB, HCT, PLT, SEG, BAND, LYMPHS, MONOS, EOS, NRBC     No results found for this basename: AST, ALT, ALK, TBILI, DBILI, TP, ALB     No results found for this basename: INR, PTT       Assessment and Plan:  28F with hcv cirrhosis, HTN, DM p/w fevers, found to have bacteremia    # GBS bacteremia: unclear source. F/u repeat bcx -> cx from 3/25 ngtd for 24h. Cont ctx 2g q24-. ID consult w/help to determine rx course, potential enrollment in OPAT program. Likely 2 wk course, will consult PICC team and CM for [facility] arrangements.     # fevers: resolved, likely 2/2 bacteremia. Cont abx. F/u final ucx and bcx    # diarrhea: resolved. cdiff negative.    # LLE edema>RLE edema: LE [place] neg for dvt.     # T2DM-  -Lantus 10U QAM  -Lispro sliding scale QHS, QAC    # HCV GT2 Cirrhosis-MELD  13. Followed by [facility], Dr. Andreas Ohm.   [place] surveillance: MRI pending as outpatient  EV surveillance: small EV, remote history of banding >10 years ago. No h/o UGIB: dc propanolol 64m daily as only small EV and noted hypotension/bradycardia   edema: Continue daily po lasix 633m aldactone 15081maily. RUQ US [place] ascites.  HE: no signs on exam today. Continue rifaximin    # GERD-famotidine    # DVT Ppx- d/c sc heparin due to thrombocytopenia    Dispo: pending results of repeat bcx; anticipate need for cont iv abx as outpatient for 2wks  --awaiting PICC line

## 2013-11-04 NOTE — Progress Notes (Signed)
Hepatology Progress Note  Consult Attending: Aleatha Borer, MD  Consult Fellow:  [name], MD  Current Attending: Lebron Conners., MD  Date: 11/03/2013    Patient Name: Paula Manning , MRN: [id number]    Hospital Day:   3 days - Admitted on: 11/01/2013    Reason for consultation: cirrhosis  Chief Complaint: fevers    Events/Subjective  -- afebrile  -- feels well, eager to go home    OBJECTIVE:     Vitals signs:     Latest Entry  Range (last 24 hours)    Temperature: 98.1 F (36.7 C)  Temp  Avg: 98 F (36.7 C)  Min: 97.8 F (36.6 C)  Max: 98.2 F (36.8 C)    Blood pressure (BP): 124/65 mmHg  BP  Min: 119/59  Max: 130/51    Heart Rate: 73  Pulse  Avg: 73.6  Min: 72  Max: 75    Respirations: 14  Resp  Avg: 15.2  Min: 14  Max: 16    SpO2: 97 %  SpO2  Avg: 97.8 %  Min: 97 %  Max: 99 %       No Data Recorded     Weight - scale: 57.2 kg (126 lb 1.7 oz)  Percentage Weight Change (%): -3.05 %    Intake/Output      11/03/13 0600 - 11/04/13 0559 11/04/13 0600 - 11/05/13 0559      1962-2297 [id number] Total 0600-1759 1740-8144 Total       Intake    P.O.  220  120 340  300  -- 300    I.V.  50  -- 50  --  -- --    Total Intake 270 120 390 300 -- 300       Output    Urine  --  -- --  1000  -- 1000    Total Output -- -- -- 1000 -- 1000       Net I/O     270 120 390 -700 -- -700        Respiratory support:  Oxygen Therapy  SpO2: 97 %  O2 Device: None (Room air)       Physical Exam:  GEN: NAD  HEENT: no sclera icterus, O/P: mmm no lesions  Lymph nodes: negative lymph nodes  Pulmonary: ctab  Cardiovascular: regular rate & rhythm, no murmurs, gallops or rubs, no peripheral edema  Gastrointestinal: soft/nt/ some distention, reducible RUQ hernia. No ttp.   Neurologic including mental status: Alert & oriented x3. No encephalopathy or asterixis  Dermatologic:no jaundice, rash, or spiders    Labs:     CBC  Recent Labs      11/01/13   2014  11/02/13   0430   WBC  6.8  7.7   HGB  11.6  10.7*   HCT  31.5*  29.2*   PLT  39*  33*   SEG   91*  85*   LYMPHS  4*  7*   MONOS  4*  8        Chemistry  Recent Labs      11/01/13   2022  11/02/13   1055   NA  132*  134*   K  3.8  4.0   CL  98  102   BICARB  26  22   BUN  18  20   CREAT  1.01*  0.96*   GLU  307*  170*   [place]  8.4*  8.5   MG  1.6*  2.8*   PHOS  1.8*  3.6     Recent Labs      11/01/13   2022   ALK  143*   AST  36*   ALT  31   TBILI  1.85*   ALB  3.2*          Coags  Recent Labs      11/01/13   2014   PT  16.1*   PTT  21.4*   INR  1.5       :   Endoscopic Diagnosis: 05/2013  1. Midline orophyarngeal   prominence at the arytenoid   2. Three columnns of grade 1 esophageal varices, no high   risk features   3. No gastric varices, mild portal hypertensive gastropathy   4. Few punctate clots in the antrum   5. 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 Continue propranolol 10 BID   2. ENT consultation to evaluate oropharyngeal findings   3. Repeat EGD in 1 year for varices screening     11/01/08 COLO  Findings: 1) Fair prep to the cecum  2) External hemorrhoids  3) Rectal Varices    Endoscopic Diagnosis: 1) External Hemorrhoids  2) Rectal Varices    Imaging:  MRI Abdomen 07/2013  Please note that there is significant motion artifact which limits the  sensitivity and specificity of this examination.    1. Observation assessment:  LIRADS NEGATIVE - no reportable LIRADS observations  2. Radiologic T stage deferred.  3. Radiologic recommendation: As per hepatology.  4. Stable thrombus of the portal confluence.  5. Portal hypertension characterized by cirrhosis, moderate ascites, and splenomegaly.  6. 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 Attention on followup.  7. Stable anterior abdominal wall subcutaneous fluid collection. Attention on followup.        ASSESSMENT / PLAN: Paula Manning is a 73 year old female with HCV GT 2 cirrhosis (achieved SVR) with history of mild volume OL, HE, also with known  branch-type IPMNs,  presenting with fevers. Found to have GBS bacteremia    # GBS bacteremia. Unclear source. UA negative, CXR negative. Did have some diarrhea preceding this so perhaps translocation. No abd pain to suggest diverticulitis.   -- PICC for 2 week course of CTX per ID --> placed today, f/u CXR  -- US of the abdomen with minimal ascites. Will no purse tap at this time --> cipro for SBP prophy after taken off CTX    # EV: remote hx of banding 14 years ago. No hx bleed  -- grade 1 EV  -- holding propranolol in setting of active infection, low BPs and brady to 55 --> will dc for now. Can re-eval in clinic.     # Edema:  Has had LE edema, ascites. Also pleural effusion before as well.   -- On lasix 60 mg daily and aldactone 150 mg daily. Unclear if this is truly the dose she takes at home.  -- 2g sodium diet  -- stric in/outs    # HCC surrveillance:   - no HCC; stable IPMN  - repeat surveillance 01/2014    # Ventral hernia: reducible. abdominal binder    # Nutrition:  - 2g sodium diet  - protein supplements. Has had a lot of weight loss as outaptient      Electronically Signed:   [name], MD  GI & Hepatology  Fellow  11/04/2013  2:25 PM

## 2013-11-04 NOTE — Interdisciplinary (Addendum)
Per Dr. Nelva Bush plan to dc on home ivabx. Met with patient, spouse, and daughter @ bedside. Discussed options for outpatient infusion vs home infusion. Spouse is agreeable to learning home infusion, does not have any preference on providers but would like a Spanish Speaking RN for home infusion to teach him. CM starting referral. Script obtained.  Patient pending PICC line placement.

## 2013-11-04 NOTE — Interdisciplinary (Signed)
Home infusion services arranged with [facility] [phone]. They have spanish speaking nurses that can go to pts home. Anticipate [facility] Saturday 11/05/13.

## 2013-11-04 NOTE — Discharge Summary (Signed)
Date of Admission:  11/01/2013  Date of Discharge:  11/05/2013    Patient Name:  Paula Manning    Principal Diagnosis (required):  Fever    Hospital Problem List (required):  Active Hospital Problems    Diagnosis    Group B streptococcal infection [041.02]    Cirrhosis of liver [571.5]    DM circ dis type II [250.70, 443.81]    Hypertensive disorder [401.9]      Resolved Hospital Problems    Diagnosis    *Fever [780.60]       Additional Hospital Diagnoses ("rule out" or "suspected" diagnoses, etc.):  None    Principal Procedure During This Hospitalization (required):  Ultrasound of abdomen    Other Procedures Performed During This Hospitalization (required):  Ultrasound of lower extremity    Procedure results are available in Chart Review in Epic.  For those providers external to , the key procedure results are listed below:  Abdominal US  1. Redemonstration of cirrhotic liver with mild ascites and sequelae of portal  hypertension including partial portal vein thrombosis, unchanged since   07/22/2013.  2. No biliary ductal dilatation or edema.  3. Partially visualized cystic pancreatic lesion. Please see previous MRI   report on 07/22/2013 for further discussion.     LE [place]  No evidence of deep venous thrombosis.     Consultations Obtained During This Hospitalization:  Hepatology  Infectious Diseases    Key consultant recommendations:  Hepatology  # GBS bacteremia. Unclear source. UA negative, CXR negative. Did have some diarrhea preceding this so perhaps translocation. No abd pain to suggest diverticulitis.   -- PICC for 2 week course of CTX per ID --> placed today, f/u CXR  -- US of the abdomen with minimal ascites. Will no purse tap at this time --> cipro for SBP prophy after taken off CTX  # EV: remote hx of banding 14 years ago. No hx bleed  -- grade 1 EV  -- holding propranolol in setting of active infection, low BPs and brady to 55 --> will dc for now. Can re-eval in clinic.   # Edema: Has had LE  edema, ascites. Also pleural effusion before as well.   -- On lasix 60 mg daily and aldactone 150 mg daily. Unclear if this is truly the dose she takes at home.  -- 2g sodium diet  -- stric in/outs  # HCC surrveillance:   - no HCC; stable IPMN  - repeat surveillance 01/2014  # Ventral hernia: reducible. abdominal binder  # Nutrition:  - 2g sodium diet  - protein supplements. Has had a lot of weight loss as outaptient    Infectious disease  Blood cultures negative since original, diarrhea resolved.   #Group B Strep bacteremia of unclear etiology: Presumed from translocation from GI tract in host with DM and cirrhosis in setting of diarrhea. Now resolved. Recommend 2 weeks of ceftriaxone as above. If has good follow-up, can complete 2 weeks under supervision with prompt removal of PICC at end of therapy, otherwise please refer to OPAT if that is unable to be arranged to see if we can accommodate her promptly.      Reason for Admission to the Hospital / History of Present Illness:  Paula Manning is a 73 year old Spanish-speaking female with a history of cirrhosis, h/o Hep C with SVR, HTN, T2DM presenting with 1 day of high fevers and 5-6 episode of large volume watery, non-bloody diarrhea starting today at noon. Family  members measured her fever at home which was noted to be over 101 F degrees, so they decided to bring her in to the ED. Yesterday, patient traveled to [facility] where she ate a hotdog from a street vendor. Two weeks ago she traveled to [facility] to visit her sister where she endorses eating some unusual foods, such as carne asada and different meats and cheeses.   For the last week and a half, the patient has been also feeling more fatigued than usual and noticing her legs swelling more as well. Per daughter at bedside, the patient's husband found her in the bathroom late yesterday confused and disoriented as to where she was located. This episode resolved itself and she has been alert and oriented  since per the family.   Patient endorses taking her medications daily including rifaximin and diuretics and following up with doctors. She does not actively drink alcohol or use any drugs.   Endorses some acid reflux like pain in the last few days, nausea but not vomiting. Denies BRBPR, melena or hematemesis. Denies chills, weight loss, syncope, dizziness, URI symptoms, CP, palpitations, SOB, or cough.    Hospital Course by Problem (required):  1. Fever: patient found to have group B strep bacteremia.  ID was consulted and PICC line placed to complete a 2 week course of IV ceftriaxone at home.  Fevers resolved.  PICC line can be removed at the end of her treatment    2. Diarrhea: resolved. Infectious work up for diarrhea was negative.     3. HCV cirrhosis: patient seen by hepatology while in house.  She was continued on her home medications.  She will discontinue her propranolol given her bradycardia on admission.  She will follow up in hepatology clinic at the end of her antibiotic course.  She was given a prescription for cipro daily after she completes her antibiotics, this is for SBP ppx.    4. DM: she was on her home dose of lantus 10 units with sliding scale.  She can resume this as well as her glipizide at discharge.  Tests Outstanding at Discharge Requiring Follow Up:  None    Discharge Condition (required):  Improved.    Key Physical Exam Findings at Discharge:  No significant physical examination findings at the time of discharge.    Discharge Diet:  Low-salt.    Discharge Medications:     What To Do With Your Medications      START taking these medications      Add'l Info    cefTRIAXone (ROCEPHIN) 2,000 mg in sodium chloride 50 mL IVPB   Inject 2,000 mg into vein every 24 hours.    Quantity:  12 vial   Refills:  0       ciprofloxacin 500 MG tablet   Commonly known as:  CIPRO   Take 1 tablet by mouth daily.   Start taking on:  11/17/2013    Quantity:  30 tablet   Refills:  1         CONTINUE taking these  medications      Add'l Info    bacitracin 500 UNIT/GM ointment   Apply small amount to affected area on face twice daily    Quantity:  1 Tube   Refills:  1       Calcium Carbonate 600 MG tablet   1 tablet 2 times daily    Quantity:  one month   Refills:  11  furosemide 20 MG tablet   Commonly known as:  LASIX   Take 3 tablets by mouth daily.    Quantity:  90 tablet   Refills:  0       glipiZIDE 10 MG tablet   Commonly known as:  GLUCOTROL   Take 1 tablet by mouth 2 times daily.    Quantity:  60 tablet   Refills:  3       insulin glargine 100 UNIT/ML injection   Commonly known as:  LANTUS   Inject 10 Units under the skin nightly.    Quantity:  1 Vial   Refills:  2       multivitamin tablet   Take 1 tablet by mouth daily.    Refills:  0       ranitidine 300 MG tablet   Commonly known as:  ZANTAC   Take 300 mg by mouth 2 times daily.    Refills:  0       spironolactone 50 MG tablet   Commonly known as:  ALDACTONE   Take 3 tablets by mouth daily.    Quantity:  90 tablet   Refills:  3       vitamin D 400 UNITS capsule   1 CAPSULE DAILY    Quantity:  30   Refills:  11       XIFAXAN 550 MG tablet   Take 550 mg by mouth every 12 hours.   Generic drug:  rifaximin    Refills:  0         STOP taking these medications         propranolol 20 MG tablet   Commonly known as:  INDERAL         Where to Get Your Medications    You need to pick up these prescriptions. We sent some of them to a specific pharmacy. Go to these places to get your medications.          [facility]   -  ciprofloxacin 500 MG tablet    [address]   [place] [address]   Phone:  [phone]   Hours:  Mon-Fri 9am-7:00pm, Sat-Sun 9am-5:00pm              Please check with staff for printed prescription or if prescription was faxed to your pharmacy.   -  cefTRIAXone (ROCEPHIN) 2,000 mg in sodium chloride 50 mL IVPB                      Allergies:  Allergies   Allergen Reactions    Advil [Ibuprofen  Micronized] Other     Intolerance           Discharge Disposition:  Home with home care services:  home infusion for antibiotics.    Discharge Code Status:  Full code / full care  This code status is not changed from the time of admission.    Follow Up Appointments:    Scheduled appointments:  Future Appointments  Date Time Provider [place]   11/11/2013 8:30 AM Elayne Snare [facility]   11/11/2013 9:00 AM Mabeline Caras, MD [facility]   12/23/2013 8:30 AM Rosalva Ferron, NP [facility]       For appointments requested for after discharge that have not yet been scheduled, refer to the Post Discharge Referrals section of the After Visit Summary.  Discharging Physician's Contact Information:  [facility] phone triage at [phone].

## 2013-11-04 NOTE — Discharge Instructions (Signed)
Diagnosis and Reason for Admission    You were admitted to the hospital for the following reason(s):  fever    Your full diagnosis list is located on this After Visit Summary in the Hospital Problems section.    What [facility] Hospital Stay    The main tests and treatments done for you during this hospitalization were:    You were found to have a bacteria infection in your blood.  You will need to complete the 2 week course of IV antibiotics at home.    The following evaluation is still important to complete after discharge from the hospital:  Follow up clinic    Instructions for After Discharge    Your diet at home should be a diabetic (low-sugar) diet.    Your activity level at home should be:  regular activity.    Specific activity restrictions:    None    Wound or tube care instructions:  None    Your medication list is located on this After Visit Summary in the Current Discharge Medication List section.  Your nurse will review this information with you before you leave the hospital.    It is very important for you to keep a current medication list with you in order to assist your doctors with your medical care.  Bring this After Visit Summary with you to your follow up appointments.    Additional Instructions for Patients with Diabetes    Hypoglycemia (low blood sugar): means that your blood sugar may be less than 70 mg/dL.  You might feel any of the the following symptoms:  - Dizzy, shaky, or feeling "tingly"  - Sweating  - Hard, fast heartbeat  - Headache  - Confusion or irritability    If you feel these symptoms, do the following:  1. Check your blood sugar, if it is less than 70 mg/dL it is too low.  2. Follow the Rule of 15- eat or drink something with 15 grams of sugar right away like:  - 3-4 glucose tabs, OR  - half a cup of juice or regular soda (not diet soda), OR  - 4 to 5 hard candies  3. Check your blood sugar again in 15 minutes.  If it is still too low, repeat Rule of 15.  4.  Recheck your blood sugar again in 15 minutes. If it is still low, call your healthcare provider right away.  5. Once your blood sugar rises, eat a small snack (like half a sandwich) if your next planned meal is more than half an hour away.    Call your doctor:  - If your blood sugar is less than 70 mg/dL twice in one day or twice in one week  - If your blood sugar is always greater than 300 mg/dL every time you check for two days in a row  - If you have vomiting or diarrhea and cannot keep any liquids down for more than 24 hours      Reasons to Contact a Doctor Urgently    Call 911 or return to the hospital immediately if:  Recurrent fevers or abdominal pains.    You should contact either your primary care physician or your hospital physician for any of the following reasons:  Questions regarding medications    Your hospital physician at the time of hospital discharge is {Hospitalist Attendings:13233}.    Your physicians strive to explain things in a way you can understand.  If you have any questions  or concerns about your hospital care or your medications, your hospital physician can be contacted in the following manner:  [facility] phone triage at [phone].    New problems and symptoms unrelated to your hospitalization are best addressed by your primary outpatient physician (PCP), as are requests for medication refills or appointment referrals after hospital discharge.  However, if new issues arise before you can see or contact your PCP, your hospital physician may be able to assist with these issues during this time.    What Needs to Happen Next After Discharge -- Appointments and Follow Up    Any appointments already scheduled at [facility] clinics will be listed in the Future Appointments section at the top of this After Visit Summary.      Sometimes tests performed in the hospital do not yet have results by the time a patient goes home.  The following key tests will need to be followed up at your  next appointment: {Outstanding Tests:13171}    [facility] primary care provider or clinic currently on file at [facility] is: Arlana Pouch.    Handouts Given to You (if applicable)

## 2013-11-04 NOTE — Plan of Care (Signed)
Problem: Pain - Acute  Goal: Control of acute pain  Outcome: Met  The pt denies pain today. Will monitor.    Problem: Falls - Risk of  Goal: Absence of falls  Outcome: Met  No falls this shift. Fall precautions in place - bed is in the low position, the call light and telephone are within reach, and the room is free of clutter. Fall assessment completed. Pt instructed to call for assistance as needed. The pt ambulates well with her FWW. There is a family member at the bedside continuously.    Problem: Infection  Goal: Absence of infection signs and symptoms  Outcome: Not Met  Pt is currently being treated for bacteremia. Temp is WDL, no recent white count available no s/s of infection on assessment. Cultures have been negative, so PICC line was placed today so the pt can go home and continue IV abx there.  Goal: Glucose level within specified parameters  Outcome: Not Met  BG level controlled with insulin today. See flowsheet and MAR for glucose levels and insulin admin.    Problem: Discharge Planning  Goal: Participation in care planning  Outcome: Met  Pt calm and cooperative and generally compliant with the recommended plan of care. Plan is for pt to go home and continue IV abx there. Awaiting placement with a home health service.  Goal: Verbalizes/demonstrates knowledge of discharge instructions  Outcome: Not Met  Goal: Verbalize knowledge of prescribed medication management plan  Outcome: Met  Goal: Able to perform ADL- independently or with minimal assist  Outcome: Met  Pt sometimes needs assist just to get to sitting position, but she is otherwise independent in all ADL's.    Problem: Skin Integrity- Intact patient high risk for impaired skin integrity Braden scale less than or equal to 18  Goal: Skin remains intact  Outcome: Met  Pt is thin skinned, and has scattered ecchymosis, but skin is generally intact and in good condition. Pt repositions self, able to walk and does so. Will continue to monitor.

## 2013-11-04 NOTE — Procedures (Signed)
Paula Manning is a 73 year old female patient.    ICD-9-CM   1. Fever 780.60   2. Cirrhosis of liver 571.5   3. Hypertensive disorder 401.9   4. Type II diabetes mellitus with peripheral circulatory disorder 250.70    443.81   5. Difficulty in walking(719.7) 719.7   6. Difficulty walking 719.7   7. 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    Type II or unspecified type diabetes mellitus with peripheral circulatory disorders, not stated as uncontrolled(250.70) 06/10/2007    HTN 06/10/2007    HTN 06/10/2007     Blood pressure 124/65, pulse 73, temperature 98.1 F (36.7 C), resp. rate 14, height 5\' 3"  (1.6 m), weight 57.2 kg (126 lb 1.7 oz), SpO2 97 %.    PICC Placement  Date/Time: 11/04/2013 11:45 AM  Performed by: Osborne Casco  Authorized by: Lebron Conners  Consent: Verbal consent obtained. Written consent obtained.  Risks and benefits: risks, benefits and alternatives were discussed  Consent given by: patient  Patient understanding: patient states understanding of the procedure being performed  Patient consent: the patient's understanding of the procedure matches consent given  Procedure consent: procedure consent matches procedure scheduled  Relevant documents: relevant documents present and verified  Test results: test results available and properly labeled  Site marked: the operative site was marked  Imaging studies: imaging studies available  Patient identity confirmed: verbally with patient, arm band,  provided demographic data and hospital-assigned identification number  Time out: Immediately prior to procedure a "time out" was called to verify the correct patient, procedure, equipment, support staff and site/side marked as required.  Indications: vascular access  Anesthesia: local infiltration  Local anesthetic: lidocaine 1% without epinephrine  Anesthetic total: 3 ml  Patient sedated: no  Pre-placement Checklist  Hand hygiene by all in room/procedure room: yes  CHG applicator used: yes   30 second scrub and 2 minute dry: yes  Operator wore: sterile gloves, mask with eye shield, sterile gown and hat  Maximum sterile barrier precautions: yes  Sterile field maintained during procedure: Sterile field maintained during procedure  Sterile technique maintained while applying dressing: Yes  CHG impregnated site patch placed: Yes  Dressing Date/Time: 11/04/2013 12:30 PM  Pre-procedure: landmarks identified  Preparation: skin prepped with chlorhexidine and sterile field established  Insertion side: right (brachial vein)   Catheter tip location: SVC.  Brand:Navilyst Francena Hanly (LOT# [id number])  Line type: PICC  Patient position: reverse Trendelenburg  Catheter type: single lumen  Ultrasound guidance: yes  Ultrasound guide placement: vessel located, needle entry and guide wire removed  Indications for ultrasound: safety    Reason for insertion: new indication  Number of attempts: 1  Successful placement: yes  Estimated Blood Loss: 5 ml  Attending was not presentPost-procedure: stat-lock applied and dressing applied  Assessment: blood return through all parts and  placement verified by x-ray  Complications: none  Follow-up: portable chest xray ordered and flush protocol by guideline    Patient tolerance: Patient tolerated the procedure well with no immediate complications  Comments: 36/0        Osborne Casco  11/04/2013

## 2013-11-04 NOTE — Plan of Care (Signed)
Problem: Pain - Acute  Goal: Control of acute pain  Outcome: Met  Resting comfortably in bed. Denies any pain.    Problem: Falls - Risk of  Goal: Absence of falls  Outcome: Met  Fall precautions observed. Call light button in reach at all times. Pt's niece at bedside and calls for additional help as needed. Bed alarm on.    Problem: Infection  Goal: Absence of infection signs and symptoms  Outcome: Met  Results for SOLEY, HARRISS (MRN [id number]) as of 11/04/2013 00:04    Ref. Range 11/02/2013 04:30   WBC Latest Range: 4.0-10.0 1000/mm3 7.7   Afebrile at this time. On daily rocephin as ordered with no s/sx of adverse reaction noted.    Problem: Skin Integrity- Intact patient high risk for impaired skin integrity Braden scale less than or equal to 18  Goal: Skin remains intact  Outcome: Met  Skin assessed; able to repositioned self with minimal assist.

## 2013-11-05 LAB — STOOL CULTURE: Shiga Toxin Result: NEGATIVE

## 2013-11-05 LAB — GLUCOSE (POCT): Glucose (POCT): 211 mg/dL — ABNORMAL HIGH (ref 70–115)

## 2013-11-05 MED ORDER — CIPROFLOXACIN HCL 500 MG OR TABS
500.0000 mg | ORAL_TABLET | Freq: Every day | ORAL | Status: DC
Start: 2013-11-17 — End: 2013-12-27

## 2013-11-05 MED ORDER — SODIUM CHLORIDE 0.9 % IV SOLN
2000.0000 mg | INTRAVENOUS | Status: AC
Start: 2013-11-05 — End: 2013-11-17

## 2013-11-05 NOTE — Plan of Care (Signed)
Problem: Discharge Planning  Goal: Verbalizes/demonstrates knowledge of discharge instructions  Outcome: Met  All discharge instructions read to her with niece translating into [name]. Niece say that she lives nearby.

## 2013-11-05 NOTE — Plan of Care (Signed)
Problem: Skin Integrity- Intact patient high risk for impaired skin integrity Braden scale less than or equal to 18  Goal: Skin remains intact  Outcome: Met  No evidence of hospital acquired skin breakdown.

## 2013-11-05 NOTE — Plan of Care (Signed)
Problem: Discharge Planning  Goal: Able to perform ADL- independently or with minimal assist  Outcome: Met  Able to engage in ADLs with minimal assistance. Ambulated in hallways with her own wheeled walker with a seat.

## 2013-11-05 NOTE — Interdisciplinary (Signed)
PT consult received and cleared. Pt evaluated by PT on 11/02/13 and was cleared for d/c from PT. One time eval completed only. At time of eval, pt walking 200' with supervision with FWW. No further skilled PT need per note. As of nursing note on 11/04/13, pt cont to amb well with FWW. PT spoke with RN/MD today, pt anticipated to d/c today. PT not indicated at this time. Thank you.

## 2013-11-05 NOTE — Plan of Care (Signed)
Problem: Pain - Acute  Goal: Control of acute pain  Outcome: Met  Assessed for pain using Numeric Scale. No complaints of severe pain at this time. Husband at bedside.    Problem: Falls - Risk of  Goal: Absence of falls  Outcome: Met  Patient noted with generalized weakness. Instructed to call if needed help/assistance. Call light within reach. No fall/injury noted.    Problem: Infection  Goal: Absence of infection signs and symptoms  Outcome: Not Met  Patient noted with [facility] PIC C line. Dressing C/D/I. On IV abx as  Ordered. Afebrile at this time. Latest WBC= 7.7    Problem: Discharge Planning  Goal: Participation in care planning  Outcome: Not Met  No D/C as of yet. Calm and cooperative w th care plan.     Problem: Skin Integrity- Intact patient high risk for impaired skin integrity Braden scale less than or equal to 18  Goal: Skin remains intact  Outcome: Met  Assessed skin condition. No skin breakdown noted at this time.

## 2013-11-05 NOTE — Plan of Care (Signed)
Problem: Infection  Goal: Absence of infection signs and symptoms  Outcome: Met  Afebrile, will be discharged to home today with [facility] following to give her IV Ceftriaxone daily.

## 2013-11-05 NOTE — Plan of Care (Signed)
Problem: Pain - Acute  Goal: Control of acute pain  Outcome: Met  Assessed for pain with am assessment. Denied. Instructed to call if having any pain. Remained pain free, discharged to home at 10:44.

## 2013-11-05 NOTE — Plan of Care (Signed)
Problem: Falls - Risk of  Goal: Absence of falls  Outcome: Met  Call light within reach. Non skid slippers on feet. Family member at side. Has own special walker which she used to ambulate in hallways after breakfast with family member at side.

## 2013-11-05 NOTE — Plan of Care (Signed)
Problem: Discharge Planning  Goal: Verbalize knowledge of prescribed medication management plan  Outcome: Met  This RN called [facility] to informed them that patient was being discharged and that next dose of daily Ceftriaxone IV is due at 15:00. Gave phone number to nurse's station if there were any questions. Case Manager arranged home infusion services of [facility] yesterday. Stressed medications ordered and what to continue at home. Patient reported that she had medications listed to continue at home. Verbalized understanding to start taking Cipro po after Ceftriaxone IV has completed its course, 11 more days.

## 2013-11-05 NOTE — Plan of Care (Signed)
Problem: Discharge Planning  Goal: Verbalizes/demonstrates knowledge of discharge instructions  Outcome: Met  Given booklet on heart failure in Spanish.

## 2013-11-05 NOTE — Progress Notes (Signed)
I have evaluated the patient today; she  will be discharged from the hospital today.     Today, her physical examination is notable for stable exam.    Please refer to the Discharge Summary for further details.    I spent 35 minutes on the patient's care unit in the preparation and execution of the hospital discharge.    Morad Tal R Sophya Vanblarcom

## 2013-11-06 LAB — BLOOD CULTURE: Blood Culture Result: NO GROWTH

## 2013-11-07 LAB — BLOOD CULTURE
Blood Culture Result: NO GROWTH
Blood Culture Result: NO GROWTH

## 2013-11-11 ENCOUNTER — Encounter (INDEPENDENT_AMBULATORY_CARE_PROVIDER_SITE_OTHER): Payer: 59

## 2013-11-11 ENCOUNTER — Ambulatory Visit (INDEPENDENT_AMBULATORY_CARE_PROVIDER_SITE_OTHER): Payer: 59 | Admitting: Ophthalmology

## 2013-11-11 DIAGNOSIS — H409 Unspecified glaucoma: Secondary | ICD-10-CM

## 2013-12-16 ENCOUNTER — Other Ambulatory Visit: Payer: 59 | Attending: Family

## 2013-12-16 DIAGNOSIS — K7460 Unspecified cirrhosis of liver: Principal | ICD-10-CM | POA: Insufficient documentation

## 2013-12-16 LAB — CBC WITH DIFF, BLOOD
ANC-Automated: 1.8 10*3/uL (ref 1.6–7.0)
Abs Eosinophils: 0.1 10*3/uL (ref 0.1–0.5)
Abs Lymphs: 0.4 10*3/uL — ABNORMAL LOW (ref 0.8–3.1)
Abs Monos: 0.2 10*3/uL (ref 0.2–0.8)
Basophils: 1 % (ref 0–2)
Eosinophils: 2 % (ref 1–4)
Hct: 34.2 % (ref 34.0–45.0)
Hgb: 12.1 gm/dL (ref 11.2–15.7)
Lymphocytes: 17 % — ABNORMAL LOW (ref 19–53)
MCH: 32.6 pg — ABNORMAL HIGH (ref 26.0–32.0)
MCHC: 35.4 % (ref 32.0–36.0)
MCV: 92.2 um3 (ref 79.0–95.0)
MPV: 11.1 fL (ref 9.4–12.4)
Monocytes: 9 % (ref 5–12)
Plt Count: 41 10*3/uL — ABNORMAL LOW (ref 140–370)
Plt Est: DECREASED
RBC: 3.71 10*6/uL — ABNORMAL LOW (ref 3.90–5.20)
RDW: 13.8 % (ref 12.0–14.0)
Segs: 71 % (ref 34–71)
WBC: 2.5 10*3/uL — ABNORMAL LOW (ref 4.0–10.0)

## 2013-12-16 LAB — COMPREHENSIVE METABOLIC PANEL, BLOOD
ALT (SGPT): 41 U/L — ABNORMAL HIGH (ref 0–33)
AST (SGOT): 42 U/L — ABNORMAL HIGH (ref 0–32)
Albumin: 3.4 g/dL — ABNORMAL LOW (ref 3.5–5.2)
Alkaline Phos: 161 U/L — ABNORMAL HIGH (ref 35–140)
Anion Gap: 8 mmol/L (ref 7–15)
BUN: 16 mg/dL (ref 6–20)
Bicarbonate: 27 mmol/L (ref 22–29)
Bilirubin, Tot: 1.21 mg/dL — ABNORMAL HIGH (ref <1.20)
Calcium: 9 mg/dL (ref 8.5–10.6)
Chloride: 104 mmol/L (ref 98–107)
Creatinine: 0.83 mg/dL (ref 0.51–0.95)
GFR: >60 mL/min
Glucose: 133 mg/dL — ABNORMAL HIGH (ref 70–115)
Potassium: 4 mmol/L (ref 3.5–5.1)
Sodium: 139 mmol/L (ref 136–145)
Total Protein: 7.4 g/dL (ref 6.0–8.0)

## 2013-12-16 LAB — PROTHROMBIN TIME, BLOOD
INR: 1.4
PT,Patient: 15.1 s — ABNORMAL HIGH (ref 9.7–12.5)

## 2013-12-23 ENCOUNTER — Encounter (HOSPITAL_BASED_OUTPATIENT_CLINIC_OR_DEPARTMENT_OTHER): Payer: 59 | Admitting: Family

## 2013-12-27 ENCOUNTER — Ambulatory Visit: Payer: 59 | Attending: Family | Admitting: Family

## 2013-12-27 VITALS — BP 138/63 | HR 66 | Temp 97.8°F | Resp 16 | Ht 63.0 in | Wt 128.3 lb

## 2013-12-27 DIAGNOSIS — I8500 Esophageal varices without bleeding: Secondary | ICD-10-CM | POA: Insufficient documentation

## 2013-12-27 DIAGNOSIS — R609 Edema, unspecified: Secondary | ICD-10-CM

## 2013-12-27 DIAGNOSIS — R188 Other ascites: Secondary | ICD-10-CM

## 2013-12-27 DIAGNOSIS — K7460 Unspecified cirrhosis of liver: Secondary | ICD-10-CM | POA: Insufficient documentation

## 2013-12-27 DIAGNOSIS — D490 Neoplasm of unspecified behavior of digestive system: Principal | ICD-10-CM | POA: Insufficient documentation

## 2013-12-27 DIAGNOSIS — E8779 Other fluid overload: Secondary | ICD-10-CM | POA: Insufficient documentation

## 2013-12-27 DIAGNOSIS — Z9189 Other specified personal risk factors, not elsewhere classified: Secondary | ICD-10-CM

## 2013-12-27 MED ORDER — CIPROFLOXACIN HCL 500 MG OR TABS
500.0000 mg | ORAL_TABLET | Freq: Every day | ORAL | Status: AC
Start: 2013-12-27 — End: ?

## 2013-12-27 MED ORDER — PROPRANOLOL HCL 10 MG OR TABS
10.00 mg | ORAL_TABLET | Freq: Two times a day (BID) | ORAL | Status: DC
Start: ? — End: 2014-01-17

## 2013-12-27 NOTE — Patient Instructions (Signed)
1. Please review medications at home to ensure she is taking them correctly    2. Low sodium diet (please give handout). Need to adhere to <2,000 mg/day    3. Check weight at home three times per week (if up 5 lbs in a week call provider)    4. Lab work in 2 weeks    If you have any questions or concerns please do not hesitate to call your care team at [phone].     Thank you,    Jeanette Caprice, MD  Associate Professor of Medicine  Director of [place], NP  Hepatology Nurse Practitioner  [place]    Earnest Conroy, [place]  Hepatology Nurse: refill medications or medical questions  [place]  D. [place]  Administrative Assistant: appointments, procedures or insurance issues  [place]  D. Line [phone]

## 2013-12-27 NOTE — Progress Notes (Signed)
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 She was treated for her HCV and achieved an SVR. She was last seen in this clinic 09/2013.  In the interim she was admitted for GBS bacteremia 3/24-3/28/2015. She is here today with her son Paula Manning. She complains of fluid retention. She has not been taken her diuretics correctly. She had Campbell soup and tortillas for dinner last night. She reports eating out frequently.     She reports continued balance issues secondary to lower extremity weakness. She was evaluated by neurology and diagnosed with lumabr radiculopathy. She was advised she needed surgery but has refused as she does not feel she is strong enough to tolerate procedure. She is being cautious to avoid falls and uses a walker. Her family assists her with mobility.         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    At risk for cancer: [facility] 06/23/2013    Acute dyspnea 12/15/2012    CAP (community acquired pneumonia) 11/30/2012    Hypoxia 11/30/2012    Nasal bone fx-closed 10/17/2011    Syncope 10/17/2011    Status post thoracentesis 12/28/2010    Pleural effusion associated with hepatic disorder 12/27/2010    Fracture of proximal humerus 08/14/2009    Hypertensive disorder 12/12/2008    Hyperlipidemia 12/12/2008    Microalbuminuria 12/12/2008    Osteoporosis 07/26/2007    Ascites 06/29/2007     History of paracentesis x 2      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  Blood type of O positive.  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  Type II or unspecified type diabetes mellitus with peripheral circulatory disorders, not stated as uncontrolled(250.70) 06/10/2007    HTN 06/10/2007    HTN 06/10/2007       Allergies   Allergen Reactions    Advil [Ibuprofen Micronized] Other     Intolerance           Current Outpatient Prescriptions on File Prior to Visit   Medication Sig Dispense Refill    bacitracin 500 UNIT/GM ointment Apply small amount to affected area on face twice daily 1 Tube 1    CALCIUM CARBONATE 600 MG OR TABS 1 tablet 2 times daily one month 11    ciprofloxacin (CIPRO) 500 MG tablet Take 1 tablet by mouth daily. 30 tablet 1    furosemide (LASIX) 20 MG tablet Take 3 tablets by mouth daily. 90 tablet 0    glipiZIDE (GLUCOTROL) 10 MG tablet Take 1 tablet by mouth 2 times daily. 60 tablet 3    insulin glargine (LANTUS) 100 UNIT/ML injection Inject 10 Units under the skin nightly. 1 Vial 2    multivitamin (MULTI-VITAMIN) tablet Take 1 tablet by mouth daily.       ranitidine (ZANTAC) 300 MG tablet Take 300 mg by mouth 2 times daily.      rifaximin (XIFAXAN) 550 MG tablet Take 550 mg by mouth every 12 hours.      spironolactone (ALDACTONE) 50 MG tablet Take 3 tablets by mouth daily. 90 tablet 3    VITAMIN D 400 UNIT OR CAPS 1 CAPSULE DAILY 30 11     No current facility-administered medications on file prior to visit.       History     Social History    Marital Status: Married     Spouse Name: N/A     Number of Children: N/A    Years of Education: N/A     Occupational History    retired      Social History Main Topics    Smoking status: Former Smoker -- 10 years     Quit date: 11/13/2012    Smokeless tobacco: Never Used    Alcohol Use: No    Drug Use: No    Sexual Activity: Not on file     Other Topics Concern    Not on file     Social History Narrative    No narrative on file        No family history on file.     Past Surgical History   Procedure Laterality Date    Pb laps surg cholecstc w/expl common duct  1970s     in [place];  open [place]    Pb cesarean delivery only      Pb cstoplasty/cstourtp plstc any       Bladder suspension    Egd procedure  06/01/2013     Procedure: GI EGD;  Surgeon: Jeanette Caprice, MD;  Location: [facility];  Service: Gastroenterology;;           Review of Systems -  Constitutional: no fever or chills, 2 lb weight gain since discharge (3/282015)  Eyes: no vision changes  HENT: no headaches, nose bleeds or oral lesions  Cardiovascular: no chest pain or peripheral edema  Respiratory: no SOB, dyspnea or hemoptysis  Gastrointestinal: no  nausea, vomiting, or hematemesis. No abdominal distention, + abdominal hernia  Genitourinary: no dysuria  Musculoskeletal: no pain or swelling of muscles or joints  Integumentary: no rashes or jaundice  Neurological: no tremors or difficulties with  memory  Psychiatric: Negative  Endocrine: no cold/heat intolerance, no excessive sweating or polyuria  Hematologic/Lymphatic: no easy bruising or  bleeding  Allergic/Immunologic: negative, see allergy list        Physical Exam: (9 systems examined)     Vital Signs: BP 138/63   Pulse 66   Temp(Src) 97.8 F (36.6 C) (Oral)   Resp 16   Ht 5\' 3"  (1.6 m)   Wt 58.196 kg (128 lb 4.8 oz)   BMI 22.73 kg/m2   SpO2 98%     BMI: Body mass index is 22.73 kg/(m^2).     Systems:  GEN: appears well, in no apparent distress. Alert and oriented times three, pleasant and cooperative.  HEENT: no sclera icterus, O/P: mmm no lesions  Lymph nodes:  negative lymph nodes  Pulmonary: moves air well bilaterally  Cardiovascular: regular rate & rhythm,  no murmurs, gallops or rubs, 2+ peripheral edema  Gastrointestinal: BS x4, soft NTND, liver and spleen not palpable, no ascites, abdominal scar RUQ s/p cholecystectomy, + reducible RUQ hernia  Rectal and genitalia: Not examined  Neurologic including mental status: Alert & oriented x3. No encephalopathy or asterixis  Dermatologic:no jaundice, rash, or spiders  Musculoskeletal: Normal tone        Laboratory:   Results for orders placed or performed in visit on 12/16/13   COMPREHENSIVE METABOLIC PANEL, BLOOD   Result Value Ref Range    Glucose 133 (H) 70 - 115 mg/dL    BUN 16 6 - 20 mg/dL    Creatinine 0.83 0.51 - 0.95 mg/dL    GFR >60 mL/min    Sodium 139 136 - 145 mmol/L    Potassium 4.0 3.5 - 5.1 mmol/L    Chloride 104 98 - 107 mmol/L    Bicarbonate 27 22 - 29 mmol/L    Anion Gap 8 7 - 15 mmol/L    Calcium 9.0 8.5 - 10.6 mg/dL    Total Protein 7.4 6.0 - 8.0 g/dL    Albumin 3.4 (L) 3.5 - 5.2 g/dL    Bilirubin, Tot 1.21 (H) <1.20 mg/dL    AST (SGOT) 42 (H) 0 - 32 U/L    ALT (SGPT) 41 (H) 0 - 33 U/L    Alkaline Phos 161 (H) 35 - 140 U/L   CBC WITH ADIFF, BLOOD   Result Value Ref Range    WBC 2.5 (L) 4.0 - 10.0 1000/mm3    RBC 3.71 (L) 3.90 - 5.20 mill/mm3    Hgb 12.1 11.2 - 15.7 gm/dL    Hct 34.2 34.0 - 45.0 %    MCV 92.2 79.0 - 95.0 um3    MCH 32.6 (H) 26.0 - 32.0 pgm    MCHC 35.4 32.0 - 36.0 %    RDW 13.8 12.0 - 14.0 %    MPV 11.1 9.4 -  12.4 fL    Plt Count 41 (L) 140 - 370 1000/mm3    Segs 71 34 - 71 %    Lymphocytes 17 (L) 19 - 53 %    Monocytes 9 5 - 12 %    Eosinophils 2 <1-4 %    Basophils 1 0 - 2 %    ANC-Automated 1.8 1.6 - 7.0 1000/mm3    Abs Lymphs 0.4 (L) 0.8 - 3.1 1000/mm3    Abs Monos 0.2 0.2 - 0.8 1000/mm3    Abs Eosinophils 0.1 <0.1-0.5 1000/mm3    Diff Type Automated     Plt Est Decreased     Macrocytic  1+     Anisocytosis 1+     Burr Cell 1+    PROTHROMBIN TIME, BLOOD   Result Value Ref Range    PT,Patient 15.1 (H) 9.7 - 12.5 sec    INR 1.4         Endoscopy:   Endoscopic Diagnosis: 05/2013  1. Midline orophyarngeal   prominence at the arytenoid   2. Three columnns of grade 1 esophageal varices, no high   risk features   3. No gastric varices, mild portal hypertensive gastropathy   4. Few punctate clots in the antrum   5. 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 Continue propranolol 10 BID   2. ENT consultation to evaluate oropharyngeal findings   3. Repeat EGD in 1 year for varices screening     Imaging:  Abdominal U/S 10/2013  1. Redemonstration of cirrhotic liver with mild ascites and sequelae of portal  hypertension including partial portal vein thrombosis, unchanged since   07/22/2013.    2. No biliary ductal dilatation or edema.    3. Partially visualized cystic pancreatic lesion. Please see previous MRI   report on 07/22/2013 for further discussion.       I have reviewed all lab documents in media and imaging and procedures in EPIC    Assessment and Plan: 73 yo hispanic female HCV GT 2 cirrhosis (achieved SVR)  fairly well compensated. EUS and several CT scans since 2005 are consistent with branch-type IPMN. She was admitted 3/24-3/28/2015 for GBS bacteremia. Unclear source. UA negative, CXR negative. Did have some diarrhea preceding this so perhaps translocation. No abd pain to suggest diverticulitis. She had a  PICC for 2 week course of CTX . She was prescribed Cipro 500 mg for SBP  prophylaxis for 1 month and discontinued.     She has not been taking her diuretics or xifaxan as prescribed.     1) Cirrhosis: MELD 11; CPT A-6    2) CHC GT 2: achieved SVR  - will check HCV PCR for durability of response    3) HCC surrveillance:   - no HCC; stable IPMN  - limited eovist MRI ordered    4) EV surveillance: small EV. Remote historyy of banding ~ 14 years ago. No h/o UGI bleeding  - Grade I EV. Continue with Propranolol 20 mg daily. HR 66  - prominence at the arytenoid seen with EGD. Seen by ENT and dx with large osteophyte with no impact on swallowing.    5) Ventral hernia:   - abdominal binder  - evaluated by surgeon and declined for repair  - advised her to call or go to the ER if she develops pain with nausea, vomiting or fever in case the hernia is strangulated    6) Fluid retention: poorly controlled 2/2 diet and not taking diuretics corectly  - discussed low sodium diet <2,000 mg, avoiding processed food and limit eating out  - spironolactone 150 mg daily + furosemide 80 mg   - cipro for SBP prophylaxis as this may have been the source of the recent bacteremia  - labs in 1 week    7) Diabetes: A1c 7.4.(10/2013) Managed by PCP  - on glipizide 10 mg twice daily  - on lantus 10 units qhs    8) H/o Pleural effusion: underlying emphysema  - continue with diurertics  - follow up with PCP  - advised if symptoms worsen (fever or increase SOB) to go to ED  9)  Nutrition:  - 2,000 low sodium diet  - spanish handouts given at prior appointment. Recommend Mrs Deliah Boston salt free for flavoring    10) Midline orophyarngeal prominence at the arytenoid (seen on EGD)  - evaluated by ENT 07/19/2013: large pharyngeal prominence represents a large osteophyte. No swallowing issues. Follow up with ENT prn    11) Return to clinic: 6/9 at 8:30 (labs prior to appointment)    Case discussed with Dr. Andreas Ohm. Advised to resume Cipro for SBP prophylaxis as this may have been the source of the recent episode of bacteremia.     Orlean Patten, NP

## 2013-12-29 ENCOUNTER — Telehealth (HOSPITAL_BASED_OUTPATIENT_CLINIC_OR_DEPARTMENT_OTHER): Payer: Self-pay | Admitting: Family

## 2013-12-29 NOTE — Telephone Encounter (Signed)
Patient returning call. Advised patient to continue Cipro per Orlean Patten, NP. Patient confirmed, will pick up medication at [facility] Follow up appointment confirmed for 6/9.

## 2013-12-30 NOTE — Telephone Encounter (Signed)
Noted  

## 2014-01-13 ENCOUNTER — Other Ambulatory Visit: Payer: 59 | Attending: Family

## 2014-01-13 DIAGNOSIS — E8779 Other fluid overload: Secondary | ICD-10-CM | POA: Insufficient documentation

## 2014-01-13 DIAGNOSIS — K7460 Unspecified cirrhosis of liver: Principal | ICD-10-CM | POA: Insufficient documentation

## 2014-01-13 DIAGNOSIS — R609 Edema, unspecified: Secondary | ICD-10-CM

## 2014-01-13 LAB — COMPREHENSIVE METABOLIC PANEL, BLOOD
ALT (SGPT): 45 U/L — ABNORMAL HIGH (ref 0–33)
AST (SGOT): 44 U/L — ABNORMAL HIGH (ref 0–32)
Albumin: 3.6 g/dL (ref 3.5–5.2)
Alkaline Phos: 174 U/L — ABNORMAL HIGH (ref 35–140)
Anion Gap: 14 mmol/L (ref 7–15)
BUN: 22 mg/dL — ABNORMAL HIGH (ref 6–20)
Bicarbonate: 27 mmol/L (ref 22–29)
Bilirubin, Tot: 1.59 mg/dL — ABNORMAL HIGH (ref <1.20)
Calcium: 9.3 mg/dL (ref 8.5–10.6)
Chloride: 97 mmol/L — ABNORMAL LOW (ref 98–107)
Creatinine: 1.08 mg/dL — ABNORMAL HIGH (ref 0.51–0.95)
GFR: 50 mL/min
Glucose: 132 mg/dL — ABNORMAL HIGH (ref 70–115)
Potassium: 4.2 mmol/L (ref 3.5–5.1)
Sodium: 138 mmol/L (ref 136–145)
Total Protein: 7.9 g/dL (ref 6.0–8.0)

## 2014-01-13 LAB — ALPHA FETOPROTEIN, BLOOD: AFP: 1.3 ng/mL (ref 0.0–8.3)

## 2014-01-16 ENCOUNTER — Ambulatory Visit
Admission: RE | Admit: 2014-01-16 | Discharge: 2014-01-16 | Disposition: A | Discharge status: Home / Self Care | Payer: 59 | Source: Ambulatory Visit | Attending: Body Imaging | Admitting: Body Imaging

## 2014-01-16 DIAGNOSIS — K862 Cyst of pancreas: Principal | ICD-10-CM | POA: Insufficient documentation

## 2014-01-16 DIAGNOSIS — Z9189 Other specified personal risk factors, not elsewhere classified: Secondary | ICD-10-CM

## 2014-01-16 DIAGNOSIS — K863 Pseudocyst of pancreas: Principal | ICD-10-CM | POA: Insufficient documentation

## 2014-01-16 DIAGNOSIS — K7460 Unspecified cirrhosis of liver: Secondary | ICD-10-CM

## 2014-01-17 ENCOUNTER — Ambulatory Visit: Payer: 59 | Attending: Family | Admitting: Family

## 2014-01-17 VITALS — BP 101/55 | HR 62 | Temp 97.8°F | Resp 16 | Ht 63.0 in | Wt 117.6 lb

## 2014-01-17 DIAGNOSIS — B182 Chronic viral hepatitis C: Secondary | ICD-10-CM | POA: Insufficient documentation

## 2014-01-17 DIAGNOSIS — D490 Neoplasm of unspecified behavior of digestive system: Secondary | ICD-10-CM | POA: Insufficient documentation

## 2014-01-17 DIAGNOSIS — R188 Other ascites: Principal | ICD-10-CM | POA: Insufficient documentation

## 2014-01-17 DIAGNOSIS — K7460 Unspecified cirrhosis of liver: Secondary | ICD-10-CM | POA: Insufficient documentation

## 2014-01-17 DIAGNOSIS — Z9189 Other specified personal risk factors, not elsewhere classified: Secondary | ICD-10-CM

## 2014-01-17 LAB — HEPATITIS C RNA, QUANT BLOOD: Hepatitis C RNA Quant: NOT DETECTED IU/mL

## 2014-01-17 MED ORDER — FUROSEMIDE 20 MG OR TABS
40.00 mg | ORAL_TABLET | Freq: Every day | ORAL | Status: AC
Start: 2014-01-17 — End: ?

## 2014-01-17 MED ORDER — SPIRONOLACTONE 50 MG OR TABS
100.00 mg | ORAL_TABLET | Freq: Every day | ORAL | Status: DC
Start: 2014-01-17 — End: 2014-04-10

## 2014-01-17 NOTE — Interdisciplinary (Signed)
Written and verbal instruction provided to patient, Paula Manning  who verbalized  understanding, no apparent distress noted at time of discharge and pt will f/u with appointment as indicated in AVS.     Patient Education  Identified learning needs: Medication,uses,dose,side effects.  Learner: patient and family member   Barriers to learning: none  Readiness to learn: acceptance  Method: Explanation and handout.  Treatment education given: N/A  Fall prevention education given: N/A  Pain education given: N/A  Response: Verbalized understanding

## 2014-01-17 NOTE — Patient Instructions (Signed)
1. lab work prior to next appointment    2. Decrease spironolactone to 100 mg daily and decrease furosemide to 40 mg daily    3. Check weight three times per week. If weight gain > 5lbs call provider    As a reminder, please do not go to the lab without confirming lab orders are in the computer for you. If you have any questions you may call the lab at [phone].    If you have any questions or concerns please do not hesitate to call your care team at [phone].     Thank you,    Jeanette Caprice, MD  Associate Professor of Medicine  Director of [place], NP  Hepatology Nurse Practitioner  [place]    Earnest Conroy, [place]  Hepatology Nurse: refill medications or medical questions  [place]  D. Line [place]  Administrative Assistant: appointments, procedures or insurance issues  [place]  D. Line [phone]

## 2014-01-17 NOTE — Progress Notes (Signed)
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 She was treated for her HCV and achieved an SVR. She was last seen in this clinic 12/2013.     She is here today with her son Paula Manning. Her fluid retention has improved with dietary changes and correctly taking her  diuretics.    She reports continued balance issues secondary to lower extremity weakness. She was evaluated by neurology and diagnosed with lumabr radiculopathy. She was advised she needed surgery but has refused as she does not feel she is strong enough to tolerate procedure. She is being cautious to avoid falls and uses a walker. Her family assists her with mobility.         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    At risk for cancer: [facility] 06/23/2013    Acute dyspnea 12/15/2012    CAP (community acquired pneumonia) 11/30/2012    Hypoxia 11/30/2012    Nasal bone fx-closed 10/17/2011    Syncope 10/17/2011    Status post thoracentesis 12/28/2010    Pleural effusion associated with hepatic disorder 12/27/2010    Fracture of proximal humerus 08/14/2009    Hypertensive disorder 12/12/2008    Hyperlipidemia 12/12/2008    Microalbuminuria 12/12/2008    Osteoporosis 07/26/2007    Ascites 06/29/2007     History of paracentesis x 2      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  Blood type of O positive.  Non-occlusive thrombus in the main  portal vein initially seen on imaging 09/2008  Due to Hep C      Emphysema 06/24/2007    Anemia 06/10/2007    EV (esophageal varices) 06/10/2007    Seizure Disorder 06/10/2007    DM circ dis type II 06/10/2007    Viral hepatitis C 06/10/2007     H/o GT2 but has achieved SVR.        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    Type II or unspecified type diabetes mellitus with peripheral circulatory disorders, not stated as uncontrolled(250.70) 06/10/2007    HTN  06/10/2007    HTN 06/10/2007       Allergies   Allergen Reactions    Advil [Ibuprofen Micronized] Other     Intolerance           Current Outpatient Prescriptions on File Prior to Visit   Medication Sig Dispense Refill    bacitracin 500 UNIT/GM ointment Apply small amount to affected area on face twice daily 1 Tube 1    CALCIUM CARBONATE 600 MG OR TABS 1 tablet 2 times daily one month 11    ciprofloxacin (CIPRO) 500 MG tablet Take 1 tablet by mouth daily. 30 tablet 5    furosemide (LASIX) 20 MG tablet Take 3 tablets by mouth daily. 90 tablet 0    glipiZIDE (GLUCOTROL) 10 MG tablet Take 1 tablet by mouth 2 times daily. 60 tablet 3    insulin glargine (LANTUS) 100 UNIT/ML injection Inject 10 Units under the skin nightly. 1 Vial 2    multivitamin (MULTI-VITAMIN) tablet Take 1 tablet by mouth daily.      propranolol (INDERAL) 10 MG tablet Take 10 mg by mouth 2 times daily.      ranitidine (ZANTAC) 300 MG tablet Take 300 mg by mouth 2 times daily.       rifaximin (XIFAXAN) 550 MG tablet Take 550 mg by mouth every 12 hours.      spironolactone (ALDACTONE) 50 MG tablet Take 3 tablets by mouth daily. 90 tablet 3    VITAMIN D 400 UNIT OR CAPS 1 CAPSULE DAILY 30 11     No current facility-administered medications on file prior to visit.       History     Social History    Marital Status: Married     Spouse Name: N/A     Number of Children: N/A    Years of Education: N/A     Occupational History    retired      Social History Main Topics    Smoking status: Former Smoker -- 10 years     Quit date: 11/13/2012    Smokeless tobacco: Never Used    Alcohol Use: No    Drug Use: No    Sexual Activity: Not on file     Other Topics Concern    Not on file     Social History Narrative    No narrative on file        No family history on file.     Past Surgical History   Procedure Laterality Date    Pb laps surg cholecstc w/expl common duct  1970s     in [place];  open CCX    Pb cesarean delivery only      Pb cstoplasty/cstourtp plstc any       Bladder suspension    Egd procedure  06/01/2013     Procedure: GI EGD;  Surgeon: Jeanette Caprice, MD;  Location: [facility];  Service: Gastroenterology;;           Review of Systems -  Constitutional: no fever or chills, intention weight loss 11 lbs since last visit  Eyes: no vision changes  HENT: no headaches, nose bleeds or oral lesions  Cardiovascular: no chest pain or peripheral edema  Respiratory: no SOB, dyspnea or hemoptysis  Gastrointestinal: no  nausea, vomiting, or hematemesis. No abdominal distention, + abdominal hernia  Genitourinary: no dysuria  Musculoskeletal: no pain or swelling of muscles or joints  Integumentary: no rashes or jaundice  Neurological: no tremors or difficulties with memory  Psychiatric: Negative  Endocrine: no cold/heat intolerance, no excessive sweating or polyuria  Hematologic/Lymphatic: no easy bruising or bleeding  Allergic/Immunologic: negative, see allergy list        Physical Exam: (9  systems examined)     Vital Signs: BP 101/55   Pulse 62   Temp(Src) 97.8 F (36.6 C) (Oral)   Resp 16   Ht 5\' 3"  (1.6 m)   Wt 53.343 kg (117 lb 9.6 oz)   BMI 20.84 kg/m2   SpO2 99%     BMI: Body mass index is 20.84 kg/(m^2).     Systems:  GEN: appears well, in no apparent distress. Alert and oriented times three, pleasant and cooperative.  HEENT: no sclera icterus, O/P: mmm no lesions  Lymph nodes:  negative lymph nodes  Pulmonary: moves air well bilaterally  Cardiovascular: regular rate & rhythm,  no murmurs, gallops or rubs, 2+ peripheral edema  Gastrointestinal: BS x4, soft NTND, liver and spleen not palpable, no ascites, abdominal scar RUQ s/p cholecystectomy, + reducible RUQ hernia  Rectal and genitalia: Not examined  Neurologic including mental status: Alert & oriented x3. No encephalopathy or asterixis  Dermatologic:no jaundice, rash, or spiders  Musculoskeletal: Normal tone        Laboratory:   Results for orders placed or performed in visit on 01/13/14   ALPHA FETOPROTEIN, BLOOD   Result Value Ref Range    AFP 1.3 0.0 - 8.3 ng/mL   COMPREHENSIVE METABOLIC PANEL, BLOOD   Result Value Ref Range    Glucose 132 (H) 70 - 115 mg/dL    BUN 22 (H) 6 - 20 mg/dL    Creatinine 1.08 (H) 0.51 - 0.95 mg/dL    GFR 50 mL/min    Sodium 138 136 - 145 mmol/L    Potassium 4.2 3.5 - 5.1 mmol/L    Chloride 97 (L) 98 - 107 mmol/L    Bicarbonate 27 22 - 29 mmol/L    Anion Gap 14 7 - 15 mmol/L    Calcium 9.3 8.5 - 10.6 mg/dL    Total Protein 7.9 6.0 - 8.0 g/dL    Albumin 3.6 3.5 - 5.2 g/dL    Bilirubin, Tot 1.59 (H) <1.20 mg/dL    AST (SGOT) 44 (H) 0 - 32 U/L    ALT (SGPT) 45 (H) 0 - 33 U/L    Alkaline Phos 174 (H) 35 - 140 U/L        Endoscopy:   Endoscopic Diagnosis: 05/2013  1. Midline orophyarngeal   prominence at the arytenoid   2. Three columnns of grade 1 esophageal varices, no high   risk features   3. No gastric varices, mild portal hypertensive gastropathy   4. Few punctate clots in the antrum   5. Mild duodenal  congestion consistent with portal   hypertension   Recommendations: 1. Continue propranolol 10 BID   2. ENT consultation to evaluate oropharyngeal findings   3. Repeat EGD in 1 year for varices screening     Imaging:  Limited Eovist MRI abdomen 01/2014    ASCITES: Mild perihepatic, perisplenic and pelvic     Negative Abbreviated MRI with gadoxetic acid (Eovist) for hepatocellular   cancer surveillance.    Numerous extensive pancreatic cysts throughout the pancreas, markedly enlarged  compared with CT from 2003 and demonstrating slow progressive increase in   size over time. 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 Given  the slow progressive increase in size, low-grade malignancy cannot be   excluded.  Recommendation: While the Eovist surveillance MRI is sufficient to evaluate   the liver, discussion of the pancreatic lesions at the [facility] conference is   suggested ; MRI with an extracellular agent should be considered.     I have reviewed all lab documents in media and imaging and procedures in EPIC    Assessment and Plan: 73 yo hispanic female HCV GT 2 cirrhosis (achieved SVR)  fairly well compensated. EUS and several CT scans since 2005 are consistent with branch-type IPMN. She was admitted 3/24-3/28/2015 for GBS bacteremia. Unclear source. UA negative, CXR negative. Did have some diarrhea preceding this so perhaps translocation. No abd pain to suggest diverticulitis. She had a  PICC for 2 week course of CTX . She was prescribed Cipro 500 mg for SBP prophylaxis for 1 month and discontinued.     1) Cirrhosis: MELD 13; CPT B-7    2) CHC GT 2: achieved SVR  - check HCV PCR with next labs    3) [place] surrveillance: Imaging from 12/2013  - no HCC; stable IPMN from 07/2013 but increased in size since 2003  - review pancreatic lesions at [facility]  - AFP 1.3 (01/2014)    4) EV surveillance: small EV. Remote historyy of banding ~ 14 years ago. No h/o UGI bleeding  - Grade I EV. Previously reported she was taking  Propranolol 20 mg but was not taking medication. Off  propranolol her HR is 62. She c/o some lightheadedness. Will keep her off propranolol for now.   - prominence at the arytenoid seen with EGD. Seen by ENT and dx with large osteophyte with no impact on swallowing.    5) Ventral hernia:   - abdominal binder  - evaluated by surgeon and declined for repair  - advised her to call or go to the ER if she develops pain with nausea, vomiting or fever in case the hernia is strangulated    6) Fluid retention: improved  - reinforced low sodium diet <2,000 mg, avoiding processed food and limit eating out  - decrease spironolactone to 100 (150) mg daily + decrease furosemide to 40 (60) mg  - cipro for SBP prophylaxis as this may have been the source of the recent bacteremia  - weights three times week. If increase weight > 5 lbs/week call provider    7) Diabetes: A1c 7.4.(10/2013) Managed by PCP  - on glipizide 10 mg twice daily  - on lantus 10 units qhs    8) H/o Pleural effusion: underlying emphysema  - continue with diurertics  - follow up with PCP  - advised if symptoms worsen (fever or increase SOB) to go to ED    9)  Nutrition:  - 2,000 low sodium diet  - spanish handouts given at prior appointment. Recommend Mrs Deliah Boston salt free for flavoring    10) Midline orophyarngeal prominence at the arytenoid (seen on EGD)  - evaluated by ENT 07/19/2013: large pharyngeal prominence represents a large osteophyte. No swallowing issues. Follow up with ENT prn    11) Return to clinic: 3 months (labs prior to appointment)       Orlean Patten, NP

## 2014-02-09 ENCOUNTER — Telehealth (HOSPITAL_BASED_OUTPATIENT_CLINIC_OR_DEPARTMENT_OTHER): Payer: Self-pay | Admitting: Family

## 2014-02-09 DIAGNOSIS — D490 Neoplasm of unspecified behavior of digestive system: Principal | ICD-10-CM

## 2014-02-09 NOTE — Telephone Encounter (Signed)
Case reviewed at [facility] (refer to media).     Findings:  pancreatic lesiosn increasing in size over the years. Likely side branch IPMNs.    Recommendation:  - follow up with Dr. Allen Norris. Referral placed in Epic  - RN please follow up with patient

## 2014-02-14 ENCOUNTER — Telehealth (INDEPENDENT_AMBULATORY_CARE_PROVIDER_SITE_OTHER): Payer: Self-pay | Admitting: Gastroenterology

## 2014-02-14 NOTE — Telephone Encounter (Signed)
Called and s/w pt with assistance from [place] interpreter and relayed MR findings and referral to GI for Dr. Allen Norris; pt confirmed and repeated GI scheduling phone number and stated she would call this week to make the appt  All questions answered at this time

## 2014-02-14 NOTE — Telephone Encounter (Signed)
Patient referred by Rosalva Ferron, NP to Dr. Allen Norris   Patient speak spanish only  Please call patient.

## 2014-03-02 ENCOUNTER — Ambulatory Visit (INDEPENDENT_AMBULATORY_CARE_PROVIDER_SITE_OTHER): Payer: 59 | Admitting: Gastroenterology

## 2014-03-02 VITALS — BP 133/52 | HR 70 | Temp 98.1°F | Ht 61.0 in | Wt 132.0 lb

## 2014-03-02 DIAGNOSIS — K863 Pseudocyst of pancreas: Secondary | ICD-10-CM

## 2014-03-02 DIAGNOSIS — R188 Other ascites: Secondary | ICD-10-CM

## 2014-03-02 DIAGNOSIS — K7460 Unspecified cirrhosis of liver: Secondary | ICD-10-CM

## 2014-03-02 DIAGNOSIS — K862 Cyst of pancreas: Principal | ICD-10-CM

## 2014-03-02 NOTE — Progress Notes (Signed)
Visit practitioner:  Tomasa Rand    Date / Time: 03/02/2014 9:45 PM    Referring Provider: Rosalva Ferron     Reason for Visit: Pancreatic IPMN    Type of Visit: Follow up    History of Present Illness:   Paula Manning is a 73 year old female with HCV cirrhosis who on recent MRI of the liver was noted to have numerous pancreatic cysts (largest 3 cm).  Of note she has had pancreatic cysts on imaging since 2003, and had EUS in 2005 and 2008 with multiple cysts up to 20 mm consistent with branch type IPMN.  She has no pancreatic symptoms.  She is here with her grandaughter.    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    At risk for cancer: [facility] 06/23/2013    Acute dyspnea 12/15/2012    CAP (community acquired pneumonia) 11/30/2012    Hypoxia 11/30/2012    Nasal bone fx-closed 10/17/2011    Syncope 10/17/2011    Status post thoracentesis 12/28/2010    Pleural effusion associated with hepatic disorder 12/27/2010    Fracture of proximal humerus 08/14/2009    Hypertensive disorder 12/12/2008    Hyperlipidemia 12/12/2008    Microalbuminuria 12/12/2008    Osteoporosis 07/26/2007    Ascites 06/29/2007     History of paracentesis x 2      Pancreatic cyst 06/29/2007     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  07/2007 EUS [facility]) Multiple pancreatic cysts throughout the pancreatic body. The largest cyst measured 20 mm x 14 mm. No associated masses were seen to suggest malignancy. The ampulla appeared normal. 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  Blood type of O  positive.  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        Hypertensive disorder 06/10/2007       Past Medical History   Diagnosis Date    Migraine headache     Glaucoma     Ascites 06/29/2007     History of paracentesis x 2    Pancreatic cyst 06/29/2007    Cirrhosis 06/24/2007     Active on the liver transplant waiting list with a blood type of O positive. 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    Type II or unspecified type diabetes mellitus with peripheral circulatory disorders, not stated as uncontrolled(250.70) 06/10/2007    HTN 06/10/2007    HTN 06/10/2007       Past Surgical  History   Procedure Laterality Date    Pb laps surg cholecstc w/expl common duct  1970s     in [place];  open CCX    Pb cesarean delivery only      Pb cstoplasty/cstourtp plstc any       Bladder suspension    Egd procedure  06/01/2013     Procedure: GI EGD;  Surgeon: Jeanette Caprice, MD;  Location: [facility];  Service: Gastroenterology;;       Current Medications:    bacitracin 500 UNIT/GM ointment Apply small amount to affected area on face twice daily   CALCIUM CARBONATE 600 MG OR TABS 1 tablet 2 times daily   ciprofloxacin (CIPRO) 500 MG tablet Take 1 tablet by mouth daily.   furosemide (LASIX) 20 MG tablet Take 2 tablets by mouth daily.   glipiZIDE (GLUCOTROL) 10 MG tablet Take 1 tablet by mouth 2 times daily.   insulin glargine (LANTUS) 100 UNIT/ML injection Inject 10 Units under the skin nightly.   multivitamin (MULTI-VITAMIN) tablet Take 1 tablet by mouth  daily.   ranitidine (ZANTAC) 300 MG tablet Take 300 mg by mouth 2 times daily.   rifaximin (XIFAXAN) 550 MG tablet Take 550 mg by mouth every 12 hours.   spironolactone (ALDACTONE) 50 MG tablet Take 2 tablets by mouth daily.   VITAMIN D 400 UNIT OR CAPS 1 CAPSULE DAILY       Allergies:  Allergies   Allergen Reactions    Advil [Ibuprofen Micronized] Other     Intolerance           Family History:  No family history on file.    History     Social History    Marital Status: Married     Spouse Name: N/A     Number of Children: N/A    Years of Education: N/A     Occupational History    retired      Social History Main Topics    Smoking status: Former Smoker -- 10 years     Quit date: 11/13/2012    Smokeless tobacco: Never Used    Alcohol Use: No    Drug Use: No    Sexual Activity: Not on file     Other Topics Concern    Not on file     Social History Narrative    No narrative on file       Review of Systems:  Weight loss:no  Trouble breathing: no  Chest pain: no  Abdominal pain: no  Nausea / vomitting: no  Kidney problems: no  Back pain: no  Seizures: no  Diabetes: no  Thyroid problems: no  Skin rash: no      Physical examination:   BP 133/52 mmHg   Pulse 70   Temp(Src) 98.1 F (36.7 C) (Oral)   Ht _0  (1.549 m)   Wt 59.875 kg (132 lb)   BMI 24.95 kg/m2 Body mass index is 24.95 kg/(m^2).  Wt Readings from Last 2 Encounters:   03/02/14 59.875 kg (132 lb)   01/17/14 53.343 kg (117 lb 9.6 oz)    Blood Pressure   03/02/14 133/52   01/17/14 101/55      Pain Score: 0  General:  Ill appearing elderly woman who needs walker for ambulation in no apparent distress.  Affect:  Normal  HEENT:  No scleral icterus, Trachea midline, mucus membranes moist.  Lungs:  Clear to auscultation bilaterally. Normal respiratory effort.  Cardiovascular:  Regular rate and rhythm  without murmurs, rubs or gallops.  Abdomen:  Abdomen soft, non tender, bowel sound present.  +fluid wave  Extremities:  No peripheral edema. Warm  well-perfused.  Skin:  Non-icteric and no visible rash.  No spider angiomas.  Neuro:  Cranial nerves II-XII, sensation, all motor strength grossly intact.  No asterixis.  Psych:  Alert oriented X3, No anxiety or depression. Normal affect.    Lab Results  Lab Results   Component Value Date    WBC 2.5 12/16/2013    RBC 3.71 12/16/2013    HGB 12.1 12/16/2013    HCT 34.2 12/16/2013    MCV 92.2 12/16/2013    MCHC 35.4 12/16/2013    RDW 13.8 12/16/2013    PLT 41 12/16/2013    PLT 47 04/23/2009    MPV 11.1 12/16/2013     Lab Results   Component Value Date    BUN 22 01/13/2014    CREAT 1.08 01/13/2014    CL 97 01/13/2014    NA 138 01/13/2014    K 4.2 01/13/2014    [place] 9.3 01/13/2014    TBILI 1.59 01/13/2014    ALB 3.6 01/13/2014    TP 7.9 01/13/2014    AST 44 01/13/2014    ALK 174 01/13/2014    BICARB 27 01/13/2014    ALT 45 01/13/2014    GLU 132 01/13/2014     Lab Results   Component Value Date    AST 44 01/13/2014    ALT 45 01/13/2014    GGT 12 10/16/2004    LDH 351 12/27/2010    ALK 174 01/13/2014    TP 7.9 01/13/2014    ALB 3.6 01/13/2014    TBILI 1.59 01/13/2014    DBILI 0.4 08/31/2012     IMPRESSION / RECOMMENDATION  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  I discussed with the patient and her grandaughter that the current data for branch type IPMN is that they tend to follow a relatively benign course with a low rate of cancer.  In the recent [place] paper Peggye Fothergill 2015) 1% risk after 7 years.  We also discussed the recent Rozelle Logan paper (DDS 2015) where pancreatic BD-IPMN tend to grow slowly over time which is normal in a 1/3 of the cases.  We also discussed that the 2015 AGA Guidelines on pancreatic cystic lesions recommend no further surveillance after 5 years if no significant change.  At this point, given that she has had pancreatic cysts for at least 12 years, that the progression has been slow (now 30 mm compared to 20 mm in 2008), that she has  no pancreatic symptoms, and that she is frail, elderly, and with multiple medical problems, that I would not recommend any further specific evaluation.  If she is to undergo periodic imaging surveillance for her cirrhosis, then her pancreas will be surveyed too.  I explained to her and her grandaughter that there is no guarantee that she will never get pancreatic cancer, but based on our current knowledge she is low risk for cancer development and at very high risk for an endoscopic or surgical complication, and can be followed clinically for now (which is consistent with recent AGA guidelines).  They are comfortable with this plan.

## 2014-04-07 ENCOUNTER — Telehealth (HOSPITAL_BASED_OUTPATIENT_CLINIC_OR_DEPARTMENT_OTHER): Payer: Self-pay | Admitting: Family

## 2014-04-10 ENCOUNTER — Other Ambulatory Visit (HOSPITAL_BASED_OUTPATIENT_CLINIC_OR_DEPARTMENT_OTHER): Payer: Self-pay

## 2014-04-10 DIAGNOSIS — R188 Other ascites: Principal | ICD-10-CM

## 2014-04-10 MED ORDER — SPIRONOLACTONE 50 MG OR TABS
100.0000 mg | ORAL_TABLET | Freq: Every day | ORAL | Status: DC
Start: 2014-04-10 — End: 2014-07-04

## 2014-04-10 NOTE — Telephone Encounter (Signed)
73 yo hispanic female HCV GT 2 cirrhosis (achieved SVR) fairly well compensated; last seen 01/17/14 with NP Celesta Aver; f/u 04/18/14 with NP Celesta Aver    Refill request for spiro 100 mg daily for management of ascites; pended  Attending is Dr. Andreas Ohm    Results for KAMYIAH, COLANTONIO (MRN [id number])    01/13/2014    Creatinine 1.08 (H)       Per chart review, called and s/w pt's husband with assistance from [place] interpreter to remind pt to have labs prior to f/u appt; he confirmed he will take pt for labs this week; labs are in [place]

## 2014-04-12 ENCOUNTER — Other Ambulatory Visit: Payer: 59 | Attending: Family

## 2014-04-12 DIAGNOSIS — K7460 Unspecified cirrhosis of liver: Principal | ICD-10-CM | POA: Insufficient documentation

## 2014-04-12 DIAGNOSIS — B182 Chronic viral hepatitis C: Secondary | ICD-10-CM | POA: Insufficient documentation

## 2014-04-12 LAB — CBC WITH DIFF, BLOOD
ANC-Automated: 2.6 10*3/uL (ref 1.6–7.0)
Abs Eosinophils: 0.1 10*3/uL (ref 0.1–0.7)
Abs Lymphs: 0.5 10*3/uL — ABNORMAL LOW (ref 0.8–3.1)
Abs Monos: 0.3 10*3/uL (ref 0.2–0.8)
Eosinophils: 1 % (ref 1–4)
Hct: 35 % (ref 34.0–45.0)
Hgb: 12.7 gm/dL (ref 11.2–15.7)
Lymphocytes: 15 % — ABNORMAL LOW (ref 19–53)
MCH: 33.1 pg — ABNORMAL HIGH (ref 26.0–32.0)
MCHC: 36.3 % — ABNORMAL HIGH (ref 32.0–36.0)
MCV: 91.1 um3 (ref 79.0–95.0)
MPV: 11 fL (ref 9.4–12.4)
Monocytes: 9 % (ref 5–12)
Plt Count: 48 10*3/uL — ABNORMAL LOW (ref 140–370)
Plt Est: DECREASED
RBC: 3.84 10*6/uL — ABNORMAL LOW (ref 3.90–5.20)
RDW: 14.7 % — ABNORMAL HIGH (ref 12.0–14.0)
Segs: 74 % — ABNORMAL HIGH (ref 34–71)
WBC: 3.5 10*3/uL — ABNORMAL LOW (ref 4.0–10.0)

## 2014-04-12 LAB — COMPREHENSIVE METABOLIC PANEL, BLOOD
ALT (SGPT): 38 U/L — ABNORMAL HIGH (ref 0–33)
AST (SGOT): 32 U/L (ref 0–32)
Albumin: 3.4 g/dL — ABNORMAL LOW (ref 3.5–5.2)
Alkaline Phos: 174 U/L — ABNORMAL HIGH (ref 35–140)
Anion Gap: 16 mmol/L — ABNORMAL HIGH (ref 7–15)
BUN: 21 mg/dL — ABNORMAL HIGH (ref 6–20)
Bicarbonate: 21 mmol/L — ABNORMAL LOW (ref 22–29)
Bilirubin, Tot: 1.8 mg/dL — ABNORMAL HIGH (ref <1.20)
Calcium: 9.1 mg/dL (ref 8.5–10.6)
Chloride: 101 mmol/L (ref 98–107)
Creatinine: 1.05 mg/dL — ABNORMAL HIGH (ref 0.51–0.95)
GFR: 51 mL/min
Glucose: 119 mg/dL — ABNORMAL HIGH (ref 70–115)
Potassium: 3.7 mmol/L (ref 3.5–5.1)
Sodium: 138 mmol/L (ref 136–145)
Total Protein: 7.6 g/dL (ref 6.0–8.0)

## 2014-04-12 LAB — PROTHROMBIN TIME, BLOOD
INR: 1.5
PT,Patient: 15.8 s — ABNORMAL HIGH (ref 9.7–12.5)

## 2014-04-14 LAB — HEPATITIS C RNA, QUANT BLOOD: Hepatitis C RNA Quant: NOT DETECTED IU/mL

## 2014-04-17 ENCOUNTER — Ambulatory Visit (INDEPENDENT_AMBULATORY_CARE_PROVIDER_SITE_OTHER): Payer: 59 | Admitting: Ophthalmology

## 2014-04-18 ENCOUNTER — Ambulatory Visit: Payer: 59 | Attending: Family | Admitting: Family

## 2014-04-18 VITALS — BP 124/63 | HR 75 | Temp 97.8°F | Ht 61.0 in | Wt 118.0 lb

## 2014-04-18 DIAGNOSIS — Z9189 Other specified personal risk factors, not elsewhere classified: Secondary | ICD-10-CM

## 2014-04-18 DIAGNOSIS — K7460 Unspecified cirrhosis of liver: Secondary | ICD-10-CM | POA: Insufficient documentation

## 2014-04-18 DIAGNOSIS — I8500 Esophageal varices without bleeding: Secondary | ICD-10-CM | POA: Insufficient documentation

## 2014-04-18 DIAGNOSIS — D490 Neoplasm of unspecified behavior of digestive system: Principal | ICD-10-CM | POA: Insufficient documentation

## 2014-04-18 DIAGNOSIS — R188 Other ascites: Secondary | ICD-10-CM

## 2014-04-18 MED ORDER — LUMIGAN 0.01 % OP SOLN
OPHTHALMIC | Status: AC
Start: 2014-04-13 — End: ?

## 2014-04-18 MED ORDER — ALPHAGAN P 0.1 % OP SOLN
OPHTHALMIC | Status: AC
Start: 2014-04-06 — End: ?

## 2014-04-18 MED ORDER — ALENDRONATE SODIUM 70 MG OR TABS
ORAL_TABLET | ORAL | Status: AC
Start: 2014-04-13 — End: ?

## 2014-04-18 NOTE — Interdisciplinary (Signed)
Written and verbal instruction provided to patient, Paula Manning  who verbalized  understanding, no apparent distress noted at time of discharge and pt will f/u with appointment as indicated in AVS.     Patient Education  Identified learning needs: Medication,uses,dose,side effects.  Learner: patient and family member   Barriers to learning: none  Readiness to learn: acceptance  Method: Explanation and handout.  Treatment education given: N/A  Fall prevention education given: N/A  Pain education given: N/A  Response: Verbalized understanding     EGD letter given.

## 2014-04-18 NOTE — Progress Notes (Signed)
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 She was treated for her HCV and achieved an SVR. She was last seen in this clinic 01/2014. In the interim she has not required any emergency care or hospitalizations.     She is here today with her son Paula Manning. Her weight has been stable and she denies any peripheral edema or increased abdominal girth. She    She continues to have balance issues secondary to lower extremity weakness. She was evaluated by neurology and diagnosed with lumabr radiculopathy. She was advised she needed surgery but has refused as she does not feel she is strong enough to tolerate procedure. She is being cautious to avoid falls and uses a walker. Her family assists her with mobility.         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    At risk for cancer: [facility] 06/23/2013    Acute dyspnea 12/15/2012    CAP (community acquired pneumonia) 11/30/2012    Hypoxia 11/30/2012    Nasal bone fx-closed 10/17/2011    Syncope 10/17/2011    Status post thoracentesis 12/28/2010    Pleural effusion associated with hepatic disorder 12/27/2010    Fracture of proximal humerus 08/14/2009    Hypertensive disorder 12/12/2008    Hyperlipidemia 12/12/2008    Microalbuminuria 12/12/2008    Osteoporosis 07/26/2007    Ascites 06/29/2007     History of paracentesis x 2      Pancreatic cyst 06/29/2007     Likely intraductal papillary mucinous tumor of the pancreas (IPMT)  07/2004 EUS ([facility]) Benign appearing pancreatic neck cyst measuring 20 mm x 10 mm with internal septation.  07/2007 EUS [facility]) Multiple  pancreatic cysts throughout the pancreatic body. The largest cyst measured 20 mm x 14 mm. No associated masses were seen to suggest malignancy. The ampulla appeared normal. 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  Blood type of O positive.  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        Hypertensive disorder 06/10/2007         Past Medical History   Diagnosis Date    Migraine headache     Glaucoma     Ascites 06/29/2007     History of paracentesis x 2    Pancreatic cyst 06/29/2007    Cirrhosis 06/24/2007     Active on the liver transplant waiting list with a blood  type of O positive. 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    Type II or unspecified type diabetes mellitus with peripheral circulatory disorders, not stated as uncontrolled(250.70) 06/10/2007    HTN 06/10/2007    HTN 06/10/2007       Allergies   Allergen Reactions    Advil [Ibuprofen Micronized] Other     Intolerance           Current Outpatient Prescriptions on File Prior to Visit   Medication Sig Dispense Refill    bacitracin 500 UNIT/GM ointment Apply small amount to affected area on face twice daily 1 Tube 1    CALCIUM CARBONATE 600 MG OR TABS 1 tablet 2 times daily one month 11    ciprofloxacin (CIPRO) 500 MG tablet Take 1 tablet by mouth daily. 30 tablet 5    furosemide (LASIX) 20 MG tablet Take 2 tablets by  mouth daily. 90 tablet 0    glipiZIDE (GLUCOTROL) 10 MG tablet Take 1 tablet by mouth 2 times daily. 60 tablet 3    insulin glargine (LANTUS) 100 UNIT/ML injection Inject 10 Units under the skin nightly. 1 Vial 2    multivitamin (MULTI-VITAMIN) tablet Take 1 tablet by mouth daily.      ranitidine (ZANTAC) 300 MG tablet Take 300 mg by mouth 2 times daily.      rifaximin (XIFAXAN) 550 MG tablet Take 550 mg by mouth every 12 hours.      spironolactone (ALDACTONE) 50 MG tablet Take 2 tablets by mouth daily. 60 tablet 3    VITAMIN D 400 UNIT OR CAPS 1 CAPSULE DAILY 30 11     No current facility-administered medications on file prior to visit.       History     Social History    Marital Status: Married     Spouse Name: N/A     Number of Children: N/A    Years of Education: N/A     Occupational History    retired      Social History Main Topics    Smoking status: Former Smoker -- 10 years     Quit date: 11/13/2012    Smokeless tobacco: Never Used    Alcohol Use: No    Drug Use: No    Sexual Activity: Not on file     Other Topics Concern    Not on file     Social History Narrative    No narrative on file        No family history on file.     Past Surgical History   Procedure Laterality Date    Pb laps surg cholecstc w/expl common duct  1970s     in [place];  open [place]    Pb cesarean delivery only      Pb cstoplasty/cstourtp plstc any       Bladder suspension    Egd procedure  06/01/2013     Procedure: GI EGD;  Surgeon: Jeanette Caprice, MD;  Location: [facility];  Service: Gastroenterology;;           Review of Systems -  Constitutional: no fever or weight gain  Eyes: no vision changes  HENT: no headaches, nose bleeds or oral lesions  Cardiovascular: no chest pain or peripheral edema  Respiratory: no SOB, dyspnea or hemoptysis  Gastrointestinal: no  nausea, vomiting, or hematemesis. No abdominal distention, + abdominal hernia  Genitourinary: no dysuria  Musculoskeletal: no pain or swelling of muscles  or joints  Integumentary: no rashes or jaundice  Neurological: no tremors or difficulties with memory  Psychiatric: Negative  Endocrine: no cold/heat intolerance, no excessive sweating or polyuria  Hematologic/Lymphatic: no easy bruising or bleeding  Allergic/Immunologic: negative, see allergy list        Physical Exam: (9 systems examined)     Vital Signs: BP 124/63 mmHg   Pulse 75   Temp(Src) 97.8 F (36.6 C) (Oral)   Ht 5\' 1"  (1.549 m)   Wt 53.524 kg (118 lb)   BMI 22.31 kg/m2   SpO2 99%     BMI: Body mass index is 22.31 kg/(m^2).     Systems:  GEN: appears frail, in no apparent distress. Alert and oriented times three, pleasant and cooperative.  HEENT: no sclera icterus, O/P: mmm no lesions  Lymph nodes:  negative lymph nodes  Pulmonary: moves air well bilaterally  Cardiovascular: regular rate & rhythm,  no murmurs, gallops or rubs, no peripheral edema  Gastrointestinal: BS x4, soft NTND, liver and spleen not palpable, no ascites, abdominal scar RUQ s/p cholecystectomy, + reducible RUQ hernia  Rectal and genitalia: Not examined  Neurologic including mental status: Alert & oriented x3. No encephalopathy or asterixis  Dermatologic:no jaundice, rash, or spiders     Laboratory:   Results for orders placed or performed in visit on 04/12/14   COMPREHENSIVE METABOLIC PANEL, BLOOD   Result Value Ref Range    Glucose 119 (H) 70 - 115 mg/dL    BUN 21 (H) 6 - 20 mg/dL    Creatinine 1.05 (H) 0.51 - 0.95 mg/dL    GFR 51 mL/min    Sodium 138 136 - 145 mmol/L    Potassium 3.7 3.5 - 5.1 mmol/L    Chloride 101 98 - 107 mmol/L    Bicarbonate 21 (L) 22 - 29 mmol/L    Anion Gap 16 (H) 7 - 15 mmol/L    Calcium 9.1 8.5 - 10.6 mg/dL    Total Protein 7.6 6.0 - 8.0 g/dL    Albumin 3.4 (L) 3.5 - 5.2 g/dL    Bilirubin, Tot 1.80 (H) <1.20 mg/dL    AST (SGOT) 32 0 - 32 U/L    ALT (SGPT) 38 (H) 0 - 33 U/L    Alkaline Phos 174 (H) 35 - 140 U/L   CBC WITH ADIFF, BLOOD   Result Value Ref Range    WBC 3.5 (L) 4.0 - 10.0 1000/mm3    RBC 3.84  (L) 3.90 - 5.20 mill/mm3    Hgb 12.7 11.2 - 15.7 gm/dL    Hct 35.0 34.0 - 45.0 %    MCV 91.1 79.0 - 95.0 um3    MCH 33.1 (H) 26.0 - 32.0 pgm    MCHC 36.3 (H) 32.0 - 36.0 %    RDW 14.7 (H) 12.0 - 14.0 %    MPV 11.0 9.4 - 12.4 fL    Plt Count 48 (L) 140 - 370 1000/mm3    Segs 74 (H) 34 - 71 %    Lymphocytes 15 (L) 19 - 53 %    Monocytes 9 5 - 12 %    Eosinophils 1 <1-4 %    ANC-Automated 2.6 1.6 - 7.0 1000/mm3    Abs Lymphs 0.5 (L) 0.8 - 3.1 1000/mm3    Abs  Monos 0.3 0.2 - 0.8 1000/mm3    Abs Eosinophils 0.1 <0.1-0.7 1000/mm3    Diff Type Automated     Plt Est Decreased     Microcytic 1+     Macrocytic 1+     Ovalocytes 1+    PROTHROMBIN TIME, BLOOD   Result Value Ref Range    PT,Patient 15.8 (H) 9.7 - 12.5 sec    INR 1.5    HEPATITIS C RNA, QUANT BLOOD   Result Value Ref Range    Hepatitis C RNA Quant Not Detected IU/mL        Endoscopy:   Endoscopic Diagnosis: 05/2013  1. Midline orophyarngeal   prominence at the arytenoid   2. Three columnns of grade 1 esophageal varices, no high   risk features   3. No gastric varices, mild portal hypertensive gastropathy   4. Few punctate clots in the antrum   5. 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 Continue propranolol 10 BID   2. ENT consultation to evaluate oropharyngeal findings   3. Repeat EGD in 1 year for varices screening     Imaging:  Limited Eovist MRI abdomen 01/2014    ASCITES: Mild perihepatic, perisplenic and pelvic     Negative Abbreviated MRI with gadoxetic acid (Eovist) for hepatocellular   cancer surveillance.    Numerous extensive pancreatic cysts throughout the pancreas, markedly enlarged  compared with CT from 2003 and demonstrating slow progressive increase in   size over time. 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 Given  the slow progressive increase in size, low-grade malignancy cannot be   excluded.     Recommendation: While the Eovist surveillance MRI is sufficient to evaluate   the liver,  discussion of the pancreatic lesions at the [facility] conference is   suggested ; MRI with an extracellular agent should be considered.     I have reviewed all lab documents in media and imaging and procedures in EPIC    History: She was admitted 3/24-3/28/2015 for GBS bacteremia. Unclear source. UA negative, CXR negative. Did have some diarrhea preceding this so perhaps translocation. No abd pain to suggest diverticulitis. She had a  PICC for 2 week course of CTX . She was prescribed Cipro 500 mg for SBP prophylaxis for 1 month and discontinued    Assessment and Plan: 73 yo hispanic female HCV GT 2 cirrhosis (achieved SVR)  fairly well compensated. EUS and several CT scans since 2005 are consistent with branch-type IPMN. She was seen by Dr. Allen Norris 02/2014 secondary slowly increasing pancreatic cysts (max 3cm). He felt she is low risk for cancer and very high risk for endoscopic complications. His recommendation was to continue to monitor with imaging.     * Unclear what current medications she is taking as she and her son Paula Manning are poor historians. This has been an ongoing problem and they were instructed in the past to bring a medication list to her appointments*    1) Cirrhosis: MELD 14; CPT B-7    2) CHC GT 2: achieved SVR  - HCV PCR negative (04/2014)    3) HCC surrveillance: Imaging from 12/2013  - no HCC; stable IPMN from 07/2013 but increased in size since 2003  - AFP 1.3 (01/2014)  - repeat surveillance in 07/2014. Radiology recommend extracellular agent to evaluate IPMN    4) EV surveillance: small EV. Remote historyy of banding ~ 14 years ago. No h/o UGI bleeding  - Grade  I EV. Previously reported she was taking Propranolol 20 mg but was not taking medication. She c/o some lightheadedness. Previously she was advised to remain off propranolol. External pharmacy shows recent refill of propanol. Unclear if she is taking medication  - prominence at the arytenoid seen with EGD. Seen by ENT and dx with large  osteophyte with no impact on swallowing.  - repeat surveillance ordered for 05/2014    5) Ventral hernia:   - abdominal binder  - evaluated by surgeon and declined for repair  - advised her to call or go to the ER if she develops pain with nausea, vomiting or fever in case the hernia is strangulated    6) Fluid retention: resolved  - reinforced low sodium diet <2,000 mg, avoiding processed food and limit eating out  - prior visit spironolactone was decreased to 100 (150) mg daily + furosemide decreased to 40 (60) mg. Unclear dose of current diuretics due to poor historian  -  Cont with cipro for SBP prophylaxis as this may have been the source of the prior bacteremia  - weights three times week. If increase weight > 5 lbs/week call provider    7) Diabetes: A1c 7.4.(10/2013) Managed by PCP  - on glipizide 10 mg twice daily  - on lantus 10 units qhs    8) H/o Pleural effusion: underlying emphysema  - continue with diuretics  - follow up with PCP  - advised if symptoms worsen (fever or increase SOB) to go to ED    9)  Nutrition:  - 2,000 low sodium diet  - previous spanish handouts given at prior appointment. Recommend Mrs Deliah Boston salt free for flavoring    10) Midline orophyarngeal prominence at the arytenoid (seen on EGD)  - evaluated by ENT 07/19/2013: large pharyngeal prominence represents a large osteophyte. No swallowing issues. Follow up with ENT prn    11) Return to clinic: 3 months (labs prior to appointment)    Advise patient and son Paula Manning to call hepatology nurse to reconcile medications.        Orlean Patten, NP

## 2014-04-18 NOTE — Patient Instructions (Addendum)
1. Please give copy of current medication list    2. Call our nurse Gae Bon to review medications    3. EGD instructions    As a reminder, please do not go to the lab without confirming lab orders are in the computer for you. If you have any questions you may call the lab at [phone].    If you have any questions or concerns please do not hesitate to call your care team at [phone].     Thank you,    Jeanette Caprice, MD  Associate Professor of Medicine  Director of [place], NP  Hepatology Nurse Practitioner  [place]    Earnest Conroy, [place]  Hepatology Nurse: refill medications or medical questions  [place]  D. Line [place]  Administrative Assistant: appointments, procedures or insurance issues  [place]  D. Line [phone]

## 2014-04-24 ENCOUNTER — Ambulatory Visit (INDEPENDENT_AMBULATORY_CARE_PROVIDER_SITE_OTHER): Payer: 59 | Admitting: Ophthalmology

## 2014-04-24 DIAGNOSIS — H409 Unspecified glaucoma: Secondary | ICD-10-CM

## 2014-05-23 ENCOUNTER — Emergency Department (HOSPITAL_COMMUNITY): Payer: 59

## 2014-05-23 ENCOUNTER — Emergency Department
Admission: EM | Admit: 2014-05-23 | Discharge: 2014-05-24 | Disposition: A | Discharge status: Home / Self Care | Payer: 59 | Attending: Emergency Medicine | Admitting: Emergency Medicine

## 2014-05-23 DIAGNOSIS — Z794 Long term (current) use of insulin: Secondary | ICD-10-CM | POA: Insufficient documentation

## 2014-05-23 DIAGNOSIS — Z0389 Encounter for observation for other suspected diseases and conditions ruled out: Secondary | ICD-10-CM

## 2014-05-23 DIAGNOSIS — J439 Emphysema, unspecified: Secondary | ICD-10-CM | POA: Insufficient documentation

## 2014-05-23 DIAGNOSIS — I10 Essential (primary) hypertension: Secondary | ICD-10-CM | POA: Insufficient documentation

## 2014-05-23 DIAGNOSIS — E1165 Type 2 diabetes mellitus with hyperglycemia: Principal | ICD-10-CM | POA: Insufficient documentation

## 2014-05-23 DIAGNOSIS — R739 Hyperglycemia, unspecified: Secondary | ICD-10-CM

## 2014-05-23 LAB — URINALYSIS
Bilirubin: NEGATIVE
Ketones: NEGATIVE
Nitrite: NEGATIVE
Protein: NEGATIVE
Specific Gravity: 1.003 (ref 1.002–1.030)
Urobilinogen: NEGATIVE
pH: 7 (ref 5.0–8.0)

## 2014-05-23 LAB — GLUCOSE (POCT)
Glucose (POCT): 390 mg/dL — ABNORMAL HIGH (ref 70–115)
Glucose (POCT): 425 mg/dL — ABNORMAL HIGH (ref 70–115)

## 2014-05-23 MED ORDER — INSULIN REGULAR HUMAN 100 UNIT/ML IJ SOLN
4.0000 [IU] | Freq: Once | INTRAMUSCULAR | Status: AC
Start: 2014-05-23 — End: 2014-05-23
  Administered 2014-05-23: 4 [IU] via SUBCUTANEOUS
  Filled 2014-05-23: qty 4

## 2014-05-23 NOTE — ED Notes (Signed)
Pt claims she was placed on a new type of insulin and her husband has been administering it to her. She claims yesterday she noticed that her husband was never pulling the cap off of the needle so she hasn't been getting insulin for five days. In the past five days she's been having increased urination and thirst, and has also been experiencing abdominal pain. She took 20 units of insulin today to try to lower her glucose but said her glucometer read high. Pt stable at this time, NSR but appears slightly irregular. Finger stick glucose 425. Pt says she's been feeling nauseas, dizzy, and has been light headed. Denies vomiting. Has been having headache but denies at this time.   On cardiac monitor, vitals stable, family at bedside. Will place IV and draw labs.

## 2014-05-23 NOTE — ED MD Progress Note (Signed)
ED Course  Rate 65, rhythm sinus, + PAC, no change from previous 11/01/2013    Labs- no AG, glucose elevated, LFT unchanged, troponin negative, UA- few WBC, rare bacteria + leuk esterase, negative nitrite. Pt denies symptoms of dysuria, frequency, will send for culture hold abx at this time.      Imaging-  cxr- no acute cardiopulmonary findings. No pleural effusions seen. Mild bibasal pleuro-parenchymal scarring.    4 U SC insulin given, glucose now in 300's. Pt has resolution of blurry vision. Will give additional 3U insulin, call pharmacy so they can instruct how to use the lantus pen.     Pt educated regarding her medication. All of her symptoms have resolved, she is ambulating in hallway asking to be discharged. She has follow up this AM with PCP    Medications administered:  Medications   insulin regular (HUMULIN,NOVOLIN) injection 4 Units (4 Units Subcutaneous Given 05/23/14 2349)   insulin regular (HUMULIN,NOVOLIN) injection 3 Units (3 Units Subcutaneous Given 05/24/14 0118)     Dispo Plan: DC with PCP follow up, continue insulin       Patient seen and discussed with ED attending    Edwyna Perfect, [facility] Resident PGY-2

## 2014-05-23 NOTE — ED Notes (Signed)
Returns from xray

## 2014-05-23 NOTE — ED Provider Notes (Signed)
Emergency Department Provider Note    Paula Manning  MRN: [id number]  DOB: 06-06-1941    The Date of Service for the Emergency Room encounter is 05/23/2014 10:21 PM     Pt seen and examined promptly, note entry delayed    Chief Complaint:  Chief Complaint   Patient presents with    High Blood Sugar     Pt bib granddaughter for reported high blood sugar, verbalizing machine unable to read sugar when tested; reports vision changes and feeling tired.  Nauseated+, denies v/d    Triage FSG 390       HPI:  Paula Manning is a 73 year old  female who has a past medical history of Migraine headache; Glaucoma; Ascites (06/29/2007); Pancreatic cyst (06/29/2007); Cirrhosis (06/24/2007); Osteopenia (06/24/2007); Emphysema (06/24/2007); EV (esophageal varices) (06/10/2007); Anemia; HCV (hepatitis C virus), diabetes mellitus; HTN and presents with c/o elevated blood sugar. Her insulin was changed 5-6 days ago. She used to draw up out of a vial with a small syringe but now has a new pen device. Her husband helps her administer but he has not been performing it correctly and the granddaughter suspects she has been without insulin for the past 5-6 days. She uses a walker since she feel 1 year ago. No falls since this time.     ROS:  Fever (-), Chills (-), Fatigue (+), Weakness(+ full body no one side more than the other), Headache (-), Vision changes (a little blurry over the past few days, + similar symptoms with elevated sugar in the past), Sore throat (-), Chest pain (-), Cough (-), SOB (-), DOE (-), Abd pain (+ epigastric pain, onset- a few days ago, reports always having this pain with high sugars, unable to describe but says it is not a strong pain just a slight bother), Nausea (+ today), Vomiting(-), Diarrhea (-), Consipation (-), Dark/Bloodystool (-), Dysuria (-), Hematuria (-), LE edema (+), Rash (-)     Home Medications:  Prior to Admission Medications   Prescriptions Last Dose Informant Patient Reported? Taking?    ALPHAGAN P 0.1 % ophthalmic solution   Yes No   CALCIUM CARBONATE 600 MG OR TABS   No No   Sig: 1 tablet 2 times daily   LUMIGAN 0.01 %   Yes No   VITAMIN D 400 UNIT OR CAPS   No No   Sig: 1 CAPSULE DAILY   alendronate (FOSAMAX) 70 MG tablet   Yes No   bacitracin 500 UNIT/GM ointment   No No   Sig: Apply small amount to affected area on face twice daily   ciprofloxacin (CIPRO) 500 MG tablet   No No   Sig: Take 1 tablet by mouth daily.   furosemide (LASIX) 20 MG tablet   Yes No   Sig: Take 2 tablets by mouth daily.   glipiZIDE (GLUCOTROL) 10 MG tablet   No No   Sig: Take 1 tablet by mouth 2 times daily.   insulin glargine (LANTUS) 100 UNIT/ML injection   No No   Sig: Inject 10 Units under the skin nightly.   multivitamin (MULTI-VITAMIN) tablet   Yes No   Sig: Take 1 tablet by mouth daily.   ranitidine (ZANTAC) 300 MG tablet   Yes No   Sig: Take 300 mg by mouth 2 times daily.   rifaximin (XIFAXAN) 550 MG tablet   Yes No   Sig: Take 550 mg by mouth every 12 hours.   spironolactone (ALDACTONE) 50 MG tablet  No No   Sig: Take 2 tablets by mouth daily.      Facility-Administered Medications: None   Insulin 20 lantus at night  6 units regular prior to eating    Allergies: Review of patient's allergies indicates no active allergies.    Past Medical History:   Past Medical History   Diagnosis Date    Migraine headache     Glaucoma     Ascites 06/29/2007     History of paracentesis x 2    Pancreatic cyst 06/29/2007    Cirrhosis 06/24/2007     Active on the liver transplant waiting list with a blood type of O positive. 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    Type II or unspecified type diabetes mellitus with peripheral circulatory disorders, not stated as uncontrolled(250.70) 06/10/2007    HTN 06/10/2007    HTN 06/10/2007             Past Surgical History:   Past Surgical History   Procedure Laterality Date    Pb laps surg cholecstc w/expl common duct  1970s     in [place];  open CCX    Pb cesarean delivery only      Pb cstoplasty/cstourtp plstc any       Bladder suspension    Egd procedure  06/01/2013     Procedure: GI EGD;  Surgeon: Jeanette Caprice, MD;  Location: [facility];  Service: Gastroenterology;;       Family History:  Denies CAD    Social History:   Tobacco: denies   Alcohol: denies  Drugs: denies    PHYSICAL EXAM  Vitals trend during ED stay:  Filed Vitals:    05/23/14 2147   BP: 134/46   Pulse: 72   Temp: 97.9 F (36.6 C)   Resp: 18   Height: 5\' 1"  (1.549 m)   Weight: 56.246 kg (124 lb)   SpO2: 96%     Vitals reviewed: wnl  Gen: elderly female, NAD  HEENT: NC/AT, sclera white, EOMI, PERRL, No rhinorrhea, MMM, posterior oropharynx clear without erythema or exudates    Neck: No stridor, no JVD   Lungs: CTA BL, no r,r,w, normal respiratory effort   Chest: irregular rate on monitor looks like PAC no g,r,m, no TTP, normal chest rise and fall   Abd: +BS, Soft, distended, non tender, no pulsatile masses, no ecchymosis   Back: No spinal tenderness, no deformity, no cva   Extr/MSK: No bony deformity, ROM intact, radial & DP pulses palpable and symmetrical, trace LE edema to feet and ankles    Skin: No rashes or lacerations. No signs of cellulitis   Neuro: CN II-XII intact, no facial asymmetry, no vertical or horizontal nystagmus, UE- 5/5 MS BL, SILT BL no asymmetrical findings, LE- 5/5 MS BL, SILT BL no asymmetrical findings. Pt speaking in full sentences without confusion, no dysmetria, no pronator drift, finger tapping performed without difficulty, gait is steady with her walker, per granddaughter is unchanged.   Psych: appropriate    Assessment & Plan  Pt is a 73 year old yo female with elevated sugar in context of recent change in lantus administration. Pt husband was not taught how to administer the new insulin pen or how  to dial it up  to the appropriate dose. She has some blurry vision, mild epigastric pain and fatigue. She reports having these symptoms in the past with her elevated sugars. Her glucometer has been reading Hi at home her cutoff level on her machine is unknown. She has a reassuring neuro exam, no focal findings, and a gait unchanged from baseline. Given her history of effusions with pac's on monitor will check cxr and ecg. Regarding her hyperglycemia will evaluate for AG or signs of DKA and administer insulin. Will hold on fluids at this time as pt has history of fluid retention 2/2 cirrhosis for which she takes lasix. If pt has electrolyte abnl fluids will be given. Will reassess for resolution of vision changes when glucose has been corrected.     - labs: cmp, troponin, cbc  - ECG   - imaging: cxr  - meds: insulin  - reassess    Edwyna Perfect, DO  [facility] Resident PGY-2        [place], Cyndra Numbers, DO  Resident  05/24/14 0015    Horton Marshall, MD  05/24/14 [phone]

## 2014-05-23 NOTE — ED Notes (Signed)
Pt to br with steady gait with family using own walker, non slip socks on.

## 2014-05-23 NOTE — ED Notes (Signed)
Blood samples collected/labeled and sent to the lab.

## 2014-05-23 NOTE — ED EKG Interpretation (Signed)
ED EKG Interpretation    EKG: Rate 65, rhythm sinus, + PAC, no change from previous 11/01/2013

## 2014-05-23 NOTE — ED Notes (Signed)
Pt in xray

## 2014-05-24 LAB — COMPREHENSIVE METABOLIC PANEL, BLOOD
ALT (SGPT): 36 U/L — ABNORMAL HIGH (ref 0–33)
AST (SGOT): 31 U/L (ref 0–32)
Albumin: 3.2 g/dL — ABNORMAL LOW (ref 3.5–5.2)
Alkaline Phos: 186 U/L — ABNORMAL HIGH (ref 35–140)
Anion Gap: 12 mmol/L (ref 7–15)
BUN: 23 mg/dL — ABNORMAL HIGH (ref 6–20)
Bicarbonate: 26 mmol/L (ref 22–29)
Bilirubin, Tot: 1.64 mg/dL — ABNORMAL HIGH (ref <1.20)
Calcium: 9.1 mg/dL (ref 8.5–10.6)
Chloride: 95 mmol/L — ABNORMAL LOW (ref 98–107)
Creatinine: 1.09 mg/dL — ABNORMAL HIGH (ref 0.51–0.95)
GFR: 49 mL/min
Glucose: 369 mg/dL — ABNORMAL HIGH (ref 70–115)
Potassium: 3.8 mmol/L (ref 3.5–5.1)
Sodium: 133 mmol/L — ABNORMAL LOW (ref 136–145)
Total Protein: 7.7 g/dL (ref 6.0–8.0)

## 2014-05-24 LAB — CBC WITH DIFF, BLOOD
ANC-Automated: 2.6 10*3/uL (ref 1.6–7.0)
Abs Lymphs: 0.4 10*3/uL — ABNORMAL LOW (ref 0.8–3.1)
Abs Monos: 0.3 10*3/uL (ref 0.2–0.8)
Eosinophils: 1 % (ref 1–4)
Hct: 33.3 % — ABNORMAL LOW (ref 34.0–45.0)
Hgb: 12 gm/dL (ref 11.2–15.7)
Lymphocytes: 13 % — ABNORMAL LOW (ref 19–53)
MCH: 33 pg — ABNORMAL HIGH (ref 26.0–32.0)
MCHC: 36 % (ref 32.0–36.0)
MCV: 91.5 um3 (ref 79.0–95.0)
MPV: 10.3 fL (ref 9.4–12.4)
Monocytes: 10 % (ref 5–12)
Plt Count: 45 10*3/uL — ABNORMAL LOW (ref 140–370)
RBC: 3.64 10*6/uL — ABNORMAL LOW (ref 3.90–5.20)
RDW: 14.2 % — ABNORMAL HIGH (ref 12.0–14.0)
Segs: 76 % — ABNORMAL HIGH (ref 34–71)
WBC: 3.4 10*3/uL — ABNORMAL LOW (ref 4.0–10.0)

## 2014-05-24 LAB — ECG 12-LEAD
ATRIAL RATE: 65 {beats}/min
P AXIS: 80 degrees
PR INTERVAL: 152 ms
QRS INTERVAL/DURATION: 74 ms
QT: 468 ms
QTC INTERVAL: 486 ms
R AXIS: 80 degrees
T AXIS: 67 degrees
VENTRICULAR RATE: 65 {beats}/min

## 2014-05-24 LAB — GLUCOSE (POCT): Glucose (POCT): 322 mg/dL — ABNORMAL HIGH (ref 70–115)

## 2014-05-24 LAB — TROPONIN T, BLOOD: Troponin T: <0.01 ng/mL (ref <0.01)

## 2014-05-24 MED ORDER — INSULIN REGULAR HUMAN 100 UNIT/ML IJ SOLN
3.0000 [IU] | Freq: Once | INTRAMUSCULAR | Status: AC
Start: 2014-05-24 — End: 2014-05-24
  Administered 2014-05-24: 3 [IU] via SUBCUTANEOUS
  Filled 2014-05-24: qty 3

## 2014-05-24 NOTE — ED Notes (Signed)
Pt given dc instructions written/verbal, told to follow up w/ pcp & return for worsening symptoms or concerns, also pt verbalized understanding/signed, pt left ed ambulating w/ steady gait

## 2014-05-24 NOTE — Discharge Instructions (Signed)
You are being discharged from the [facility] after being seen for hyperglycemia.    Medications: No change    Follow up with your primary care doctor as soon as possible regarding your visit to the emergency department. It is important to call your doctor as soon as possible to make an appointment to arrange proper follow up. If you have been referred to a [place] specialist the phone number for scheduling will be listed in your discharge packet. If you do not hear from the office scheduler within 1-2 business days please call the number to schedule an appointment as soon as possible.     Please return to the emergency department if your symptoms worsen, you are unable to eat or drink, you develop chest pain, shortness of breath, loss of consciousness, or any other concerning symptoms. The emergency department is open 24/7 and you should not hesitate to be evaluated if you have concerns about your health or safety.

## 2014-05-26 LAB — URINE CULTURE: Urine Culture Result: <10000

## 2014-06-01 NOTE — ED Follow-up Note (Signed)
Follow-up type: Callback       Routine ED Patient Call Back    Patient unable to be contacted, message left

## 2014-06-19 DIAGNOSIS — J439 Emphysema, unspecified: Secondary | ICD-10-CM | POA: Diagnosis present

## 2014-06-19 DIAGNOSIS — Z9119 Patient's noncompliance with other medical treatment and regimen: Secondary | ICD-10-CM | POA: Diagnosis present

## 2014-06-19 DIAGNOSIS — I81 Portal vein thrombosis: Secondary | ICD-10-CM | POA: Diagnosis present

## 2014-06-19 DIAGNOSIS — I313 Pericardial effusion (noninflammatory): Secondary | ICD-10-CM | POA: Diagnosis present

## 2014-06-19 DIAGNOSIS — H409 Unspecified glaucoma: Secondary | ICD-10-CM | POA: Diagnosis present

## 2014-06-19 DIAGNOSIS — R188 Other ascites: Secondary | ICD-10-CM | POA: Diagnosis present

## 2014-06-19 DIAGNOSIS — Z794 Long term (current) use of insulin: Secondary | ICD-10-CM

## 2014-06-19 DIAGNOSIS — B1920 Unspecified viral hepatitis C without hepatic coma: Secondary | ICD-10-CM | POA: Diagnosis present

## 2014-06-19 DIAGNOSIS — I8510 Secondary esophageal varices without bleeding: Secondary | ICD-10-CM | POA: Diagnosis present

## 2014-06-19 DIAGNOSIS — Z87891 Personal history of nicotine dependence: Secondary | ICD-10-CM

## 2014-06-19 DIAGNOSIS — Z7682 Awaiting organ transplant status: Secondary | ICD-10-CM

## 2014-06-19 DIAGNOSIS — K7460 Unspecified cirrhosis of liver: Secondary | ICD-10-CM | POA: Diagnosis present

## 2014-06-19 DIAGNOSIS — E1142 Type 2 diabetes mellitus with diabetic polyneuropathy: Secondary | ICD-10-CM | POA: Diagnosis present

## 2014-06-19 DIAGNOSIS — Y92239 Unspecified place in hospital as the place of occurrence of the external cause: Secondary | ICD-10-CM

## 2014-06-19 DIAGNOSIS — J95811 Postprocedural pneumothorax: Secondary | ICD-10-CM | POA: Diagnosis not present

## 2014-06-19 DIAGNOSIS — K766 Portal hypertension: Secondary | ICD-10-CM | POA: Diagnosis present

## 2014-06-19 DIAGNOSIS — E1151 Type 2 diabetes mellitus with diabetic peripheral angiopathy without gangrene: Secondary | ICD-10-CM | POA: Diagnosis present

## 2014-06-19 DIAGNOSIS — N179 Acute kidney failure, unspecified: Secondary | ICD-10-CM | POA: Diagnosis not present

## 2014-06-19 DIAGNOSIS — G43909 Migraine, unspecified, not intractable, without status migrainosus: Secondary | ICD-10-CM | POA: Diagnosis present

## 2014-06-19 DIAGNOSIS — E1165 Type 2 diabetes mellitus with hyperglycemia: Secondary | ICD-10-CM | POA: Diagnosis present

## 2014-06-19 DIAGNOSIS — J948 Other specified pleural conditions: Principal | ICD-10-CM | POA: Diagnosis present

## 2014-06-19 DIAGNOSIS — D649 Anemia, unspecified: Secondary | ICD-10-CM | POA: Diagnosis present

## 2014-06-19 DIAGNOSIS — R001 Bradycardia, unspecified: Secondary | ICD-10-CM | POA: Diagnosis not present

## 2014-06-19 DIAGNOSIS — I10 Essential (primary) hypertension: Secondary | ICD-10-CM | POA: Diagnosis present

## 2014-06-19 DIAGNOSIS — Y838 Other surgical procedures as the cause of abnormal reaction of the patient, or of later complication, without mention of misadventure at the time of the procedure: Secondary | ICD-10-CM | POA: Diagnosis not present

## 2014-06-19 DIAGNOSIS — M8580 Other specified disorders of bone density and structure, unspecified site: Secondary | ICD-10-CM | POA: Diagnosis present

## 2014-06-19 DIAGNOSIS — R0602 Shortness of breath: Secondary | ICD-10-CM

## 2014-06-19 NOTE — ED EKG Interpretation (Signed)
ED EKG Interpretation    EKG: Normal Sinus Rhythm with Normal Axis and unchanged from prior tracing, prolong qt.

## 2014-06-20 ENCOUNTER — Inpatient Hospital Stay
Admission: EM | Admit: 2014-06-20 | Discharge: 2014-07-04 | DRG: 981 | Disposition: A | Discharge status: Home / Self Care | Payer: 59 | Attending: Internal Medicine | Admitting: Internal Medicine

## 2014-06-20 DIAGNOSIS — J948 Other specified pleural conditions: Secondary | ICD-10-CM

## 2014-06-20 DIAGNOSIS — B1920 Unspecified viral hepatitis C without hepatic coma: Secondary | ICD-10-CM | POA: Diagnosis present

## 2014-06-20 DIAGNOSIS — J90 Pleural effusion, not elsewhere classified: Secondary | ICD-10-CM

## 2014-06-20 DIAGNOSIS — E119 Type 2 diabetes mellitus without complications: Secondary | ICD-10-CM

## 2014-06-20 DIAGNOSIS — R188 Other ascites: Secondary | ICD-10-CM

## 2014-06-20 DIAGNOSIS — R262 Difficulty in walking, not elsewhere classified: Secondary | ICD-10-CM

## 2014-06-20 DIAGNOSIS — Z8619 Personal history of other infectious and parasitic diseases: Secondary | ICD-10-CM

## 2014-06-20 DIAGNOSIS — E1140 Type 2 diabetes mellitus with diabetic neuropathy, unspecified: Secondary | ICD-10-CM

## 2014-06-20 DIAGNOSIS — K7460 Unspecified cirrhosis of liver: Secondary | ICD-10-CM | POA: Diagnosis present

## 2014-06-20 DIAGNOSIS — IMO0002 Reserved for concepts with insufficient information to code with codable children: Secondary | ICD-10-CM

## 2014-06-20 DIAGNOSIS — R0602 Shortness of breath: Secondary | ICD-10-CM

## 2014-06-20 DIAGNOSIS — I8500 Esophageal varices without bleeding: Secondary | ICD-10-CM

## 2014-06-20 DIAGNOSIS — I10 Essential (primary) hypertension: Secondary | ICD-10-CM

## 2014-06-20 DIAGNOSIS — E1165 Type 2 diabetes mellitus with hyperglycemia: Secondary | ICD-10-CM

## 2014-06-20 DIAGNOSIS — D649 Anemia, unspecified: Secondary | ICD-10-CM | POA: Diagnosis present

## 2014-06-20 DIAGNOSIS — E1151 Type 2 diabetes mellitus with diabetic peripheral angiopathy without gangrene: Secondary | ICD-10-CM

## 2014-06-20 DIAGNOSIS — C799 Secondary malignant neoplasm of unspecified site: Secondary | ICD-10-CM

## 2014-06-20 DIAGNOSIS — E1142 Type 2 diabetes mellitus with diabetic polyneuropathy: Secondary | ICD-10-CM

## 2014-06-20 LAB — URINALYSIS
Bilirubin: NEGATIVE
Hyaline Cast: >5 — AB (ref 0–?)
Ketones: NEGATIVE
Leuk Esterase: NEGATIVE
Nitrite: NEGATIVE
Protein: NEGATIVE
Specific Gravity: 1.013 (ref 1.002–1.030)
Urobilinogen: NEGATIVE
pH: 6 (ref 5.0–8.0)

## 2014-06-20 LAB — PROTHROMBIN TIME, BLOOD
INR: 1.5
PT,Patient: 16.1 s — ABNORMAL HIGH (ref 9.7–12.5)

## 2014-06-20 LAB — BODY FLUID CELL CT / DIFF
Basophils Fluid %: 0 %
Eosinophils Fluid %: 0 %
Fluid RBC mm3: 8000 uL
Fluid WBC mm3: 130 uL
Lymphocytes fluid %: 13 %
Macrophages fluid %: 85 %
Neutrophils Fluid %: 2 %

## 2014-06-20 LAB — COMPREHENSIVE METABOLIC PANEL, BLOOD
ALT (SGPT): 37 U/L — ABNORMAL HIGH (ref 0–33)
AST (SGOT): 35 U/L — ABNORMAL HIGH (ref 0–32)
Albumin: 3.2 g/dL — ABNORMAL LOW (ref 3.5–5.2)
Alkaline Phos: 183 U/L — ABNORMAL HIGH (ref 35–140)
Anion Gap: 12 mmol/L (ref 7–15)
BUN: 18 mg/dL (ref 6–20)
Bicarbonate: 23 mmol/L (ref 22–29)
Bilirubin, Tot: 1.84 mg/dL — ABNORMAL HIGH (ref <1.20)
Calcium: 8.8 mg/dL (ref 8.5–10.6)
Chloride: 96 mmol/L — ABNORMAL LOW (ref 98–107)
Creatinine: 1.09 mg/dL — ABNORMAL HIGH (ref 0.51–0.95)
GFR: 49 mL/min
Glucose: 384 mg/dL — ABNORMAL HIGH (ref 70–115)
Potassium: 4 mmol/L (ref 3.5–5.1)
Sodium: 131 mmol/L — ABNORMAL LOW (ref 136–145)
Total Protein: 7.9 g/dL (ref 6.0–8.0)

## 2014-06-20 LAB — CBC WITH DIFF, BLOOD
ANC-Automated: 2.7 10*3/uL (ref 1.6–7.0)
Abs Lymphs: 0.6 10*3/uL — ABNORMAL LOW (ref 0.8–3.1)
Abs Monos: 0.4 10*3/uL (ref 0.2–0.8)
Basophils: 1 % (ref 0–2)
Eosinophils: 1 % (ref 1–4)
Hct: 36.4 % (ref 34.0–45.0)
Hgb: 13.3 gm/dL (ref 11.2–15.7)
Lymphocytes: 16 % — ABNORMAL LOW (ref 19–53)
MCH: 33.2 pg — ABNORMAL HIGH (ref 26.0–32.0)
MCHC: 36.5 % — ABNORMAL HIGH (ref 32.0–36.0)
MCV: 90.8 um3 (ref 79.0–95.0)
MPV: 11 fL (ref 9.4–12.4)
Monocytes: 10 % (ref 5–12)
Plt Count: 50 10*3/uL — ABNORMAL LOW (ref 140–370)
RBC: 4.01 10*6/uL (ref 3.90–5.20)
RDW: 14.9 % — ABNORMAL HIGH (ref 12.0–14.0)
Segs: 72 % — ABNORMAL HIGH (ref 34–71)
WBC: 3.8 10*3/uL — ABNORMAL LOW (ref 4.0–10.0)

## 2014-06-20 LAB — PRO BNP, BLOOD: BNPP: 489 pg/mL (ref 0–899)

## 2014-06-20 LAB — CPK-CREATINE PHOSPHOKINASE, BLOOD: CPK: 82 U/L (ref 0–175)

## 2014-06-20 LAB — ECG 12-LEAD
ATRIAL RATE: 96 {beats}/min
P AXIS: 61 degrees
PR INTERVAL: 138 ms
QRS INTERVAL/DURATION: 66 ms
QT: 400 ms
QTC INTERVAL: 505 ms
R AXIS: 79 degrees
T AXIS: 26 degrees
VENTRICULAR RATE: 96 {beats}/min

## 2014-06-20 LAB — GLYCOSYLATED HGB(A1C), BLOOD: Glyco Hgb (A1C): 8.1 % — ABNORMAL HIGH (ref 4.8–5.9)

## 2014-06-20 LAB — CKMB+INDEX (NO CPK), BLOOD
CK-MB INDEX: 3.2 %
CK-MB: 2.6 ng/mL (ref 0.0–2.8)

## 2014-06-20 LAB — GLUCOSE (POCT)
GLUCOSE (POCT): 237 mg/dL — ABNORMAL HIGH (ref 70–115)
Glucose (POCT): 341 mg/dL — ABNORMAL HIGH (ref 70–115)
Glucose (POCT): 287 mg/dL — ABNORMAL HIGH (ref 70–115)
Glucose (POCT): 373 mg/dL — ABNORMAL HIGH (ref 70–115)

## 2014-06-20 LAB — TROPONIN T, BLOOD
TROPONIN T: <0.01 ng/mL (ref <0.01)
Troponin T: <0.01 ng/mL (ref <0.01)

## 2014-06-20 LAB — ALBUMIN, OTHER: Albumin, Other: 0.5 g/dL

## 2014-06-20 LAB — LIPASE, BLOOD: Lipase: 60 U/L (ref 13–60)

## 2014-06-20 MED ORDER — DEXTROSE 50 % IV SOLN
12.5000 g | INTRAVENOUS | Status: DC | PRN
Start: 2014-06-20 — End: 2014-06-20

## 2014-06-20 MED ORDER — SPIRONOLACTONE 100 MG OR TABS
100.0000 mg | ORAL_TABLET | Freq: Every day | ORAL | Status: DC
Start: 2014-06-21 — End: 2014-06-24
  Administered 2014-06-21 – 2014-06-24 (×4): 100 mg via ORAL
  Filled 2014-06-20 (×4): qty 1

## 2014-06-20 MED ORDER — RIFAXIMIN 550 MG PO TABS
550.0000 mg | ORAL_TABLET | Freq: Two times a day (BID) | ORAL | Status: DC
Start: 2014-06-20 — End: 2014-07-04
  Administered 2014-06-20 – 2014-07-04 (×29): 550 mg via ORAL
  Filled 2014-06-20 (×29): qty 1

## 2014-06-20 MED ORDER — NALOXONE HCL 0.4 MG/ML IJ SOLN
0.1000 mg | INTRAMUSCULAR | Status: DC | PRN
Start: 2014-06-20 — End: 2014-07-04

## 2014-06-20 MED ORDER — FAMOTIDINE 20 MG OR TABS
20.0000 mg | ORAL_TABLET | Freq: Every day | ORAL | Status: DC
Start: 2014-06-20 — End: 2014-07-04
  Administered 2014-06-20 – 2014-07-04 (×15): 20 mg via ORAL
  Filled 2014-06-20 (×15): qty 1

## 2014-06-20 MED ORDER — SPIRONOLACTONE 100 MG OR TABS
100.0000 mg | ORAL_TABLET | Freq: Every day | ORAL | Status: DC
Start: 2014-06-20 — End: 2014-06-20

## 2014-06-20 MED ORDER — MULTI-VITAMINS PO TABS
1.0000 | ORAL_TABLET | Freq: Every day | ORAL | Status: DC
Start: 2014-06-20 — End: 2014-06-21
  Administered 2014-06-20 – 2014-06-21 (×2): 1 via ORAL
  Filled 2014-06-20 (×2): qty 1

## 2014-06-20 MED ORDER — CIPROFLOXACIN HCL 500 MG OR TABS
500.0000 mg | ORAL_TABLET | Freq: Every day | ORAL | Status: DC
Start: 2014-06-20 — End: 2014-06-20

## 2014-06-20 MED ORDER — ONDANSETRON HCL 4 MG/2ML IV SOLN
4.0000 mg | Freq: Four times a day (QID) | INTRAMUSCULAR | Status: DC | PRN
Start: 2014-06-20 — End: 2014-07-04
  Administered 2014-06-29: 4 mg via INTRAVENOUS
  Filled 2014-06-20 (×5): qty 2

## 2014-06-20 MED ORDER — SODIUM CHLORIDE 0.9 % IV SOLN
INTRAVENOUS | Status: DC | PRN
Start: 2014-06-20 — End: 2014-07-04

## 2014-06-20 MED ORDER — GLUCOSE 4 GM PO CHEW (CUSTOM)
4.0000 | CHEWABLE_TABLET | ORAL | Status: DC | PRN
Start: 2014-06-20 — End: 2014-07-04

## 2014-06-20 MED ORDER — INSULIN LISPRO (HUMAN) 100 UNIT/ML SC SOLN (CUSTOM)
1.0000 [IU] | Freq: Four times a day (QID) | INTRAMUSCULAR | Status: DC
Start: 2014-06-20 — End: 2014-06-20
  Administered 2014-06-20: 09:00:00 2 [IU] via SUBCUTANEOUS
  Filled 2014-06-20: qty 3

## 2014-06-20 MED ORDER — INSULIN GLARGINE 100 UNIT/ML SC SOLN
15.0000 [IU] | Freq: Every morning | SUBCUTANEOUS | Status: DC
Start: 2014-06-20 — End: 2014-06-20
  Administered 2014-06-20: 15 [IU] via SUBCUTANEOUS
  Filled 2014-06-20: qty 15

## 2014-06-20 MED ORDER — SODIUM CHLORIDE 0.9 % IJ SOLN (CUSTOM)
3.0000 mL | Freq: Three times a day (TID) | INTRAMUSCULAR | Status: DC
Start: 2014-06-20 — End: 2014-07-04
  Administered 2014-06-20 – 2014-07-04 (×38): 3 mL via INTRAVENOUS

## 2014-06-20 MED ORDER — FUROSEMIDE 10 MG/ML IJ SOLN
40.0000 mg | Freq: Every day | INTRAMUSCULAR | Status: DC
Start: 2014-06-21 — End: 2014-06-20

## 2014-06-20 MED ORDER — DEXTROSE 50 % IV SOLN
12.5000 g | INTRAVENOUS | Status: DC | PRN
Start: 2014-06-20 — End: 2014-07-04

## 2014-06-20 MED ORDER — CALCIUM CARBONATE 1250 MG/5ML OR SUSP
600.0000 mg | Freq: Two times a day (BID) | ORAL | Status: DC
Start: 2014-06-20 — End: 2014-07-04
  Administered 2014-06-20 – 2014-07-04 (×28): 600 mg via ORAL
  Filled 2014-06-20 (×29): qty 5

## 2014-06-20 MED ORDER — INSULIN GLARGINE 100 UNIT/ML SC SOLN
15.0000 [IU] | Freq: Every morning | SUBCUTANEOUS | Status: DC
Start: 2014-06-21 — End: 2014-06-21
  Administered 2014-06-21: 15 [IU] via SUBCUTANEOUS
  Filled 2014-06-20: qty 15

## 2014-06-20 MED ORDER — CIPROFLOXACIN HCL 500 MG OR TABS
500.0000 mg | ORAL_TABLET | ORAL | Status: DC
Start: 2014-06-21 — End: 2014-07-04
  Administered 2014-06-21 – 2014-07-04 (×14): 500 mg via ORAL
  Filled 2014-06-20 (×15): qty 1

## 2014-06-20 MED ORDER — GLUCAGON HCL (RDNA) 1 MG IJ SOLR
1.0000 mg | Freq: Once | INTRAMUSCULAR | Status: DC | PRN
Start: 2014-06-20 — End: 2014-06-20

## 2014-06-20 MED ORDER — FUROSEMIDE 10 MG/ML IJ SOLN
20.0000 mg | Freq: Two times a day (BID) | INTRAMUSCULAR | Status: DC
Start: 2014-06-20 — End: 2014-06-22
  Administered 2014-06-20 – 2014-06-22 (×5): 20 mg via INTRAVENOUS
  Filled 2014-06-20 (×4): qty 4

## 2014-06-20 MED ORDER — SPIRONOLACTONE 100 MG OR TABS
100.0000 mg | ORAL_TABLET | Freq: Once | ORAL | Status: AC
Start: 2014-06-20 — End: 2014-06-20
  Administered 2014-06-20: 100 mg via ORAL
  Filled 2014-06-20: qty 1

## 2014-06-20 MED ORDER — DEXTROSE (DIABETIC USE) 40 % OR GEL
1.0000 | ORAL | Status: DC | PRN
Start: 2014-06-20 — End: 2014-06-20

## 2014-06-20 MED ORDER — DEXTROSE (DIABETIC USE) 40 % OR GEL
1.0000 | ORAL | Status: DC | PRN
Start: 2014-06-20 — End: 2014-07-04

## 2014-06-20 MED ORDER — INSULIN LISPRO (HUMAN) 100 UNIT/ML SC SOLN (CUSTOM)
1.0000 [IU] | Freq: Four times a day (QID) | INTRAMUSCULAR | Status: DC
Start: 2014-06-20 — End: 2014-06-23
  Administered 2014-06-20 (×2): 4 [IU] via SUBCUTANEOUS
  Administered 2014-06-20 – 2014-06-21 (×3): 3 [IU] via SUBCUTANEOUS
  Administered 2014-06-21: 13:00:00 5 [IU] via SUBCUTANEOUS
  Administered 2014-06-22: 22:00:00 2 [IU] via SUBCUTANEOUS
  Administered 2014-06-22: 13:00:00 3 [IU] via SUBCUTANEOUS
  Administered 2014-06-22 (×2): 1 [IU] via SUBCUTANEOUS
  Filled 2014-06-20: qty 3
  Filled 2014-06-20: qty 4
  Filled 2014-06-20: qty 1
  Filled 2014-06-20: qty 3
  Filled 2014-06-20: qty 4
  Filled 2014-06-20 (×2): qty 5
  Filled 2014-06-20: qty 3
  Filled 2014-06-20: qty 1
  Filled 2014-06-20: qty 2

## 2014-06-20 MED ORDER — GLUCAGON HCL (RDNA) 1 MG IJ SOLR
1.0000 mg | Freq: Once | INTRAMUSCULAR | Status: DC | PRN
Start: 2014-06-20 — End: 2014-07-04

## 2014-06-20 MED ORDER — SODIUM CHLORIDE 0.9 % IJ SOLN (CUSTOM)
3.0000 mL | INTRAMUSCULAR | Status: DC | PRN
Start: 2014-06-20 — End: 2014-07-04
  Administered 2014-06-29 – 2014-07-04 (×3): 3 mL via INTRAVENOUS

## 2014-06-20 MED ORDER — FUROSEMIDE 10 MG/ML IJ SOLN
40.0000 mg | Freq: Once | INTRAMUSCULAR | Status: AC
Start: 2014-06-20 — End: 2014-06-20
  Administered 2014-06-20: 40 mg via INTRAVENOUS
  Filled 2014-06-20: qty 4

## 2014-06-20 MED ORDER — FUROSEMIDE 10 MG/ML IJ SOLN
40.0000 mg | Freq: Every day | INTRAMUSCULAR | Status: DC
Start: 2014-06-20 — End: 2014-06-20

## 2014-06-20 MED ORDER — GLUCOSE 4 GM PO CHEW (CUSTOM)
4.0000 | CHEWABLE_TABLET | ORAL | Status: DC | PRN
Start: 2014-06-20 — End: 2014-06-20

## 2014-06-20 NOTE — Plan of Care (Signed)
Problem: Breathing Pattern - Ineffective  Goal: Respiratory rate, rhythm and depth return to baseline  Outcome: Met  Respiration regular and unlabored, pt on O2 via nasal cannula for comfort. Pt with dyspnea with exertion during the day per report.

## 2014-06-20 NOTE — ED Procedure Note (Signed)
Procedure Note  Paracentesis  Date/Time: 06/20/2014 7:24 AM  Performed by: Marylou Mccoy  Authorized by: Virgia Land  Consent: Verbal consent obtained. Written consent obtained.  Risks and benefits: risks, benefits and alternatives were discussed  Consent given by: patient  Patient identity confirmed: verbally with patient  Time out: Immediately prior to procedure a "time out" was called to verify the correct patient, procedure, equipment, support staff and site/side marked as required.  Initial or subsequent exam: initial  Procedure purpose: diagnostic  Indications: abdominal discomfort secondary to ascites and suspected peritonitis  Anesthesia: local infiltration  Local anesthetic: lidocaine 1% with epinephrine  Anesthetic total: 5 ml  Patient sedated: no  Preparation: Patient was prepped and draped in the usual sterile fashion.  Needle gauge: 22  Ultrasound guidance: yes  Puncture site: left lower quadrant  Fluid removed: 0.2(litres)  Fluid appearance: bloody and serous  Dressing: pressure dressing  Patient tolerance: Patient tolerated the procedure well with no immediate complications

## 2014-06-20 NOTE — ED Notes (Signed)
Pt. Able to eat breakfast per MD ok.

## 2014-06-20 NOTE — ED Notes (Signed)
Pt to triage via ambulation, steady gait.  IV placed and bloods drawn and sent to lab.  Pt given urine specimen cup with instructions for cc, verbalized understanding.  Reassurance provided.  Pt remains awaiting room for further MD evaluation (ED acuity high); pt very pleasant verbalizes understanding.  Pt returned to waiting room, steady gait; family by side for support.

## 2014-06-20 NOTE — ED Notes (Signed)
PT ambulatory to restroom with walker without difficulty.

## 2014-06-20 NOTE — H&P (Signed)
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 +2 pillow orthopnea. Denies PND. +LE edema. Reports that she is taking her diuretics, hasn't missed any doses. Denies cough, fevers, chills. States appetite is unchanged but has been eating out "a lot" including salty foods. No abdominal pain. Denies abdominal distention. +nausea, chronic and unchanged. No emesis. No diarrhea or constipation, no hematochezia or melena. Endorses dysuria over the past two days, +urinary urgency and frequency. No hematuria. Reports pleuritic chest pain that occurred five days ago, currently chest pain free.       Past Medical and Surgical History:  Past Medical History   Diagnosis Date    Migraine headache     Glaucoma     Ascites 06/29/2007     History of paracentesis x 2    Pancreatic cyst 06/29/2007    Cirrhosis 06/24/2007     Active on the liver transplant waiting list with a blood type of O positive. 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    Type II or unspecified type diabetes mellitus with peripheral circulatory disorders, not stated as uncontrolled(250.70) 06/10/2007    HTN 06/10/2007    HTN 06/10/2007     Past Surgical History   Procedure Laterality Date    Pb laps surg cholecstc w/expl common duct  1970s     in [place];  open CCX    Pb cesarean delivery only      Pb cstoplasty/cstourtp plstc any       Bladder suspension    Egd procedure  06/01/2013     Procedure: GI  EGD;  Surgeon: Jeanette Caprice, MD;  Location: [facility];  Service: Gastroenterology;;       Allergies:  No Known Allergies    Medications:  No current facility-administered medications on file prior to encounter.     Current Outpatient Prescriptions on File Prior to Encounter   Medication Sig Dispense Refill    alendronate (FOSAMAX) 70 MG tablet       ALPHAGAN P 0.1 % ophthalmic solution       bacitracin 500 UNIT/GM ointment Apply small amount to affected area on face twice daily 1 Tube 1    CALCIUM CARBONATE 600 MG OR TABS 1 tablet 2 times daily one month 11    ciprofloxacin (CIPRO) 500 MG tablet Take 1 tablet by mouth daily. 30 tablet 5    furosemide (LASIX) 20 MG tablet Take 2 tablets by mouth daily. 90 tablet 0    glipiZIDE (GLUCOTROL) 10 MG tablet Take 1 tablet by mouth 2 times daily. 60 tablet 3    insulin glargine (LANTUS) 100 UNIT/ML injection Inject 10 Units under the skin nightly. 1 Vial 2    LUMIGAN 0.01 %       multivitamin (MULTI-VITAMIN) tablet Take 1 tablet by mouth daily.  ranitidine (ZANTAC) 300 MG tablet Take 300 mg by mouth 2 times daily.      rifaximin (XIFAXAN) 550 MG tablet Take 550 mg by mouth every 12 hours.      spironolactone (ALDACTONE) 50 MG tablet Take 2 tablets by mouth daily. 60 tablet 3    VITAMIN D 400 UNIT OR CAPS 1 CAPSULE DAILY 30 11       Social History:  History     Social History    Marital Status: Married     Spouse Name: N/A     Number of Children: N/A    Years of Education: N/A     Occupational History    retired      Social History Main Topics    Smoking status: Former Smoker -- 10 years     Quit date: 11/13/2012    Smokeless tobacco: Never Used    Alcohol Use: No    Drug Use: No    Sexual Activity: Not on file     Other Topics Concern    Not on file     Social History Narrative    No narrative on file       Family History:  Denies hx of CAD, HTN, CKD, liver disease     Review of Systems:  12 point ROS have been reviewed and are otherwise  negative except as stated here or in the HPI    Physical Exam:  BP 153/66 mmHg   Pulse 86   Temp(Src) 98.5 F (36.9 C)   Resp 16   Ht 5' 1" (1.549 m)   Wt 56.246 kg (124 lb)   BMI 23.44 kg/m2   SpO2 97%  GEN: elderly, thin female in NAD. Cooperative, responding appropriately.    HEENT:  NCAT, neck supple, anicteric sclera, EOMI, OP clear   CV: irregular, no m/r/g   Pulmonary: decreased BS over entire R lung fields   Gastrointestinal: +NABS, soft but distended, + fluid wave, TTP RLQ, no rebound or guarding, abdominal scar RUQ s/p cholecystectomy, + reducible RUQ hernia  Neuro: no asterixis     Labs and Other Data:  Lab Results   Component Value Date    NA 131* 06/20/2014    K 4.0 06/20/2014    CL 96* 06/20/2014    BICARB 23 06/20/2014    BUN 18 06/20/2014    CREAT 1.09* 06/20/2014    GLU 384* 06/20/2014    [place] 8.8 06/20/2014     Lab Results   Component Value Date    WBC 3.8* 06/20/2014    HGB 13.3 06/20/2014    HCT 36.4 06/20/2014    PLT 50* 06/20/2014    SEG 72* 06/20/2014    LYMPHS 16* 06/20/2014    MONOS 10 06/20/2014    EOS 1 06/20/2014     Lab Results   Component Value Date    AST 35* 06/20/2014    ALT 37* 06/20/2014    ALK 183* 06/20/2014    TBILI 1.84* 06/20/2014    TP 7.9 06/20/2014    ALB 3.2* 06/20/2014     Lab Results   Component Value Date    TROPONIN <0.01 06/20/2014     No results found for: PHUA, SGUA, GLUCOSEUA, KETONEUA, BLOODUA, PROTEINUA, LEUKESTUA, NITRITEUA, [place], [place], [place], [place], [place]     CXR: Large right-sided pleural effusion with associated right base opacity.    Endoscopy:   Endoscopic Diagnosis: 05/2013  1. Midline orophyarngeal   prominence at the arytenoid  2. Three columnns of grade 1 esophageal varices, no high   risk features   3. No gastric varices, mild portal hypertensive gastropathy   4. Few punctate clots in the antrum   5. 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 Continue propranolol 10 BID   2. ENT consultation  to evaluate oropharyngeal findings   3. Repeat EGD in 1 year for varices screening     Imaging:  Limited Eovist MRI abdomen 01/2014    ASCITES: Mild perihepatic, perisplenic and pelvic     Negative Abbreviated MRI with gadoxetic acid (Eovist) for hepatocellular   cancer surveillance.    Numerous extensive pancreatic cysts throughout the pancreas, markedly enlarged  compared with CT from 2003 and demonstrating slow progressive increase in   size over time. 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 Given  the slow progressive increase in size, low-grade malignancy cannot be   excluded.     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     1. Sob 2/2 right pleural effusion: likely due to dietary indiscretion   Diurese with 40 IV lasix, aldactone 100   May need thoracentesis if doesn't respond to diuresis (has had this in the past)    Strict I/O  Low Na diet   Given afebrile, no leukocytosis will defer abx. Consider repeat CXR after fluid removal     2. Abdominal pain:   Have asked ER to perform paracentesis to evaluate for SBP   Consider abdominal ultrasound to evaluate for PVT pending paracentesis results   Hepatology consult     3. CP: atypical, EKG without acute st/t wave changes   -trend CM    4. Dysuria:   -check UA     5. Hep C Cirrhosis: s/p treatment of Hep C with SVR achieved   Ascites-diuretics per above   HE-mental status clear, cont rifaxamin  EV-sm distal EV 5/13: propanolol dc'd at last admission 2/2 bradycardia   Continue cipro for SBP ppx   HCC: imaging done in May     6. DM: on lantus and glipizide as outpatient.   Cont lantus, SSI     FEN: low sodium diet   PPx: SCDs, Hold chemoppx given thrombocytopenia  FC/FC     This plan and alternatives have been discussed with the patient and/or  surrogate.  This plan and alternatives have been discussed with the patient and/or surrogate.  The patient's primary care physician or clinic has not been contacted regarding this admission.    Note Author: Van Clines, 06/20/2014, 5:30 AM      .

## 2014-06-20 NOTE — ED MD Progress Note (Signed)
Medicine attending felt she had RLQ abd ttp, so will perform diagnostic paracentesis prior to admission.

## 2014-06-20 NOTE — ED Notes (Signed)
Received report from Erin, RN.

## 2014-06-20 NOTE — ED Notes (Signed)
Bed: 07B  Expected date:   Expected time:   Means of arrival:   Comments:  triage

## 2014-06-20 NOTE — Progress Notes (Signed)
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      # Hep C Cirrhosis:  - continue diuresis with Lasix IV: trial of 40 IV once this morning, will monitor urine output, I/O check at midnight, goal negative -1 to 2 liters  - continue Aldactone 100 mg PO QDAY  - continue Cipro 500 mg PO QDAY for SBP prophylaxis   - no SBP on diagnostic paracentesis   - appreciate hepatology assistance and recommendations  - appreciate liver service assuming care for this patient     # Diabetes:  - Lantus 15 units QAM  - Aspart sliding scale     The patient was discussed and examined with the attending physician, Dr. Galen Daft.    Denny Peon, MD  Internal Medicine, PGY-2  Pager: [phone]

## 2014-06-20 NOTE — ED MD Progress Note (Signed)
S/o pt.   Ascites.   Large pleural effusion.   Paracentesis pending.   Admitted to med surg.   Stable otherwise.

## 2014-06-20 NOTE — ED Notes (Signed)
Aldactone arrived from Pharmacy

## 2014-06-20 NOTE — Plan of Care (Signed)
Problem: Pain - Acute  Goal: Control of acute pain  Outcome: Met  Pt with no complaints of pain this shift, will continue to monitor.        Problem: Mobility - Impaired  Goal: Able to ambulate within specified parameters  Outcome: Met  Pt ambulating safely with rolling walker from home. Wearing non skid footwear, bed in locked and lowest position at all times.     Problem: Discharge Planning  Goal: Participation in care planning  Outcome: Met  Pt transfer from ED, orient to room per protocol. Daughter at bedside to help interpret, pt primarily spanish speaking. IV lasix 62m admin per MD order and foley dc'd. Pt voiding on her own. Pt uses 1 Liter NC for comfort, experience DOE. LS dim. Will continue to monitor resp status.     Problem: Fluid Volume Excess  Goal: Absence of edema; peripheral and/or pulmonary  Outcome: Not Met  Pt abdomen with ascites, left side with bandage CDI from tap this morning. Pt receiving lasix per order, good urine output. Monitoring pt intake and output, advised pt to not overdue her fluid intake, she verbalizes understanding. +1 pitting edema bilat LE.   Goal: Vital signs within specified parameters  Outcome: Met

## 2014-06-20 NOTE — ED Notes (Signed)
Pt sleeping, breathing even/non-labored; family by side.  Pt awaiting room for further MD evaluation (ED acuity high).

## 2014-06-20 NOTE — ED Notes (Addendum)
Pt. Resting and in no apparent distress at this time. Skin is warm dry and intact. IV flushed with 61ml Normal Saline. Arousable to voice and speaks in clear 3-4 word sentences.     Called nurse for [facility] and gave report to RN.

## 2014-06-20 NOTE — ED Notes (Signed)
06/20/2014 12:26 AM Paula Manning    An EKG was handed to Dr. Jacelyn Pi.

## 2014-06-20 NOTE — ED Notes (Signed)
Pt. Resting in bed and in no apparent distress at this time. Patient denies pain. Breathing is unlabored and pt. Reports that she is having "a little more" difficulty breathing. Breathe sounds clear. Even bilateral lung expansion.     Call bell within reach. Family at the bedside.

## 2014-06-20 NOTE — ED Notes (Signed)
Pharmacy was called. Spironolactone is ready at this time.

## 2014-06-20 NOTE — ED Notes (Signed)
Patient resting in bed and in no apparent distress. Denies pain at her abdomen or head at this time.     Report a "menos" amount of difficulty breathing. Patient is breathing unlabored and even bilaterally at this time. Skin is normal color for ethnicity- pink warm and dry.     Son at the bedside.     Dressing site from the paracentesis is intact, dry and no discharge noted.

## 2014-06-20 NOTE — Plan of Care (Signed)
Problem: Pain - Acute  Goal: Control of acute pain  Outcome: Met  Denies pain, pt's abdomen distended. Needs assist in repositioning. Pt non English speaking, daughter at bedside to interpret. Language line available.    Problem: Mobility - Impaired  Goal: Able to ambulate within specified parameters  Outcome: Met  Pt was walking around the hall using her walker. Pt denies dizziness. She is wearing non skid socks. When in bed, pt's daughter said not to put the bed alarm as she is staying in the room with her. Call bell given to pt, instructed her how to use it in case they need the RN    Problem: Discharge Planning  Goal: Participation in care planning  Outcome: Met  Pt with active family members who care for her. No discharge order yet        Problem: Fluid Volume Excess  Goal: Absence of edema; peripheral and/or pulmonary  Outcome: Not Met  BLE swelling noted, elevated over pillows. Pt has ascites. Abdomen was tapped earlier, dsg on site intact.

## 2014-06-20 NOTE — ED Provider Notes (Signed)
Emergency Department Note  [facility] electronic medical record reviewed for pertinent medical history.     Nursing Triage Note:   Chief Complaint   Patient presents with    Shortness of Breath     Pt verbalizes sob x3 days, worsening today endorsing hx of fluid retention, verbalizes having on/off generalized cp radiating to back.  Pt speaking full sentences.Pt also c/o on/off generalized abd pain x2 days, denies discomfort at this time  Denies n/v/d.  EKG being done in triage.         HPI:   73 year old female with a PMH significant for HCV cirrhosis, HTN, and DM presents with sob and cp. Pt reports last 5 days gradually developing R sided cp, radiates to back, constant, pleuritic, constant, moderate, better with nothing, similar to past events of pleural effusion. Gets thoracenteses approx 1 per year. Associated symptoms include dry cough. Denies fevers, chills, immobilization prior to onset, unilateral leg swelling.   Pt has ascites managed by medications. No abd pain, nausea, or vomiting. No diarrhea.      HPI    Past Medical History   Diagnosis Date    Migraine headache     Glaucoma     Ascites 06/29/2007     History of paracentesis x 2    Pancreatic cyst 06/29/2007    Cirrhosis 06/24/2007     Active on the liver transplant waiting list with a blood type of O positive. 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    Type II or unspecified type diabetes mellitus with peripheral circulatory disorders, not stated as uncontrolled(250.70) 06/10/2007    HTN 06/10/2007    HTN 06/10/2007       Past Surgical History   Procedure Laterality Date    Pb laps surg cholecstc w/expl common duct  1970s     in [place];  open CCX    Pb cesarean delivery only      Pb cstoplasty/cstourtp plstc any       Bladder suspension    Egd procedure   06/01/2013     Procedure: GI EGD;  Surgeon: Jeanette Caprice, MD;  Location: [facility];  Service: Gastroenterology;;         History   Substance Use Topics    Smoking status: Former Smoker -- 10 years     Quit date: 11/13/2012    Smokeless tobacco: Never Used    Alcohol Use: No       Medications:   Prior to Admission Medications   Prescriptions Last Dose Informant Patient Reported? Taking?   ALPHAGAN P 0.1 % ophthalmic solution   Yes No   CALCIUM CARBONATE 600 MG OR TABS   No No   Sig: 1 tablet 2 times daily   LUMIGAN 0.01 %   Yes No   VITAMIN D 400 UNIT OR CAPS   No No   Sig: 1 CAPSULE DAILY   alendronate (FOSAMAX) 70 MG tablet   Yes No   bacitracin 500 UNIT/GM ointment   No No   Sig: Apply small amount to affected area on face twice daily   ciprofloxacin (CIPRO) 500 MG tablet   No No   Sig: Take 1 tablet by mouth daily.   furosemide (LASIX) 20 MG tablet  Yes No   Sig: Take 2 tablets by mouth daily.   glipiZIDE (GLUCOTROL) 10 MG tablet   No No   Sig: Take 1 tablet by mouth 2 times daily.   insulin glargine (LANTUS) 100 UNIT/ML injection   No No   Sig: Inject 10 Units under the skin nightly.   multivitamin (MULTI-VITAMIN) tablet   Yes No   Sig: Take 1 tablet by mouth daily.   ranitidine (ZANTAC) 300 MG tablet   Yes No   Sig: Take 300 mg by mouth 2 times daily.   rifaximin (XIFAXAN) 550 MG tablet   Yes No   Sig: Take 550 mg by mouth every 12 hours.   spironolactone (ALDACTONE) 50 MG tablet   No No   Sig: Take 2 tablets by mouth daily.      Facility-Administered Medications: None       Allergies: Review of patient's allergies indicates no known allergies.    Review of Systems:   Gen: Denies fevers, chills  Head: Denies headaches  Eyes: Denies blurred vision  Nose: Denies rhinorrhea or congestion  CV: per hpi  Lung: per hpi  Abd: Denies abd pain, vomiting  GU: Denies hematuria, dysuria  Psych: Denies SI, HI, or AVH  [place]: Denies weakness or numbness     Review of Systems  All other systems reviewed and  negative unless otherwise noted in the HPI or above. This was done per my custom and practice for systems appropriate to the chief complaint in an emergency department setting and varies depending on the quality of history that the patient is able to provide.      Physical Exam:  4    06/19/14  1951 06/20/14  0118 06/20/14  0513   BP: 122/70 133/57 153/66   Pulse: 96 88 86   Temp: 98.5 F (36.9 C)     Resp: 16 17 16    SpO2: 96% 95% 97%     Nursing note and vitals reviewed.     Physical Exam  General: NAD, pleasant   HEENT: PERRL, MMM   Neck: No neck stiffness or tenderness   CV: rrr, no m/r/g   Pulm: R lung field with decreased breath sounds up to RUL. Some faint crackles in RUL.    Abd: NT/ND, normoactive bs   Extr: BLE without edema, 2+ DP and radial pulses bilaterally   Skin: Normal color, no rash   Neuro: AAOx3, normal gait   Psych: mood/affect appropriate, linear thinking.         X-ray Chest Frontal And Lateral    06/19/2014   EXAM DESCRIPTION: CHEST 2 VIEWS FRONTAL & LATERAL  CLINICAL HISTORY: Shortness of breath  COMPARISON: 05/23/2014  FINDINGS: The left lung is clear and well expanded. Large right-sided pleural effusion  with associated right base opacity.  The cardiomediastinal silhouette is stable.  The trachea and hilar regions are normal.  No acute osseous abnormalities.  IMPRESSION: Large right-sided pleural effusion with associated right base opacity.  CONCURRENT SUPERVISION: I have reviewed the images and agree with the resident interpretation.    Results for orders placed or performed during the hospital encounter of 06/20/14   CBC WITH ADIFF, BLOOD   Result Value Ref Range    WBC 3.8 (L) 4.0 - 10.0 1000/mm3    RBC 4.01 3.90 - 5.20 mill/mm3    Hgb 13.3 11.2 - 15.7 gm/dL    Hct 36.4 34.0 - 45.0 %    MCV 90.8 79.0 - 95.0 um3  MCH 33.2 (H) 26.0 - 32.0 pgm    MCHC 36.5 (H) 32.0 - 36.0 %    RDW 14.9 (H) 12.0 - 14.0 %    MPV 11.0 9.4 - 12.4 fL    Plt Count 50 (L) 140 - 370 1000/mm3    Segs 72 (H) 34 -  71 %    Lymphocytes 16 (L) 19 - 53 %    Monocytes 10 5 - 12 %    Eosinophils 1 <1-4 %    Basophils 1 0 - 2 %    ANC-Automated 2.7 1.6 - 7.0 1000/mm3    Abs Lymphs 0.6 (L) 0.8 - 3.1 1000/mm3    Abs Monos 0.4 0.2 - 0.8 1000/mm3    Diff Type Automated    TROPONIN T, BLOOD   Result Value Ref Range    Troponin T <0.01 <0.01 ng/mL   COMPREHENSIVE METABOLIC PANEL, BLOOD   Result Value Ref Range    Glucose 384 (H) 70 - 115 mg/dL    BUN 18 6 - 20 mg/dL    Creatinine 1.09 (H) 0.51 - 0.95 mg/dL    GFR 49 mL/min    Sodium 131 (L) 136 - 145 mmol/L    Potassium 4.0 3.5 - 5.1 mmol/L    Chloride 96 (L) 98 - 107 mmol/L    Bicarbonate 23 22 - 29 mmol/L    Anion Gap 12 7 - 15 mmol/L    Calcium 8.8 8.5 - 10.6 mg/dL    Total Protein 7.9 6.0 - 8.0 g/dL    Albumin 3.2 (L) 3.5 - 5.2 g/dL    Bilirubin, Tot 1.84 (H) <1.20 mg/dL    AST (SGOT) 35 (H) 0 - 32 U/L    ALT (SGPT) 37 (H) 0 - 33 U/L    Alkaline Phos 183 (H) 35 - 140 U/L   LIPASE, BLOOD   Result Value Ref Range    Lipase 60 13 - 60 U/L   PRO BNP, BLOOD   Result Value Ref Range    BNPP 489 0 - 899 pg/mL   PROTHROMBIN TIME, BLOOD   Result Value Ref Range    PT,Patient 16.1 (H) 9.7 - 12.5 sec    INR 1.5        Impression & Initial ED Plan:  73 year old  female presents with SOB and cp x 5 days without fever. On exam decreased bs on R lung fields. CXR with large effusion. Pt has no fevers and denies cough so not starting abx. Suspect has effusion 2/2 ascites from cirrhosis. Pt likely requires thoracentesis but not emergent enough to be done in ED and may resolve with aggressive diuresis.    Admit to medicine      I have discussed my evaluation and care plan for the patient with the attending physician Dr. Angelina Ok.      Marylou Mccoy, MD  Resident  06/20/14 5009    Virgia Land, MD  06/21/14 [phone]

## 2014-06-20 NOTE — ED Floor Report (Addendum)
ED to [facility] is a 73 year old female    Chief Complaint   Patient presents with    Shortness of Breath     Pt verbalizes sob x3 days, worsening today endorsing hx of fluid retention, verbalizes having on/off generalized cp radiating to back.  Pt speaking full sentences.Pt also c/o on/off generalized abd pain x2 days, denies discomfort at this time  Denies n/v/d.  EKG being done in triage.         Admitted for: Shortness of breath     Code Status:  Please refer to In-pt admitting doctors orders     Past Medical History   Diagnosis Date    Migraine headache     Glaucoma     Ascites 06/29/2007     History of paracentesis x 2    Pancreatic cyst 06/29/2007    Cirrhosis 06/24/2007     Active on the liver transplant waiting list with a blood type of O positive. 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    Type II or unspecified type diabetes mellitus with peripheral circulatory disorders, not stated as uncontrolled(250.70) 06/10/2007    HTN 06/10/2007    HTN 06/10/2007       Past Surgical History   Procedure Laterality Date    Pb laps surg cholecstc w/expl common duct  1970s     in [place];  open CCX    Pb cesarean delivery only      Pb cstoplasty/cstourtp plstc any       Bladder suspension    Egd procedure  06/01/2013     Procedure: GI EGD;  Surgeon: Jeanette Caprice, MD;  Location: [facility];  Service: Gastroenterology;;       Allergies: Review of patient's allergies indicates no known allergies.    Level of Care : Med Surg     ED Fall Risk: Yes    Skin issues:  no    >> If yes, note areas of skin breakdown. See appropriate photos.      Ambulatory:  Yes, but needs assistance     Sitter needed: no    Suicide Risk :  no    Isolation Required: no     >> If yes , what type of isolation:    Is  patient in custody?  no    Is patient in restraints? no    Is patient on Heparin? no If yes, complete below:   Bolus=    Units      Units/hr   @   Mls/hour      Time of next PT/PTT draw:          Brief Summary of ED Visit (to include focused assessment and neuro status):      Patient arrive at the ED after being SOB for the past week. Patient is AxOx3. At this time pt. Denies pain and reports a small amount of difficulty breathing. Lung sounds are clear bilaterally. Breathing is unlabored and bilateral lung expansion is even.     A foley catheter was placed. A 20 gauge IV was placed at the R. AC- IV was flushed at 1047.     Patient has a PMH of diabetes and was given 2 units  Insulin humalog and 15 units insulin lantis at 0851. Pt. Tolerated well.     At this time patient is resting and in no apparent distress.       See  RN SHIFT ASSESSMENT ADULT  for full assessment, exceptions will be listed.    Cardiac rhythm:  NSR at 90    Oxygen Delivery: None    Filed Vitals:    06/19/14 1951 06/20/14 0118 06/20/14 0513 06/20/14 0713   BP: 122/70 133/57 153/66 112/55   Pulse: 96 88 86 88   Temp: 98.5 F (36.9 C)      Resp: 16 17 16 16    Height: 5\' 1"  (1.549 m)      Weight: 56.246 kg (124 lb)      SpO2: 96% 95% 97% 95%       Lab Results   Component Value Date    WBC 3.8* 06/20/2014    RBC 4.01 06/20/2014    HGB 13.3 06/20/2014    HCT 36.4 06/20/2014    MCV 90.8 06/20/2014    MCHC 36.5* 06/20/2014    RDW 14.9* 06/20/2014    PLT 50* 06/20/2014    MPV 11.0 06/20/2014       Lab Results   Component Value Date    NA 131* 06/20/2014    K 4.0 06/20/2014    CL 96* 06/20/2014    BICARB 23 06/20/2014    BUN 18 06/20/2014    CREAT 1.09* 06/20/2014    GLU 384* 06/20/2014    [place] 8.8 06/20/2014       No results found for: BNP, PHOS, MG, LACTATE, AMMONIA, IONCA, ARTIONCA    Lab Results   Component Value Date    TROPONIN <0.01 06/20/2014       No results found for: PH, PCO2, O2CONTENT, IVHC3, IVBE, O2SAT, UNPH, UNPCO2, ARTPH, ARTPCO2, ARTO2CNT,  IAHC3, IABE, ARTO2SAT, UNAPH, UNAPCO2    No results found for this visit on 06/20/14.    RADIOLOGIC STUDIES NOT COMPLETED:   (none unless otherwise noted)    Active PICC Line / CVC Line / PIV Line / Drain / Airway / Intraosseous Line / Epidural Line / ART Line / Line Type / Wound     Name: Placement date: Placement time: Site: Days:    PICC Single Lumen - Power PICC Right Brachial 11/04/13  1145  Brachial  227    Peripheral IV - 20 G Right Antecubital 06/20/14  0230  Antecubital  less than 1    Indwelling Urinary Catheter -  06/20/14 0100 RN Standard (Latex) 10 ml 06/20/14  0633  Standard (Latex)  less than 1            Any significant events and interventions with responses:    Paracentesis: no significant vital change       Floor nurse informed that report is ready for review.  Opportunity to answer questions with floor RN face to face or by phone. ER number is [phone] . ER RN Michelene Gardener to be contacted for any questions.    Has this report been updated?  yes  By Who:   Time:   Additional information by updated user:

## 2014-06-20 NOTE — Consults (Signed)
Hepatology Initial Consult Note  Consult Attending: Dr. 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  Has been relatively well-compensated with no pleural effusion over the past year and volume status under control with lasix 4m/spirolactone 1062m  Over past week has developed progressive SOB at rest and with exertion similar to how she has felt when pleural effusion reaccumulates.  Last thoracentesis in 12/2013, but no additional interventions needed.  Admittedly been eating out up to 3x per week when previuosly compliant with salt-restrited diet.  Has noted lower extremity edema and abd swelling. No other signs of decompensated liver disease with jaundice, confusion, or melena/hematochezia.     In ER, initial CXR confirming reaccumulation of R pleural effusion.  Underwent dx para without findings of SBP and started on IV diuretics.     Past Medical History:  Past Medical History   Diagnosis Date    Migraine headache     Glaucoma     Ascites 06/29/2007     History of paracentesis x 2    Pancreatic cyst 06/29/2007    Cirrhosis 06/24/2007     Active on the liver transplant waiting list with a blood type of O positive. 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    Type II or unspecified type diabetes mellitus with  peripheral circulatory disorders, not stated as uncontrolled(250.70) 06/10/2007    HTN 06/10/2007    HTN 06/10/2007       Past Surgical History:   Past Surgical History   Procedure Laterality Date    Pb laps surg cholecstc w/expl common duct  1970s     [place]    Pb cesarean delivery only      Pb cstoplasty/cstourtp plstc any       Bladder suspension    Egd procedure  06/01/2013     Procedure: GI EGD;  Surgeon: KuJeanette CapriceMD;  Location: [facility];  Service: Gastroenterology;;       Allergies:  No Known Allergies    Home Medications:  Prescriptions prior to admission   Medication Sig Dispense Refill Last Dose    alendronate (FOSAMAX) 70 MG tablet    Unknown    ALPHAGAN P 0.1 % ophthalmic solution    Unknown    bacitracin 500 UNIT/GM ointment Apply small amount to affected area on face twice daily 1 Tube 1 Unknown    CALCIUM CARBONATE 600 MG OR TABS 1 tablet 2 times daily one month 11 06/19/2014    ciprofloxacin (CIPRO) 500 MG tablet Take 1 tablet by mouth daily. 30 tablet 5 06/19/2014    furosemide (LASIX) 20 MG tablet Take 2 tablets by mouth daily. 90 tablet 0 06/19/2014    glipiZIDE (GLUCOTROL)  10 MG tablet Take 1 tablet by mouth 2 times daily. 60 tablet 3 06/19/2014    insulin glargine (LANTUS) 100 UNIT/ML injection Inject 10 Units under the skin nightly. 1 Vial 2 06/19/2014    LUMIGAN 0.01 %    Unknown    multivitamin (MULTI-VITAMIN) tablet Take 1 tablet by mouth daily.   06/19/2014    ranitidine (ZANTAC) 300 MG tablet Take 300 mg by mouth 2 times daily.   06/19/2014    rifaximin (XIFAXAN) 550 MG tablet Take 550 mg by mouth every 12 hours.   06/19/2014    spironolactone (ALDACTONE) 50 MG tablet Take 2 tablets by mouth daily. 60 tablet 3 06/19/2014    VITAMIN D 400 UNIT OR CAPS 1 CAPSULE DAILY 30 11 06/19/2014       Inpatient Medications:  Scheduled Meds   calcium carbonate  600 mg BID    [START ON 06/21/2014] ciprofloxacin  500 mg Q24H    famotidine  20 mg Daily    [START ON  06/21/2014] furosemide  40 mg Daily    [START ON 06/21/2014] insulin glargine  15 Units QAM    insulin lispro  1-10 Units 4x Daily WC    multivitamin  1 tablet Daily    rifaximin  550 mg Q12H    sodium chloride (PF)  3 mL Q8H    [START ON 06/21/2014] spironolactone  100 mg Daily     IV Meds   sodium chloride         Family History:  No family history on file.    Social History:   History     Social History    Marital Status: Married     Spouse Name: N/A     Number of Children: N/A    Years of Education: N/A     Occupational History    retired      Social History Main Topics    Smoking status: Former Smoker -- 10 years     Quit date: 11/13/2012    Smokeless tobacco: Never Used    Alcohol Use: No    Drug Use: No    Sexual Activity: Not on file     Other Topics Concern    Not on file     Social History Narrative    No narrative on file       Review of Systems: -   Constitutional: Denies fevers, chills, weight loss  ENT: negative for eye changes, oral lesions, difficulty swallowing  Cardiac: Denies chest pain or palpitations  Lungs: Denies cough or shortness of breath  GI: Negative except noted in the HPI  GU: Denies dysuria, hematuria  Skin: Denies rashes or other skin lesions  MSK: Denies joint deformities, new joint pain  Lymph: Denies enlarged nodes, night sweats, abnormal bleeding  Psych: negative for depressed mood, anxiety  Neuro: negative for confusion, weakness, sensory changes    OBJECTIVE:     Vitals signs:     Latest Entry  Range (last 24 hours)    Temperature: 98.1 F (36.7 C)  Temp  Avg: 98.3 F (36.8 C)  Min: 98.1 F (36.7 C)  Max: 98.5 F (36.9 C)    Blood pressure (BP): 127/65 mmHg  BP  Min: 112/55  Max: 153/66    Heart Rate: 89  Pulse  Avg: 89.5  Min: 86  Max: 96    Respirations: 16  Resp  Avg: 16.3  Min: 16  Max: 17    SpO2: 96 %  SpO2  Avg: 95.7 %  Min: 95 %  Max: 97 %       No Data Recorded     Weight - scale: 55 kg (121 lb 4.1 oz)  Percentage Weight Change (%): -2.22  %    Intake/Output       06/19/14 0600 - 06/20/14 0559 (Not Admitted) 06/20/14 0600 - 06/21/14 0559      0737-1062 1800-0559 Total 0600-1759 1800-0559 Total       Intake    P.O.  --  -- --  120  -- 120    Total Intake -- -- -- 120 -- 120       Output    Urine  --  -- --  1000  -- 1000    Total Output -- -- -- 1000 -- 1000       Net I/O     -- -- -- -880 -- -880        Respiratory support:  Oxygen Therapy  SpO2: 96 %  O2 Device: None (Room air)       Physical Exam:  General:  NAD  HEENT:  No scleral icterus, Trachea midline, mucus membranes moist.  Lungs:  Decreased BS on R side  Cardiovascular:  Irregular  Abdomen:  Distended with mild fluid wave  Extremities:  Edema present bilaterally  Skin:  Non-icteric and no visible rash  Neuro:  No asterixis    Labs:     CBC  Recent Labs      06/20/14   0231   WBC  3.8*   HGB  13.3   HCT  36.4   PLT  50*   SEG  72*   LYMPHS  16*   MONOS  10        Chemistry  Recent Labs      06/20/14   0231   NA  131*   K  4.0   CL  96*   BICARB  23   BUN  18   CREAT  1.09*   GLU  384*   [place]  8.8     Recent Labs      06/20/14   0231   ALK  183*   AST  35*   ALT  37*   TBILI  1.84*   ALB  3.2*          Coags  Recent Labs      06/20/14   0231   PT  16.1*   INR  1.5       Radiology:   CXR 06/19/2014  FINDINGS:  The left lung is clear and well expanded. Large right-sided pleural effusion   with associated right base opacity.    The cardiomediastinal silhouette is stable.    The trachea and hilar regions are normal.    No acute osseous abnormalities.    IMPRESSION:  Large right-sided pleural effusion with associated right base opacity.    Prior Endoscopy:   EGD 05/2013  Endoscopic Diagnosis: 1. Midline orophyarngeal  prominence at the arytenoid  2. Three columnns of grade 1 esophageal varices, no high  risk features  3. No gastric varices, mild portal hypertensive gastropathy  4. Few punctate clots in the antrum  5. 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  Continue propranolol 10 BID  2. ENT consultation to evaluate oropharyngeal findings  3. Repeat EGD in 1 year for varices screening    ASSESSMENT / PLAN: 73 yo hispanic female HCV  GT 2 cirrhosis (achieved SVR) fairly well compensated. 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     #R pleural effusion: Last thoracentesis 12/2012 and suspected to be related to dietary noncompliance.  No signs of infection or new onset CHF at this time.  No indication for TIPS etc at this time    --would attempt diuresis with IV lasix 63m BID, cont spironolactone.  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  Relative stable disease    -low salt diet, diuresis with IV lasix + spironolactone, strict I/Os + daily wts  -obtain abd u/s with doppler    *EV: 05/2013 grade I EV.  Not currently taking BB per patient report.   *Ascites: No SBP on dx para 11/10. Cont cipro ppx. On IV lasix + spironolactone.  Educate again on low salt diet  *HE: On rifaximin, unclear if previously intolerant of lactulose  *H[place]: [place]  Serial imaging for IPMN.  Order repeat abd u/s with doppler    DWA KSilverio Decamp MD  [facility] Fellow

## 2014-06-21 LAB — HEMOGRAM, BLOOD
Hct: 34 % (ref 34.0–45.0)
Hgb: 12.2 gm/dL (ref 11.2–15.7)
MCH: 32.9 pg — ABNORMAL HIGH (ref 26.0–32.0)
MCHC: 35.9 % (ref 32.0–36.0)
MCV: 91.6 um3 (ref 79.0–95.0)
MPV: 10.3 fL (ref 9.4–12.4)
Plt Count: 42 10*3/uL — ABNORMAL LOW (ref 140–370)
RBC: 3.71 10*6/uL — ABNORMAL LOW (ref 3.90–5.20)
RDW: 14.8 % — ABNORMAL HIGH (ref 12.0–14.0)
WBC: 4.2 10*3/uL (ref 4.0–10.0)

## 2014-06-21 LAB — GLUCOSE (POCT)
Glucose (POCT): 269 mg/dL — ABNORMAL HIGH (ref 70–115)
Glucose (POCT): 399 mg/dL — ABNORMAL HIGH (ref 70–115)
Glucose (POCT): 253 mg/dL — ABNORMAL HIGH (ref 70–115)
Glucose (POCT): 181 mg/dL — ABNORMAL HIGH (ref 70–115)

## 2014-06-21 LAB — BASIC METABOLIC PANEL, BLOOD
Anion Gap: 11 mmol/L (ref 7–15)
BUN: 18 mg/dL (ref 6–20)
Bicarbonate: 24 mmol/L (ref 22–29)
Calcium: 8.6 mg/dL (ref 8.5–10.6)
Chloride: 94 mmol/L — ABNORMAL LOW (ref 98–107)
Creatinine: 1.01 mg/dL — ABNORMAL HIGH (ref 0.51–0.95)
GFR: 54 mL/min
Glucose: 448 mg/dL — ABNORMAL HIGH (ref 70–115)
Potassium: 4 mmol/L (ref 3.5–5.1)
Sodium: 129 mmol/L — ABNORMAL LOW (ref 136–145)

## 2014-06-21 LAB — MRSA SURVEILLANCE CULTURE

## 2014-06-21 MED ORDER — INSULIN LISPRO (HUMAN) 100 UNIT/ML SC SOLN (CUSTOM)
8.0000 [IU] | Freq: Three times a day (TID) | INTRAMUSCULAR | Status: DC
Start: 2014-06-21 — End: 2014-06-23
  Administered 2014-06-21 – 2014-06-22 (×2): 8 [IU] via SUBCUTANEOUS
  Administered 2014-06-22 (×2): 4 [IU] via SUBCUTANEOUS
  Filled 2014-06-21: qty 8
  Filled 2014-06-21: qty 4
  Filled 2014-06-21: qty 8
  Filled 2014-06-21: qty 4

## 2014-06-21 MED ORDER — INSULIN GLARGINE 100 UNIT/ML SC SOLN
25.0000 [IU] | Freq: Every morning | SUBCUTANEOUS | Status: DC
Start: 2014-06-22 — End: 2014-06-23
  Administered 2014-06-22: 25 [IU] via SUBCUTANEOUS
  Filled 2014-06-21: qty 25

## 2014-06-21 MED ORDER — INSULIN GLARGINE 100 UNIT/ML SC SOLN
20.0000 [IU] | Freq: Every morning | SUBCUTANEOUS | Status: DC
Start: 2014-06-22 — End: 2014-06-21

## 2014-06-21 MED ORDER — INSULIN GLARGINE 100 UNIT/ML SC SOLN
5.0000 [IU] | Freq: Once | SUBCUTANEOUS | Status: AC
Start: 2014-06-21 — End: 2014-06-21
  Administered 2014-06-21: 5 [IU] via SUBCUTANEOUS
  Filled 2014-06-21: qty 5

## 2014-06-21 MED ORDER — INSULIN LISPRO (HUMAN) 100 UNIT/ML SC SOLN (CUSTOM)
4.0000 [IU] | Freq: Three times a day (TID) | INTRAMUSCULAR | Status: DC
Start: 2014-06-21 — End: 2014-06-21
  Administered 2014-06-21: 13:00:00 4 [IU] via SUBCUTANEOUS
  Filled 2014-06-21: qty 4

## 2014-06-21 MED ORDER — INSULIN LISPRO (HUMAN) 100 UNIT/ML SC SOLN (CUSTOM)
1.0000 [IU] | Freq: Four times a day (QID) | INTRAMUSCULAR | Status: DC
Start: 2014-06-21 — End: 2014-06-21

## 2014-06-21 MED ORDER — THERA M PLUS OR TABS
1.0000 | ORAL_TABLET | Freq: Every day | ORAL | Status: DC
Start: 2014-06-21 — End: 2014-07-04
  Administered 2014-06-21 – 2014-07-04 (×14): 1 via ORAL
  Filled 2014-06-21 (×14): qty 1

## 2014-06-21 NOTE — Interdisciplinary (Signed)
Consult acknowledged: low salt diet education and eval  Clinical Nutr Initial Assessment: PT assessed at Moderate nutr risk.   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    Type II or unspecified type diabetes mellitus with peripheral circulatory disorders, not stated as uncontrolled(250.70) 06/10/2007    HTN 06/10/2007    HTN 06/10/2007     HT: 61" WT: 55kg IBW: 47.7kg %IBW: 115 BMI: 22.9     Diet Rx: 2g Na  PO Intake: Pt says her appetite is good overall, not usually hungry but aware she needs to eat. Says she knows she needs to monitor salt but can't stand very long for meal prep so they go out to eat 2-3x/week. She always makes breakfast and avoids salt in that meal, husband helps with some meals and shopping. Says she is hoping to get someone to come 3x/week to help them around the house and with cooking. She knows salt is not good for her and willing to try and ask restaurant to prepare low salt dishes for her, provided low Na handout in [place].   GI: Abdomen: Distended with mild fluid wave per MD note  Skin: WDL per RN Head to Toe assessment   I/O: 603/1400   Labs: (11/10) Na 131, Cr 1.09, GFR 49, Gluc 384, Alkphos 183, ALT 37, AST 35, BiliT 1.84, Hgb A1c 8.1, POC BG 237-341  Nutr Related MEDS: Lasix, Cipro, calcium  carbonate, SSI, Lantus, MVT, Aldactone    EST Needs: [place] (55kg) 1003 x 1.2-1.5  1204-1504kcals (22-27kcals/kg) 72-88g Pro/D (1.3-1.6g Pro/kg) Fluids per MD    D: Increased nutrient needs r/t medical condition AEB cirrhosis  I: (goal 11/11) Pt meet >75% of estimated needs with acceptable tolerance    PLAN:   1. REC Add Carb limited to Diet order  2. REC Check Vit D3, Vit A, and Quantitative Zinc  3. REC Add minerals to MVT  4. REC Daily weights  Discharge: Low Na/Carb Limited  Education: Reviewed Low Na diet guidelines, monitoring carbohydrates  RD to monitor/evaluate: labs, wt trend, and po, and S/S of new skin concerns.   Will f/u per policy; nutr plan of care (coordination of care) D/W bedside RN, resident MD- paged w/recs # [phone]  Rhea Pink, [place], RD pager [phone]

## 2014-06-21 NOTE — Interdisciplinary (Signed)
Handoff report given to Monica RN

## 2014-06-21 NOTE — Plan of Care (Signed)
Problem: Pain - Acute  Goal: Control of acute pain  Outcome: Met  Pt denied pain this shift. Encouraged rest and provided pillow support. Will cont to reassess.     Problem: Breathing Pattern - Ineffective  Goal: Respiratory rate, rhythm and depth return to baseline  Outcome: Not Met  RR 16, even and regular. O2 NC at 2L for comfort. HOB elevated. Encouraged pt to reposition and take deep breaths. Will cont to monitor.     Problem: Mobility - Impaired  Goal: Able to ambulate within specified parameters  Outcome: Met  Pt able to ambulate to the bathroom with personal cane and standby assist. Denies pain while ambulating. Encouraged pt to practice ROM exercises. Will cont to reassess.     Problem: Falls, Risk of  Goal: Keep patient free from falls utilizing universal fall precautions  Outcome: Met  Pt remains free from falls. Pt oriented to unit and room. Bed is in lowest position and brakes on. Room is free from clutter. Personal belongings and call light within reach. Nonskid footwear applied. Pt refused bed alarm. Family at bedside throughout the day to monitor. Educated pt of fall risk safety and precautions and pt verbalized understanding. Encouraged pt to use call light for assistance. Will continue to monitor and perform hourly rounds.    Problem: Discharge Planning  Goal: Participation in care planning  Outcome: Not Met  No plans for DC today. Ongoing POC includes fluid restriction 1500 ml/day, strict I&O, pain mgmt, pending US abdomen and safety. Will cont to update pt and pt's family at bedside.     Problem: Fluid Volume Excess  Goal: Absence of edema; peripheral and/or pulmonary  Outcome: Met  Ongoing fluid restriction 1500 ml/day. Pt and family at bedside acknowledged. No edema noted to BLE. Pt denied SOB or chest pain. Will cont to monitor.

## 2014-06-21 NOTE — Interdisciplinary (Addendum)
Pt arrived to unit via bed. On oxygen 2 liters per nc. Pt alert and oriented x 4, spanish speaking. Family at bedside. Oriented pt to unit routine, call light and fall precautions and understanding verbalized. Pt sat up in chair for dinner. At 1900, pt ambulated to commode with assist, and while getting to bed, she stated she was sob. Mild dyspnea on exertion noted. Lungs diminished bilat and oxygen in situ. Skin is warm, pink and dry. Vitals checked and are per baseline with exception of pulse, which was irregularly irregular, 96 per minute (apical pulse checked x 1 minute). Pt denies dizziness or chest pain and sob resolved once positioned in bed with hob up 30 degrees. Pt denies hx of irregular pulse. Also noted with purple discoloration on right ankle. States she has had this for 20 years. Pulses good to bilat LE and skin to bilat LE is warm and pink. Cap refill less than 3 seconds. md paged and endorsed to noc rn for follow up.

## 2014-06-21 NOTE — Plan of Care (Signed)
Patient report given to Ernie RN from 11W. Endorsed f/u of US Abdomen and AM lab results.

## 2014-06-21 NOTE — Plan of Care (Signed)
Lamount Cohen BAZICK/ [phone]    Re: [place]. Morning labs: Na 129, Glucose 448, Plt Count 42. Latest fingerstick BS was 399. Thanks!

## 2014-06-21 NOTE — Progress Notes (Signed)
Daily Progress Note:  06/21/2014     Current Hospital Stay:   1 day - Admitted on: 06/20/2014    Subjective:  No acute events overnight.  Still c/o dyspnea and required oxygen this am.    Objective:    Vital Signs:  Temperature:  [97.6 F (36.4 C)-98.2 F (36.8 C)] 97.8 F (36.6 C) (11/11 0740)  Blood pressure (BP): (116-137)/(49-67) 121/49 mmHg (11/11 0740)  Heart Rate:  [84-93] 93 (11/11 0740)  Respirations:  [16-19] 16 (11/11 0740)  Pain Score:  [-] 0 (11/11 0740)  O2 Device:  [-] Nasal cannula (11/11 0747)  O2 Flow Rate (L/min):  [2 l/min] 2 l/min (11/11 0747)  SpO2:  [92 %-98 %] 94 % (11/11 0747)    Wt Readings from Last 1 Encounters:   06/20/14 55 kg (121 lb 4.1 oz)       Intake/Output (Current Shift):  11/11 0600 - 11/11 1759  In: 240 [P.O.:240]  Out: -       Physical Exam:  GEN: elderly, thin female in NAD. Cooperative, responding appropriately.   HEENT: NCAT, neck supple, anicteric sclera, EOMI, OP clear   CV: rrr, no m/r/g   Pulmonary: decreased BS over entire R lung fields   Gastrointestinal: +NABS, soft but distended, + fluid wave, TTP RLQ, no rebound or guarding, abdominal scar RUQ s/p cholecystectomy, + reducible RUQ hernia  Neuro: no asterixis     Laboratory data:   Lab Results   Component Value Date    NA 129* 06/21/2014    K 4.0 06/21/2014    CL 94* 06/21/2014    BICARB 24 06/21/2014    BUN 18 06/21/2014    CREAT 1.01* 06/21/2014    GLU 448* 06/21/2014    [place] 8.6 06/21/2014     Lab Results   Component Value Date    WBC 4.2 06/21/2014    HGB 12.2 06/21/2014    HCT 34.0 06/21/2014    PLT 42* 06/21/2014    SEG 72* 06/20/2014    LYMPHS 16* 06/20/2014    MONOS 10 06/20/2014    EOS 1 06/20/2014     Lab Results   Component Value Date    AST 35* 06/20/2014    ALT 37* 06/20/2014    ALK 183* 06/20/2014    TBILI 1.84* 06/20/2014    TP 7.9 06/20/2014    ALB 3.2* 06/20/2014       Assessment and Plan:  Paula Manning is a 73 year old spanish speaking only female with PMHx of HepC cirrhosis s/p treatment  with achievement of SVR, DM, HTN who presents to the ED for shortness of breath x 5 days.     #. Sob 2/2 right pleural effusion: likely due to dietary indiscretion   Cont Diuresis with 20 IV lasix bid, aldactone 100   May need thoracentesis if doesn't respond to diuresis (has had this in the past)   Strict I/O  Low Na diet   Nutrition consult for education on low salt diet     #. Abdominal pain:   Paracentesis negative for SBP on admission   Pain resolved today  Diuresis for ascites as above    #. CP: atypical, EKG without acute st/t wave changes. Resolved.    #. Hep C Cirrhosis: s/p treatment of Hep C with SVR achieved   Ascites-diuretics per above   HE-mental status clear, cont rifaxamin  EV-sm distal EV 5/13: propanolol dc'd at last admission 2/2 bradycardia   Continue cipro for  SBP ppx   HCC: imaging done in May     #. DM: on lantus and glipizide as outpatient.   Cont lantus, SSI     FEN: low sodium diet   PPx: SCDs, Hold chemoppx given thrombocytopenia  FC/FC

## 2014-06-21 NOTE — Progress Notes (Signed)
Endocrine/Diabetes Brief Consult Note:      *Pt was down getting a procedure when I went to see her, so the following recs are based on history alone and review of pt's chart, labs and current regimen.  Formal consult to follow in the morning tomorrow.*    CC: uncontrolled DM    HPI: 66yof with hx of Hep C cirrhosis, HTN, and DM2 diagnosed around 2005, here for SOB and fluid overload, likely 2/2 increased salt intake in setting of her ESLD.     Home DM regimen: glipizide 10mg  BID, Lantus 10 units qday    Lab Results   Component Value Date    A1C 8.1 06/20/2014    A1C 7.4 11/02/2013    A1C 6.0 11/30/2012     BGs reviewed:     [place] Right qhs    11/10 237 341 287 373  lantus 15  Lispro 2 4 3 4     11/11 269 399  lantus 15 5  Lispro 3 4+5    Impression and Recommendations:   31yof with hx of Hep C cirrhosis, HTN, and DM2 diagnosed around 2005, here for SOB and fluid overload, seen to have uncontrolled hyperglycemia since admission.    -Inpt Recs: (paged to first call)  -increase lantus to 25units qam, first dose tomorrow  -increase lispro to 8 units qac, with 1/2 dose for 1/2 meal consumed, and no dose if pt does not eat  -increase lispro correctional scale to moderate intensity  -full consult to follow tomorrow    Plan discussed with attending Dr. Karma Lew

## 2014-06-22 DIAGNOSIS — K7469 Other cirrhosis of liver: Secondary | ICD-10-CM

## 2014-06-22 DIAGNOSIS — E1165 Type 2 diabetes mellitus with hyperglycemia: Secondary | ICD-10-CM

## 2014-06-22 DIAGNOSIS — E1142 Type 2 diabetes mellitus with diabetic polyneuropathy: Secondary | ICD-10-CM

## 2014-06-22 DIAGNOSIS — I10 Essential (primary) hypertension: Secondary | ICD-10-CM

## 2014-06-22 DIAGNOSIS — B1920 Unspecified viral hepatitis C without hepatic coma: Secondary | ICD-10-CM

## 2014-06-22 DIAGNOSIS — IMO0002 Reserved for concepts with insufficient information to code with codable children: Secondary | ICD-10-CM

## 2014-06-22 DIAGNOSIS — E1140 Type 2 diabetes mellitus with diabetic neuropathy, unspecified: Secondary | ICD-10-CM

## 2014-06-22 DIAGNOSIS — J948 Other specified pleural conditions: Principal | ICD-10-CM

## 2014-06-22 LAB — COMPREHENSIVE METABOLIC PANEL, BLOOD
ALT (SGPT): 30 U/L (ref 0–33)
AST (SGOT): 28 U/L (ref 0–32)
Albumin: 2.8 g/dL — ABNORMAL LOW (ref 3.5–5.2)
Alkaline Phos: 148 U/L — ABNORMAL HIGH (ref 35–140)
Anion Gap: 12 mmol/L (ref 7–15)
BUN: 20 mg/dL (ref 6–20)
Bicarbonate: 24 mmol/L (ref 22–29)
Bilirubin, Tot: 2.33 mg/dL — ABNORMAL HIGH (ref <1.20)
Calcium: 8.5 mg/dL (ref 8.5–10.6)
Chloride: 100 mmol/L (ref 98–107)
Creatinine: 0.96 mg/dL — ABNORMAL HIGH (ref 0.51–0.95)
GFR: 57 mL/min
Glucose: 189 mg/dL — ABNORMAL HIGH (ref 70–115)
Potassium: 4 mmol/L (ref 3.5–5.1)
Sodium: 136 mmol/L (ref 136–145)
Total Protein: 6.7 g/dL (ref 6.0–8.0)

## 2014-06-22 LAB — CBC WITH DIFF, BLOOD
ANC-Automated: 3.6 10*3/uL (ref 1.6–7.0)
Abs Eosinophils: 0.1 10*3/uL (ref 0.1–0.7)
Abs Lymphs: 0.6 10*3/uL — ABNORMAL LOW (ref 0.8–3.1)
Abs Monos: 0.5 10*3/uL (ref 0.2–0.8)
Eosinophils: 2 % (ref 1–4)
Hct: 35.1 % (ref 34.0–45.0)
Hgb: 12.3 gm/dL (ref 11.2–15.7)
Lymphocytes: 12 % — ABNORMAL LOW (ref 19–53)
MCH: 32.1 pg — ABNORMAL HIGH (ref 26.0–32.0)
MCHC: 35 % (ref 32.0–36.0)
MCV: 91.6 um3 (ref 79.0–95.0)
MPV: 10.9 fL (ref 9.4–12.4)
Monocytes: 10 % (ref 5–12)
Plt Count: 49 10*3/uL — ABNORMAL LOW (ref 140–370)
RBC: 3.83 10*6/uL — ABNORMAL LOW (ref 3.90–5.20)
RDW: 15.1 % — ABNORMAL HIGH (ref 12.0–14.0)
Segs: 75 % — ABNORMAL HIGH (ref 34–71)
WBC: 4.8 10*3/uL (ref 4.0–10.0)

## 2014-06-22 LAB — GLUCOSE (POCT)
Glucose (POCT): 188 mg/dL — ABNORMAL HIGH (ref 70–115)
Glucose (POCT): 278 mg/dL — ABNORMAL HIGH (ref 70–115)
Glucose (POCT): 199 mg/dL — ABNORMAL HIGH (ref 70–115)
Glucose (POCT): 265 mg/dL — ABNORMAL HIGH (ref 70–115)

## 2014-06-22 LAB — EMMI , DIABETES, TYPE 2: EMMI Video Order Number: 14223706475

## 2014-06-22 LAB — PROTHROMBIN TIME, BLOOD
INR: 1.7
PT,Patient: 18.1 s — ABNORMAL HIGH (ref 9.7–12.5)

## 2014-06-22 MED ORDER — FUROSEMIDE 10 MG/ML IJ SOLN
40.0000 mg | Freq: Two times a day (BID) | INTRAMUSCULAR | Status: DC
Start: 2014-06-22 — End: 2014-06-24
  Administered 2014-06-22 – 2014-06-23 (×3): 40 mg via INTRAVENOUS
  Filled 2014-06-22 (×3): qty 4

## 2014-06-22 NOTE — Plan of Care (Addendum)
Problem: Skin Integrity- Intact patient high risk for impaired skin integrity Braden scale less than or equal to 18  Goal: Skin remains intact  Pt with very fragile skin. R PIV with old blood, this RN tried pulling off dressing slowly and with alcohol and pt in too much pain, small amount of the top layer of skin came off (approximately dime size amount at R [facility]). This RN stopped and did not remove any more of the dressing. Pt with discoloration to R leg/ankle area, per pt and family pt has had this for 20 years, will continue to monitor.

## 2014-06-22 NOTE — Plan of Care (Signed)
Problem: Pain - Acute  Goal: Control of acute pain  Pt reporting no pain at this time, will continue to monitor.     Problem: Breathing Pattern - Ineffective  Goal: Respiratory rate, rhythm and depth return to baseline  Outcome: Met  Pt breathing comfortably on 2 L NC, pt appears to be in no distress. Sp02: 100, Dr. Emily Filbert put in IR consult order for thoracentesis for pt comfort. Will continue to monitor.     Problem: Mobility - Impaired  Goal: Able to ambulate within specified parameters  Pt able to ambulate to commode with assistance, husband at bedside assisting pt and encouraging mobility, will continue to monitor.     Problem: Falls, Risk of  Goal: Keep patient free from falls utilizing universal fall precautions  Outcome: Met  Pt continues to be high fall risk. 2/4 bed rails up, bed brake on, bed is in lowest position, patient educated to call for assistance as needed, pt nodded head in agreement.  Bed alarm on, husband at bedside. Pt room free of clutter, will continue to monitor.          Problem: Discharge Planning  Goal: Participation in care planning  No plans for d/c at this time. RN is updating pt and family on POC as MDs place orders and make rounds. Dr. Emily Filbert spoke with pt daughter today. Will continue to monitor.     Problem: Fluid Volume Excess  Goal: Absence of edema; peripheral and/or pulmonary  Pt with small BLE +1 and MD planning on doing thoracentesis for hepatic hydrothorax. Will continue to monitor.

## 2014-06-22 NOTE — Interdisciplinary (Signed)
Attempted to see patient for initial DCP assessment, but DM RN at bedside.  CM will revisit tomorrow.

## 2014-06-22 NOTE — Plan of Care (Signed)
Problem: Pain - Acute  Goal: Control of acute pain  Outcome: Met  Pt denies pain at this time. Will continue to assess.    Problem: Breathing Pattern - Ineffective  Goal: Respiratory rate, rhythm and depth return to baseline  Pt is on 2L O2, sating 95-97%. Pt c/o difficulty breathing. HOB at 30 degrees, which pt states is comfortable position. Noted slight increase in SOB when ambulating to bathroom, bedside commode now available. Lungs clear and diminished at bases bilat on auscultation. Pt states she has non-productive cough. Will continue to monitor.     Problem: Mobility - Impaired  Goal: Able to ambulate within specified parameters  Pt ambulates to bedside commode with assist. Pt c/o SOB on exertion. Will continue to monitor.     Problem: Falls, Risk of  Goal: Keep patient free from falls utilizing universal fall precautions  Outcome: Met  Performed frequent rounds, side rails up x 2, call light and bedside table within reach, non-skid yellow socks on, bed locked and in lowest position, room free from clutter. Instructed pt to use call light for assistance. Family at bedside. No falls reported. Will continue to monitor.          Problem: Discharge Planning  Goal: Participation in care planning  Pt alert and oriented, spanish speaking, family at bedside assists with translation. Pt is cooperative with plan of care and family appears to be supportive. Lasix ordered BID, pt voiding. Possible thoracentesis if pt does not respond to diuresis.    Problem: Fluid Volume Excess  Goal: Absence of edema; peripheral and/or pulmonary  Bilateral lower edema and ascites noted. Pt and family understands that pt is on 1.5 L fluid restriction and diuretics. Strict I&O's in place. Will continue to monitor.

## 2014-06-22 NOTE — Consults (Signed)
Endocrine Consult Note    Consulting Physician: Lebron Conners., MD    Reason for Consult: provide opinion regarding treatment options for diabetes      HPI:  Paula Manning is a 73 year old female with uncontrolled DM2 c/b neuropathy as well as HepC cirrhosis, HTN, glaucoma and migraines admitted 06/20/2014 w/ 5 days SOB and found to have R pleural effusion thought 2/2 dietary indiscretion.  Asked by Dr. Emily Filbert to provide opinion regarding treatment options for diabetes management.    Pt reports h/o DM2 x 20 years c/b neuropathy, denies other complications including nephropathy, retinopathy, MI or CVA.  Home regimen is Lantus 20 units qhs with the pen, glipizide 10 mg bid.  She has a meter and checks her BG 2x/daily w/ am readings typically 120-150's and evening 200-400's w/ no hypoglycemia.    She drinks water when thirsty, has no low bgs, and loves pancakes, although otherwise eats 2-4 cho's/meal. Example: breakfast is nopales, eggs, 2 tortillas. Other meals may be meat/beans/rice/ and 1-2 tortillas.     Followed by Dr. [place] office in [place]. Lives with her husband and relatives close by.    ROS:  no f/c, weight gain/loss, chest pain/palpitations, SOB improved, no cough, abd pain, n/v, diarrhea/const, BRBPR/melena, dysuria/hematuria, orthopnea/PND/LEE. No polyruia, polydipsia or blurry vision.  No numbness, tingling, pain or new lesions B feet.      PMHx:  Past Medical History   Diagnosis Date    Migraine headache     Glaucoma     Ascites 06/29/2007     History of paracentesis x 2    Pancreatic cyst 06/29/2007    Cirrhosis 06/24/2007     Active on the liver transplant waiting list with a blood type of O positive. 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    Type II or unspecified type diabetes mellitus  with peripheral circulatory disorders, not stated as uncontrolled(250.70) 06/10/2007    HTN 06/10/2007    HTN 06/10/2007       PSHx:  Past Surgical History   Procedure Laterality Date    Pb laps surg cholecstc w/expl common duct  1970s     in [place];  open CCX    Pb cesarean delivery only      Pb cstoplasty/cstourtp plstc any       Bladder suspension    Egd procedure  06/01/2013     Procedure: GI EGD;  Surgeon: Jeanette Caprice, MD;  Location: [facility];  Service: Gastroenterology;;       SocHx:  Denies t/e/d    FamHx:  Denies DM    ALL: Review of patient's allergies indicates no known allergies.    MEDS:  Current Facility-Administered Medications   Medication    calcium carbonate suspension 600 mg    ciprofloxacin (CIPRO) tablet 500 mg    dextrose 50 % solution 12.5 g    famotidine (PEPCID) tablet 20 mg    furosemide (LASIX) injection 40 mg    glucagon (GLUCAGON) injection 1 mg    glucose chewable tablet 16 g    glucose oral gel 1 Tube    insulin glargine (LANTUS) injection 25 Units    insulin lispro (HUMALOG) injection 1-10 Units    insulin lispro (HUMALOG) injection 8 Units  multivitamin with minerals (THERA M PLUS) tablet 1 tablet    nalOXone (NARCAN) injection 0.1 mg    ondansetron (ZOFRAN) injection 4 mg    rifaximin (XIFAXAN) tablet 550 mg    sodium chloride (PF) 0.9 % flush 3 mL    sodium chloride (PF) 0.9 % flush 3 mL    sodium chloride 0.9% infusion    spironolactone (ALDACTONE) tablet 100 mg       PHYSICAL EXAM  Temperature:  [97 F (36.1 C)-98.4 F (36.9 C)] 97.1 F (36.2 C) (11/12 2128)  Blood pressure (BP): (90-140)/(45-60) 132/60 mmHg (11/12 2128)  Heart Rate:  [88-105] 91 (11/12 2128)  Respirations:  [16-20] 20 (11/12 2128)  Pain Score:  [-] 0 (11/12 2128)  O2 Device:  [-] Nasal cannula (11/12 2128)  O2 Flow Rate (L/min):  [2 l/min] 2 l/min (11/12 2128)  SpO2:  [95 %-100 %] 95 % (11/12 2128)  Body mass index is 22.88 kg/(m^2).    GEN: NAD  HENT: NC/AT, OP  clear  Eyes: EOMI, no periorbial edema, chemosis or lid-lag  NECK: supple  CV: RRR  RESP: breathing comfortably  ABD: soft  EXT: no edema, RLE w/ ecchymosis  NEURO: no tremor    LABS:  Lab Results   Component Value Date    A1C 8.1 06/20/2014    A1C 7.4 11/02/2013    A1C 6.0 11/30/2012     Lab Results   Component Value Date    NA 136 06/22/2014    K 4.0 06/22/2014    CL 100 06/22/2014    BICARB 24 06/22/2014    BUN 20 06/22/2014    CREAT 0.96 06/22/2014    GLU 189 06/22/2014    [place] 8.5 06/22/2014     Lab Results   Component Value Date    CHOL 156 12/28/2010    HDL 59 12/28/2010    LDLCALC 78 12/28/2010    TRIG 95 12/28/2010     Lab Results   Component Value Date    AST 28 06/22/2014    ALT 30 06/22/2014    GGT 12 10/16/2004    LDH 351 12/27/2010    ALK 148 06/22/2014    TP 6.7 06/22/2014    ALB 2.8 06/22/2014    TBILI 2.33 06/22/2014    DBILI 0.4 08/31/2012       Diet:CLS    Blood Sugars reviewed:      AM Lu Di HS O/N      11/11  269 399 253 181  lantus  15 5  Lispro  +3 5+4 8+3 -    11/12  188 278  Lantus  25  Lispro  4+1 8+3      A/P:  Paula Manning is a 73 year old female w/ uncontrolled DM2 c/b neuropathy as well as HepC cirrhosis, HTN admitted 06/20/2014 w/ 5 days SOB and found to have R pleural effusion thought 2/2 dietary indiscretion.     BG consistently elevated, insulin doses just increased this am, will cont to monitor BG over the next 24 hrs and adjust accordingly.    Inpatient Recs:  - Cont lantus 25 units, but move to q noon dosing  - Cont lispro 8 units ac tid, RN to give 0, 1/2, whole dose based on % tray consumed  - Increase Low--->Mod correction scale w lispro ac/hs    Outpatient Recs/Education:    1. Self-monitor blood glucose: has meter covered per insurance and strips    2. Healthy Eating:  reviewed cho groups with family and pt, nurse to show Emmi videos    3. Activity:per MD    4. Hypoglycemia:will cover tomorrow, has none currently    5. Medication: likely modify dosages, but same, oral  and insulin.    6. Discharge regimen:TBD, likely Lantus + orals    7. Follow-up: f/u w/ PCP Dr Rayburn Felt.    Thank you for this interesting consult, will cont to follow      Mellody Memos, MD  Assistant Clinical Professor of Medicine   Division of Endocrinology, Diabetes and Metabolism  [facility]

## 2014-06-22 NOTE — Interdisciplinary (Signed)
1st call MD Dr. Emily Filbert paged at 1415:    RE: [place], [place]. Just FYI: Pt complaining of SOB, pt on 2L NC 98%, I called IR and they said they cannot fit her in today for a paracentesis.

## 2014-06-22 NOTE — Plan of Care (Signed)
Problem: Alteration in Blood Glucose  Goal: Glucose level within specified parameters  Pt BG has been elevated the last couple of days. Diabetic RN visited pt and family and put a poster with carb information on the wall. This RN set up pt and family with EMMI videos. Will continue to monitor.

## 2014-06-22 NOTE — Progress Notes (Signed)
INPATIENT HEPATOLOGY/GASTROENTEROLOGY PROGRESS NOTE   Date:  06/22/2014   Author: Harle Stanford, MD    Current Hospital Stay:   2 days - Admitted on: 06/20/2014    Overnight Events/Subjective:      Medications:   calcium carbonate  600 mg BID    ciprofloxacin  500 mg Q24H    famotidine  20 mg Daily    furosemide  20 mg BID    insulin glargine  25 Units QAM    insulin lispro  1-10 Units 4x Daily WC    insulin lispro  8 Units TID WC    multivitamin with minerals  1 tablet Daily    rifaximin  550 mg Q12H    sodium chloride (PF)  3 mL Q8H    spironolactone  100 mg Daily       Objective:    Vital Signs:  Temperature:  [97 F (36.1 C)-98.6 F (37 C)] 98.3 F (36.8 C) (11/12 0726)  Blood pressure (BP): (90-125)/(45-52) 119/51 mmHg (11/12 0726)  Heart Rate:  [89-105] 89 (11/12 0726)  Respirations:  [16-18] 18 (11/12 0726)  Pain Score:  [-] 0 (11/12 0726)  O2 Device:  [-] Nasal cannula (11/12 0353)  O2 Flow Rate (L/min):  [2 l/min] 2 l/min (11/12 0353)  SpO2:  [95 %-100 %] 100 % (11/12 0726)    Intake/Output (Current Shift):  11/11 0600 - 11/12 0559  In: 466 [P.O.:460; I.V.:6]  Out: 950 [Urine:950]    Physical Exam:  General: NAD  HEENT: No scleral icterus,NC in place, trachea midline, mucus membranes moist.  Lungs: Decreased BS on R side  Cardiovascular: Irregular  Abdomen: Distended with mild fluid wave  Extremities: Edema present bilaterally  Skin: Non-icteric and no visible rash  Neuro: No asterixis    Laboratory data:   Recent Labs      06/20/14   0231  06/21/14   1011  06/22/14   0551   NA  131*  129*  136   K  4.0  4.0  4.0   CL  96*  94*  100   BICARB  23  24  24    BUN  18  18  20    CREAT  1.09*  1.01*  0.96*   [place]  8.8  8.6  8.5   TP  7.9   --   6.7   ALB  3.2*   --   2.8*   TBILI  1.84*   --   2.33*   AST  35*   --   28   ALT  37*   --   30   ALK  183*   --   148*       Recent Labs      06/20/14   0231  06/21/14   1011  06/22/14   0551   WBC  3.8*  4.2  4.8   HGB  13.3  12.2  12.3   HCT  36.4  34.0   35.1   MCV  90.8  91.6  91.6   PLT  50*  42*  49*   SEG  72*   --   75*   LYMPHS  16*   --   12*   MONOS  10   --   10   EOS  1   --   2       Recent Labs      06/20/14   0231  06/22/14   0551  INR  1.5  1.7       Imaging/Studies:  Abd u/s pending    Assessment/Plan:   73 yo hispanic female HCV GT 2 cirrhosis (achieved SVR) fairly well compensated. 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     #R pleural effusion: Last thoracentesis 12/2012 and suspected to be related to dietary noncompliance. No signs of infection or new onset CHF at this time. No indication for TIPS at this time    --still symptomatic with oxygen requirement despite IV lasix 38m BID and spironolactone with overall net negative for hospital stay.  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 Not currently taking BB per patient report.   *Ascites: No SBP on dx para 11/10. Cont cipro ppx. On IV lasix + spironolactone. Educate again on low salt diet  *HE: On rifaximin, unclear if previously intolerant of lactulose  *HCC: [facility] [place] [facility] [facility] for [place].  Abd u/s with doppler pending     DWA KSilverio Decamp MD  [facility]

## 2014-06-22 NOTE — Progress Notes (Addendum)
Daily Progress Note:  06/21/2014     Current Hospital Stay:   2 days - Admitted on: 06/20/2014    Subjective:  No acute events overnight. Pt thinks dyspnea is worse compared to yesterday.  No f/c, cough, wheezing.    Objective:    Vital Signs:  Temperature:  [97 F (36.1 C)-98.6 F (37 C)] 98.3 F (36.8 C) (11/12 0726)  Blood pressure (BP): (90-125)/(45-52) 119/51 mmHg (11/12 0726)  Heart Rate:  [89-105] 89 (11/12 0726)  Respirations:  [16-18] 18 (11/12 0726)  Pain Score:  [-] 0 (11/12 0726)  O2 Device:  [-] Nasal cannula (11/12 0353)  O2 Flow Rate (L/min):  [2 l/min] 2 l/min (11/12 0353)  SpO2:  [95 %-100 %] 100 % (11/12 0726)    Wt Readings from Last 1 Encounters:   06/20/14 55 kg (121 lb 4.1 oz)       Intake/Output (Current Shift):         Physical Exam:  GEN: elderly, thin female in NAD. Cooperative, responding appropriately.   HEENT: NCAT, neck supple, anicteric sclera, EOMI, OP clear   CV: rrr, no m/r/g   Pulmonary: decreased BS over entire R lung fields   Gastrointestinal: +NABS, soft but distended, + fluid wave, TTP RLQ, no rebound or guarding, abdominal scar RUQ s/p cholecystectomy, + reducible RUQ hernia  Neuro: no asterixis     Laboratory data:   Lab Results   Component Value Date    NA 136 06/22/2014    K 4.0 06/22/2014    CL 100 06/22/2014    BICARB 24 06/22/2014    BUN 20 06/22/2014    CREAT 0.96* 06/22/2014    GLU 189* 06/22/2014    [place] 8.5 06/22/2014     Lab Results   Component Value Date    WBC 4.8 06/22/2014    HGB 12.3 06/22/2014    HCT 35.1 06/22/2014    PLT 49* 06/22/2014    SEG 75* 06/22/2014    LYMPHS 12* 06/22/2014    MONOS 10 06/22/2014    EOS 2 06/22/2014     Lab Results   Component Value Date    AST 28 06/22/2014    ALT 30 06/22/2014    ALK 148* 06/22/2014    TBILI 2.33* 06/22/2014    TP 6.7 06/22/2014    ALB 2.8* 06/22/2014       Assessment and Plan:  Paula Manning is a 73 year old spanish speaking only female with PMHx of HepC cirrhosis s/p treatment with achievement of SVR, DM,  HTN who presents to the ED for shortness of breath x 5 days.     #. Sob 2/2 right pleural effusion: likely due to dietary indiscretion   Cont Diuresis with 20 IV lasix bid, aldactone 100   May need thoracentesis if doesn't respond to diuresis  - will consult IR for thoracentesis today  Strict I/O  Low Na diet   Nutrition consult for education on low salt diet     #. Abdominal pain:   Paracentesis negative for SBP on admission   Pain resolved today  Diuresis for ascites as above    #. CP: atypical, EKG without acute st/t wave changes. Resolved.    #. Hep C Cirrhosis: s/p treatment of Hep C with SVR achieved   Ascites-diuretics per above   HE-mental status clear, cont rifaxamin  EV-sm distal EV 5/13: propanolol dc'd at last admission 2/2 bradycardia   Continue cipro for SBP ppx   HCC: imaging  done in May     #. DM: on lantus and glipizide as outpatient.   Cont lantus, SSI - increased lantus and added mealtime coverage as BG consistently >300. Diabetes team consulted.    FEN: low sodium diet   PPx: SCDs, Hold chemoppx given thrombocytopenia  FC/FC

## 2014-06-23 ENCOUNTER — Encounter (HOSPITAL_COMMUNITY): Admission: EM | Disposition: A | Discharge status: Home Health | Payer: Self-pay | Attending: Internal Medicine

## 2014-06-23 LAB — PREPARE PLATELET PHERESIS: Platelet Pheresis, Irradiated: TRANSFUSED

## 2014-06-23 LAB — CBC WITH DIFF, BLOOD
ANC-Automated: 3.2 10*3/uL (ref 1.6–7.0)
Abs Eosinophils: 0.1 10*3/uL (ref 0.1–0.7)
Abs Lymphs: 0.5 10*3/uL — ABNORMAL LOW (ref 0.8–3.1)
Abs Monos: 0.5 10*3/uL (ref 0.2–0.8)
Basophils: 1 % (ref 0–2)
Eosinophils: 1 % (ref 1–4)
Hct: 33.5 % — ABNORMAL LOW (ref 34.0–45.0)
Hgb: 12.1 gm/dL (ref 11.2–15.7)
Lymphocytes: 13 % — ABNORMAL LOW (ref 19–53)
MCH: 32.9 pg — ABNORMAL HIGH (ref 26.0–32.0)
MCHC: 36.1 % — ABNORMAL HIGH (ref 32.0–36.0)
MCV: 91 um3 (ref 79.0–95.0)
MPV: 10.6 fL (ref 9.4–12.4)
Monocytes: 11 % (ref 5–12)
Plt Count: 43 10*3/uL — ABNORMAL LOW (ref 140–370)
Plt Est: DECREASED
RBC: 3.68 10*6/uL — ABNORMAL LOW (ref 3.90–5.20)
RDW: 15 % — ABNORMAL HIGH (ref 12.0–14.0)
Segs: 75 % — ABNORMAL HIGH (ref 34–71)
WBC: 4.3 10*3/uL (ref 4.0–10.0)

## 2014-06-23 LAB — LDH, OTHER: LDH, Other: 61 IU/L

## 2014-06-23 LAB — COMPREHENSIVE METABOLIC PANEL, BLOOD
ALT (SGPT): 30 U/L (ref 0–33)
AST (SGOT): 28 U/L (ref 0–32)
Albumin: 2.7 g/dL — ABNORMAL LOW (ref 3.5–5.2)
Alkaline Phos: 149 U/L — ABNORMAL HIGH (ref 35–140)
Anion Gap: 13 mmol/L (ref 7–15)
BUN: 22 mg/dL — ABNORMAL HIGH (ref 6–20)
Bicarbonate: 22 mmol/L (ref 22–29)
Bilirubin, Tot: 1.8 mg/dL — ABNORMAL HIGH (ref <1.20)
Calcium: 8.5 mg/dL (ref 8.5–10.6)
Chloride: 97 mmol/L — ABNORMAL LOW (ref 98–107)
Creatinine: 0.88 mg/dL (ref 0.51–0.95)
GFR: >60 mL/min
Glucose: 195 mg/dL — ABNORMAL HIGH (ref 70–115)
Potassium: 3.9 mmol/L (ref 3.5–5.1)
Sodium: 132 mmol/L — ABNORMAL LOW (ref 136–145)
Total Protein: 6.7 g/dL (ref 6.0–8.0)

## 2014-06-23 LAB — PREPARE FFP/THAWED PLASMA: Fresh Frozen Plasma: TRANSFUSED

## 2014-06-23 LAB — BODY FLUID CELL CT / DIFF
Fluid RBC mm3: 11000 uL
Fluid WBC mm3: 198 uL
Lymphocytes fluid %: 40 %
Macrophages fluid %: 57 %
Neutrophils Fluid %: 1 %
Other Cells Fluid %: 2 %

## 2014-06-23 LAB — PROTHROMBIN TIME, BLOOD
INR: 1.7
PT,Patient: 18.4 s — ABNORMAL HIGH (ref 9.7–12.5)

## 2014-06-23 LAB — TOTAL PROTEIN, OTHER: Total Protein, Other: 1 g/dL

## 2014-06-23 LAB — GLUCOSE (POCT)
Glucose (POCT): 185 mg/dL — ABNORMAL HIGH (ref 70–115)
Glucose (POCT): 315 mg/dL — ABNORMAL HIGH (ref 70–115)
Glucose (POCT): 209 mg/dL — ABNORMAL HIGH (ref 70–115)
Glucose (POCT): 198 mg/dL — ABNORMAL HIGH (ref 70–115)

## 2014-06-23 LAB — TYPE & SCREEN
ABO/RH: O POS
Antibody Screen: NEGATIVE

## 2014-06-23 LAB — PH, OTHER: Ph, Other: 7.68

## 2014-06-23 SURGERY — IR THORACENTESIS

## 2014-06-23 MED ORDER — INSULIN LISPRO (HUMAN) 100 UNIT/ML SC SOLN (CUSTOM)
10.0000 [IU] | Freq: Three times a day (TID) | INTRAMUSCULAR | Status: DC
Start: 2014-06-23 — End: 2014-06-23

## 2014-06-23 MED ORDER — INSULIN LISPRO (HUMAN) 100 UNIT/ML SC SOLN (CUSTOM)
1.0000 [IU] | Freq: Four times a day (QID) | INTRAMUSCULAR | Status: DC
Start: 2014-06-23 — End: 2014-06-24
  Administered 2014-06-23: 18:00:00 5 [IU] via SUBCUTANEOUS
  Filled 2014-06-23: qty 5

## 2014-06-23 MED ORDER — INSULIN LISPRO (HUMAN) 100 UNIT/ML SC SOLN (CUSTOM)
10.0000 [IU] | Freq: Three times a day (TID) | INTRAMUSCULAR | Status: DC
Start: 2014-06-23 — End: 2014-06-24
  Administered 2014-06-23: 09:00:00 10 [IU] via SUBCUTANEOUS
  Administered 2014-06-23: 18:00:00 5 [IU] via SUBCUTANEOUS
  Filled 2014-06-23: qty 10

## 2014-06-23 MED ORDER — SODIUM CHLORIDE 0.9 % IV SOLN
Freq: Once | INTRAVENOUS | Status: AC | PRN
Start: 2014-06-23 — End: 2014-06-24

## 2014-06-23 MED ORDER — INSULIN GLARGINE 100 UNIT/ML SC SOLN
30.0000 [IU] | Freq: Every morning | SUBCUTANEOUS | Status: DC
Start: 2014-06-23 — End: 2014-06-24
  Administered 2014-06-23: 30 [IU] via SUBCUTANEOUS
  Filled 2014-06-23: qty 30

## 2014-06-23 MED ORDER — LIDOCAINE, BUFFERED 1% IJ SOLN (COMPOUNDED)
INTRADERMAL | Status: DC | PRN
Start: 2014-06-23 — End: 2014-06-23
  Administered 2014-06-23: 5 mL via INTRADERMAL

## 2014-06-23 MED ORDER — INSULIN LISPRO (HUMAN) 100 UNIT/ML SC SOLN (CUSTOM)
1.0000 [IU] | Freq: Four times a day (QID) | INTRAMUSCULAR | Status: DC
Start: 2014-06-23 — End: 2014-06-23
  Administered 2014-06-23: 09:00:00 2 [IU] via SUBCUTANEOUS
  Administered 2014-06-23: 12:00:00 6 [IU] via SUBCUTANEOUS
  Filled 2014-06-23: qty 2
  Filled 2014-06-23: qty 6
  Filled 2014-06-23: qty 10

## 2014-06-23 SURGICAL SUPPLY — 3 items
CATHETER CENTESIS 1-STEP 4FR 7CM (Lines/Drains) IMPLANT
PREP CHLOROPREP 10.5ML 1-STEP (Misc Medical Supply)
TRAY THORACENTESIS (Kits/Sets/Trays) ×2 IMPLANT

## 2014-06-23 NOTE — Plan of Care (Signed)
Paged MD for blood consent need. Printed and put in chart ready for MD.

## 2014-06-23 NOTE — Consults (Signed)
Vascular and Interventional Radiology Consultation Note    Date: June 23, 2014   Patient Name: Panayiota Larkin   Medical Record #: [id number]   DOB: [birth year], Age: 73 year old  Sex: female    Requesting MD: Lambert Mody, MD    History of Present Illness:     Tionne Carelli is a 73 year old female, who is spanish speaking only, with a PMHx of HepC cirrhosis s/p treatment, DM, HTN.  She was presented to ER for worsening SOB over a 5 day period of time. She was found to have a right sided pleural effusion.  Medicine team attempted treatment with diuresis, and effusion did not respond. VIR consulted for diagnostic and therapeutic thoracentesis.    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    Type II or unspecified type diabetes mellitus with peripheral circulatory disorders, not stated as uncontrolled(250.70) 06/10/2007    HTN 06/10/2007    HTN 06/10/2007       Patient Active Problem List    Diagnosis Date Noted    Type 2 diabetes, uncontrolled, with neuropathy 06/22/2014    Diabetic polyneuropathy associated with type 2 diabetes mellitus 06/22/2014    SOB (shortness of breath) 06/20/2014    Group B streptococcal infection 11/04/2013    IPMN (intraductal papillary mucinous neoplasm) 06/23/2013    At risk for cancer: [facility] 06/23/2013    Acute dyspnea 12/15/2012    CAP (community acquired pneumonia) 11/30/2012    Hypoxia 11/30/2012    Nasal bone fx-closed 10/17/2011    Syncope 10/17/2011    Status post thoracentesis 12/28/2010    Pleural effusion  associated with hepatic disorder 12/27/2010    Fracture of proximal humerus 08/14/2009    Hypertensive disorder 12/12/2008    Hyperlipidemia 12/12/2008    Microalbuminuria 12/12/2008    Osteoporosis 07/26/2007    Ascites 06/29/2007     History of paracentesis x 2      Pancreatic cyst 06/29/2007     Likely intraductal papillary mucinous tumor of the pancreas (IPMT)  07/2004 EUS ([facility]) Benign appearing pancreatic neck cyst measuring 20 mm x 10 mm with internal septation.  07/2007 EUS [facility]) Multiple pancreatic cysts throughout the pancreatic body. The largest cyst measured 20 mm x 14 mm. No associated masses were seen to suggest malignancy. The ampulla appeared normal. 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  Blood type of O positive.  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        Hypertensive disorder 06/10/2007         ALLERGIES:No Known Allergies  Current Facility-Administered Medications   Medication    calcium carbonate suspension 600 mg    ciprofloxacin (CIPRO) tablet 500 mg    dextrose 50 % solution 12.5 g    famotidine (PEPCID) tablet 20 mg    furosemide (LASIX) injection 40 mg    glucagon (GLUCAGON) injection 1 mg    glucose chewable tablet 16 g    glucose oral gel 1 Tube    insulin glargine (LANTUS) injection 30 Units    insulin lispro (HUMALOG) injection 1-10 Units    insulin lispro (HUMALOG) injection 10 Units    multivitamin with minerals (THERA M PLUS) tablet 1 tablet    nalOXone (NARCAN) injection 0.1 mg    ondansetron (ZOFRAN) injection  4 mg    rifaximin (XIFAXAN) tablet 550 mg    sodium chloride (PF) 0.9 % flush 3 mL    sodium chloride (PF) 0.9 % flush 3 mL    sodium chloride 0.9 % 100 mL IV flush    sodium chloride 0.9% infusion    spironolactone (ALDACTONE) tablet 100 mg     Anticoagulants   Medication Dose Frequency       Review of Systems:  Review of Systems - Constitutional: negative for: fatigue, night sweats, anorexia.  CV: paroxysmal nocturnal dyspnea, orthopnea.  Resp: cough, shortness of breath, dyspnea on exertion.  Musculoskeletal: negative for: joint pain, joint redness, muscle weakness.    Physical Exam  BP 115/75 mmHg   Pulse 93   Temp(Src) 98 F (36.7 C)   Resp 22   Ht 5' 1"  (1.549 m)   Wt 54.9 kg (121 lb 0.5 oz)   BMI 22.88 kg/m2   SpO2 95%  GEN: elderly, thin female in NAD. Cooperative, responding appropriately.   HEENT: NCAT, neck supple, anicteric sclera, EOMI, OP clear   CV: rrr, no m/r/g   Pulmonary: decreased BS over entire R lung fields   Gastrointestinal: +NABS, soft but distended, nt   Neuro: no asterixis   Laboratory data:   Lab Results   Component Value Date    INR 1.7 06/23/2014    PTT 21.4 11/01/2013     Lab Results   Component Value Date    WBC 4.3 06/23/2014    HGB 12.1 06/23/2014    HCT 33.5 06/23/2014    PLT 43 06/23/2014    PLT 47 04/23/2009    LYMPHS 13 06/23/2014    LYMPHS 16 05/18/2008     Lab Results   Component Value Date    NA 132 06/23/2014    K 3.9 06/23/2014    CL 97 06/23/2014    BICARB 22 06/23/2014    BUN 22 06/23/2014    CREAT 0.88 06/23/2014    GLU 195 06/23/2014    [place] 8.5 06/23/2014     Lab Results   Component Value Date    AST 28 06/23/2014    ALT 30 06/23/2014    ALK 149 06/23/2014    TBILI 1.80 06/23/2014    DBILI 0.4 08/31/2012    TP 6.7 06/23/2014    ALB 2.7 06/23/2014       Imaging:   CXR 06/19/14      Assessment and Plan:  In summary, this patient is a 73 year old female HCV cirrhosis and hepatic hydrothorax. She  continues to feel very short of breath and "heavy in her chest."  VIR  consulted for thoracentesis.    1. Plan for US guided thoracentesis.   2. Use of blue phone interpreter for procedural consent.    3. FFP and Platelets prior to procedure.      The risks, benefits and alternatives of the planned procedure, including bleeding, infection, organ damage, and vessel damage, have been discussed with the patient and/or her legal representative, all questions have been answered and they agree to proceed.      Lavada Mesi, DNP, FNP-C, AG ACNP-BC  Vascular Interventional Radiology

## 2014-06-23 NOTE — Interdisciplinary (Signed)
06/23/14 1642   Patient Information   Why is Patient in the Hospital? 73 yo F,  w/ uncontrolled DM2 c/b neuropathy as well as HepC cirrhosis, HTN admitted 06/20/2014 w/ 5 days SOB and found to have R pleural effusion thought 2/2 dietary indiscretion.    Prior to Level of Function Use Assisted Devices  (Independent with toileting, but spouse assists in changing her clothes,/ADLs.)   Assistive Device Gilford Rile  [phone] with seat)   [place] Other  (Retired. )   Referral To   Pensions consultant Other (Comment)  ([employer])   Community Resources Other (Comment)  (PCP - DR. Crissie Reese , [facility] [address] [place], [place]. )   Discharge Convent / significant other  (Lives wtih spouse in a single level home with 1 step to get inside @ [address] [place], [place].)   Support Systems Spouse / significant other;Children  (Spouse - Elyanna Wallick @ [phone]. Son -Sherrye Puga @ [phone].)   Type of Residence Private residence   [place] No   Type of Home Care Services None   Patient expects to be discharged to: Home with spouse pending clinical course and final TEAM recommendations.    Do you have transportation home?  Yes  (family will provide transportation upon d/c. )   Patient alert, o x 4, moving all extremities, uses 4WW to ambulate, lives with spouse in a single level duplex home, address in facesheet confirmed.  Psychologist, educational Pharmacy - @ [address] [place], [place] [address]. Tel# [phone].  Patient chedcks blood sugar 2x daily, sposue administers insulin @ HS.  No discharge needs at this time, patient and sposue denies any needs.  CM will continue to follow.

## 2014-06-23 NOTE — Plan of Care (Signed)
Problem: Pain - Acute  Goal: Control of acute pain  Outcome: Met  Pt denies pain at this time. Will continue to assess.    Problem: Breathing Pattern - Ineffective  Goal: Respiratory rate, rhythm and depth return to baseline  Pt is on 2L NC sating in mid 90's. Breathing is even and unlabored, HOB at 30 degrees. Will continue to assess    Problem: Mobility - Impaired  Goal: Able to ambulate within specified parameters  Pt is able to ambulate to bedside commode with assist. Pt has walker that she uses outside the room. Will continue to assess.    Problem: Falls, Risk of  Goal: Keep patient free from falls utilizing universal fall precautions  Outcome: Met  Performed frequent rounds, side rails up x 2, call light and bedside table within reach, non-skid yellow socks on, bed locked and in lowest position, room free from clutter. Instructed pt to use call light for assistance. Family is at bedside. No falls reported. Will continue to assess.         Problem: Discharge Planning  Goal: Participation in care planning  Pt is calm and cooperative with plan of care. Pt's family are at bedside and appear to be supportive. No d/c orders at this time.

## 2014-06-23 NOTE — Progress Notes (Signed)
Endocrine/Diabetes Progress Note    Overnight: no acute events    Subjective: feeling OK, up eating breakfast    Objective:  Temperature:  [97.1 F (36.2 C)-98 F (36.7 C)] 98 F (36.7 C) (11/13 1415)  Blood pressure (BP): (112-140)/(44-80) 115/75 mmHg (11/13 1415)  Heart Rate:  [91-106] 93 (11/13 1415)  Respirations:  [18-24] 22 (11/13 1415)  Pain Score:  [-] 0 (11/13 1415)  O2 Device:  [-] Nasal cannula (11/13 1415)  O2 Flow Rate (L/min):  [2 l/min] 2 l/min (11/13 1415)  SpO2:  [94 %-100 %] 95 % (11/13 1415)  Body mass index is 22.88 kg/(m^2).    GEN: NAD  PULM: breathing comfortably    Diet: 2 gram sodium + carb limited    Blood Sugars reviewed:      AM Lu Di HS O/N      11/11  [phone]  lantus  15 5  Lispro  +3 5+4 8+3 -    11/12  188 278  Lantus  25  Lispro  4+1 8+3      A/P:  Paula Manning is a 73 year old female w/ uncontrolled DM2 c/b neuropathy as well as HepC cirrhosis, HTN admitted 06/20/2014 w/ 5 days SOB and found to have R pleural effusion thought 2/2 dietary indiscretion.     BG consistently elevated, will increase all insulins today    Inpatient Recs:  - Incr lantus 25--->30 units, but move to q noon dosing  - Incr lispro 8--->10 units ac tid, RN to give 0, 1/2, whole dose based on % tray consumed  - Increase Mod-->high correction scale w lispro ac/hs    Outpatient Recs/Education:    1. Self-monitor blood glucose: has meter covered per insurance and strips    2. Healthy Eating: reviewed cho groups with family and pt, nurse to show Emmi videos    3. Activity:per MD    4. Hypoglycemia:will cover tomorrow    5. Medication: likely modify dosages, but same, oral and insulin.    6. Discharge regimen:TBD, likely Lantus + orals    7. Follow-up: f/u w/ PCP Dr Rayburn Felt.    Will cont to follow

## 2014-06-23 NOTE — Progress Notes (Signed)
Daily Progress Note:  06/21/2014     Current Hospital Stay:   3 days - Admitted on: 06/20/2014    Subjective:  No acute events overnight. Cont to c/o dyspnea.    Objective:    Vital Signs:  Temperature:  [97.1 F (36.2 C)-98.4 F (36.9 C)] 97.9 F (36.6 C) (11/13 0720)  Blood pressure (BP): (112-140)/(53-80) 128/53 mmHg (11/13 0720)  Heart Rate:  [88-106] 106 (11/13 0720)  Respirations:  [18-20] 20 (11/13 0720)  Pain Score:  [-] 0 (11/13 0720)  O2 Device:  [-] Nasal cannula (11/13 0500)  O2 Flow Rate (L/min):  [2 l/min] 2 l/min (11/13 0500)  SpO2:  [94 %-99 %] 94 % (11/13 0720)    Wt Readings from Last 1 Encounters:   06/22/14 54.9 kg (121 lb 0.5 oz)       Intake/Output (Current Shift):  11/13 0600 - 11/13 1759  In: 72 [P.O.:60]  Out: -       Physical Exam:  GEN: elderly, thin female in NAD. Cooperative, responding appropriately.   HEENT: NCAT, neck supple, anicteric sclera, EOMI, OP clear   CV: rrr, no m/r/g   Pulmonary: decreased BS over entire R lung fields   Gastrointestinal: +NABS, soft but distended, nt   Neuro: no asterixis     Laboratory data:   Lab Results   Component Value Date    NA 132* 06/23/2014    K 3.9 06/23/2014    CL 97* 06/23/2014    BICARB 22 06/23/2014    BUN 22* 06/23/2014    CREAT 0.88 06/23/2014    GLU 195* 06/23/2014    [place] 8.5 06/23/2014     Lab Results   Component Value Date    WBC 4.3 06/23/2014    HGB 12.1 06/23/2014    HCT 33.5* 06/23/2014    PLT 49* 06/22/2014    SEG 75* 06/23/2014    LYMPHS 13* 06/23/2014    MONOS 11 06/23/2014    EOS 1 06/23/2014     Lab Results   Component Value Date    AST 28 06/23/2014    ALT 30 06/23/2014    ALK 149* 06/23/2014    TBILI 1.80* 06/23/2014    TP 6.7 06/23/2014    ALB 2.7* 06/23/2014       Assessment and Plan:  Yamilett Anastos is a 73 year old spanish speaking only female with PMHx of HepC cirrhosis s/p treatment with achievement of SVR, DM, HTN who presents to the ED for shortness of breath x 5 days.     #. Sob 2/2 right pleural effusion:  likely due to dietary indiscretion   Cont Diuresis with 40 IV lasix bid, aldactone 100   IR consulted for thoracentesis today  Strict I/O  Low Na diet   Nutrition consulted for education on low salt diet, appreciate recs     #. Hep C Cirrhosis: s/p treatment of Hep C with SVR achieved   Ascites-diuretics per above   HE-mental status clear, cont rifaxamin  EV-sm distal EV 5/13: propanolol dc'd at last admission 2/2 bradycardia   Continue cipro for SBP ppx   HCC: imaging done in May     #. DM: on lantus and glipizide as outpatient.   Cont lantus, SSI - increased lantus and added mealtime coverage as BG consistently >300. Diabetes team consulted, appreciate recs.    FEN: low sodium diet   PPx: SCDs, Hold chemoppx given thrombocytopenia  FC/FC

## 2014-06-23 NOTE — Progress Notes (Signed)
INPATIENT HEPATOLOGY/GASTROENTEROLOGY PROGRESS NOTE   Date:  06/23/2014   Author: Harle Stanford, MD    Current Hospital Stay:   3 days - Admitted on: 06/20/2014    Overnight Events/Subjective:    --still c/o SOB, unable to undergo thoracentesis yesterday with VIR but remains on schedule for today  --no other complaints    Medications:   calcium carbonate  600 mg BID    ciprofloxacin  500 mg Q24H    famotidine  20 mg Daily    furosemide  40 mg BID    insulin glargine  30 Units QAM    insulin lispro  1-10 Units 4x Daily WC    insulin lispro  10 Units TID WC    multivitamin with minerals  1 tablet Daily    rifaximin  550 mg Q12H    sodium chloride (PF)  3 mL Q8H    spironolactone  100 mg Daily       Objective:    Vital Signs:  Temperature:  [97.1 F (36.2 C)-98.4 F (36.9 C)] 97.9 F (36.6 C) (11/13 0720)  Blood pressure (BP): (112-140)/(53-80) 128/53 mmHg (11/13 0720)  Heart Rate:  [88-106] 106 (11/13 0720)  Respirations:  [18-20] 20 (11/13 0720)  Pain Score:  [-] 0 (11/13 0720)  O2 Device:  [-] Nasal cannula (11/13 0500)  O2 Flow Rate (L/min):  [2 l/min] 2 l/min (11/13 0500)  SpO2:  [94 %-99 %] 94 % (11/13 0720)    Intake/Output (Current Shift):  11/12 0600 - 11/13 0559  In: 016 [P.O.:727; I.V.:3]  Out: 1075 [Urine:1075]    Physical Exam:  General: NAD  HEENT: No scleral icterus,NC in place, trachea midline, mucus membranes moist.  Lungs: Decreased BS on R side  Cardiovascular: Irregular  Abdomen: Distended with mild fluid wave  Extremities: Edema present bilaterally  Skin: Non-icteric and no visible rash  Neuro: No asterixis    Laboratory data:   Recent Labs      06/21/14   1011  06/22/14   0551  06/23/14   0601   NA  129*  136  132*   K  4.0  4.0  3.9   CL  94*  100  97*   BICARB  24  24  22    BUN  18  20  22*   CREAT  1.01*  0.96*  0.88   [place]  8.6  8.5  8.5   TP   --   6.7  6.7   ALB   --   2.8*  2.7*   TBILI   --   2.33*  1.80*   AST   --   28  28   ALT   --   30  30   ALK   --   148*  149*              Recent Labs      06/21/14   1011  06/22/14   0551  06/23/14   0600   WBC  4.2  4.8  4.3   HGB  12.2  12.3  12.1   HCT  34.0  35.1  33.5*   MCV  91.6  91.6  91.0   PLT  42*  49*  43*   SEG   --   75*  75*   LYMPHS   --   12*  13*   MONOS   --   10  11   EOS   --  2  1       Recent Labs      06/22/14   0551  06/23/14   0601   INR  1.7  1.7       Imaging/Studies:  ABD U/S  IMPRESSION:  1. Large right pleural effusion.    2. Cirrhotic liver with sequela of portal hypertension including ascites and   splenomegaly.    3. 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 The   right and left portal veins remain patent with flow in the proper direction.    4. 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     #R pleural effusion: Last thoracentesis 12/2012 and suspected to be related to dietary noncompliance. No signs of infection or new onset CHF at this time. No indication for TIPS at this time    --still symptomatic with oxygen requirement despite IV lasix 36m BID and spironolactone with overall net negative for hospital stay.  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 Not currently taking BB per patient report  *PVT: Appears stable without immediate plans to anticoagulate.   *Ascites: No SBP on dx para 11/10. Cont cipro ppx. On IV lasix + spironolactone. Educate again on low salt diet  *HE: On rifaximin, unclear if previously intolerant of lactulose  *HCC: Imaging from 06/2014 without HCCushingSerial imaging for IPMN    DWA [place]    AlHarle StanfordMD  [facility]

## 2014-06-23 NOTE — Plan of Care (Signed)
Problem: Pain - Acute  Goal: Control of acute pain  Outcome: Met  Assess for pain. Pt denies pain at this time. Will monitor for new or unresolved pain.    Problem: Breathing Pattern - Ineffective  Goal: Respiratory rate, rhythm and depth return to baseline  Outcome: Met  Assess for ineffective breathing pattern. Respiration even and unlabored without shortness of breath with oxygen at 2 liters nasal cannula. Pt does have dyspnea upon exertion. Lungs clear and diminished. Will monitor for ineffective breathing pattern.    Problem: Mobility - Impaired  Goal: Able to ambulate within specified parameters  Outcome: Met  Assess for impaired mobility. Pt uses assist with transfers and ambulation and has cane for ambulating distances. Nonskid footwear in place. Will monitor for impaired mobility.    Problem: Falls, Risk of  Goal: Keep patient free from falls utilizing universal fall precautions  Outcome: Met  Assess for risk of falls. Bed brakes locked and bed in low position with side rails up times two. Call bell and belongings within reach. Family at bedside. Frequent rounding with needs attended. Nonskid footwear in place. No falls this shift. Will monitor for risk of falls.    Problem: Discharge Planning  Goal: Participation in care planning  Outcome: Met  Assess for discharge planning. All disciplines working on discharge plans. No orders for discharge at this time. Will update and educate pt as plan of care changes. Will monitor for discharge orders.    Problem: Fluid Volume Excess  Goal: Absence of edema; peripheral and/or pulmonary  Outcome: Met  Assess for fluid volume excess. Labs monitored. Trace edema noted. Diuretics given as ordered. Will monitor for fluid volume excess.    Problem: Skin Integrity- Intact patient high risk for impaired skin integrity Braden scale less than or equal to 18  Goal: Skin remains intact  Outcome: Met  Assess for impaired skin integrity. Pt able to turn self in bed and understands  the importance in turning self. Discoloration to right ankle that pt states she has had for 20 yrs. Dressing clean, dry and intact to right AC. Will monitor for impaired skin integrity.    Problem: Alteration in Blood Glucose  Goal: Glucose level within specified parameters  Outcome: Not Met  Assess for alteration in blood glucose. Blood glucose still high and medications adjusted per MD orders. Insulin given as ordered and needed. Will monitor for alteration in blood glucose.

## 2014-06-23 NOTE — Plan of Care (Signed)
Pt arrived back to room s/p thoracentesis. Dressing to right upper back clean, dry and intact without drainage. Family at bedside. Pt denies pain. Vitals stable. Will continue to monitor.

## 2014-06-24 LAB — CBC WITH DIFF, BLOOD
ANC-Automated: 2.7 10*3/uL (ref 1.6–7.0)
Abs Eosinophils: 0.1 10*3/uL (ref 0.1–0.7)
Abs Lymphs: 0.6 10*3/uL — ABNORMAL LOW (ref 0.8–3.1)
Abs Monos: 0.5 10*3/uL (ref 0.2–0.8)
Basophils: 1 % (ref 0–2)
Eosinophils: 2 % (ref 1–4)
Hct: 32.5 % — ABNORMAL LOW (ref 34.0–45.0)
Hgb: 11.5 gm/dL (ref 11.2–15.7)
Lymphocytes: 15 % — ABNORMAL LOW (ref 19–53)
MCH: 32.5 pg — ABNORMAL HIGH (ref 26.0–32.0)
MCHC: 35.4 % (ref 32.0–36.0)
MCV: 91.8 um3 (ref 79.0–95.0)
MPV: 10.4 fL (ref 9.4–12.4)
Monocytes: 12 % (ref 5–12)
Plt Count: 45 10*3/uL — ABNORMAL LOW (ref 140–370)
RBC: 3.54 10*6/uL — ABNORMAL LOW (ref 3.90–5.20)
RDW: 15 % — ABNORMAL HIGH (ref 12.0–14.0)
Segs: 71 % (ref 34–71)
WBC: 3.8 10*3/uL — ABNORMAL LOW (ref 4.0–10.0)

## 2014-06-24 LAB — COMPREHENSIVE METABOLIC PANEL, BLOOD
ALT (SGPT): 29 U/L (ref 0–33)
AST (SGOT): 30 U/L (ref 0–32)
Albumin: 3 g/dL — ABNORMAL LOW (ref 3.5–5.2)
Alkaline Phos: 138 U/L (ref 35–140)
Anion Gap: 12 mmol/L (ref 7–15)
BUN: 22 mg/dL — ABNORMAL HIGH (ref 6–20)
Bicarbonate: 27 mmol/L (ref 22–29)
Bilirubin, Tot: 2.15 mg/dL — ABNORMAL HIGH (ref <1.20)
Calcium: 8.9 mg/dL (ref 8.5–10.6)
Chloride: 97 mmol/L — ABNORMAL LOW (ref 98–107)
Creatinine: 1.03 mg/dL — ABNORMAL HIGH (ref 0.51–0.95)
GFR: 53 mL/min
Glucose: 122 mg/dL — ABNORMAL HIGH (ref 70–115)
Potassium: 3.9 mmol/L (ref 3.5–5.1)
Sodium: 136 mmol/L (ref 136–145)
Total Protein: 7.2 g/dL (ref 6.0–8.0)

## 2014-06-24 LAB — GLUCOSE (POCT)
Glucose (POCT): 122 mg/dL — ABNORMAL HIGH (ref 70–115)
Glucose (POCT): 251 mg/dL — ABNORMAL HIGH (ref 70–115)
Glucose (POCT): 234 mg/dL — ABNORMAL HIGH (ref 70–115)
Glucose (POCT): 271 mg/dL — ABNORMAL HIGH (ref 70–115)

## 2014-06-24 LAB — PROTHROMBIN TIME, BLOOD
INR: 1.6
PT,Patient: 17.8 s — ABNORMAL HIGH (ref 9.7–12.5)

## 2014-06-24 MED ORDER — INSULIN LISPRO (HUMAN) 100 UNIT/ML SC SOLN (CUSTOM)
1.0000 [IU] | Freq: Four times a day (QID) | INTRAMUSCULAR | Status: DC
Start: 2014-06-24 — End: 2014-06-25
  Administered 2014-06-24: 14:00:00 5 [IU] via SUBCUTANEOUS
  Administered 2014-06-24: 19:00:00 4 [IU] via SUBCUTANEOUS
  Administered 2014-06-24: 21:00:00 3 [IU] via SUBCUTANEOUS
  Filled 2014-06-24: qty 3
  Filled 2014-06-24: qty 4

## 2014-06-24 MED ORDER — SPIRONOLACTONE 25 MG OR TABS
150.0000 mg | ORAL_TABLET | Freq: Every day | ORAL | Status: DC
Start: 2014-06-25 — End: 2014-06-26
  Administered 2014-06-25 – 2014-06-26 (×2): 150 mg via ORAL
  Filled 2014-06-24 (×2): qty 1

## 2014-06-24 MED ORDER — INSULIN LISPRO (HUMAN) 100 UNIT/ML SC SOLN (CUSTOM)
8.0000 [IU] | Freq: Three times a day (TID) | INTRAMUSCULAR | Status: DC
Start: 2014-06-24 — End: 2014-06-25
  Administered 2014-06-24 – 2014-06-25 (×2): 8 [IU] via SUBCUTANEOUS
  Filled 2014-06-24 (×2): qty 8

## 2014-06-24 MED ORDER — INSULIN GLARGINE 100 UNIT/ML SC SOLN
26.0000 [IU] | Freq: Every day | SUBCUTANEOUS | Status: DC
Start: 2014-06-24 — End: 2014-07-01
  Administered 2014-06-24 – 2014-06-30 (×7): 26 [IU] via SUBCUTANEOUS
  Filled 2014-06-24 (×7): qty 26

## 2014-06-24 MED ORDER — FUROSEMIDE 10 MG/ML IJ SOLN
20.0000 mg | Freq: Two times a day (BID) | INTRAMUSCULAR | Status: DC
Start: 2014-06-24 — End: 2014-06-24
  Administered 2014-06-24: 20 mg via INTRAVENOUS
  Filled 2014-06-24: qty 4

## 2014-06-24 MED ORDER — INSULIN LISPRO (HUMAN) 100 UNIT/ML SC SOLN (CUSTOM)
4.0000 [IU] | Freq: Three times a day (TID) | INTRAMUSCULAR | Status: DC
Start: 2014-06-24 — End: 2014-06-25
  Administered 2014-06-24 – 2014-06-25 (×3): 4 [IU] via SUBCUTANEOUS
  Filled 2014-06-24 (×3): qty 4

## 2014-06-24 MED ORDER — SPIRONOLACTONE 25 MG OR TABS
50.0000 mg | ORAL_TABLET | Freq: Once | ORAL | Status: AC
Start: 2014-06-24 — End: 2014-06-24
  Administered 2014-06-24: 50 mg via ORAL
  Filled 2014-06-24: qty 2

## 2014-06-24 MED ORDER — FUROSEMIDE 10 MG/ML IJ SOLN
60.0000 mg | Freq: Every day | INTRAMUSCULAR | Status: DC
Start: 2014-06-24 — End: 2014-06-26
  Administered 2014-06-24 – 2014-06-26 (×4): 60 mg via INTRAVENOUS
  Filled 2014-06-24 (×3): qty 8

## 2014-06-24 NOTE — Plan of Care (Signed)
Problem: Fluid Volume Excess  Goal: Balanced intake and output  Outcome: Met  Assess for fluid volume excess. Trace edema noted. Diuretics given as ordered. Follows fluid restriction. Labs monitored. Daily wt. Will monitor for fluid volume excess.    Problem: Skin Integrity- Intact patient high risk for impaired skin integrity Braden scale less than or equal to 18  Goal: Skin remains intact  Outcome: Met  Assess for impaired skin integrity. Dressing clean, dry and intact to right upper back from thoracentesis. Ecchymotic and discolored areas noted to skin. Pt able to ambulate and turns self. Will monitor for impaired skin integrity.     Problem: Alteration in Blood Glucose  Goal: Glucose level within specified parameters  Outcome: Met  Assess for alteration in blood glucose. Blood glucose monitored AC&HS and insulin given per MD orders. Insulin adjusted per MD and diabetic RN. Will monitor for alteration in blood glucose.

## 2014-06-24 NOTE — Progress Notes (Signed)
Endocrine/Diabetes Progress Note    Overnight: no acute events, per RN pt not taking meals at scheduled times as not always hungry right away.    Objective:  Temperature:  [98 F (36.7 C)-98.3 F (36.8 C)] 98.2 F (36.8 C) (11/14 1126)  Blood pressure (BP): (107-123)/(48-75) 109/48 mmHg (11/14 1127)  Heart Rate:  [86-105] 105 (11/14 1127)  Respirations:  [18-22] 20 (11/14 1126)  Pain Score:  [-] 0 (11/14 1126)  O2 Device:  [-] Nasal cannula (11/14 1126)  O2 Flow Rate (L/min):  [2 l/min] 2 l/min (11/14 1126)  SpO2:  [94 %-97 %] 96 % (11/14 1126)  Body mass index is 22.31 kg/(m^2).      Diet: CHO Limited + Glucerna tid    Blood Sugars reviewed:      AM Lu Di HS O/N      11/11  269 399 253 181  lantus  15 5  Lispro  +3 5+4 8+3 -    11/12  188 278 199 265  Lantus  25  Lispro  4+1 8+3 4+1 2    11.13.15 185 315 209 198  Lantus  30  Lispro  10+2 6 5+5    11.14.15 122 251  Lantus   26  Lispro  8+4     A/P:  Paula Manning is a 73 year old female w/ uncontrolled DM2 c/b neuropathy as well as HepC cirrhosis, HTN admitted 06/20/2014 w/ 5 days SOB and found to have R pleural effusion thought 2/2 dietary indiscretion.     Fasting glucose down 70 mg/dL overnight on increased basal dosing yesterday, and pt receiving glucerna supplement without prandial coverage, recommendations for improved insulin:CHO matching as below and decreased basal insulin based on the drop in noct BG w.out RA insulin. Discussed with RN Rollene Fare attempting to check glucose POCT just prior to pt taking meals for best insulin:CHO matching and most appropriate correction.    Inpatient Recs: paged to first call   - Decrease lantus 30--->26 units and retime for daily before lunch  - Decrease lispro 10--->8 units ac tid, RN to give 0, 1/2, whole dose based on % tray consumed  - Start Lispro 4 units tid AC, "FOR Glucerna COVERAGE, do not give if pt not drinking supplement."  - Decrease High -> Moderate correction scale w lispro ac/hs    Outpatient  Recs/Education:    1. Self-monitor blood glucose: has meter covered per insurance and strips    2. Healthy Eating: reviewed cho groups with family and pt, nurse to show Emmi videos    3. Activity:per MD    4. Hypoglycemia:will cover tomorrow    5. Medication: likely modify dosages, but same, oral and insulin.    6. Discharge regimen:TBD, likely Lantus + orals    7. Follow-up: f/u w/ PCP Dr Rayburn Felt.    Will cont to follow

## 2014-06-24 NOTE — Progress Notes (Signed)
INPATIENT HEPATOLOGY/GASTROENTEROLOGY PROGRESS NOTE   Date:  06/24/2014   Author: Edrick Kins, MD    Current Hospital Stay:   4 days - Admitted on: 06/20/2014    Overnight Events/Subjective:    - thoracentesis yest with 1.3L removed, studies c/w hyrdothorax  - I/Os not well recorded, remains on O2     Medications:   calcium carbonate  600 mg BID    ciprofloxacin  500 mg Q24H    famotidine  20 mg Daily    furosemide  60 mg Daily    insulin glargine  26 Units Daily before lunch    insulin lispro  1-10 Units 4x Daily WC    insulin lispro  4 Units TID WC    insulin lispro  8 Units TID WC    multivitamin with minerals  1 tablet Daily    rifaximin  550 mg Q12H    sodium chloride (PF)  3 mL Q8H    [START ON 06/25/2014] spironolactone  150 mg Daily       Objective:    Vital Signs:  Temperature:  [98.1 F (36.7 C)-98.7 F (37.1 C)] 98.7 F (37.1 C) (11/14 1600)  Blood pressure (BP): (107-123)/(48-58) 111/56 mmHg (11/14 1600)  Heart Rate:  [86-105] 95 (11/14 1600)  Respirations:  [18-20] 20 (11/14 1600)  Pain Score:  [-] 0 (11/14 1600)  O2 Device:  [-] Nasal cannula (11/14 1600)  O2 Flow Rate (L/min):  [2 l/min] 2 l/min (11/14 1600)  SpO2:  [94 %-97 %] 94 % (11/14 1600)    Intake/Output (Current Shift):  11/13 0600 - 11/14 0559  In: 1020 [P.O.:1014; I.V.:6]  Out: 400 [Urine:400]    Physical Exam:  General: NAD  HEENT: No scleral icterus,NC in place, trachea midline, mucus membranes moist.  Lungs: Decreased BS on R side  Cardiovascular: Irregular  Abdomen: Distended with mild fluid wave  Extremities: Edema present bilaterally  Skin: Non-icteric and no visible rash  Neuro: No asterixis    Laboratory data:   Recent Labs      06/22/14   0551  06/23/14   0601  06/24/14   0531   NA  136  132*  136   K  4.0  3.9  3.9   CL  100  97*  97*   BICARB  _0 BUN  20  22*  22*   CREAT  0.96*  0.88  1.03*   [place]  8.5  8.5  8.9   TP  6.7  6.7  7.2   ALB  2.8*  2.7*  3.0*   TBILI  2.33*  1.80*  2.15*   AST  _1 ALT  _2 ALK  148*  149*  138       Recent Labs      06/22/14   0551  06/23/14   0600  06/24/14   0531   WBC  4.8  4.3  3.8*   HGB  12.3  12.1  11.5   HCT  35.1  33.5*  32.5*   MCV  91.6  91.0  91.8   PLT  49*  43*  45*   SEG  75*  75*  71   LYMPHS  12*  13*  15*   MONOS  _3 EOS  2  1  2  Recent Labs      06/22/14   0551  06/23/14   0601  06/24/14   0531   INR  1.7  1.7  1.6       Imaging/Studies:  ABD U/S  IMPRESSION:  1. Large right pleural effusion.    2. Cirrhotic liver with sequela of portal hypertension including ascites and   splenomegaly.    3. 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 The   right and left portal veins remain patent with flow in the proper direction.    4. 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     #R pleural effusion: Repeat thoracentesis c/w hydrothorax. No signs of infection or new onset CHF at this time. No indication for TIPS at this time  -- adjust diuretics to lasix 60IV daily, increase aldactone to 150 daily  -- strict IOs  -- daily wts  -- low Na diet  -- TTE Monday, if not reponding to diuretics, may need to consider TIPS in future  -- albumin with diuretics if < 3    #HCV GT2 cirrhosis with SVR c/b EV/ascites/hydrothorax: MELD 14. 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 Not currently taking BB per patient report  *PVT: Appears stable  without immediate plans to anticoagulate.   *Ascites: No SBP on dx para 11/10. Cont cipro ppx. On IV lasix + spironolactone. Educate again on low salt diet  *HE: On rifaximin, unclear if previously intolerant of lactulose  *HCC: Imaging from 06/2014 without [place]. Serial imaging for IPMN    DWA [place]    Edrick Kins, MD  [facility]  [phone]

## 2014-06-24 NOTE — Progress Notes (Signed)
Daily Progress Note:  06/21/2014     Current Hospital Stay:   4 days - Admitted on: 06/20/2014    Subjective:  No acute events overnight. Cont to c/o dyspnea.    Objective:    Vital Signs:  Temperature:  [97.8 F (36.6 C)-98.3 F (36.8 C)] 98.3 F (36.8 C) (11/14 0417)  Blood pressure (BP): (107-137)/(44-75) 117/53 mmHg (11/14 0417)  Heart Rate:  [86-105] 86 (11/14 0417)  Respirations:  [18-24] 18 (11/14 0417)  Pain Score:  [-] 0 (11/14 0417)  O2 Device:  [-] Nasal cannula (11/14 0417)  O2 Flow Rate (L/min):  [2 l/min] 2 l/min (11/14 0417)  SpO2:  [94 %-100 %] 95 % (11/14 0417)    Wt Readings from Last 1 Encounters:   06/24/14 53.524 kg (118 lb)       Intake/Output (Current Shift):  11/14 0600 - 11/14 1759  In: 3 [I.V.:3]  Out: 100 [Urine:100]      Physical Exam:  GEN: elderly, thin female in NAD. Cooperative, responding appropriately.   HEENT: NCAT, neck supple, anicteric sclera, EOMI, OP clear   CV: rrr, no m/r/g   Pulmonary: decreased BS over entire R lung fields   Gastrointestinal: +NABS, soft but distended, nt   Neuro: no asterixis     Laboratory data:   Lab Results   Component Value Date    NA 136 06/24/2014    K 3.9 06/24/2014    CL 97* 06/24/2014    BICARB 27 06/24/2014    BUN 22* 06/24/2014    CREAT 1.03* 06/24/2014    GLU 122* 06/24/2014    [place] 8.9 06/24/2014     Lab Results   Component Value Date    WBC 3.8* 06/24/2014    HGB 11.5 06/24/2014    HCT 32.5* 06/24/2014    PLT 45* 06/24/2014    SEG 71 06/24/2014    LYMPHS 15* 06/24/2014    MONOS 12 06/24/2014    EOS 2 06/24/2014     Lab Results   Component Value Date    AST 30 06/24/2014    ALT 29 06/24/2014    ALK 138 06/24/2014    TBILI 2.15* 06/24/2014    TP 7.2 06/24/2014    ALB 3.0* 06/24/2014       Assessment and Plan:  Paula Manning is a 73 year old spanish speaking only female with PMHx of HepC cirrhosis s/p treatment with achievement of SVR, DM, HTN who presents to the ED for shortness of breath x 5 days.     #. Sob 2/2 right pleural effusion:  likely due to dietary indiscretion   Cont Diuresis with 40 IV lasix bid, aldactone 100 -> change to lasix 60 iv, aldactone 150 mg daily  S/p thoracentesis in IR 11/13 with 1.3L removed  Strict I/O  Low Na diet   Nutrition consulted for education on low salt diet, appreciate recs   TTE ordered     #. Hep C Cirrhosis: s/p treatment of Hep C with SVR achieved   Ascites-diuretics per above   HE-mental status clear, cont rifaxamin  EV-sm distal EV 5/13: propanolol dc'd at last admission 2/2 bradycardia   Continue cipro for SBP ppx   HCC: imaging done in May     #. DM: on lantus and glipizide as outpatient.   Cont lantus, lispro qac, SSI. Diabetes team consulted, appreciate recs.    FEN: low sodium diet   PPx: SCDs, Hold chemoppx given thrombocytopenia  FC/FC

## 2014-06-24 NOTE — Plan of Care (Signed)
Problem: Pain - Acute  Goal: Control of acute pain  Outcome: Met  Assess for pain. Pt denies pain at this time. Will monitor for new or unresolved pain.    Problem: Breathing Pattern - Ineffective  Goal: Respiratory rate, rhythm and depth return to baseline  Outcome: Met  Assess and monitor for ineffective breathing pattern. Respirations even and unlabored. Dyspnea noted upon exertion. Oxygen on at 2 liters nasal cannula and saturation maintained in the 90's. Lungs clear and diminished. Will continue to monitor for ineffective breathing pattern.    Problem: Mobility - Impaired  Goal: Able to ambulate within specified parameters  Outcome: Met  Assess for impaired mobility. Ambulates and transfers with stand by assist. Steady gait. Will monitor for impaired mobility.    Problem: Falls, Risk of  Goal: Keep patient free from falls utilizing universal fall precautions  Outcome: Met  Assess for risk of falls. Bed brakes locked with bed in low position with side rails up times two. Call bell and belongings within reach. Nonskid footwear in place. Daughter at bedside. Advised to use call bell for assist. No falls this shift. Will monitor for risk of falls.    Problem: Discharge Planning  Goal: Participation in care planning  Outcome: Met  Assess for discharge planning. No orders for discharge at this time. All disciplines working on discharge orders. Will update pt and family on plan of care. Will monitor for discharge orders.

## 2014-06-24 NOTE — Plan of Care (Signed)
Pt sitting up in chair with no distress. Daughter at bedside. Denies pain. Dressing to right upper back c/d/i.

## 2014-06-24 NOTE — Progress Notes (Addendum)
ATTENDING PROGRESS NOTE ATTESTATION  Interval history:  S/P 1.3L thora 11/13  Lasix 40 IV BID yesterday  See fellow's history and physical for further details of the patient's history.    Objective  I have examined the patient and concur with the fellow's exam.  Labs were reviewed.  Still on 2L NC  thora protein 1, LDH 61  Cr 1.03, BUN 22    Assessment and Plan  I agree with the fellow's care plan.    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 Admitted with SOB and found to have a large right pleural effusion in the setting of increased sodium intake.   -- lasix 60/spiro 150 today  -- thoracentesis 11/13.  -- daily weights downtrending. In/outs not well recorded  -- supplements TID with meals  -- echo Monday. If not improving will consider proceeding with full TIPS eval    See the fellow's history and physical for further details.  Paula Kays MD, PID [id number]

## 2014-06-24 NOTE — Plan of Care (Signed)
Problem: Pain - Acute  Goal: Control of acute pain  Outcome: Met  Pt denies pain, resting and sleeping well. Will continue to monitor.     Problem: Breathing Pattern - Ineffective  Goal: Respiratory rate, rhythm and depth return to baseline  Outcome: Met  Pt has diminished BS to bases, has 2L NC with O2 sat 94-97%. Pt has no SOB at this time, + non productive cough at times. Will continue to monitor.     Problem: Mobility - Impaired  Goal: Able to ambulate within specified parameters  Outcome: Met  Pt able to ambulate with steady gait, needs x1 stand by assist. Pt family cooperative and assist pt when getting up out of bed.     Problem: Falls, Risk of  Goal: Keep patient free from falls utilizing universal fall precautions  Outcome: Met  Pt ambulating well with stand by assist. Will continue to monitor.     Problem: Discharge Planning  Goal: Participation in care planning  Outcome: Met  Pt AAOx4 spanish speaking. Discussed needs, pt states no needs at this time. Instructed to report any pain, discomfort or concerns. Family at bedside, pt resting will continue to monitor.     Problem: Fluid Volume Excess  Goal: Absence of edema; peripheral and/or pulmonary  Outcome: Met  Pt has trace edema, pt has scheduled lasix and on daily weights. Pt on 1.5L fluid restriction.     Problem: Skin Integrity- Intact patient high risk for impaired skin integrity Braden scale less than or equal to 18  Goal: Skin remains intact  Outcome: Met  Pt skin has some ecchymotic areas and some discoloration, s/p Thoracentesis dressing CDI.     Problem: Alteration in Blood Glucose  Goal: Glucose level within specified parameters  Outcome: Not Met  HS FS 198 no HS coverage ordered. Will continue to monitor.

## 2014-06-25 LAB — COMPREHENSIVE METABOLIC PANEL, BLOOD
ALT (SGPT): 28 U/L (ref 0–33)
AST (SGOT): 29 U/L (ref 0–32)
Albumin: 2.9 g/dL — ABNORMAL LOW (ref 3.5–5.2)
Alkaline Phos: 135 U/L (ref 35–140)
Anion Gap: 13 mmol/L (ref 7–15)
BUN: 25 mg/dL — ABNORMAL HIGH (ref 6–20)
Bicarbonate: 25 mmol/L (ref 22–29)
Bilirubin, Tot: 1.86 mg/dL — ABNORMAL HIGH (ref <1.20)
Calcium: 8.6 mg/dL (ref 8.5–10.6)
Chloride: 93 mmol/L — ABNORMAL LOW (ref 98–107)
Creatinine: 0.88 mg/dL (ref 0.51–0.95)
GFR: >60 mL/min
Glucose: 164 mg/dL — ABNORMAL HIGH (ref 70–115)
Potassium: 3.8 mmol/L (ref 3.5–5.1)
Sodium: 131 mmol/L — ABNORMAL LOW (ref 136–145)
Total Protein: 6.9 g/dL (ref 6.0–8.0)

## 2014-06-25 LAB — CBC WITH DIFF, BLOOD
ANC-Automated: 3.3 10*3/uL (ref 1.6–7.0)
Abs Eosinophils: 0.1 10*3/uL (ref 0.1–0.7)
Abs Lymphs: 0.6 10*3/uL — ABNORMAL LOW (ref 0.8–3.1)
Abs Monos: 0.6 10*3/uL (ref 0.2–0.8)
Eosinophils: 2 % (ref 1–4)
Hct: 32.7 % — ABNORMAL LOW (ref 34.0–45.0)
Hgb: 11.7 gm/dL (ref 11.2–15.7)
Lymphocytes: 14 % — ABNORMAL LOW (ref 19–53)
MCH: 32.3 pg — ABNORMAL HIGH (ref 26.0–32.0)
MCHC: 35.8 % (ref 32.0–36.0)
MCV: 90.3 um3 (ref 79.0–95.0)
MPV: 11.1 fL (ref 9.4–12.4)
Monocytes: 12 % (ref 5–12)
Plt Count: 47 10*3/uL — ABNORMAL LOW (ref 140–370)
RBC: 3.62 10*6/uL — ABNORMAL LOW (ref 3.90–5.20)
RDW: 14.9 % — ABNORMAL HIGH (ref 12.0–14.0)
Segs: 72 % — ABNORMAL HIGH (ref 34–71)
WBC: 4.6 10*3/uL (ref 4.0–10.0)

## 2014-06-25 LAB — GLUCOSE (POCT)
Glucose (POCT): 147 mg/dL — ABNORMAL HIGH (ref 70–115)
Glucose (POCT): 273 mg/dL — ABNORMAL HIGH (ref 70–115)
Glucose (POCT): 123 mg/dL — ABNORMAL HIGH (ref 70–115)
Glucose (POCT): 84 mg/dL (ref 70–115)

## 2014-06-25 LAB — STERILE FLUID CULTURE W/GRAM STAIN, AEROBIC
Fluid Culture Result: NO GROWTH
Gram Stain Result: NONE SEEN

## 2014-06-25 LAB — PROTHROMBIN TIME, BLOOD
INR: 1.6
PT,Patient: 17.4 s — ABNORMAL HIGH (ref 9.7–12.5)

## 2014-06-25 MED ORDER — INSULIN LISPRO (HUMAN) 100 UNIT/ML SC SOLN (CUSTOM)
1.0000 [IU] | Freq: Four times a day (QID) | INTRAMUSCULAR | Status: DC
Start: 2014-06-25 — End: 2014-07-04
  Administered 2014-06-25: 12:00:00 8 [IU] via SUBCUTANEOUS
  Administered 2014-06-26: 21:00:00 4 [IU] via SUBCUTANEOUS
  Administered 2014-06-26: 13:00:00 8 [IU] via SUBCUTANEOUS
  Administered 2014-06-27: 13:00:00 4 [IU] via SUBCUTANEOUS
  Administered 2014-06-28: 12:00:00 3 [IU] via SUBCUTANEOUS
  Administered 2014-06-29: 21:00:00 5 [IU] via SUBCUTANEOUS
  Administered 2014-06-29: 09:00:00 3 [IU] via SUBCUTANEOUS
  Administered 2014-06-29: 13:00:00 8 [IU] via SUBCUTANEOUS
  Administered 2014-06-29 – 2014-06-30 (×3): 10 [IU] via SUBCUTANEOUS
  Administered 2014-06-30: 09:00:00 5 [IU] via SUBCUTANEOUS
  Administered 2014-06-30: 21:00:00 7 [IU] via SUBCUTANEOUS
  Administered 2014-07-01: 13:00:00 10 [IU] via SUBCUTANEOUS
  Administered 2014-07-01: 19:00:00 4 [IU] via SUBCUTANEOUS
  Administered 2014-07-01: 09:00:00 5 [IU] via SUBCUTANEOUS
  Administered 2014-07-02: 21:00:00 4 [IU] via SUBCUTANEOUS
  Administered 2014-07-02: 09:00:00 3 [IU] via SUBCUTANEOUS
  Administered 2014-07-02 (×2): 5 [IU] via SUBCUTANEOUS
  Administered 2014-07-03: 13:00:00 6 [IU] via SUBCUTANEOUS
  Administered 2014-07-03: 19:00:00 5 [IU] via SUBCUTANEOUS
  Administered 2014-07-04: 14:00:00 3 [IU] via SUBCUTANEOUS
  Filled 2014-06-25: qty 6
  Filled 2014-06-25: qty 5
  Filled 2014-06-25: qty 4
  Filled 2014-06-25: qty 3
  Filled 2014-06-25: qty 4
  Filled 2014-06-25: qty 10
  Filled 2014-06-25: qty 2
  Filled 2014-06-25: qty 3
  Filled 2014-06-25: qty 2
  Filled 2014-06-25 (×2): qty 3
  Filled 2014-06-25: qty 8
  Filled 2014-06-25: qty 10
  Filled 2014-06-25: qty 2
  Filled 2014-06-25: qty 8
  Filled 2014-06-25: qty 6
  Filled 2014-06-25: qty 2
  Filled 2014-06-25: qty 5
  Filled 2014-06-25: qty 10
  Filled 2014-06-25 (×2): qty 4
  Filled 2014-06-25: qty 10
  Filled 2014-06-25: qty 5

## 2014-06-25 MED ORDER — INSULIN LISPRO (HUMAN) 100 UNIT/ML SC SOLN (CUSTOM)
10.0000 [IU] | Freq: Three times a day (TID) | INTRAMUSCULAR | Status: DC
Start: 2014-06-25 — End: 2014-06-30
  Administered 2014-06-25 (×2): 10 [IU] via SUBCUTANEOUS
  Administered 2014-06-26: 09:00:00 5 [IU] via SUBCUTANEOUS
  Administered 2014-06-26: 13:00:00 7 [IU] via SUBCUTANEOUS
  Administered 2014-06-26: 19:00:00 5 [IU] via SUBCUTANEOUS
  Administered 2014-06-27 (×2): 10 [IU] via SUBCUTANEOUS
  Filled 2014-06-25 (×9): qty 10

## 2014-06-25 MED ORDER — INSULIN LISPRO (HUMAN) 100 UNIT/ML SC SOLN (CUSTOM)
6.0000 [IU] | Freq: Three times a day (TID) | INTRAMUSCULAR | Status: DC
Start: 2014-06-25 — End: 2014-06-30
  Administered 2014-06-25 – 2014-06-29 (×8): 6 [IU] via SUBCUTANEOUS
  Administered 2014-06-30: 11:00:00 3 [IU] via SUBCUTANEOUS
  Filled 2014-06-25 (×10): qty 6

## 2014-06-25 NOTE — Interdisciplinary (Addendum)
Clinical Nutr Kcal Count Results:   Kcal Count note for day 1. Data from 06/24/14.  EST Needs: [place] je[place] (55kg) 1003 x 1.2-1.5  1204-1504kcals (22-27kcals/kg) 72-88g Pro/D (1.3-1.6g Pro/kg) Fluids per MD  PO Diet Rx: Carb Ltd STd, 2gma Na + Glucerna TID   PO intake: 1305 kcal 72 grams Protein (3 meals recorded + 100% of 2 Glucerna supplements)   B: 539 kcal 20 grams L:  130 kcal 10 grams D: 196 kcal 12 grams Snacks: No Snacks recorded Shakes/Supplements: 440 kcal 20 grams   108% of low end energy needs met.   100% of low end protein needs met.   Will continue collecting kcal count data and RD to assess/summarize per policy.  Ananias Pilgrim, BS, NDTR

## 2014-06-25 NOTE — Progress Notes (Signed)
INPATIENT HEPATOLOGY/GASTROENTEROLOGY PROGRESS NOTE   Date:  06/25/2014   Author: Edrick Kins, MD    Current Hospital Stay:   5 days - Admitted on: 06/20/2014    Overnight Events/Subjective:    - endorses inc SOB today, feels worse    Medications:   calcium carbonate  600 mg BID    ciprofloxacin  500 mg Q24H    famotidine  20 mg Daily    furosemide  60 mg Daily    insulin glargine  26 Units Daily before lunch    insulin lispro  1-10 Units 4x Daily WC    insulin lispro  10 Units TID WC    insulin lispro  6 Units TID WC    multivitamin with minerals  1 tablet Daily    rifaximin  550 mg Q12H    sodium chloride (PF)  3 mL Q8H    spironolactone  150 mg Daily       Objective:    Vital Signs:  Temperature:  [97.3 F (36.3 C)-98.4 F (36.9 C)] 98.4 F (36.9 C) (11/15 1421)  Blood pressure (BP): (113-117)/(47-60) 117/58 mmHg (11/15 1421)  Heart Rate:  [87-96] 92 (11/15 1421)  Respirations:  [16-18] 16 (11/15 1421)  Pain Score:  [-] 0 (11/15 1421)  O2 Device:  [-] Nasal cannula (11/15 0829)  O2 Flow Rate (L/min):  [2 l/min] 2 l/min (11/15 0829)  SpO2:  [93 %-98 %] 98 % (11/15 1421)    Intake/Output (Current Shift):  11/14 0600 - 11/15 0559  In: 253 [P.O.:894; I.V.:3]  Out: 1125 [Urine:1125]    Physical Exam:  General: NAD  HEENT: No scleral icterus,NC in place, trachea midline, mucus membranes moist.  Lungs: Decreased BS on R side up to mid/upper lungs  Cardiovascular: Irregular  Abdomen: Distended with mild fluid wave  Extremities: Edema present bilaterally  Skin: Non-icteric and no visible rash  Neuro: No asterixis    Laboratory data:   Recent Labs      06/23/14   0601  06/24/14   0531  06/25/14   0538   NA  132*  136  131*   K  3.9  3.9  3.8   CL  97*  97*  93*   BICARB  _0 BUN  22*  22*  25*   CREAT  0.88  1.03*  0.88   [place]  8.5  8.9  8.6   TP  6.7  7.2  6.9   ALB  2.7*  3.0*  2.9*   TBILI  1.80*  2.15*  1.86*   AST  _1 ALT  _2 ALK  149*  138  135       Recent Labs    06/23/14   0600  06/24/14   0531  06/25/14   0538   WBC  4.3  3.8*  4.6   HGB  12.1  11.5  11.7   HCT  33.5*  32.5*  32.7*   MCV  91.0  91.8  90.3   PLT  43*  45*  47*   SEG  75*  71  72*   LYMPHS  13*  15*  14*   MONOS  _3 EOS  _4 Recent Labs      06/23/14   0601  06/24/14   0531  06/25/14   0538   INR  1.7  1.6  1.6       Imaging/Studies:  ABD U/S  IMPRESSION:  1. Large right pleural effusion.    2. Cirrhotic liver with sequela of portal hypertension including ascites and   splenomegaly.    3. 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 The   right and left portal veins remain patent with flow in the proper direction.    4. 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     #R pleural effusion: Repeat thoracentesis c/w hydrothorax. No signs of infection or new onset CHF at this time. Seems to be recurreing despite diuresis  -- cont diuretic lasix 60IV daily, aldactone to 150 daily  -- strict IOs  -- daily wts  -- low Na diet  -- TTE Monday, if not reponding to diuretics, may need to consider TIPS in future  -- albumin with diuretics if < 3    #HCV GT2 cirrhosis with SVR c/b EV/ascites/hydrothorax: MELD 14. 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 Not currently taking BB per patient report  *PVT: Appears stable without immediate plans to anticoagulate.   *Ascites: No SBP on dx para 11/10.  Cont cipro ppx. On IV lasix + spironolactone. Educate again on low salt diet  *HE: On rifaximin, unclear if previously intolerant of lactulose  *HCC: Imaging from 06/2014 without [place]. Serial imaging for IPMN    DWA [place]    Edrick Kins, MD  [facility]  [phone]

## 2014-06-25 NOTE — Progress Notes (Signed)
ATTENDING PROGRESS NOTE ATTESTATION  Interval history:  Feels "the same"  See fellow's history and physical for further details of the patient's history.    Objective  I have examined the patient and concur with the fellow's exam.  Labs were reviewed.  Still on 2L NC  Stable Cr  Fluid to RUL    Assessment and Plan  I agree with the fellow's care plan.    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 Admitted with SOB and found to have a large right pleural effusion in the setting of increased sodium intake.   -- lasix 60/spiro 150  -- thoracentesis 11/13. May need to repeat tomorrow  -- supplements TID with meals. Met 100% goals yesterday  -- echo Monday. [place] with known non occlusive MPV thrombus    See the fellow's history and physical for further details.  [name] MD, PID [id number]

## 2014-06-25 NOTE — Progress Notes (Signed)
Endocrine/Diabetes Progress Note    Overnight: no acute events,     Subjective:  Denies n/v, eating 50-100% meals and supplement at breakfast and supplement during day, eating slowly    Objective:  Temperature:  [97.3 F (36.3 C)-98.7 F (37.1 C)] 97.3 F (36.3 C) (11/15 0829)  Blood pressure (BP): (109-116)/(47-60) 116/60 mmHg (11/15 0902)  Heart Rate:  [87-105] 90 (11/15 0902)  Respirations:  [16-20] 16 (11/15 0829)  Pain Score:  [-] 0 (11/15 0829)  O2 Device:  [-] Nasal cannula (11/15 0829)  O2 Flow Rate (L/min):  [2 l/min] 2 l/min (11/15 0829)  SpO2:  [93 %-97 %] 96 % (11/15 0829)  Body mass index is 22.46 kg/(m^2).      Diet: CHO Limited + Glucerna tid    Blood Sugars reviewed:      AM Lu Di HS O/N      11/11  269 399 253 181  lantus  15 5  Lispro  +3 5+4 8+3 -    11/12  188 278 199 265  Lantus  25  Lispro  4+1 8+3 4+1 2    11.13.15 185 315 209 198  Lantus  30  Lispro  10+2 6 5+5    11.14.15 122 251 234 271  Lantus   26  Lispro  8+4 4+5 4+3    A/P:  Paula Manning is a 73 year old female w/ uncontrolled DM2 c/b neuropathy as well as HepC cirrhosis, HTN admitted 06/20/2014 w/ 5 days SOB and found to have R pleural effusion thought 2/2 dietary indiscretion.     Glucose trend remains elevated, fasting glucose better today, insulin orders now addressing meal tray and supplement, recommend the following changes:    Inpatient Recs: paged to first call   - Continue lantus 26 units daily before lunch  - Increase lispro 8--->10 units ac tid, RN to give 0, 1/2, whole dose based on % tray consumed  - Increase Lispro 4 -> 6 units tid AC, "FOR Glucerna COVERAGE, do not give if pt not drinking supplement."  - Increase Moderate -> High correction scale w lispro ac/hs    Outpatient Recs/Education:    1. Self-monitor blood glucose: has meter covered per insurance and strips    2. Healthy Eating: reviewed cho groups with family and pt, nurse to show Emmi videos    3. Activity:per MD    4. Hypoglycemia:will cover  tomorrow    5. Medication: likely modify dosages, but same, oral and insulin.    6. Discharge regimen:TBD, likely Lantus + orals    7. Follow-up: f/u w/ PCP Dr Rayburn Felt.    Will cont to follow

## 2014-06-25 NOTE — Plan of Care (Signed)
Problem: Pain - Acute  Goal: Control of acute pain  Outcome: Met  Pt denies any pain.Marland Kitchen Appears comfortable.    Problem: Mobility - Impaired  Goal: Able to ambulate within specified parameters  Outcome: Met  Moves all extremities. Assisted to the bathroom and back. Out of bed to chair with  meals.    Problem: Falls, Risk of  Goal: Keep patient free from falls utilizing universal fall precautions  Outcome: Met  Hourly rounding done. Call light placed within reach. Compliant in calling for assist. Assisted to the bathroom and back to bed/chair.bedside commode provided but pt prefers going to the bathroom. No falls.    Problem: Alteration in Blood Glucose  Goal: Glucose level within specified parameters  Outcome: Not Met  Pt's fingerstick before breakfast and dinner were WNL. Finished a can of glucerna for breakfast and lunch. Insulin coverage given.pt on calorie count.

## 2014-06-25 NOTE — Progress Notes (Signed)
Daily Progress Note:  06/21/2014     Current Hospital Stay:   5 days - Admitted on: 06/20/2014    Subjective:  No acute events overnight. Reports dyspnea is about same compared to yesterday.    Objective:    Vital Signs:  Temperature:  [97.9 F (36.6 C)-98.7 F (37.1 C)] 98.3 F (36.8 C) (11/15 0400)  Blood pressure (BP): (109-123)/(47-56) 113/52 mmHg (11/15 0400)  Heart Rate:  [87-105] 87 (11/15 0400)  Respirations:  [16-20] 16 (11/15 0400)  Pain Score:  [-] 0 (11/14 1600)  O2 Device:  [-] Nasal cannula (11/14 1600)  O2 Flow Rate (L/min):  [2 l/min] 2 l/min (11/14 1600)  SpO2:  [93 %-97 %] 97 % (11/15 0400)    Wt Readings from Last 1 Encounters:   06/25/14 53.9 kg (118 lb 13.3 oz)       Intake/Output (Current Shift):         Physical Exam:  GEN: elderly, thin female in NAD. Cooperative, responding appropriately.   HEENT: NCAT, neck supple, anicteric sclera, EOMI, OP clear   CV: rrr, no m/r/g   Pulmonary: decreased BS over entire R lung fields   Gastrointestinal: +NABS, soft but distended, nt   Neuro: no asterixis     Laboratory data:   Lab Results   Component Value Date    NA 131* 06/25/2014    K 3.8 06/25/2014    CL 93* 06/25/2014    BICARB 25 06/25/2014    BUN 25* 06/25/2014    CREAT 0.88 06/25/2014    GLU 164* 06/25/2014    [place] 8.6 06/25/2014     Lab Results   Component Value Date    WBC 4.6 06/25/2014    HGB 11.7 06/25/2014    HCT 32.7* 06/25/2014    PLT 47* 06/25/2014    SEG 72* 06/25/2014    LYMPHS 14* 06/25/2014    MONOS 12 06/25/2014    EOS 2 06/25/2014     Lab Results   Component Value Date    AST 29 06/25/2014    ALT 28 06/25/2014    ALK 135 06/25/2014    TBILI 1.86* 06/25/2014    TP 6.9 06/25/2014    ALB 2.9* 06/25/2014       Assessment and Plan:  Paula Manning is a 73 year old spanish speaking only female with PMHx of HepC cirrhosis s/p treatment with achievement of SVR, DM, HTN who presents to the ED for shortness of breath x 5 days.     #. Sob 2/2 right pleural effusion: likely due to dietary  indiscretion   Cont Diuresis with 40 IV lasix bid, aldactone 100 -> change to lasix 60 iv, aldactone 150 mg daily  S/p thoracentesis in IR 11/13 with 1.3L removed  Strict I/O  Low Na diet   Nutrition consulted for education on low salt diet, appreciate recs   TTE ordered - if no improvement with continued diuresis, may need TIPS eval      #. Hep C Cirrhosis: s/p treatment of Hep C with SVR achieved   Ascites-diuretics per above   HE-mental status clear, cont rifaxamin  EV-sm distal EV 5/13: propanolol dc'd at last admission 2/2 bradycardia   Continue cipro for SBP ppx   HCC: imaging done in May     #. DM: on lantus and glipizide as outpatient.   Cont lantus, lispro qac, SSI. Diabetes team consulted, appreciate recs.    FEN: low sodium diet, cont cal count  PPx: SCDs,  Hold chemoppx given thrombocytopenia  FC/FC

## 2014-06-25 NOTE — Plan of Care (Signed)
Problem: Pain - Acute  Goal: Control of acute pain  Outcome: Met  Pain assessed; denies any pain thus far. Provided rest and comfort.    Problem: Breathing Pattern - Ineffective  Goal: Respiratory rate, rhythm and depth return to baseline  Outcome: Met  Respiration even and unlabored. Kept on O2 @ 2 L/nc. In no distress. Noted shortness of breath on exertion.     Problem: Mobility - Impaired  Goal: Able to ambulate within specified parameters  Outcome: Met  Ambulates in the room with minimal assist. Daughter at bedside.    Problem: Falls, Risk of  Goal: Keep patient free from falls utilizing universal fall precautions  Outcome: Met  Fall precautions observed. Made room free from clutters. Be din low locked position with bed alarm on at all times. No fall/injury.    Problem: Discharge Planning  Goal: Participation in care planning  Outcome: Met  Continue on current plan of care. Verbalizes understanding. Will continue to monitor daily labs, wts and on diuretics. No d/c order at this time.    Problem: Fluid Volume Excess  Goal: Vital signs within specified parameters  Outcome: Met  VSS, no signs of edema. On fluid restriction.    Problem: Skin Integrity- Intact patient high risk for impaired skin integrity Braden scale less than or equal to 18  Goal: Skin remains intact  Outcome: Met  No new skin breakdown noted. Able to turn self in bed.    Problem: Alteration in Blood Glucose  Goal: Glucose level within specified parameters  Outcome: Met  Blood sugar checked; covered with insulin lispro as ordered.

## 2014-06-25 NOTE — Plan of Care (Signed)
Problem: Breathing Pattern - Ineffective  Goal: Respiratory rate, rhythm and depth return to baseline  Outcome: Met  Pt uses 2 liters oxygen. DOE noted. Sat 96%.lungs are diminished.No acute respiratory distress noted.    Problem: Fluid Volume Excess  Goal: Absence of edema; peripheral and/or pulmonary  Outcome: Not Met  Pt has trace generalized edema,distended abdomen. On lasix iv once a day. Also on fluid restriction 1.5 L/day.intake and output monitored.    Problem: Skin Integrity- Intact patient high risk for impaired skin integrity Braden scale less than or equal to 18  Goal: Skin remains intact  Outcome: Met  Pt has fragile skin.has mepilex to R fa intact.  Out of bed to chair with assist every meal.no incontinence noted.

## 2014-06-26 ENCOUNTER — Encounter (HOSPITAL_BASED_OUTPATIENT_CLINIC_OR_DEPARTMENT_OTHER): Payer: Self-pay

## 2014-06-26 ENCOUNTER — Encounter (HOSPITAL_COMMUNITY): Admission: EM | Disposition: A | Discharge status: Home Health | Payer: Self-pay | Attending: Internal Medicine

## 2014-06-26 DIAGNOSIS — I319 Disease of pericardium, unspecified: Secondary | ICD-10-CM

## 2014-06-26 DIAGNOSIS — I519 Heart disease, unspecified: Secondary | ICD-10-CM

## 2014-06-26 DIAGNOSIS — J9811 Atelectasis: Secondary | ICD-10-CM

## 2014-06-26 DIAGNOSIS — R0600 Dyspnea, unspecified: Secondary | ICD-10-CM

## 2014-06-26 DIAGNOSIS — J90 Pleural effusion, not elsewhere classified: Secondary | ICD-10-CM

## 2014-06-26 LAB — COMPREHENSIVE METABOLIC PANEL, BLOOD
ALT (SGPT): 28 U/L (ref 0–33)
AST (SGOT): 32 U/L (ref 0–32)
Albumin: 2.9 g/dL — ABNORMAL LOW (ref 3.5–5.2)
Alkaline Phos: 132 U/L (ref 35–140)
Anion Gap: 12 mmol/L (ref 7–15)
BUN: 24 mg/dL — ABNORMAL HIGH (ref 6–20)
Bicarbonate: 25 mmol/L (ref 22–29)
Bilirubin, Tot: 2.05 mg/dL — ABNORMAL HIGH (ref <1.20)
Calcium: 8.7 mg/dL (ref 8.5–10.6)
Chloride: 94 mmol/L — ABNORMAL LOW (ref 98–107)
Creatinine: 0.91 mg/dL (ref 0.51–0.95)
GFR: >60 mL/min
Glucose: 109 mg/dL (ref 70–115)
Potassium: 4.2 mmol/L (ref 3.5–5.1)
Sodium: 131 mmol/L — ABNORMAL LOW (ref 136–145)
Total Protein: 7.1 g/dL (ref 6.0–8.0)

## 2014-06-26 LAB — CBC WITH DIFF, BLOOD
ANC-Automated: 3.1 10*3/uL (ref 1.6–7.0)
Abs Eosinophils: 0.1 10*3/uL (ref 0.1–0.7)
Abs Lymphs: 0.6 10*3/uL — ABNORMAL LOW (ref 0.8–3.1)
Abs Monos: 0.4 10*3/uL (ref 0.2–0.8)
Eosinophils: 2 % (ref 1–4)
Hct: 32.1 % — ABNORMAL LOW (ref 34.0–45.0)
Hgb: 11.6 gm/dL (ref 11.2–15.7)
Lymphocytes: 14 % — ABNORMAL LOW (ref 19–53)
MCH: 32.8 pg — ABNORMAL HIGH (ref 26.0–32.0)
MCHC: 36.1 % — ABNORMAL HIGH (ref 32.0–36.0)
MCV: 90.7 um3 (ref 79.0–95.0)
MPV: 10.7 fL (ref 9.4–12.4)
Monocytes: 11 % (ref 5–12)
Plt Count: 45 10*3/uL — ABNORMAL LOW (ref 140–370)
RBC: 3.54 10*6/uL — ABNORMAL LOW (ref 3.90–5.20)
RDW: 15 % — ABNORMAL HIGH (ref 12.0–14.0)
Segs: 73 % — ABNORMAL HIGH (ref 34–71)
WBC: 4.2 10*3/uL (ref 4.0–10.0)

## 2014-06-26 LAB — ECHO WITH BUBBLE STUDY WITH IMAGE ENHANCEMENT AGENT IF NECESSARY
Cardac Output: 4.2 L/min (ref 4.0–8.0)
Cardiac Index: 2.8 L/min/m2 — ABNORMAL LOW (ref 2.8–4.2)
LA Volume Index: 16 mL/m2 (ref 16–28)
LV Ejection Fraction: 72 % (ref >50)
PA Pressure: 25 mm Hg + CVP (ref 20–30)

## 2014-06-26 LAB — GLUCOSE (POCT)
Glucose (POCT): 108 mg/dL (ref 70–115)
Glucose (POCT): 291 mg/dL — ABNORMAL HIGH (ref 70–115)
Glucose (POCT): 139 mg/dL — ABNORMAL HIGH (ref 70–115)
Glucose (POCT): 223 mg/dL — ABNORMAL HIGH (ref 70–115)

## 2014-06-26 LAB — PROTHROMBIN TIME, BLOOD
INR: 1.6
PT,Patient: 17.1 s — ABNORMAL HIGH (ref 9.7–12.5)

## 2014-06-26 LAB — VITAMIN D, 25-OH TOTAL
Vitamin D, 25-OH D2: <5 ng/mL
Vitamin D, 25-OH D3: 22 ng/mL
Vitamin D, 25-OH TOTAL: 22 ng/mL — ABNORMAL LOW (ref 30–80)

## 2014-06-26 LAB — PREPARE PLATELET PHERESIS: Platelet Pheresis, Irradiated: TRANSFUSED

## 2014-06-26 SURGERY — IR THORACENTESIS

## 2014-06-26 MED ORDER — FUROSEMIDE 10 MG/ML IJ SOLN
20.0000 mg | Freq: Once | INTRAMUSCULAR | Status: AC
Start: 2014-06-26 — End: 2014-06-26
  Administered 2014-06-26: 20 mg via INTRAVENOUS
  Filled 2014-06-26: qty 4

## 2014-06-26 MED ORDER — SPIRONOLACTONE 100 MG OR TABS
200.0000 mg | ORAL_TABLET | Freq: Every day | ORAL | Status: DC
Start: 2014-06-27 — End: 2014-06-29
  Administered 2014-06-27 – 2014-06-28 (×3): 200 mg via ORAL
  Filled 2014-06-26 (×2): qty 2

## 2014-06-26 MED ORDER — SPIRONOLACTONE 25 MG OR TABS
50.0000 mg | ORAL_TABLET | Freq: Once | ORAL | Status: AC
Start: 2014-06-26 — End: 2014-06-26
  Administered 2014-06-26: 50 mg via ORAL
  Filled 2014-06-26: qty 2

## 2014-06-26 MED ORDER — FUROSEMIDE 10 MG/ML IJ SOLN
80.0000 mg | Freq: Every day | INTRAMUSCULAR | Status: DC
Start: 2014-06-27 — End: 2014-06-29
  Administered 2014-06-27 – 2014-06-28 (×2): 80 mg via INTRAVENOUS
  Filled 2014-06-26 (×2): qty 8

## 2014-06-26 MED ORDER — SODIUM CHLORIDE 0.9 % IV SOLN
Freq: Once | INTRAVENOUS | Status: AC | PRN
Start: 2014-06-26 — End: 2014-06-27

## 2014-06-26 MED ORDER — LIDOCAINE, BUFFERED 1% IJ SOLN (COMPOUNDED)
INTRADERMAL | Status: DC | PRN
Start: 2014-06-26 — End: 2014-06-26
  Administered 2014-06-26: 5 mL via INTRADERMAL

## 2014-06-26 SURGICAL SUPPLY — 4 items
CATHETER CENTESIS 1-STEP 4FR 7CM (Lines/Drains) IMPLANT
COVER PROBE MICROTEK INTRAOPERATIVE 5" X 96" PC1308 (Drapes/towels) ×2
PREP CHLOROPREP 10.5ML 1-STEP (Misc Medical Supply) IMPLANT
TRAY THORACENTESIS (Kits/Sets/Trays) ×2 IMPLANT

## 2014-06-26 NOTE — Plan of Care (Signed)
Problem: Pain - Acute  Goal: Control of acute pain  Outcome: Met  Patient assessed for pain and reported no pain.     Problem: Breathing Pattern - Ineffective  Goal: Respiratory rate, rhythm and depth return to baseline  Outcome: Met  Patient with regular respirations,no complain of SOB, still with oxygen NC  And saturation >92%.    Problem: Mobility - Impaired  Goal: Able to ambulate within specified parameters  Outcome: Not Met  Progressing, patient is able to walked to bathroom and seat on chair with no complain of SOB.    Problem: Falls, Risk of  Goal: Keep patient free from falls utilizing universal fall precautions  Outcome: Met  Call light within reach, instructed to call for assistance at all times, family at bedside providing support and assistance.    Problem: Fluid Volume Excess  Goal: Vital signs within specified parameters  Outcome: Met  VS within parameters, fluid restrictions in place, monitoring intake and output, trace to no edema BLE.    Problem: Alteration in Blood Glucose  Goal: Glucose level within specified parameters  Outcome: Not Met  Blood sugar at night time was above parameters, patient was covered with sliding scale insulin see MAR.

## 2014-06-26 NOTE — Plan of Care (Signed)
Problem: Breathing Pattern - Ineffective  Goal: Respiratory rate, rhythm and depth return to baseline  Outcome: Not Met  Pt's respiratory status is even and regular, but pt is still noted with exertion.  Pt is on o2 at 2 liters nasal cannula.  Will monitor.    Problem: Discharge Planning  Goal: Participation in care planning  Outcome: Met  Pt was seen by primary team in room this morning.  Care plans was updated.  Pt is schedule for Echo with bubble today and possible for thoracentesis.  Will follow up.    Problem: Alteration in Blood Glucose  Goal: Glucose level within specified parameters  Outcome: Met  Pt's blood glucose is being check AC and HS.  Level before breakfast was 108.  Given pt Insulin as schedule and according to po intake.  Will monitor.

## 2014-06-26 NOTE — Progress Notes (Signed)
ATTENDING PROGRESS NOTE ATTESTATION  Interval history:  Feels worse. More sob.   See fellow's history and physical for further details of the patient's history.    Objective  I have examined the patient and concur with the fellow's exam.  Labs were reviewed.  Still on 2L NC  Stable Cr  Fluid to RUL    Assessment and Plan  I agree with the fellow's care plan.    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 Admitted with SOB and found to have a large right pleural effusion in the setting of increased sodium intake.   -- lasix 60/spiro 150 --> can increase to 80/200 today  -- thoracentesis 11/13. Repeat today since patient increasing discomfort  -- supplements TID with meals.   -- echo today. F/u results. [place] with known non occlusive MPV thrombus. Likely needs TIPS.     See the fellow's history and physical for further details.  [name] [place], PID [id number]

## 2014-06-26 NOTE — Interdisciplinary (Signed)
Clinical Nutr Kcal Count Results:   Kcal Count note for day 2. Data from 06/25/2014.   EST Needs: [place] je[place] (55kg) 1003 x 1.2-1.5  1204-1504kcals (22-27kcals/kg) 72-88g Pro/D (1.3-1.6g Pro/kg) Fluids per MD  PO Diet Rx: Carb Ltd+ 2g Na+ Glucerna TID  PO intake: 1055 kcal 67 grams Protein (2 meals recorded + 0% of 1 & 100% of 2 Glucerna Supplements supplements)    B:320 kcal 11 grams L:  295 kcal 36 grams D: No documented PO intake on kcal sheet Snacks/Supplements: 440 kcal 20 grams  88% of low end energy needs met.   93% of low end protein needs met.   Will continue collecting kcal count data and RD to assess/summarize per policy.  Horris Latino, BS, NDTR

## 2014-06-26 NOTE — Plan of Care (Signed)
Problem: Breathing Pattern - Ineffective  Goal: Respiratory rate, rhythm and depth return to baseline  Outcome: Met  Pt had Thoracentesis done today by IR. 1.4 liters removed from patient per report.  Vital signs obtained.  Sat level was noted at 96% on 4 liters.  Pt stated she is breathing OK.

## 2014-06-26 NOTE — Plan of Care (Signed)
Problem: Pain - Acute  Goal: Control of acute pain  Outcome: Met  Resting comfortably in bed. Denies any pain.    Problem: Breathing Pattern - Ineffective  Goal: Respiratory rate, rhythm and depth return to baseline  Outcome: Met  Respiration even and unlabored. On O2 @ 2L/nc. In no distress. Complains of mild SOB with exertion.     Problem: Falls, Risk of  Goal: Keep patient free from falls utilizing universal fall precautions  Outcome: Met  Pt alert and able to verbalizes needs. Daughter at bedside and very supportive. Bed in low locked position. Call button in reach at all times.    Problem: Alteration in Blood Glucose  Goal: Glucose level within specified parameters  Outcome: Met  No signs of any glycemic reaction.

## 2014-06-26 NOTE — Progress Notes (Signed)
Daily Progress Note:  06/21/2014     Current Hospital Stay:   6 days - Admitted on: 06/20/2014    Subjective:  Continues to c/o dyspnea.  No improvement in symptoms over the past few days.    Objective:    Vital Signs:  Temperature:  [97.7 F (36.5 C)-98.8 F (37.1 C)] 97.7 F (36.5 C) (11/16 0750)  Blood pressure (BP): (115-122)/(47-60) 122/55 mmHg (11/16 0750)  Heart Rate:  [88-96] 94 (11/16 0750)  Respirations:  [16-18] 18 (11/16 0750)  Pain Score:  [-] 0 (11/16 0750)  O2 Device:  [-] Nasal cannula (11/16 0750)  SpO2:  [95 %-98 %] 96 % (11/16 0750)    Wt Readings from Last 1 Encounters:   06/26/14 54.023 kg (119 lb 1.6 oz)       Intake/Output (Current Shift):         Physical Exam:  GEN: elderly, thin female in NAD. Cooperative, responding appropriately.   HEENT: NCAT, neck supple, anicteric sclera, EOMI, OP clear   CV: rrr, no m/r/g   Pulmonary: decreased BS over entire R lung field   Gastrointestinal: +NABS, soft but distended, nt   Neuro: no asterixis     Laboratory data:   Lab Results   Component Value Date    NA 131* 06/26/2014    K 4.2 06/26/2014    CL 94* 06/26/2014    BICARB 25 06/26/2014    BUN 24* 06/26/2014    CREAT 0.91 06/26/2014    GLU 109 06/26/2014    [place] 8.7 06/26/2014     Lab Results   Component Value Date    WBC 4.2 06/26/2014    HGB 11.6 06/26/2014    HCT 32.1* 06/26/2014    PLT 45* 06/26/2014    SEG 73* 06/26/2014    LYMPHS 14* 06/26/2014    MONOS 11 06/26/2014    EOS 2 06/26/2014     Lab Results   Component Value Date    AST 32 06/26/2014    ALT 28 06/26/2014    ALK 132 06/26/2014    TBILI 2.05* 06/26/2014    TP 7.1 06/26/2014    ALB 2.9* 06/26/2014       Assessment and Plan:  Paula Manning is a 73 year old spanish speaking only female with PMHx of HepC cirrhosis s/p treatment with achievement of SVR, DM, HTN who presents to the ED for shortness of breath x 5 days.     #. Sob 2/2 right pleural effusion: likely due to dietary indiscretion but no improvement with diuresis over past 4  days  Cont Diuresis with 40 IV lasix bid, aldactone 100 -> change to lasix 80 iv, aldactone 200 mg daily  S/p thoracentesis in IR 11/13 with 1.3L removed. IR consulted for repeat thoracentesis today  Strict I/O  Low Na diet   Nutrition consulted for education on low salt diet, appreciate recs   TTE ordered, then likely consult to IR for TIPS eval      #. Hep C Cirrhosis: s/p treatment of Hep C with SVR achieved   Ascites-diuretics per above   HE-mental status clear, cont rifaximin  EV-sm distal EV 5/13: propanolol dc'd at last admission 2/2 bradycardia   Continue cipro for SBP ppx   HCC: imaging done in May     #. DM: on lantus and glipizide as outpatient. BG poorly controlled on admission.  Cont lantus, lispro qac, SSI. Diabetes team consulted, appreciate recs.    FEN: low sodium diet, cont  cal count  PPx: SCDs, Hold chemoppx given thrombocytopenia  FC/FC

## 2014-06-26 NOTE — Progress Notes (Signed)
Endocrine/Diabetes Progress Note    Overnight: no acute events,     Subjective:  Stomach uncomfortable today, slight n/v, eating very little of meals and supplement at breakfast     Objective:  Temperature:  [97 F (36.1 C)-98.5 F (36.9 C)] 97 F (36.1 C) (11/16 2051)  Blood pressure (BP): (118-148)/(30-56) 127/52 mmHg (11/16 2051)  Heart Rate:  [80-94] 87 (11/16 2051)  Respirations:  [16-20] 19 (11/16 2051)  Pain Score:  [-] 0 (11/16 2051)  O2 Device:  [-] Nasal cannula (11/16 2051)  O2 Flow Rate (L/min):  [3 l/min-4 l/min] 3 l/min (11/16 2051)  SpO2:  [90 %-99 %] 96 % (11/16 2051)  Body mass index is 22.52 kg/(m^2).    GEN: NAD  PULM: breathing comfortably    Diet: CHO Limited + Glucerna tid    Blood Sugars reviewed:      AM Lu Di HS O/N      11.15.15 147 273 123 84  Lantus   26  Lispro  8+4 10+8+6  10     11.16.15 108  Lantus     Lispro  5+6     A/P:  Paula Manning is a 73 year old female w/ uncontrolled DM2 c/b neuropathy as well as HepC cirrhosis, HTN admitted 06/20/2014 w/ 5 days SOB and found to have R pleural effusion thought 2/2 dietary indiscretion.     BG trend improved, no changes in regimen at this time.      Inpatient Recs: paged to first call   - Continue lantus 26 units daily before lunch  - Cont 10 units ac tid, RN to give 0, 1/2, whole dose based on % tray consumed  - Cont Lispro 6 units tid AC, "FOR Glucerna COVERAGE, do not give if pt not drinking supplement."  - Cont High correction scale w lispro ac/hs    Outpatient Recs/Education:    1. Self-monitor blood glucose: has meter covered per insurance and strips    2. Healthy Eating: reviewed cho groups with family and pt, nurse to show Emmi videos    3. Activity:per MD    4. Hypoglycemia: need to review    5. Medication: likely modify dosages, but same, oral and insulin.    6. Discharge regimen:TBD, likely Lantus + orals    7. Follow-up: f/u w/ PCP Dr. Rayburn Felt.    Will cont to follow

## 2014-06-26 NOTE — RN OR/Procedure Note (Signed)
Post thoracentesis sats down to 90-91%. Evelena Peat NP and Dr. Verlin Grills aware. Increased O2 to 6L NC. Pt reports feeling SOB. Breath sounds auscultated t/o.

## 2014-06-26 NOTE — Progress Notes (Signed)
INPATIENT HEPATOLOGY/GASTROENTEROLOGY PROGRESS NOTE   Date:  06/26/2014   Author: [facility] Stay:   6 days - Admitted on: 06/20/2014    Overnight Events/Subjective:    - ongoing SOB despite thora on Friday  - cont with diuresis    Medications:   calcium carbonate  600 mg BID    ciprofloxacin  500 mg Q24H    famotidine  20 mg Daily    [START ON 06/27/2014] furosemide  80 mg Daily    insulin glargine  26 Units Daily before lunch    insulin lispro  1-10 Units 4x Daily WC    insulin lispro  10 Units TID WC    insulin lispro  6 Units TID WC    multivitamin with minerals  1 tablet Daily    rifaximin  550 mg Q12H    sodium chloride (PF)  3 mL Q8H    [START ON 06/27/2014] spironolactone  200 mg Daily       Objective:    Vital Signs:  Temperature:  [97.7 F (36.5 C)-98.8 F (37.1 C)] 97.7 F (36.5 C) (11/16 0750)  Blood pressure (BP): (115-122)/(47-55) 122/55 mmHg (11/16 0750)  Heart Rate:  [88-96] 94 (11/16 0750)  Respirations:  [16-18] 18 (11/16 0750)  Pain Score:  [-] 0 (11/16 0750)  O2 Device:  [-] Nasal cannula (11/16 0750)  SpO2:  [95 %-96 %] 96 % (11/16 0750)    Intake/Output (Current Shift):  11/15 0600 - 11/16 0559  In: 837 [P.O.:834; I.V.:3]  Out: 1100 [Urine:1100]    Physical Exam:  General: NAD  HEENT: No scleral icterus,NC in place, trachea midline, mucus membranes moist.  Lungs: Decreased BS on R side up to mid/upper lungs  Cardiovascular: Irregular  Abdomen: Distended with mild fluid wave  Extremities: Edema present bilaterally  Skin: Non-icteric and no visible rash  Neuro: No asterixis    Laboratory data:   Recent Labs      06/24/14   0531  06/25/14   0538  06/26/14   0534   NA  136  131*  131*   K  3.9  3.8  4.2   CL  97*  93*  94*   BICARB  27  25  25    BUN  22*  25*  24*   CREAT  1.03*  0.88  0.91   [place]  8.9  8.6  8.7   TP  7.2  6.9  7.1   ALB  3.0*  2.9*  2.9*   TBILI  2.15*  1.86*  2.05*   AST  30  29  32   ALT  29  28  28    ALK  138  135  132       Recent Labs       06/24/14   0531  06/25/14   0538  06/26/14   0534   WBC  3.8*  4.6  4.2   HGB  11.5  11.7  11.6   HCT  32.5*  32.7*  32.1*   MCV  91.8  90.3  90.7   PLT  45*  47*  45*   SEG  71  72*  73*   LYMPHS  15*  14*  14*   MONOS  12  12  11    EOS  2  2  2        Recent Labs      06/24/14   0531  06/25/14  2993  06/26/14   0534   INR  1.6  1.6  1.6       Imaging/Studies:  ABD U/S  IMPRESSION:  1. Large right pleural effusion.    2. Cirrhotic liver with sequela of portal hypertension including ascites and   splenomegaly.    3. 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 The   right and left portal veins remain patent with flow in the proper direction.    4. 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     #R pleural effusion: Repeat thoracentesis c/w hydrothorax. No signs of infection or new onset CHF at this time. Seems to be recurreing despite diuresis  -- increasing diuretic lasix 80IV daily, aldactone to 200 daily  -- planning for repeat thoracentesis today given suspected reaccumulation  -- strict IOs  -- daily wts  -- low Na diet  -- TTE pending for workup of possible TIPS as not responding to diuretics.  Will need to notify VIR of partial nonocclusive PVT  -- albumin with diuretics if < 3    #HCV GT2 cirrhosis with SVR c/b EV/ascites/hydrothorax: MELD 14. 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 Not currently  taking BB per patient report  *PVT: Appears stable without immediate plans to anticoagulate.   *Ascites: No SBP on dx para 11/10. Cont cipro ppx. On IV lasix + spironolactone. Educate again on low salt diet  *HE: On rifaximin, unclear if previously intolerant of lactulose  *HCC: Imaging from 06/2014 without [place]. Serial imaging for IPMN    DWA Vodkin    Harle Stanford, MD  [facility] [place] [facility]

## 2014-06-26 NOTE — RN OR/Procedure Note (Signed)
To radiology for CXR. Sats 96% on 10L simple mask. Decreased to 4L simple mask and will monitor sats.

## 2014-06-26 NOTE — Plan of Care (Signed)
Problem: Pain - Acute  Goal: Control of acute pain  Outcome: Met  Pain level assessed via patient.  Per pt, denied having pain or discomfort.  Will monitor.    Problem: Mobility - Impaired  Goal: Able to ambulate within specified parameters  Outcome: Met  Pt is now able to ambulate in room and short distant in hallway with 1 person assistant.  Pt is also tolerated well with activity.    Problem: Fluid Volume Excess  Goal: Absence of edema; peripheral and/or pulmonary  Outcome: Not Met  Trace edema noted at thigh, but otherwise edema improved.  Lung sound is still diminish R>L.  Pt is on o2 at 2 liters n/c.  Lasix iv given as schedule.  Will monitor.    Problem: Skin Integrity- Intact patient high risk for impaired skin integrity Braden scale less than or equal to 18  Goal: Skin remains intact  Outcome: Met  Pt is noted with arms/legs ecchymosis, but general skin is intact.  No skin breakdown noted with peri area.  Pt can reposition and get out of bed with assistant.

## 2014-06-26 NOTE — RN OR/Procedure Note (Signed)
Sats remain 88%. Resp non-labored. Changed to 10L simple mask.

## 2014-06-27 DIAGNOSIS — I81 Portal vein thrombosis: Secondary | ICD-10-CM

## 2014-06-27 DIAGNOSIS — R161 Splenomegaly, not elsewhere classified: Secondary | ICD-10-CM

## 2014-06-27 LAB — COMPREHENSIVE METABOLIC PANEL, BLOOD
ALT (SGPT): 34 U/L — ABNORMAL HIGH (ref 0–33)
AST (SGOT): 40 U/L — ABNORMAL HIGH (ref 0–32)
Albumin: 3.2 g/dL — ABNORMAL LOW (ref 3.5–5.2)
Alkaline Phos: 143 U/L — ABNORMAL HIGH (ref 35–140)
Anion Gap: 12 mmol/L (ref 7–15)
BUN: 22 mg/dL — ABNORMAL HIGH (ref 6–20)
Bicarbonate: 27 mmol/L (ref 22–29)
Bilirubin, Tot: 2.12 mg/dL — ABNORMAL HIGH (ref <1.20)
Calcium: 8.9 mg/dL (ref 8.5–10.6)
Chloride: 91 mmol/L — ABNORMAL LOW (ref 98–107)
Creatinine: 0.82 mg/dL (ref 0.51–0.95)
GFR: >60 mL/min
Glucose: 136 mg/dL — ABNORMAL HIGH (ref 70–115)
Potassium: 4.2 mmol/L (ref 3.5–5.1)
Sodium: 130 mmol/L — ABNORMAL LOW (ref 136–145)
Total Protein: 7.6 g/dL (ref 6.0–8.0)

## 2014-06-27 LAB — GLUCOSE (POCT)
GLUCOSE (POCT): 129 mg/dL — ABNORMAL HIGH (ref 70–115)
GLUCOSE (POCT): 117 mg/dL — ABNORMAL HIGH (ref 70–115)
GLUCOSE (POCT): 127 mg/dL — ABNORMAL HIGH (ref 70–115)
Glucose (POCT): 195 mg/dL — ABNORMAL HIGH (ref 70–115)

## 2014-06-27 LAB — PROTHROMBIN TIME, BLOOD
INR: 1.6
PT,Patient: 17.3 s — ABNORMAL HIGH (ref 9.7–12.5)

## 2014-06-27 LAB — ZINC QUANTITATIVE, WHOLE BLOOD: Zinc Quantitative: 643.4 ug/dL (ref 440.0–860.0)

## 2014-06-27 LAB — CBC WITH DIFF, BLOOD
ANC-Automated: 3.5 10*3/uL (ref 1.6–7.0)
Abs Eosinophils: 0.1 10*3/uL (ref 0.1–0.7)
Abs Lymphs: 0.7 10*3/uL — ABNORMAL LOW (ref 0.8–3.1)
Abs Monos: 0.6 10*3/uL (ref 0.2–0.8)
Eosinophils: 2 % (ref 1–4)
Hct: 33.4 % — ABNORMAL LOW (ref 34.0–45.0)
Hgb: 12.2 gm/dL (ref 11.2–15.7)
Lymphocytes: 14 % — ABNORMAL LOW (ref 19–53)
MCH: 33 pg — ABNORMAL HIGH (ref 26.0–32.0)
MCHC: 36.5 % — ABNORMAL HIGH (ref 32.0–36.0)
MCV: 90.3 um3 (ref 79.0–95.0)
MPV: 10.5 fL (ref 9.4–12.4)
Monocytes: 13 % — ABNORMAL HIGH (ref 5–12)
Plt Count: 57 10*3/uL — ABNORMAL LOW (ref 140–370)
RBC: 3.7 10*6/uL — ABNORMAL LOW (ref 3.90–5.20)
RDW: 15 % — ABNORMAL HIGH (ref 12.0–14.0)
Segs: 71 % (ref 34–71)
WBC: 4.9 10*3/uL (ref 4.0–10.0)

## 2014-06-27 MED ORDER — SODIUM CHLORIDE 0.9 % IV BOLUS
250.0000 mL | INJECTION | Freq: Once | INTRAVENOUS | Status: AC
Start: 2014-06-27 — End: 2014-06-27
  Administered 2014-06-27 (×2): 250 mL via INTRAVENOUS

## 2014-06-27 MED ORDER — SODIUM CHLORIDE 0.9 % IV SOLN
10.0000 mg | Freq: Once | INTRAVENOUS | Status: AC
Start: 2014-06-27 — End: 2014-06-27
  Administered 2014-06-27: 10 mg via INTRAVENOUS
  Filled 2014-06-27: qty 1

## 2014-06-27 NOTE — Interdisciplinary (Signed)
Clinical Nutr Kcal Count Results:   Kcal Count note for day 3. Data from 06/26/2014.   EST Needs: [place] jeor (55kg) 1003 x 1.2-1.5  1204-1504kcals (22-27kcals/kg) 72-88g Pro/D (1.3-1.6g Pro/kg) Fluids per MD  PO Diet Rx: Carb Ltd+ 2g Na+ Glucerna TID  PO intake: 841 kcal 32 grams Protein (2 meals recorded +  100% of 2 Glucerna Supplements supplements)   B:216 kcal 8 grams L: 185 kcal 4 grams D: No documented PO intake on kcal sheet Snacks/Supplements: 440 kcal 20 grams  70% of low end energy needs met.   44% of low end protein needs met.   Will continue collecting kcal count data and RD to assess/summarize per policy  [facility]

## 2014-06-27 NOTE — Progress Notes (Signed)
Endocrine/Diabetes Progress Note    Overnight: no acute events     Subjective:  Feeling ok, appetite fair    Objective:  Temperature:  [97 F (36.1 C)-98.4 F (36.9 C)] 98.4 F (36.9 C) (11/17 1145)  Blood pressure (BP): (112-148)/(30-56) 126/54 mmHg (11/17 1145)  Heart Rate:  [80-94] 88 (11/17 1145)  Respirations:  [16-20] 20 (11/17 1145)  Pain Score:  [-] 0 (11/17 1145)  O2 Device:  [-] Nasal cannula (11/17 1059)  O2 Flow Rate (L/min):  [3 l/min-4 l/min] 3.5 l/min (11/17 1059)  SpO2:  [90 %-99 %] 98 % (11/17 1145)  Body mass index is 22.27 kg/(m^2).    GEN: NAD  PULM: breathing comfortably    Diet: CHO Limited + Glucerna tid    Blood Sugars reviewed:      AM Lu Di HS O/N      11.15.15 147 273 123 84  Lantus   26  Lispro  8+4 10+8+6  10     11.16.15 108 291 139 223  Lantus   26  Lispro  5+6 8+7 6+5 +4    11.17.15 129  Lantus  Lispro    A/P:  Paula Manning is a 73 year old female w/ uncontrolled DM2 c/b neuropathy as well as HepC cirrhosis, HTN admitted 06/20/2014 w/ 5 days SOB and found to have R pleural effusion thought 2/2 dietary indiscretion.     BG trend fair, occ highs related to timing of food, either late breakfast or snacks between     Inpatient Recs: paged to first call   - Continue lantus 26 units daily before lunch  - Cont 10 units ac tid, RN to give 0, 1/2, whole dose based on % tray consumed  - Cont Lispro 6 units tid AC, "FOR Glucerna COVERAGE, do not give if pt not drinking supplement."  - Cont High correction scale w lispro ac/hs    Outpatient Recs/Education:    1. Self-monitor blood glucose: has meter covered per insurance and strips    2. Healthy Eating: reviewed cho groups with family and pt, nurse to show Emmi videos    3. Activity:per MD    4. Hypoglycemia: need to review    5. Medication: likely modify dosages, but same, oral and insulin.    6. Discharge regimen:TBD, likely Lantus + orals    7. Follow-up: f/u w/ PCP Dr. Rayburn Felt.    Will cont to follow

## 2014-06-27 NOTE — Progress Notes (Signed)
INPATIENT HEPATOLOGY/GASTROENTEROLOGY PROGRESS NOTE   Date:  06/27/2014   Author: [place] [facility] Stay:   7 days - Admitted on: 06/20/2014    Overnight Events/Subjective:    --s/p thora 1.4L with small post-procedural PTX noted  --pt with initially increased O2 requirement and feeling worse from respiratory status, now back to 3L NC  --underwent TTE with positive late bubble    Medications:   calcium carbonate  600 mg BID    ciprofloxacin  500 mg Q24H    famotidine  20 mg Daily    furosemide  80 mg Daily    insulin glargine  26 Units Daily before lunch    insulin lispro  1-10 Units 4x Daily WC    insulin lispro  10 Units TID WC    insulin lispro  6 Units TID WC    multivitamin with minerals  1 tablet Daily    rifaximin  550 mg Q12H    sodium chloride (PF)  3 mL Q8H    spironolactone  200 mg Daily       Objective:    Vital Signs:  Temperature:  [97 F (36.1 C)-98.1 F (36.7 C)] 97.3 F (36.3 C) (11/17 0736)  Blood pressure (BP): (112-148)/(30-56) 112/49 mmHg (11/17 0736)  Heart Rate:  [80-91] 86 (11/17 0736)  Respirations:  [16-20] 18 (11/17 0736)  Pain Score:  [-] 0 (11/17 0736)  O2 Device:  [-] Nasal cannula (11/17 0256)  O2 Flow Rate (L/min):  [3 l/min-4 l/min] 3 l/min (11/17 0256)  SpO2:  [90 %-99 %] 95 % (11/17 0736)    Intake/Output (Current Shift):  11/16 0600 - 11/17 0559  In: 1047 [P.O.:1047]  Out: 2675 [Urine:1275]    Physical Exam:  General: NAD  HEENT: No scleral icterus,NC in place, trachea midline, mucus membranes moist.  Lungs: Decreased BS on R side up to mid/upper lungs  Cardiovascular: Irregular  Abdomen: Distended with mild fluid wave  Extremities: Edema present bilaterally  Skin: Non-icteric and no visible rash  Neuro: No asterixis    Laboratory data:   Recent Labs      06/25/14   0538  06/26/14   0534  06/27/14   0635   NA  131*  131*  130*   K  3.8  4.2  4.2   CL  93*  94*  91*   BICARB  _0 BUN  25*  24*  22*   CREAT  0.88  0.91  0.82   [place]  8.6  8.7   8.9   TP  6.9  7.1  7.6   ALB  2.9*  2.9*  3.2*   TBILI  1.86*  2.05*  2.12*   AST  29  32  40*   ALT  28  28  34*   ALK  135  132  143*       Recent Labs      06/25/14   0538  06/26/14   0534  06/27/14   0635   WBC  4.6  4.2  4.9   HGB  11.7  11.6  12.2   HCT  32.7*  32.1*  33.4*   MCV  90.3  90.7  90.3   PLT  47*  45*  57*   SEG  72*  73*  71   LYMPHS  14*  14*  14*   MONOS  12  11  13*   EOS  _0 Recent Labs      06/25/14   0538  06/26/14   0534  06/27/14   0635   INR  1.6  1.6  1.6       Imaging/Studies:  ABD U/S  IMPRESSION:  1. Large right pleural effusion.    2. Cirrhotic liver with sequela of portal hypertension including ascites and   splenomegaly.    3. 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 The   right and left portal veins remain patent with flow in the proper direction.    4. 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    TTE 11/16  Conclusions:  1) LV is small and has normal systolic function.  2) Mild LV diastolic dysfunction suggesting normal or low left atrial  pressure.  3) Small pericardial effusion without evidence of tamponade.  4) Compared to previous study on 10/16/2011, no significant change.     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     #R pleural effusion: Repeat thoracentesis c/w hydrothorax. No signs of infection or new onset CHF at this time. Seems to be recurreing despite diuresis and salt restriction.  S/p thora on 11/13 and 11/16 now w/ small PTX  -- cont diuretic lasix 80IV daily, aldactone to 200 daily  -- daily CXR for small PTX, cont O2  and mont resp status closely. No indication for chest tube based on current appearance. VIR aware.   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 Will undergo repeat CT scan of abdomen to evaluate previous PVT  -- albumin with diuretics if < 3    #HCV GT2 cirrhosis with SVR c/b EV/ascites/hydrothorax: MELD 15. Relative stable disease.  TTE late positive bubble for shunt.     - low salt diet, diuresis with IV lasix + spironolactone, as noted above IV lasix increased today  - strict I/Os + daily wts    *EV: 05/2013 grade I EV. Not currently taking BB per patient report  *PVT: Appears stable without immediate plans to anticoagulate.   *Ascites: No SBP on dx para 11/10. Cont cipro ppx. On IV lasix + spironolactone. Low salt diet  *HE: On rifaximin, unclear if previously intolerant of lactulose  *HCC: Imaging from 06/2014 without HCC. Serial imaging for IPMN    DWA [place]    Harle Stanford, MD  [place] [facility]

## 2014-06-27 NOTE — Plan of Care (Signed)
Problem: Skin Integrity- Intact patient high risk for impaired skin integrity Braden scale less than or equal to 18  Goal: Skin remains intact  Outcome: Not Met  Patient able to reposition on bed frequently, gets out of bed and uses bedside commode. No skin breakdown.

## 2014-06-27 NOTE — Plan of Care (Signed)
Problem: Discharge Planning  Goal: Participation in care planning  Outcome: Met  Pt and family were seen by primary MD in room this morning.  No discharge plan is noted.  Pt is schedule for CT scan today and TIPS in AM.  Will monitor.    Problem: Fluid Volume Excess  Goal: Absence of edema; peripheral and/or pulmonary  Outcome: Not Met  Pt complained of some shortness of breath this morning.  Chest x-rays done and Dr. Wende Crease is aware of test result.  Given pt po Aldactone and iv Lasix as schedule.  Will monitor pt.

## 2014-06-27 NOTE — Plan of Care (Signed)
Problem: Breathing Pattern - Ineffective  Goal: Respiratory rate, rhythm and depth return to baseline  Outcome: Not Met  Pt continued to complained having some shortness of breath this morning.  Primary MD is informed.  Sat level remained good on o2 3 liters at 95%.  Chest x-rays completed as ordered.    Problem: Falls, Risk of  Goal: Keep patient free from falls utilizing universal fall precautions  Outcome: Met  Fall risk applied on patient.  Pt and family are encouraged to call for assistant.  Safety remained thus far.

## 2014-06-27 NOTE — Progress Notes (Signed)
Daily Progress Note:  06/21/2014     Current Hospital Stay:   7 days - Admitted on: 06/20/2014    Subjective:  Continues to c/o dyspnea.  S/p thoracentesis yesterday with small post procedural PTX noted.     Objective:    Vital Signs:  Temperature:  [97 F (36.1 C)-98.4 F (36.9 C)] 98.4 F (36.9 C) (11/17 1145)  Blood pressure (BP): (112-148)/(30-56) 126/54 mmHg (11/17 1145)  Heart Rate:  [80-94] 88 (11/17 1145)  Respirations:  [16-20] 20 (11/17 1145)  Pain Score:  [-] 0 (11/17 1145)  O2 Device:  [-] Nasal cannula (11/17 1059)  O2 Flow Rate (L/min):  [3 l/min-4 l/min] 3.5 l/min (11/17 1059)  SpO2:  [90 %-99 %] 98 % (11/17 1145)    Wt Readings from Last 1 Encounters:   06/27/14 53.434 kg (117 lb 12.8 oz)       Intake/Output (Current Shift):  11/17 0600 - 11/17 1759  In: 47 [P.O.:477]  Out: 250 [Urine:250]      Physical Exam:  GEN: elderly, spanish speaking, thin female in NAD. Cooperative, responding appropriately.   HEENT: NCAT, neck supple, anicteric sclera, EOMI, OP clear   CV: rrr, no m/r/g   Pulmonary: decreased BS over entire R lung field   Gastrointestinal: +NABS, soft but distended, nt   Neuro: no asterixis     Laboratory data:   Lab Results   Component Value Date    NA 130* 06/27/2014    K 4.2 06/27/2014    CL 91* 06/27/2014    BICARB 27 06/27/2014    BUN 22* 06/27/2014    CREAT 0.82 06/27/2014    GLU 136* 06/27/2014    [place] 8.9 06/27/2014     Lab Results   Component Value Date    WBC 4.9 06/27/2014    HGB 12.2 06/27/2014    HCT 33.4* 06/27/2014    PLT 57* 06/27/2014    SEG 71 06/27/2014    LYMPHS 14* 06/27/2014    MONOS 13* 06/27/2014    EOS 2 06/27/2014     Lab Results   Component Value Date    AST 40* 06/27/2014    ALT 34* 06/27/2014    ALK 143* 06/27/2014    TBILI 2.12* 06/27/2014    TP 7.6 06/27/2014    ALB 3.2* 06/27/2014     CXR 11/17  Reaccumulation of the right pleural fluid with complete right lung   atelectasis. No evidence pneumothorax.  Healed left humeral head fracture at the surgical  neck.  No other significant interval change.    TTE:   EF: 72%  Conclusions:  1) LV is small and has normal systolic function.  2) Mild LV diastolic dysfunction suggesting normal or low left atrial  pressure.  3) Small pericardial effusion without evidence of tamponade.  4) Compared to previous study on 10/16/2011, no significant change.  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     #. Sob 2/2 right pleural effusion: likely due to dietary indiscretion but no improvement with diuresis. S/p thoracentesis in IR 11/13 with 1.3L removed. IR performed repeat thoracentesis yesterday with small post procedural PTX noted but repeat CXR with re-accumulation of pleural fluid.   Cont Diuresis  with lasix 80 iv, aldactone 200 mg daily  Strict I/O  Low Na diet   Nutrition consulted for education on low salt diet, appreciate recs   IR consult to evaluate patient for TIPS placement      #. Hep C Cirrhosis: s/p treatment of Hep C with SVR achieved   Ascites-diuretics per above   HE-mental status clear, cont rifaximin  EV-sm distal EV 5/13: propanolol dc'd at last admission 2/2 bradycardia   Continue cipro for SBP ppx   HCC: imaging done in May, will get quad phase CT today per IR recs to evaluate known PVT as well as for [facility] screening     #. DM: on lantus and glipizide as outpatient. BG poorly controlled on admission.  Cont lantus, lispro qac, SSI. Diabetes team consulted, appreciate recs.    FEN: low sodium diet, cont cal count, NPO at midnight for TIPS procedure tomorrow   PPx: SCDs, Hold chemoppx given thrombocytopenia  FC/FC

## 2014-06-27 NOTE — Plan of Care (Signed)
Problem: Breathing Pattern - Ineffective  Goal: Respiratory rate, rhythm and depth return to baseline  Outcome: Resolved Date Met:  06/27/14  Patient walked to bathroom and complain of dyspnea on exertion, oxygen saturation >92%, rechecked when back on bed and 99 % Lungs decreased R > L  Will continue to monitor.

## 2014-06-27 NOTE — Consults (Signed)
Vascular and Interventional Radiology Consultation Note    Date: June 27, 2014   Patient Name: Paula Manning   Medical Record #: [id number]   DOB: November 27, 1940, Age: 73 year old  Gender: female    Requesting Provider: Dr. 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  Vascular/interventional radiology consulted for elective TIPS procedure.     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    Type II or unspecified type diabetes mellitus with peripheral circulatory disorders, not stated as uncontrolled(250.70) 06/10/2007    HTN 06/10/2007    HTN 06/10/2007       ALLERGIES:No Known Allergies    Current Facility-Administered Medications   Medication    calcium carbonate suspension 600 mg    ciprofloxacin (CIPRO) tablet 500 mg    dextrose 50 % solution 12.5 g    famotidine (PEPCID) tablet 20 mg    furosemide (LASIX) injection 80 mg    glucagon (GLUCAGON) injection 1 mg    glucose chewable tablet 16 g    glucose oral gel 1 Tube    insulin glargine (LANTUS) injection 26 Units    insulin lispro (HUMALOG) injection 1-10 Units    insulin lispro (HUMALOG) injection 10 Units    insulin lispro (HUMALOG) injection 6 Units    multivitamin with minerals (THERA M PLUS) tablet 1 tablet    nalOXone (NARCAN) injection 0.1 mg    ondansetron (ZOFRAN) injection 4 mg    phytonadione (VITAMIN K) 10 mg in sodium chloride 0.9 % 50 mL IVPB    rifaximin (XIFAXAN) tablet 550  mg    sodium chloride (PF) 0.9 % flush 3 mL    sodium chloride (PF) 0.9 % flush 3 mL    sodium chloride 0.9 % 100 mL IV flush    sodium chloride 0.9% infusion    spironolactone (ALDACTONE) tablet 200 mg       Physical Exam  BP 112/49 mmHg   Pulse 86   Temp(Src) 97.3 F (36.3 C)   Resp 18   Ht $R'5\' 1"'IK$  (1.549 m)   Wt 53.434 kg (117 lb 12.8 oz)   BMI 22.27 kg/m2   SpO2 95%    Laboratory data:   Lab Results   Component Value Date    INR 1.6 06/27/2014    PTT 21.4 11/01/2013     Lab Results   Component Value Date    WBC 4.9 06/27/2014    HGB 12.2 06/27/2014    HCT 33.4 06/27/2014    PLT 57 06/27/2014    PLT 47 04/23/2009    LYMPHS 14 06/27/2014    LYMPHS 16 05/18/2008     Lab Results   Component Value Date    NA 130 06/27/2014    K 4.2 06/27/2014    CL 91  06/27/2014    BICARB 27 06/27/2014    BUN 22 06/27/2014    CREAT 0.82 06/27/2014    GLU 136 06/27/2014    [place] 8.9 06/27/2014     Lab Results   Component Value Date    AST 40 06/27/2014    ALT 34 06/27/2014    ALK 143 06/27/2014    TBILI 2.12 06/27/2014    DBILI 0.4 08/31/2012    TP 7.6 06/27/2014    ALB 3.2 06/27/2014       Relevant Imaging:    CXR today 11/17- right hydrothorax and small apical pneumothorax        US abdomen 06/22/14 showing PV thrombus        TTE:   PA Pressure 25 20 - 30 mm Hg + CVP Final     Conclusions:  1) LV is small and has normal systolic function.  2) Mild LV diastolic dysfunction suggesting normal or low left atrial  pressure.  3) Small pericardial effusion without evidence of tamponade.  4) Compared to previous study on 10/16/2011, no significant change.       Assessment and Plan:  In summary, this patient is a 73 year old female with HCV cirrhosis and symptomatic refractory right hydrothorax which has failed medical management.  MELD 15 calculated with today's labs (see above).    - Please obtain quad phase CT abdomen to assess known PV thrombus.    - Plan for elective TIPS procedure tomorow.   - Patient will be NPO for the procedure.           Discussed with Dr. Bertram Millard VIR resident.   - Consent to be obtained.

## 2014-06-27 NOTE — Plan of Care (Signed)
Problem: Pain - Acute  Goal: Control of acute pain  Outcome: Met  Pain level assessed via patient.  Per pt, denied having pain.  Will monitor.    Problem: Mobility - Impaired  Goal: Able to ambulate within specified parameters  Outcome: Not Met  Pt has some shortness of breath today, and only able to get up to chair and bedside commode with 1 person's assistant.  Will monitor.    Problem: Alteration in Blood Glucose  Goal: Glucose level within specified parameters  Outcome: Met  Pt's blood glucose is being check AC and HS.  Levels were a little high with insulin given as schedule.  Will monitor.

## 2014-06-28 ENCOUNTER — Other Ambulatory Visit (INDEPENDENT_AMBULATORY_CARE_PROVIDER_SITE_OTHER): Payer: Self-pay | Admitting: Internal Medicine

## 2014-06-28 ENCOUNTER — Inpatient Hospital Stay (HOSPITAL_BASED_OUTPATIENT_CLINIC_OR_DEPARTMENT_OTHER): Payer: 59 | Admitting: Certified Registered Nurse Anesthetist

## 2014-06-28 ENCOUNTER — Encounter (HOSPITAL_BASED_OUTPATIENT_CLINIC_OR_DEPARTMENT_OTHER): Payer: Self-pay

## 2014-06-28 ENCOUNTER — Encounter (HOSPITAL_COMMUNITY): Admission: EM | Disposition: A | Discharge status: Home Health | Payer: Self-pay | Attending: Internal Medicine

## 2014-06-28 DIAGNOSIS — C799 Secondary malignant neoplasm of unspecified site: Secondary | ICD-10-CM

## 2014-06-28 DIAGNOSIS — K7460 Unspecified cirrhosis of liver: Secondary | ICD-10-CM

## 2014-06-28 DIAGNOSIS — J948 Other specified pleural conditions: Secondary | ICD-10-CM

## 2014-06-28 DIAGNOSIS — R0602 Shortness of breath: Secondary | ICD-10-CM

## 2014-06-28 LAB — ARTERIAL BLOOD GAS
BE, Art: 2.8 mmol/L — ABNORMAL HIGH (ref <=2.3)
FIO2: 100 %
HCO3, Art: 26 mmol/L (ref 20–29)
Temp: 37 'C
pCO2, Art (T): 34 mmHg — ABNORMAL LOW (ref 36–46)
pCO2, Art (Uncorr): 34 mmHg (ref 36–46)
pH, Art (T): 7.5 — ABNORMAL HIGH (ref 7.35–7.46)
pH, Art (Uncorr): 7.5 (ref 7.35–7.46)
pO2, Art (T): 238 mmHg — ABNORMAL HIGH (ref 74–109)
pO2, Art (Uncorr): 238 mmHg (ref 74–109)

## 2014-06-28 LAB — PHOSPHORUS, BLOOD: Phosphorous: 3.4 mg/dL (ref 2.7–4.5)

## 2014-06-28 LAB — COMPREHENSIVE METABOLIC PANEL, BLOOD
ALT (SGPT): 32 U/L (ref 0–33)
AST (SGOT): 38 U/L — ABNORMAL HIGH (ref 0–32)
Albumin: 3 g/dL — ABNORMAL LOW (ref 3.5–5.2)
Alkaline Phos: 130 U/L (ref 35–140)
Anion Gap: 12 mmol/L (ref 7–15)
BUN: 24 mg/dL — ABNORMAL HIGH (ref 6–20)
Bicarbonate: 25 mmol/L (ref 22–29)
Bilirubin, Tot: 1.68 mg/dL — ABNORMAL HIGH (ref <1.20)
Calcium: 8.5 mg/dL (ref 8.5–10.6)
Chloride: 91 mmol/L — ABNORMAL LOW (ref 98–107)
Creatinine: 0.89 mg/dL (ref 0.51–0.95)
GFR: >60 mL/min
Glucose: 107 mg/dL (ref 70–115)
Potassium: 4.6 mmol/L (ref 3.5–5.1)
Sodium: 128 mmol/L — ABNORMAL LOW (ref 136–145)
Total Protein: 6.8 g/dL (ref 6.0–8.0)

## 2014-06-28 LAB — CBC WITH DIFF, BLOOD
ANC-Automated: 3.6 10*3/uL (ref 1.6–7.0)
Abs Eosinophils: 0.1 10*3/uL (ref 0.1–0.7)
Abs Lymphs: 0.6 10*3/uL — ABNORMAL LOW (ref 0.8–3.1)
Abs Monos: 0.5 10*3/uL (ref 0.2–0.8)
Eosinophils: 2 % (ref 1–4)
Hct: 32.4 % — ABNORMAL LOW (ref 34.0–45.0)
Hgb: 11.7 gm/dL (ref 11.2–15.7)
Lymphocytes: 13 % — ABNORMAL LOW (ref 19–53)
MCH: 32.6 pg — ABNORMAL HIGH (ref 26.0–32.0)
MCHC: 36.1 % — ABNORMAL HIGH (ref 32.0–36.0)
MCV: 90.3 um3 (ref 79.0–95.0)
MPV: 10.4 fL (ref 9.4–12.4)
Monocytes: 11 % (ref 5–12)
Plt Count: 49 10*3/uL — ABNORMAL LOW (ref 140–370)
RBC: 3.59 10*6/uL — ABNORMAL LOW (ref 3.90–5.20)
RDW: 15.2 % — ABNORMAL HIGH (ref 12.0–14.0)
Segs: 74 % — ABNORMAL HIGH (ref 34–71)
WBC: 4.9 10*3/uL (ref 4.0–10.0)

## 2014-06-28 LAB — APTT, BLOOD: PTT: 29 s (ref 25.0–34.0)

## 2014-06-28 LAB — HEMOGRAM, BLOOD
Hct: 29.4 % — ABNORMAL LOW (ref 34.0–45.0)
Hgb: 10.6 gm/dL — ABNORMAL LOW (ref 11.2–15.7)
MCH: 32.5 pg — ABNORMAL HIGH (ref 26.0–32.0)
MCHC: 36.1 % — ABNORMAL HIGH (ref 32.0–36.0)
MCV: 90.2 um3 (ref 79.0–95.0)
MPV: 9.7 fL (ref 9.4–12.4)
Plt Count: 72 10*3/uL — ABNORMAL LOW (ref 140–370)
RBC: 3.26 10*6/uL — ABNORMAL LOW (ref 3.90–5.20)
RDW: 15.3 % — ABNORMAL HIGH (ref 12.0–14.0)
WBC: 5.7 10*3/uL (ref 4.0–10.0)

## 2014-06-28 LAB — BILIRUBIN, DIR BLOOD: Bilirubin, Dir: 0.6 mg/dL — ABNORMAL HIGH (ref <0.2)

## 2014-06-28 LAB — POTASSIUM, ART WHOLE BLOOD: Potassium, Art: 3.9 mmol/L (ref 3.5–5.0)

## 2014-06-28 LAB — GLUCOSE (POCT)
Glucose (POCT): 115 mg/dL (ref 70–115)
Glucose (POCT): 151 mg/dL — ABNORMAL HIGH (ref 70–115)
Glucose (POCT): 90 mg/dL (ref 70–115)
Glucose (POCT): 173 mg/dL — ABNORMAL HIGH (ref 70–115)
Glucose (POCT): 131 mg/dL — ABNORMAL HIGH (ref 70–115)
Glucose (POCT): 135 mg/dL — ABNORMAL HIGH (ref 70–115)

## 2014-06-28 LAB — PREPARE PLATELET PHERESIS: Platelet Pheresis, Irradiated: TRANSFUSED

## 2014-06-28 LAB — BLOOD GAS HGB/HCT,ARTERIAL
Hct (Est), Art: 31 % — ABNORMAL LOW (ref 36–46)
Hgb, Art: 10.7 g/dL — ABNORMAL LOW (ref 12.0–16.0)

## 2014-06-28 LAB — FLUID CULTURE IN BLOOD BOTTLES: Fluid Culture Result: NO GROWTH

## 2014-06-28 LAB — MAGNESIUM, BLOOD: Magnesium: 2.1 mg/dL (ref 1.7–2.6)

## 2014-06-28 LAB — O2 SAT (CO-OX), ARTERIAL: O2 Sat, Art: 99.2 % (ref 94–100)

## 2014-06-28 LAB — CALCIUM, IONIZED, ARTERIAL: Ca Ionized, Art: 1.06 mmol/L — ABNORMAL LOW (ref 1.13–1.32)

## 2014-06-28 LAB — PROTHROMBIN TIME, BLOOD
INR: 1.5
INR: 1.6
PT,Patient: 16 s — ABNORMAL HIGH (ref 9.7–12.5)
PT,Patient: 17.5 s — ABNORMAL HIGH (ref 9.7–12.5)

## 2014-06-28 LAB — GLUCOSE, ARTERIAL WHOLE BLOOD: Glucose, Art: 61 mg/dL — ABNORMAL LOW (ref 65–110)

## 2014-06-28 LAB — STERILE FLUID CULTURE W/GRAM STAIN, AEROBIC: Fluid Culture Result: NO GROWTH

## 2014-06-28 LAB — SODIUM, ART WHOLE BLOOD: Sodium, Art: 127 mmol/L — ABNORMAL LOW (ref 135–145)

## 2014-06-28 LAB — FIBRINOGEN, BLOOD: Fibrinogen: 171 mg/dL — ABNORMAL LOW (ref 200–450)

## 2014-06-28 SURGERY — IR TIPS DIPS REVISION
Laterality: Right

## 2014-06-28 MED ORDER — ETOMIDATE 2 MG/ML IV SOLN
INTRAVENOUS | Status: DC | PRN
Start: 2014-06-28 — End: 2014-06-28
  Administered 2014-06-28: 6 mg via INTRAVENOUS

## 2014-06-28 MED ORDER — PHYTONADIONE 10 MG/ML IJ SOLN
10.0000 mg | Freq: Every day | INTRAMUSCULAR | Status: DC
Start: 2014-06-28 — End: 2014-06-28

## 2014-06-28 MED ORDER — FENTANYL CITRATE 0.05 MG/ML IJ SOLN
25.0000 ug | INTRAMUSCULAR | Status: DC | PRN
Start: 2014-06-28 — End: 2014-06-28

## 2014-06-28 MED ORDER — IODIXANOL 320 MG/ML IV SOLN
INTRAVENOUS | Status: DC | PRN
Start: 2014-06-28 — End: 2014-06-28
  Administered 2014-06-28 (×2): 28 mL via INTRAVENOUS

## 2014-06-28 MED ORDER — MEPERIDINE HCL 25 MG/ML IJ SOLN
12.5000 mg | INTRAMUSCULAR | Status: DC | PRN
Start: 2014-06-28 — End: 2014-06-28

## 2014-06-28 MED ORDER — FENTANYL CITRATE 0.05 MG/ML IJ SOLN
INTRAMUSCULAR | Status: DC | PRN
Start: 2014-06-28 — End: 2014-06-28
  Administered 2014-06-28: 25 ug via INTRAVENOUS
  Administered 2014-06-28: 50 ug via INTRAVENOUS
  Administered 2014-06-28: 25 ug via INTRAVENOUS
  Administered 2014-06-28: 100 ug via INTRAVENOUS

## 2014-06-28 MED ORDER — LACTULOSE 10 GM/15ML OR SOLN
20.0000 g | Freq: Three times a day (TID) | ORAL | Status: DC
Start: 2014-06-28 — End: 2014-06-30
  Administered 2014-06-28 – 2014-06-29 (×4): 20 g via ORAL
  Filled 2014-06-28 (×5): qty 30

## 2014-06-28 MED ORDER — LIDOCAINE HCL (CARDIAC) 20 MG/ML IV SOLN
INTRAVENOUS | Status: DC | PRN
Start: 2014-06-28 — End: 2014-06-28
  Administered 2014-06-28: 25 mg via INTRAVENOUS

## 2014-06-28 MED ORDER — SODIUM CHLORIDE 0.9 % IV SOLN
10.0000 mg | INTRAVENOUS | Status: AC
Start: 2014-06-28 — End: 2014-06-29
  Administered 2014-06-28 – 2014-06-29 (×2): 10 mg via INTRAVENOUS
  Filled 2014-06-28 (×2): qty 1

## 2014-06-28 MED ORDER — LACTATED RINGERS IV SOLN
INTRAVENOUS | Status: DC
Start: 2014-06-28 — End: 2014-06-28

## 2014-06-28 MED ORDER — DEXTROSE 50 % IV SOLN
INTRAVENOUS | Status: DC | PRN
Start: 2014-06-28 — End: 2014-06-28
  Administered 2014-06-28: 12.5 g via INTRAVENOUS

## 2014-06-28 MED ORDER — METOCLOPRAMIDE HCL 5 MG/ML IJ SOLN
INTRAMUSCULAR | Status: DC | PRN
Start: 2014-06-28 — End: 2014-06-28
  Administered 2014-06-28: 10 mg via INTRAVENOUS

## 2014-06-28 MED ORDER — VITAMIN D 1000 UNIT OR TABS
2000.0000 [IU] | ORAL_TABLET | Freq: Every day | ORAL | Status: DC
Start: 2014-06-28 — End: 2014-07-04
  Administered 2014-06-29 – 2014-07-04 (×6): 2000 [IU] via ORAL
  Filled 2014-06-28 (×6): qty 2

## 2014-06-28 MED ORDER — SODIUM CHLORIDE 0.9 % IJ SOLN (CUSTOM)
3.0000 mL | INTRAMUSCULAR | Status: DC | PRN
Start: 2014-06-28 — End: 2014-06-28

## 2014-06-28 MED ORDER — SODIUM CHLORIDE 0.9 % IV SOLN
12.5000 mg | Freq: Once | INTRAVENOUS | Status: DC | PRN
Start: 2014-06-28 — End: 2014-06-28

## 2014-06-28 MED ORDER — VECURONIUM BROMIDE 10 MG IV SOLR
INTRAVENOUS | Status: DC | PRN
Start: 2014-06-28 — End: 2014-06-28
  Administered 2014-06-28: 5 mg via INTRAVENOUS

## 2014-06-28 MED ORDER — ONDANSETRON HCL 4 MG/2ML IV SOLN
4.0000 mg | Freq: Once | INTRAMUSCULAR | Status: DC | PRN
Start: 2014-06-28 — End: 2014-06-28
  Administered 2014-06-28: 4 mg via INTRAVENOUS
  Filled 2014-06-28: qty 2

## 2014-06-28 MED ORDER — NEOSTIGMINE METHYLSULFATE 10 MG/10ML IV SOLN
INTRAVENOUS | Status: DC | PRN
Start: 2014-06-28 — End: 2014-06-28
  Administered 2014-06-28: 1.5 mg via INTRAVENOUS

## 2014-06-28 MED ORDER — CEFAZOLIN SODIUM 1 GM IJ SOLR
INTRAMUSCULAR | Status: DC | PRN
Start: 2014-06-28 — End: 2014-06-28
  Administered 2014-06-28: 1000 mg via INTRAVENOUS

## 2014-06-28 MED ORDER — PROPOFOL IV BOLUS 10 MG/ML
INTRAVENOUS | Status: DC | PRN
Start: 2014-06-28 — End: 2014-06-28
  Administered 2014-06-28: 30 mg via INTRAVENOUS

## 2014-06-28 MED ORDER — SODIUM CHLORIDE 0.9 % IJ SOLN (CUSTOM)
3.0000 mL | Freq: Three times a day (TID) | INTRAMUSCULAR | Status: DC
Start: 2014-06-28 — End: 2014-06-28
  Administered 2014-06-28: 3 mL via INTRAVENOUS

## 2014-06-28 MED ORDER — GLYCOPYRROLATE 1 MG/5ML IJ SOLN
INTRAMUSCULAR | Status: DC | PRN
Start: 2014-06-28 — End: 2014-06-28
  Administered 2014-06-28: .3 mg via INTRAVENOUS

## 2014-06-28 MED ORDER — NALOXONE HCL 0.4 MG/ML IJ SOLN
0.1000 mg | INTRAMUSCULAR | Status: DC | PRN
Start: 2014-06-28 — End: 2014-06-28

## 2014-06-28 MED ORDER — FENTANYL CITRATE 0.05 MG/ML IJ SOLN
50.0000 ug | INTRAMUSCULAR | Status: DC | PRN
Start: 2014-06-28 — End: 2014-06-28

## 2014-06-28 MED ORDER — LACTATED RINGERS IV SOLN
INTRAVENOUS | Status: DC | PRN
Start: 2014-06-28 — End: 2014-06-28
  Administered 2014-06-28: 17:00:00 via INTRAVENOUS

## 2014-06-28 MED ORDER — LIDOCAINE, BUFFERED 1% IJ SOLN (COMPOUNDED)
INTRADERMAL | Status: DC | PRN
Start: 2014-06-28 — End: 2014-06-28
  Administered 2014-06-28: 10 mL via INTRADERMAL
  Administered 2014-06-28: 5 mL via INTRADERMAL

## 2014-06-28 MED ORDER — ONDANSETRON HCL 4 MG/2ML IV SOLN
INTRAMUSCULAR | Status: DC | PRN
Start: 2014-06-28 — End: 2014-06-28
  Administered 2014-06-28: 10 mg via INTRAVENOUS

## 2014-06-28 SURGICAL SUPPLY — 29 items
ADAPTER M/P CATH FEM L/L-UNIV (Kits/Sets/Trays) ×3 IMPLANT
APPLICATOR CHLORAPREP 26ML, ~~LOC~~ (Misc Medical Supply)
CATHETER ACUNAV REPROCESSED  8FR 90CM (Drains/Catheter/Tubes/Reservoir) ×3 IMPLANT
CATHETER BALLOON 8MMX4CM (Drains/Catheter/Tubes/Reservoir) ×3 IMPLANT
CATHETER BALLOON CONQUEST 7MM/4CM (Procedural wires/sheaths/catheters/balloons/dilators) ×3 IMPLANT
CATHETER CENTESIS 1-STEP 4FR 7CM (Lines/Drains) ×3
CATHETER OMNI FLUSH 5FR .035" 5FR X 100CM (Drains/Catheter/Tubes/Reservoir) ×3 IMPLANT
CATHETER TORCON NB ADV MPA 5FR X 65CM 0.038", BEACON TIP (Drains/Catheter/Tubes/Reservoir) ×3
COVER PROBE MICROTEK INTRAOPERATIVE 5" X 96" PC1308 (Drapes/towels) ×6 IMPLANT
DILATOR AQ HYDROPHILIC 12FR X 20CM (Procedural wires/sheaths/catheters/balloons/dilators) ×3 IMPLANT
DRAPE FLUORO APRON (Misc Medical Supply) ×6
FLOWSWITCH TIP HP (Misc Medical Supply) ×3 IMPLANT
GUIDEWIRE AMPLATZ SUPPORT 0.035" X 260CM, 7CM TPR, 3CM FLOPPY TIP (Kits/Sets/Trays) ×3 IMPLANT
GUIDEWIRE GLIDEWIRE ANGLED .035" X 150CM (Misc Medical Supply) ×3
INTRODUCER CHECKFLO 10F/40CM (Cannula) ×3 IMPLANT
KIT CUSTOM ANGIO SYRINGE THORNTON (Kits/Sets/Trays) ×3 IMPLANT
PREP CHLOROPREP 10.5ML 1-STEP (Misc Medical Supply) IMPLANT
PROCEDURE PACK - IR VASCULAR (Drape/Gowns/Gloves/Pack) ×3 IMPLANT
SET LIVER ACCESS ROSCH-U (Kits/Sets/Trays) ×3 IMPLANT
SET MICROPUNCTURE 5FR X 10CM, TRANSITIONLESS G43870 (Kits/Sets/Trays) ×6 IMPLANT
SNAP KAP DOME KOVER 30X36 STERILE (Misc Medical Supply) ×3 IMPLANT
STENT VIATORR 10MM 5/2CM (Stent) ×3 IMPLANT
SYRINGE INFLATION BASIX COMPAK 30ATM (Needles/punch/cannula/biopsy) ×3
SYRINGE MEDRAD PROVIS 150 ML & QFT (Needles/punch/cannula/biopsy) ×3 IMPLANT
TOWEL BLUE STERILE DISPOSABLE (Drape/Gowns/Gloves/Pack) ×3 IMPLANT
TRANSDUCER COMPASS UNIVERSALHG (Misc Medical Supply) ×3 IMPLANT
TRAY THORACENTESIS (Kits/Sets/Trays) ×3
TUBING FLEXIBLE HIGH PRESSURE 72" (Drains/Catheter/Tubes/Reservoir) ×3 IMPLANT
WIRE BENTSON (MERIT) .035 X 150CM (Procedural wires/sheaths/catheters/balloons/dilators) ×3 IMPLANT

## 2014-06-28 NOTE — Interdisciplinary (Signed)
Nutrition f/u note.    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  SOB 2/2 RT  PE. To IR today for TIPS.     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    Type II or unspecified type diabetes mellitus with peripheral circulatory disorders, not stated as uncontrolled(250.70) 06/10/2007    HTN 06/10/2007    HTN 06/10/2007     F/u wt:118 lbs today @ 112% IBW of 105 lbs. Within stable wt range since admit118-124 lbs. Pt s/p thora 11/17 -1.4 L and going today for TIPS.   Wt hx: UBW: 135 lbs x 1 year ago, was @ 128 lbs x 3 wks. Pt has lost wt though partly r/t fluids: 7.8% UBW x 3 wks and 13.6% x 1  Year.   Diet Rx: NPO today for IR, prior on carb limited,  2g Na diet.  1.5 L FR  GI:+NABS, soft /NT but distended. C/o early satiety, no appetite per family, +3 bm today so far/ on lactulose tx. Missing upper and lower teeth, dentures are on.   PO intake: pt on calorie count 11/14-17.   Average PO intake x 4 days: met 89% of low end needss (1071 kcal) and 76% of low end protein needs (54 g protein).   Pt likes the glucerna, cannot do it QID but willing to drink it TID and willing to use prosource. Per pt, reluctant to send  snacks given she feels so full with the meals and glucerna. Agreed to send tuna  salad and chicken salad scoops.   I/O: 1472/1225 ( adequacy deferred to MD). Net fluids since admit: -2.7 L  Labs: POCT-BS: 115-151, A1C@ 8.1(11/10),  Na @ 128 trends down, AP/ALT wnl, AST/ Tbili @ 38/1.68 both trend down, D bili @ 0.6, quantitative Zn @ 643.4(11/12), Vit D(25- [place])@ 22(11/12),   Meds: [place] carbonate, Cipro ( FDI noted), pepcid, lasix, lantus 26 units, lispro SSI , bolus insulin regimen, MVT with minerals, Vit K, rifaximin, spironolactone, zofran prn  Skin: ecchymosis, scattered bruises, BLEs discolored per RN head to toe assessment 11/18.   Edema: trace generalized edema per RN head to toe assessment 11/18.   Nutrition -Focussed Physical Exam (11/18): Moderate to severe fat loss to orbital region, moderate fat loss to upper arm region. Ribs are not visible. Moderate to severe muscle loss to temple region and acromion regions. Moderate muscle loss to patellar region,  gastrocnemius muscle, quadriceps and interosseous muscle. Clavicles are protruded.     Rev. Estimated Nutritional needs: REE Per [place](53.6  Kg):   64-80  g protein/day (1.2-1.5g/kg).  Maintenance Fluids: deferred to  MD/ on diuretics    D: Moderate malnutrition r/t chronic catabolic illness AEB pt w/ESLD, SOB 2/2 PE, ~13.6% wt loss x 1 year and per physical exam findings.     I: GOAL (11/11) : Pt meet >75% of estimated needs with acceptable tolerance (met )  GOAL(11/18):Pt to meet 100% of estimated needs with acceptable tolerance by next f/u    Plan/Recs:   1-When clinically feasible to advance diet, rec to ADAT to carb limited, 2g Na  2- Rec to continue  glucerna TID/ no chocolate flavor and will provide high protein snacks BID   3- Highly rec to start prosource once daily to mix in glucerna to boost protein intake. Will increase frequency pending snacks intake  4- Rec to check Vit A and to start cholecalciferol 2000 units daily  5- Rec to co ntinue MVT with minerals  6- Rec to d/c calorie count  7- Rec daily wt to trend    Nutrition  POC(coordination of care): paged to MD Jami @ 1640  Education: re educated re low salt intake, less dining out from fast food places and to prepare and cook food at home for better Na use  Discharge: carb limited, 2g Na    RD to monitor/evaluate PO intake/dequacy,  GI condition, labs, wt trends, skin condition, and will f/u per policy  Rochele Pages, MS, RD pager # [phone]

## 2014-06-28 NOTE — RN OR/Procedure Note (Signed)
Rt chest post thoracentesis and Rt IJ post Tips both with gauze and tegaderm CDI no signs of bleeding or hematoma.

## 2014-06-28 NOTE — Progress Notes (Signed)
INPATIENT HEPATOLOGY/GASTROENTEROLOGY PROGRESS NOTE   Date:  06/28/2014   Author: [facility] Stay:   8 days - Admitted on: 06/20/2014    Overnight Events/Subjective:    --repeat CXR with reaccumulation of fluid but no report PTX seen anymore  --mild improvement in dyspnea compared to yesterday  --s/p CT scan with unchaged nonocclusive PVT  --NPO after MN for TIPS placement today    Medications:   calcium carbonate  600 mg BID    cholecalciferol  2,000 Units Daily    ciprofloxacin  500 mg Q24H    famotidine  20 mg Daily    furosemide  80 mg Daily    insulin glargine  26 Units Daily before lunch    insulin lispro  1-10 Units 4x Daily WC    insulin lispro  10 Units TID WC    insulin lispro  6 Units TID WC    multivitamin with minerals  1 tablet Daily    phytonadione (VITAMIN K) IVPB  10 mg Q24H    rifaximin  550 mg Q12H    sodium chloride (PF)  3 mL Q8H    spironolactone  200 mg Daily       Objective:    Vital Signs:  Temperature:  [98.2 F (36.8 C)-98.6 F (37 C)] 98.2 F (36.8 C) (11/18 0855)  Blood pressure (BP): (122-141)/(61-81) 141/75 mmHg (11/18 0855)  Heart Rate:  [83-88] 86 (11/18 0855)  Respirations:  [18] 18 (11/18 0855)  Pain Score:  [-] 0 (11/18 0855)  O2 Device:  [-] Nasal cannula (11/18 1150)  O2 Flow Rate (L/min):  [3 l/min] 3 l/min (11/18 1150)  SpO2:  [95 %-100 %] 97 % (11/18 1150)    Intake/Output (Current Shift):  11/17 0600 - 11/18 0559  In: 7782 [P.O.:1107; I.V.:315]  Out: 1100 [Urine:1100]    Physical Exam:  General: NAD  HEENT: No scleral icterus,NC in place, trachea midline, mucus membranes moist.  Lungs: Decreased BS on R side up to mid/upper lungs  Cardiovascular: Irregular  Abdomen: Distended with mild fluid wave  Extremities: Edema present bilaterally  Skin: Non-icteric and no visible rash  Neuro: No asterixis    Laboratory data:   Recent Labs      06/26/14   0534  06/27/14   0635  06/28/14   0518   NA  131*  130*  128*   K  4.2  4.2  4.6   CL  94*   91*  91*   BICARB  _0 BUN  24*  22*  24*   CREAT  0.91  0.82  0.89   [place]  8.7  8.9  8.5   MG   --    --   2.1   PHOS   --    --   3.4   TP  7.1  7.6  6.8   ALB  2.9*  3.2*  3.0*   TBILI  2.05*  2.12*  1.68*   DBILI   --    --   0.6*   AST  32  40*  38*   ALT  28  34*  32   ALK  132  143*  130       Recent Labs      06/26/14   0534  06/27/14   0635  06/28/14   0517   WBC  4.2  4.9  4.9   HGB  11.6  12.2  11.7   HCT  32.1*  33.4*  32.4*   MCV  90.7  90.3  90.3   PLT  45*  57*  49*   SEG  73*  71  74*   LYMPHS  14*  14*  13*   MONOS  11  13*  11   EOS  _0 Recent Labs      06/26/14   0534  06/27/14   0635  06/28/14   0517   INR  1.6  1.6  1.5       Imaging/Studies:  ABD U/S  IMPRESSION:  1. Large right pleural effusion.    2. Cirrhotic liver with sequela of portal hypertension including ascites and   splenomegaly.    3. 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 The   right and left portal veins remain patent with flow in the proper direction.    4. 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    TTE 11/16  Conclusions:  1) LV is small and has normal systolic function.  2) Mild LV diastolic dysfunction suggesting normal or low left atrial  pressure.  3) Small pericardial effusion without evidence of tamponade.  4) Compared to previous study on 10/16/2011, no significant change.     CT abd/pelvis 11/17  IMPRESSION:  CT scan of the abdomen and pelvis with and without IV contrast    1. 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 Additional extension to the splenic vein.   Hepatic veins are patent.    2. Sequelae of portal hypertension including splenomegaly, paraesophageal,   rectal, perisplenic, and perivertebral varices. Moderate ascites.    3. No reportable LI-RADS  lesions.    4. 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     #R pleural effusion: Repeat thoracentesis c/w hydrothorax. No signs of infection or new onset CHF at this time. Seems to be recurreing despite diuresis and salt restriction.  S/p thora on 11/13 and 11/16 now w/ small PTX    --planning for TIPS placement of medical refractory hydrothorax  --cont diuretic lasix 80IV daily, aldactone to 200 daily, will need to optimize diuretics after TIPS placement as requirements will likely decrease over next several weeks to months after placement  --wean O2 as tolerated  --strict IOs, daily wts  --low Na diet, nutrition c/s placed    #HCV GT2 cirrhosis with SVR c/b EV/ascites/hydrothorax: MELD 15. Relative stable disease.  TTE late positive bubble for shunt.     - low salt diet, diuresis with IV lasix + spironolactone  - strict I/Os + daily wts    *EV: 05/2013 grade I EV. BB stopped in setting of bradycardia, TIPS placed for HHT which should also decompress.   *PVT: Repeat CT demonstrating non-obstructive thrombus in main PV/L PV with extension into splenic vein.  No anticoagulation at this time.   *Ascites: No SBP  on dx para 11/10. Cont cipro ppx. On IV lasix + spironolactone. Low salt diet  *HE: On rifaximin, unclear if previously intolerant of lactulose  *HCC: Imaging from 06/2014 without HCC. Serial imaging for IPMN    DWA [place]    Harle Stanford, MD  [facility]

## 2014-06-28 NOTE — Anesthesia Preprocedure Evaluation (Addendum)
ANESTHESIA PRE-OPERATIVE EVALUATION    Patient Information    Name: Paula Manning    MRN: [id number]    DOB: 27-Dec-1940    Age: 73 year old    Sex: female  Procedure(s):  RAD REVISION OF TIPS OR DIPS STENT      BP 141/75 mmHg   Pulse 86   Temp(Src) 36.8 C   Resp 18   Ht $R'5\' 1"'ZW$  (1.549 m)   Wt 53.57 kg (118 lb 1.6 oz)   BMI 22.33 kg/m2   SpO2 100%   BMI kg/m2: 23.43 kg/m2    Primary language spoken:  Spanish    ROS/Medical History:  General Review & History of Anesthetic Complications:         Cardiovascular:    Exercise tolerance: <4 METS  (+)    hypertension           Pulmonary:     (+) COPD, , , , ,     ROS comment: S/p Thoracentesis 11/13 1.3 L evacuated leaving small post procedural PTX Hematology/Oncology:   (+) anemia,coagulopathy (thrombocytopenic),   Neuro/Psych:           Infectious Disease:            Endo/Other:      (+) diabetes mellitus type 2, hypothyroidism,  GI/Hepatic:     (+) hepatitis Hep C, liver disease (Cirrhosis, Ascities),     ROS Comments: Esophageal Varices   Renal:            Pregnancy History:             [facility]) Notes:     by [facility] provider  [facility] Echo/ECG/Angio test available. [facility] [place] labs available.    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   CXR 11/17  Reaccumulation of the right pleural fluid with complete right lung   atelectasis. No evidence pneumothorax.  Healed left humeral head fracture at the surgical neck.  No other significant interval change.    TTE:   EF: 72%  Conclusions:  1) LV is small and has normal systolic function.  2) Mild LV diastolic dysfunction suggesting normal or low left atrial  pressure.  3) Small pericardial effusion without evidence of tamponade.  4) Compared to previous study on 10/16/2011, no significant change.  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   CXR 11/17  Reaccumulation of the right pleural fluid with complete right lung   atelectasis. No evidence pneumothorax.  Healed left humeral head fracture at the surgical neck.  No other significant interval change.    TTE:   EF: 72%  Conclusions:  1) LV is small and has normal systolic function.  2) Mild LV diastolic dysfunction suggesting normal or low left atrial  pressure.  3) Small pericardial effusion without evidence of tamponade.  4) Compared to previous study on 10/16/2011, no significant change.  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   :  Last  OSA (STOP BANG) Score:  No Data Recorded    Last OSA Score for   No Data Recorded                 Past Medical History   Diagnosis Date    Migraine headache     Glaucoma     Ascites 06/29/2007     History of paracentesis x 2    Pancreatic cyst 06/29/2007    Cirrhosis 06/24/2007     Active on the liver transplant waiting list with a blood type of O positive. 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    Type II or unspecified type diabetes mellitus with peripheral circulatory disorders, not stated as uncontrolled(250.70) 06/10/2007    HTN 06/10/2007    HTN 06/10/2007     Past Surgical History   Procedure Laterality Date    Pb laps surg cholecstc w/expl common duct  1970s     in [place];  open CCX    Pb cesarean delivery only      Pb cstoplasty/cstourtp plstc any       Bladder suspension    Egd procedure  06/01/2013     Procedure: GI EGD;  Surgeon: Jeanette Caprice, MD;  Location:  [facility];  Service: Gastroenterology;;     History   Substance Use Topics    Smoking status: Former Smoker -- 10 years     Quit date: 11/13/2012    Smokeless tobacco: Never Used    Alcohol Use: No       Current Facility-Administered Medications   Medication Dose Route Frequency Provider Last Rate Last Dose    calcium carbonate suspension 600 mg  600 mg Oral BID Van Clines, MD   600 mg at 06/28/14 0854    ciprofloxacin (CIPRO) tablet 500 mg  500 mg Oral Q24H Komsoukaniants, Arkady, MD   500 mg at 06/28/14 0725    dextrose 50 % solution 12.5 g  12.5 g IntraVENOUS PRN Van Clines, MD        famotidine (PEPCID) tablet 20 mg  20 mg Oral Daily Van Clines, MD   20 mg at 06/28/14 0855    furosemide (LASIX) injection 80 mg  80 mg IntraVENOUS Daily Lebron Conners., MD   80 mg at 06/28/14 0855    glucagon (GLUCAGON) injection 1 mg  1 mg IntraMUSCULAR Once PRN Van Clines, MD        glucose chewable tablet 16 g  4 tablet Oral PRN Van Clines, MD        glucose oral gel 1 Tube  1 Tube Oral PRN Wende Crease, Humaira, MD        insulin glargine (LANTUS) injection 26 Units  26 Units Subcutaneous Daily before lunch Lebron Conners., MD   26 Units at 06/27/14 1316    insulin lispro (HUMALOG) injection 1-10 Units  1-10 Units Subcutaneous 4x Daily WC Lebron Conners., MD   4 Units at 06/27/14 1317    insulin lispro (HUMALOG) injection 10 Units  10 Units Subcutaneous TID WC Lebron Conners., MD   10 Units at 06/27/14 1853    insulin lispro (HUMALOG) injection 6 Units  6 Units Subcutaneous TID WC Lebron Conners., MD   6 Units at 06/27/14 [phone]  multivitamin with minerals (THERA M PLUS) tablet 1 tablet  1 tablet Oral Daily Lebron Conners., MD   1 tablet at 06/28/14 0854    nalOX[facility] LLC) injection 0.1 mg  0.1 mg IntraVENOUS Q2 Min PRN Van Clines, MD        ondansetron (ZOFRAN) injection 4 mg  4 mg IntraVENOUS Q6H PRN Van Clines, MD        phytonadione (VITAMIN K) 10 mg in sodium  chloride 0.9 % 50 mL IVPB  10 mg IntraVENOUS Q24H Van Clines, MD        rifaximin (XIFAXAN) tablet 550 mg  550 mg Oral Q12H Van Clines, MD   550 mg at 06/28/14 0855    sodium chloride (PF) 0.9 % flush 3 mL  3 mL IntraVENOUS Q8H Jami, Mechele Dawley, MD   3 mL at 06/28/14 0456    sodium chloride (PF) 0.9 % flush 3 mL  3 mL IntraVENOUS PRN Van Clines, MD        sodium chloride 0.9% infusion   IntraVENOUS Continuous PRN Van Clines, MD        spironolactone (ALDACTONE) tablet 200 mg  200 mg Oral Daily Lebron Conners., MD   200 mg at 06/28/14 0854     No Known Allergies    Labs and Other Data  Lab Results   Component Value Date    NA 128 06/28/2014    K 4.6 06/28/2014    CL 91 06/28/2014    BICARB 25 06/28/2014    BUN 24 06/28/2014    CREAT 0.89 06/28/2014    GLU 107 06/28/2014    [place] 8.5 06/28/2014     Lab Results   Component Value Date    AST 38 06/28/2014    ALT 32 06/28/2014    GGT 12 10/16/2004    LDH 351 12/27/2010    ALK 130 06/28/2014    TP 6.8 06/28/2014    ALB 3.0 06/28/2014    TBILI 1.68 06/28/2014    DBILI 0.6 06/28/2014     Lab Results   Component Value Date    WBC 4.9 06/28/2014    RBC 3.59 06/28/2014    HGB 11.7 06/28/2014    HCT 32.4 06/28/2014    MCV 90.3 06/28/2014    MCHC 36.1 06/28/2014    RDW 15.2 06/28/2014    PLT 49 06/28/2014    PLT 47 04/23/2009    MPV 10.4 06/28/2014    SEG 74 06/28/2014    SEG 70 05/18/2008    LYMPHS 13 06/28/2014    LYMPHS 16 05/18/2008    MONOS 11 06/28/2014    MONOS 6 05/18/2008    EOS 2 06/28/2014    EOS 1 05/18/2008    BASOS 1 06/24/2014    BASOS 1 06/03/2004     Lab Results   Component Value Date    INR 1.5 06/28/2014    PTT 21.4 11/01/2013     Lab Results   Component Value Date    ARTPH 7.45 02/14/2008    ARTPO2 86 02/14/2008    ARTPCO2 36 02/14/2008       Anesthesia Plan:  ASA 3 (Severe systemic disease)     Planned induction method: general   Planned monitoring method: Routine monitoring      Comments:    Dental risks explained & discussed  Anesthetic plan  and risks discussed with Patient.     Use of blood products discussed with patient whom.  Plan discussed with CRNA.

## 2014-06-28 NOTE — Plan of Care (Signed)
Problem: Pain - Acute  Goal: Control of acute pain  Outcome: Met  Pt denies pain. Encouraged to call if begin to have pain.    Problem: Breathing Pattern - Ineffective  Goal: Respiratory rate, rhythm and depth return to baseline  Outcome: Met  Pts VSS, LS clear, o2 94-100% on 3L NC, denies any SOB, +DOE. Encouraged to call if begins to feel SOB.    Problem: Mobility - Impaired  Goal: Able to ambulate within specified parameters  Outcome: Met  Pt is OOB to [facility] with 1A, gait slighty unsteady.    Problem: Falls, Risk of  Goal: Keep patient free from falls utilizing universal fall precautions  Outcome: Met  OOB to BSC with 1A, gait slightly unsteady, family at bedside, bed alarm on, encouraged to call for assist, remains free from injury.    Problem: Discharge Planning  Goal: Participation in care planning  Outcome: Met    Problem: Fluid Volume Excess  Goal: Absence of edema; peripheral and/or pulmonary  Outcome: Met  Pts VSS, LS clear, denies SOB, no edema noted, abd distended, +BS. Pt remains on 1.5L fluid restriction. Pt voiding clear yellow urine in BSC, I&Os maintained.     Problem: Alteration in Blood Glucose  Goal: Glucose level within specified parameters  Outcome: Met  BS monitored as ordered, no insulin needed tonight per orders. No overt s/s of hyper/hypoglycemia noted.

## 2014-06-28 NOTE — Plan of Care (Signed)
Problem: Pain - Acute  Goal: Control of acute pain  Outcome: Met  Denies any pain.appears comfortable. Able to get out of bed to use bedside commode with assist.    Problem: Fluid Volume Excess  Goal: Absence of edema; peripheral and/or pulmonary  Outcome: Not Met  Pt has distended abdomen and also has pleural effusion. Lasix 80 mg iv given. Voiding well after. Pt is npo but on fluid restrictions of 1.5 liters /day.also she uses 3 liters of oxygen/nasal cannula.DOE noted.    Problem: Alteration in Blood Glucose  Goal: Glucose level within specified parameters  Outcome: Met  Fingerstick checked every 6 hours due to being npo. FS around 1200 was 151.covered with 3 units of lispro per sliding scale ordered. Also scheduled lantus 26 units given around 1130.npo maintained due to scheduled procedure today. No hypoglycemia noted.

## 2014-06-28 NOTE — Plan of Care (Signed)
Problem: Breathing Pattern - Ineffective  Goal: Respiratory rate, rhythm and depth return to baseline  Outcome: Not Met  Pt had DOE. Using 3 liters of oxygen per nasal cannula. Sat 100 %. Lungs are diminished. No acute respiratory distress noted.    Problem: Falls, Risk of  Goal: Keep patient free from falls utilizing universal fall precautions  Outcome: Met  hourly rounding done. Bed alarm turned on.5 P's observed. Instructed to call for assist. Call light within reach and bed to lowest locked position.Assisted to the bathroom and bedside commode.voided and had bm x 3 the patient and family compliant in calling for assist.no falls.

## 2014-06-28 NOTE — Progress Notes (Signed)
Daily Progress Note:  06/21/2014     Current Hospital Stay:   8 days - Admitted on: 06/20/2014    Subjective:  Reports dyspnea mildly improved today.    Objective:    Vital Signs:  Temperature:  [98.2 F (36.8 C)-99 F (37.2 C)] 98.2 F (36.8 C) (11/18 0855)  Blood pressure (BP): (122-141)/(54-81) 141/75 mmHg (11/18 0855)  Heart Rate:  [83-88] 86 (11/18 0855)  Respirations:  [18-20] 18 (11/18 0855)  Pain Score:  [-] 0 (11/18 0855)  O2 Device:  [-] Nasal cannula (11/18 1150)  O2 Flow Rate (L/min):  [3 l/min-4 l/min] 3 l/min (11/18 1150)  SpO2:  [95 %-100 %] 97 % (11/18 1150)    Wt Readings from Last 1 Encounters:   06/28/14 53.57 kg (118 lb 1.6 oz)       Intake/Output (Current Shift):  11/18 0600 - 11/18 1759  In: 90 [P.O.:40; I.V.:50]  Out: 300 [Urine:300]      Physical Exam:  GEN: elderly, spanish speaking, thin female in NAD. Cooperative, responding appropriately.   HEENT: NCAT, neck supple, anicteric sclera, EOMI, OP clear   CV: rrr  Pulmonary: decreased BS over entire R lung field   Gastrointestinal: +NABS, soft but distended, nt   Neuro: no asterixis     Laboratory data:   Lab Results   Component Value Date    NA 128* 06/28/2014    K 4.6 06/28/2014    CL 91* 06/28/2014    BICARB 25 06/28/2014    BUN 24* 06/28/2014    CREAT 0.89 06/28/2014    GLU 107 06/28/2014     8.5 06/28/2014     Lab Results   Component Value Date    WBC 4.9 06/28/2014    HGB 11.7 06/28/2014    HCT 32.4* 06/28/2014    PLT 49* 06/28/2014    SEG 74* 06/28/2014    LYMPHS 13* 06/28/2014    MONOS 11 06/28/2014    EOS 2 06/28/2014     Lab Results   Component Value Date    AST 38* 06/28/2014    ALT 32 06/28/2014    ALK 130 06/28/2014    TBILI 1.68* 06/28/2014    DBILI 0.6* 06/28/2014    TP 6.8 06/28/2014    ALB 3.0* 06/28/2014     CT a/p 11/17 prelim   1. 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 Additional extension to the splenic vein.   Hepatic veins are patent.  2. Sequelae  of portal hypertension including splenomegaly, paraesophageal,   rectal, perisplenic, and perivertebral varices. Moderate ascites.  3. No reportable LI-RADS lesions.  4. 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  2) Mild LV diastolic dysfunction suggesting normal or low left atrial  pressure.  3) Small pericardial effusion without evidence of tamponade.  4) Compared to previous study on 10/16/2011, no significant change.  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     #. Sob 2/2 right pleural effusion: likely  due to dietary indiscretion but no improvement with diuresis. S/p thoracentesis in IR 11/13, 11/16 (with small post procedural PTX noted that has since resolved) repeat CXR with re-accumulation of pleural fluid.   -Cont Diuresis with lasix 80 iv, aldactone 200 mg daily  -Strict I/O  -Low Na diet   -Nutrition consulted for education on low salt diet, appreciate recs   -IR to place TIPS today      #. Hep C Cirrhosis: s/p treatment of Hep C with SVR achieved   Ascites-diuretics per above   HE-mental status clear, cont rifaximin  EV-sm distal EV 5/13: propanolol dc'd at last admission 2/2 bradycardia   Continue cipro for SBP ppx   HCC: CT done yesterday     #. DM: on lantus and glipizide as outpatient. BG poorly controlled on admission.  -Cont lantus, lispro qac, SSI. Diabetes team consulted, appreciate recs.    FEN: currently NPO for procedure, low sodium diet, cont cal count  PPx: SCDs, Hold chemoppx given thrombocytopenia  FC/FC

## 2014-06-28 NOTE — RN OR/Procedure Note (Signed)
Pre-TIPS: RA=6 Wedge=36 Direct Portal=28  Post 6mm balloon: Portal=19 RA=11  Post 74mm balloon: Portal=17 RA=10

## 2014-06-28 NOTE — Interdisciplinary (Signed)
Received a call from [facility] ([phone]) stated that from the clinicals sent to her (Utilization Reviews) , patietn is stable for transfer to [facility] - which is in Network facility for this particular patient's coverage. Informed Multicultural CM Bethena Roys, patient is going for IR TIPS evaluation today, and I will let the TEAM know.  Spoke to the TEAM on rounds, and said, they will order transfer patient to [facility] ( Transfer Center# [phone]) after TIPS procedure today or tomorrow, if patient is medically stable for transfer.  Called and left voicemail message to [facility] [phone], awaiting call back.  CM following.

## 2014-06-28 NOTE — Interdisciplinary (Signed)
Pt is NPO for possible tips today. Morning medications given.with sips of water. Nutritional insulin held.

## 2014-06-28 NOTE — Interdisciplinary (Addendum)
Pt still not back from IR.family in the waiting room informed.endorsed to night nurse.

## 2014-06-28 NOTE — Interdisciplinary (Signed)
IR called and told the charge nurse to send pt down. Pt informed. 5 minutes after ,IR called back saying not to send pt yet because th instrument they are going to use is broken. Pt/family informed that IR will call back. Transporter came and also told him that it was cancelled.

## 2014-06-28 NOTE — Interdisciplinary (Signed)
IR called to send pt down . preop checklist completed. Pt has no consent in the chart but spoke to Joanne,pt has consent down in IR.Marland KitchenLeft via bed per transporter at 1425.with oxygen on.family came (10 of them)and saw pt left.

## 2014-06-28 NOTE — Progress Notes (Signed)
Endocrine/Diabetes Progress Note    Overnight: no acute events     Subjective:  Feeling sob, NPO for procedure today    Objective:  Temperature:  [98 F (36.7 C)-98.3 F (36.8 C)] 98 F (36.7 C) (11/18 1930)  Blood pressure (BP): (114-141)/(37-81) 125/45 mmHg (11/18 2015)  Heart Rate:  [60-88] 61 (11/18 2015)  Respirations:  [15-18] 15 (11/18 2015)  Pain Score:  [-] 0 (11/18 1935)  O2 Device:  [-] Blow-by (11/18 1935)  O2 Flow Rate (L/min):  [3 l/min-6 l/min] 6 l/min (11/18 1935)  SpO2:  [94 %-100 %] 94 % (11/18 2015)  Body mass index is 22.33 kg/(m^2).    GEN: NAD  PULM: breathing comfortably    Diet: CHO Limited + Glucerna tid    Blood Sugars reviewed:      AM Lu Di HS O/N      11.15.15 147 273 123 84  Lantus   26  Lispro  8+4 10+8+6  10     11.16.15 108 291 139 223  Lantus   26  Lispro  5+6 8+7 6+5 +4    11.17.15 129 195 117 127  Lantus   26  Lispro  10+6 +4 10+6    11.18  115 151  Lantus   26  Lispro   +3    A/P:  Paula Manning is a 73 year old female w/ uncontrolled DM2 c/b neuropathy as well as HepC cirrhosis, HTN admitted 06/20/2014 w/ 5 days SOB and found to have R pleural effusion thought 2/2 dietary indiscretion.     BG at goal, no changes in insulin regimen at this time    Inpatient Recs: paged to first call   - Continue lantus 26 units daily before lunch  - Cont 10 units ac tid, RN to give 0, 1/2, whole dose based on % tray consumed  - Cont Lispro 6 units tid AC, "FOR Glucerna COVERAGE, do not give if pt not drinking supplement."  - Cont High correction scale w lispro ac/hs    Outpatient Recs/Education:    1. Self-monitor blood glucose: has meter covered per insurance and strips    2. Healthy Eating: reviewed cho groups with family and pt, nurse to show Emmi videos    3. Activity:per MD    4. Hypoglycemia: need to review    5. Medication: likely modify dosages, but same, oral and insulin.    6. Discharge regimen:TBD, likely Lantus + orals    7. Follow-up: f/u w/ PCP Dr. Rayburn Felt.    Will cont  to follow

## 2014-06-28 NOTE — Interdisciplinary (Signed)
Not back from procedure yet. Still in IR.

## 2014-06-28 NOTE — Interdisciplinary (Signed)
Clinical Nutr Kcal Count Results:   Kcal Count note for day 4. Data from 06/27/2014.   EST Needs: [place] jeor (55kg) 1003 x 1.2-1.5  1204-1504kcals (22-27kcals/kg) 72-88g Pro/D (1.3-1.6g Pro/kg) Fluids per MD  PO Diet Rx: Carb Ltd+ 2g Na+ Glucerna TID  PO intake: 1105 kcal 50 grams Protein (2 meals recorded + 100% of 2 Glucerna Supplements supplements)   92% of low end energy needs met.   69% of low end protein needs met.   Will continue collecting kcal count data and RD to assess/summarize per policy  [place] [facility]

## 2014-06-29 LAB — COMPREHENSIVE METABOLIC PANEL, BLOOD
ALT (SGPT): 45 U/L — ABNORMAL HIGH (ref 0–33)
AST (SGOT): 63 U/L — ABNORMAL HIGH (ref 0–32)
Albumin: 2.7 g/dL — ABNORMAL LOW (ref 3.5–5.2)
Alkaline Phos: 122 U/L (ref 35–140)
Anion Gap: 14 mmol/L (ref 7–15)
BUN: 30 mg/dL — ABNORMAL HIGH (ref 6–20)
Bicarbonate: 24 mmol/L (ref 22–29)
Bilirubin, Tot: 2.98 mg/dL — ABNORMAL HIGH (ref <1.20)
Calcium: 8.9 mg/dL (ref 8.5–10.6)
Chloride: 93 mmol/L — ABNORMAL LOW (ref 98–107)
Creatinine: 1.14 mg/dL — ABNORMAL HIGH (ref 0.51–0.95)
GFR: 47 mL/min
Glucose: 156 mg/dL — ABNORMAL HIGH (ref 70–115)
Potassium: 4.6 mmol/L (ref 3.5–5.1)
Sodium: 131 mmol/L — ABNORMAL LOW (ref 136–145)
Total Protein: 6.3 g/dL (ref 6.0–8.0)

## 2014-06-29 LAB — CBC WITH DIFF, BLOOD
ANC-Automated: 8.5 10*3/uL — ABNORMAL HIGH (ref 1.6–7.0)
Abs Eosinophils: 0.1 10*3/uL (ref 0.1–0.7)
Abs Lymphs: 0.6 10*3/uL — ABNORMAL LOW (ref 0.8–3.1)
Abs Monos: 0.5 10*3/uL (ref 0.2–0.8)
Eosinophils: 1 % (ref 1–4)
Hct: 33.2 % — ABNORMAL LOW (ref 34.0–45.0)
Hgb: 12.1 gm/dL (ref 11.2–15.7)
Lymphocytes: 6 % — ABNORMAL LOW (ref 19–53)
MCH: 32.7 pg — ABNORMAL HIGH (ref 26.0–32.0)
MCHC: 36.4 % — ABNORMAL HIGH (ref 32.0–36.0)
MCV: 89.7 um3 (ref 79.0–95.0)
MPV: 9.3 fL — ABNORMAL LOW (ref 9.4–12.4)
Monocytes: 6 % (ref 5–12)
Plt Count: 83 10*3/uL — ABNORMAL LOW (ref 140–370)
RBC: 3.7 10*6/uL — ABNORMAL LOW (ref 3.90–5.20)
RDW: 15.3 % — ABNORMAL HIGH (ref 12.0–14.0)
Segs: 88 % — ABNORMAL HIGH (ref 34–71)
WBC: 9.7 10*3/uL (ref 4.0–10.0)

## 2014-06-29 LAB — GLUCOSE (POCT)
GLUCOSE (POCT): 172 mg/dL — ABNORMAL HIGH (ref 70–115)
GLUCOSE (POCT): 245 mg/dL — ABNORMAL HIGH (ref 70–115)
Glucose (POCT): 283 mg/dL — ABNORMAL HIGH (ref 70–115)
Glucose (POCT): 344 mg/dL — ABNORMAL HIGH (ref 70–115)

## 2014-06-29 LAB — BILIRUBIN, DIR BLOOD: BILIRUBIN, DIR: 0.9 mg/dL — ABNORMAL HIGH (ref <0.2)

## 2014-06-29 LAB — MAGNESIUM, BLOOD: MAGNESIUM: 2.2 mg/dL (ref 1.7–2.6)

## 2014-06-29 LAB — PROTHROMBIN TIME, BLOOD
INR: 1.6
PT,PATIENT: 17 s — ABNORMAL HIGH (ref 9.7–12.5)

## 2014-06-29 LAB — PHOSPHORUS, BLOOD: PHOSPHOROUS: 5.2 mg/dL — ABNORMAL HIGH (ref 2.7–4.5)

## 2014-06-29 MED ORDER — FUROSEMIDE 10 MG/ML IJ SOLN
40.0000 mg | Freq: Every day | INTRAMUSCULAR | Status: DC
Start: 2014-06-29 — End: 2014-06-30
  Administered 2014-06-29: 40 mg via INTRAVENOUS
  Filled 2014-06-29 (×2): qty 4

## 2014-06-29 MED ORDER — SPIRONOLACTONE 100 MG OR TABS
100.0000 mg | ORAL_TABLET | Freq: Every day | ORAL | Status: DC
Start: 2014-06-29 — End: 2014-06-30
  Administered 2014-06-29: 100 mg via ORAL
  Filled 2014-06-29 (×2): qty 1

## 2014-06-29 NOTE — Interdisciplinary (Signed)
Dr Wende Crease [phone]) is been informed regarding pt vital signs:     i just want to inform you that pt diastoli bp is 20-30's and map 40-50.   pt bp left arm is 106/23 (43)  right arm is 111/39 (57).   pt denies cp or distress noted.

## 2014-06-29 NOTE — Progress Notes (Signed)
Vascular and Interventional Radiology Progress Note    Reason for Encounter: POD#1 following TIPS    Subjective: Patient drowsy this morning, A&0x4. Breathing improved. No significant complaints.    Vitals:   Temperature:  [96.5 F (35.8 C)-98.3 F (36.8 C)] 98 F (36.7 C) (11/19 0419)  Blood pressure (BP): (106-135)/(23-78) 119/45 mmHg (11/19 0600)  Heart Rate:  [60-85] 74 (11/19 0600)  Respirations:  [13-23] 15 (11/19 0600)  Pain Score:  [-] 0 (11/19 0419)  O2 Device:  [-] Nasal cannula (11/19 0419)  O2 Flow Rate (L/min):  [2 l/min-6 l/min] 3 l/min (11/19 0419)  SpO2:  [93 %-98 %] 97 % (11/19 0600)     Pertinent Physical Exam:   A&O x4, no asterixis.    Labs:  CBC  Recent Labs      06/28/14   1732  06/29/14   0455   WBC  5.7  9.7   HGB  10.6*  12.1   HCT  29.4*  33.2*   PLT  72*  83*        Chemistry  Recent Labs      06/28/14   0518  06/29/14   0455   NA  128*  131*   K  4.6  4.6   CL  91*  93*   BICARB  25  24   BUN  24*  30*   CREAT  0.89  1.14*   GLU  107  156*   [place]  8.5  8.9   MG  2.1  2.2   PHOS  3.4  5.2*     Recent Labs      06/28/14   0518  06/29/14   0455   ALK  130  122   AST  38*  63*   ALT  32  45*   TBILI  1.68*  2.98*   DBILI  0.6*  0.9*   ALB  3.0*  2.7*       Assessment and Plan/Recommendations:  POD#1 following TIPS for hepatic hydrothorax. Patient fatigued this morning but, laying flat and breathing comfortably with nasal canula. Increase in Tbili from 1.68 to 2.98. Also slight elevation in Cr from ~0.9 to 1.14.   - Continue to trend T.bili to peak  - monitor for encephalopathy  - ultrasound to assess TIPS patency in 1 month, IR clinic to follow.

## 2014-06-29 NOTE — Plan of Care (Signed)
Problem: Pain - Acute  Goal: Control of acute pain  Outcome: Met  Pain levels assessed, pt stated that pain is not present at this time. Pt educated and instructed regarding pain management. Pt also instructed to report to nurse increased levels of pain. Pt verbalized understanding to instructions. Will monitor and medicate for pain prn    Problem: Breathing Pattern - Ineffective  Goal: Respiratory rate, rhythm and depth return to baseline  Outcome: Met  No s/s of respiratory distress, O2 at 3L/m NC with 100% O2 sat     Problem: Mobility - Impaired  Goal: Able to ambulate within specified parameters  Outcome: Met  Able to ambulate to BR with 1 person assist. No SOB noted.     Problem: Falls, Risk of  Goal: Keep patient free from falls utilizing universal fall precautions  Outcome: Met  Pt educated on fall risk and not to attempt out of bed activity without nursing staff present. Pt compliant thus far. Non skid footwear in place, pt placed close to nurses station, comfort rounds performed q1h and PRN, bed exit alarm activated. No falls thus far in shift. Will continue to monitor. Family members at bedside most of times     Problem: Discharge Planning  Goal: Participation in care planning  Outcome: Met  Discussed plans/goals for the day with family and patient. No active discharge planning for clinical improvement. Continue to monitor     Problem: Fluid Volume Excess  Goal: Absence of edema; peripheral and/or pulmonary  Outcome: Met  No s/s of fluid overload noted     Problem: Alteration in Blood Glucose  Goal: Glucose level within specified parameters  Outcome: Not Met  BS has been 170-280s today. Completed 1 can of nephro breakfast and lunch, ate less than 50% of meals. Will continue to monitor

## 2014-06-29 NOTE — Plan of Care (Signed)
Problem: Pain - Acute  Goal: Control of acute pain  Outcome: Met  The patient  denied pain. Will reassess pain level and document result. Will continue to monitor.        Problem: Breathing Pattern - Ineffective  Goal: Respiratory rate, rhythm and depth return to baseline  Outcome: Met  No signs of shortness of breath or respiratory distress. Pt currently on 2L NC with saturations >92%. Will continue to monitor patient.        Problem: Mobility - Impaired  Goal: Able to ambulate within specified parameters  Outcome: Met  Pt up to bathroom with one person assist. Will monitor the pt closely.          Problem: Falls, Risk of  Goal: Keep patient free from falls utilizing universal fall precautions  Outcome: Met  Assessment complete and no change from previous Fall Assessment. Pt at high risk for falls. Pt is AOx3. Bed is the lowest position and brakes are locked into place. Bed alarm on and call light within reach. Will monitor the pt closely.        Problem: Discharge Planning  Goal: Participation in care planning  Outcome: Met  The patient AOx3 and able to participate in care. No discharge instructions/ plans at this time.        Problem: Fluid Volume Excess  Goal: Absence of edema; peripheral and/or pulmonary  Outcome: Met  No presence of edema at this time. Will continue to monitor.        Problem: Alteration in Blood Glucose  Goal: Glucose level within specified parameters  Outcome: Not Met  Last blood sugar check was 245. The patient was given insulin per sliding scale. No signs or symptoms of hyperglycemia or hypoglycemia. Will continue to assess.

## 2014-06-29 NOTE — Progress Notes (Signed)
Daily Progress Note:  06/21/2014     Current Hospital Stay:   9 days - Admitted on: 06/20/2014    Subjective:  Reports much improvement in her breathing. Reports some nausea related to anesthesia. S/p TIPS yesterday evening with drop in gradient from 22-->7.     Objective:    Vital Signs:  Temperature:  [96.5 F (35.8 C)-98.3 F (36.8 C)] 98.1 F (36.7 C) (11/19 0800)  Blood pressure (BP): (106-135)/(23-78) 119/39 mmHg (11/19 1000)  Heart Rate:  [60-85] 70 (11/19 1000)  Respirations:  [13-23] 13 (11/19 1000)  Pain Score:  [-] 0 (11/19 1000)  O2 Device:  [-] Nasal cannula (11/19 1000)  O2 Flow Rate (L/min):  [2 l/min-6 l/min] 3 l/min (11/19 1000)  SpO2:  [93 %-100 %] 100 % (11/19 1000)    Wt Readings from Last 1 Encounters:   06/29/14 51.5 kg (113 lb 8.6 oz)       Intake/Output (Current Shift):  11/19 0600 - 11/19 1759  In: 14 [P.O.:390; I.V.:55]  Out: 57 [Urine:50]      Physical Exam:  GEN: elderly, spanish speaking, thin female in NAD. Cooperative, responding appropriately.   HEENT: NCAT, neck supple, EOMI, OP clear   CV: rrr  Pulmonary: improved aeration, especially over RUL   Gastrointestinal: +NABS, soft, nt   Neuro: no asterixis     Laboratory data:   Lab Results   Component Value Date    NA 131* 06/29/2014    K 4.6 06/29/2014    CL 93* 06/29/2014    BICARB 24 06/29/2014    BUN 30* 06/29/2014    CREAT 1.14* 06/29/2014    GLU 156* 06/29/2014    [place] 8.9 06/29/2014     Lab Results   Component Value Date    WBC 9.7 06/29/2014    HGB 12.1 06/29/2014    HCT 33.2* 06/29/2014    PLT 83* 06/29/2014    SEG 88* 06/29/2014    LYMPHS 6* 06/29/2014    MONOS 6 06/29/2014    EOS 1 06/29/2014     Lab Results   Component Value Date    AST 63* 06/29/2014    ALT 45* 06/29/2014    ALK 122 06/29/2014    TBILI 2.98* 06/29/2014    DBILI 0.9* 06/29/2014    TP 6.3 06/29/2014    ALB 2.7* 06/29/2014     CT a/p 11/17 prelim   1. 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  Additional extension to the splenic vein.   Hepatic veins are patent.  2. Sequelae of portal hypertension including splenomegaly, paraesophageal,   rectal, perisplenic, and perivertebral varices. Moderate ascites.  3. No reportable LI-RADS lesions.  4. 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  2) Mild LV diastolic dysfunction suggesting normal or low left atrial  pressure.  3) Small pericardial effusion without evidence of tamponade.  4) Compared to previous study on 10/16/2011, no significant change.  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     #. 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   -decrease lasix to 40 mg IV, aldactone 100 mg   -Strict I/O  -Low Na diet   -Nutrition consulted for education on low salt diet, appreciate recs     #. Hep C Cirrhosis: s/p treatment of Hep C with SVR achieved   Ascites-s/p TIPS. diuretics per above   HE-lactulose started last night, continue TID, rifaximin   EV-sm distal EV 5/13: propanolol dc'd at last admission 2/2 bradycardia   Continue cipro for SBP ppx   HCC: CT done 06/27/14     #. DM: on lantus and glipizide as outpatient. BG poorly controlled on admission.  -Cont lantus, lispro qac, SSI. Diabetes team consulted, appreciate recs.    FEN:  low sodium diet, cont cal count  PPx: SCDs, Hold chemoppx given thrombocytopenia  FC/FC     Dispo: Patient is currently medically unstable as evidenced by her high risk for developing hepatic  encephalopathy given >50% drop in pressure gradient after TIPS, rise in Cr and LFTs over the last 24 hours, and continued dyspnea which requires careful titration of her diuretics. Patient is therefore not medically stable for transfer to [facility] at this time. Will re-evaluate stability for transfer daily.

## 2014-06-29 NOTE — Plan of Care (Signed)
Problem: Pain - Acute  Goal: Control of acute pain  Outcome: Met  Vital signs stable. No complain of pain at this time. Will monitor pt.     Problem: Breathing Pattern - Ineffective  Goal: Arterial blood gas values and/or saturation return to baseline  Outcome: Met    Problem: Mobility - Impaired  Goal: Able to ambulate within specified parameters  Outcome: Not Met  Pt is been lying in bed after the procedure.     Problem: Falls, Risk of  Goal: Keep patient free from falls utilizing universal fall precautions  Outcome: Met  Pt and family are instructed to call for nurse when needing assistance. Pt and family also instructed to not attempt to get oob unassisted d/t risk for falls. Family verbalized understanding to instructions. Fall precautions in place. Bed in lowest and locked position, side rails upx3, bed alarm on, call light within reach. Will monitor.          Problem: Discharge Planning  Goal: Participation in care planning  Outcome: Not Met  No plan for d/c at this year.     Problem: Fluid Volume Excess  Goal: Absence of edema; peripheral and/or pulmonary  Outcome: Met  BS are clear throughout. Pt on 3L nc sats >92%.  No edema noted.  Goal: Vital signs within specified parameters  Outcome: Met  Goal: Balanced intake and output  Outcome: Met    Problem: Alteration in Blood Glucose  Goal: Glucose level within specified parameters  Outcome: Met  BG is 135. No insulin given as ordered. Will monitor pt.

## 2014-06-29 NOTE — Interdisciplinary (Signed)
Case manager spoke with the attending/Dr. Wende Crease. This patient is not stable for transfer at this time.   Case manager informed  MULTICULTURAL CM JUDY [phone]) patient not ready for transfer. Also discussed that the patient does not want to transfer to a [facility] facility. Patient is in a capitated plan and they will issue a denial if patient is stable for transfer and they refuse.   Case manager spoke with the son at the bedside about transfer to a [facility] facility when the patient is medically stable for transfer. They are resistant, refusing transfer. Explained when patient is stable for transfer she must go to an in-Network hospital for their insurance plan to continue to pay. Patient has the option to disenroll from the [facility] and choose an [facility] provider but this would not be in effect until 07/11/14 if done soon.   Case manager will continue to follow.

## 2014-06-29 NOTE — Progress Notes (Signed)
Endocrine/Diabetes Progress Note    Overnight: no acute events     Subjective:  Feeling ok, eating well today    Objective:  Temperature:  [96.5 F (35.8 C)-98.6 F (37 C)] 98.6 F (37 C) (11/19 1946)  Blood pressure (BP): (106-130)/(23-54) 130/41 mmHg (11/19 1946)  Heart Rate:  [61-75] 73 (11/19 1946)  Respirations:  [12-23] 16 (11/19 1946)  Pain Score:  [-] 0 (11/19 1000)  O2 Device:  [-] Nasal cannula (11/19 1800)  O2 Flow Rate (L/min):  [2 l/min-4 l/min] 3 l/min (11/19 1800)  SpO2:  [93 %-100 %] 100 % (11/19 1946)  Body mass index is 21.46 kg/(m^2).    GEN: NAD  PULM: breathing comfortably    Diet: CHO Limited + Glucerna tid    Blood Sugars reviewed:      AM Lu Di HS O/N      11.15.15 147 273 123 84  Lantus   26  Lispro  8+4 10+8+6  10     11.16.15 108 291 139 223  Lantus   26  Lispro  5+6 8+7 6+5 +4    11.17.15 129 195 117 127  Lantus   26  Lispro  10+6 +4 10+6    11.18  115 151 90/61 135  Lantus   26  Lispro   +3    11.19  172 283 344  Lantus   26  Lispro  6+3 6+8 6+10    A/P:  Paula Manning is a 73 year old female w/ uncontrolled DM2 c/b neuropathy as well as HepC cirrhosis, HTN admitted 06/20/2014 w/ 5 days SOB and found to have R pleural effusion thought 2/2 dietary indiscretion.     BG high and trending up today, mostly drinking supplement today.  Will re-evaluate timing of supplement w/ BG check and increase nutritional coverage in am if needed.    Inpatient Recs:   - Continue lantus 26 units daily before lunch  - Cont 10 units ac tid, RN to give 0, 1/2, whole dose based on % tray consumed  - Cont Lispro 6 units tid AC, "FOR Glucerna COVERAGE, do not give if pt not drinking supplement."  - Cont High correction scale w lispro ac/hs    Outpatient Recs/Education:    1. Self-monitor blood glucose: has meter covered per insurance and strips    2. Healthy Eating: reviewed cho groups with family and pt, nurse to show Emmi videos    3. Activity:per MD    4. Hypoglycemia: need to review    5. Medication:  likely modify dosages, but same, oral and insulin.    6. Discharge regimen:TBD, likely Lantus + orals    7. Follow-up: f/u w/ PCP Dr. Rayburn Felt.    Will cont to follow

## 2014-06-29 NOTE — Progress Notes (Signed)
INPATIENT HEPATOLOGY/GASTROENTEROLOGY PROGRESS NOTE   Date:  06/29/2014   Author: [facility] Stay:   9 days - Admitted on: 06/20/2014    Overnight Events/Subjective:    --s/p TIPS PSG 22=>7, no signs of immediate confusion  --thora performed with removal of 2L  --resp status feels improved but remains on 3L NC   --diuretics decreased to lasix 40/spironolactone 149m    Medications:   calcium carbonate  600 mg BID    cholecalciferol  2,000 Units Daily    ciprofloxacin  500 mg Q24H    famotidine  20 mg Daily    furosemide  40 mg Daily    insulin glargine  26 Units Daily before lunch    insulin lispro  1-10 Units 4x Daily WC    insulin lispro  10 Units TID WC    insulin lispro  6 Units TID WC    lactulose  20 g TID    multivitamin with minerals  1 tablet Daily    rifaximin  550 mg Q12H    sodium chloride (PF)  3 mL Q8H    spironolactone  100 mg Daily       Objective:    Vital Signs:  Temperature:  [96.5 F (35.8 C)-98.4 F (36.9 C)] 98.4 F (36.9 C) (11/19 1600)  Blood pressure (BP): (106-135)/(23-54) 124/43 mmHg (11/19 1500)  Heart Rate:  [60-75] 75 (11/19 1600)  Respirations:  [12-23] 18 (11/19 1600)  Pain Score:  [-] 0 (11/19 1000)  O2 Device:  [-] Nasal cannula (11/19 1500)  O2 Flow Rate (L/min):  [2 l/min-6 l/min] 3 l/min (11/19 1500)  SpO2:  [93 %-100 %] 99 % (11/19 1600)    Intake/Output (Current Shift):  11/18 0600 - 11/19 0559  In: 1135 [P.O.:340; I.V.:795]  Out: 2900 [Urine:900]    Physical Exam:  General: NAD  HEENT: No scleral icterus,NC in place, trachea midline, mucus membranes moist.  Lungs: Decreased BS on R side up to mid/upper lungs  Cardiovascular: Irregular  Abdomen: Distended with mild fluid wave  Extremities: Edema present bilaterally  Skin: Non-icteric and no visible rash  Neuro: No asterixis    Laboratory data:   Recent Labs      06/27/14   0635  06/28/14   0518  06/29/14   0455   NA  130*  128*  131*   K  4.2  4.6  4.6   CL  91*  91*  93*   BICARB  _0 BUN  22*  24*  30*   CREAT  0.82  0.89  1.14*   [place]  8.9  8.5  8.9   MG   --   2.1  2.2   PHOS   --   3.4  5.2*   TP  7.6  6.8  6.3   ALB  3.2*  3.0*  2.7*   TBILI  2.12*  1.68*  2.98*   DBILI   --   0.6*  0.9*   AST  40*  38*  63*   ALT  34*  32  45*   ALK  143*  130  122       Recent Labs      06/27/14   0635  06/28/14   0517  06/28/14   1732  06/29/14   0455   WBC  4.9  4.9  5.7  9.7   HGB  12.2  11.7  10.6*  12.1   HCT  33.4*  32.4*  29.4*  33.2*   MCV  90.3  90.3  90.2  89.7   PLT  57*  49*  72*  83*   SEG  71  74*   --   88*   LYMPHS  14*  13*   --   6*   MONOS  13*  11   --   6   EOS  2  2   --   1       Recent Labs      06/28/14   0517  06/28/14   1732  06/29/14   0455   PTT   --   29.0   --    INR  1.5  1.6  1.6       Imaging/Studies:  ABD U/S  IMPRESSION:  1. Large right pleural effusion.    2. Cirrhotic liver with sequela of portal hypertension including ascites and   splenomegaly.    3. 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 The   right and left portal veins remain patent with flow in the proper direction.    4. 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    TTE 11/16  Conclusions:  1) LV is small and has normal systolic function.  2) Mild LV diastolic dysfunction suggesting normal or low left atrial  pressure.  3) Small pericardial effusion without evidence of tamponade.  4) Compared to previous study on 10/16/2011, no significant change.     CT abd/pelvis 11/17  IMPRESSION:  CT scan of the abdomen and pelvis with and without IV contrast    1. 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 Additional extension to the splenic vein.   Hepatic veins are patent.    2. Sequelae of portal hypertension including splenomegaly, paraesophageal,   rectal, perisplenic, and  perivertebral varices. Moderate ascites.    3. No reportable LI-RADS lesions.    4. 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     #R pleural effusion: Repeat thoracentesis c/w hydrothorax. No signs of infection or new onset CHF at this time. Seems to be recurreing despite diuresis and salt restriction.  S/p thora on 11/13 and 11/16 now w/ small PTX    --s/p TIPS 11/18 PSG 22 => 7  --decreasing diuretics given rise in Cr and placement of TIPS.  On lasix 45m/spironlactone 1021m Need to mont Cr after slight increase today.    --wean O2 as tolerated  --strict IOs, daily wts  --low Na diet, nutrition c/s for low-salt recommendations      #HCV GT2 cirrhosis with SVR c/b EV/ascites/hydrothorax: MELD 15. Relative stable disease.  TTE late positive bubble for shunt.     - low salt diet, diuresis with IV lasix 4048m spironolactone 100m44mAs noted above, will be able to wean diuretics in coming weeks to months after TIPS placement.   - mont  mental status closely given large gradient drop  - strict I/Os + daily wts    *EV: 05/2013 grade I EV. BB stopped in setting of bradycardia, TIPS placed for HHT and should decompress EV.  *PVT: Repeat CT demonstrating non-obstructive thrombus in main PV/L PV with extension into splenic vein.  No anticoagulation at this time and nonobstructive flows during TIPS placement/  *Ascites: No SBP on dx para 11/10. Cont cipro ppx. On IV lasix + spironolactone. Low salt diet  *HE: On rifaximin and started on lactulose, titrate to 3-4 BM's per day  *HCC: Imaging from 06/2014 without HCC. Serial imaging for IPMN    DWA  [place]    Harle Stanford, MD  [facility]

## 2014-06-30 LAB — CBC WITH DIFF, BLOOD
ANC-Automated: 8.5 10*3/uL — ABNORMAL HIGH (ref 1.6–7.0)
Abs Eosinophils: 0.2 10*3/uL (ref 0.1–0.7)
Abs Lymphs: 0.7 10*3/uL — ABNORMAL LOW (ref 0.8–3.1)
Abs Monos: 1.4 10*3/uL — ABNORMAL HIGH (ref 0.2–0.8)
EOSINOPHILS: 1 % (ref 1–4)
HGB: 11.6 gm/dL (ref 11.2–15.7)
Hct: 31.5 % — ABNORMAL LOW (ref 34.0–45.0)
Imm Gran %: 1 % (ref <1)
Imm Gran Abs: 0.1 10*3/uL (ref <0.1)
LYMPHOCYTES: 6 % — ABNORMAL LOW (ref 19–53)
MCH: 33 pg — ABNORMAL HIGH (ref 26.0–32.0)
MCHC: 36.8 % — ABNORMAL HIGH (ref 32.0–36.0)
MCV: 89.5 um3 (ref 79.0–95.0)
MONOCYTES: 13 % — ABNORMAL HIGH (ref 5–12)
MPV: 9.9 fL (ref 9.4–12.4)
Plt Count: 66 10*3/uL — ABNORMAL LOW (ref 140–370)
Plt Est: DECREASED
RBC: 3.52 10*6/uL — ABNORMAL LOW (ref 3.90–5.20)
RDW: 15.5 % — ABNORMAL HIGH (ref 12.0–14.0)
Segs: 78 % — ABNORMAL HIGH (ref 34–71)
WBC: 10.9 10*3/uL — ABNORMAL HIGH (ref 4.0–10.0)

## 2014-06-30 LAB — GLUCOSE (POCT)
GLUCOSE (POCT): 348 mg/dL — ABNORMAL HIGH (ref 70–115)
GLUCOSE (POCT): 358 mg/dL — ABNORMAL HIGH (ref 70–115)
GLUCOSE (POCT): 354 mg/dL — ABNORMAL HIGH (ref 70–115)
GLUCOSE (POCT): 438 mg/dL — ABNORMAL HIGH (ref 70–115)
GLUCOSE (POCT): 480 mg/dL — ABNORMAL HIGH (ref 70–115)
GLUCOSE (POCT): 439 mg/dL — ABNORMAL HIGH (ref 70–115)
GLUCOSE (POCT): 363 mg/dL — ABNORMAL HIGH (ref 70–115)
GLUCOSE (POCT): 239 mg/dL — ABNORMAL HIGH (ref 70–115)
Glucose (POCT): 205 mg/dL — ABNORMAL HIGH (ref 70–115)

## 2014-06-30 LAB — LIVER PANEL, BLOOD
ALBUMIN: 2.5 g/dL — ABNORMAL LOW (ref 3.5–5.2)
ALKALINE PHOS: 117 U/L (ref 35–140)
ALT (SGPT): 67 U/L — ABNORMAL HIGH (ref 0–33)
AST (SGOT): 75 U/L — ABNORMAL HIGH (ref 0–32)
BILIRUBIN, DIR: 0.9 mg/dL — ABNORMAL HIGH (ref <0.2)
BILIRUBIN, TOT: 2.31 mg/dL — ABNORMAL HIGH (ref <1.20)
TOTAL PROTEIN: 5.8 g/dL — ABNORMAL LOW (ref 6.0–8.0)

## 2014-06-30 LAB — BASIC METABOLIC PANEL, BLOOD
ANION GAP: 13 mmol/L (ref 7–15)
BUN: 43 mg/dL — ABNORMAL HIGH (ref 6–20)
Bicarbonate: 24 mmol/L (ref 22–29)
CALCIUM: 8.7 mg/dL (ref 8.5–10.6)
CHLORIDE: 95 mmol/L — ABNORMAL LOW (ref 98–107)
Creatinine: 1.39 mg/dL — ABNORMAL HIGH (ref 0.51–0.95)
GFR: 37 mL/min
Glucose: 223 mg/dL — ABNORMAL HIGH (ref 70–115)
Potassium: 3.7 mmol/L (ref 3.5–5.1)
Sodium: 132 mmol/L — ABNORMAL LOW (ref 136–145)

## 2014-06-30 LAB — PROTHROMBIN TIME, BLOOD
INR: 1.6
PT,PATIENT: 17.2 s — ABNORMAL HIGH (ref 9.7–12.5)

## 2014-06-30 LAB — PHOSPHORUS, BLOOD: PHOSPHOROUS: 4.6 mg/dL — ABNORMAL HIGH (ref 2.7–4.5)

## 2014-06-30 LAB — MAGNESIUM, BLOOD: MAGNESIUM: 2.4 mg/dL (ref 1.7–2.6)

## 2014-06-30 MED ORDER — LACTULOSE 10 GM/15ML OR SOLN
20.0000 g | Freq: Two times a day (BID) | ORAL | Status: DC
Start: 2014-06-30 — End: 2014-07-03
  Administered 2014-06-30 – 2014-07-02 (×5): 20 g via ORAL
  Filled 2014-06-30 (×5): qty 30

## 2014-06-30 MED ORDER — INSULIN LISPRO (HUMAN) 100 UNIT/ML SC SOLN (CUSTOM)
7.0000 [IU] | Freq: Three times a day (TID) | INTRAMUSCULAR | Status: DC
Start: 2014-06-30 — End: 2014-07-01
  Administered 2014-06-30: 19:00:00 7 [IU] via SUBCUTANEOUS
  Filled 2014-06-30: qty 7

## 2014-06-30 MED ORDER — INSULIN LISPRO (HUMAN) 100 UNIT/ML SC SOLN (CUSTOM)
7.0000 [IU] | Freq: Once | INTRAMUSCULAR | Status: AC
Start: 2014-06-30 — End: 2014-06-30
  Administered 2014-06-30: 15:00:00 7 [IU] via SUBCUTANEOUS
  Filled 2014-06-30 (×2): qty 7

## 2014-06-30 MED ORDER — ALBUMIN HUMAN 25 % IV SOLN
25.0000 g | Freq: Two times a day (BID) | INTRAVENOUS | Status: DC
Start: 2014-07-01 — End: 2014-06-30

## 2014-06-30 MED ORDER — INSULIN LISPRO (HUMAN) 100 UNIT/ML SC SOLN (CUSTOM)
14.0000 [IU] | Freq: Three times a day (TID) | INTRAMUSCULAR | Status: DC
Start: 2014-06-30 — End: 2014-07-01
  Administered 2014-06-30: 19:00:00 14 [IU] via SUBCUTANEOUS
  Filled 2014-06-30: qty 14

## 2014-06-30 MED ORDER — ALBUMIN HUMAN 25 % IV SOLN
25.0000 g | Freq: Two times a day (BID) | INTRAVENOUS | Status: AC
Start: 2014-06-30 — End: 2014-07-01
  Administered 2014-06-30 – 2014-07-01 (×3): 25 g via INTRAVENOUS
  Filled 2014-06-30 (×3): qty 100

## 2014-06-30 MED ORDER — ONDANSETRON HCL 4 MG/2ML IV SOLN
4.0000 mg | Freq: Four times a day (QID) | INTRAMUSCULAR | Status: DC
Start: 2014-06-30 — End: 2014-07-04
  Administered 2014-07-01 – 2014-07-04 (×13): 4 mg via INTRAVENOUS
  Filled 2014-06-30 (×13): qty 2

## 2014-06-30 MED ORDER — ALBUMIN HUMAN 25 % IV SOLN
25.0000 g | Freq: Once | INTRAVENOUS | Status: AC
Start: 2014-06-30 — End: 2014-06-30
  Administered 2014-06-30: 25 g via INTRAVENOUS
  Filled 2014-06-30: qty 100

## 2014-06-30 NOTE — Progress Notes (Signed)
Vascular  Interventional Radiology Progress Note    Vascular and Interventional Radiology Progress Note    Reason for Encounter: POD#2 following TIPS    Subjective: Breathing improved. Reports 7 BM yesterday. Still experiencing nausea and abdominal pain. No significant complaints.      Physical Exam:  BP 121/46 mmHg   Pulse 82   Temp(Src) 97.6 F (36.4 C)   Resp 12   Ht _0  (1.549 m)   Wt 50.3 kg (110 lb 14.3 oz)   BMI 20.96 kg/m2   SpO2 96%  11/19 0600 - 11/20 0559  In: 888 [P.O.:830; I.V.:58]  Out: 550 [Urine:450]  GEN: NAD, A&O x4  Lungs: Decreased right basilar BS  Skin: Non-icteric and no visible rash  Abdomen: Distended with mild fluid wave  Neuro: No asterixis    Labs:  CBC  Recent Labs      06/29/14   0455  06/30/14   0541   WBC  9.7  10.9*   HGB  12.1  11.6   HCT  33.2*  31.5*   PLT  83*  66*   SEG  88*  78*   LYMPHS  6*  6*   MONOS  6  13*     Chemistry  Recent Labs      06/29/14   0455  06/30/14   0541   NA  131*  132*   K  4.6  3.7   CL  93*  95*   BICARB  24  24   BUN  30*  43*   CREAT  1.14*  1.39*   GLU  156*  223*   [place]  8.9  8.7   MG  2.2  2.4   PHOS  5.2*  4.6*       Recent Labs      06/29/14   0455  06/30/14   0541   ALK  122  117   AST  63*  75*   ALT  45*  67*   TBILI  2.98*  2.31*   DBILI  0.9*  0.9*   ALB  2.7*  2.5*       Coags  Recent Labs      06/28/14   1732  06/29/14   0455  06/30/14   0541   PT  17.5*  17.0*  17.2*   PTT  29.0   --    --    INR  1.6  1.6  1.6       Imaging:     CXR 06/29/14  IMPRESSION:  Interval decrease in size of right pleural effusion with improved expansion of  the right upper lung. Persistent right basal atelectasis versus consolidation  and moderate right pleural effusion.    Assessment and Plan/Recommendations:  POD#2 following TIPS for hepatic hydrothorax.  Increase in Tbili up to 2.98 (peak) now trending down to 2.31.  Also slight elevation in Cr from 1.14 to 1.39.   - Continue to trend T.bili to peak  - monitor for encephalopathy  - ultrasound to assess TIPS  patency in 1 month, IR clinic to follow.        Lavada Mesi, DNP, FNP-C, AGACNP-BC  Vascular Interventional Radiology  PID [id number]  pgr [phone]

## 2014-06-30 NOTE — Interdisciplinary (Signed)
Text paged MD regarding [facility] will not accept pt as blood sugar is too high.  Awaiting response.

## 2014-06-30 NOTE — Plan of Care (Signed)
Problem: Pain - Acute  Goal: Control of acute pain  Outcome: Met  Pt does not complain of pain.  Pt states she has gas and bloating from lactulose.  Will continue to monitor.    Problem: Breathing Pattern - Ineffective  Goal: Respiratory rate, rhythm and depth return to baseline  Outcome: Met  Patient breathing pattern unlabored and even, saturation 92% or greater on room air. Will continue to assess patient and intervene as needed.        Problem: Mobility - Impaired  Goal: Able to ambulate within specified parameters  Outcome: Met  Pt able to walk in room with one person standby assist.  Will continue to monitor.    Problem: Falls, Risk of  Goal: Keep patient free from falls utilizing universal fall precautions  Outcome: Met  Patient bed at lowest setting with side rails up x2.  Bed brakes and alarm on.  Pt call light and bedside table within reach.  Encouraged to call for assistance when needed. Patient verbalizes understanding.  Non-skid socks on.  Gait steady with standby assist.  Family at bedside at all times.  Patient free from falls. Will continue to monitor.    Problem: Discharge Planning  Goal: Participation in care planning  Outcome: Met  No orders placed for discharge.  Patient still needs medical stability and clearance.  Will continue to monitor.        Problem: Fluid Volume Excess  Goal: Absence of edema; peripheral and/or pulmonary  Outcome: Met  Pt lungs are clear to diminished and pt is without edema.  Will continue to monitor.    Problem: Alteration in Blood Glucose  Goal: Glucose level within specified parameters  Outcome: Not Met  MD notified of BSFS when pt is above 300.  Insulin has been modified.  Will continue to monitor for s/s of hypo and hyperglycemia.

## 2014-06-30 NOTE — Interdisciplinary (Signed)
Report given to [facility]. Updated on pt's POC, assessment, meds. No s/s of distress. Endorsed BSFS to be done at 1934 to re-check after insulin was given per MD request and to hold transfer until BS is under control. Care has been resumed.

## 2014-06-30 NOTE — Interdisciplinary (Signed)
Report given to Apolonio Schneiders, Therapist, sports. POC discussed. RN aware that MD was paged to d/c v/s q2h because patient will be moving to 1128, no response from MD yet. Endorsed care.

## 2014-06-30 NOTE — Interdisciplinary (Signed)
Text paged MD per protocol regarding pt diastolic between 92Z-30Q.  Pt asymptomatic.  Will continue to monitor.

## 2014-06-30 NOTE — Progress Notes (Signed)
INPATIENT HEPATOLOGY/GASTROENTEROLOGY PROGRESS NOTE   Date:  06/30/2014   Author: [facility] Stay:   10 days - Admitted on: 06/20/2014    Overnight Events/Subjective:    --c/o excess BMs after starting lactulose    Medications:   albumin human  25 g BID    calcium carbonate  600 mg BID    cholecalciferol  2,000 Units Daily    ciprofloxacin  500 mg Q24H    famotidine  20 mg Daily    insulin glargine  26 Units Daily before lunch    insulin lispro  1-10 Units 4x Daily WC    insulin lispro  10 Units TID WC    insulin lispro  6 Units TID WC    lactulose  20 g BID    multivitamin with minerals  1 tablet Daily    ondansetron  4 mg Q6H    rifaximin  550 mg Q12H    sodium chloride (PF)  3 mL Q8H       Objective:    Vital Signs:  Temperature:  [97.6 F (36.4 C)-98.6 F (37 C)] 98.3 F (36.8 C) (11/20 0804)  Blood pressure (BP): (117-139)/(40-51) 125/50 mmHg (11/20 0804)  Heart Rate:  [71-78] 78 (11/20 0804)  Respirations:  [11-18] 17 (11/20 0804)  Pain Score:  [-] 0 (11/20 0804)  O2 Device:  [-] None (Room air) (11/20 0804)  O2 Flow Rate (L/min):  [1 l/min-3 l/min] 1 l/min (11/20 0655)  SpO2:  [93 %-100 %] 95 % (11/20 0804)    Intake/Output (Current Shift):  11/19 0600 - 11/20 0559  In: 846 [P.O.:830; I.V.:58]  Out: 550 [Urine:450]    Physical Exam:  General: NAD  HEENT: No scleral icterus,NC in place, trachea midline, mucus membranes moist.  Lungs: Decreased BS on R side up to mid/upper lungs  Cardiovascular: Irregular  Abdomen: Distended with mild fluid wave  Extremities: Edema present bilaterally  Skin: Non-icteric and no visible rash  Neuro: No asterixis    Laboratory data:   Recent Labs      06/28/14   0518  06/29/14   0455  06/30/14   0541   NA  128*  131*  132*   K  4.6  4.6  3.7   CL  91*  93*  95*   BICARB  25  24  24    BUN  24*  30*  43*   CREAT  0.89  1.14*  1.39*   [place]  8.5  8.9  8.7   MG  2.1  2.2  2.4   PHOS  3.4  5.2*  4.6*   TP  6.8  6.3  5.8*   ALB  3.0*  2.7*  2.5*    TBILI  1.68*  2.98*  2.31*   DBILI  0.6*  0.9*  0.9*   AST  38*  63*  75*   ALT  32  45*  67*   ALK  130  122  117       Recent Labs      06/28/14   0517  06/28/14   1732  06/29/14   0455  06/30/14   0541   WBC  4.9  5.7  9.7  10.9*   HGB  11.7  10.6*  12.1  11.6   HCT  32.4*  29.4*  33.2*  31.5*   MCV  90.3  90.2  89.7  89.5   PLT  49*  72*  83*  66*   SEG  74*   --   88*  78*   LYMPHS  13*   --   6*  6*   MONOS  11   --   6  13*   EOS  2   --   1  1       Recent Labs      06/28/14   1732  06/29/14   0455  06/30/14   0541   PTT  29.0   --    --    INR  1.6  1.6  1.6       Imaging/Studies:  ABD U/S  IMPRESSION:  1. Large right pleural effusion.    2. Cirrhotic liver with sequela of portal hypertension including ascites and   splenomegaly.    3. 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 The   right and left portal veins remain patent with flow in the proper direction.    4. 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    TTE 11/16  Conclusions:  1) LV is small and has normal systolic function.  2) Mild LV diastolic dysfunction suggesting normal or low left atrial  pressure.  3) Small pericardial effusion without evidence of tamponade.  4) Compared to previous study on 10/16/2011, no significant change.     CT abd/pelvis 11/17  IMPRESSION:  CT scan of the abdomen and pelvis with and without IV contrast    1. 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 Additional extension to the splenic vein.   Hepatic veins are patent.    2. Sequelae of portal hypertension including splenomegaly, paraesophageal,   rectal, perisplenic, and perivertebral varices. Moderate ascites.    3. No reportable LI-RADS lesions.    4. No other changes since prior MR abdomen, including the multiple pancreatic    cysts likely representing multiple IPMNs.    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     #R pleural effusion: Repeat thoracentesis c/w hydrothorax. No signs of infection or new onset CHF at this time. Seems to be recurreing despite diuresis and salt restriction.  S/p thora on 11/13 and 11/16 now w/ small PTX    --s/p TIPS 11/18 PSG 22 => 7  --holding diuretics given rise in Cr  --wean O2 as tolerated  --low Na diet, nutrition c/s for low-salt recommendations    #AKI: Likely related to fluid shifts from thoracentesis and diuretics    --holding diuretics  --strict IOs, daily wts  --replete albumin today with 25gm 25% BID  --if cont rise, would repeat UA/ulytes     #HCV GT2 cirrhosis with SVR c/b EV/ascites/hydrothorax: MELD 15. Relative stable disease.  TTE late positive bubble for shunt.     - low salt diet, holding diuretics given rise in Cr  - mont mental status closely given large gradient drop  - strict I/Os + daily wts  - decreasing lactulose for 3-4BMs per day    *EV: 05/2013 grade I EV. BB stopped in setting of bradycardia, TIPS placed for HHT and  should decompress EV.  *PVT: Repeat CT demonstrating non-obstructive thrombus in main PV/L PV with extension into splenic vein.  No anticoagulation at this time and nonobstructive flows during TIPS placement.   *Ascites: No SBP on dx para 11/10. Cont cipro ppx. Holding diuretics. Low salt diet  *HE: On rifaximin and lactulose, titrate to 3-4 BM's per day  *HCC: Imaging from 06/2014 without HCC. Serial imaging for IPMN    DWA [place]    Harle Stanford, MD  [facility]

## 2014-06-30 NOTE — Progress Notes (Signed)
Daily Progress Note:  06/21/2014     Current Hospital Stay:   10 days - Admitted on: 06/20/2014    Subjective:  Dyspnea improved. Reports persistent nausea and generalized abdominal discomfort.  Had 7 BM yesterday.     Objective:    Vital Signs:  Temperature:  [97.6 °F (36.4 °C)-98.6 °F (37 °C)] 98.3 °F (36.8 °C) (11/20 0804)  Blood pressure (BP): (117-139)/(40-51) 125/50 mmHg (11/20 0804)  Heart Rate:  [71-78] 78 (11/20 0804)  Respirations:  [11-18] 17 (11/20 0804)  Pain Score:  [-] 0 (11/20 0804)  O2 Device:  [-] None (Room air) (11/20 0804)  O2 Flow Rate (L/min):  [1 l/min-3 l/min] 1 l/min (11/20 0655)  SpO2:  [93 %-100 %] 95 % (11/20 0804)    Wt Readings from Last 1 Encounters:   06/30/14 50.3 kg (110 lb 14.3 oz)       Intake/Output (Current Shift):  11/20 0600 - 11/20 1759  In: 220 [P.O.:120; I.V.:100]  Out: -       Physical Exam:  GEN: elderly, spanish speaking, thin female in NAD. Cooperative, responding appropriately.   HEENT: NCAT, neck supple, EOMI, OP clear   CV: rrr  Pulmonary: improved aeration, especially over RUL   Gastrointestinal: +NABS, soft, nt   Neuro: no asterixis     Laboratory data:   Lab Results   Component Value Date    NA 132* 06/30/2014    K 3.7 06/30/2014    CL 95* 06/30/2014    BICARB 24 06/30/2014    BUN 43* 06/30/2014    CREAT 1.39* 06/30/2014    GLU 223* 06/30/2014    CA 8.7 06/30/2014     Lab Results   Component Value Date    WBC 10.9* 06/30/2014    HGB 11.6 06/30/2014    HCT 31.5* 06/30/2014    PLT 66* 06/30/2014    SEG 88* 06/29/2014    LYMPHS 6* 06/29/2014    MONOS 6 06/29/2014    EOS 1 06/29/2014     Lab Results   Component Value Date    AST 75* 06/30/2014    ALT 67* 06/30/2014    ALK 117 06/30/2014    TBILI 2.31* 06/30/2014    DBILI 0.9* 06/30/2014    TP 5.8* 06/30/2014    ALB 2.5* 06/30/2014     CT a/p 11/17   1. 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 Additional extension to the splenic vein.   Hepatic veins  are patent.  2. Sequelae of portal hypertension including splenomegaly, paraesophageal,   rectal, perisplenic, and perivertebral varices. Moderate ascites.  3. No reportable LI-RADS lesions.  4. 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  2) Mild LV diastolic dysfunction suggesting normal or low left atrial  pressure.  3) Small pericardial effusion without evidence of tamponade.  4) Compared to previous study on 10/16/2011, no significant change.  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       Assessment and Plan:  Paula Manning is a 72 year old spanish speaking only female with PMHx of HepC cirrhosis s/p treatment with achievement of SVR, DM, HTN who presents to the ED for shortness of breath.     #AKI:   Cr 0.8-->1.39 over the last 48 hours.   -hold diuretics   -will give albumin     #. 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 Hep C Cirrhosis: s/p treatment of Hep C with SVR achieved   Ascites-s/p TIPS. Holding diuretics given AKI   HE-lactulose BID (titrated to 3-4BM), rifaximin   EV-sm distal EV 5/13: propanolol dc'd at last admission 2/2 bradycardia   Continue cipro for SBP ppx   HCC: CT done 06/27/14     #. DM: on lantus and glipizide as outpatient. BG poorly controlled on admission.  -Cont lantus, lispro qac, SSI. Diabetes team consulted, appreciate recs.    FEN:  low sodium diet, cont cal count  PPx: SCDs, Hold chemoppx given thrombocytopenia  FC/FC     Dispo: Patient is currently medically unstable  as evidenced by her high risk for developing hepatic encephalopathy given >50% drop in pressure gradient after TIPS, rise in Cr and LFTs over the last 48 hours, and continued dyspnea which requires careful titration of her diuretics. Patient is therefore not medically stable for transfer to [facility] at this time. Will re-evaluate stability for transfer daily.

## 2014-06-30 NOTE — Interdisciplinary (Signed)
Md stated to cover with sliding scale as pt lantus should be peaking soon.  MD then paged 30 minutes after 10 units of sliding was given as BSFS 438.  Awaiting response.

## 2014-06-30 NOTE — Progress Notes (Signed)
Endocrine/Diabetes Progress Note    Overnight: no acute events, remains afebrile w/ WBC trending up a bit and BG suddenly up x2 days, glucerna drank right before lunch today    Subjective:  Still w/ nausea and abd discomfort, eating a bit, mostly supplements    Objective:  Temperature:  [97.6 F (36.4 C)-98.6 F (37 C)] 97.6 F (36.4 C) (11/20 1153)  Blood pressure (BP): (117-139)/(34-51) 121/46 mmHg (11/20 1200)  Heart Rate:  [71-86] 82 (11/20 1200)  Respirations:  [11-18] 12 (11/20 1200)  Pain Score:  [-] 0 (11/20 0804)  O2 Device:  [-] None (Room air) (11/20 0804)  O2 Flow Rate (L/min):  [1 l/min-3 l/min] 1 l/min (11/20 0655)  SpO2:  [92 %-100 %] 96 % (11/20 1200)  Body mass index is 20.96 kg/(m^2).    GEN: NAD  PULM: breathing comfortably    Diet: CHO Limited + Glucerna tid    Blood Sugars reviewed:      AM Lu Di HS O/N      11.15.15 147 273 123 84  Lantus   26  Lispro  8+4 10+8+6  10     11.16.15 108 291 139 223  Lantus   26  Lispro  5+6 8+7 6+5 +4    11.17.15 129 195 117 127  Lantus   26  Lispro  10+6 +4 10+6    11.18  115 151 90/61 135  Lantus   26  Lispro   +3    11.19  172 283 344 245  Lantus   26  Lispro  6+3 6+8 6+10 +5    11.20  205 358  Lantus   26  Lispro  5+3 +10    A/P:  Paula Manning is a 73 year old female w/ uncontrolled DM2 c/b neuropathy as well as HepC cirrhosis, HTN admitted 06/20/2014 w/ 5 days SOB and found to have R pleural effusion thought 2/2 dietary indiscretion.     BG high and cont to trendup today.  Pt remains afebrile but WBC trending up, possibly underlying infection contributing.  Will increase nutritional insulin dosing today and work w/ pt and RN on trying to keep supplement as close to meals as possible and not drinking right before BG check.    Inpatient Recs:   - Continue lantus 26 units daily before lunch  - Inc 10-->14 units ac tid, RN to give 0, 1/2, whole dose based on % tray consumed  - Inc Lispro 6-->7 units tid AC, "FOR Glucerna COVERAGE, do not give if pt not  drinking supplement."  - Cont High correction scale w lispro ac/hs    Outpatient Recs/Education:    1. Self-monitor blood glucose: has meter covered per insurance and strips    2. Healthy Eating: reviewed cho groups with family and pt, nurse to show Emmi videos    3. Activity:per MD    4. Hypoglycemia: need to review    5. Medication: likely modify dosages, but same, oral and insulin.    6. Discharge regimen:TBD, likely Lantus + orals    7. Follow-up: f/u w/ PCP Dr. Rayburn Felt.    Will cont to follow

## 2014-06-30 NOTE — Interdisciplinary (Signed)
Report given to Courtney, RN and endorsed care. Patient in no apparent distress, vital signs stable. RN aware of the patient plan of care, the patient's condition, labs, and treatment.

## 2014-06-30 NOTE — Interdisciplinary (Signed)
Text paged MD regarding BSFS 348.  Will cover her per sliding scale.  Will continue to monitor.

## 2014-06-30 NOTE — Interdisciplinary (Signed)
Text paged MD regarding pt BSFS 480.  Awaiting response.

## 2014-07-01 LAB — BASIC METABOLIC PANEL, BLOOD
ANION GAP: 11 mmol/L (ref 7–15)
BUN: 45 mg/dL — ABNORMAL HIGH (ref 6–20)
Bicarbonate: 26 mmol/L (ref 22–29)
CALCIUM: 8.7 mg/dL (ref 8.5–10.6)
CHLORIDE: 93 mmol/L — ABNORMAL LOW (ref 98–107)
Creatinine: 1.27 mg/dL — ABNORMAL HIGH (ref 0.51–0.95)
GFR: 41 mL/min
Glucose: 192 mg/dL — ABNORMAL HIGH (ref 70–115)
Potassium: 4.4 mmol/L (ref 3.5–5.1)
Sodium: 130 mmol/L — ABNORMAL LOW (ref 136–145)

## 2014-07-01 LAB — GLUCOSE (POCT)
GLUCOSE (POCT): 230 mg/dL — ABNORMAL HIGH (ref 70–115)
GLUCOSE (POCT): 202 mg/dL — ABNORMAL HIGH (ref 70–115)
Glucose (POCT): 215 mg/dL — ABNORMAL HIGH (ref 70–115)
Glucose (POCT): 315 mg/dL — ABNORMAL HIGH (ref 70–115)
Glucose (POCT): 179 mg/dL — ABNORMAL HIGH (ref 70–115)

## 2014-07-01 LAB — VITAMIN A, BLOOD
Retinol (Vit A): 0.22 mg/L — ABNORMAL LOW (ref 0.30–1.20)
Retinyl Palmitate (Vit A): 0.04 mg/L (ref 0.00–0.10)

## 2014-07-01 LAB — MAGNESIUM, BLOOD: MAGNESIUM: 2.6 mg/dL (ref 1.7–2.6)

## 2014-07-01 LAB — CBC WITH DIFF, BLOOD
ANC-Automated: 5.6 10*3/uL (ref 1.6–7.0)
Abs Eosinophils: 0.1 10*3/uL (ref 0.1–0.7)
Abs Lymphs: 0.8 10*3/uL (ref 0.8–3.1)
Abs Monos: 0.9 10*3/uL — ABNORMAL HIGH (ref 0.2–0.8)
EOSINOPHILS: 1 % (ref 1–4)
HGB: 9.7 gm/dL — ABNORMAL LOW (ref 11.2–15.7)
Hct: 26.8 % — ABNORMAL LOW (ref 34.0–45.0)
LYMPHOCYTES: 11 % — ABNORMAL LOW (ref 19–53)
MCH: 32.4 pg — ABNORMAL HIGH (ref 26.0–32.0)
MCHC: 36.2 % — ABNORMAL HIGH (ref 32.0–36.0)
MCV: 89.6 um3 (ref 79.0–95.0)
MONOCYTES: 12 % (ref 5–12)
MPV: 9.7 fL (ref 9.4–12.4)
Plt Count: 47 10*3/uL — ABNORMAL LOW (ref 140–370)
RBC: 2.99 10*6/uL — ABNORMAL LOW (ref 3.90–5.20)
RDW: 15.3 % — ABNORMAL HIGH (ref 12.0–14.0)
Segs: 75 % — ABNORMAL HIGH (ref 34–71)
WBC: 7.4 10*3/uL (ref 4.0–10.0)

## 2014-07-01 LAB — PROTHROMBIN TIME, BLOOD
INR: 1.7
PT,PATIENT: 18.3 s — ABNORMAL HIGH (ref 9.7–12.5)

## 2014-07-01 LAB — LIVER PANEL, BLOOD
ALBUMIN: 3 g/dL — ABNORMAL LOW (ref 3.5–5.2)
ALKALINE PHOS: 91 U/L (ref 35–140)
ALT (SGPT): 44 U/L — ABNORMAL HIGH (ref 0–33)
AST (SGOT): 44 U/L — ABNORMAL HIGH (ref 0–32)
BILIRUBIN, DIR: 0.8 mg/dL — ABNORMAL HIGH (ref <0.2)
BILIRUBIN, TOT: 2.74 mg/dL — ABNORMAL HIGH (ref <1.20)
TOTAL PROTEIN: 5.8 g/dL — ABNORMAL LOW (ref 6.0–8.0)

## 2014-07-01 LAB — PHOSPHORUS, BLOOD: PHOSPHOROUS: 2.6 mg/dL — ABNORMAL LOW (ref 2.7–4.5)

## 2014-07-01 MED ORDER — INSULIN LISPRO (HUMAN) 100 UNIT/ML SC SOLN (CUSTOM)
18.0000 [IU] | Freq: Three times a day (TID) | INTRAMUSCULAR | Status: DC
Start: 2014-07-01 — End: 2014-07-04
  Administered 2014-07-01: 14:00:00 9 [IU] via SUBCUTANEOUS
  Filled 2014-07-01: qty 18
  Filled 2014-07-01: qty 9

## 2014-07-01 MED ORDER — INSULIN LISPRO (HUMAN) 100 UNIT/ML SC SOLN (CUSTOM)
18.0000 [IU] | Freq: Three times a day (TID) | INTRAMUSCULAR | Status: DC
Start: 2014-07-01 — End: 2014-07-01

## 2014-07-01 MED ORDER — INSULIN GLARGINE 100 UNIT/ML SC SOLN
30.0000 [IU] | Freq: Every day | SUBCUTANEOUS | Status: DC
Start: 2014-07-01 — End: 2014-07-04
  Administered 2014-07-01 – 2014-07-04 (×4): 30 [IU] via SUBCUTANEOUS
  Filled 2014-07-01 (×4): qty 30

## 2014-07-01 MED ORDER — INSULIN LISPRO (HUMAN) 100 UNIT/ML SC SOLN (CUSTOM)
9.0000 [IU] | Freq: Three times a day (TID) | INTRAMUSCULAR | Status: DC
Start: 2014-07-01 — End: 2014-07-01

## 2014-07-01 MED ORDER — INSULIN LISPRO (HUMAN) 100 UNIT/ML SC SOLN (CUSTOM)
9.0000 [IU] | Freq: Three times a day (TID) | INTRAMUSCULAR | Status: DC
Start: 2014-07-01 — End: 2014-07-03
  Administered 2014-07-01 – 2014-07-03 (×6): 9 [IU] via SUBCUTANEOUS
  Filled 2014-07-01 (×3): qty 9

## 2014-07-01 MED ORDER — POTASSIUM & SODIUM PHOSPHATES 280-160-250 MG OR PACK
1.0000 | PACK | Freq: Once | ORAL | Status: AC
Start: 2014-07-01 — End: 2014-07-01
  Administered 2014-07-01: 1 via ORAL
  Filled 2014-07-01: qty 1

## 2014-07-01 NOTE — Plan of Care (Signed)
Problem: Mobility - Impaired  Goal: Able to ambulate within specified parameters  Outcome: Met  Pt is able to ambulate with assistance of one person back and forth to the bathroom, sometimes uses the bedside commode with assistance

## 2014-07-01 NOTE — Plan of Care (Signed)
Problem: Pain - Acute  Goal: Control of acute pain  Outcome: Met  Rn instructed pt on use of 0-10 pain scale to rate any pain. Prn pain medications and their frequencies reviewed, pt verbalized understanding and has been able to correctly use the pain scale to communicate pain level.

## 2014-07-01 NOTE — Plan of Care (Signed)
Problem: Pain - Acute  Goal: Control of acute pain  Outcome: Met  Pt denies any pain. Will cont to monitor.     Problem: Mobility - Impaired  Goal: Able to ambulate within specified parameters  Outcome: Met  Pt gets in and out of bed with assist of family who is at bedside. Pt moves slowly with assist. Pt sits up in chair for her meals    Problem: Falls, Risk of  Goal: Keep patient free from falls utilizing universal fall precautions  Outcome: Met  Pt gets in and out of bed with assist of family who is at bedside. Pt moves slowly with assist. Pt sits up in chair for her meals. Pt and family instructed to call for assist as needed.     Problem: Discharge Planning  Goal: Participation in care planning  Outcome: Met  No plans for d/c today. Will cont with current plan of care.     Problem: Alteration in Blood Glucose  Goal: Glucose level within specified parameters  Outcome: Not Met  Pt BG 215 before breakfast and 315 before lunch. Pt appetite poor for breakfast so nutritional insulin not given, but drank all her glucerna so the insulin coverage for that given. For lunch pt ate fair, so 1/2 of her nutritional insulin given, did not have any glucerna.

## 2014-07-01 NOTE — Progress Notes (Signed)
ATTENDING PROGRESS NOTE ATTESTATION  Interval history:  Remains on room air  See fellow's history and physical for further details of the patient's history.    Objective  I have examined the patient and concur with the fellow's exam.  Labs were reviewed.    Assessment and Plan  I agree with the fellow's care plan.    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 Admitted with SOB and found to have a large right pleural effusion in the setting of increased sodium intake. Unable to diurese with lasix/spiro up to 80/200, had two thoras, very uncomfortable, says "drowning." TIPS done 06/28/14 with gradient 22-->7. Tolerated the procedure well  -- LFTs stable  -- lasix 80/200-->40/100 --> held for Cr rise. Likely overdiuresis + diarrhea. Giving albumin today. It is improving.   -- on room air with oxygen only on for comfort right now. Consider repeat CXR tomorrow. Would like to restart diuretics tomorrow if possible.   -- rifaximin 550 BID, lactulose for goal 3 BM per day  -- ensure TID, calorie count. Scheduled zofran    See the fellow's history and physical for further details.  [name] MD, PID [id number]

## 2014-07-01 NOTE — Plan of Care (Signed)
Problem: Discharge Planning  Goal: Participation in care planning  Outcome: Met  Plan of care reviewed with pt and her granddaughter at bedside tonight- scheduled and prn medications, blood sugar check at bedtime, pt to have blood drawn in am, vital signs every 4 hours, weight in am, strict observation of intake and output. Pt and her granddaughter verbalized understanding and willingness to participate in plan of care.

## 2014-07-01 NOTE — Progress Notes (Signed)
Daily Progress Note:  06/21/2014     Current Hospital Stay:   11 days - Admitted on: 06/20/2014    Subjective:  Reports nausea and abdominal pain improved. Sitting up in chair.     Objective:    Vital Signs:  Temperature:  [97.6 F (36.4 C)-98.3 F (36.8 C)] 98.1 F (36.7 C) (11/21 0732)  Blood pressure (BP): (107-126)/(35-47) 112/42 mmHg (11/21 0732)  Heart Rate:  [79-86] 80 (11/21 0732)  Respirations:  [12-22] 18 (11/21 0732)  Pain Score:  [-] 0 (11/21 0732)  O2 Device:  [-] None (Room air) (11/20 2000)  SpO2:  [92 %-97 %] 95 % (11/21 0732)    Wt Readings from Last 1 Encounters:   07/01/14 52.1 kg (114 lb 13.8 oz)       Intake/Output (Current Shift):  11/21 0600 - 11/21 1759  In: 587 [P.O.:487; I.V.:100]  Out: 300 [Urine:300]      Physical Exam:  GEN: elderly, spanish speaking, thin female in NAD, sitting up in chair.   HEENT: NCAT, neck supple, EOMI  CV: rrr  Pulmonary: CTAB   Gastrointestinal: +NABS, soft, nt   Neuro: no asterixis     Laboratory data:   Lab Results   Component Value Date    NA 130* 07/01/2014    K 4.4 07/01/2014    CL 93* 07/01/2014    BICARB 26 07/01/2014    BUN 45* 07/01/2014    CREAT 1.27* 07/01/2014    GLU 192* 07/01/2014    [place] 8.7 07/01/2014     Lab Results   Component Value Date    WBC 7.4 07/01/2014    HGB 9.7* 07/01/2014    HCT 26.8* 07/01/2014    PLT 47* 07/01/2014    SEG 75* 07/01/2014    LYMPHS 11* 07/01/2014    MONOS 12 07/01/2014    EOS 1 07/01/2014     Lab Results   Component Value Date    AST 44* 07/01/2014    ALT 44* 07/01/2014    ALK 91 07/01/2014    TBILI 2.74* 07/01/2014    DBILI 0.8* 07/01/2014    TP 5.8* 07/01/2014    ALB 3.0* 07/01/2014     CT a/p 11/17   1. 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 Additional extension to the splenic vein.   Hepatic veins are patent.  2. Sequelae of portal hypertension including splenomegaly, paraesophageal,   rectal, perisplenic, and perivertebral varices. Moderate  ascites.  3. No reportable LI-RADS lesions.  4. 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  2) Mild LV diastolic dysfunction suggesting normal or low left atrial  pressure.  3) Small pericardial effusion without evidence of tamponade.  4) Compared to previous study on 10/16/2011, no significant change.  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     #AKI: improving.   -continue hold diuretics, hope to resume in next few days   -albumin x1 today     #.  Sob 2/2 refractory hepatic hydrothorax in setting of dietary noncompliance: S/p thoracentesis in IR 11/13, 11/16 (with small post procedural PTX noted that has since resolved), 11/18 with TIPS done by IR 11/18  -Given rise in Cr, will continue to hold diuretics   -Strict I/O  -Low Na diet   -Nutrition consulted for education on low salt diet, appreciate recs     #. Hep C Cirrhosis: s/p treatment of Hep C with SVR achieved   Ascites-s/p TIPS. Holding diuretics given AKI   HE-lactulose (titrated to 3-4BM), rifaximin   EV-sm distal EV 5/13: propanolol dc'd at last admission 2/2 bradycardia   Continue cipro for SBP ppx   HCC: CT done 06/27/14     #. DM: on lantus and glipizide as outpatient. BG poorly controlled on admission.  -Cont lantus, lispro qac, SSI. Diabetes team consulted, appreciate recs.    FEN:  low sodium diet, cont cal count  PPx: SCDs, Hold chemoppx given thrombocytopenia  FC/FC

## 2014-07-01 NOTE — Plan of Care (Signed)
Problem: Discharge Planning  Goal: Participation in care planning  Outcome: Met  Pt was downgraded to med/surg level of care and transferred from [place] just before midnight. Pt is accompanied by her son, who has a recliner and will be staying with her tonight. Plan of care reviewed with pt - scheduled and prn medications reviewed, strict observation of intake and output, use of 0-10 pain scale to rate any pain, pt reminded to call for assistance by using call bell, pt to have blood drawn in am. Pt  And her son verbalized understanding and willingness to participate in plan of care.

## 2014-07-01 NOTE — Anesthesia Postprocedure Evaluation (Signed)
Anesthesia Transfer of Care Note    Patient: Paula Manning    Procedures performed: Procedure(s):  RAD REVISION OF TIPS OR DIPS STENT  RAD THORACENTESIS    Vital signs: stable           Anesthesia Post Note    Patient: Paula Manning    Procedure(s) Performed: Procedure(s):  RAD REVISION OF TIPS OR DIPS STENT  RAD THORACENTESIS      Final anesthesia type: No value filed.    Patient location: PACU    Post anesthesia pain: adequate analgesia    Post assessment: no apparent anesthetic complications, tolerated procedure well and no evidence of recall    Mental status: awake, alert  and oriented    Airway Patent: Yes    Last Vitals:   Filed Vitals:    07/01/14 0732   BP: 112/42   Pulse: 80   Temp: 36.7 C   Resp: 18   SpO2: 95%       Post vital signs: stable    Hydration: adequate    N/V:no    Anesthetic complications: no    Disposal of controlled substances: All controlled substances during the case accounted for and disposed of per hospital policy    Plan of care per primary team.

## 2014-07-01 NOTE — Progress Notes (Signed)
Endocrine/Diabetes Progress Note    Overnight: no acute events    Objective:  Temperature:  [97.8 F (36.6 C)-98.3 F (36.8 C)] 98.1 F (36.7 C) (11/21 0732)  Blood pressure (BP): (107-126)/(35-47) 112/42 mmHg (11/21 0732)  Heart Rate:  [79-84] 80 (11/21 0732)  Respirations:  [13-20] 18 (11/21 0732)  Pain Score:  [-] 0 (11/21 0732)  O2 Device:  [-] None (Room air) (11/21 1454)  SpO2:  [92 %-97 %] 95 % (11/21 1454)  Body mass index is 21.71 kg/(m^2).    Diet: CHO Limited + Glucerna tid    Blood Sugars reviewed:      AM Lu Di  HS O/N      11.15.15 147 273 123  84  Lantus   26  Lispro  8+4 10+8+6  10     11.16.15 108 291 139  223  Lantus   26  Lispro  5+6 8+7 6+5  +4    11.17.15 129 195 117  127  Lantus   26  Lispro  10+6 +4 10+6    11.18  115 151 90/61  135  Lantus   26  Lispro   +3    11.19  172 283 344  245  Lantus   26  Lispro  6+3 6+8 6+10  +5    11.20  205 358 438  239  Lantus   26  Lispro  5+3 7+10 14+7+10 7    11.21  215 315  Lantus   30  Lispro  9+5 9+10    A/P:  Paula Manning is a 73 year old female w/ uncontrolled DM2 c/b neuropathy as well as HepC cirrhosis, HTN admitted 06/20/2014 w/ 5 days SOB and found to have R pleural effusion thought 2/2 dietary indiscretion.     Inpatient Recs:   - Increase Lantus to 30 units daily before lunch  - Increase Lispro to 18 units TID with meals, RN to give 0, 1/2, whole dose based on % tray consumed  - Increase Lispro to 9 units TID AC, "FOR Glucerna COVERAGE, do not give if pt not drinking supplement."  - Cont High correction scale w lispro ac/hs    Outpatient Recs/Education:    1. Self-monitor blood glucose: has meter covered per insurance and strips    2. Healthy Eating: reviewed cho groups with family and pt, nurse to show Emmi videos    3. Activity:per MD    4. Hypoglycemia: need to review    5. Medication: likely modify dosages, but same, oral and insulin.    6. Discharge regimen:TBD, likely Lantus + orals    7. Follow-up: f/u w/ PCP Dr. Rayburn Felt.    Will  cont to follow    DWA Dr. Karma Lew    Signed,  Shanon Brow  Endocrine Fellow  07/01/2014

## 2014-07-01 NOTE — Plan of Care (Signed)
Problem: Breathing Pattern - Ineffective  Goal: Respiratory rate, rhythm and depth return to baseline  Outcome: Met  Pt's oxygen saturation while at rest in bed with 2 liter per minute nasal canula has been 95%.

## 2014-07-01 NOTE — Progress Notes (Signed)
INPATIENT HEPATOLOGY/GASTROENTEROLOGY PROGRESS NOTE   Date:  07/01/2014   Author: [facility] Stay:   11 days - Admitted on: 06/20/2014    Overnight Events/Subjective:    --intermittent O2 for comfort only   --UOP adequate while holding diuretics     Medications:   albumin human  25 g BID    calcium carbonate  600 mg BID    cholecalciferol  2,000 Units Daily    ciprofloxacin  500 mg Q24H    famotidine  20 mg Daily    insulin glargine  26 Units Daily before lunch    insulin lispro  1-10 Units 4x Daily WC    insulin lispro  14 Units TID WC    insulin lispro  7 Units TID WC    lactulose  20 g BID    multivitamin with minerals  1 tablet Daily    ondansetron  4 mg Q6H    rifaximin  550 mg Q12H    sodium chloride (PF)  3 mL Q8H       Objective:    Vital Signs:  Temperature:  [97.6 F (36.4 C)-98.3 F (36.8 C)] 98.1 F (36.7 C) (11/21 0732)  Blood pressure (BP): (107-126)/(34-50) 112/42 mmHg (11/21 0732)  Heart Rate:  [78-86] 80 (11/21 0732)  Respirations:  [12-22] 18 (11/21 0732)  Pain Score:  [-] 0 (11/21 0732)  O2 Device:  [-] None (Room air) (11/20 2000)  SpO2:  [92 %-97 %] 95 % (11/21 0732)    Intake/Output (Current Shift):  11/20 0600 - 11/21 0559  In: 1212 [P.O.:1009; I.V.:203]  Out: 625 [Urine:625]    Physical Exam:  General: NAD  HEENT: No scleral icterus,NC in place, trachea midline, mucus membranes moist.  Lungs: Decreased BS on R side up to mid/upper lungs  Cardiovascular: Irregular  Abdomen: Distended with mild fluid wave  Extremities: Edema present bilaterally  Skin: Non-icteric and no visible rash  Neuro: No asterixis    Laboratory data:   Recent Labs      06/29/14   0455  06/30/14   0541   NA  131*  132*   K  4.6  3.7   CL  93*  95*   BICARB  24  24   BUN  30*  43*   CREAT  1.14*  1.39*   [place]  8.9  8.7   MG  2.2  2.4   PHOS  5.2*  4.6*   TP  6.3  5.8*   ALB  2.7*  2.5*   TBILI  2.98*  2.31*   DBILI  0.9*  0.9*   AST  63*  75*   ALT  45*  67*   ALK  122  117       Recent  Labs      06/28/14   1732  06/29/14   0455  06/30/14   0541   WBC  5.7  9.7  10.9*   HGB  10.6*  12.1  11.6   HCT  29.4*  33.2*  31.5*   MCV  90.2  89.7  89.5   PLT  72*  83*  66*   SEG   --   88*  78*   LYMPHS   --   6*  6*   MONOS   --   6  13*   EOS   --   1  1       Recent Labs  06/28/14   1732  06/29/14   0455  06/30/14   0541   PTT  29.0   --    --    INR  1.6  1.6  1.6       Imaging/Studies:  ABD U/S  IMPRESSION:  1. Large right pleural effusion.    2. Cirrhotic liver with sequela of portal hypertension including ascites and   splenomegaly.    3. 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 The   right and left portal veins remain patent with flow in the proper direction.    4. 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    TTE 11/16  Conclusions:  1) LV is small and has normal systolic function.  2) Mild LV diastolic dysfunction suggesting normal or low left atrial  pressure.  3) Small pericardial effusion without evidence of tamponade.  4) Compared to previous study on 10/16/2011, no significant change.     CT abd/pelvis 11/17  IMPRESSION:  CT scan of the abdomen and pelvis with and without IV contrast    1. 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 Additional extension to the splenic vein.   Hepatic veins are patent.    2. Sequelae of portal hypertension including splenomegaly, paraesophageal,   rectal, perisplenic, and perivertebral varices. Moderate ascites.    3. No reportable LI-RADS lesions.    4. 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     #R pleural effusion: Repeat thoracentesis c/w hydrothorax. No signs of infection or new onset CHF at this time. Seems to be recurreing despite diuresis and salt restriction.  S/p thora on 11/13 and 11/16 now w/ small PTX    --s/p TIPS 11/18 PSG 22 => 7  --holding diuretics given rise in Cr, may restart tomorrow at lower dose if cont to downtrend  --wean O2 as tolerated  --low Na diet, nutrition c/s for low-salt recommendations and calorie counts    #AKI: Likely related to fluid shifts from thoracentesis and diuretics    --holding diuretics given rise in Cr, may restart tomorrow at lower dose if cont to downtrend  --strict IOs, daily wts  --on albumin with 25gm 25% BID additional dose then discontinuing d/t albumin > 3    #HCV GT2 cirrhosis with SVR c/b EV/ascites/hydrothorax: MELD 15. Relative stable disease.  TTE late positive bubble for shunt.     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 BB stopped in setting of bradycardia, TIPS placed for HHT and should decompress EV.  *PVT: Repeat CT demonstrating non-obstructive thrombus in main PV/L PV with extension into splenic vein.  No anticoagulation at this time and nonobstructive flows during TIPS placement.   *  Ascites: No SBP on dx para 11/10. Cont cipro ppx. Holding diuretics. Low salt diet  *HE: On rifaximin and lactulose, titrate to 3-4 BM's per day  *HCC: Imaging from 06/2014 without HCC. Serial imaging for IPMN    DWA [place]    Harle Stanford, MD  [facility]

## 2014-07-02 LAB — CBC WITH DIFF, BLOOD
ANC-Automated: 4.6 10*3/uL (ref 1.6–7.0)
Abs Eosinophils: 0.1 10*3/uL (ref 0.1–0.7)
Abs Lymphs: 0.6 10*3/uL — ABNORMAL LOW (ref 0.8–3.1)
Abs Monos: 0.7 10*3/uL (ref 0.2–0.8)
Eosinophils: 2 % (ref 1–4)
HGB: 9.5 gm/dL — ABNORMAL LOW (ref 11.2–15.7)
Hct: 26.9 % — ABNORMAL LOW (ref 34.0–45.0)
LYMPHOCYTES: 11 % — ABNORMAL LOW (ref 19–53)
MCH: 32 pg (ref 26.0–32.0)
MCHC: 35.3 % (ref 32.0–36.0)
MCV: 90.6 um3 (ref 79.0–95.0)
MPV: 10.1 fL (ref 9.4–12.4)
Monocytes: 11 % (ref 5–12)
Plt Count: 49 10*3/uL — ABNORMAL LOW (ref 140–370)
RBC: 2.97 10*6/uL — ABNORMAL LOW (ref 3.90–5.20)
RDW: 15.4 % — ABNORMAL HIGH (ref 12.0–14.0)
Segs: 76 % — ABNORMAL HIGH (ref 34–71)
WBC: 6 10*3/uL (ref 4.0–10.0)

## 2014-07-02 LAB — PROTHROMBIN TIME, BLOOD
INR: 1.6
PT,PATIENT: 17.8 s — ABNORMAL HIGH (ref 9.7–12.5)

## 2014-07-02 LAB — BASIC METABOLIC PANEL, BLOOD
ANION GAP: 12 mmol/L (ref 7–15)
BUN: 44 mg/dL — ABNORMAL HIGH (ref 6–20)
Bicarbonate: 24 mmol/L (ref 22–29)
CALCIUM: 9 mg/dL (ref 8.5–10.6)
CHLORIDE: 91 mmol/L — ABNORMAL LOW (ref 98–107)
Creatinine: 1.22 mg/dL — ABNORMAL HIGH (ref 0.51–0.95)
GFR: 43 mL/min
Glucose: 158 mg/dL — ABNORMAL HIGH (ref 70–115)
Potassium: 4.3 mmol/L (ref 3.5–5.1)
Sodium: 127 mmol/L — ABNORMAL LOW (ref 136–145)

## 2014-07-02 LAB — LIVER PANEL, BLOOD
ALBUMIN: 3.8 g/dL (ref 3.5–5.2)
ALKALINE PHOS: 87 U/L (ref 35–140)
ALT (SGPT): 36 U/L — ABNORMAL HIGH (ref 0–33)
AST (SGOT): 37 U/L — ABNORMAL HIGH (ref 0–32)
BILIRUBIN, DIR: 0.8 mg/dL — ABNORMAL HIGH (ref <0.2)
BILIRUBIN, TOT: 3.23 mg/dL — ABNORMAL HIGH (ref <1.20)
TOTAL PROTEIN: 6 g/dL (ref 6.0–8.0)

## 2014-07-02 LAB — GLUCOSE (POCT)
GLUCOSE (POCT): 171 mg/dL — ABNORMAL HIGH (ref 70–115)
GLUCOSE (POCT): 221 mg/dL — ABNORMAL HIGH (ref 70–115)
GLUCOSE (POCT): 214 mg/dL — ABNORMAL HIGH (ref 70–115)
Glucose (POCT): 209 mg/dL — ABNORMAL HIGH (ref 70–115)

## 2014-07-02 LAB — PHOSPHORUS, BLOOD: PHOSPHOROUS: 2.7 mg/dL (ref 2.7–4.5)

## 2014-07-02 LAB — MAGNESIUM, BLOOD: MAGNESIUM: 2.8 mg/dL — ABNORMAL HIGH (ref 1.7–2.6)

## 2014-07-02 MED ORDER — FUROSEMIDE 10 MG/ML IJ SOLN
20.0000 mg | Freq: Every day | INTRAMUSCULAR | Status: DC
Start: 2014-07-02 — End: 2014-07-04
  Administered 2014-07-02 – 2014-07-04 (×4): 20 mg via INTRAVENOUS
  Filled 2014-07-02 (×3): qty 4

## 2014-07-02 MED ORDER — SPIRONOLACTONE 25 MG OR TABS
50.0000 mg | ORAL_TABLET | Freq: Every day | ORAL | Status: DC
Start: 2014-07-02 — End: 2014-07-04
  Administered 2014-07-02 – 2014-07-04 (×3): 50 mg via ORAL
  Filled 2014-07-02 (×3): qty 2

## 2014-07-02 MED ORDER — FUROSEMIDE 20 MG OR TABS
20.0000 mg | ORAL_TABLET | Freq: Once | ORAL | Status: DC
Start: 2014-07-02 — End: 2014-07-02

## 2014-07-02 NOTE — Plan of Care (Signed)
Problem: Breathing Pattern - Ineffective  Goal: Respiratory rate, rhythm and depth return to baseline  Outcome: Not Met  Pt's nasal canula was removed while pt was in bed, oxygen saturation went down to 87% on room air. Pt reported she fellt short of breath, nasal canula replaced and turned up to 3 liters. Pt reports feeling better with higher oxygen flow at this time.

## 2014-07-02 NOTE — Progress Notes (Signed)
Daily Progress Note:  06/21/2014     Current Hospital Stay:   12 days - Admitted on: 06/20/2014    Subjective:  One episode of desaturation to 88% overnight. Ambulating the hallways with assistance.     Objective:    Vital Signs:  Temperature:  [97.3 F (36.3 C)-98.6 F (37 C)] 98.2 F (36.8 C) (11/22 0727)  Blood pressure (BP): (105-120)/(39-48) 109/39 mmHg (11/22 1153)  Heart Rate:  [74-81] 77 (11/22 1153)  Respirations:  [16] 16 (11/22 0727)  Pain Score:  [-] Patient Sleeping, Respiratory Assessment Done (11/22 0727)  O2 Device:  [-] Nasal cannula (11/22 0101)  O2 Flow Rate (L/min):  [2 l/min-3 l/min] 3 l/min (11/22 0101)  SpO2:  [88 %-99 %] 96 % (11/22 0727)    Wt Readings from Last 1 Encounters:   07/02/14 51.6 kg (113 lb 12.1 oz)       Intake/Output (Current Shift):  11/22 0600 - 11/22 1759  In: 337 [P.O.:337]  Out: -       Physical Exam:  GEN: elderly, spanish speaking, thin female   HEENT: NCAT, neck supple, EOMI  CV: rrr  Pulmonary: decreased BS R base   Gastrointestinal: +NABS, soft, nt   Neuro: no asterixis     Laboratory data:   Lab Results   Component Value Date    NA 127* 07/02/2014    K 4.3 07/02/2014    CL 91* 07/02/2014    BICARB 24 07/02/2014    BUN 44* 07/02/2014    CREAT 1.22* 07/02/2014    GLU 158* 07/02/2014    [place] 9.0 07/02/2014     Lab Results   Component Value Date    WBC 6.0 07/02/2014    HGB 9.5* 07/02/2014    HCT 26.9* 07/02/2014    PLT 49* 07/02/2014    SEG 76* 07/02/2014    LYMPHS 11* 07/02/2014    MONOS 11 07/02/2014    EOS 2 07/02/2014     Lab Results   Component Value Date    AST 37* 07/02/2014    ALT 36* 07/02/2014    ALK 87 07/02/2014    TBILI 3.23* 07/02/2014    DBILI 0.8* 07/02/2014    TP 6.0 07/02/2014    ALB 3.8 07/02/2014     CT a/p 11/17   1. 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 Additional extension to the splenic vein.   Hepatic veins are patent.  2. Sequelae of portal hypertension including  splenomegaly, paraesophageal,   rectal, perisplenic, and perivertebral varices. Moderate ascites.  3. No reportable LI-RADS lesions.  4. 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  2) Mild LV diastolic dysfunction suggesting normal or low left atrial  pressure.  3) Small pericardial effusion without evidence of tamponade.  4) Compared to previous study on 10/16/2011, no significant change.  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     CXR 11/22  Increasing partially loculated large right effusion with underlying pacities   in the right lung. Otherwise the cardiac silhouette and mediastinal contours   are unchanged. Vascular calcifications noted. No new dense airspace   consolidations of the left lung noted. No definite pneumothorax seen. No other  change from prior  study.    Assessment and Plan:  Kamaljit Hizer is a 73 year old spanish speaking only female with PMHx of HepC cirrhosis s/p treatment with achievement of SVR, DM, HTN who presents to the ED for shortness of breath.     #. Sob 2/2 refractory hepatic hydrothorax in setting of dietary noncompliance: S/p thoracentesis in IR 11/13, 11/16 (with small post procedural PTX noted that has since resolved), 11/18 with TIPS done by IR 11/18  -Cr improving, given desaturation, worsening CXR, and mild dyspnea, will restart diuretics today   -Strict I/O  -Low Na diet   -Nutrition + ensure supplements     #AKI: improving.   -restart diuretics today at low dose: lasix 20/aldactone 50 mg     #. Hep C Cirrhosis: s/p treatment of Hep C with SVR achieved   Ascites-s/p TIPS. Diuretics per above   HE-lactulose (titrated to 3-4BM), rifaximin   EV-sm distal EV 5/13: propanolol dc'd at last admission 2/2 bradycardia   Continue cipro for SBP ppx   HCC: CT done 06/27/14     #. DM: on  lantus and glipizide as outpatient. BG poorly controlled on admission.  -Cont lantus, lispro qac, SSI. Diabetes team consulted, appreciate recs.    FEN:  low sodium diet diabetic mechanical soft diet (dentures lost in transit), cont cal count  PPx: SCDs, Hold chemoppx given thrombocytopenia  FC/FC

## 2014-07-02 NOTE — Progress Notes (Signed)
INPATIENT HEPATOLOGY/GASTROENTEROLOGY PROGRESS NOTE   Date:  07/02/2014   Author: [facility] Stay:   12 days - Admitted on: 06/20/2014    Overnight Events/Subjective:    --intermittent O2 up to 3L with episode of desats overnight and back to RA later in the day  --UOP adequate while holding diuretics   --no more nausea after scheduled anti-emetics    Medications:   calcium carbonate  600 mg BID    cholecalciferol  2,000 Units Daily    ciprofloxacin  500 mg Q24H    famotidine  20 mg Daily    insulin glargine  30 Units Daily before lunch    insulin lispro  1-10 Units 4x Daily WC    insulin lispro  18 Units TID WC    insulin lispro  9 Units TID WC    lactulose  20 g BID    multivitamin with minerals  1 tablet Daily    ondansetron  4 mg Q6H    rifaximin  550 mg Q12H    sodium chloride (PF)  3 mL Q8H       Objective:    Vital Signs:  Temperature:  [97.3 F (36.3 C)-98.6 F (37 C)] 98.2 F (36.8 C) (11/22 0727)  Blood pressure (BP): (105-120)/(42-48) 105/48 mmHg (11/22 0727)  Heart Rate:  [74-81] 75 (11/22 0727)  Respirations:  [16] 16 (11/22 0727)  Pain Score:  [-] Patient Sleeping, Respiratory Assessment Done (11/22 0727)  O2 Device:  [-] Nasal cannula (11/22 0101)  O2 Flow Rate (L/min):  [2 l/min-3 l/min] 3 l/min (11/22 0101)  SpO2:  [88 %-99 %] 96 % (11/22 0727)    Intake/Output (Current Shift):  11/21 0600 - 11/22 0559  In: 660 [P.O.:637; I.V.:100]  Out: 600 [Urine:600]    Physical Exam:  General: NAD  HEENT: No scleral icterus,NC in place, trachea midline, mucus membranes moist.  Lungs: Decreased BS on R side up to mid/upper lungs  Cardiovascular: Irregular  Abdomen: Distended with mild fluid wave  Extremities: Edema present bilaterally  Skin: Non-icteric and no visible rash  Neuro: No asterixis    Laboratory data:   Recent Labs      06/30/14   0541  07/01/14   0651   NA  132*  130*   K  3.7  4.4   CL  95*  93*   BICARB  24  26   BUN  43*  45*   CREAT  1.39*  1.27*   [place]  8.7   8.7   MG  2.4  2.6   PHOS  4.6*  2.6*   TP  5.8*  5.8*   ALB  2.5*  3.0*   TBILI  2.31*  2.74*   DBILI  0.9*  0.8*   AST  75*  44*   ALT  67*  44*   ALK  117  91       Recent Labs      06/30/14   0541  07/01/14   0651   WBC  10.9*  7.4   HGB  11.6  9.7*   HCT  31.5*  26.8*   MCV  89.5  89.6   PLT  66*  47*   SEG  78*  75*   LYMPHS  6*  11*   MONOS  13*  12   EOS  1  1       Recent Labs      06/30/14  6606  07/01/14   0651   INR  1.6  1.7       Imaging/Studies:  ABD U/S  IMPRESSION:  1. Large right pleural effusion.    2. Cirrhotic liver with sequela of portal hypertension including ascites and   splenomegaly.    3. 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 The   right and left portal veins remain patent with flow in the proper direction.    4. 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    TTE 11/16  Conclusions:  1) LV is small and has normal systolic function.  2) Mild LV diastolic dysfunction suggesting normal or low left atrial  pressure.  3) Small pericardial effusion without evidence of tamponade.  4) Compared to previous study on 10/16/2011, no significant change.     CT abd/pelvis 11/17  IMPRESSION:  CT scan of the abdomen and pelvis with and without IV contrast    1. 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 Additional extension to the splenic vein.   Hepatic veins are patent.    2. Sequelae of portal hypertension including splenomegaly, paraesophageal,   rectal, perisplenic, and perivertebral varices. Moderate ascites.    3. No reportable LI-RADS lesions.    4. 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    Assessment/Plan:   73 yo hispanic female HCV GT 2 cirrhosis (achieved SVR) fairly well  compensated. 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     #R pleural effusion: Repeat thoracentesis c/w hydrothorax. No signs of infection or new onset CHF at this time. Seems to be recurreing despite diuresis and salt restriction.  S/p thora on 11/13 and 11/16.  Small post-thora PTX, now resolved.  CXR worsening today.    --s/p TIPS 11/18 PSG 22 => 7  --given worsening CXR, will restart gentle diuretics with lasix 77m/spiro 582m --wean O2 as tolerated  --low Na diet, nutrition c/s for calorie counts    #AKI: Likely related to fluid shifts from thoracentesis and diuretics    --attempting gentle diuresis as noted above  --strict IOs, daily wts  --albumin discontinued, add back if albumin < 3    #HCV GT2 cirrhosis with SVR c/b EV/ascites/hydrothorax: MELD 15. Relative stable disease.  TTE late positive bubble for shunt.     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 BB stopped in setting of bradycardia, TIPS placed for HHT and should decompress EV.  *PVT: Repeat CT demonstrating non-obstructive thrombus in main PV/L PV with extension into splenic vein.  No anticoagulation at this time and nonobstructive flows during TIPS placement.   *Ascites: No SBP on dx para 11/10. Cont cipro ppx. Holding diuretics. Low salt diet  *HE: On rifaximin and lactulose, titrate to 3-4 BM's per day  *HCC: Imaging from 06/2014 without HCC. Serial imaging for IPMN    DWA  [place]    Harle Stanford, MD  [facility]

## 2014-07-02 NOTE — Progress Notes (Signed)
ATTENDING PROGRESS NOTE ATTESTATION  Interval history:  desat overnight  Walking this morning in the hallway. Actually feels stable.   See fellow's history and physical for further details of the patient's history.    Objective  I have examined the patient and concur with the fellow's exam.  Labs were reviewed.  Some worsening gon CXR  Cr stable to a bit improved    Assessment and Plan  I agree with the fellow's care plan.    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 Admitted with SOB and found to have a large right pleural effusion in the setting of increased sodium intake. Unable to diurese with lasix/spiro up to 80/200, had two thoras, very uncomfortable, says "drowning." TIPS done 06/28/14 with gradient 22-->7. Tolerated the procedure well  -- bili up to 3, otherwise stable.   -- lasix 80/200-->40/100 --> held for Cr rise. Likely overdiuresis + diarrhea. Giving albumin BID. Slowly improving. Given pulm status will try gentle diuretics with 20/50 today and evaluate response  -- rifaximin 550 BID, lactulose for goal 3 BM per day --> only one BM yesterday but had 3. Told her to make sure nurse aware of all her BMs.    -- ensure TID, calorie count. Scheduled zofran    See the fellow's history and physical for further details.  [name], [place] [id number]

## 2014-07-02 NOTE — Plan of Care (Signed)
Problem: Alteration in Blood Glucose  Goal: Glucose level within specified parameters  Outcome: Met  Pt's blood sugar was checked before bedtime was 202, did not require any insulin coverage. Please see glycemic flowsheet for trends.

## 2014-07-02 NOTE — Interdisciplinary (Signed)
Clinical Nutrition Calorie Count Results:   Day 2 , Data from 07/01/14    Estimated needs per RD: Rev. Estimated Nutritional needs: [place]. jeor (55kg) 1003 x 1.2-1.5  1204-1504kcals (22-27kcals/kg)   REE Per [place](53.6 Kg): 64-80 g protein/day (1.2-1.5g/kg). Maintenance Fluids: deferred to MD/ on diuretics    PO diet: Nutritional Supplement Glucerna  Diet Custom: 2 Gram Sodium  PO intake: 551 kcals, 26grams protein (2 meals recorded + 1 supplements)     45.7% of low end energy needs met.   40.6% of low end protein needs met.     Will continue collecting calorie count data for length of time recommended by RD. RD to assess/summarize per policy.    Judie Bonus, DTR

## 2014-07-02 NOTE — Plan of Care (Signed)
Problem: Pain - Acute  Goal: Control of acute pain  Outcome: Met  Pt denies any pain, only c/o today is feeling tired.    Problem: Mobility - Impaired  Goal: Able to ambulate within specified parameters  Outcome: Not Met  Pt c/o feeling tired, getting up to chair for meals, has not ambulated in the hallway yet today. Pt family at bedside, assists pt oob as needed.     Problem: Falls, Risk of  Goal: Keep patient free from falls utilizing universal fall precautions  Outcome: Met  Pt able to transfer with assist. Pt moves slowly. Uses her wheeled walker when walking in the hallway yesterday. Pt husband at bedside with pt, both instructed to call for assist as needed.     Problem: Discharge Planning  Goal: Participation in care planning  Outcome: Met  No plans for d/c today. Pt had pcxr done this am and started on lasix and spironolactone.     Problem: Fluid Volume Excess  Goal: Absence of edema; peripheral and/or pulmonary  Pt right lung diminished. Pt denies any sob. Pt started on lasix and spironolactone. Will monitor i/o's    Problem: Alteration in Blood Glucose  Goal: Glucose level within specified parameters  Outcome: Not Met  Pt last BG 221 before lunch. Pt with very poor appetite, stating not very hungry. Pt given sliding scale coverage only. Pt did not drink her glucerna at lunch either.

## 2014-07-03 LAB — LIVER PANEL, BLOOD
ALBUMIN: 3.4 g/dL — ABNORMAL LOW (ref 3.5–5.2)
ALKALINE PHOS: 89 U/L (ref 35–140)
ALT (SGPT): 31 U/L (ref 0–33)
AST (SGOT): 31 U/L (ref 0–32)
BILIRUBIN, DIR: 0.8 mg/dL — ABNORMAL HIGH (ref <0.2)
Bilirubin, Tot: 2.72 mg/dL — ABNORMAL HIGH (ref <1.20)
TOTAL PROTEIN: 5.7 g/dL — ABNORMAL LOW (ref 6.0–8.0)

## 2014-07-03 LAB — CBC WITH DIFF, BLOOD
ANC-Automated: 3.7 10*3/uL (ref 1.6–7.0)
Abs Eosinophils: 0.1 10*3/uL (ref 0.1–0.7)
Abs Lymphs: 0.6 10*3/uL — ABNORMAL LOW (ref 0.8–3.1)
Abs Monos: 0.7 10*3/uL (ref 0.2–0.8)
EOSINOPHILS: 2 % (ref 1–4)
HGB: 9.6 gm/dL — ABNORMAL LOW (ref 11.2–15.7)
Hct: 27.2 % — ABNORMAL LOW (ref 34.0–45.0)
LYMPHOCYTES: 12 % — ABNORMAL LOW (ref 19–53)
MCH: 32.4 pg — ABNORMAL HIGH (ref 26.0–32.0)
MCHC: 35.3 % (ref 32.0–36.0)
MCV: 91.9 um3 (ref 79.0–95.0)
MONOCYTES: 14 % — ABNORMAL HIGH (ref 5–12)
MPV: 10.2 fL (ref 9.4–12.4)
Plt Count: 51 10*3/uL — ABNORMAL LOW (ref 140–370)
RBC: 2.96 10*6/uL — ABNORMAL LOW (ref 3.90–5.20)
RDW: 15.8 % — ABNORMAL HIGH (ref 12.0–14.0)
Segs: 72 % — ABNORMAL HIGH (ref 34–71)
WBC: 5.2 10*3/uL (ref 4.0–10.0)

## 2014-07-03 LAB — BASIC METABOLIC PANEL, BLOOD
ANION GAP: 13 mmol/L (ref 7–15)
BUN: 39 mg/dL — ABNORMAL HIGH (ref 6–20)
Bicarbonate: 26 mmol/L (ref 22–29)
CALCIUM: 9 mg/dL (ref 8.5–10.6)
CHLORIDE: 95 mmol/L — ABNORMAL LOW (ref 98–107)
Creatinine: 1.23 mg/dL — ABNORMAL HIGH (ref 0.51–0.95)
GFR: 43 mL/min
Glucose: 130 mg/dL — ABNORMAL HIGH (ref 70–115)
Potassium: 4.4 mmol/L (ref 3.5–5.1)
Sodium: 134 mmol/L — ABNORMAL LOW (ref 136–145)

## 2014-07-03 LAB — GLUCOSE (POCT)
GLUCOSE (POCT): 137 mg/dL — ABNORMAL HIGH (ref 70–115)
GLUCOSE (POCT): 207 mg/dL — ABNORMAL HIGH (ref 70–115)
Glucose (POCT): 237 mg/dL — ABNORMAL HIGH (ref 70–115)
Glucose (POCT): 152 mg/dL — ABNORMAL HIGH (ref 70–115)

## 2014-07-03 LAB — PROTHROMBIN TIME, BLOOD
INR: 1.6
PT,PATIENT: 17.7 s — ABNORMAL HIGH (ref 9.7–12.5)

## 2014-07-03 LAB — MAGNESIUM, BLOOD: MAGNESIUM: 2.6 mg/dL (ref 1.7–2.6)

## 2014-07-03 LAB — PHOSPHORUS, BLOOD: Phosphorous: 2.3 mg/dL — ABNORMAL LOW (ref 2.7–4.5)

## 2014-07-03 MED ORDER — LACTULOSE 10 GM/15ML OR SOLN
20.0000 g | Freq: Three times a day (TID) | ORAL | Status: DC
Start: 2014-07-03 — End: 2014-07-04
  Administered 2014-07-03 – 2014-07-04 (×4): 20 g via ORAL
  Filled 2014-07-03 (×4): qty 30

## 2014-07-03 MED ORDER — POTASSIUM & SODIUM PHOSPHATES 280-160-250 MG OR PACK
1.0000 | PACK | Freq: Once | ORAL | Status: AC
Start: 2014-07-03 — End: 2014-07-03
  Administered 2014-07-03: 1 via ORAL
  Filled 2014-07-03: qty 1

## 2014-07-03 MED ORDER — INSULIN LISPRO (HUMAN) 100 UNIT/ML SC SOLN (CUSTOM)
11.0000 [IU] | Freq: Three times a day (TID) | INTRAMUSCULAR | Status: DC
Start: 2014-07-03 — End: 2014-07-04
  Administered 2014-07-03 – 2014-07-04 (×3): 11 [IU] via SUBCUTANEOUS
  Filled 2014-07-03 (×3): qty 11

## 2014-07-03 NOTE — Plan of Care (Signed)
Problem: Breathing Pattern - Ineffective  Goal: Respiratory rate, rhythm and depth return to baseline  Outcome: Met  Lungs are diminished at the bases specially R base. Using  3 liter oxygen/nasal cannula. Sat 97%. No acute resp distress noted.

## 2014-07-03 NOTE — Plan of Care (Signed)
Problem: Nausea/Vomiting  Goal: Absence of nausea  Outcome: Not Met  Pt was medicated for nausea/vomiting at lunch time. An hour and a half after,pt is vomiting while eating soup.unable to give any nausea medicine because zofran is scheduled every 6 hours.

## 2014-07-03 NOTE — Plan of Care (Addendum)
Problem: Fluid Volume Excess  Goal: Absence of edema; peripheral and/or pulmonary  Outcome: Not Met  Pt on lasix iv for pleural effusion. Has mild abdominal distention. Using 3 liters oxygen. Doe noted. Voiding.pt and family reminded of fluid restrictions 1.5 liters per day.verbalized understanding.    Problem: Alteration in Blood Glucose  Goal: Glucose level within specified parameters  Outcome: Not Met  Did not administer 18 units insulin lispro for breakfast and lunch because pt ate less than 50% for breakfast and was vomiting  At lunch time.

## 2014-07-03 NOTE — Interdisciplinary (Deleted)
PHYSICAL THERAPY EVALUATION NOTE    Discharge Information    PHYSICAL THERAPY DISCHARGE RECOMMENDATIONS  Discharge Physical Therapy and equipment needs: Patient will benefit from continued skilled Physical Therapy  Patient is appropriate for discharge to: previous living situation  Patient functional limitations necessitate consideration of the following durable medical equipment to complete mobility activities of daily living safely: Standard manual wheel chair    Admission Information    Reasons for Admission: Pt is a 73 year old woman with HCV cirrhosis (SVR), complicated by ascites, hepatic hydrothorax, hepatic encephalopathy. Admitted with SOB and found to have a large right pleural effusion in the setting of increased sodium intake. TIPS done 06/28/14. PT consulted for safety with mobility.  Reasons for Therapy: Difficulty with mobility  Mobility: Independent with mobility using assistive device  Type of Home: House  Home Layout: One level  Living Arrangements: Spouse / significant other  Home Equipment: 4-wheeled walker;Quad cane    Objective Assessment    Parameters:    Type of Eval: Evaluation  Additional  Precautions/Contrandications:: No Precautions  RLE: No Precautions  LLE: No Precautions  Medications Affecting Tx: None  Pain Score (0-10): 0  Pain Location: No complaints of pain    Exam:    Oral Exam: No deficits  Visual: no deficits  Auditory: WNL  Level of Consciousness: Alert  Orientation Level: Oriented X4  Safety: Good  Follows Commands: Multistep  Neglect: None  Coordination: no deficits  Sensation: no deficits  Spasticity: none  Tone: normal  Upper Extremity Comments: OT to consult. Please see OT note for details.    Mobility Assessment:    Rolling: mod assist  Supine - Sit: mod assist  Sit - Supine: not able to participate (Pt left up in chair)  Sit - Stand: min assit (CGA)  Stand - Sit: min assist (CGA)  Chair Transfer: min assist (CGA)  Ambulation: min assist (CGA)  Feet Ambulated:  200  Description of Gait: Pt ambulates with flexed posture, decreased step length, very slow pace. Pt became fatigued after ~150', required encouragement to continue to room and did so safely. Pt had no LOBs. Pt educated on importance of continued OOB activity throughout the day and agreed.  Equipment Needed: FWW  Static Balance - Sitting: Good  Static Balance - Standing: Good  Dynamic Balance - Sitting: Good  Dynamic Balance - Standing: Fair  Activity Tolerance: good    Lower Extremity Assessment    Splints/Positioning Devices: none  Lower Extremity Comments: BLE MMT grossly 4/5, AROM WFL  Upper Extremity Comments: OT to consult. Please see OT note for details.    Treatment Today  Type of Eval: Evaluation  Therapeutic Activities [97530]: x2          Treatment Plan  The goals listed below are achievable and realistic within the designated time frame and the treatments listed below, and referred to in the treatment plan, are necessary to achieve these goals. The functional goals were created based on the patient's prior level of function.    Diagnosis: Difficulty in walking (dec activity tolerance ) 719.7    Assessment  Assessment: Pt admitted w/ uncontrolled DM2 c/b neuropathy as well as HepC cirrhosis, HTN admitted 06/20/2014 w/ 5 days SOB and found to have R pleural effusion thought 2/2 dietary indiscretion. At baseline, pt is mod I with mobility using 4WW or quad cane at home. Pt demonstrates decreased activity tolerance, would benefit from [facility] for outdoor mobility. Pt would benefit from ongoing skilled PT to  address deficits in BLE strength, activity tolerance and balance to increase safety and independence with functional mobility. Anticipate safe DC home once medically cleared.    Problems  Problems: Functional mobility impairment;Strength impairment;Activity tolerance impairment    Functional Limitations  Functional Limitation   Current Impairment Category: L9357: Mobility  Current Impairment %: CJ: 20-39%  impaired, limited or restriced  Goal Impairment: S1779: Mobility  Goal Impairment %: CI: 1-19% impaired, limited or restricted  Rationale: Clinical judgement    Goals  Patient Stated Goal: To go home as able  Goal #1: Pt will be I with bed mobility  # Visits: 3-5  Progress: New  Goal #2: Pt will be mod I with transfers from bed <> chair using LRAD  # Visits: 3-5  Progress: New  Goal #3: Pt will be mod I to supervised with amb x at least 50' using LRAD for safe household amb  # Visits: 3-5  Progress: New    Recommendations          Patient to continue therapy:   Frequency: 3-5 times/wk       Total Treatment Time (min): 45  Total Timed Treatment (min) : 30

## 2014-07-03 NOTE — Progress Notes (Addendum)
Endocrine/Diabetes Progress Note    Overnight: no acute events    Objective:  Temperature:  [97.8 F (36.6 C)-98.3 F (36.8 C)] 98.1 F (36.7 C) (11/21 0732)  Blood pressure (BP): (107-126)/(35-47) 112/42 mmHg (11/21 0732)  Heart Rate:  [79-84] 80 (11/21 0732)  Respirations:  [13-20] 18 (11/21 0732)  Pain Score:  [-] 0 (11/21 0732)  O2 Device:  [-] None (Room air) (11/21 1454)  SpO2:  [92 %-97 %] 95 % (11/21 1454)  Body mass index is 21.71 kg/(m^2).    Diet: CHO Limited + Glucerna tid    Blood Sugars reviewed:      AM Lu Di  HS O/N      11.15.15 147 273 123  84  Lantus   26  Lispro  8+4 10+8+6  10     11.16.15 108 291 139  223  Lantus   26  Lispro  5+6 8+7 6+5  +4    11.17.15 129 195 117  127  Lantus   26  Lispro  10+6 +4 10+6    11.18  115 151 90/61  135  Lantus   26  Lispro   +3    11.19  172 283 344  245  Lantus   26  Lispro  6+3 6+8 6+10  +5    11.20  205 358 438  239  Lantus   26  Lispro  5+3 7+10 14+7+10 7    11.21  215 315 179 202  Lantus   30  Lispro  9+5 9+10 9+4 0    11.22  171 221 209 214  Lantus   30  Lispro  9+3 5 9+5 +4     11.23  137 237   Lantus   30  Lispro  9 9+6           A/P:  Paula Manning is a 73 year old female w/ uncontrolled DM2 c/b neuropathy as well as HepC cirrhosis, HTN admitted 06/20/2014 w/ 5 days SOB and found to have R pleural effusion thought 2/2 dietary indiscretion.     BGs cont high lunch and after, increase mealtime for breakfast.    Inpatient Recs:   -Cont Lantus to 30 units daily before lunch  -Cont Lispro  18 units TID with meals, RN to give 0, 1/2, whole dose based on % tray consumed  - Increase Lispro 9--->11 units TID AC, "FOR Glucerna COVERAGE, do not give if pt not drinking supplement."  - Cont High correction scale w lispro ac/hs    Outpatient Recs/Education:    1. Self-monitor blood glucose: has meter covered per insurance and strips    2. Healthy Eating: reviewed cho groups with family and pt, nurse to show Emmi videos    3. Activity:per MD    4.  Hypoglycemia: need to review    5. Medication: likely modify dosages, but same, oral and insulin.    6. Discharge regimen:TBD, likely Lantus + orals    7. Follow-up: f/u w/ PCP Dr. Rayburn Felt.    Will cont to follow    DWA Dr. Karma Lew    Signed,  Shanon Brow  Endocrine Fellow  07/01/2014

## 2014-07-03 NOTE — Plan of Care (Signed)
Problem: Pain - Acute  Goal: Control of acute pain  Outcome: Met  Denies any pain at this time.appears comfortable.    Problem: Falls, Risk of  Goal: Keep patient free from falls utilizing universal fall precautions  Outcome: Met  Instructed to always call for assist. Bed alarm turned on. Family also at bedside and calls the staff for pt's needs. No falls.

## 2014-07-03 NOTE — Plan of Care (Signed)
Problem: Mobility - Impaired  Goal: Able to ambulate within specified parameters  Outcome: Not Met  PT came and ambulated pt in the hallways with walker use.

## 2014-07-03 NOTE — Interdisciplinary (Addendum)
LOS# 13 days.  Patient currently back in [facility], Rm 1128.  S/P IR TIPS on 05/28/2014, went to IMU - [place] post procedure.  Case Manager spoke with the TEAM with Attending - Dr. Van Clines [phone] , This patient is not stable for transfer at this time, according to the [facility] [phone]   , there's no  Hepatologist that will manage this patient  in [facility] if transferred there.  Case Manager called and left a voicemail message to [facility] ([phone] x [phone]) patient not ready for transfer. Awaiting call back.  CM following.      Addendum:  CM notes today faxed to [facility] @ fax# [phone] , per request,

## 2014-07-03 NOTE — Plan of Care (Signed)
Problem: Nausea/Vomiting  Goal: Absence of nausea  Outcome: Not Met  Pt on q6hrs zofran.   Goal: Absence of vomiting  Outcome: Not Met  Pt had episode of vomiting 100 ml out [place] fluid, pt refused prn zofran, given scheduled zofran at [place]. Will continue to monitor. As per report poor po intake during day.

## 2014-07-03 NOTE — Progress Notes (Signed)
INPATIENT HEPATOLOGY/GASTROENTEROLOGY PROGRESS NOTE   Date:  07/03/2014   Author: [facility] Stay:   13 days - Admitted on: 06/20/2014    Overnight Events/Subjective:    --intermittent O2 up to 2-3L   --Cr stable after restarting diuretics  --No BM's recorded overnight    Medications:   calcium carbonate  600 mg BID    cholecalciferol  2,000 Units Daily    ciprofloxacin  500 mg Q24H    famotidine  20 mg Daily    furosemide  20 mg Daily    insulin glargine  30 Units Daily before lunch    insulin lispro  1-10 Units 4x Daily WC    insulin lispro  11 Units TID WC    insulin lispro  18 Units TID WC    lactulose  20 g TID    multivitamin with minerals  1 tablet Daily    ondansetron  4 mg Q6H    rifaximin  550 mg Q12H    sodium chloride (PF)  3 mL Q8H    spironolactone  50 mg Daily       Objective:    Vital Signs:  Temperature:  [97.4 F (36.3 C)-98.5 F (36.9 C)] 98.3 F (36.8 C) (11/23 1529)  Blood pressure (BP): (102-111)/(40-50) 109/42 mmHg (11/23 1529)  Heart Rate:  [72-83] 73 (11/23 1529)  Respirations:  [16-20] 18 (11/23 1529)  Pain Score:  [-] 0 (11/23 1529)  O2 Device:  [-] None (Room air) (11/23 0929)  SpO2:  [92 %-98 %] 98 % (11/23 1529)    Intake/Output (Current Shift):  11/22 0600 - 11/23 0559  In: 945 [P.O.:939; I.V.:6]  Out: 1650 [Urine:1550]    Physical Exam:  General: NAD  HEENT: No scleral icterus,NC in place, trachea midline, mucus membranes moist.  Lungs: Decreased BS on R side up to mid/upper lungs  Cardiovascular: Irregular  Abdomen: Distended with mild fluid wave  Extremities: Edema present bilaterally  Skin: Non-icteric and no visible rash  Neuro: No asterixis    Laboratory data:   Recent Labs      07/01/14   0651  07/02/14   0548  07/03/14   0533   NA  130*  127*  134*   K  4.4  4.3  4.4   CL  93*  91*  95*   BICARB  _0 BUN  45*  44*  39*   CREAT  1.27*  1.22*  1.23*     8.7  9.0  9.0   MG  2.6  2.8*  2.6   PHOS  2.6*  2.7  2.3*   TP  5.8*  6.0   5.7*   ALB  3.0*  3.8  3.4*   TBILI  2.74*  3.23*  2.72*   DBILI  0.8*  0.8*  0.8*   AST  44*  37*  31   ALT  44*  36*  31   ALK  91  87  89       Recent Labs      07/01/14   0651  07/02/14   0548  07/03/14   0533   WBC  7.4  6.0  5.2   HGB  9.7*  9.5*  9.6*   HCT  26.8*  26.9*  27.2*   MCV  89.6  90.6  91.9   PLT  47*  49*  51*   SEG  75*  76*  72*   LYMPHS  11*  11*  12*   MONOS  12  11  14*   EOS  _0 Recent Labs      07/01/14   0651  07/02/14   0548  07/03/14   0533   INR  1.7  1.6  1.6       Imaging/Studies:  ABD U/S  IMPRESSION:  1. Large right pleural effusion.    2. Cirrhotic liver with sequela of portal hypertension including ascites and   splenomegaly.    3. 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 The   right and left portal veins remain patent with flow in the proper direction.    4. 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    TTE 11/16  Conclusions:  1) LV is small and has normal systolic function.  2) Mild LV diastolic dysfunction suggesting normal or low left atrial  pressure.  3) Small pericardial effusion without evidence of tamponade.  4) Compared to previous study on 10/16/2011, no significant change.     CT abd/pelvis 11/17  IMPRESSION:  CT scan of the abdomen and pelvis with and without IV contrast    1. 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 Additional extension to the splenic vein.   Hepatic veins are patent.    2. Sequelae of portal hypertension including splenomegaly, paraesophageal,   rectal, perisplenic, and perivertebral varices. Moderate ascites.    3. No reportable LI-RADS lesions.    4. 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     #R pleural effusion: Repeat thoracentesis c/w hydrothorax. No signs of infection or new onset CHF at this time. Seems to be recurreing despite diuresis and salt restriction.  S/p thora on 11/13 and 11/16.  Small post-thora PTX, now resolved.  CXR worsening today.    --s/p TIPS 11/18 PSG 22 => 7  --cont diuretics withlasix 27m/spiro 536m --wean O2 as tolerated  --low Na diet, nutrition following for calorie counts. 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 Relative stable disease.  TTE late positive bubble for shunt.     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 BB stopped in setting of bradycardia, TIPS placed for HHT and should decompress EV.  *PVT: Repeat CT demonstrating non-obstructive thrombus in main PV/L PV with extension into splenic vein.  No anticoagulation  at this time and nonobstructive flows during TIPS placement.   *Ascites: No SBP on dx para 11/10. Cont cipro ppx. Low salt diet. S/p TIPS as noted above.   *HE: On rifaximin and lactulose, titrate to 3-4 BM's per day  *HCC: Imaging from 06/2014 without HCC. Serial imaging for IPMN    DWA Silverio Decamp, MD  [facility]

## 2014-07-03 NOTE — Interdisciplinary (Signed)
Clinical Nutr Kcal Count Results:   Kcal Count note for day 3. Data from 07/02/2014..   Rev. Estimated Nutritional needs: [place] jeor (55kg) 1003 x 1.2-1.5 1204-1504kcals (22-27kcals/kg)   REE Per Mifflin(53.6 Kg): 64-80 g protein/day (1.2-1.5g/kg). Maintenance Fluids: deferred to MD/ on diuretics  PO Diet Rx: 2g Na + Glucerna TID   PO intake: 492 kcal 29 grams Protein (1 meals recorded + 50% of 1 Prosource + 0% of 1 & 100% of 2 Glucerna supplements)   41% of low end energy needs met.   45% of low end protein needs met.   Will continue collecting kcal count data and RD to assess/summarize per policy.  Horris Latino, BS, NDTR

## 2014-07-03 NOTE — Plan of Care (Signed)
Problem: Alteration in Blood Glucose  Goal: Glucose level within specified parameters  Outcome: Not Met  Pt's fingerstick before dinner 207.covered with 5 units and also gvien 11 units for glucerna. Pt is finishing up drinking glucerna. Did not eat dinner. Ad spoon of ashed potato. Also family brought lentil soup.pt is taking time eating because she is scared of vomiting again. zofran iv given every 6 hours.

## 2014-07-03 NOTE — Plan of Care (Signed)
Problem: Pain - Acute  Goal: Control of acute pain  Outcome: Met  Pt denies pain. Resting in bed.    Problem: Breathing Pattern - Ineffective  Goal: Respiratory rate, rhythm and depth return to baseline  Outcome: Met  No SOB, O2 sat 92-94%, pt prefers lying on right side. Will continue to monitor.     Problem: Mobility - Impaired  Goal: Able to ambulate within specified parameters  Outcome: Not Met  Pt resting in bed, as per family gets up with assist but was not seen. Family is very involved with care and uses bed pan if needs. Instructed to call for assistance if needing to get up out of bed.     Problem: Falls, Risk of  Goal: Keep patient free from falls utilizing universal fall precautions  Outcome: Met  Pt resting in bed, instructed to call for assistance if getting up out of bed.     Problem: Discharge Planning  Goal: Participation in care planning  Outcome: Met  Pt and family speaks mostly spanish with some english. Cooperative with plan of care, no orders for d/c.     Problem: Fluid Volume Excess  Goal: Absence of edema; peripheral and/or pulmonary  Outcome: Not Met  Pt has large abd ascites and on lasix and aldactone, as per xray right opacities and lung effusion.Will continue to monitor.    Problem: Alteration in Blood Glucose  Goal: Glucose level within specified parameters  Outcome: Not Met  HS FS 214, pt given 4 units lispro subcutaneous to left abdomen, will continue to monitor.

## 2014-07-03 NOTE — Progress Notes (Signed)
Daily Progress Note:  06/21/2014     Current Hospital Stay:   13 days - Admitted on: 06/20/2014    Subjective:  Desaturated overnight, now on 3L oxygen. Reports mild dyspnea.     Objective:    Vital Signs:  Temperature:  [97.4 F (36.3 C)-98.5 F (36.9 C)] 97.4 F (36.3 C) (11/23 0929)  Blood pressure (BP): (102-111)/(40-50) 110/50 mmHg (11/23 0929)  Heart Rate:  [72-98] 72 (11/23 0929)  Respirations:  [16-20] 18 (11/23 0929)  Pain Score:  [-] 0 (11/22 1546)  O2 Device:  [-] None (Room air) (11/23 0929)  SpO2:  [92 %-97 %] 97 % (11/23 0929)    Wt Readings from Last 1 Encounters:   07/03/14 54.2 kg (119 lb 7.8 oz)       Intake/Output (Current Shift):  11/23 0600 - 11/23 1759  In: 3 [I.V.:3]  Out: -       Physical Exam:  GEN: elderly, spanish speaking, thin female laying in bed   HEENT: NCAT, neck supple, EOMI  CV: rrr  Pulmonary: decreased BS R base   Gastrointestinal: +NABS, soft, nt   Neuro: no asterixis     Laboratory data:   Lab Results   Component Value Date    NA 134* 07/03/2014    K 4.4 07/03/2014    CL 95* 07/03/2014    BICARB 26 07/03/2014    BUN 39* 07/03/2014    CREAT 1.23* 07/03/2014    GLU 130* 07/03/2014    [place] 9.0 07/03/2014     Lab Results   Component Value Date    WBC 5.2 07/03/2014    HGB 9.6* 07/03/2014    HCT 27.2* 07/03/2014    PLT 51* 07/03/2014    SEG 72* 07/03/2014    LYMPHS 12* 07/03/2014    MONOS 14* 07/03/2014    EOS 2 07/03/2014     Lab Results   Component Value Date    AST 31 07/03/2014    ALT 31 07/03/2014    ALK 89 07/03/2014    TBILI 2.72* 07/03/2014    DBILI 0.8* 07/03/2014    TP 5.7* 07/03/2014    ALB 3.4* 07/03/2014     CT a/p 11/17   1. 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 Additional extension to the splenic vein.   Hepatic veins are patent.  2. Sequelae of portal hypertension including splenomegaly, paraesophageal,   rectal, perisplenic, and perivertebral varices. Moderate ascites.  3. No reportable LI-RADS  lesions.  4. 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  2) Mild LV diastolic dysfunction suggesting normal or low left atrial  pressure.  3) Small pericardial effusion without evidence of tamponade.  4) Compared to previous study on 10/16/2011, no significant change.  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     CXR 11/22  Increasing partially loculated large right effusion with underlying pacities   in the right lung. Otherwise the cardiac silhouette and mediastinal contours   are unchanged. Vascular calcifications noted. No new dense airspace   consolidations of the left lung noted. No definite pneumothorax seen. No other  change from prior study.    Assessment and Plan:  Paula Manning is a 73 year old  spanish speaking only female with PMHx of HepC cirrhosis s/p treatment with achievement of SVR, DM, HTN who presents to the ED for shortness of breath.     #. 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   -Strict I/O  -Low Na diet   -Nutrition education, stressed importance of adhering to low salt diet     #AKI: improving. Cr stable from yesterday   -continue lasix 20 IV/aldactone 50 mg     #. Hep C Cirrhosis: s/p treatment of Hep C with SVR achieved   Ascites-s/p TIPS. Diuretics per above   HE-lactulose TID (titrated to 3-4BM), rifaximin   EV-sm distal EV 5/13: propanolol dc'd at last admission 2/2 bradycardia   Continue cipro for SBP ppx   HCC: CT done 06/27/14     #. DM: on lantus and glipizide as outpatient. BG poorly controlled on admission.  -Cont lantus, lispro qac, SSI. Diabetes  team consulted, appreciate recs.    FEN:  low sodium diet diabetic mechanical soft diet (dentures lost in transit), cont cal count  PPx: SCDs, Hold chemoppx given thrombocytopenia  FC/FC     Dispo: Patient is currently medically unstable as evidenced by her high risk for developing hepatic encephalopathy given >50% drop in pressure gradient after TIPS, persistent hypoxia with oxygen requirement that requires careful titration of her diuretics, and rise in LFTs.  Patient is therefore not medically stable for transfer to [facility] at this time. Will re-evaluate stability for transfer daily.

## 2014-07-03 NOTE — Interdisciplinary (Signed)
OCCUPATIONAL THERAPY EVALUATION NOTE    Admission Information    Reasons for Admission: Pt is a 73 year old woman with HCV cirrhosis (SVR), complicated by ascites, hepatic hydrothorax, hepatic encephalopathy. Admitted with SOB and found to have a large right pleural effusion in the setting of increased sodium intake. TIPS done 06/28/14.   Reasons for Therapy: Difficulty with ADL's  ADL: Needs assist with ADLs/functional mobility (Pt reports her spouse assists with ADLs)  Type of Home: House  Home Layout: One level  Living Arrangements: Spouse / significant other  Home Equipment: 4-wheeled walker;Quad cane    Objective Assessment    Parameters:    Type of Eval: Evaluation 97003  RUE: No Precautions  LUE: No Precautions  RLE: No Precautions  LLE: No Precautions  Additional  Precautions/Contrandications:: Fall Risk  Equipment Present: Oxygen  Pain Score (0-10): 0  Pain Location: Pt reported no pain    Exam and Neuro Assessment:    Oral Exam: no deficits  Visual: no deficits  Auditory: no deficits  Level of Consciousness: alert  Orientation Level: oriented x4  Safety: fair  Follows Commands: multistep  Neglect: none  Coordination: No deficits  Sensation: No deficits  Spasticity: None    ADL and Mobility Assessment:    Feeding: Independent  Grooming/Hygiene: supervision  Upper Body Dressing: min assist  Lower Body Dressing: max assist  Toileting: max assist  Bathing: not able to participate  Bed Mobility: mod independent  Toilet Transfer: supervision  Tub/Shower Transfer: not able to participate  Sit - Stand : supervision  Sitting Balance: Good  Standing Balance: Fair  ADL Comments: Pt was getting assistance with ADLs at home, currently min-max A for ADLs  Endurance: fair    Upper Extremity Assessment    Splints/Positioning Devices: None indicated   Upper Extremity Comments: RUE ROM and strength WFL, LUE shoulder limited to about 90 degrees flexion which pt attributed to a previous fracture; elbow, wrist, and hand  WFL  Lower Extremity Comments: PT on consult     Treatment Today    Type of Eval: Evaluation 97003  ADL Training [97535]: x2                Treatment Plan  The goals listed below are achievable and realistic within the designated time frame and the treatments listed below, and referred to in the treatment plan, are necessary to achieve these goals. The functional goals were created based on the patient's prior level of function.    Diagnosis: Difficulty in walking (dec activity tolerance ) 719.7    Assessment    Pt is a 73 year old woman with HCV cirrhosis (SVR), complicated by ascites, hepatic hydrothorax, hepatic encephalopathy. Admitted with SOB and found to have a large right pleural effusion in the setting of increased sodium intake. TIPS done 06/28/14. OT consulted to assess ADLs, initiated consult today. Pt received supine in bed, transferred to edge of bed with modified independence. Pt unable to manage socks, max A. Pt reports her spouse was assisting with ADLs at home. Pt sit to stand with contact guarding and verbal cues for correct hand placement with FWW. Pt transferred to a bedside commode with contact guarding, returned to sitting at edge of bed with verbal cues and contact guarding for safe use of walker and hand placement. Pt currently with limited activity tolerance and safety with OOB ADLs, would benefit from OT to maximize independence and safety during functional mobility and ADLs.    Problems  Problems:  ADL impairment      Goals  Patient Stated Goal: Pt goal is to get better   Goal #1: Pt will complete toileting with supervision.  # Visits: 5-7  Progress: New  Goal #2: Pt will complete lower body dressing with min A using adaptive equipment as needed.  # Visits: 5-7  Goal #3: Pt will complete standing ADLs for 15 minutes with supervision.  # Visits: 5-7  Progress: New    Recommendations  Equipment Recommendations: to be determined as patient progresses with therapy    Patient to continue  therapy:   Frequency: 3-5 times/wk       Total Treatment Time (min): 45  Total Timed Treatment (min) : 30

## 2014-07-03 NOTE — Interdisciplinary (Signed)
PHYSICAL THERAPY EVALUATION NOTE    Discharge Information    PHYSICAL THERAPY DISCHARGE RECOMMENDATIONS  Discharge Physical Therapy and equipment needs: Patient will benefit from continued skilled Physical Therapy  Patient is appropriate for discharge to: previous living situation  Patient functional limitations necessitate consideration of the following durable medical equipment to complete mobility activities of daily living safely: Standard manual wheel chair    Admission Information    Reasons for Admission: Pt is a 73 year old woman with HCV cirrhosis (SVR), complicated by ascites, hepatic hydrothorax, hepatic encephalopathy. Admitted with SOB and found to have a large right pleural effusion in the setting of increased sodium intake. TIPS done 06/28/14. PT consulted for safety with mobility.  Reasons for Therapy: Difficulty with mobility  Mobility: Independent with mobility using assistive device  Type of Home: House  Home Layout: One level  Living Arrangements: Spouse / significant other  Home Equipment: 4-wheeled walker;Quad cane    Objective Assessment    Parameters:    Type of Eval: Evaluation  Additional  Precautions/Contrandications:: No Precautions  RLE: No Precautions  LLE: No Precautions  Medications Affecting Tx: None  Pain Score (0-10): 0  Pain Location: No complaints of pain    Exam:    Oral Exam: No deficits  Visual: no deficits  Auditory: WNL  Level of Consciousness: Alert  Orientation Level: Oriented X4  Safety: Good  Follows Commands: Multistep  Neglect: None  Coordination: no deficits  Sensation: no deficits  Spasticity: none  Tone: normal  Upper Extremity Comments: OT to consult. Please see OT note for details.    Mobility Assessment:    Rolling: mod assist  Supine - Sit: mod assist  Sit - Supine: not able to participate (Pt left up in chair)  Sit - Stand: min assit (CGA)  Stand - Sit: min assist (CGA)  Chair Transfer: min assist (CGA)  Ambulation: min assist (CGA)  Feet Ambulated:  200  Description of Gait: Pt ambulates with flexed posture, decreased step length, very slow pace. Pt became fatigued after ~150', required encouragement to continue to room and did so safely. Pt had no LOBs. Pt educated on importance of continued OOB activity throughout the day and agreed.  Equipment Needed: FWW  Static Balance - Sitting: Good  Static Balance - Standing: Good  Dynamic Balance - Sitting: Good  Dynamic Balance - Standing: Fair  Activity Tolerance: good    Lower Extremity Assessment    Splints/Positioning Devices: none  Lower Extremity Comments: BLE MMT grossly 4/5, AROM WFL  Upper Extremity Comments: OT to consult. Please see OT note for details.    Treatment Today  Type of Eval: Evaluation  Therapeutic Activities [97530]: x2          Treatment Plan  The goals listed below are achievable and realistic within the designated time frame and the treatments listed below, and referred to in the treatment plan, are necessary to achieve these goals. The functional goals were created based on the patient's prior level of function.    Diagnosis: Difficulty in walking (dec activity tolerance ) 719.7    Assessment  Assessment: Pt admitted w/ uncontrolled DM2 c/b neuropathy as well as HepC cirrhosis, HTN admitted 06/20/2014 w/ 5 days SOB and found to have R pleural effusion thought 2/2 dietary indiscretion. At baseline, pt is mod I with mobility using 4WW or quad cane at home. Pt demonstrates decreased activity tolerance, would benefit from [facility] for outdoor mobility. Pt would benefit from ongoing skilled PT to  address deficits in BLE strength, activity tolerance and balanace to increase safety and independence with functional mobility. Anticipate safe DC home once medically cleared.    Problems  Problems: Functional mobility impairment;Strength impairment;Activity tolerance impairment    Functional Limitations  Functional Limitation   Current Impairment Category: O9629: Mobility  Current Impairment %: CJ: 20-39%  impaired, limited or restriced  Goal Impairment: B2841: Mobility  Goal Impairment %: CI: 1-19% impaired, limited or restricted  Rationale: Clinical judgement    Goals  Patient Stated Goal: To go home as able  Goal #1: Pt will be I with bed mobility  # Visits: 3-5  Progress: New  Goal #2: Pt will be mod I with transfers from bed <> chair using LRAD  # Visits: 3-5  Progress: New  Goal #3: Pt will be mod I to supervised with amb x at least 50' using LRAD for safe household amb  # Visits: 3-5  Progress: New    Recommendations          Patient to continue therapy:   Frequency: 3-5 times/wk  Focus for next treatment: Gait training;Functional mobility training;Balance activities;Graded tasks to increase function;Safety instruction;Therapeutic exercise     Total Treatment Time (min): 45  Total Timed Treatment (min) : 30

## 2014-07-04 DIAGNOSIS — R262 Difficulty in walking, not elsewhere classified: Secondary | ICD-10-CM

## 2014-07-04 DIAGNOSIS — C801 Malignant (primary) neoplasm, unspecified: Secondary | ICD-10-CM

## 2014-07-04 DIAGNOSIS — D649 Anemia, unspecified: Secondary | ICD-10-CM

## 2014-07-04 DIAGNOSIS — E119 Type 2 diabetes mellitus without complications: Secondary | ICD-10-CM

## 2014-07-04 LAB — BASIC METABOLIC PANEL, BLOOD
ANION GAP: 11 mmol/L (ref 7–15)
BUN: 30 mg/dL — ABNORMAL HIGH (ref 6–20)
Bicarbonate: 27 mmol/L (ref 22–29)
CALCIUM: 9 mg/dL (ref 8.5–10.6)
CHLORIDE: 98 mmol/L (ref 98–107)
Creatinine: 0.99 mg/dL — ABNORMAL HIGH (ref 0.51–0.95)
GFR: 55 mL/min
Glucose: 82 mg/dL (ref 70–115)
Potassium: 3.9 mmol/L (ref 3.5–5.1)
Sodium: 136 mmol/L (ref 136–145)

## 2014-07-04 LAB — LIVER PANEL, BLOOD
ALBUMIN: 3.3 g/dL — ABNORMAL LOW (ref 3.5–5.2)
ALKALINE PHOS: 96 U/L (ref 35–140)
ALT (SGPT): 31 U/L (ref 0–33)
AST (SGOT): 35 U/L — ABNORMAL HIGH (ref 0–32)
BILIRUBIN, DIR: 0.8 mg/dL — ABNORMAL HIGH (ref <0.2)
BILIRUBIN, TOT: 2.6 mg/dL — ABNORMAL HIGH (ref <1.20)
TOTAL PROTEIN: 5.8 g/dL — ABNORMAL LOW (ref 6.0–8.0)

## 2014-07-04 LAB — GLUCOSE (POCT)
Glucose (POCT): 102 mg/dL (ref 70–115)
Glucose (POCT): 174 mg/dL — ABNORMAL HIGH (ref 70–115)

## 2014-07-04 LAB — CBC WITH DIFF, BLOOD
ANC-Automated: 3.1 10*3/uL (ref 1.6–7.0)
Abs Eosinophils: 0.1 10*3/uL (ref 0.1–0.7)
Abs Lymphs: 0.6 10*3/uL — ABNORMAL LOW (ref 0.8–3.1)
Abs Monos: 0.7 10*3/uL (ref 0.2–0.8)
EOSINOPHILS: 3 % (ref 1–4)
HGB: 9.7 gm/dL — ABNORMAL LOW (ref 11.2–15.7)
Hct: 27.5 % — ABNORMAL LOW (ref 34.0–45.0)
LYMPHOCYTES: 13 % — ABNORMAL LOW (ref 19–53)
MCH: 32.7 pg — ABNORMAL HIGH (ref 26.0–32.0)
MCHC: 35.3 % (ref 32.0–36.0)
MCV: 92.6 um3 (ref 79.0–95.0)
MONOCYTES: 15 % — ABNORMAL HIGH (ref 5–12)
MPV: 10 fL (ref 9.4–12.4)
Plt Count: 43 10*3/uL — ABNORMAL LOW (ref 140–370)
RBC: 2.97 10*6/uL — ABNORMAL LOW (ref 3.90–5.20)
RDW: 16.2 % — ABNORMAL HIGH (ref 12.0–14.0)
Segs: 69 % (ref 34–71)
WBC: 4.5 10*3/uL (ref 4.0–10.0)

## 2014-07-04 LAB — MAGNESIUM, BLOOD: MAGNESIUM: 2.4 mg/dL (ref 1.7–2.6)

## 2014-07-04 LAB — PROTHROMBIN TIME, BLOOD
INR: 1.6
PT,PATIENT: 17.5 s — ABNORMAL HIGH (ref 9.7–12.5)

## 2014-07-04 LAB — PHOSPHORUS, BLOOD: PHOSPHOROUS: 3.1 mg/dL (ref 2.7–4.5)

## 2014-07-04 MED ORDER — SITAGLIPTIN PHOSPHATE 50 MG OR TABS
50.0000 mg | ORAL_TABLET | Freq: Every day | ORAL | Status: AC
Start: 2014-07-04 — End: 2015-06-29

## 2014-07-04 MED ORDER — PNEUMOCOCCAL VAC POLYVALENT 25 MCG/0.5ML IJ INJ (CUSTOM)
0.5000 mL | INJECTION | INTRAMUSCULAR | Status: DC
Start: 2014-07-04 — End: 2014-07-04

## 2014-07-04 MED ORDER — INFLUENZA VAC SPLIT HIGH-DOSE 0.5 ML IM SUSY
0.5000 mL | PREFILLED_SYRINGE | INTRAMUSCULAR | Status: DC
Start: 2014-07-04 — End: 2014-07-04

## 2014-07-04 MED ORDER — SPIRONOLACTONE 50 MG OR TABS
50.0000 mg | ORAL_TABLET | Freq: Every day | ORAL | Status: AC
Start: 2014-07-04 — End: ?

## 2014-07-04 MED ORDER — LACTULOSE 10 GM/15ML OR SOLN
20.0000 g | Freq: Three times a day (TID) | ORAL | Status: AC
Start: 2014-07-04 — End: ?

## 2014-07-04 MED ORDER — INSULIN GLARGINE 100 UNIT/ML SC SOLN
20.0000 [IU] | Freq: Every day | SUBCUTANEOUS | Status: AC
Start: 2014-07-04 — End: ?

## 2014-07-04 MED ORDER — GLIPIZIDE 5 MG OR TABS
2.5000 mg | ORAL_TABLET | Freq: Two times a day (BID) | ORAL | Status: AC
Start: 2014-07-04 — End: ?

## 2014-07-04 NOTE — Interdisciplinary (Deleted)
Remo Lipps from IR called and spoke to [place] to send pt down and to start FFP. She Called blood bank and FFP is not ready yet. She told Remo Lipps /IR that its not ready yet. She will need to hang it down there. Pt has no consent yet. preop checklist  Completed. Transport here and picking up pt.IV to L ac saline locked,flushed and its patent.

## 2014-07-04 NOTE — Progress Notes (Signed)
INPATIENT HEPATOLOGY/GASTROENTEROLOGY PROGRESS NOTE   Date:  07/04/2014   Author:      Current Hospital Stay:   14 days - Admitted on: 06/20/2014    Overnight Events/Subjective:    --intermittent O2 needs, but primarily for comfort with O2 sats never dropping below 97% and off oxygen by morning  --feels improved respiratory status when ambulating, asking to go home.  After ambulating O2 sat recorded at 94%.     Medications:  • calcium carbonate  600 mg BID   • cholecalciferol  2,000 Units Daily   • ciprofloxacin  500 mg Q24H   • famotidine  20 mg Daily   • furosemide  20 mg Daily   • insulin glargine  30 Units Daily before lunch   • insulin lispro  1-10 Units 4x Daily WC   • insulin lispro  11 Units TID WC   • insulin lispro  18 Units TID WC   • lactulose  20 g TID   • multivitamin with minerals  1 tablet Daily   • ondansetron  4 mg Q6H   • rifaximin  550 mg Q12H   • sodium chloride (PF)  3 mL Q8H   • spironolactone  50 mg Daily       Objective:    Vital Signs:  Temperature:  [97.4 °F (36.3 °C)-98.6 °F (37 °C)] 98.6 °F (37 °C) (11/24 0400)  Blood pressure (BP): (108-142)/(42-75) 116/49 mmHg (11/24 0848)  Heart Rate:  [72-77] 73 (11/24 0848)  Respirations:  [18-20] 20 (11/24 0400)  Pain Score:  [-] 0 (11/24 0400)  O2 Device:  [-] Nasal cannula (11/24 0400)  O2 Flow Rate (L/min):  [2 l/min] 2 l/min (11/24 0400)  SpO2:  [97 %-100 %] 97 % (11/24 0400)    Intake/Output (Current Shift):  11/23 0600 - 11/24 0559  In: 1073 [P.O.:1061; I.V.:12]  Out: 2452 [Urine:2450]    Physical Exam:  General: NAD  HEENT: No scleral icterus,NC in place, trachea midline, mucus membranes moist.  Lungs: Decreased BS on R side up to mid/upper lungs  Cardiovascular: Irregular  Abdomen: Distended with mild fluid wave  Extremities: Edema present bilaterally  Skin: Non-icteric and no visible rash  Neuro: No asterixis    Laboratory data:   Recent Labs      07/02/14   0548  07/03/14   0533  07/04/14   0613   NA  127*  134*  136   K  4.3   4.4  3.9   CL  91*  95*  98   BICARB  24  26  27   BUN  44*  39*  30*   CREAT  1.22*  1.23*  0.99*   CA  9.0  9.0  9.0   MG  2.8*  2.6  2.4   PHOS  2.7  2.3*  3.1   TP  6.0  5.7*  5.8*   ALB  3.8  3.4*  3.3*   TBILI  3.23*  2.72*  2.60*   DBILI  0.8*  0.8*  0.8*   AST  37*  31  35*   ALT  36*  31  31   ALK  87  89  96       Recent Labs      07/02/14   0548  07/03/14   0533  07/04/14   0613   WBC  6.0  5.2  4.5   HGB  9.5*  9.6*  9.7*     HCT  26.9*  27.2*  27.5*   MCV  90.6  91.9  92.6   PLT  49*  51*  43*   SEG  76*  72*  69   LYMPHS  11*  12*  13*   MONOS  11  14*  15*   EOS  2  2  3       Recent Labs      07/02/14   0548  07/03/14   0533  07/04/14   0613   INR  1.6  1.6  1.6       Imaging/Studies:  ABD U/S  IMPRESSION:  1. Large right pleural effusion.    2. Cirrhotic liver with sequela of portal hypertension including ascites and   splenomegaly.    3. 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 The   right and left portal veins remain patent with flow in the proper direction.    4. 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    TTE 11/16  Conclusions:  1) LV is small and has normal systolic function.  2) Mild LV diastolic dysfunction suggesting normal or low left atrial  pressure.  3) Small pericardial effusion without evidence of tamponade.  4) Compared to previous study on 10/16/2011, no significant change.     CT abd/pelvis 11/17  IMPRESSION:  CT scan of the abdomen and pelvis with and without IV contrast    1. 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 Additional extension to the splenic vein.   Hepatic veins are patent.    2. Sequelae of portal hypertension including splenomegaly, paraesophageal,   rectal, perisplenic, and perivertebral varices. Moderate ascites.    3. No  reportable LI-RADS lesions.    4. 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     #R pleural effusion: Repeat thoracentesis c/w hydrothorax. No signs of infection or new onset CHF at this time. Seems to be recurreing despite diuresis and salt restriction.  S/p thora on 11/13 and 11/16.  Small post-thora PTX, now resolved.      --s/p TIPS 11/18 PSG 22 => 7  --can change to PO diuretics with lasix 40mg/spironolactone 100mg for maintenance  --O2 weaned off, if no desats after ambulating OK for discharge home with PO diuretics for maintenance  --low Na diet, nutrition following for calorie counts. Encourage increased PO intake    #AKI: Likely related to fluid shifts from thoracentesis and diuretics now resolved to baseline.     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 Relative stable disease.  TTE late positive bubble for shunt.     - low salt diet  - mont mental status closely given large gradient drop  - strict I/Os + daily wts  - titrate lactulose for 3-4BMs per day  - cont zofran for report of nausea    *EV: 05/2013   grade I EV. BB stopped in setting of bradycardia, TIPS placed for HHT and should decompress EV.  *PVT: Repeat CT demonstrating non-obstructive thrombus in main PV/L PV with extension into splenic vein.  No anticoagulation at this time and nonobstructive flows during TIPS placement.   *Ascites: No SBP on dx para 11/10. Cont cipro ppx. Low salt diet. S/p TIPS as noted above.   *HE: On rifaximin and lactulose, titrate to 3-4 BM's per day  *HCC: Imaging  from 06/2014 without HCC. Serial imaging for IPMN    DWA Kuo     , MD  [facility]

## 2014-07-04 NOTE — Plan of Care (Signed)
Problem: Pain - Acute  Goal: Control of acute pain  Outcome: Met  Patient denied pain on initial assessment and still with no c/o pain at this time.    Problem: Breathing Pattern - Ineffective  Goal: Respiratory rate, rhythm and depth return to baseline  Outcome: Met  Patient with no sob, lung sounds clear but diminished at the bases, O2 saturation 100% on O2 at 2 liters per nasal cannula.    Problem: Mobility - Impaired  Goal: Able to ambulate within specified parameters  Outcome: Not Met  Patient had ambulated with PT with FWW during the previous shift but per notes she easily gets fatigued.    Problem: Falls, Risk of  Goal: Keep patient free from falls utilizing universal fall precautions  Outcome: Met  Patient's bed kept at the lowest position and call light placed within her reach, instructed to call for assistance as needed. Patient's family members were at bedside earlier this shift and one member stayed at bedside to assist her overnight. No fall/injury noted.    Problem: Discharge Planning  Goal: Participation in care planning  Outcome: Not Met  No plan for discharge at this time as patient is not yet stable.    Problem: Fluid Volume Excess  Goal: Absence of edema; peripheral and/or pulmonary  Outcome: Not Met  Patient with no peripheral edema but abdomen is distended and lung sounds diminished. On Lasix 20 mg iv daily as ordered.    Problem: Alteration in Blood Glucose  Goal: Glucose level within specified parameters  Outcome: Not Met  Patient's fingerstick blood sugar level at bedtime was 152 mg/dl, no insulin coverage required per sliding scale ordered.    Problem: Nausea/Vomiting  Goal: Absence of nausea  Outcome: Not Met  Patient on routine Zofran 4 mg iv every 6 hours as ordered, still c/o nausea and had poor appetite.

## 2014-07-04 NOTE — Discharge Summary (Signed)
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       Additional Hospital Diagnoses ("rule out" or "suspected" diagnoses, etc.):  None    Principal Procedure During This Hospitalization (required):  CT imaging of abdo    Other Procedures Performed During This Hospitalization (required):  Ultrasound of abdo    Procedure results are available in Chart Review in Epic.  For those providers external to [place], the key procedure results are listed below:      abdo CT:       IMPRESSION:  CT scan of the abdomen and pelvis with and without IV contrast    1. 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 Additional extension to the splenic vein.   Hepatic veins are patent.    2. Sequelae of portal hypertension including splenomegaly, paraesophageal,   rectal, perisplenic, and perivertebral varices. Moderate ascites.    3. No reportable LI-RADS lesions.    4. 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    abdo sono:      IMPRESSION:  1. Large right pleural effusion.    2. Cirrhotic liver with sequela of portal hypertension including ascites and   splenomegaly.    3. 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 The   right and  left portal veins remain patent with flow in the proper direction.    4. 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      Consultations Obtained During This Hospitalization:  Endocrinology  Hepatology  Interventional Radiology    Key consultant recommendations:    Liver :    73 yo hispanic female HCV GT 2 cirrhosis (achieved SVR) fairly well compensated. 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     #R pleural effusion: Repeat thoracentesis c/w hydrothorax. No signs of infection or new onset CHF at this time. Seems to be recurreing despite diuresis and salt restriction. S/p thora on 11/13 and 11/16. Small post-thora PTX, now resolved. CXR worsening today.    --s/p TIPS 11/18 PSG 22 => 7  --cont diuretics withlasix 20mg /spiro 50mg   --wean O2 as tolerated  --low Na diet, nutrition following for calorie counts. Encourage increased PO intake    #AKI: Likely related to fluid shifts from thoracentesis and diuretics    --attempting gentle diuresis as noted above while mont Cr closely  --  strict IOs, daily wts  --add back if albumin < 3    #HCV GT2 cirrhosis with SVR c/b EV/ascites/hydrothorax: MELD 17. Relative stable disease. TTE late positive bubble for shunt.     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 BB stopped in setting of bradycardia, TIPS placed for HHT and should decompress EV.  *PVT: Repeat CT demonstrating non-obstructive thrombus in main PV/L PV with extension into splenic vein. No anticoagulation at this  time and nonobstructive flows during TIPS placement.   *Ascites: No SBP on dx para 11/10. Cont cipro ppx. Low salt diet. S/p TIPS as noted above.   *HE: On rifaximin and lactulose, titrate to 3-4 BM's per day  *HCC: Imaging from 06/2014 without HCC. Serial imaging for IPMN          IR:      Assessment and Plan/Recommendations:  POD#2 following TIPS for hepatic hydrothorax. Increase in Tbili up to 2.98 (peak) now trending down to 2.31. Also slight elevation in Cr from 1.14 to 1.39.   - Continue to trend T.bili to peak  - monitor for encephalopathy  - ultrasound to assess TIPS patency in 1 month, IR clinic to follow.           Reason for Admission to the Hospital / History of Present Illness:    " Paula Manning is a 73 year old spanish speaking only female with PMHx of HepC cirrhosis s/p treatment with achievement of SVR, DM, HTN who presents to the ED for shortness of breath that began 5 days ago and progressed to the point where she was dyspneic with minimal exertion. +2 pillow orthopnea. Denies PND. +LE edema. Reports that she is taking her diuretics, hasn't missed any doses. Denies cough, fevers, chills. States appetite is unchanged but has been eating out "a lot" including salty foods. No abdominal pain. Denies abdominal distention. +nausea, chronic and unchanged. No emesis. No diarrhea or constipation, no hematochezia or melena. Endorses dysuria over the past two days, +urinary urgency and frequency. No hematuria. Reports pleuritic chest pain that occurred five days ago, currently chest pain free." from Dr [facility] Course by Problem (required):    #. 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 Cr stable from yesterday   -continue lasix 40 mg po daily /aldactone 50 mg po daily     #. Hep C Cirrhosis: s/p treatment of Hep C with SVR achieved   Ascites-s/p TIPS. Diuretics per above   HE-lactulose TID (titrated to 3-4BM), rifaximin   EV-sm distal EV 5/13: propanolol dc'd at last admission 2/2 bradycardia   Continue cipro for SBP ppx   HCC: CT done 06/27/14     #. DM: Endo out pt recs    Januvia 50 mg po daily    Lantus 20 units sq daily    Glipizide 2.5 mg  po bid        Tests Outstanding at Discharge Requiring Follow Up:  None    Discharge Condition (required):  Improved.    Key Physical Exam Findings at Discharge:  No significant physical examination findings at the time of discharge.    Discharge Diet:  Low-salt.    Discharge Medications:     What To Do With Your Medications      START taking these medications       Add'l Info    lactulose 10 GM/15ML solution   Take 30 mLs (20 g) by mouth 3 times daily.    Quantity:  946 mL   Refills:  0       sitagliptin 50 MG tablet   Commonly known as:  JANUVIA   Take 1 tablet (50 mg) by mouth daily.    Quantity:  60 tablet   Refills:  1         CHANGE how you take these medications       Add'l Info    glipiZIDE 5 MG tablet   Commonly known as:  GLUCOTROL   Take 0.5 tablets (2.5 mg) by mouth 2 times daily.    Quantity:  30 tablet   Refills:  1   What changed:    - medication strength  - how much to take       insulin glargine 100 UNIT/ML injection   Commonly known as:  LANTUS   Inject 20 Units under the skin daily.    Quantity:  10 mL   Refills:  1   What changed:    - how much to take  - when to take this       spironolactone 50 MG tablet   Commonly known as:  ALDACTONE   Take 1 tablet (50 mg) by mouth daily.    Quantity:  30 tablet   Refills:  0   What changed:  how much to take         CONTINUE taking these medications       Add'l Info    alendronate 70 MG tablet   Commonly known as:  FOSAMAX    Refills:  0       ALPHAGAN  P 0.1 % ophthalmic solution   Generic drug:  brimonidine    Refills:  0       bacitracin 500 UNIT/GM ointment   Apply small amount to affected area on face twice daily    Quantity:  1 Tube   Refills:  1       Calcium Carbonate 600 MG tablet   1 tablet 2 times daily    Quantity:  one month   Refills:  11       ciprofloxacin 500 MG tablet   Commonly known as:  CIPRO   Take 1 tablet by mouth daily.    Quantity:  30 tablet   Refills:  5       furosemide 20 MG tablet   Commonly known as:  LASIX   Take 2 tablets by mouth daily.    Quantity:  90 tablet   Refills:  0       LUMIGAN 0.01 %   Generic drug:  bimatoprost    Refills:  0       multivitamin tablet   Take 1 tablet by mouth daily.    Refills:  0       ranitidine 300 MG tablet   Commonly known as:  ZANTAC  Take 300 mg by mouth 2 times daily.    Refills:  0       vitamin D 400 UNITS capsule   1 CAPSULE DAILY    Quantity:  30   Refills:  11       XIFAXAN 550 MG tablet   Take 550 mg by mouth every 12 hours.   Generic drug:  rifaximin    Refills:  0         Where to Get Your Medications     You need to pick up these prescriptions. We sent them to a specific pharmacy, so go there to get them.           [facility]   -  glipiZIDE 5 MG tablet   -  insulin glargine 100 UNIT/ML injection   -  lactulose 10 GM/15ML solution   -  sitagliptin 50 MG tablet   -  spironolactone 50 MG tablet    [address]   [place] [address]   Phone:  [phone]   Hours:  Mon-Fri 9am-7:00pm, Sat-Sun 9am-5:00pm                      Allergies:  No Known Allergies    Discharge Disposition:  Home.    Discharge Code Status:  Full code / full care  This code status is not changed from the time of admission.    Follow Up Appointments:    Scheduled appointments:  Future Appointments  Date Time Provider [place]   07/18/2014 8:30 AM Rosalva Ferron, NP MOS TXP HEP MOS       For appointments requested for after discharge that have not yet  been scheduled, refer to the Post Discharge Referrals section of the After Visit Summary.    Discharging 55 Contact Information:  [facility] operator at [phone].    .I have seen the patient on the day of discharge and spent over 30 minutes on discharge.    Marney Setting, MD

## 2014-07-04 NOTE — Plan of Care (Addendum)
Problem: Discharge Planning  Goal: Participation in care planning  Outcome: Met  Discharge instructions given to granddaughter who speaks and understands English and she interprets for the pt :re follow up appointment on Dec 8 at hepatology clinic also They needs to call IR for TIPS follow up appointment in 4 weeks.telephone number provided.cont home medications which have not change,pharmacy brought medications to bedside and spoke to grand daughter;when to call 911 or seek medical care like SOB or altered mental status;diet is low salt,check fingersticks. And cover with insulin.verbalized understanding. Iv to [place] removed with intact catheter tip. Arms has a lot of ecchymosis. L fa with mepilex (prev. Iv site)transport arranged.MD ordered 3 in 1 commode and case manager says it will be delivered in their house by [employer].

## 2014-07-04 NOTE — Interdisciplinary (Signed)
07/04/14 1600   Discharge Planning Needs   Does this patient have CM discharge planning needs? Yes   Transfer Contact Information   Primary family / caregiver name and contact information: Pls contact [facility] to follow up status of 3:1 commode if it's not delivered to your home tonight. Family verbalized understanding.   Care Coordination Documentation   Durable Medical Equipment (DME): 3 in 1 commode  (Requested  company to deliver 3:1 commode to  home today.)   [place]: [place]  [phone]   (will deliver 3:1 commode pending authorization.)   Met with patient and daughter at bedside, made aware of above , daughter verbalized understanding.   Primary RN Gelene updated.

## 2014-07-04 NOTE — Interdisciplinary (Signed)
Pt ambulated in the hallway using FWW with staff, measured oxygen saturation 94% RA, documented and relayed results to hepatology fellow. Pt denies SOB. Back to bed, call light within reach.

## 2014-07-04 NOTE — Progress Notes (Addendum)
Endocrine/Diabetes Progress Note    Overnight: no acute events    Subjective: still has slight nausea but is holding down some food, and always able to hold down glucerna    Objective:  Temperature:  [97.8 F (36.6 C)-98.3 F (36.8 C)] 98.1 F (36.7 C) (11/21 0732)  Blood pressure (BP): (107-126)/(35-47) 112/42 mmHg (11/21 0732)  Heart Rate:  [79-84] 80 (11/21 0732)  Respirations:  [13-20] 18 (11/21 0732)  Pain Score:  [-] 0 (11/21 0732)  O2 Device:  [-] None (Room air) (11/21 1454)  SpO2:  [92 %-97 %] 95 % (11/21 1454)  Body mass index is 21.71 kg/(m^2).    Diet: CHO Limited + Glucerna tid    Blood Sugars reviewed:      AM Lu Di  HS O/N      11.15.15 147 273 123  84  Lantus   26  Lispro  8+4 10+8+6  10       11.19  172 283 344  245  Lantus   26  Lispro  6+3 6+8 6+10  +5    11.20  205 358 438  239  Lantus   26  Lispro  5+3 7+10 14+7+10 7    11.21  215 315 179 202  Lantus   30  Lispro  9+5 9+10 9+4 0    11.22  171 221 209 214  Lantus   30  Lispro  9+3 5 9+5 +4     11.23  137 237 207 152   Lantus   30  Lispro  9 9+6 11+5 0    11.24  102 174   Lantus     Lispro  11 11+3              A/P:  Paula Manning is a 73 year old female w/ uncontrolled DM2 c/b neuropathy as well as HepC cirrhosis, HTN admitted 06/20/2014 w/ 5 days SOB and found to have R pleural effusion thought 2/2 dietary indiscretion.     BGs cont high lunch and after, glucerna coverage increased and bgs came down last nt and today at lunch.    Inpatient Recs:   -Cont Lantus to 30 units daily before lunch  -Cont Lispro  18 units TID with meals, RN to give 0, 1/2, whole dose based on % tray consumed  - Increase Lispro 9--->11 units TID AC, "FOR Glucerna COVERAGE, do not give if pt not drinking supplement."  - Cont High correction scale w lispro ac/hs    Outpatient Recs/Education:    1. Self-monitor blood glucose: has meter covered per insurance and strips    2. Healthy Eating: reviewed cho groups with family and pt, nurse to show Emmi videos    3.  Activity:per MD    4. Hypoglycemia: need to review    5. Medication: likely modify dosages, but same, oral and insulin.    6. Discharge regimen  -lantus 20 units daily in the am  -glipizide 2.5 mg po bid before taking at least 2 carb servings  -januvia 50mg  daily  -check bgs bid    7. Follow-up: f/u w/ PCP Dr. Rayburn Felt.    Will cont to follow

## 2014-07-04 NOTE — Discharge Instructions (Signed)
Diagnosis and Reason for Admission    You were admitted to the hospital for the following reason(s):  SOB; hepatic hydrothorax    Your full diagnosis list is located on this After Visit Summary in the Hospital Problems section.    What [facility] Stay    The main tests and treatments done for you during this hospitalization were:    Thoracentesis; TIPS; liver consult; diuresis    The following evaluation is still important to complete after discharge from the hospital:  F/u with IR in 1 month; f/u with liver in 1 week; please note you have a new diabetes regimen order; f/u with PMD in 1 wk    Instructions for After Discharge    Your diet at home should be a low-salt diet.    Your activity level at home should be:  regular activity.    Specific activity restrictions:    None    Wound or tube care instructions:  None    Your medication list is located on this After Visit Summary in the Current Discharge Medication List section.  Your nurse will review this information with you before you leave the hospital.    It is very important for you to keep a current medication list with you in order to assist your doctors with your medical care.  Bring this After Visit Summary with you to your follow up appointments.    Reasons to Contact a Doctor Urgently    Call 911 or return to the hospital immediately if:  severe SOB; altered mental status     You should contact either your primary care physician or your hospital physician for any of the following reasons: all other reasons    If you have any questions about your hospital care, your medications, or if you have new or concerning symptoms soon after going home from the hospital, and you need to contact your hospital physician, your hospital physician can be contacted in the following manner:  [facility] operator at [phone].    Once you are able to see your primary care physician (PCP), your PCP will then be responsible for further medication  refills, or appointment referrals.    What Needs to Happen Next After Discharge -- Appointments and Follow Up    Any appointments already scheduled at [facility] clinics will be listed in the Future Appointments section at the top of this After Visit Summary.  Any appointments that have been requested, but have not yet been scheduled, will be listed below that under Post Discharge Referrals.    Sometimes tests performed in the hospital do not yet have results by the time a patient goes home.  The following key tests will need to be followed up at your next appointment: None    Medical Home Information    Your primary care provider or clinic currently on file at [facility] is: Arlana Pouch.    Handouts Given to You (if applicable)

## 2014-07-04 NOTE — Plan of Care (Signed)
Problem: Pain - Acute  Goal: Control of acute pain  Outcome: Met  Denies any pain. Appears comfortable. Was able to ambulate in the hallways pushing walker.    Problem: Breathing Pattern - Ineffective  Goal: Respiratory rate, rhythm and depth return to baseline  Outcome: Met  No acute resp distress noted. Pt did not use oxygen since this morning. Sat on Ra 94%. Pt denies any SOB. Charge nurse Herbert Pun ambulated pt in the hallways with no oxygen and saturation checked after. Sat was still 94% .tolerated well.    Problem: Mobility - Impaired  Goal: Able to ambulate within specified parameters  Outcome: Met  Ambulates in the room to the bathroom. And also in the hallways once pushing her walker..Tolerated well.    Problem: Alteration in Blood Glucose  Goal: Glucose level within specified parameters  Outcome: Not Met  Fingerstick before lunch was 174,covered with 3 units of lispro. also pt drinking glucerna and covered with 11 units as ordered. Pt eating less than 50% of meals and nutritional insulin held.    Problem: Nausea/Vomiting  Goal: Absence of nausea  Outcome: Met  No complain of nausea or vomiting today. Still on zofran iv every 6 hours. Still not eating very much but she is drinking glucerna every meal.

## 2014-07-05 ENCOUNTER — Encounter (HOSPITAL_BASED_OUTPATIENT_CLINIC_OR_DEPARTMENT_OTHER): Payer: Self-pay | Admitting: Vascular & Interventional Radiology

## 2014-07-05 DIAGNOSIS — Z95828 Presence of other vascular implants and grafts: Secondary | ICD-10-CM

## 2014-07-05 DIAGNOSIS — K7460 Unspecified cirrhosis of liver: Secondary | ICD-10-CM

## 2014-07-05 DIAGNOSIS — I8511 Secondary esophageal varices with bleeding: Principal | ICD-10-CM

## 2014-07-12 NOTE — Interdisciplinary (Signed)
PHYSICAL THERAPY DISCHARGE SUMMARY    Discharge Information  Diagnosis: Difficulty in walking (dec activity tolerance ) 719.7  Discharge Date: 07/04/14  Assessment: Pt seen for initial PT eval only during hospital stay. No goals attempted nor met. Pt DCed home with family.    Goals Met: not met  Goal Impairment: G2563: Mobility  Goal Impairment %: CI: 1-19% impaired, limited or restricted  Discharge Impairment: G8980: Mobility  Discharge Impairment %: CJ: 20-39% impaired, limited or restricted  Rationale: Clinical judgement  Destination : Home

## 2014-07-18 ENCOUNTER — Ambulatory Visit: Payer: Medicare Other | Attending: Internal Medicine | Admitting: Family

## 2014-07-18 ENCOUNTER — Other Ambulatory Visit: Payer: Medicare Other | Attending: Family

## 2014-07-18 VITALS — BP 129/66 | HR 74 | Temp 97.7°F | Resp 16 | Ht 61.0 in | Wt 112.1 lb

## 2014-07-18 DIAGNOSIS — R188 Other ascites: Secondary | ICD-10-CM

## 2014-07-18 DIAGNOSIS — K7460 Unspecified cirrhosis of liver: Secondary | ICD-10-CM | POA: Insufficient documentation

## 2014-07-18 DIAGNOSIS — E1165 Type 2 diabetes mellitus with hyperglycemia: Secondary | ICD-10-CM | POA: Insufficient documentation

## 2014-07-18 DIAGNOSIS — Z9189 Other specified personal risk factors, not elsewhere classified: Secondary | ICD-10-CM

## 2014-07-18 DIAGNOSIS — Z87898 Personal history of other specified conditions: Secondary | ICD-10-CM | POA: Insufficient documentation

## 2014-07-18 DIAGNOSIS — D490 Neoplasm of unspecified behavior of digestive system: Secondary | ICD-10-CM | POA: Insufficient documentation

## 2014-07-18 DIAGNOSIS — IMO0002 Reserved for concepts with insufficient information to code with codable children: Secondary | ICD-10-CM

## 2014-07-18 DIAGNOSIS — K769 Liver disease, unspecified: Secondary | ICD-10-CM | POA: Insufficient documentation

## 2014-07-18 DIAGNOSIS — J918 Pleural effusion in other conditions classified elsewhere: Secondary | ICD-10-CM | POA: Insufficient documentation

## 2014-07-18 DIAGNOSIS — E1140 Type 2 diabetes mellitus with diabetic neuropathy, unspecified: Principal | ICD-10-CM | POA: Insufficient documentation

## 2014-07-18 LAB — CBC WITH DIFF, BLOOD
ANC-Automated: 3 10*3/uL (ref 1.6–7.0)
Abs Eosinophils: 0.1 10*3/uL (ref 0.1–0.7)
Abs Lymphs: 0.8 10*3/uL (ref 0.8–3.1)
Abs Monos: 0.5 10*3/uL (ref 0.2–0.8)
Basophils: 1 % (ref 0–2)
Eosinophils: 2 % (ref 1–4)
Hct: 32.1 % — ABNORMAL LOW (ref 34.0–45.0)
Hgb: 11.5 gm/dL (ref 11.2–15.7)
Lymphocytes: 18 % — ABNORMAL LOW (ref 19–53)
MCH: 33.1 pg — ABNORMAL HIGH (ref 26.0–32.0)
MCHC: 35.8 % (ref 32.0–36.0)
MCV: 92.5 um3 (ref 79.0–95.0)
MPV: 9.8 fL (ref 9.4–12.4)
Monocytes: 11 % (ref 5–12)
Plt Count: 57 10*3/uL — ABNORMAL LOW (ref 140–370)
RBC: 3.47 10*6/uL — ABNORMAL LOW (ref 3.90–5.20)
RDW: 17.5 % — ABNORMAL HIGH (ref 12.0–14.0)
Segs: 68 % (ref 34–71)
WBC: 4.4 10*3/uL (ref 4.0–10.0)

## 2014-07-18 LAB — ALPHA FETOPROTEIN, BLOOD: AFP: 1.5 ng/mL (ref 0.0–8.3)

## 2014-07-18 LAB — COMPREHENSIVE METABOLIC PANEL, BLOOD
ALT (SGPT): 26 U/L (ref 0–33)
AST (SGOT): 34 U/L — ABNORMAL HIGH (ref 0–32)
Albumin: 3.8 g/dL (ref 3.5–5.2)
Alkaline Phos: 108 U/L (ref 35–140)
Anion Gap: 15 mmol/L (ref 7–15)
BUN: 16 mg/dL (ref 6–20)
Bicarbonate: 23 mmol/L (ref 22–29)
Bilirubin, Tot: 3.44 mg/dL — ABNORMAL HIGH (ref <1.20)
Calcium: 9.4 mg/dL (ref 8.5–10.6)
Chloride: 102 mmol/L (ref 98–107)
Creatinine: 0.81 mg/dL (ref 0.51–0.95)
GFR: >60 mL/min
Glucose: 176 mg/dL — ABNORMAL HIGH (ref 70–115)
Potassium: 3.7 mmol/L (ref 3.5–5.1)
Sodium: 140 mmol/L (ref 136–145)
Total Protein: 7.4 g/dL (ref 6.0–8.0)

## 2014-07-18 LAB — PROTHROMBIN TIME, BLOOD
INR: 1.6
PT,Patient: 17 s — ABNORMAL HIGH (ref 9.7–12.5)

## 2014-07-18 NOTE — Patient Instructions (Addendum)
1. Labs today    2. Daily weights. If weight is increasing call nurse    3. Continue with low sodium diet <2,000 mg    4. Confirm pending EGD appointment    As a reminder, please do not go to the lab without confirming lab orders are in the computer for you. If you have any questions you may call the lab at [phone].    If you have any questions or concerns please do not hesitate to call your care team at [phone].     Thank you,    Jeanette Caprice, MD  Associate Professor of Medicine  Director of [place], NP  Hepatology Nurse Practitioner  [place]    Earnest Conroy, [place]  Hepatology Nurse: refill medications or medical questions  [place]  D. Line [place] Assistant: appointments, procedures or insurance issues  [place]  D. Line [phone]

## 2014-07-18 NOTE — Progress Notes (Signed)
July 18, 2014    Location: [facility]     Hepatologist: Jeanette Caprice, MD    Visit Provider: Orlean Patten, NP     Chief Complaint   Patient presents with    Cirrhosis      Reason for visit: Hospital discharge        HPI:  Paula Manning is a 73 year old female with HCV (achieved SVR) cirrhosis. She was last seen in this clinic 04/2014. In the interim she was admitted 11/10-11/24/2015 secondary to dyspnea and hepatic hydrothorax. She had a TIPS 11/18 with Dr. Ladell Heads. Since discharge she denies any dyspnea, increased abdominal girth or peripheral edema.     She is here today with her son Paula Manning. She reports her weight has been stable since discharge.    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    At risk for cancer: [facility] 06/23/2013    Acute dyspnea 12/15/2012    CAP (community acquired pneumonia) 11/30/2012    Hypoxia 11/30/2012    Nasal bone fx-closed 10/17/2011    Syncope 10/17/2011    Status post thoracentesis 12/28/2010    Pleural effusion associated with hepatic disorder 12/27/2010    Fracture of proximal humerus 08/14/2009    Hypertensive disorder 12/12/2008    Hyperlipidemia 12/12/2008    Microalbuminuria 12/12/2008    Osteoporosis 07/26/2007    Ascites 06/29/2007     History of paracentesis x 2      Pancreatic cyst 06/29/2007     Likely intraductal papillary mucinous tumor of the pancreas (IPMT)  07/2004 EUS ([facility]) Benign appearing pancreatic neck cyst measuring 20 mm x 10 mm with internal septation.  07/2007 EUS [facility]) Multiple pancreatic cysts throughout the pancreatic body. The largest cyst measured 20 mm x 14 mm. No associated masses  were seen to suggest malignancy. The ampulla appeared normal. 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  Blood type of O positive.  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 Due to Hep C    Osteopenia 06/24/2007    Emphysema 06/24/2007    EV (esophageal varices) 06/10/2007    Anemia 06/10/2007  HCV (hepatitis C virus) 06/10/2007     Achieved SVR.    Type II or unspecified type diabetes mellitus with peripheral circulatory disorders, not stated as uncontrolled(250.70) 06/10/2007    HTN 06/10/2007    HTN 06/10/2007       No Known Allergies    Current Outpatient Prescriptions on File Prior to Visit   Medication Sig Dispense Refill    alendronate (FOSAMAX) 70 MG tablet       ALPHAGAN P 0.1 % ophthalmic solution       bacitracin 500 UNIT/GM ointment Apply small amount to affected area on face twice daily 1 Tube 1    CALCIUM CARBONATE 600 MG OR TABS 1 tablet 2 times daily one month 11    ciprofloxacin (CIPRO) 500 MG tablet Take 1 tablet by mouth daily. 30 tablet 5    furosemide (LASIX) 20 MG tablet Take 2 tablets by mouth daily. 90 tablet 0    glipiZIDE (GLUCOTROL) 5 MG tablet Take 0.5 tablets (2.5 mg) by mouth 2  times daily. 30 tablet 1    insulin glargine (LANTUS) 100 UNIT/ML injection Inject 20 Units under the skin daily. 10 mL 1    lactulose 10 GM/15ML solution Take 30 mLs (20 g) by mouth 3 times daily. 946 mL 0    LUMIGAN 0.01 %       multivitamin (MULTI-VITAMIN) tablet Take 1 tablet by mouth daily.      ranitidine (ZANTAC) 300 MG tablet Take 300 mg by mouth 2 times daily.      rifaximin (XIFAXAN) 550 MG tablet Take 550 mg by mouth every 12 hours.      sitagliptin (JANUVIA) 50 MG tablet Take 1 tablet (50 mg) by mouth daily. 60 tablet 1    spironolactone (ALDACTONE) 50 MG tablet Take 1 tablet (50 mg) by mouth daily. 30 tablet 0    VITAMIN D 400 UNIT OR CAPS 1 CAPSULE DAILY 30 11     No current facility-administered medications on file prior to visit.       History     Social History    Marital Status: Married     Spouse Name: N/A     Number of Children: N/A    Years of Education: N/A     Occupational History    retired      Social History Main Topics    Smoking status: Former Smoker -- 10 years     Quit date: 11/13/2012    Smokeless tobacco: Never Used    Alcohol Use: No    Drug Use: No    Sexual Activity: Not on file     Other Topics Concern    Not on file     Social History Narrative    No narrative on file        No family history on file.     Past Surgical History   Procedure Laterality Date    Pb laps surg cholecstc w/expl common duct  1970s     in [place];  open [place]    Pb cesarean delivery only      Pb cstoplasty/cstourtp plstc any       Bladder suspension    Egd procedure  06/01/2013     Procedure: GI EGD;  Surgeon: Jeanette Caprice, MD;  Location: [facility];  Service: Gastroenterology;;           Review of Systems -  Constitutional: no fever or weight gain  Eyes: no vision changes  HENT: no  headaches, nose bleeds or oral lesions  Cardiovascular: no chest pain or peripheral edema  Respiratory: no SOB, dyspnea or hemoptysis  Gastrointestinal: no  nausea, vomiting, or hematemesis. No  abdominal distention, + abdominal hernia  Genitourinary: no dysuria  Musculoskeletal: no pain or swelling of muscles or joints  Integumentary: no rashes or jaundice  Neurological: no tremors or difficulties with memory  Psychiatric: Negative  Endocrine: no cold/heat intolerance, no excessive sweating or polyuria  Hematologic/Lymphatic: no easy bruising or bleeding  Allergic/Immunologic: negative, see allergy list        Physical Exam: (9 systems examined)     Vital Signs: BP 129/66 mmHg   Pulse 74   Temp(Src) 97.7 F (36.5 C) (Oral)   Resp 16   Ht _0  (1.549 m)   Wt 50.831 kg (112 lb 1 oz)   BMI 21.18 kg/m2   SpO2 100%     BMI: Body mass index is 21.18 kg/(m^2).     Systems:  GEN: appears frail, in no apparent distress using a walker. Alert and oriented times three, pleasant and cooperative.  HEENT: no sclera icterus, O/P: mmm no lesions  Lymph nodes:  negative lymph nodes  Pulmonary: moves air well bilaterally  Cardiovascular: regular rate & rhythm,  no murmurs, gallops or rubs, <1+  peripheral edema  Gastrointestinal: BS x4, soft NTND, liver and spleen not palpable, no ascites, abdominal scar RUQ s/p cholecystectomy, + reducible RUQ hernia  Rectal and genitalia: Not examined  Neurologic including mental status: Alert & oriented x3. No encephalopathy or asterixis  Dermatologic:no jaundice, rash, or spiders     Laboratory:     Lab Results   Component Value Date    BUN 30 07/04/2014    CREAT 0.99 07/04/2014    CL 98 07/04/2014    NA 136 07/04/2014    K 3.9 07/04/2014    [place] 9.0 07/04/2014    TBILI 2.60 07/04/2014    ALB 3.3 07/04/2014    TP 5.8 07/04/2014    AST 35 07/04/2014    ALK 96 07/04/2014    BICARB 27 07/04/2014    ALT 31 07/04/2014    GLU 82 07/04/2014     Lab Results   Component Value Date    WBC 4.4 07/18/2014    RBC 3.47 07/18/2014    HGB 11.5 07/18/2014    HCT 32.1 07/18/2014    MCV 92.5 07/18/2014    MCHC 35.8 07/18/2014    RDW 17.5 07/18/2014    PLT 57 07/18/2014    PLT 47 04/23/2009    MPV 9.8  07/18/2014    SEG 68 07/18/2014    SEG 70 05/18/2008    LYMPHS 18 07/18/2014    LYMPHS 16 05/18/2008    MONOS 11 07/18/2014    MONOS 6 05/18/2008    EOS 2 07/18/2014    EOS 1 05/18/2008    BASOS 1 07/18/2014    BASOS 1 06/03/2004     Lab Results   Component Value Date    AFP 1.3 01/13/2014    AFP 3 01/25/2008     Lab Results   Component Value Date    AFPL < 0.5 09/16/2013     Lab Results   Component Value Date    DCP 3.3 09/16/2013     Lab Results   Component Value Date    PT 17.0 07/18/2014     Lab Results   Component Value Date    INR 1.6 07/18/2014  Lab Results   Component Value Date    HEPAAB REACTIVE 01/25/2008    HEPAABIGM NonReactive 01/25/2008    HEPBCORABIGG NonReactive 01/25/2008    HEPBCORABIGM NonReactive 01/25/2008    HEPBSURFABQT <1 01/25/2008    HEPCGENOTYP CANCEL 09/06/2003    [place] Not Detected 04/12/2014    HEPBSURFACAG NonReactive 01/25/2008       Endoscopy:   Endoscopic Diagnosis: 05/2013  1. Midline orophyarngeal   prominence at the arytenoid   2. Three columnns of grade 1 esophageal varices, no high   risk features   3. No gastric varices, mild portal hypertensive gastropathy   4. Few punctate clots in the antrum   5. 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 Continue propranolol 10 BID   2. ENT consultation to evaluate oropharyngeal findings   3. Repeat EGD in 1 year for varices screening     Imaging:  CT scan of the abdomen and pelvis with and without IV contrast: 06/27/2014    1. 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 Additional extension to the splenic vein.   Hepatic veins are patent.    2. Sequelae of portal hypertension including splenomegaly, paraesophageal,   rectal, perisplenic, and perivertebral varices. Moderate ascites.    3. No reportable LI-RADS lesions.    4. No other changes since prior MR abdomen, including the multiple pancreatic   cysts likely representing  multiple IPMNs.        I have reviewed all lab documents in media and imaging and procedures in EPIC    History: She was admitted 3/24-3/28/2015 for GBS bacteremia. Unclear source. UA negative, CXR negative. Did have some diarrhea preceding this so perhaps translocation. No abd pain to suggest diverticulitis. She had a  PICC for 2 week course of CTX . She was prescribed Cipro 500 mg for SBP prophylaxis for 1 month and discontinued. Admitted 11/10-11/24/2015 for dyspnea and hepatic  hydrothorax. Underwent TIPs 11/18 PSG 22 --> 7. Admission complicated by AKI. Plan was gentle diuresis with spironolactone 50 + furosemide 20 mg     Assessment and Plan: 73 yo hispanic female HCV GT 2 cirrhosis (achieved SVR)  fairly well compensated. EUS and several CT scans since 2005 are consistent with branch-type IPMN. She was seen by Dr. Allen Norris 02/2014 secondary slowly increasing pancreatic cysts (max 3cm). He felt she is low risk for cancer and very high risk for endoscopic complications. His recommendation was to continue to monitor with imaging.       1) Cirrhosis: MELD 14; CPT B-7    2) CHC GT 2: achieved SVR  - HCV PCR negative (04/2014)    3) HCC surrveillance: Imaging from 12/2013  - no HCC; stable IPMN from 07/2013 but increased in size since 2003  - AFP 1.3 (01/2014)  - repeat surveillance in 07/2014. Radiology recommend extracellular agent to evaluate IPMN    4) EV surveillance: small EV. Remote historyy of banding ~ 14 years ago. No h/o UGI bleeding  - grade I EV.   - repeat surveillance ordered for 05/2014    5) Ventral hernia:   - abdominal binder  - evaluated by surgeon and declined for repair  - advised her to call or go to the ER if she develops pain with nausea, vomiting or fever in case the hernia is strangulated    6) Fluid retention: resolved  - reinforced low sodium diet <2,000 mg, avoiding  processed food and limit eating out  - discharge diuretics spironolactone 50 mg + furosemide 20 mg daily. Patient has  furosemdie 40 mg  tablets and has been taking them twice daily. She had AKI while inpatient and gentle diuresis was recommened. Will decrease lasix to 40 mg daily. CMP ordered today  - monitor weight. 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 Advised to schedule    9)  Nutrition:  - 2,000 low sodium diet  - previous spanish handouts given at prior appointment. Recommend Mrs Deliah Boston salt free for flavoring    10) Midline orophyarngeal prominence at the arytenoid (seen on EGD)  - evaluated by ENT 07/19/2013: large pharyngeal prominence represents a large osteophyte. No swallowing issues. Follow up with ENT prn    11) Return to clinic: 3 months (labs prior to appointment)    Advise patient and son Paula Manning to call hepatology nurse to reconcile medications.     Case discussed with Dr. Andreas Ohm. Advised to cancel upcoming EGD (s/p TIPS).        Orlean Patten, NP

## 2014-07-18 NOTE — Interdisciplinary (Signed)
915 Written and verbal instructions  given to patient. Verbalized  Understanding. D/C ambulatory,with family,  in no apparent distress.   Patient Education  Identified learning needs: Medication,uses,dose,side effects.  Learner: patient and family member   Barriers to learning: none  Readiness to learn: acceptance  Method: Explanation and handout.  Treatment education given: EGD letter and instructions given to patient  Fall prevention education given: N/A  Pain education given: N/A  Response: Verbalized understainding

## 2014-07-27 ENCOUNTER — Ambulatory Visit: Payer: Medicare Other | Attending: Gastroenterology | Admitting: Gastroenterology

## 2014-07-27 ENCOUNTER — Encounter (HOSPITAL_BASED_OUTPATIENT_CLINIC_OR_DEPARTMENT_OTHER): Payer: Self-pay | Admitting: Gastroenterology

## 2014-07-27 VITALS — BP 121/67 | HR 77 | Temp 97.5°F | Resp 16 | Ht 61.0 in | Wt 111.8 lb

## 2014-07-27 DIAGNOSIS — Z95828 Presence of other vascular implants and grafts: Secondary | ICD-10-CM | POA: Insufficient documentation

## 2014-07-27 DIAGNOSIS — K7469 Other cirrhosis of liver: Secondary | ICD-10-CM | POA: Insufficient documentation

## 2014-07-27 DIAGNOSIS — IMO0002 Reserved for concepts with insufficient information to code with codable children: Secondary | ICD-10-CM

## 2014-07-27 DIAGNOSIS — E1165 Type 2 diabetes mellitus with hyperglycemia: Secondary | ICD-10-CM | POA: Insufficient documentation

## 2014-07-27 DIAGNOSIS — E1142 Type 2 diabetes mellitus with diabetic polyneuropathy: Secondary | ICD-10-CM | POA: Insufficient documentation

## 2014-07-27 DIAGNOSIS — K769 Liver disease, unspecified: Secondary | ICD-10-CM | POA: Insufficient documentation

## 2014-07-27 DIAGNOSIS — E1140 Type 2 diabetes mellitus with diabetic neuropathy, unspecified: Secondary | ICD-10-CM | POA: Insufficient documentation

## 2014-07-27 DIAGNOSIS — D490 Neoplasm of unspecified behavior of digestive system: Secondary | ICD-10-CM | POA: Insufficient documentation

## 2014-07-27 DIAGNOSIS — J918 Pleural effusion in other conditions classified elsewhere: Secondary | ICD-10-CM | POA: Insufficient documentation

## 2014-07-27 NOTE — Interdisciplinary (Signed)
940 Written and verbal instructions  given to patient. Verbalized  Understanding. D/C ambulatory,with family,  in no apparent distress.   Patient Education  Identified learning needs: Medication,uses,dose,side effects.  Learner: patient and family member   Barriers to learning: none  Readiness to learn: acceptance  Method: Explanation and handout.  Treatment education given: None  Fall prevention education given: N/A  Pain education given: N/A  Response: Verbalized understainding

## 2014-07-27 NOTE — Patient Instructions (Signed)
--   please give patient the phone number for radiology scheduling so she can schedule her repeat abdominal doppler ultrasound to assess transjugular intrahepatic portosystemic shunt patency before her next appointment with IR on 08/30/2014  -- Return in 3 months with labs before

## 2014-07-27 NOTE — Progress Notes (Signed)
[facility]  ATTENDING: Jeanette Caprice, M.D.  DATE OF SERVICE: 07/27/2014    REASON FOR VISIT: follow-up after transjugular intrahepatic portosystemic shunt placement on 06/28/2014    Patient Active Problem List   Diagnosis    Anemia    EV (esophageal varices)    Seizure Disorder    DM circ dis type II    Viral hepatitis C    Hypertensive disorder    Cirrhosis of liver    Emphysema    Ascites    Pancreatic cyst    Osteoporosis    Hypertensive disorder    Hyperlipidemia    Microalbuminuria    Fracture of proximal humerus    Pleural effusion associated with hepatic disorder    Status post thoracentesis    Nasal bone fx-closed    Syncope    CAP (community acquired pneumonia)    Hypoxia    Acute dyspnea    IPMN (intraductal papillary mucinous neoplasm)    At risk for cancer: HCC    Group B streptococcal infection    SOB (shortness of breath)    Type 2 diabetes, uncontrolled, with neuropathy    Diabetic polyneuropathy associated with type 2 diabetes mellitus    Metastatic adenocarcinoma       Current Outpatient Prescriptions   Medication Sig Dispense Refill    alendronate (FOSAMAX) 70 MG tablet       ALPHAGAN P 0.1 % ophthalmic solution       bacitracin 500 UNIT/GM ointment Apply small amount to affected area on face twice daily 1 Tube 1    CALCIUM CARBONATE 600 MG OR TABS 1 tablet 2 times daily one month 11    ciprofloxacin (CIPRO) 500 MG tablet Take 1 tablet by mouth daily. 30 tablet 5    furosemide (LASIX) 20 MG tablet Take 2 tablets by mouth daily. 90 tablet 0    glipiZIDE (GLUCOTROL) 5 MG tablet Take 0.5 tablets (2.5 mg) by mouth 2 times daily. 30 tablet 1    insulin glargine (LANTUS) 100 UNIT/ML injection Inject 20 Units under the skin daily. 10 mL 1    lactulose 10 GM/15ML solution Take 30 mLs (20 g) by mouth 3 times daily. 946 mL 0    LUMIGAN 0.01 %       multivitamin (MULTI-VITAMIN) tablet Take 1 tablet by mouth daily.      ranitidine (ZANTAC) 300 MG  tablet Take 300 mg by mouth 2 times daily.      rifaximin (XIFAXAN) 550 MG tablet Take 550 mg by mouth every 12 hours.      sitagliptin (JANUVIA) 50 MG tablet Take 1 tablet (50 mg) by mouth daily. 60 tablet 1    spironolactone (ALDACTONE) 50 MG tablet Take 1 tablet (50 mg) by mouth daily. 30 tablet 0    VITAMIN D 400 UNIT OR CAPS 1 CAPSULE DAILY 30 11     No current facility-administered medications for this visit.       ALLERGIES: No Known Allergies    PHYSICAL EXAM:  BP 121/67 mmHg   Pulse 77   Temp(Src) 97.5 F (36.4 C) (Oral)   Resp 16   Ht 5' 1"  (1.549 m)   Wt 50.712 kg (111 lb 12.8 oz)   BMI 21.14 kg/m2   SpO2 100%  General:  Alert and oriented x 3, conversant, pleasant and appropriate.  ENT: Oropharynx is clear, mucus membranes are moist.  Pulmonary:  Clear to auscultation bilaterally.  Cardiovascular: Regular rate and rhythm. No murmurs,  rubs, gallops.  GI: Abdomen soft, non-tender, non-distended.  Extremities: No lower extremity edema.    LABS:  HEP HEPATOLOGY Latest Ref Rng 07/18/2014 07/04/2014 07/03/2014 07/02/2014   WBC 4.0 - 10.0 1000/mm3 4.4 4.5 5.2 6.0   HGB 11.2 - 15.7 gm/dL 11.5 9.7 (L) 9.6 (L) 9.5 (L)   HCT 34.0 - 45.0 % 32.1 (L) 27.5 (L) 27.2 (L) 26.9 (L)   PLT 140 - 370 1000/mm3 57 (L) 43 (L) 51 (L) 49 (L)   INR  1.6 1.6 1.6 1.6   NA 136 - 145 mmol/L 140 136 134 (L) 127 (L)   K 3.5 - 5.1 mmol/L 3.7 3.9 4.4 4.3   CL 98 - 107 mmol/L 102 98 95 (L) 91 (L)   BI 22 - 29 mmol/L 23 27 26 24    BUN 6 - 20 mg/dL 16 30 (H) 39 (H) 44 (H)   CR 0.51 - 0.95 mg/dL 0.81 0.99 (H) 1.23 (H) 1.22 (H)   GLUC 70 - 115 mg/dL 176 (H) 82 130 (H) 158 (H)   AST 0 - 32 U/L 34 (H) 35 (H) 31 37 (H)   ALT 0 - 33 U/L 26 31 31  36 (H)   TBILI <1.20 mg/dL 3.44 (H) 2.60 (H) 2.72 (H) 3.23 (H)   DBILI <0.2 mg/dL  0.8 (H) 0.8 (H) 0.8 (H)   ALK PHOS 35 - 140 U/L 108 96 89 87   ALB 3.5 - 5.2 g/dL 3.8 3.3 (L) 3.4 (L) 3.8   MAG 1.7 - 2.6 mg/dL  2.4 2.6 2.8 (H)   PHOS 2.7 - 4.5 mg/dL  3.1 2.3 (L) 2.7   LAC 0.5 - 2.2 mmol/L       CAL 8.5  - 10.6 mg/dL 9.4 9.0 9.0 9.0   AMM 16 - 60 uMol/L       HEP C RNA QUANT            LIVER SPECIFIC SIGNS AND SYMPTOMS:  Confusion: No  Abdominal distention: No  Melena: No  Hematemesis: No  Hematochezia: No  Lower extremity edema: No    REVIEW OF SYSTEMS:    Constitutional: feels well   HEENT: denies   Cardiovascular: denies   Respiratory:denies   Gastrointestinal:denies   Genitourinary: denies   Musculoskeletal: denies   Skin: denies  Neurological: denies   Psychiatric:denies   Endocrine: denies   Hematologic: denies     All other systems negative.      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 She was admitted in November for SOB from [place] that did not respond well to diuretics so she underwent transjugular intrahepatic portosystemic shunt placement on 06/28/2014 with hepatic venous pressure gradient decreasing from 22 to 7 mm Hg. She has been home for about 3 weeks and is generally doing very well. She denies SOB and her mood is improved according to her son [name]. She continues to take lasix 20 mg daily and aldactone 50 mg daily without evidence of significant right pleural effusion on exam today and no lower extremity edema. Her mental status has been clear and she takes lactulose TID with 2-3 soft bowel movements daily.    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 hepatic venous pressure gradient 22 to 7 mm Hg.  -- She is doing very well. WIll continue low dose lasix and aldactone. Continue to follow a low sodium diet.  --  She will call to schedule her doppler ultrasound to assess transjugular intrahepatic portosystemic shunt patency before her follow-up with Dr. Ladell Heads (IR) on 08/30/2014  2) Varices surveillance.  -- No need for EGD as long as the transjugular intrahepatic portosystemic shunt is patent ans she is stable.  3) [place] surveillance.  -- Last CT on 06/27/2014  showed no HCC. Repeat imaging in May 2016.  4) Diabetes. Last Hgb hemoglobin A1c in our systemt was 8.1 on 06/20/2014. She takes lantus 20 U qhs, glipizide and januvia. I advised that she return to her PMD Dr. Andrez Grime to discuss further management.    Return in 3 months with labs before.

## 2014-08-21 ENCOUNTER — Encounter (HOSPITAL_BASED_OUTPATIENT_CLINIC_OR_DEPARTMENT_OTHER): Payer: Self-pay | Admitting: Vascular & Interventional Radiology

## 2014-08-24 ENCOUNTER — Ambulatory Visit
Admission: RE | Admit: 2014-08-24 | Discharge: 2014-08-24 | Disposition: A | Discharge status: Home / Self Care | Payer: Medicare Other | Source: Ambulatory Visit | Attending: Diagnostic Radiology | Admitting: Diagnostic Radiology

## 2014-08-24 DIAGNOSIS — I8511 Secondary esophageal varices with bleeding: Secondary | ICD-10-CM

## 2014-08-24 DIAGNOSIS — I81 Portal vein thrombosis: Principal | ICD-10-CM | POA: Insufficient documentation

## 2014-08-24 DIAGNOSIS — Z95828 Presence of other vascular implants and grafts: Secondary | ICD-10-CM

## 2014-08-24 DIAGNOSIS — K7460 Unspecified cirrhosis of liver: Secondary | ICD-10-CM

## 2014-08-24 DIAGNOSIS — K868 Other specified diseases of pancreas: Secondary | ICD-10-CM | POA: Insufficient documentation

## 2014-08-29 ENCOUNTER — Encounter (HOSPITAL_BASED_OUTPATIENT_CLINIC_OR_DEPARTMENT_OTHER): Payer: Self-pay | Admitting: Vascular & Interventional Radiology

## 2014-08-30 ENCOUNTER — Encounter (HOSPITAL_BASED_OUTPATIENT_CLINIC_OR_DEPARTMENT_OTHER): Payer: Self-pay

## 2014-08-30 ENCOUNTER — Ambulatory Visit (HOSPITAL_BASED_OUTPATIENT_CLINIC_OR_DEPARTMENT_OTHER): Payer: MEDICAID | Admitting: Vascular & Interventional Radiology

## 2014-08-30 ENCOUNTER — Ambulatory Visit (HOSPITAL_BASED_OUTPATIENT_CLINIC_OR_DEPARTMENT_OTHER): Admit: 2014-08-30 | Payer: MEDICAID | Admitting: Gastroenterology

## 2014-08-30 SURGERY — ESOPHAGOGASTRODUODENOSCOPY (EGD)
Anesthesia: Moderate Sedation - by non-anesthesia staff only

## 2014-09-06 ENCOUNTER — Encounter (HOSPITAL_BASED_OUTPATIENT_CLINIC_OR_DEPARTMENT_OTHER): Payer: Self-pay | Admitting: Vascular & Interventional Radiology

## 2014-09-06 ENCOUNTER — Ambulatory Visit (HOSPITAL_BASED_OUTPATIENT_CLINIC_OR_DEPARTMENT_OTHER)
Admission: RE | Admit: 2014-09-06 | Discharge: 2014-09-06 | Disposition: A | Discharge status: Home / Self Care | Payer: Medicare Other

## 2014-09-06 ENCOUNTER — Ambulatory Visit: Payer: Medicare Other | Attending: Internal Medicine | Admitting: Vascular & Interventional Radiology

## 2014-09-06 VITALS — BP 124/67 | HR 91 | Temp 97.0°F | Resp 18 | Ht 61.0 in | Wt 118.2 lb

## 2014-09-06 DIAGNOSIS — R0602 Shortness of breath: Secondary | ICD-10-CM | POA: Insufficient documentation

## 2014-09-06 DIAGNOSIS — K7460 Unspecified cirrhosis of liver: Principal | ICD-10-CM | POA: Insufficient documentation

## 2014-09-06 DIAGNOSIS — J948 Other specified pleural conditions: Secondary | ICD-10-CM | POA: Insufficient documentation

## 2014-09-06 DIAGNOSIS — Z95828 Presence of other vascular implants and grafts: Secondary | ICD-10-CM | POA: Insufficient documentation

## 2014-09-06 DIAGNOSIS — J90 Pleural effusion, not elsewhere classified: Secondary | ICD-10-CM

## 2014-09-06 DIAGNOSIS — R188 Other ascites: Secondary | ICD-10-CM

## 2014-09-06 NOTE — Patient Instructions (Signed)
Obtain chest radiograph today  Follow up with hepatology  Return to Interventional radiology with any TIPS issues.

## 2014-09-06 NOTE — Progress Notes (Addendum)
Vascular and Interventional Radiology Consultation Note    Date: September 06, 2014   Patient Name: Paula Manning   Medical Record #: [id number]   DOB: 07/17/1941, Age: 74 year old  Sex: female        Reason for follow up:   Paula Manning is a 74 year old female status post TIPS procedure for hydrothorax. Patient had been doing very well since the TIPS with no repeat thoracentesis needed and no significant shortness of breath. Over the last 2 days, however, she has had a small cough (non productive) and shortness of breath. Her son thinks she has a cold, but she thinks the fluid has returned. She has no had any issues with confusion or encephalopathy. She has been taking her lactulose daily.      Problem List:  Patient Active Problem List   Diagnosis    Anemia    EV (esophageal varices)    Seizure Disorder    DM circ dis type II    Viral hepatitis C    Hypertensive disorder    Cirrhosis of liver    Emphysema    Ascites    Pancreatic cyst    Osteoporosis    Hypertensive disorder    Hyperlipidemia    Microalbuminuria    Fracture of proximal humerus    Pleural effusion associated with hepatic disorder    Status post thoracentesis    Nasal bone fx-closed    Syncope    CAP (community acquired pneumonia)    Hypoxia    Acute dyspnea    IPMN (intraductal papillary mucinous neoplasm)    At risk for cancer: HCC    Group B streptococcal infection    SOB (shortness of breath)    Type 2 diabetes, uncontrolled, with neuropathy    Diabetic polyneuropathy associated with type 2 diabetes mellitus    Metastatic adenocarcinoma    S/P TIPS (transjugular intrahepatic portosystemic shunt)         ALLERGIES:  No Known Allergies    Current Medications:  Current Outpatient Prescriptions   Medication Sig    alendronate (FOSAMAX) 70 MG tablet     ALPHAGAN P 0.1 % ophthalmic solution     bacitracin 500 UNIT/GM ointment Apply small amount to affected area on face twice daily    CALCIUM CARBONATE 600 MG  OR TABS 1 tablet 2 times daily    ciprofloxacin (CIPRO) 500 MG tablet Take 1 tablet by mouth daily.    furosemide (LASIX) 20 MG tablet Take 2 tablets by mouth daily.    glipiZIDE (GLUCOTROL) 5 MG tablet Take 0.5 tablets (2.5 mg) by mouth 2 times daily.    insulin glargine (LANTUS) 100 UNIT/ML injection Inject 20 Units under the skin daily.    lactulose 10 GM/15ML solution Take 30 mLs (20 g) by mouth 3 times daily.    LUMIGAN 0.01 %     multivitamin (MULTI-VITAMIN) tablet Take 1 tablet by mouth daily.    ranitidine (ZANTAC) 300 MG tablet Take 300 mg by mouth 2 times daily.    rifaximin (XIFAXAN) 550 MG tablet Take 550 mg by mouth every 12 hours.    sitagliptin (JANUVIA) 50 MG tablet Take 1 tablet (50 mg) by mouth daily.    spironolactone (ALDACTONE) 50 MG tablet Take 1 tablet (50 mg) by mouth daily.    VITAMIN D 400 UNIT OR CAPS 1 CAPSULE DAILY     No current facility-administered medications for this visit.       Physical  Exam  BP 124/67 mmHg   Pulse 91   Temp(Src) 97 F (36.1 C) (Oral)   Resp 18   Ht 5\' 1"  (1.549 m)   Wt 53.615 kg (118 lb 3.2 oz)   BMI 22.35 kg/m2   SpO2 97%  Incisions well healed. No e/o ascites.   No LE edema.        Imaging:   Duplex 08/24/14:  IMPRESSION:  1. The TIPS is patent, with appropriate directionality of flow throughout and   velocities within normal limits, as described above. However, vascular flow is  hepatopetal in portal veins, including the right and left, nonspecific.   Attention on follow up.    2. 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    3. Multifocal cystic lesions throughout the pancreas with replacement of the   normal parenchyma, as seen on CT. These are incompletely evaluated on   ultrasound. Recommend continued attention on routine follow-up MRI.    4. Right pleural effusion. No ascites.        Orders Placed This Encounter   Procedures    X-Ray Chest Frontal And Lateral       Assessment and Plan:  In summary, Paula Manning is a 74 year old female status post TIPS procedure for hydrothorax performed on 06/28/14.  She has been doing well except last 2 days when she presented with SOB/cough.   -Will order CXR just to make sure there is no evidence of hydrothorax recurrence (doubtful).  -Ultrasound of the liver was acceptable (no problems w/ flow through the TIPS although hepatopetal in the R/L PV).  -If problems w/ chest radiograph, she may need a TIPS revision, but very low likelihood.  -follow up with hepatology and yearly ultrasounds as needed.   -return to IR w/ any TIPS problems.     I also instructed her to go to her PCP if her cold symptoms worsened or she began having a productive cough.      The majority of the visit (more than 50%) was counseling the patient on the procedure details and follow up care. I spent a total of 25 minutes with the patient face to face.        Note Author: Jackson Latino, MD    ADDENDUM:  Chest radiograph looks a little worse than before. This could be related to medical management, but would like to exclude TIPS malfunction. Plan for tips evaluation w/ possible plasty/stenting as needed.

## 2014-09-11 NOTE — Addendum Note (Signed)
Addended by: Jackson Latino on: 09/11/2014 10:43 AM     Modules accepted: Orders

## 2014-09-12 ENCOUNTER — Emergency Department
Admission: EM | Admit: 2014-09-12 | Discharge: 2014-09-12 | Disposition: A | Discharge status: Home / Self Care | Payer: Medicare Other | Attending: Emergency Medicine | Admitting: Emergency Medicine

## 2014-09-12 DIAGNOSIS — J9811 Atelectasis: Secondary | ICD-10-CM

## 2014-09-12 DIAGNOSIS — Z95828 Presence of other vascular implants and grafts: Secondary | ICD-10-CM

## 2014-09-12 DIAGNOSIS — E1151 Type 2 diabetes mellitus with diabetic peripheral angiopathy without gangrene: Secondary | ICD-10-CM | POA: Insufficient documentation

## 2014-09-12 DIAGNOSIS — I10 Essential (primary) hypertension: Secondary | ICD-10-CM | POA: Insufficient documentation

## 2014-09-12 DIAGNOSIS — Z794 Long term (current) use of insulin: Secondary | ICD-10-CM | POA: Insufficient documentation

## 2014-09-12 DIAGNOSIS — B1920 Unspecified viral hepatitis C without hepatic coma: Secondary | ICD-10-CM | POA: Insufficient documentation

## 2014-09-12 DIAGNOSIS — R3919 Other difficulties with micturition: Secondary | ICD-10-CM | POA: Insufficient documentation

## 2014-09-12 DIAGNOSIS — R0602 Shortness of breath: Secondary | ICD-10-CM

## 2014-09-12 DIAGNOSIS — Z7682 Awaiting organ transplant status: Secondary | ICD-10-CM | POA: Insufficient documentation

## 2014-09-12 DIAGNOSIS — R8299 Other abnormal findings in urine: Secondary | ICD-10-CM | POA: Insufficient documentation

## 2014-09-12 DIAGNOSIS — J918 Pleural effusion in other conditions classified elsewhere: Secondary | ICD-10-CM

## 2014-09-12 DIAGNOSIS — K7460 Unspecified cirrhosis of liver: Secondary | ICD-10-CM | POA: Insufficient documentation

## 2014-09-12 DIAGNOSIS — J90 Pleural effusion, not elsewhere classified: Principal | ICD-10-CM | POA: Insufficient documentation

## 2014-09-12 DIAGNOSIS — K769 Liver disease, unspecified: Secondary | ICD-10-CM

## 2014-09-12 DIAGNOSIS — N3001 Acute cystitis with hematuria: Secondary | ICD-10-CM

## 2014-09-12 DIAGNOSIS — Z79899 Other long term (current) drug therapy: Secondary | ICD-10-CM | POA: Insufficient documentation

## 2014-09-12 LAB — COMPREHENSIVE METABOLIC PANEL, BLOOD
ALT (SGPT): 36 U/L — ABNORMAL HIGH (ref 0–33)
AST (SGOT): 36 U/L — ABNORMAL HIGH (ref 0–32)
Albumin: 3 g/dL — ABNORMAL LOW (ref 3.5–5.2)
Alkaline Phos: 108 U/L (ref 35–140)
Anion Gap: 9 mmol/L (ref 7–15)
BUN: 18 mg/dL (ref 6–20)
Bicarbonate: 27 mmol/L (ref 22–29)
Bilirubin, Tot: 2.61 mg/dL — ABNORMAL HIGH (ref <1.20)
Calcium: 8.5 mg/dL (ref 8.5–10.6)
Chloride: 99 mmol/L (ref 98–107)
Creatinine: 0.84 mg/dL (ref 0.51–0.95)
GFR: >60 mL/min
Glucose: 268 mg/dL — ABNORMAL HIGH (ref 70–115)
Potassium: 3.5 mmol/L (ref 3.5–5.1)
Sodium: 135 mmol/L — ABNORMAL LOW (ref 136–145)
Total Protein: 7 g/dL (ref 6.0–8.0)

## 2014-09-12 LAB — URINALYSIS WITH CULTURE REFLEX, WHEN INDICATED
Bilirubin: NEGATIVE
Ketones: NEGATIVE
Nitrite: NEGATIVE
Protein: NEGATIVE
Specific Gravity: 1.003 (ref 1.002–1.030)
Urobilinogen: NEGATIVE
pH: 7 (ref 5.0–8.0)

## 2014-09-12 LAB — CBC WITH DIFF, BLOOD
ANC-Automated: 4.3 10*3/uL (ref 1.6–7.0)
Abs Eosinophils: 0.1 10*3/uL (ref 0.1–0.7)
Abs Lymphs: 0.9 10*3/uL (ref 0.8–3.1)
Abs Monos: 0.7 10*3/uL (ref 0.2–0.8)
Eosinophils: 1 % (ref 1–4)
Hct: 34.8 % (ref 34.0–45.0)
Hgb: 12.6 gm/dL (ref 11.2–15.7)
Lymphocytes: 15 % — ABNORMAL LOW (ref 19–53)
MCH: 33.4 pg — ABNORMAL HIGH (ref 26.0–32.0)
MCHC: 36.2 % — ABNORMAL HIGH (ref 32.0–36.0)
MCV: 92.3 um3 (ref 79.0–95.0)
MPV: 9.9 fL (ref 9.4–12.4)
Monocytes: 12 % (ref 5–12)
Plt Count: 45 10*3/uL — ABNORMAL LOW (ref 140–370)
RBC: 3.77 10*6/uL — ABNORMAL LOW (ref 3.90–5.20)
RDW: 15.7 % — ABNORMAL HIGH (ref 12.0–14.0)
Segs: 71 % (ref 34–71)
WBC: 6 10*3/uL (ref 4.0–10.0)

## 2014-09-12 MED ORDER — CEPHALEXIN 500 MG OR CAPS
500.00 mg | ORAL_CAPSULE | Freq: Two times a day (BID) | ORAL | Status: AC
Start: 2014-09-12 — End: 2014-09-19

## 2014-09-12 NOTE — ED Provider Notes (Signed)
Emergency Department Note    CC :   Chief Complaint   Patient presents with    Shortness of Breath     pt. sent here to r/o pna has indwelling foley with low urine output doctor also would like her kidneys checked       HPI :   74 year old with history of cirrhosis with ascites, on liver transplant list, recent TIPS for hydrothorax complains of shortness of breath and decreased urine output. Pt reports ongoing shortness of breath and left-sided chest pain with deep inspiration. Pt reports her pulmonary doctor sent her to ED for pleural effusion on x-ray. Denies fever, chills, congestion. Pt also reports difficulty urinating for last 3 days. Feels like she has to go to the bathroom but then only "dribbles". Denies dysuria or urgency. No nausea or vomiting. Pt does not have indwelling Foley catheter as reported in triage note. Pt reports "I have no problem going (to the bathroom) on my own". Pt also reports blood sugars have been high up to the 500s recently, per her doctor due to new medications.     Past Medical History :   Past Medical History   Diagnosis Date    Migraine headache     Glaucoma     Ascites 06/29/2007     History of paracentesis x 2    Pancreatic cyst 06/29/2007    Cirrhosis ([place]) 06/24/2007     Active on the liver transplant waiting list with a blood type of O positive. 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 Due to Hep C    Osteopenia 06/24/2007    Emphysema 06/24/2007    EV (esophageal varices) ([place]) 06/10/2007    Anemia 06/10/2007    HCV (hepatitis C virus) 06/10/2007     Achieved SVR.    Type II or unspecified type diabetes mellitus with peripheral circulatory disorders, not stated as uncontrolled(250.70) 06/10/2007    HTN 06/10/2007    HTN 06/10/2007       Past Surgical History :   Past Surgical History   Procedure Laterality Date    Pb laps surg cholecstc w/expl common duct  1970s     in [place];  open CCX    Pb cesarean delivery only      Pb cstoplasty/cstourtp plstc  any       Bladder suspension    Egd procedure  06/01/2013     Procedure: GI EGD;  Surgeon: Jeanette Caprice, MD;  Location: [facility];  Service: Gastroenterology;;          What To Do With Your Medications      START taking these medications       Add'l Info    cephALEXin 500 MG capsule   Commonly known as:  KEFLEX   Take 1 capsule (500 mg) by mouth 2 times daily for 7 days.    Quantity:  14 capsule   Refills:  0         CONTINUE taking these medications       Add'l Info    alendronate 70 MG tablet   Commonly known as:  FOSAMAX    Refills:  0       ALPHAGAN P 0.1 % ophthalmic solution   Generic drug:  brimonidine    Refills:  0       bacitracin 500 UNIT/GM ointment   Apply small amount to affected area on face twice daily    Quantity:  1 Tube   Refills:  1       Calcium Carbonate 600 MG tablet   1 tablet 2 times daily    Quantity:  one month   Refills:  11       ciprofloxacin 500 MG tablet   Commonly known as:  CIPRO   Take 1 tablet by mouth daily.    Quantity:  30 tablet   Refills:  5       furosemide 20 MG tablet   Commonly known as:  LASIX   Take 2 tablets by mouth daily.    Quantity:  90 tablet   Refills:  0       glipiZIDE 5 MG tablet   Commonly known as:  GLUCOTROL   Take 0.5 tablets (2.5 mg) by mouth 2 times daily.    Quantity:  30 tablet   Refills:  1       insulin glargine 100 UNIT/ML injection   Commonly known as:  LANTUS   Inject 20 Units under the skin daily.    Quantity:  10 mL   Refills:  1       lactulose 10 GM/15ML solution   Take 30 mLs (20 g) by mouth 3 times daily.    Quantity:  946 mL   Refills:  0       LUMIGAN 0.01 %   Generic drug:  bimatoprost    Refills:  0       multivitamin tablet   Take 1 tablet by mouth daily.    Refills:  0       ranitidine 300 MG tablet   Commonly known as:  ZANTAC   Take 300 mg by mouth 2 times daily.    Refills:  0       sitagliptin 50 MG tablet   Commonly known as:  JANUVIA   Take 1 tablet (50 mg) by mouth daily.    Quantity:  60 tablet   Refills:  1        spironolactone 50 MG tablet   Commonly known as:  ALDACTONE   Take 1 tablet (50 mg) by mouth daily.    Quantity:  30 tablet   Refills:  0       vitamin D 400 UNITS capsule   1 CAPSULE DAILY    Quantity:  30   Refills:  11       XIFAXAN 550 MG tablet   Take 550 mg by mouth every 12 hours.   Generic drug:  rifaximin    Refills:  0         Where to Get Your Medications     These are the prescriptions that you need to pick up.         Please check with staff for printed prescription or if prescription was faxed to your pharmacy.   -  cephALEXin 500 MG capsule                      Allergies :  Review of patient's allergies indicates no known allergies.    Social History :   Tobacco : No  Etoh : No  Illicit, IV, rx drug abuse : No  Living situation : Lives at home with family    Family History :  family history is not on file.    Review of Systems :   Review of Systems   Constitutional: Negative for fever, chills and malaise/fatigue.   HENT: Negative for hearing loss.    Eyes:  Negative for blurred vision and double vision.   Respiratory: Positive for shortness of breath. Negative for cough, hemoptysis, sputum production and wheezing.    Cardiovascular: Positive for chest pain. Negative for palpitations.   Gastrointestinal: Negative for nausea, vomiting, abdominal pain, diarrhea and constipation.   Genitourinary: Positive for frequency. Negative for dysuria, urgency, hematuria and flank pain.   Musculoskeletal: Negative for myalgias, back pain, joint pain and neck pain.   Skin: Negative for itching and rash.   Neurological: Negative for dizziness, tingling, sensory change, speech change, weakness and headaches.       Physical Exam :    09/12/14  1605 09/12/14  1824 09/12/14  1949 09/12/14  2027   BP: 141/50 136/55 132/59    Pulse: 88 80 102    Temp: 98.5 F (36.9 C) 98.5 F (36.9 C)     Resp: 20 16 18     SpO2: 96% 94% 94% 94%       Vitals reviewed  Gen: Patient is in NAD, A&Ox4, behaving appropriately, non-toxic  appearing.  HEENT: NC/AT, PERRL. Moist mucous membranes. No lymphadenopathy.   Neck: Supple, no JVD, no lymphadenopathy.  Lungs: Normal breath sounds bilaterally. No wheeze/rales/rhonchi.   CV: RRR. Normal heart sounds. No murmurs, rubs or gallops appreciated.   Abdomen: Normal bowel sounds. NTND. No masses, organomegaly.   Back: Atraumatic. No CVA tenderness.   Extremities: Normal ROM. Cap refill < 2 sec. No LE edema.   Neurologic: Mentation appropriate. CN II-XII intact, b/l UE and LE strength 5/5, b/l UE and LE sensation intact to light touch.   Skin: No rashes, erythema, lesions.    Labs :  Results for orders placed or performed during the hospital encounter of 09/12/14   COMPREHENSIVE METABOLIC PANEL, BLOOD   Result Value Ref Range    Glucose 268 (H) 70 - 115 mg/dL    BUN 18 6 - 20 mg/dL    Creatinine 0.84 0.51 - 0.95 mg/dL    GFR >60 mL/min    Sodium 135 (L) 136 - 145 mmol/L    Potassium 3.5 3.5 - 5.1 mmol/L    Chloride 99 98 - 107 mmol/L    Bicarbonate 27 22 - 29 mmol/L    Anion Gap 9 7 - 15 mmol/L    Calcium 8.5 8.5 - 10.6 mg/dL    Total Protein 7.0 6.0 - 8.0 g/dL    Albumin 3.0 (L) 3.5 - 5.2 g/dL    Bilirubin, Tot 2.61 (H) <1.20 mg/dL    AST (SGOT) 36 (H) 0 - 32 U/L    ALT (SGPT) 36 (H) 0 - 33 U/L    Alkaline Phos 108 35 - 140 U/L   CBC WITH ADIFF, BLOOD   Result Value Ref Range    WBC 6.0 4.0 - 10.0 1000/mm3    RBC 3.77 (L) 3.90 - 5.20 mill/mm3    Hgb 12.6 11.2 - 15.7 gm/dL    Hct 34.8 34.0 - 45.0 %    MCV 92.3 79.0 - 95.0 um3    MCH 33.4 (H) 26.0 - 32.0 pgm    MCHC 36.2 (H) 32.0 - 36.0 %    RDW 15.7 (H) 12.0 - 14.0 %    MPV 9.9 9.4 - 12.4 fL    Plt Count 45 (L) 140 - 370 1000/mm3    Segs 71 34 - 71 %    Lymphocytes 15 (L) 19 - 53 %    Monocytes 12 5 - 12 %    Eosinophils 1 <  1-4 %    ANC-Automated 4.3 1.6 - 7.0 1000/mm3    Abs Lymphs 0.9 0.8 - 3.1 1000/mm3    Abs Monos 0.7 0.2 - 0.8 1000/mm3    Abs Eosinophils 0.1 <0.1-0.7 1000/mm3    Diff Type Automated    URINALYSIS WITH CULTURE REFLEX, WHEN INDICATED      Result Value Ref Range    Type Not Specified     Color Yellow Yellow    Appearance Clear Clear    Specific Gravity 1.003 1.002 - 1.030    pH 7.0 5.0 - 8.0    Protein Negative Negative    Glucose 1+ (A) Negative    Ketones Negative Negative    Bilirubin Negative Negative    Blood 2+ (A) Negative    Urobilinogen Negative Negative    Nitrite Negative Negative    Leuk Esterase 1+ (A) Negative    WBC 3-5 (A) 0-2/HPF    RBC 0-2 0-2/HPF    Squam. Epithelial Cell 0-5(RARE) <6-10(FEW)   URINE CULTURE   Result Value Ref Range    Urine Culture Result No Growth        Diagnostic Studies :  X-ray Chest Frontal And Lateral    09/12/2014   EXAM DESCRIPTION: CHEST 2 VIEWS FRONTAL & LATERAL  CLINICAL HISTORY: History of effusion  COMPARISON: Chest radiograph 09/06/2014  FINDINGS:   See IMPRESSION  IMPRESSION: Increased large right pleural effusion in with increased right middle lobe and  right lower lobe atelectasis.  Tips.  Minimally calcified aorta. Normal cardiac silhouette.  The left lung is well expanded and clear.  Degenerative changes of the left glenohumeral joint and the acromioclavicular  joints. Degenerative changes of the spine.    X-ray Chest Frontal And Lateral    09/06/2014   EXAM DESCRIPTION: CHEST 2 VIEWS FRONTAL & LATERAL  CLINICAL HISTORY: Shortness of breath, evaluate for right pleural effusion  COMPARISON: 07/04/2014  FINDINGS: See impression.  IMPRESSION: Small to moderate right pleural effusion, probably increased from prior.  No other change. TIPS noted.    US Abdomen Complete    08/24/2014   EXAM DESCRIPTION: US ABDOMEN COMPLETE  CLINICAL HISTORY: History of hepatitis-C/cirrhosis complicated by refractory hepatic  hydrothorax, post TIPS in November 2015  TECHNIQUE: Per protocol.  COMPARISON: CT abdomen and pelvis 06/27/2014.  FINDINGS: The liver measures 10.7 cm in long axis. It is coarsened in echotexture, with  a nodular contour compatible with cirrhosis. Mild ascites. There is no intra  or extra hepatic bile  duct dilation. The common bile duct measures 5 mm. The  gallbladder is surgically absent.  Native Vasculature and TIPS: Hepatic arteries and veins are patent. The main portal vein is enlarged to 19 mm, and partially thrombosed, as seen  on CT 06/27/2014. Flow is hepatopetal in the all portal veins. The TIPS is patent, with appropriate directionality of flow throughout and  normal velocities of 80.2, 74 and 59.7 centimeters/second in the portal end,  midshunt and venous end, respectively.  The right kidney measures 7.1 cm and the left kidney measures 9.3 cm in long  axis. Both are within normal limits with no hydronephrosis or renal calculi.  Multifocal cystic lesions throughout the pancreas, replacing much of the  normal parenchyma, as seen on CT. The spleen is enlarged, measuring 14.0 cm in  long axis.  The visualized aorta and inferior vena cava are within normal limits.  Right pleural effusion.  IMPRESSION: 1. The TIPS is patent, with appropriate directionality of flow throughout and  velocities within normal limits, as described above. However, vascular flow is  hepatopetal in portal veins, including the right and left, nonspecific.  Attention on follow up.  2. 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  3. Multifocal cystic lesions throughout the pancreas with replacement of the  normal parenchyma, as seen on CT. These are incompletely evaluated on  ultrasound. Recommend continued attention on routine follow-up MRI.  4. Right pleural effusion. No ascites.   Findings were discussed by Dr. Katheren Shams with Dr. Corrin Parker at 1611  hours on 08/24/2014. Readback was obtained.  CONCURRENT SUPERVISION: I have reviewed the images and agree with the resident's interpretation.    Assessment/Plan :  Pt presenting with sob.  Has hx of TIPS.  ddx includes increasing pleural effusion, pna, ptx.  Also with urinary complaints  cxr showing increased effusion. No respiratory distress in  the ed.  Has appointment with IR later this week.  Felt comfortable with plan to f/u with IR as scheduled.  ua with possible infection.  Will treat based on symptoms, ucx pending.  Pt nontoxic appearing, dc/ed.  Given return precautions, instructed to f/u with PMD and IR.    Case discussed with attending, Dr. Wyvonnia Dusky, MD  09/15/14 [phone]

## 2014-09-12 NOTE — ED Notes (Signed)
Pt amb to/from bathroom c steady gait, assisted by granddaughter.   Instructed granddaughter on clean catch urine, stated understanding.

## 2014-09-12 NOTE — ED Notes (Signed)
Urine collected , kept in the mini fridge

## 2014-09-12 NOTE — ED Notes (Signed)
Pt presents to the ED with complaints of intermittent SOB, which she notes increases on exertion. Pt able to ambulate to bathroom with walker that she uses at baseline with steady gait. States she feels well when lying still, and also 'feels better after taking her medicine" lungs clear and equal bilat. Pt's daughter states " we think she has water on her lungs, she had surgery in November for that". Pt well appearing, breathing even and unlabored. satting mid 90s on RA.

## 2014-09-12 NOTE — Discharge Instructions (Addendum)
You are scheduled for a revision of TIPS 2/4 at 1300 (1pm) with [facility] Interventional Radiology.          Efusin pleural     Pleural Effusion     1.  Usted ha sido atendido por un derrame pleural.   1.  You have been seen for pleural effusion.             2.  Un derrame pleural es cuando una gran cantidad de fluido (lquido) se acumula entre el interior de la pared pectoral y el exterior de los pulmones. Generalmente hay una cantidad de lquido pequea en este espacio. Esto ayuda a que los pulmones se deslicen suavemente por toda la pared pectoral durante la respiracin. Si hay demasiado lquido, puede haber muchos problemas al respirar.   2.  A pleural effusion is when a large amount of fluid (liquid) collects in between the inside of your chest wall and the outside of the lung. There is normally a very small amount of fluid in this space. It helps the lung slide smoothly along the chest wall with breathing. Too much fluid can cause a lot of problems breathing.             3.  Algunas causas de un derrame pleural son:   3.  Some causes of pleural effusion are:      * Fuga de lquido dentro de ese espacio durante un episodio de insuficiencia cardiaca congestiva.    * Fluid leakage into the space during an episode of congestive heart failure.      * Sangre, la cual generalmente surge a raz de una lesin.    * Blood, which is usually because of an injury.      * Pus a causa de una infeccin.    * Pus due to an infection.      * Fuga de lquido cuando se expande un cncer al pulmn cerca de la pleura.    * Fluid leaked when a cancer is spread to the lung near the pleura.      * Lquido debido a problemas con el hgado, el pncreas o los riones.    * Fluid from problems with the liver, pancreas or kidneys.             4.  DEBE BUSCAR ATENCIN MDICA INMEDIATAMENTE, AQU O EN LA SALA DE EMERGENCIAS MS CERCANA, SI SE PRESENTA CUALQUIERA DE LAS SIGUIENTES  SITUACIONES:   4.  YOU SHOULD SEEK MEDICAL ATTENTION IMMEDIATELY, EITHER HERE OR AT THE NEAREST EMERGENCY DEPARTMENT, IF ANY OF THE FOLLOWING OCCUR:      * Tiene falta de aire ms aguda o problemas para respirar.    * You have more shortness of breath or any problems breathing.      * Tiene dolor de pecho o incomodidad.    * Your chest hurts or is uncomfortable.      * Tiene fiebre (temperatura mayor de 100.20F / 38C) o escalofros.    * You get fever (temperature higher than 100.20F / 38C) or chills.      * Tiene otros problemas o preocupaciones.    * You have any other problems or concerns.

## 2014-09-14 ENCOUNTER — Encounter (HOSPITAL_BASED_OUTPATIENT_CLINIC_OR_DEPARTMENT_OTHER)
Admission: RE | Disposition: A | Discharge status: Home Health | Payer: Self-pay | Source: Ambulatory Visit | Attending: Vascular & Interventional Radiology

## 2014-09-14 ENCOUNTER — Encounter (HOSPITAL_BASED_OUTPATIENT_CLINIC_OR_DEPARTMENT_OTHER): Payer: Self-pay

## 2014-09-14 ENCOUNTER — Ambulatory Visit
Admission: RE | Admit: 2014-09-14 | Discharge: 2014-09-14 | Disposition: A | Discharge status: Home / Self Care | Payer: Medicare Other | Source: Ambulatory Visit | Attending: Vascular & Interventional Radiology | Admitting: Vascular & Interventional Radiology

## 2014-09-14 ENCOUNTER — Other Ambulatory Visit: Payer: Self-pay | Admitting: Internal Medicine

## 2014-09-14 DIAGNOSIS — K7460 Unspecified cirrhosis of liver: Secondary | ICD-10-CM

## 2014-09-14 DIAGNOSIS — J90 Pleural effusion, not elsewhere classified: Principal | ICD-10-CM | POA: Insufficient documentation

## 2014-09-14 DIAGNOSIS — T82858A Stenosis of vascular prosthetic devices, implants and grafts, initial encounter: Secondary | ICD-10-CM

## 2014-09-14 DIAGNOSIS — K769 Liver disease, unspecified: Secondary | ICD-10-CM | POA: Insufficient documentation

## 2014-09-14 DIAGNOSIS — J948 Other specified pleural conditions: Secondary | ICD-10-CM

## 2014-09-14 LAB — URINE CULTURE: Urine Culture Result: NO GROWTH

## 2014-09-14 LAB — GLUCOSE (POCT)
Glucose (POCT): 194 mg/dL — ABNORMAL HIGH (ref 70–115)
Glucose (POCT): 176 mg/dL — ABNORMAL HIGH (ref 70–115)

## 2014-09-14 SURGERY — IR TIPS DIPS REVISION
Anesthesia: Conscious Sedation | Laterality: Right

## 2014-09-14 MED ORDER — FENTANYL CITRATE 0.05 MG/ML IJ SOLN
25.0000 ug | INTRAMUSCULAR | Status: DC | PRN
Start: 2014-09-14 — End: 2014-09-14

## 2014-09-14 MED ORDER — HYDROCODONE-ACETAMINOPHEN 5-325 MG OR TABS
1.0000 | ORAL_TABLET | ORAL | Status: DC | PRN
Start: 2014-09-14 — End: 2014-09-14

## 2014-09-14 MED ORDER — IODIXANOL 320 MG/ML IV SOLN
INTRAVENOUS | Status: DC | PRN
Start: 2014-09-14 — End: 2014-09-14
  Administered 2014-09-14 (×2): 30 mL

## 2014-09-14 MED ORDER — MIDAZOLAM HCL 2 MG/2ML IJ SOLN
INTRAMUSCULAR | Status: DC | PRN
Start: 2014-09-14 — End: 2014-09-14
  Administered 2014-09-14 (×5): 0.5 mg via INTRAVENOUS

## 2014-09-14 MED ORDER — LIDOCAINE, BUFFERED 1% IJ SOLN (COMPOUNDED)
INTRADERMAL | Status: DC | PRN
Start: 2014-09-14 — End: 2014-09-14
  Administered 2014-09-14: 10 mL via INTRADERMAL

## 2014-09-14 MED ORDER — SODIUM CHLORIDE 0.9 % IV SOLN
INTRAVENOUS | Status: DC | PRN
Start: 2014-09-14 — End: 2014-09-14
  Administered 2014-09-14: 100 mL/h via INTRAVENOUS

## 2014-09-14 MED ORDER — FENTANYL CITRATE 0.05 MG/ML IJ SOLN
INTRAMUSCULAR | Status: DC | PRN
Start: 2014-09-14 — End: 2014-09-14
  Administered 2014-09-14 (×4): 25 ug via INTRAVENOUS

## 2014-09-14 MED ORDER — HYDROMORPHONE HCL 1 MG/ML IJ SOLN
1.0000 mg | INTRAMUSCULAR | Status: DC | PRN
Start: 2014-09-14 — End: 2014-09-14

## 2014-09-14 MED ORDER — ONDANSETRON HCL 4 MG/2ML IV SOLN
4.0000 mg | INTRAMUSCULAR | Status: DC | PRN
Start: 2014-09-14 — End: 2014-09-14

## 2014-09-14 MED ORDER — HEPARIN SODIUM LOCK FLUSH 100 UNIT/ML IJ SOLN CUSTOM
500.0000 [IU] | Freq: Once | INTRAVENOUS | Status: DC | PRN
Start: 2014-09-14 — End: 2014-09-14

## 2014-09-14 SURGICAL SUPPLY — 26 items
CATHETER BALLOON 8MMX4CM (Drains/Catheter/Tubes/Reservoir) ×3
CATHETER CENTESIS 1-STEP 4FR 7CM (Lines/Drains) ×3 IMPLANT
CATHETER OMNI FLUSH .038" 5FR X 65CM (Drains/Catheter/Tubes/Reservoir) ×3 IMPLANT
CATHETER TORCON NB ADV MPA 5FR X 65CM 0.038", BEACON TIP (Drains/Catheter/Tubes/Reservoir) ×3
COVER PROBE MICROTEK INTRAOPERATIVE 5" X 96" PC1308 (Drapes/towels) ×6 IMPLANT
DILATOR AQ HYDROPHILIC 8FR X 20CM (Procedural wires/sheaths/catheters/balloons/dilators) ×3 IMPLANT
DILATOR VESSEL 11FR X 20CM, 0.038" (Procedural wires/sheaths/catheters/balloons/dilators) ×3 IMPLANT
DRAPE FLUORO APRON (Misc Medical Supply) ×6 IMPLANT
DRESSING QUIKCLOT INTERVENTIONAL 1.5" X 1.5" (Dressings/packing) ×3 IMPLANT
FLOWSWITCH TIP HP (Misc Medical Supply) ×3 IMPLANT
GUIDEWIRE AMPLATZ SUPER STIFF 0.035" X 260CM, 1CM TAPER, STRAIGHT, SHORT (Misc Medical Supply) ×3 IMPLANT
GUIDEWIRE GLIDEWIRE ANGLED .035" X 150CM (Misc Medical Supply) ×3 IMPLANT
INTRODUCER CHECKFLO 10F/40CM (Cannula) ×3 IMPLANT
INTRODUCER SHEATH PRELUDE 0.038" 7FR X 11CM ~~LOC~~ (Procedural wires/sheaths/catheters/balloons/dilators) ×3 IMPLANT
KIT CUSTOM ANGIO SYRINGE THORNTON (Kits/Sets/Trays) ×3 IMPLANT
PREP CHLOROPREP 10.5ML 1-STEP (Misc Medical Supply) ×6 IMPLANT
PROCEDURE PACK - IR VASCULAR (Drape/Gowns/Gloves/Pack) ×3
SET MICROPUNCTURE 5FR X 10CM, TRANSITIONLESS G43870 (Kits/Sets/Trays) ×3
STENT VIATORR 10MM X 7/2CM (Stent) ×3 IMPLANT
SYRINGE CONTRAST INJECTOR 150ML (Needles/punch/cannula/biopsy) ×3 IMPLANT
SYRINGE INFLATION BASIX COMPAK 30ATM (Needles/punch/cannula/biopsy) ×3 IMPLANT
TOWEL BLUE STERILE DISPOSABLE (Drape/Gowns/Gloves/Pack) ×3 IMPLANT
TRANSDUCER COMPASS UNIVERSALHG (Misc Medical Supply) ×3
TRAY THORACENTESIS (Kits/Sets/Trays) ×3 IMPLANT
TUBING FLEXIBLE HIGH PRESSURE 72" (Drains/Catheter/Tubes/Reservoir) ×3 IMPLANT
WIRE .035 180CM LONG ROSEN (Misc Medical Supply) ×3 IMPLANT

## 2014-09-14 NOTE — ED Follow-up Note (Signed)
Follow-up type: Callback       Routine ED Patient Call Back    Patient unable to be contacted, message left

## 2014-09-14 NOTE — RN OR/Procedure Note (Signed)
Family requested to speak to MD's involved in [place] case as they had questions that this writer could not answer.  Dr. Glendell Docker and Dr. Alferd Apa both scrubbed in a procedure.  Family provided with phone number to IR with instructions to call tomorrow when Dr. Glendell Docker could speak to them on the phone.

## 2014-09-14 NOTE — Discharge Instructions (Signed)
Instrucciones del alta para el paciente de radiologa intervencionista  Procedimiento:   TIPS/ REVISIN DEL TIPS  Desvo/Derivacin portosistmico intraheptico transyugular  (TIPS, por sus siglas en ingls)    SEDACIN    Los medicamentos sedantes que se le administraron durante el procedimiento permanecern en su organismo hasta por 24 horas. Por lo tanto, le recomendamos que tome las cosas con calma durante el resto del da. No conduzca automvil, no firme ningn documento legal ni beba alcohol.    ACTIVIDAD    No haga ejercicios ni levante objetos pesados (nada que pese ms de 10 libras), durante 72 horas despus del procedimiento.    Puede retirar el vendaje y ducharse 24 horas despus del procedimiento.    No nade, no se d baos de tina ni se sumerja en baeras de hidromasaje tipo jacuzzi, durante 7 das.    MANEJO DEL DOLOR    Aunque el procedimiento se realiz a travs del cuello, es normal sentir dolor en la parte superior del  abdomen, ya que es ah donde en realidad se coloc el stent (endoprtesis).  Es posible que le recetemos  analgsicos. Si esto ocurre despus de que [employer], por favor llmenos.    CUIDADO DEL LUGAR DE LA PUNCIN    NO aplique nada de cremas ni lociones en el lugar de la puncin.    Aplique presin directa si presenta hinchazn o sangrado en el lugar de la puncin.  Favor de llamarnos inmediatamente de modo que podamos brindarle ayuda mdica o dirgirse al Departamento de Urgencias ms cercano.    Si se emple un dispositivo de cierre (Starclose, Angio-Sea1TM) para cerrar su arteria, favor de consultar el folleto correspondiente que le fue entregado.    Beba de seis a ocho vasos de lquidos despus del procedimiento.    LLAME A SU MDICO CUANDO PRESENTE    Fiebre (temperatura superior a 100  F) o escalofros.    Dolor intenso o Tourist information centre manager.    Sangrado, enrojecimiento o Estate agent de la puncin.    Dificultad para respirar.    Dolor,  enfriamiento o hinchazn de la pierna o el brazo ms all del Environmental consultant de la puncin.    Disminucin notoria en la cantidad de orina que elimine.    MEDICAMENTOS    Favor de reanudar la toma de sus medicamentos habituales, a menos que su mdico le indique lo contrario.    Le recomendamos que considere llevar una dieta baja en protenas y sal despus del procedimiento, a fin de reducir la posibilidad de acumulacin de lquidos en el abdomen.    INSTRUCCIONES ADICIONALES: ____________________________     MDICO QUE REALIZA EL PROCEDIMIENTO: Dr.  Ladell Heads    PARA COMUNICARSE CON NOSOTROS    Durante el horario de trabajo normal, de lunes a viernes de 8:00 a.m. a 4:00 p.m., llame al nmero indicado  o en la parte superior de la pgina.    Despus del horario de [place], llame al operador del [place] de [place] al [phone] y pida hablar con  o el Mdico radilogo intervencionista que est de [place].

## 2014-09-22 ENCOUNTER — Encounter (HOSPITAL_BASED_OUTPATIENT_CLINIC_OR_DEPARTMENT_OTHER): Payer: Self-pay | Admitting: Vascular & Interventional Radiology

## 2014-09-23 ENCOUNTER — Encounter (HOSPITAL_COMMUNITY): Payer: Self-pay | Admitting: Emergency Medicine

## 2014-09-23 ENCOUNTER — Inpatient Hospital Stay
Admission: EM | Admit: 2014-09-23 | Discharge: 2014-11-10 | DRG: 441 | Disposition: E | Payer: Medicare Other | Attending: Critical Care Medicine | Admitting: Critical Care Medicine

## 2014-09-23 DIAGNOSIS — N390 Urinary tract infection, site not specified: Secondary | ICD-10-CM | POA: Diagnosis present

## 2014-09-23 DIAGNOSIS — R1084 Generalized abdominal pain: Secondary | ICD-10-CM

## 2014-09-23 DIAGNOSIS — K567 Ileus, unspecified: Secondary | ICD-10-CM | POA: Diagnosis not present

## 2014-09-23 DIAGNOSIS — K7682 Hepatic encephalopathy (CMS-HCC): Secondary | ICD-10-CM

## 2014-09-23 DIAGNOSIS — E1140 Type 2 diabetes mellitus with diabetic neuropathy, unspecified: Secondary | ICD-10-CM | POA: Diagnosis present

## 2014-09-23 DIAGNOSIS — E8770 Fluid overload, unspecified: Secondary | ICD-10-CM | POA: Diagnosis not present

## 2014-09-23 DIAGNOSIS — E1151 Type 2 diabetes mellitus with diabetic peripheral angiopathy without gangrene: Secondary | ICD-10-CM | POA: Diagnosis present

## 2014-09-23 DIAGNOSIS — A0819 Acute gastroenteropathy due to other small round viruses: Secondary | ICD-10-CM | POA: Diagnosis present

## 2014-09-23 DIAGNOSIS — Z794 Long term (current) use of insulin: Secondary | ICD-10-CM

## 2014-09-23 DIAGNOSIS — K767 Hepatorenal syndrome: Secondary | ICD-10-CM | POA: Diagnosis not present

## 2014-09-23 DIAGNOSIS — Z7682 Awaiting organ transplant status: Secondary | ICD-10-CM

## 2014-09-23 DIAGNOSIS — E1142 Type 2 diabetes mellitus with diabetic polyneuropathy: Secondary | ICD-10-CM | POA: Diagnosis present

## 2014-09-23 DIAGNOSIS — K469 Unspecified abdominal hernia without obstruction or gangrene: Secondary | ICD-10-CM | POA: Diagnosis present

## 2014-09-23 DIAGNOSIS — J811 Chronic pulmonary edema: Secondary | ICD-10-CM | POA: Diagnosis not present

## 2014-09-23 DIAGNOSIS — A419 Sepsis, unspecified organism: Secondary | ICD-10-CM | POA: Diagnosis not present

## 2014-09-23 DIAGNOSIS — E440 Moderate protein-calorie malnutrition: Secondary | ICD-10-CM | POA: Diagnosis present

## 2014-09-23 DIAGNOSIS — R1312 Dysphagia, oropharyngeal phase: Secondary | ICD-10-CM

## 2014-09-23 DIAGNOSIS — J9809 Other diseases of bronchus, not elsewhere classified: Secondary | ICD-10-CM | POA: Diagnosis not present

## 2014-09-23 DIAGNOSIS — I81 Portal vein thrombosis: Secondary | ICD-10-CM | POA: Diagnosis present

## 2014-09-23 DIAGNOSIS — Z9119 Patient's noncompliance with other medical treatment and regimen: Secondary | ICD-10-CM | POA: Diagnosis not present

## 2014-09-23 DIAGNOSIS — Z800 Family history of malignant neoplasm of digestive organs: Secondary | ICD-10-CM

## 2014-09-23 DIAGNOSIS — R188 Other ascites: Secondary | ICD-10-CM | POA: Diagnosis present

## 2014-09-23 DIAGNOSIS — Z95828 Presence of other vascular implants and grafts: Secondary | ICD-10-CM | POA: Diagnosis present

## 2014-09-23 DIAGNOSIS — I4891 Unspecified atrial fibrillation: Secondary | ICD-10-CM | POA: Diagnosis not present

## 2014-09-23 DIAGNOSIS — K7290 Hepatic failure, unspecified without coma: Secondary | ICD-10-CM | POA: Diagnosis present

## 2014-09-23 DIAGNOSIS — K7460 Unspecified cirrhosis of liver: Secondary | ICD-10-CM | POA: Diagnosis present

## 2014-09-23 DIAGNOSIS — R6520 Severe sepsis without septic shock: Secondary | ICD-10-CM | POA: Diagnosis not present

## 2014-09-23 DIAGNOSIS — J439 Emphysema, unspecified: Secondary | ICD-10-CM | POA: Diagnosis present

## 2014-09-23 DIAGNOSIS — Z9114 Patient's other noncompliance with medication regimen: Secondary | ICD-10-CM | POA: Diagnosis present

## 2014-09-23 DIAGNOSIS — I10 Essential (primary) hypertension: Secondary | ICD-10-CM | POA: Diagnosis present

## 2014-09-23 DIAGNOSIS — Z515 Encounter for palliative care: Secondary | ICD-10-CM

## 2014-09-23 DIAGNOSIS — J449 Chronic obstructive pulmonary disease, unspecified: Secondary | ICD-10-CM | POA: Diagnosis present

## 2014-09-23 DIAGNOSIS — E1165 Type 2 diabetes mellitus with hyperglycemia: Secondary | ICD-10-CM | POA: Diagnosis present

## 2014-09-23 DIAGNOSIS — E872 Acidosis, unspecified: Secondary | ICD-10-CM

## 2014-09-23 DIAGNOSIS — J948 Other specified pleural conditions: Secondary | ICD-10-CM | POA: Diagnosis not present

## 2014-09-23 DIAGNOSIS — I480 Paroxysmal atrial fibrillation: Secondary | ICD-10-CM | POA: Diagnosis present

## 2014-09-23 DIAGNOSIS — F29 Unspecified psychosis not due to a substance or known physiological condition: Secondary | ICD-10-CM

## 2014-09-23 DIAGNOSIS — E11649 Type 2 diabetes mellitus with hypoglycemia without coma: Secondary | ICD-10-CM | POA: Diagnosis not present

## 2014-09-23 DIAGNOSIS — I780 Hereditary hemorrhagic telangiectasia: Secondary | ICD-10-CM | POA: Diagnosis present

## 2014-09-23 DIAGNOSIS — M8580 Other specified disorders of bone density and structure, unspecified site: Secondary | ICD-10-CM | POA: Diagnosis present

## 2014-09-23 DIAGNOSIS — G43909 Migraine, unspecified, not intractable, without status migrainosus: Secondary | ICD-10-CM | POA: Diagnosis present

## 2014-09-23 DIAGNOSIS — B1921 Unspecified viral hepatitis C with hepatic coma: Principal | ICD-10-CM | POA: Diagnosis present

## 2014-09-23 DIAGNOSIS — K219 Gastro-esophageal reflux disease without esophagitis: Secondary | ICD-10-CM | POA: Diagnosis present

## 2014-09-23 DIAGNOSIS — R112 Nausea with vomiting, unspecified: Secondary | ICD-10-CM

## 2014-09-23 DIAGNOSIS — J90 Pleural effusion, not elsewhere classified: Secondary | ICD-10-CM | POA: Diagnosis not present

## 2014-09-23 DIAGNOSIS — R791 Abnormal coagulation profile: Secondary | ICD-10-CM | POA: Diagnosis not present

## 2014-09-23 DIAGNOSIS — H409 Unspecified glaucoma: Secondary | ICD-10-CM | POA: Diagnosis present

## 2014-09-23 DIAGNOSIS — K766 Portal hypertension: Secondary | ICD-10-CM | POA: Diagnosis present

## 2014-09-23 DIAGNOSIS — K7469 Other cirrhosis of liver: Secondary | ICD-10-CM

## 2014-09-23 DIAGNOSIS — IMO0002 Reserved for concepts with insufficient information to code with codable children: Secondary | ICD-10-CM

## 2014-09-23 DIAGNOSIS — Z87440 Personal history of urinary (tract) infections: Secondary | ICD-10-CM

## 2014-09-23 DIAGNOSIS — N179 Acute kidney failure, unspecified: Secondary | ICD-10-CM | POA: Diagnosis not present

## 2014-09-23 DIAGNOSIS — R262 Difficulty in walking, not elsewhere classified: Secondary | ICD-10-CM

## 2014-09-23 DIAGNOSIS — M810 Age-related osteoporosis without current pathological fracture: Secondary | ICD-10-CM | POA: Diagnosis present

## 2014-09-23 DIAGNOSIS — K862 Cyst of pancreas: Secondary | ICD-10-CM | POA: Diagnosis present

## 2014-09-23 DIAGNOSIS — Z66 Do not resuscitate: Secondary | ICD-10-CM | POA: Diagnosis not present

## 2014-09-23 DIAGNOSIS — J9601 Acute respiratory failure with hypoxia: Secondary | ICD-10-CM | POA: Diagnosis not present

## 2014-09-23 DIAGNOSIS — Z87891 Personal history of nicotine dependence: Secondary | ICD-10-CM

## 2014-09-23 DIAGNOSIS — Z9889 Other specified postprocedural states: Secondary | ICD-10-CM

## 2014-09-23 DIAGNOSIS — R29898 Other symptoms and signs involving the musculoskeletal system: Secondary | ICD-10-CM

## 2014-09-23 DIAGNOSIS — E860 Dehydration: Secondary | ICD-10-CM | POA: Diagnosis present

## 2014-09-23 DIAGNOSIS — Z6821 Body mass index (BMI) 21.0-21.9, adult: Secondary | ICD-10-CM

## 2014-09-23 LAB — URINALYSIS
Bilirubin: NEGATIVE
Bilirubin: NEGATIVE
Ketones: NEGATIVE
Ketones: NEGATIVE
Leuk Esterase: NEGATIVE
Nitrite: NEGATIVE
Nitrite: NEGATIVE
RBC: >50 — AB (ref 0–?)
Specific Gravity: 1.026 (ref 1.002–1.030)
Specific Gravity: 1.029 (ref 1.002–1.030)
pH: 5 (ref 5.0–8.0)
pH: 6 (ref 5.0–8.0)

## 2014-09-23 LAB — ECG 12-LEAD
ATRIAL RATE: 88 {beats}/min
P AXIS: 79 degrees
PR INTERVAL: 138 ms
QRS INTERVAL/DURATION: 76 ms
QT: 498 ms
QTC INTERVAL: 602 ms
R AXIS: 85 degrees
T AXIS: 57 degrees
VENTRICULAR RATE: 88 {beats}/min

## 2014-09-23 LAB — COMPREHENSIVE METABOLIC PANEL, BLOOD
ALBUMIN: 2.2 g/dL — ABNORMAL LOW (ref 3.5–5.2)
ALKALINE PHOS: 91 U/L (ref 35–140)
ALT (SGPT): 29 U/L (ref 0–33)
ALT (SGPT): 27 U/L (ref 0–33)
ANION GAP: 12 mmol/L (ref 7–15)
AST (SGOT): 42 U/L — ABNORMAL HIGH (ref 0–32)
AST (SGOT): 39 U/L — ABNORMAL HIGH (ref 0–32)
Albumin: 2.4 g/dL — ABNORMAL LOW (ref 3.5–5.2)
Alkaline Phos: 98 U/L (ref 35–140)
Anion Gap: 12 mmol/L (ref 7–15)
BILIRUBIN, TOT: 3.34 mg/dL — ABNORMAL HIGH (ref <1.20)
BUN: 13 mg/dL (ref 6–20)
BUN: 11 mg/dL (ref 6–20)
Bicarbonate: 24 mmol/L (ref 22–29)
Bicarbonate: 20 mmol/L — ABNORMAL LOW (ref 22–29)
Bilirubin, Tot: 4.05 mg/dL — ABNORMAL HIGH (ref <1.20)
CALCIUM: 7.4 mg/dL — ABNORMAL LOW (ref 8.5–10.6)
CHLORIDE: 101 mmol/L (ref 98–107)
Calcium: 7.8 mg/dL — ABNORMAL LOW (ref 8.5–10.6)
Chloride: 99 mmol/L (ref 98–107)
Creatinine: 0.74 mg/dL (ref 0.51–0.95)
Creatinine: 0.57 mg/dL (ref 0.51–0.95)
GFR: >60 mL/min
GFR: >60 mL/min
Glucose: 196 mg/dL — ABNORMAL HIGH (ref 70–115)
Glucose: 229 mg/dL — ABNORMAL HIGH (ref 70–115)
Potassium: 3.7 mmol/L (ref 3.5–5.1)
Potassium: 3.2 mmol/L — ABNORMAL LOW (ref 3.5–5.1)
Sodium: 135 mmol/L — ABNORMAL LOW (ref 136–145)
Sodium: 133 mmol/L — ABNORMAL LOW (ref 136–145)
TOTAL PROTEIN: 5.6 g/dL — ABNORMAL LOW (ref 6.0–8.0)
Total Protein: 6.1 g/dL (ref 6.0–8.0)

## 2014-09-23 LAB — AMMONIA, PLASMA: Ammonia: 123 umol/L — ABNORMAL HIGH (ref 16–60)

## 2014-09-23 LAB — CBC WITH DIFF, BLOOD
ANC-Automated: 6.1 10*3/uL (ref 1.6–7.0)
Abs Lymphs: 0.4 10*3/uL — ABNORMAL LOW (ref 0.8–3.1)
Abs Monos: 0.5 10*3/uL (ref 0.2–0.8)
Hct: 30.6 % — ABNORMAL LOW (ref 34.0–45.0)
Hgb: 11.4 gm/dL (ref 11.2–15.7)
Lymphocytes: 6 % — ABNORMAL LOW (ref 19–53)
MCH: 33.5 pg — ABNORMAL HIGH (ref 26.0–32.0)
MCHC: 37.3 % (ref 32.0–36.0)
MCV: 90 um3 (ref 79.0–95.0)
MPV: 9.8 fL (ref 9.4–12.4)
Monocytes: 7 % (ref 5–12)
Plt Count: 62 10*3/uL — ABNORMAL LOW (ref 140–370)
RBC: 3.4 10*6/uL — ABNORMAL LOW (ref 3.90–5.20)
RDW: 17.8 % — ABNORMAL HIGH (ref 12.0–14.0)
Segs: 87 % — ABNORMAL HIGH (ref 34–71)
WBC: 7 10*3/uL (ref 4.0–10.0)

## 2014-09-23 LAB — BILIRUBIN, DIR BLOOD: Bilirubin, Dir: 1.2 mg/dL — ABNORMAL HIGH (ref <0.2)

## 2014-09-23 LAB — LIPASE, BLOOD: Lipase: 31 U/L (ref 13–60)

## 2014-09-23 LAB — GLUCOSE (POCT): GLUCOSE (POCT): 231 mg/dL — ABNORMAL HIGH (ref 70–115)

## 2014-09-23 LAB — PROTHROMBIN TIME, BLOOD
INR: 1.7
PT,Patient: 18.4 s — ABNORMAL HIGH (ref 9.7–12.5)

## 2014-09-23 LAB — MAGNESIUM, BLOOD: Magnesium: 1.8 mg/dL (ref 1.7–2.6)

## 2014-09-23 LAB — TSH, BLOOD: TSH: 1.8 u[IU]/mL (ref 0.27–4.20)

## 2014-09-23 LAB — APTT, BLOOD: PTT: 33.4 s (ref 25.0–34.0)

## 2014-09-23 LAB — TROPONIN T, BLOOD: Troponin T: 0.02 ng/mL (ref <0.01)

## 2014-09-23 LAB — LACTATE, BLOOD: Lactate: 2.4 mmol/L — ABNORMAL HIGH (ref 0.5–2.2)

## 2014-09-23 MED ORDER — DEXTROSE 50 % IV SOLN
12.5000 g | INTRAVENOUS | Status: DC | PRN
Start: 2014-09-23 — End: 2014-10-14
  Administered 2014-10-13: 12.5 g via INTRAVENOUS
  Filled 2014-09-23: qty 50

## 2014-09-23 MED ORDER — SODIUM CHLORIDE 0.9 % IV SOLN
1000.00 mg | Freq: Once | INTRAVENOUS | Status: AC
Start: 2014-09-23 — End: 2014-09-23
  Administered 2014-09-23: 1000 mg via INTRAVENOUS
  Filled 2014-09-23: qty 1000

## 2014-09-23 MED ORDER — BACITRACIN 500 UNIT/GM EX OINT (CUSTOM)
TOPICAL_OINTMENT | Freq: Two times a day (BID) | CUTANEOUS | Status: DC
Start: 2014-09-23 — End: 2014-09-23

## 2014-09-23 MED ORDER — SODIUM CHLORIDE 0.9 % IJ SOLN (CUSTOM)
3.0000 mL | Freq: Three times a day (TID) | INTRAMUSCULAR | Status: DC
Start: 2014-09-23 — End: 2014-10-14
  Administered 2014-09-24 – 2014-10-13 (×36): 3 mL via INTRAVENOUS

## 2014-09-23 MED ORDER — SODIUM CHLORIDE 0.9 % IV SOLN
Freq: Once | INTRAVENOUS | Status: AC
Start: 2014-09-23 — End: 2014-09-23
  Administered 2014-09-23: 19:00:00 via INTRAVENOUS

## 2014-09-23 MED ORDER — LACTULOSE 10 GM/15ML OR SOLN
20.00 g | Freq: Once | ORAL | Status: AC
Start: 2014-09-23 — End: 2014-09-23
  Administered 2014-09-23: 20 g via ORAL
  Filled 2014-09-23: qty 30

## 2014-09-23 MED ORDER — CIPROFLOXACIN HCL 500 MG OR TABS
500.0000 mg | ORAL_TABLET | Freq: Every day | ORAL | Status: DC
Start: 2014-09-24 — End: 2014-09-24
  Administered 2014-09-24: 500 mg via ORAL
  Filled 2014-09-23 (×2): qty 1

## 2014-09-23 MED ORDER — HEPARIN SODIUM (PORCINE) PF 5000 UNIT/0.5ML IJ SOLN
5000.0000 [IU] | Freq: Two times a day (BID) | INTRAMUSCULAR | Status: DC
Start: 2014-09-23 — End: 2014-09-27
  Administered 2014-09-23 – 2014-09-26 (×6): 5000 [IU] via SUBCUTANEOUS
  Filled 2014-09-23 (×7): qty 0.5

## 2014-09-23 MED ORDER — INSULIN GLARGINE 100 UNIT/ML SC SOLN
10.0000 [IU] | Freq: Every morning | SUBCUTANEOUS | Status: DC
Start: 2014-09-24 — End: 2014-09-26
  Administered 2014-09-24 – 2014-09-26 (×3): 10 [IU] via SUBCUTANEOUS
  Filled 2014-09-23 (×3): qty 10

## 2014-09-23 MED ORDER — DEXTROSE (DIABETIC USE) 40 % OR GEL
1.0000 | ORAL | Status: DC | PRN
Start: 2014-09-23 — End: 2014-10-14

## 2014-09-23 MED ORDER — SODIUM CHLORIDE 0.9% TKO INFUSION
INTRAVENOUS | Status: DC | PRN
Start: 2014-09-23 — End: 2014-10-14

## 2014-09-23 MED ORDER — SODIUM CHLORIDE 0.9 % IJ SOLN (CUSTOM)
3.0000 mL | INTRAMUSCULAR | Status: DC | PRN
Start: 2014-09-23 — End: 2014-10-14

## 2014-09-23 MED ORDER — NALOXONE HCL 0.4 MG/ML IJ SOLN
0.1000 mg | INTRAMUSCULAR | Status: DC | PRN
Start: 2014-09-23 — End: 2014-10-14

## 2014-09-23 MED ORDER — LACTULOSE 10 GM/15ML OR SOLN
20.0000 g | Freq: Three times a day (TID) | ORAL | Status: DC
Start: 2014-09-23 — End: 2014-09-28
  Administered 2014-09-23 – 2014-09-27 (×7): 20 g via ORAL
  Filled 2014-09-23 (×8): qty 30

## 2014-09-23 MED ORDER — ONDANSETRON HCL 4 MG/2ML IV SOLN
4.00 mg | Freq: Four times a day (QID) | INTRAMUSCULAR | Status: AC | PRN
Start: 2014-09-23 — End: 2014-09-23

## 2014-09-23 MED ORDER — INSULIN LISPRO (HUMAN) 100 UNIT/ML SC SOLN (CUSTOM)
1.0000 [IU] | Freq: Four times a day (QID) | INTRAMUSCULAR | Status: DC
Start: 2014-09-23 — End: 2014-09-26
  Administered 2014-09-23: 22:00:00 1 [IU] via SUBCUTANEOUS
  Administered 2014-09-24 (×2): 2 [IU] via SUBCUTANEOUS
  Administered 2014-09-24: 1 [IU] via SUBCUTANEOUS
  Administered 2014-09-24: 12:00:00 2 [IU] via SUBCUTANEOUS
  Administered 2014-09-25: 12:00:00 3 [IU] via SUBCUTANEOUS
  Administered 2014-09-25: 06:00:00 2 [IU] via SUBCUTANEOUS
  Administered 2014-09-25: 22:00:00 3 [IU] via SUBCUTANEOUS
  Administered 2014-09-25: 18:00:00 4 [IU] via SUBCUTANEOUS
  Administered 2014-09-26: 07:00:00 5 [IU] via SUBCUTANEOUS
  Filled 2014-09-23 (×2): qty 2
  Filled 2014-09-23: qty 3
  Filled 2014-09-23: qty 4
  Filled 2014-09-23: qty 3
  Filled 2014-09-23: qty 5
  Filled 2014-09-23: qty 2
  Filled 2014-09-23: qty 1
  Filled 2014-09-23: qty 2
  Filled 2014-09-23: qty 1

## 2014-09-23 MED ORDER — GLUCAGON HCL (RDNA) 1 MG IJ SOLR
1.0000 mg | Freq: Once | INTRAMUSCULAR | Status: DC | PRN
Start: 2014-09-23 — End: 2014-10-14

## 2014-09-23 MED ORDER — ENOXAPARIN SODIUM 30 MG/0.3ML SC SOLN
30.0000 mg | Freq: Two times a day (BID) | SUBCUTANEOUS | Status: DC
Start: 2014-09-23 — End: 2014-09-23

## 2014-09-23 MED ORDER — RIFAXIMIN 550 MG PO TABS
550.0000 mg | ORAL_TABLET | Freq: Two times a day (BID) | ORAL | Status: DC
Start: 2014-09-23 — End: 2014-10-13
  Administered 2014-09-23 – 2014-10-12 (×38): 550 mg via ORAL
  Filled 2014-09-23 (×42): qty 1

## 2014-09-23 MED ORDER — VITAMIN D 400 UNIT OR TABS
400.0000 [IU] | ORAL_TABLET | Freq: Every day | ORAL | Status: DC
Start: 2014-09-24 — End: 2014-10-13
  Administered 2014-09-24 – 2014-10-12 (×18): 400 [IU] via ORAL
  Filled 2014-09-23 (×21): qty 1

## 2014-09-23 MED ORDER — FAMOTIDINE 20 MG OR TABS
20.0000 mg | ORAL_TABLET | Freq: Every day | ORAL | Status: DC
Start: 2014-09-24 — End: 2014-10-13
  Administered 2014-09-26 – 2014-10-12 (×17): 20 mg via ORAL
  Filled 2014-09-23 (×19): qty 1

## 2014-09-23 MED ORDER — CALCIUM CARBONATE 1250 MG/5ML OR SUSP
1250.0000 mg | Freq: Every day | ORAL | Status: DC
Start: 2014-09-24 — End: 2014-09-23

## 2014-09-23 MED ORDER — MULTI-VITAMINS PO TABS
1.0000 | ORAL_TABLET | Freq: Every day | ORAL | Status: DC
Start: 2014-09-24 — End: 2014-10-03
  Administered 2014-09-26 – 2014-10-02 (×7): 1 via ORAL
  Filled 2014-09-23 (×9): qty 1

## 2014-09-23 MED ORDER — POTASSIUM CHLORIDE CRYS CR 10 MEQ OR TBCR
40.00 meq | EXTENDED_RELEASE_TABLET | Freq: Once | ORAL | Status: AC
Start: 2014-09-23 — End: 2014-09-23
  Administered 2014-09-23: 40 meq via ORAL
  Filled 2014-09-23: qty 4

## 2014-09-23 MED ORDER — GLUCOSE 4 GM PO CHEW (CUSTOM)
4.0000 | CHEWABLE_TABLET | ORAL | Status: DC | PRN
Start: 2014-09-23 — End: 2014-10-14

## 2014-09-23 NOTE — ED Notes (Signed)
brought in by son, states weakness, confusion x2 days, difficulty walking/going to the bathroom +n/v, mid abd pain.  told by Dr Chyrl Civatte, radiologist to come to the ER for evaluation. son thinks she is not taking her meds "specifically for remembering"  s/p TIPs recheck 3 weeks ago. TIPS completed in November. denies fevers/chills/cp/sob

## 2014-09-23 NOTE — ED Floor Report (Addendum)
ED to [place] is a 74 year old female    Chief Complaint   Patient presents with    Weakness     brought in by son, states weakness, confusion x2 days, difficulty walking/going to the bathroom +n/v, mid abd pain.  told by Dr Chyrl Civatte, radiologist to come to the ER for evaluation. son thinks she is not taking her meds "specifically for remembering"  s/p TIPs procedure 3 weeks ago. denies fevers/chills/cp/sob       Admitted for: Hepatic encephalopathy    Code Status:  Please refer to In-pt admitting doctors orders     Past Medical History   Diagnosis Date    Migraine headache     Glaucoma     Ascites 06/29/2007     History of paracentesis x 2    Pancreatic cyst 06/29/2007    Cirrhosis ([place]) 06/24/2007     Active on the liver transplant waiting list with a blood type of O positive. 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    Type II or unspecified type diabetes mellitus with peripheral circulatory disorders, not stated as uncontrolled(250.70) 06/10/2007    HTN 06/10/2007    HTN 06/10/2007       Past Surgical History   Procedure Laterality Date    Pb laps surg cholecstc w/expl common duct  1970s     in [place];  open CCX    Pb cesarean delivery only      Pb cstoplasty/cstourtp plstc any       Bladder suspension    Egd procedure  06/01/2013     Procedure: GI EGD;  Surgeon: Jeanette Caprice, MD;  Location: [facility];  Service: Gastroenterology;;    Tips procedure         Allergies: Review of patient's allergies indicates no known allergies.    Level of Care : med/grace    ED Fall Risk: No    Skin issues:  no    >> If yes, note areas of skin breakdown. See appropriate photos.      Ambulatory:  Yes with assitance    Sitter needed: no    Suicide Risk :  no    Isolation Required: no     >> If  yes , what type of isolation:    Is patient in custody?  no    Is patient in restraints? no    Is patient on Heparin? no If yes, complete below:   Bolus=    Units      Units/hr   @   Mls/hour      Time of next PT/PTT draw:          Brief Summary of ED Visit (to include focused assessment and neuro status):  74 year old female with a PMH significant for migraines, glaucoma, ascitis, cirrhosis, osteopenia, emphysema, EV, anemia, HCV, DM2, HTN who presents with confusion. Had a TIPS procedure in November. Doctor who performed TIPS advised ED visit. She has been fatigued, diffusely weak, confused. Son believes she has not been taking her lactulose. Denies SOB, CP. Endorses abdominal pain x 3 days. Poor po intake, nausea, vomiting. Vomit is NB. Endorses diarrhea, NB. Abdominal pain is now resolved. Symptoms were last night. Denies  HA.  Spanish speaking.      See  RN SHIFT ASSESSMENT ADULT  for full assessment, exceptions will be listed.    Cardiac rhythm:  NSR    Oxygen Delivery: None    Filed Vitals:    09/30/2014 1024   BP: 139/43   Pulse: 90   Temp: 98.5 F (36.9 C)   Resp: 17   Height: 5\' 1"  (1.549 m)   Weight: 52.617 kg (116 lb)   SpO2: 98%       Lab Results   Component Value Date    WBC 7.0 10/02/2014    RBC 3.40* 09/12/2014    HGB 11.4 09/20/2014    HCT 30.6* 10/06/2014    MCV 90.0 09/13/2014    MCHC 37.3* 09/29/2014    RDW 17.8* 09/15/2014    PLT 62* 09/22/2014    MPV 9.8 10/05/2014       Lab Results   Component Value Date    NA 135* 09/22/2014    K 3.7 09/25/2014    CL 99 09/11/2014    BICARB 24 09/29/2014    BUN 13 10/08/2014    CREAT 0.74 09/11/2014    GLU 196* 09/25/2014    [place] 7.8* 09/13/2014       Lab Results   Component Value Date    MG 1.8 09/29/2014    LACTATE 2.4* 09/17/2014    AMMONIA 123* 09/12/2014       Lab Results   Component Value Date    TROPONIN 0.02 10/02/2014       No results found for: PH, PCO2, O2CONTENT, IVHC3, IVBE, O2SAT, UNPH, UNPCO2, ARTPH, ARTPCO2, ARTO2CNT, IAHC3, IABE, ARTO2SAT,  UNAPH, UNAPCO2    No results found for this visit on 09/14/2014.    RADIOLOGIC STUDIES NOT COMPLETED: none  (none unless otherwise noted)    Patient Lines/Drains/Airways Status    Active PICC Line / CVC Line / PIV Line / Drain / Airway / Intraosseous Line / Epidural Line / ART Line / Line Type / Wound     Name: Placement date: Placement time: Site: Days:    Peripheral IV - 20 G Right Forearm 09/19/2014  1048  Forearm  less than 1                  Any significant events and interventions with responses:  none      Floor nurse informed that report is ready for review.  Opportunity to answer questions with floor RN face to face or by phone. ER number [place] [id number] . ER RN Otelia Limes to be contacted for any questions.    Has this report been updated?  no  By Who:   Time:   Additional information by updated user:

## 2014-09-23 NOTE — ED Notes (Signed)
Report given to [facility] RN 10E.

## 2014-09-23 NOTE — ED Notes (Signed)
Blood samples collected/labeled and sent to the lab.

## 2014-09-23 NOTE — ED Notes (Signed)
Assumed pt care, report received from [place]. Pt resting c eyes closed, rr even/full/effective/non labored, pt in nad.

## 2014-09-23 NOTE — ED Provider Notes (Signed)
Emergency Department Note  [facility] electronic medical record reviewed for pertinent medical history.     Nursing Triage Note :   Chief Complaint   Patient presents with    Weakness     brought in by son, states weakness, confusion x2 days, difficulty walking/going to the bathroom +n/v, mid abd pain.  told by Dr Chyrl Civatte, radiologist to come to the ER for evaluation. son thinks she is not taking her meds "specifically for remembering"  s/p TIPs procedure 3 weeks ago. denies fevers/chills/cp/sob       HPI :   74 year old  female with a PMH significant for migraines, glaucoma, ascitis, cirrhosis, osteopenia, emphysema, EV, anemia, HCV, DM2, HTN who presents with confusion. Had a TIPS procedure in November. Doctor who performed TIPS advised ED visit. She has been fatigued, diffusely weak, confused. Son believes she has not been taking her lactulose. Denies SOB, CP. Endorses abdominal pain x 3 days. Poor po intake, nausea, vomiting. Vomit is NB. Endorses diarrhea, NB. Abdominal pain is now resolved. Symptoms were last night. Denies HA.     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 Due to Hep C    Osteopenia 06/24/2007    Emphysema 06/24/2007    EV (esophageal varices) ([place]) 06/10/2007    Anemia 06/10/2007    HCV (hepatitis C virus) 06/10/2007     Achieved SVR.    Type II or unspecified type diabetes mellitus with peripheral circulatory disorders, not stated as uncontrolled(250.70) 06/10/2007    HTN 06/10/2007    HTN 06/10/2007       Past Surgical history :   Past Surgical History   Procedure Laterality Date    Pb laps surg cholecstc w/expl common duct  1970s     in [place];  open CCX    Pb cesarean delivery only       Pb cstoplasty/cstourtp plstc any       Bladder suspension    Egd procedure  06/01/2013     Procedure: GI EGD;  Surgeon: Jeanette Caprice, MD;  Location: [facility];  Service: Gastroenterology;;    Tips procedure         Family History:  No DM, HTN, Barrett    Social History:  -tob (prior), -EtOH, lives with family in [place].     Medications:      What To Do With Your Medications      CONTINUE taking these medications       Add'l Info    alendronate 70 MG tablet   Commonly known as:  FOSAMAX    Refills:  0       ALPHAGAN P 0.1 % ophthalmic solution   Generic drug:  brimonidine    Refills:  0       bacitracin 500 UNIT/GM ointment   Apply small amount to affected area on face twice daily    Quantity:  1 Tube   Refills:  1       Calcium Carbonate 600 MG tablet   1 tablet 2 times daily    Quantity:  one month   Refills:  11       ciprofloxacin 500 MG tablet   Commonly known as:  CIPRO   Take 1 tablet by mouth daily.    Quantity:  30 tablet   Refills:  5       furosemide 20 MG tablet   Commonly known as:  LASIX   Take 2 tablets by mouth daily.    Quantity:  90 tablet   Refills:  0       glipiZIDE 5 MG tablet   Commonly known as:  GLUCOTROL   Take 0.5 tablets (2.5 mg) by mouth 2 times daily.    Quantity:  30 tablet   Refills:  1       insulin glargine 100 UNIT/ML injection   Commonly known as:  LANTUS   Inject 20 Units under the skin daily.    Quantity:  10 mL   Refills:  1       lactulose 10 GM/15ML solution   Take 30 mLs (20 g) by mouth 3 times daily.    Quantity:  946 mL   Refills:  0       LUMIGAN 0.01 %   Generic drug:  bimatoprost    Refills:  0       multivitamin tablet   Take 1 tablet by mouth daily.    Refills:  0       ranitidine 300 MG tablet   Commonly known as:  ZANTAC   Take 300 mg by mouth 2 times daily.    Refills:  0       sitagliptin 50 MG tablet   Commonly known as:  JANUVIA   Take 1 tablet (50 mg) by mouth daily.    Quantity:  60 tablet   Refills:  1       spironolactone 50 MG tablet   Commonly  known as:  ALDACTONE   Take 1 tablet (50 mg) by mouth daily.    Quantity:  30 tablet   Refills:  0       vitamin D 400 UNITS capsule   1 CAPSULE DAILY    Quantity:  30   Refills:  11       XIFAXAN 550 MG tablet   Take 550 mg by mouth every 12 hours.   Generic drug:  rifaximin    Refills:  0             Allergies: Review of patient's allergies indicates no known allergies.    Review of Systems:   Const: No fevers, chills  HEENT: No rhinnorhea, no ocular discharge  Pulm: No cough, SOB  Card: No CP, palpitations  MSK: No LE edema, + weakness  Skin: No rash  Neuro: No numbness, dizziness, blurred vision.   GI: + abdominal pain, nausea, vomiting, no diarrhea, constipation, hematochezia, melena  GU: No dysuria, hematuria  ENDO: No heat/cold intolerance      Physical Exam:   09/22/2014  1024   BP: 139/43   Pulse: 90   Temp: 98.5 F (36.9 C)   Resp: 17   SpO2: 98%     Nursing note and vitals reviewed. Pt not hypoxic.  Constitutional: The patient is oriented to person, place, and time and appears well-developed and well-nourished. No distress. Encephalopathic.  HEENT: Head atraumatic  Eyes: Normal extra-ocular movements. Pupils are equal, round, and reactive to light. No conjunctival injection.   Cardiovascular: Regular rhythm, normal heart sounds. Exam reveals no gallop and no friction rub. No murmur heard. 2+  radial pulses b/l.  Pulmonary/Chest: Effort normal and breath sounds normal. No respiratory distress. No wheezes, rhonchi, or rales . No chest wall tenderness.   Abdominal: Soft. No distension and no masses. There is mild umbilical tenderness. No rebound or guarding.  Neuro: No evidence of facial droop, normal speech, mentation appropriate. SILT grossly b/l UE, LE. 2+ patellar reflexes b/l. 5/5 strength grip, UE elbow flexion/extension, abduction/adduction, LE hip flexion/extension, abduction/adduction, knee flexion/extension, plantarflexion/dorsiflexion. CN 2-12 grossly intact. Ambulates poorly.  Psychiatric: Normal  affect. Mood not labile nor depressed.   Skin: No rashes, lesions, or wounds.   Extr: No LE edema.     Results for orders placed or performed during the hospital encounter of 10/07/2014   CBC WITH ADIFF, BLOOD   Result Value Ref Range    WBC 7.0 4.0 - 10.0 1000/mm3    RBC 3.40 (L) 3.90 - 5.20 mill/mm3    Hgb 11.4 11.2 - 15.7 gm/dL    Hct 30.6 (L) 34.0 - 45.0 %    MCV 90.0 79.0 - 95.0 um3    MCH 33.5 (H) 26.0 - 32.0 pgm    MCHC 37.3 (HH) 32.0 - 36.0 %    RDW 17.8 (H) 12.0 - 14.0 %    MPV 9.8 9.4 - 12.4 fL    Plt Count 62 (L) 140 - 370 1000/mm3    Segs 87 (H) 34 - 71 %    Lymphocytes 6 (L) 19 - 53 %    Monocytes 7 5 - 12 %    ANC-Automated 6.1 1.6 - 7.0 1000/mm3    Abs Lymphs 0.4 (L) 0.8 - 3.1 1000/mm3    Abs Monos 0.5 0.2 - 0.8 1000/mm3    Diff Type Automated    COMPREHENSIVE METABOLIC PANEL, BLOOD   Result Value Ref Range    Glucose 196 (H) 70 - 115 mg/dL    BUN 13 6 - 20 mg/dL    Creatinine 0.74 0.51 - 0.95 mg/dL    GFR >60 mL/min    Sodium 135 (L) 136 - 145 mmol/L    Potassium 3.7 3.5 - 5.1 mmol/L    Chloride 99 98 - 107 mmol/L    Bicarbonate 24 22 - 29 mmol/L    Anion Gap 12 7 - 15 mmol/L    Calcium 7.8 (L) 8.5 - 10.6 mg/dL    Total Protein 6.1 6.0 - 8.0 g/dL    Albumin 2.4 (L) 3.5 - 5.2 g/dL    Bilirubin, Tot 4.05 (H) <1.20 mg/dL    AST (SGOT) 42 (H) 0 - 32 U/L    ALT (SGPT) 29 0 - 33 U/L    Alkaline Phos 98 35 - 140 U/L   PROTHROMBIN TIME, BLOOD   Result Value Ref Range    PT,Patient 18.4 (H) 9.7 - 12.5 sec    INR 1.7    APTT, BLOOD   Result Value Ref Range    PTT 33.4 25.0 - 34.0 sec   MAGNESIUM, BLOOD   Result Value Ref Range    Magnesium 1.8 1.7 - 2.6 mg/dL   TSH, BLOOD   Result Value Ref Range    TSH 1.80 0.27 - 4.20 uIU/mL   TROPONIN T, BLOOD   Result Value Ref Range    Troponin T 0.02 <0.01 ng/mL   LIPASE, BLOOD   Result Value Ref Range    Lipase 31 13 - 60 U/L   BILIRUBIN, DIR BLOOD   Result Value Ref Range    Bilirubin, Dir 1.2 (H) <  0.2 mg/dL   AMMONIA, PLASMA   Result Value Ref Range    Ammonia 123 (H)  16 - 60 uMol/L   LACTATE, BLOOD   Result Value Ref Range    Lactate 2.4 (H) 0.5 - 2.2 mmol/L         Ct Head W/o Contrast    09/28/2014   EXAM DESCRIPTION: CT HEAD/BRAIN W/O CONTRAST  CLINICAL HISTORY: Confusion and right leg weakness. Cirrhosis, prior TIPS  TECHNIQUE: A CT scan of the head was performed with 5 mm contiguous axial slices from the  foramen magnum to the skull vertex without the use of intravenous contrast.  Sagittal and coronal reformats were provided.  CT DI vol: 63 mGy DLP: 1024 mGy-cm  COMPARISON: None available  FINDINGS: The ventricles and sulci are normal for age.  No mass-effect or midline shift  is present, and the basal cisterns are patent. There is no evidence of  intracranial hemorrhage, extra-axial fluid collection, hydrocephalus, or  findings to suggest large transcortical infarct. There is hypoattenuation  within the periventricular and subcortical white matter about the atria  greater than the frontal horns, nonspecific.  No acute soft tissue abnormality. No acute or aggressive appearing calvarial  abnormality. Three small exostoses extend from the outer table of the frontal  bone. Frothy secretions are present within the right sphenoid sinus, which has  reactive bone changes suggests a chronic component as well. Mastoid air cells  are clear. Multiple prominent arachnoid granulations. Bilateral cataract  repair changes. Mild bilateral enophthalmos.  IMPRESSION: 1. No evidence of acute intracranial hemorrhage or findings to suggest  territorial/transcortical infarct. 2. Mild white matter changes. These may be associated with prior small vessel  ischemia. If there is concern for encephalopathy, MRI would be more helpful  for acute evaluation. MRI screening: No intracranial or intraorbital metal. 3. Frothy secretions within the right sphenoid sinuses well as reactive bone  changes, suggestive of acute on chronic sinusitis.    X-ray Chest Frontal And Lateral    09/14/2014   EXAM DESCRIPTION:  CHEST 2 VIEWS FRONTAL & LATERAL  CLINICAL HISTORY: Patient with cirrhosis, post TIPS in November 2015, presents with weakness,  nausea and vomiting in the setting of likely missing doses of lactulose  COMPARISON: Chest radiographs 09/12/2014.  FINDINGS: Decreased but persistent small right pleural effusion, significantly improved  from 09/12/2014. TIPS noted.  No consolidation.  Unremarkable cardiomediastinal silhouette.  No acute osseous abnormality identified.  IMPRESSION: Decreased but persistent small right pleural effusion, significantly improved  from 09/12/2014. TIPS noted. No consolidation.  CONCURRENT SUPERVISION: I have reviewed the images and agree with the resident's interpretation.        [place] Abdomen Limited    09/15/2014   EXAM DESCRIPTION: US ABDOMEN LIMITED  CLINICAL HISTORY: Status post TIPS procedure  TECHNIQUE: Per protocol  COMPARISON: 08/24/2014  FINDINGS: The liver measures 11.3 cm in long axis. It is normal in echogenicity. No  focal lesions are identified. There is no intra or extra hepatic bile duct  dilation. The common bile duct measures four mm. The gallbladder is not  visualized. A patent TIPS catheter is present. The TIPS velocities are: Portal  end 115 ; mid 93 ; hepatic 125 cm/s. Flow in the right and left portal veins  is hepatofugal and towards the TIPS ; flow in the main portal vein is  hepatopetal.  The right kidney measures nine cm in long axis and is within normal limits  with no hydronephrosis or renal calculi.  The  visualized portion of the pancreas again demonstrates multiple cystic  lesions throughout the pancreas, as seen on prior imaging The visualized aorta and inferior vena cava are within normal limits. Right pleural effusion. No evidence of intra abdominal ascites.  IMPRESSION: Patent TIPS catheter with normal velocities and appropriate flow in portal  veins. 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  CONCURRENT SUPERVISION: I have reviewed the images and agree with the resident's interpretation.      ED Course & Clinical Decision Making:  74 year old  female presents with confusion.    History of not taking lactulose.  Patient confused, consistent with hepatic encephalopathy. No focal neuro findings. No fluid pocket to tap for SBP. Lactate elevated, but afebrile. Not clearing lactate well given cirrhosis. Blood cultures sent. CT head without evidence of hemorrhage, ischemia. Lactulose given. UA pending. No other gross electrolyte abnormalities. ECG with no evidence of arrhythmia, ischemia, or R heart strain. Troponin non-negative. No chest pain. Repeat troponin sent. TSH within normal limits. Hepatology consulted.    Admitted to medicine med/surg.    I have discussed my thought process and plan for the patient with the attending physician.    Raynelle Jan, MD   [facility] Resident                Woodfin Ganja, MD  Resident  09/19/2014 [place], Pennelope Bracken, MD  Resident  09/12/2014 1415    Raynelle Dick, MD  09/22/[id number]

## 2014-09-23 NOTE — ED Notes (Signed)
ecg to MD Snyder along with prior

## 2014-09-23 NOTE — H&P (Signed)
Medicine Resident History and Physical  Date of Admission: 09/18/2014    Identification: 74 year old woman with pmh of HCV (GT2, achieved SVR), Cirrhosis (c/b ascites, HE, EV) s/p TIPS (06/2014) and recent TIPS revision (09/14/14), DM2, HTN, and recent UTI treated with 4 day course of abx by non-[facility] PCP who presents with 4 days of nausea, vomiting, and 1 day of altered mental status.    CC: altered mental status    History of Present Illness:   Paula Manning is a 74 year old Spanish-speaking woman with pmh of HCV (GT2, achieved SVR), Cirrhosis (c/b ascites, HE, EV) s/p TIPS (06/2014) and recent TIPS revision (09/14/14), DM2, HTN, and recent UTI treated with 4 day course of abx by non-[facility] PCP who presents with 4 days of nausea, vomiting, and 1 day of altered mental status.    History was obtained via interpretation from her son. She was in her usual state of health until 4 days ago when she acutely developed nausea and vomiting. She in unsure of the cause and denies having any strange or undercooked foods, sick contacts, or recent travel. She denies abdominal pain at that time but now reports reflux irritation from the multiple bouts of vomiting. She denies hematemesis, melena, and hematochezia. Her PO intake was poor for the 4 days prior to admission.    One day prior to admission, her family noticed that she appeared altered. She was unable to focus on the television, answer basic questions, and she was very unsteady on her feet requiring help from family to ambulate (not her usual). Notably, she has not taken her lactulose in the past 2 days because of one small episode of loose stool.    She denies chest pain, cough, headache, neck stiffness, dysuria, or hematuria.     Upon presentation to the ED, she was hemodynamically stable, given a dose of Ceftriaxone for presumed UTI as well as lactulose.    When seen by the internal medicine team in the ED, she was alert and oriented.    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 Due to Hep C    Osteopenia 06/24/2007    Emphysema 06/24/2007    EV (esophageal varices) ([facility]) 06/10/2007    Anemia 06/10/2007    HCV (hepatitis C virus) 06/10/2007     Achieved SVR.    Type II or unspecified type diabetes mellitus with peripheral circulatory disorders, not stated as uncontrolled(250.70) 06/10/2007    HTN 06/10/2007    HTN 06/10/2007     Past Surgical History   Procedure Laterality Date    Pb laps surg cholecstc w/expl common duct  1970s     in [place];  open CCX    Pb cesarean delivery only      Pb cstoplasty/cstourtp plstc  any       Bladder suspension    Egd procedure  06/01/2013     Procedure: GI EGD;  Surgeon: Jeanette Caprice, MD;  Location: [facility];  Service: Gastroenterology;;    Tips procedure       Social History:   Lives: in [place] with her husband, dependent on husband  Marital/Sex: Married  Work: retired  Smoking Hx: >30 pack year smoking hx  Etoh Hx: no, minimal historic use  Drug Hx: none    Family History:   Cholelithiasis  Pancreatic cancer (sister)  Colon cancer (sister)  Endometrial cancer (sister)  No heart disease    Home Medications:  No current facility-administered medications on file prior to encounter.     Current Outpatient Prescriptions on File Prior to Encounter   Medication Sig Dispense Refill    alendronate (FOSAMAX) 70 MG tablet       ALPHAGAN P  0.1 % ophthalmic solution       bacitracin 500 UNIT/GM ointment Apply small amount to affected area on face twice daily 1 Tube 1    CALCIUM CARBONATE 600 MG OR TABS 1 tablet 2 times daily one month 11    ciprofloxacin (CIPRO) 500 MG tablet Take 1 tablet by mouth daily. 30 tablet 5    furosemide (LASIX) 20 MG tablet Take 2 tablets by mouth daily. 90 tablet 0    glipiZIDE (GLUCOTROL) 5 MG tablet Take 0.5 tablets (2.5 mg) by mouth 2 times daily. 30 tablet 1    insulin glargine (LANTUS) 100 UNIT/ML injection Inject 20 Units under the skin daily. 10 mL 1    lactulose 10 GM/15ML solution Take 30 mLs (20 g) by mouth 3 times daily. 946 mL 0    LUMIGAN 0.01 %       multivitamin (MULTI-VITAMIN) tablet Take 1 tablet by mouth daily.      ranitidine (ZANTAC) 300 MG tablet Take 300 mg by mouth 2 times daily.      rifaximin (XIFAXAN) 550 MG tablet Take 550 mg by mouth every 12 hours.      sitagliptin (JANUVIA) 50 MG tablet Take 1 tablet (50 mg) by mouth daily. 60 tablet 1    spironolactone (ALDACTONE) 50 MG tablet Take 1 tablet (50 mg) by mouth daily. 30 tablet 0    VITAMIN D 400 UNIT OR CAPS 1 CAPSULE DAILY 30 11     Allergies:  No known allergies    Objective  Vitals:  Temperature:  [98.5 F (36.9 C)] 98.5 F (36.9 C) (02/13 1024)  Blood pressure (BP): (134-139)/(43-56) 134/56 mmHg (02/13 1501)  Heart Rate:  [84-90] 84 (02/13 1501)  Respirations:  [17-18] 18 (02/13 1501)  Pain Score:  [-] 4 (02/13 1501)  O2 Device:  [-]   SpO2:  [97 %-98 %] 97 % (02/13 1501)      Weights (last 14 days)     Date/Time Weight Who    09/26/2014 1024 52.617 kg (116 lb) CB              Exam:   Gen: Alert, cooperative, NAD  Head: non-icteric sclera, PERRLA, EOMI, MMM, OP clear  Neck: No masses, LAD or thyromegaly  Heart: RRR, no m,g,r, no JVD or carotid bruits. Peripheral pulses 2+  Lungs: CTAB no crackles, rales, or wheezes  Abd: Soft, NT, ND, BS+, No organomegaley, No CVA tenderness. No stigmata of cirrhosis.  Ext: warm and well  perfused, peripheral pulses intact, R LE edema and mild erythema compared to  L leg.  Neuro: A&Ox3, CN II-IX intact, strength and sensation grossly intact, symmetric reflexes. No asterixis.  Skin: No scars, rash, bruising, jaundice or dry skin.  MSK: No muscle wasting or musculoskeletal pain.    Labs  Recent Labs      09/14/2014   1105   NA  135*   K  3.7   CL  99   BICARB  24   BUN  13   CREAT  0.74   [place]  7.8*   MG  1.8   TP  6.1   ALB  2.4*   TBILI  4.05*   DBILI  1.2*   AST  42*   ALT  29   ALK  98     Recent Labs      09/12/2014   1100   WBC  7.0   HGB  11.4   HCT  30.6*   MCV  90.0   PLT  62*   SEG  87*   LYMPHS  6*   MONOS  7       Recent Labs      09/12/2014   1100   PTT  33.4   INR  1.7     Recent Labs      09/22/2014   1105   TROPONIN  0.02     Additional important labs:     Ammonia: 123  Lactate 2.4    A1C 8.8%    UA   09/12/2014 14:02   Type Not Specified   Color Amber (A)   Appearance Slightly Hazy (A)   Specific Gravity 1.026   pH 5.0   Protein 1+ (A)   Glucose 2+ (A)   Ketones Negative   Leuk Esterase 2+ (A)   Nitrite Negative   Bilirubin Negative   Blood 3+ (A)   Urobilinogen 1+ (A)   WBC 21-50 (A)   RBC >50 (A)   Bacteria Few (A)   Squam. Epithelial Cell 11-20(MOD) (A)   Mucus Few (A)   Tran. Epithelial Cell Few (A)   Hyaline Cast 0-2     Micro  Blood Clx 2/13: ngtd  Urine Culture: pending    EKG  NSR, no evidence of acute ischemia    Imaging  CXR:   Decreased but persistent small right pleural effusion, significantly improved from 09/12/2014. TIPS noted. No consolidation.    CT Head 2/13:  1. No evidence of acute intracranial hemorrhage or findings to suggest   territorial/transcortical infarct.  2. Mild white matter changes. These may be associated with prior small vessel   ischemia. If there is concern for encephalopathy, MRI would be more helpful   for acute evaluation. MRI screening: No intracranial or intraorbital metal.  3. Frothy secretions within the right sphenoid sinuses well as reactive bone      changes, suggestive of acute on chronic sinusitis.    ASSESSMENT AND PLAN  74 year old woman with pmh of HCV (GT2, achieved SVR), Cirrhosis (c/b ascites, HE, EV) s/p TIPS (06/2014) and recent TIPS revision (09/14/14), DM2, HTN, and recent UTI treated with 4 day course of abx by non-[facility] PCP who presents with 4 days of nausea, vomiting, and 1 day of altered mental status.    Acute Hospital Problems  # Altered Mental Status: Given her known history of hepatic encephalopathy and missed doses of lactulose, s/p TIPS procedure, elevated ammonia, HE is the most likely cause. However, cannot exclude dehydration and possibly UTI as well. On my  exam, patient was A&O x4 without significant confusion, infection unlikely to resolve that quickly. CT head negative for acute intracranial process  - Restart home Lactulose and Rifaximin, titrate to 3-5 BM per day  - Low threshold for delirium precautions given age and hospitalization    # Pyuria: Urinalysis with pyuria, however, not a clean catch with many squamous epithelial cells. Now s/p 1 dose CTX in ED.  - Repeat UA and Urine Culture via straight cath to confirm  - holding on Abx    # Dehydration: Given nausea, vomiting, and poor PO intake will give gentle IV volume replacement.  - 500 cc ns bolus x1  - Daily BMP    # R lower extremity swelling: Concern for DVT given size mismatch  - bilateral LE doppler studies  - VTE ppx with Heparin 5000 q12h    # Elevated Lactate: unsure of baseline, no recent test in ~2 years, may be from liver dysfunction vs dehydration  - trend lactate, will order another ~23:00, then in AM  - bolus 500cc NS if increasing, no further interventions if improving    Chronic Problems  # Cirrhosis: 2/2 HCV, previously c/b HE, EV, ascites s/p TIPS with recent revision. Appears well compensated on exam; however, AMS may be 2/2 HE. No ascites on ultrasound, unlikely SBP causing symptoms.  - Lactulose and Rifaximin as above  - Holding Lasix and Spironolactone  because we are working to increase intravascular volume  - Can restart diuretics if clinically improves and/or if becomes overloaded  - con't home SBP PPx with Cipro 500 qday    # HCV (geno 2, achieved SVR): follows with hepatology as outpatient, contracted from blood transfusion.  - no acute intervention  - standard precautions    # DM2: on 20 units glargine daily, Januvia and Glipizide. Last A1c 8.1% 06/2014.  - repeat a1c  - glargine 10 units qday (using caution with poor PO intake) can titrate back if she requires  - low intensity ssi  - holding oral anti hyperglycemics    #Osteoporosis: [place] and Vit D home dose, holding bisphosphonates  #Gerd: Pepcid 20 qday    # Nutrition: Diet Clear Liquid Zero Carb Clear Liquid  # Electrolytes: replacing as needed  # DVT Prophylaxis: hep sub q 5000 q12h  # Dispo: med/surg for resolution of AMS    This patient was seen and discussed with D[place], Gerre Pebbles, MD who agrees with the plan.    Stefanie Libel) Georgie Chard, MD PGY1   [facility]  Pg [phone]

## 2014-09-24 LAB — GLUCOSE (POCT)
GLUCOSE (POCT): 228 mg/dL — ABNORMAL HIGH (ref 70–115)
GLUCOSE (POCT): 243 mg/dL — ABNORMAL HIGH (ref 70–115)
GLUCOSE (POCT): 249 mg/dL — ABNORMAL HIGH (ref 70–115)
GLUCOSE (POCT): 216 mg/dL — ABNORMAL HIGH (ref 70–115)

## 2014-09-24 LAB — BASIC METABOLIC PANEL, BLOOD
ANION GAP: 13 mmol/L (ref 7–15)
BUN: 13 mg/dL (ref 6–20)
Bicarbonate: 20 mmol/L — ABNORMAL LOW (ref 22–29)
CALCIUM: 7.8 mg/dL — ABNORMAL LOW (ref 8.5–10.6)
CHLORIDE: 100 mmol/L (ref 98–107)
Creatinine: 0.68 mg/dL (ref 0.51–0.95)
GFR: >60 mL/min
Glucose: 257 mg/dL — ABNORMAL HIGH (ref 70–115)
Potassium: 3.4 mmol/L — ABNORMAL LOW (ref 3.5–5.1)
Sodium: 133 mmol/L — ABNORMAL LOW (ref 136–145)

## 2014-09-24 LAB — LACTATE, BLOOD
Lactate: 2 mmol/L (ref 0.5–2.2)
Lactate: 2.6 mmol/L — ABNORMAL HIGH (ref 0.5–2.2)
Lactate: 4.3 mmol/L — ABNORMAL HIGH (ref 0.5–2.2)

## 2014-09-24 LAB — VENOUS BLOOD GAS
BE, VEN: -1.2 mmol/L (ref <=2.3)
FIO2, VEN: 100 %
HCO3, Ven: 22 mmol/L — ABNORMAL LOW (ref 23–28)
O2 SAT, VEN (EST): 96.9 %
PCO2, VEN (T): 32 mmHg — ABNORMAL LOW (ref 40–52)
PCO2, VEN (UNCORR): 32 mmHg — ABNORMAL LOW (ref 40–52)
PH, VEN (T): 7.45 — ABNORMAL HIGH (ref 7.33–7.40)
PH, VEN (UNCORR): 7.45 — ABNORMAL HIGH (ref 7.33–7.40)
PO2, VEN (T): 85 mmHg — ABNORMAL HIGH (ref 25–44)
PO2, VEN (UNCORR): 85 mmHg — ABNORMAL HIGH (ref 25–44)
TEMP, VEN: 37.1 'C

## 2014-09-24 LAB — GLYCOSYLATED HGB(A1C), BLOOD: Glyco Hgb (A1C): 6.2 % — ABNORMAL HIGH (ref 4.8–5.9)

## 2014-09-24 LAB — MAGNESIUM, BLOOD: MAGNESIUM: 1.9 mg/dL (ref 1.7–2.6)

## 2014-09-24 LAB — HEMOGRAM, BLOOD
HGB: 10.9 gm/dL — ABNORMAL LOW (ref 11.2–15.7)
Hct: 29.7 % — ABNORMAL LOW (ref 34.0–45.0)
MCH: 33.1 pg — ABNORMAL HIGH (ref 26.0–32.0)
MCHC: 36.7 % — ABNORMAL HIGH (ref 32.0–36.0)
MCV: 90.3 um3 (ref 79.0–95.0)
MPV: 10 fL (ref 9.4–12.4)
Plt Count: 62 10*3/uL — ABNORMAL LOW (ref 140–370)
RBC: 3.29 10*6/uL — ABNORMAL LOW (ref 3.90–5.20)
RDW: 18.2 % — ABNORMAL HIGH (ref 12.0–14.0)
WBC: 8.7 10*3/uL (ref 4.0–10.0)

## 2014-09-24 LAB — PHOSPHORUS, BLOOD: PHOSPHOROUS: 2.1 mg/dL — ABNORMAL LOW (ref 2.7–4.5)

## 2014-09-24 MED ORDER — K PHOS DI & MONO-SOD PHOS MONO 155-852-130 MG OR TABS
250.00 mg | ORAL_TABLET | Freq: Once | ORAL | Status: AC
Start: 2014-09-24 — End: 2014-09-24
  Administered 2014-09-24: 1 via ORAL
  Filled 2014-09-24: qty 1

## 2014-09-24 MED ORDER — SODIUM CHLORIDE 0.9 % IV BOLUS
500.00 mL | INJECTION | Freq: Once | INTRAVENOUS | Status: AC
Start: 2014-09-24 — End: 2014-09-24
  Administered 2014-09-24: 500 mL via INTRAVENOUS

## 2014-09-24 MED ORDER — PROCHLORPERAZINE MALEATE 5 MG OR TABS
5.00 mg | ORAL_TABLET | Freq: Once | ORAL | Status: AC
Start: 2014-09-24 — End: 2014-09-25
  Filled 2014-09-24: qty 1

## 2014-09-24 MED ORDER — DEXTROSE-NACL 5-0.9 % IV SOLN (CUSTOM)
INTRAVENOUS | Status: DC
Start: 2014-09-24 — End: 2014-09-24

## 2014-09-24 MED ORDER — METOCLOPRAMIDE HCL 5 MG/ML IJ SOLN
10.00 mg | Freq: Once | INTRAMUSCULAR | Status: AC
Start: 2014-09-24 — End: 2014-09-24
  Administered 2014-09-24: 10 mg via INTRAVENOUS
  Filled 2014-09-24: qty 2

## 2014-09-24 MED ORDER — SODIUM CHLORIDE 0.9 % IV SOLN
INTRAVENOUS | Status: AC
Start: 2014-09-24 — End: 2014-09-25
  Administered 2014-09-24: 15:00:00 via INTRAVENOUS

## 2014-09-24 MED ORDER — POTASSIUM CHLORIDE CRYS CR 10 MEQ OR TBCR
40.00 meq | EXTENDED_RELEASE_TABLET | Freq: Two times a day (BID) | ORAL | Status: AC
Start: 2014-09-24 — End: 2014-09-24
  Administered 2014-09-24 (×2): 40 meq via ORAL
  Filled 2014-09-24 (×2): qty 4

## 2014-09-24 MED ORDER — INSULIN LISPRO (HUMAN) 100 UNIT/ML SC SOLN (CUSTOM)
2.0000 [IU] | Freq: Three times a day (TID) | INTRAMUSCULAR | Status: DC
Start: 2014-09-24 — End: 2014-09-27
  Administered 2014-09-26: 18:00:00 1 [IU] via SUBCUTANEOUS

## 2014-09-24 MED ORDER — PROMETHAZINE HCL 25 MG OR TABS
25.0000 mg | ORAL_TABLET | Freq: Four times a day (QID) | ORAL | Status: DC | PRN
Start: 2014-09-24 — End: 2014-09-26
  Administered 2014-09-24 – 2014-09-25 (×3): 25 mg via ORAL
  Filled 2014-09-24 (×3): qty 1

## 2014-09-24 MED ORDER — SODIUM CHLORIDE 0.9 % IV SOLN
1000.0000 mg | INTRAVENOUS | Status: DC
Start: 2014-09-24 — End: 2014-09-25
  Administered 2014-09-24: 1000 mg via INTRAVENOUS
  Filled 2014-09-24: qty 1000

## 2014-09-24 MED ORDER — PROCHLORPERAZINE EDISYLATE 5 MG/ML IJ SOLN
10.0000 mg | Freq: Four times a day (QID) | INTRAMUSCULAR | Status: DC | PRN
Start: 2014-09-24 — End: 2014-10-14
  Administered 2014-09-25 (×2): 10 mg via INTRAVENOUS
  Filled 2014-09-24 (×3): qty 2

## 2014-09-24 NOTE — Progress Notes (Addendum)
Medicine Inpatient Progress Note    Patient Name: Paula Manning        MRN: [id number]        Room:  1003/1003A   Hospital Day: 2      Admitted: 10/06/2014 11:17 AM     O/N events: emesis x 1  Subjective: noted 5x diarrhea after starting lactulose.  Poor appetite.  No confusion per patient and per family who are at bedside.      Antibiotic(s): (start date - end date)  ceftriaxone  09/21/2014 at ED  Keflex (outpatient UTI)   Completed 4 day course 4 days prior to admission    ---------------------------------------------------------------------------------------------------  Objective:  BP  Min: 127/35  Max: 164/51  Temp  Min: 98.3 F (36.8 C)  Max: 99.6 F (37.6 C)  Pulse  Min: 84  Max: 101  Resp  Min: 15  Max: 18  SpO2  Min: 98 %  Max: 100 %  02/13 0600 - 02/14 0559  In: 503 [I.V.:503]  Out: 250 [Urine:250]  No Data Recorded   Weights (last 14 days)     Date/Time Weight Who    09/29/2014 1024 52.617 kg (116 lb) CB             Percentage Weight Change (%): 0 %     General: elderly woman, seems weak and fragile, low energy   HEENT: anicteric sclera, EOMI, clear oropharynx, supple neck   Lungs: mild crackles at bilateral bases otherwise clear   Heart: regular rate, normal S1 and S2, no m/r/g   Abdomen: soft, +BS, no pain on palpation.    Extremities: wwp, no pitting edema     Pertinent labs (see EPIC for complete list):     WBC/HGB/HCT/PLT:  8.7/10.9/29.7/62 (02/14 0733)  BMP:  133/3.4/100/20/13/0.68/257 (02/14 4235) Mg/Phos:  1.9/2.1 (02/14 3614)  Liver Panel:  39/27/3.34/--/91/5.6/2.2 (02/13 2025)    Lactate: 2.4 -> 2.0 (after fluid) -> 2.5 (today)    UA (straight cath): 0-2 WBC.  1+ protein, 21-50 RBC, 3+ blood.    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  Patient is stable currently with improved mentation but still have poor PO intake.    * AMS: improved.  Ddx: HE in setting of dehydration (N/V, poor PO intake) vs UTI (although repeat UA is non-pyruic).  CT head with no acute intracranial process.  - continue NS 50cc/hr for 10 hrs.  - continue lactulose, titrate 2-4 BM  - not going to treat for UTI given repeat UA and negative culture thus far    * nausea/vomiting: possible viral gastritis.  Post cholecystomy so unlikely cholestasis.  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  SBP (although already on prophylaxis with benign exam).  History not consistent with food poisoning.  History also not from central process.  - phenergan prn     * elevated lactate: uptrended this AM.  No overt signs of infections, although patient has persistent n/v.  ? Liver dysfunction  - fluids as above.  - trend lactate    * diarrhea: likely secondary to lactulose, but given recent  Abx treatment, n/v and AMS, will check for c diff  - pending C diff studies.    * R lower extremity swelling: pending LE doppler studies    * cirrhosis: HCV likely culprit.  Previously with HE, EV, ascites post TIPS and revision, SBP.  Mental status clear today, no signs of HE.  - HE: lactulose and rifaximin  - ascites: none on exam today.  U/S with no pocket. Holding diuretics given poor PO intake, dehydration, and elevating lactate   - SBP - low suspicion, continue cipro 500mg  QDay ppx.  - EV - post TIPS, good flow on admission U/S.  No indication for propanolol.     Stable/Inactive/Chronic:  - DM2 - Hgb A1c 6.2 (09/24/2014).  Inpatient sugar > 200, on lantus 10U.  Will add 2U aspart during meal time.  Continue SSI and hypoglycemic protocol  - osteoporosis -continue home [place] and Vit D.  Holding bisphosphonates  - gerd: pepcid 20  QDay    FEN: carb limited, repleted K and phos  PPx: subQ heparin 5Kunit q12hr  Dispo: improve PO tolerance                                         #---   Full Code    ---#    Staffed with attending Dr. Ascencion Dike    --Margaretha Sheffield, MD PhD  Internal Medicine, PGY-2  Pager [phone]      Current Medications   cholecalciferol  400 Units Daily    ciprofloxacin  500 mg Daily    famotidine  20 mg Daily    heparin  5,000 Units Q12H    insulin glargine  10 Units QAM    insulin lispro  1-10 Units 4x Daily WC    lactulose  20 g TID    multivitamin  1 tablet Daily    prochlorperazine  5 mg Once    rifaximin  550 mg Q12H    sodium chloride (PF)  3 mL Q8H       dextrose  12.5 g PRN    glucagon  1 mg Once PRN    glucose  4 tablet PRN    glucose  1 Tube PRN    nalOXone  0.1 mg Q2 Min PRN    sodium chloride (PF)  3 mL PRN    sodium chloride   Continuous PRN       sodium chloride      sodium chloride 50 mL/hr at 09/24/14 1456        ATTENDING PROGRESS NOTE ATTESTATION    Subjective    Patient with persistent nausea, new diarrhea overnight without worsening abdominal pain. No dysuria.     Objective    I have examined the patient and concur with the resident exam.    Assessment and Plan    I agree with the resident care plan.    See the resident note for further details.    Noberto Retort, M.D.  Hospital Medicine Attending  PID [phone]

## 2014-09-24 NOTE — Plan of Care (Signed)
Problem: Discharge Planning  Goal: Participation in care planning  Pt compliant with plan of care up to this hour. Will continue to monitor.     Problem: Falls, Risk of  Goal: Keep patient free from falls utilizing universal fall precautions  Pt educated to use the call light before getting OOB. Call light within reach, 2 side rails up, bed in low locked position, frequent rounding, family at bedside. Pt calls appropriately before getting OOB. Will continue to monitor.   Goal: Ensure safe mobility of patient (Mobility)  Pt educated to use the call light before getting OOB. Call light within reach, 2 side rails up, bed in low locked position, frequent rounding, family at bedside. Pt calls appropriately before getting OOB. Will continue to monitor.       Goal: Minimize the risk of falls due to the effects of prescribed medications (Medications)  Pt educated to use the call light before getting OOB. Call light within reach, 2 side rails up, bed in low locked position, frequent rounding, family at bedside. Pt calls appropriately before getting OOB; pt on lactulose. Will continue to monitor.         Problem: Tissue Perfusion, Cardiopulmonary - Altered  Goal: Hemodynamic stability  Blood pressure 137/50, pulse 92 with A fib, temperature 99.6 F (37.6 C), resp. rate 18, SpO2 100 % on RA. Primary team made aware of A fib and EKG done with no new orders. MD aware of no CV medications up to this hour. Will continue to monitor.         Problem: Pain - Acute  Goal: Communication of presence of pain  Pt educated on numeric pain scale with no questions. Pt denies pain up to this hour. Pt aware to verbalize the presence of pain to the nurse. Will continue to monitor.     Problem: Alteration in Blood Glucose  Goal: Glucose level within specified parameters  Pt educated on blood glucose checks AC+HS and goals for BG. Pt educated on insulin coverage. Will continue to monitor.

## 2014-09-24 NOTE — Interdisciplinary (Signed)
Pt vomited. Text message md on call for antiemetic and reported temp 99.6 and vital signs for 2300.  No response or order seen. Pt no more vomiting up to this time. Will cont to monitor. Report given to her  Primary RN  Fanny Skates

## 2014-09-24 NOTE — Plan of Care (Signed)
Problem: Discharge Planning  Goal: Participation in care planning  Outcome: Met  Pt and family compliant with care plan, goal ongoing.    Problem: Falls, Risk of  Goal: Keep patient free from falls utilizing universal fall precautions  Outcome: Met  No falls sustained this shift, pt has made no attempts to get OOB, call light within reach, pt family at bedside.    Problem: Tissue Perfusion, Cardiopulmonary - Altered  Goal: Hemodynamic stability  Outcome: Met  Vss, a-fib on tele monitor with short period of conversion to ST this morning.  Denies CP/SOB, in NAD.    Problem: Pain - Acute  Goal: Communication of presence of pain  Outcome: Met  Pt denies pain this shift.    Problem: Alteration in Blood Glucose  Goal: Glucose level within specified parameters  Outcome: Not Met  Pt glucose elevated, MD aware, pt continues on AC/HS checks, carb ltd diet.

## 2014-09-24 NOTE — Interdisciplinary (Signed)
Acknowledged nursing chewing trigger:  Pt w/ difficulty chewing d/t missing teeth and dentures. Pt reports she is able to tolerate mechanical soft items w/o dentures. Obtained food preferences. Relayed findings to RD.  Will f/u per policy.  Horris Latino, [place]

## 2014-09-25 ENCOUNTER — Telehealth (INDEPENDENT_AMBULATORY_CARE_PROVIDER_SITE_OTHER): Payer: Self-pay | Admitting: Vascular & Interventional Radiology

## 2014-09-25 DIAGNOSIS — K319 Disease of stomach and duodenum, unspecified: Secondary | ICD-10-CM

## 2014-09-25 DIAGNOSIS — I81 Portal vein thrombosis: Secondary | ICD-10-CM

## 2014-09-25 LAB — CBC WITH DIFF, BLOOD
ANC-Automated: 10.2 10*3/uL — ABNORMAL HIGH (ref 1.6–7.0)
ANC-Manual Mode: 9.7 10*3/uL — ABNORMAL HIGH (ref 1.6–7.0)
Abs Lymphs: 0.2 10*3/uL — ABNORMAL LOW (ref 0.8–3.1)
Abs Lymphs: 0.7 10*3/uL — ABNORMAL LOW (ref 0.8–3.1)
Abs Monos: 0.3 10*3/uL (ref 0.2–0.8)
Abs Monos: 0.6 10*3/uL (ref 0.2–0.8)
BANDS: 6 % (ref 0–15)
HGB: 11 gm/dL — ABNORMAL LOW (ref 11.2–15.7)
HGB: 11.6 gm/dL (ref 11.2–15.7)
Hct: 29.9 % — ABNORMAL LOW (ref 34.0–45.0)
Hct: 31.7 % — ABNORMAL LOW (ref 34.0–45.0)
LYMPHOCYTES: 2 % — ABNORMAL LOW (ref 19–53)
LYMPHOCYTES: 6 % — ABNORMAL LOW (ref 19–53)
MCH: 32.8 pg — ABNORMAL HIGH (ref 26.0–32.0)
MCH: 33.1 pg — ABNORMAL HIGH (ref 26.0–32.0)
MCHC: 36.8 % — ABNORMAL HIGH (ref 32.0–36.0)
MCHC: 36.6 % — ABNORMAL HIGH (ref 32.0–36.0)
MCV: 89.3 um3 (ref 79.0–95.0)
MCV: 90.6 um3 (ref 79.0–95.0)
MONOCYTES: 3 % — ABNORMAL LOW (ref 5–12)
MONOCYTES: 5 % (ref 5–12)
MPV: 9.7 fL (ref 9.4–12.4)
MPV: 9.3 fL — ABNORMAL LOW (ref 9.4–12.4)
NUMBER OF CELLS COUNTED: 117
Plt Count: 60 10*3/uL — ABNORMAL LOW (ref 140–370)
Plt Count: 69 10*3/uL — ABNORMAL LOW (ref 140–370)
Plt Est: DECREASED
RBC: 3.35 10*6/uL — ABNORMAL LOW (ref 3.90–5.20)
RBC: 3.5 10*6/uL — ABNORMAL LOW (ref 3.90–5.20)
RDW: 18.4 % — ABNORMAL HIGH (ref 12.0–14.0)
RDW: 18.7 % — ABNORMAL HIGH (ref 12.0–14.0)
Segs: 89 % — ABNORMAL HIGH (ref 34–71)
Segs: 88 % — ABNORMAL HIGH (ref 34–71)
WBC: 10.2 10*3/uL — ABNORMAL HIGH (ref 4.0–10.0)
WBC: 11.5 10*3/uL — ABNORMAL HIGH (ref 4.0–10.0)

## 2014-09-25 LAB — MRSA SURVEILLANCE CULTURE

## 2014-09-25 LAB — BASIC METABOLIC PANEL, BLOOD
ANION GAP: 16 mmol/L — ABNORMAL HIGH (ref 7–15)
BUN: 22 mg/dL — ABNORMAL HIGH (ref 6–20)
Bicarbonate: 21 mmol/L — ABNORMAL LOW (ref 22–29)
CALCIUM: 7.9 mg/dL — ABNORMAL LOW (ref 8.5–10.6)
CHLORIDE: 99 mmol/L (ref 98–107)
Creatinine: 0.68 mg/dL (ref 0.51–0.95)
GFR: >60 mL/min
Glucose: 243 mg/dL — ABNORMAL HIGH (ref 70–115)
Potassium: 4.2 mmol/L (ref 3.5–5.1)
Sodium: 136 mmol/L (ref 136–145)

## 2014-09-25 LAB — LACTATE, BLOOD: Lactate: 4 mmol/L — ABNORMAL HIGH (ref 0.5–2.2)

## 2014-09-25 LAB — LIVER PANEL, BLOOD
ALBUMIN: 2.4 g/dL — ABNORMAL LOW (ref 3.5–5.2)
ALKALINE PHOS: 98 U/L (ref 35–140)
ALT (SGPT): 25 U/L (ref 0–33)
AST (SGOT): 32 U/L (ref 0–32)
BILIRUBIN, DIR: 1.3 mg/dL — ABNORMAL HIGH (ref <0.2)
BILIRUBIN, TOT: 3.56 mg/dL — ABNORMAL HIGH (ref <1.20)
Total Protein: 6 g/dL (ref 6.0–8.0)

## 2014-09-25 LAB — PROTHROMBIN TIME, BLOOD
INR: 1.7
PT,PATIENT: 18.7 s — ABNORMAL HIGH (ref 9.7–12.5)

## 2014-09-25 LAB — GASTROINTESTINAL PATHOGEN NUCLEIC ACID DETECTION TEST: GASTROINTESTINAL PATHOGEN NUCLEIC ACID DETECTION TEST: NOT DETECTED

## 2014-09-25 LAB — GLUCOSE (POCT)
GLUCOSE (POCT): 220 mg/dL — ABNORMAL HIGH (ref 70–115)
GLUCOSE (POCT): 255 mg/dL — ABNORMAL HIGH (ref 70–115)
GLUCOSE (POCT): 305 mg/dL — ABNORMAL HIGH (ref 70–115)
GLUCOSE (POCT): 328 mg/dL — ABNORMAL HIGH (ref 70–115)

## 2014-09-25 LAB — PHOSPHORUS, BLOOD: PHOSPHOROUS: 2.4 mg/dL — ABNORMAL LOW (ref 2.7–4.5)

## 2014-09-25 LAB — URINE CULTURE: URINE CULTURE RESULT: NO GROWTH

## 2014-09-25 LAB — MAGNESIUM, BLOOD: MAGNESIUM: 2.1 mg/dL (ref 1.7–2.6)

## 2014-09-25 MED ORDER — METRONIDAZOLE IN NACL 5-0.79 MG/ML-% IV SOLN
500.0000 mg | Freq: Three times a day (TID) | INTRAVENOUS | Status: DC
Start: 2014-09-25 — End: 2014-09-25
  Administered 2014-09-25: 500 mg via INTRAVENOUS
  Filled 2014-09-25: qty 100

## 2014-09-25 MED ORDER — SODIUM CHLORIDE 0.9 % IV SOLN
2000.0000 mg | INTRAVENOUS | Status: DC
Start: 2014-09-25 — End: 2014-09-25

## 2014-09-25 MED ORDER — ALBUMIN HUMAN 25 % IV SOLN
25.0000 g | Freq: Two times a day (BID) | INTRAVENOUS | Status: DC
Start: 2014-09-25 — End: 2014-09-29
  Administered 2014-09-25 – 2014-09-28 (×7): 25 g via INTRAVENOUS
  Filled 2014-09-25 (×7): qty 100

## 2014-09-25 MED ORDER — METRONIDAZOLE 500 MG OR TABS
500.0000 mg | ORAL_TABLET | Freq: Three times a day (TID) | ORAL | Status: DC
Start: 2014-09-25 — End: 2014-09-25

## 2014-09-25 MED ORDER — SODIUM CHLORIDE 0.9 % IV SOLN
INTRAVENOUS | Status: AC
Start: 2014-09-25 — End: 2014-09-25
  Administered 2014-09-25: 13:00:00 via INTRAVENOUS

## 2014-09-25 NOTE — Plan of Care (Signed)
Problem: Skin Integrity- Intact patient high risk for impaired skin integrity Braden scale less than or equal to 18  Goal: Skin remains intact  Outcome: Met  Pt skin remains intact, pt with nausea, weakness, in bed most of shift.  Encouraged pt to turn and be repositioned, compliant with care, family supportive.

## 2014-09-25 NOTE — Telephone Encounter (Signed)
Paged by operator on the morning on 12/13 by Heloise Ochoa. Paula Manning reported that Kathrin Greathouse was feeling more confused lately. I encouraged him to come to Emergency department for further evaluation. Paula Manning understood and said that Paula Manning would come immediately.

## 2014-09-25 NOTE — Consults (Signed)
Hepatology Initial Consult Note  Consult Attending: Dr. 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 Majority of history from son, reports that she has been confused for the past 1-2 days at home, hasn't been taking her lactulose because she was more confused. Also with N/V and intermittent epigastric abdominal pain as well as non-bloody diarrhea at least 5 episodes daily. Furthermore, also with fever to 100.3, chills and sweats at home for the past 1-2 days. She denies any CP, SOB, cough, dysuria. Work-up here showed GI pathogen panel with norovirus.  Still on lasix 20, aldactone 50. Lives with husband, who doesn't always regulate her very well and lets her go without taking her medications if she's not feeling like it per son.     Past Medical History:  Past Medical History   Diagnosis Date   • Migraine headache    • Glaucoma    • Ascites 06/29/2007     History of paracentesis x 2   • Pancreatic cyst 06/29/2007   • Cirrhosis (HCC) 06/24/2007     Active on the liver transplant waiting list with a blood type of O positive. 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 Due to Hep C   • Osteopenia 06/24/2007   • Emphysema 06/24/2007   • EV (esophageal varices) (HCC) 06/10/2007   • Anemia 06/10/2007   • HCV (hepatitis C virus) 06/10/2007     Achieved SVR.   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, not stated as uncontrolled(250.70) 06/10/2007   • HTN 06/10/2007   • HTN 06/10/2007       Past Surgical History:   Past Surgical History   Procedure Laterality Date   • Pb laps surg cholecstc w/expl common duct  1970s     in [place];  open CCX   • Pb cesarean delivery only     • Pb cstoplasty/cstourtp plstc any       Bladder suspension   • Egd procedure  06/01/2013     Procedure: GI EGD;  Surgeon: Kuo, Alexander, MD;  Location: [facility];  Service: Gastroenterology;;   • Tips procedure         Allergies:  No Known Allergies    Home Medications:  Prescriptions prior to admission   Medication Sig Dispense Refill Last Dose   • alendronate (FOSAMAX) 70 MG tablet    09/22/2014   • ALPHAGAN P 0.1 % ophthalmic solution    09/22/2014   • bacitracin 500 UNIT/GM ointment Apply small amount to affected area on face twice daily 1 Tube 1 09/22/2014   • CALCIUM CARBONATE 600 MG OR TABS 1 tablet 2 times daily one month 11 09/22/2014   • ciprofloxacin (CIPRO) 500 MG tablet Take 1 tablet by mouth daily. 30 tablet 5 09/22/2014   • furosemide (LASIX) 20   MG tablet Take 2 tablets by mouth daily. 90 tablet 0 09/22/2014   • glipiZIDE (GLUCOTROL) 5 MG tablet Take 0.5 tablets (2.5 mg) by mouth 2 times daily. 30 tablet 1 09/22/2014   • insulin glargine (LANTUS) 100 UNIT/ML injection Inject 20 Units under the skin daily. 10 mL 1 09/22/2014   • lactulose 10 GM/15ML solution Take 30 mLs (20 g) by mouth 3 times daily. 946 mL 0 09/22/2014   • LUMIGAN 0.01 %    09/22/2014   • multivitamin (MULTI-VITAMIN) tablet Take 1 tablet by mouth daily.   09/22/2014   • ranitidine (ZANTAC) 300 MG tablet Take 300 mg by mouth 2 times daily.   09/22/2014   • rifaximin (XIFAXAN) 550 MG tablet Take 550 mg by mouth every 12 hours.   09/22/2014   • sitagliptin (JANUVIA) 50 MG tablet Take 1 tablet (50 mg) by mouth daily. 60 tablet 1 09/22/2014   • spironolactone (ALDACTONE) 50 MG tablet Take 1 tablet (50 mg) by mouth daily. 30 tablet 0 09/22/2014   •  VITAMIN D 400 UNIT OR CAPS 1 CAPSULE DAILY 30 11 09/22/2014       Inpatient Medications:  Scheduled Meds  • cholecalciferol  400 Units Daily   • famotidine  20 mg Daily   • heparin  5,000 Units Q12H   • insulin glargine  10 Units QAM   • insulin lispro  1-10 Units 4x Daily WC   • insulin lispro  2 Units TID WC   • lactulose  20 g TID   • multivitamin  1 tablet Daily   • rifaximin  550 mg Q12H   • sodium chloride (PF)  3 mL Q8H     IV Meds  • sodium chloride     • sodium chloride 200 mL/hr at 09/25/14 1256     Social History:   Lives with husband. Has 10 children. Was a homemaker.  Denies alcohol, drugs, tobacco.     Review of Systems: -   Constitutional: Denies fevers, chills, weight loss  ENT: negative for eye changes, oral lesions, difficulty swallowing  Cardiac: Denies chest pain or palpitations  Lungs: Denies cough or shortness of breath  GI: Negative except noted in the HPI  GU: Denies dysuria, hematuria  Skin: Denies rashes or other skin lesions  MSK: Denies joint deformities, new joint pain  Lymph: Denies enlarged nodes, night sweats, abnormal bleeding  Psych: negative for depressed mood, anxiety  Neuro: negative for confusion, weakness, sensory changes    OBJECTIVE:   Vitals signs:   Latest Entry  Range (last 24 hours)    Temperature: 98.2 °F (36.8 °C)  Temp  Avg: 98.1 °F (36.7 °C)  Min: 97.3 °F (36.3 °C)  Max: 98.7 °F (37.1 °C)    Blood pressure (BP): 156/51 mmHg  BP  Min: 131/42  Max: 164/51    Heart Rate: 105  Pulse  Avg: 93.8  Min: 86  Max: 105    Respirations: 17  Resp  Avg: 16.1  Min: 14  Max: 18    SpO2: 100 %  SpO2  Avg: 99.4 %  Min: 97 %  Max: 100 %       No Data Recorded     Weight - scale: 52.617 kg (116 lb)  Percentage Weight Change (%): 0 %    Intake/Output       09/24/14 0600 - 09/25/14 0559 09/25/14 0600 - 09/26/14 0559      0600-1759 1800-0559 Total 0600-1759   1696-7893 Total       Intake    P.O.  300  -- 300  480  -- 480    I.V.  105.5  1100.8 1206.3  100  -- 100    Total Intake 405.5 1100.8  1506.3 580 -- 580       Output    Total Output -- -- -- -- -- --       Net I/O     405.5 1100.8 1506.3 580 -- 580        Respiratory support:  Oxygen Therapy  SpO2: 100 %  O2 Device: Nasal cannula  O2 Flow Rate (L/min): 2 l/min     Physical Exam:  General:  Well developed, thin elderly Hispanic female in no apparent distress, somnolent, a&o to person and place (never oriented to date per son)  HEENT:  No scleral icterus, Trachea midline, mucus membranes moist.  Lungs:  Clear to auscultation laterally. Normal respiratory effort.  Cardiovascular:  Regular rate and rhythm  Abdomen:  Abdomen soft, minimally tender to palpation throughout entire abdomen, bowel sound present. No peritoneal signs.  Extremities:  Trace peripheral edema RLE > LLE. Warm well-perfused.  Skin:  Non-icteric and no visible rash  Neuro:  Cranial nerves II-XII grossly intact. Unable to keep arms up long enough to perform asterixis  Psych:  Normal affect.    Labs:     CBC  Recent Labs      09/24/14   2114  09/25/14   0818   WBC  10.2*  11.5*   HGB  11.0*  11.6   HCT  29.9*  31.7*   PLT  60*  69*   BAND  6   --    SEG  89*  88*   LYMPHS  2*  6*   MONOS  3*  5      Chemistry  Recent Labs      09/24/14   0733  09/25/14   0818   NA  133*  136   K  3.4*  4.2   CL  100  99   BICARB  20*  21*   BUN  13  22*   CREAT  0.68  0.68   GLU  257*  243*   [place]  7.8*  7.9*   MG  1.9  2.1   PHOS  2.1*  2.4*     Recent Labs      09/24/2014   1105  09/19/2014   2025  09/25/14   0818   ALK  98  91  98   AST  42*  39*  32   ALT  _0 TBILI  4.05*  3.34*  3.56*   DBILI  1.2*   --   1.3*   ALB  2.4*  2.2*  2.4*        Coags  Recent Labs      10/01/2014   1100  09/25/14   0818   PT  18.4*  18.7*   PTT  33.4   --    INR  1.7  1.7       Radiology:   CT A/P w/ contrast 09/25/14: FINDINGS:  LUNG BASES: Small to moderate right pleural effusion with right lower lobe   atelectasis  LIVER/BILIARY:Diffuse nodularity and hepatic atrophy. Stable scattered tiny   low attenuation lesions  in the liver.  PANCREAS: Stable numerous pancreatic cysts.  SPLEEN: Unremarkable.  ADRENAL GLANDS: Unremarkable  KIDNEYS: Stable left renal cyst.  STOMACH/DUODENUM: Mild wall thickening from the distal stomach to the proximal  jejunum  VASCULATURE: Patent TIPS. Stable nonocclusive thrombus of the main portal vein  and superior mesenteric vein. New thrombosis of the left portal vein.  LYMPHATIC: Unremarkable  SMALL & LARGE BOWEL: Unremarkable  BLADDER/PELVIC ORGANS: Unremarkable  BONES/SOFT TISSUES: Unremarkable  OTHER: Trace pelvic ascites    IMPRESSION:  1) New thrombosis of the left portal vein. Vessel was patent on prior   abdominal ultrasound 09/21/2014  2) Edema of the distal stomach through proximal jejunum suggestive of   enteritis, nonspecific    CT head 09/29/2014: IMPRESSION:  1. No evidence of acute intracranial hemorrhage or findings to suggest   territorial/transcortical infarct.  2. Mild white matter changes. These may be associated with prior small vessel   ischemia. If there is concern for encephalopathy, MRI would be more helpful   for acute evaluation. MRI screening: No intracranial or intraorbital metal.  3. Frothy secretions within the right sphenoid sinuses well as reactive bone   changes, suggestive of acute on chronic sinusitis.    RUQ U/S 09/19/2014: FINDINGS:  The liver measures 11.3 cm in long axis. It is normal in echogenicity. No   focal lesions are identified. There is no intra or extra hepatic bile duct   dilation. The common bile duct measures four mm. The gallbladder is not   visualized. A patent TIPS catheter is present. The TIPS velocities are: Portal  end 115 ; mid 93 ; hepatic 125 cm/s. Flow in the right and left portal veins   is hepatofugal and towards the TIPS ; flow in the main portal vein is   hepatopetal.  The right kidney measures nine cm in long axis and is within normal limits   with no hydronephrosis or renal calculi.  The visualized portion of the pancreas again demonstrates  multiple cystic   lesions throughout the pancreas, as seen on prior imaging  The visualized aorta and inferior vena cava are within normal limits.  Right pleural effusion. No evidence of intra abdominal ascites.    IMPRESSION:  Patent TIPS catheter with normal velocities and appropriate flow in portal   veins.  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      Prior Endoscopy:   EGD 05/2013:  Findings: 1. Midline orophyarngeal prominence at the  arytenoid  2. Three columnns of grade 1 esophageal varices, no high  risk features  3. No gastric varices, mild portal hypertensive gastropathy  4. Few punctate clots in the antrum  5. Mild duodenal congestion consistent with portal  hypertension  Endoscopic Diagnosis: 1. Midline orophyarngeal  prominence at the arytenoid  2. Three columnns of grade 1 esophageal varices, no high  risk features  3. No gastric varices, mild portal hypertensive gastropathy  4. Few punctate clots in the antrum  5. 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 Continue propranolol 10 BID  2. ENT consultation to evaluate oropharyngeal findings  3. 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 Confusion associated with poor medication compliance, including lactulose, N/V, abd pain, diarrhea, and f/c/s. HDS. Work-up here showed GI pathogen panel with norovirus.     #)Norovirus: likely responsible for N/V, abd pain and diarrhea  - CT A/P w/out acute pathology  - Follow up blood cultures  - Supportive care with IVF and anti-emetics  - Would obtain  orthostatic vital signs --> if orthostatic, can give 500cc of NS  - Would give 25% 25g albumin BID     #)HCV GT2 cirrhosis c/b ascites, HE and diuretic resistant HHT now s/p TIPS 06/28/2014 and TIPS revision 09/14/14  - HE: Better per son, likely related to infection and forgetting lactulose   - Continue lactulose and rifaximin  - Ascites: No ascites on recent U/S  - EV's: S/p TIPS, no longer needs surveillance if TIPS is patent and she is stable  - HHT: S/p TIPS and TIPS revision   - RUQ U/S 2/13 with patent TIPS   - Hold diuretics while actively with norovirus (lasix 20, aldactone 50)  - Non-occlusive portal vein thrombosis: Multiple outpatient notes indicate poor medication adherence/knowledge, also very elderly, so no a/c  - [place] surveillance: Last CT on 06/27/2014 showed no HCC. Repeat imaging in May 2016.  - Transplant: Age >70, not a candidate      Thank you for this consult, we will continue to follow. Please page with questions.  DWA Dr. 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 48 of history from son, reports that she has been confused for the past 1-2 days at home, hasn't been taking her lactulose because she was more confused. Also with N/V and intermittent epigastric abdominal pain as well as non-bloody diarrhea at least 5 episodes daily. Furthermore, also with fever to 100.3, chills and sweats at home for the past 1-2 days. She denies any CP, SOB, cough,  dysuria. Work-up here showed GI pathogen panel with norovirus.  Still on lasix 20, aldactone 50. Lives with husband, who doesn't always regulate her very well and lets her go without taking her medications if she's not feeling like it per son.       See fellow history and physical for further details of the patient's history.    Objective    I have examined the patient and concur with the fellow exam.    Assessment and Plan    I agree with the fellow care plan.    See the fellow history and physical for further details.       ASSESSMENT / PLAN:  Saavi Mceachron is a 74 year old female F with HCV GT 2 cirrhosis (achieved SVR) complicated by ascites, HE and diuretic resistant HHT now s/p TIPS 06/28/2014 with hepatic venous pressure gradient decreasing from 22 to 7 mm Hg and TIPS revision 09/14/14 with gradient from 13 to 6 presents to the hospital with confusion. Confusion associated  with poor medication compliance, including lactulose, N/V, abd pain, diarrhea, and f/c/s. HDS. Work-up here showed GI pathogen panel with norovirus.     #)Norovirus: likely responsible for N/V, abd pain and diarrhea  - CT A/P w/out acute pathology  - Follow up blood cultures  - Supportive care with IVF and anti-emetics  - Would obtain orthostatic vital signs --> if orthostatic, can give 500cc of NS  - Would give 25% 25g albumin BID     #)HCV GT2 cirrhosis c/b ascites, HE and diuretic resistant HHT now s/p TIPS 06/28/2014 and TIPS revision 09/14/14  - HE: Better per son, likely related to infection and forgetting lactulose   - Continue lactulose and rifaximin  - Ascites: No ascites on recent U/S  - EV's: S/p TIPS, no longer needs surveillance if TIPS is patent and she is stable  - HHT: S/p TIPS and TIPS revision   - RUQ U/S 2/13 with patent TIPS   - Hold diuretics while actively with norovirus (lasix 20, aldactone 50)  - Non-occlusive portal vein thrombosis: Multiple outpatient notes indicate poor medication adherence/knowledge, also very  elderly, so no a/c  - [place] surveillance: Last CT on 06/27/2014 showed no HCC. Repeat imaging in May 2016.  - Transplant: Age >70, not a candidate

## 2014-09-25 NOTE — Progress Notes (Addendum)
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 Patient is stable currently with improved mentation but still have poor PO intake.    Interval Events/Subjective:   --Family at bedside.  --Patient says she feels more sleepy today and continues to have minimal abdominal tenderness. Also continues to have some mild nausea and not able to tolerate much food.     Vitals signs:     Latest Entry  Range (last 24 hours)    Temperature: 98 F (36.7 C)  Temp  Avg: 98.3 F (36.8 C)  Min: 98 F (36.7 C)  Max: 98.7 F (37.1 C)    Blood pressure (BP): 143/59 mmHg  BP  Min: 127/35  Max: 164/51    Heart Rate: 89  Pulse  Avg: 93.7  Min: 86  Max: 101    Respirations: 16  Resp  Avg: 16.1  Min: 14  Max: 18    SpO2: 97 %  SpO2  Avg: 99.1 %  Min: 97 %  Max: 100 %       No Data Recorded     Yesterday's intake and output:  02/14 0600 - 02/15 0559  In: 1506.3 [P.O.:300; I.V.:1206.3]  Out: -     Physical Exam  GENERAL:  Thin appearing woman with family at bedside. Sleepy.    HEENT: [place]. EOMI. Oropharynx appears moist. No LAD.   CARDIOVASCULAR:  Tachycardic and irregular rhythm. Mild flow murmur. No rubs/gallops/clicks.  LUNGS: Clear to auscultation bilaterally without wheezes, crackles, rhonchi.  ABDOMEN: Soft, non-distended. Normoactive bowel sounds. Tenderness with deep palpation throughout.   EXTREMITIES: Warm and well perfused.   INTEGUMENTARY: Excoriations on both forearms.   NEURO: CN II-XII grossly intact. Asterixis with 2 beats on the right side.     Labs (reviewed and notable for):    WBC 11.5 (10.2 yest), Hgb 11.6 (11.0 yest)  Alb 2.4, Tbili 3.56  Na 136, Cr 0.68    Lactate 2.0 (2/14 @ 0143) --> 4.3  (2/14 @ 1841) --> 4.0 (2/15 @ 0818)     Micro  GI pathogen panel 2/14 - pending  BCx 2/14 - pending    Radiology/Imaging/Procedures:   Abd U/S 2/13 -  Patent TIPS catheter with normal velocities and appropriate flow in portal veins.  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    A/P:  10F with HCV (GT2, SVR), cirrhosis (c/b ascites, HE, EV) 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 Patient is stable currently with improved mentation but still have poor PO intake.    #Elevated lactate: concerning for tissue hypoperfusion. Concerning for potential infectious cause. ?SBP. Bedside ultrasound to see if there is any ascites but low suspicion since patient has a patent TIPSS catheter.   --IV Ceftriaxone 2 g daily and IV flagyl while waiting for GI pathogen panel and worsening mental status  --CT abdomen/pelvis with and without contrast to evaluate for intra-abdominal pathology  --Monitor and fluid resuscitate    #  AMS: clear now. Thought to be HE in the setting of dehydration. Could be from infection. Will treat empirically (see above).   --Continue lactulose/rifaximin    #Nausea/vomiting: No clear cause at the moment.  --Phenergan as needed  --Follow up on GI pathogen panel    #HCV cirrhosis: status post TIPS (06/2014) with recent revision no 09/14/14. Abdominal ultrasound on admission shows patency of vessels.   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   --Ascites: none. Has TIPS. Bedside ultrasound to see if there is any pockets to tap.   --SBP: will cover empirically at this point given mild abdominal tenderness, leukocytosis, and elevated lactate. IV ceftriaxone 2 g daily.  --HCC: abdominal ultrasound without suspicious lesions. Although quad-phase CT scan would be better to visualize  lesions.    #DM2  --Continue to monitor and adjust as needed    #Osteoporosis  --Continue home calcium and vitamin D  --Holding bisphosphonates    #GERD:   --Continue pepcid 20 mg daily    DIET: Diet Custom: Carb Limited (4 Carbs)  VTE PPx: SQ heparin   CODE : Full Code  DISPO: Improvement of clinical course    Patient was seen, examined and discussed with attending, Dr. Ascencion Dike, who agrees with above plan.     Fausto Skillern, MD  PGY-1  Internal Medicine   [phone]    ATTENDING PROGRESS NOTE ATTESTATION    Subjective    Patient with increased diarrhea, persistent mild abdominal discomfort. Slighty more sleepy, confused.     Objective    I have examined the patient and concur with the resident exam.    Assessment and Plan    I agree with the resident care plan.    See the resident note for further details.        Noberto Retort, M.D.  Hospital Medicine Attending  PID [phone]

## 2014-09-25 NOTE — Plan of Care (Signed)
Problem: Infection  Goal: Absence of infection signs and symptoms  Pt with elevated WBC and trending up. Ceftriaxone IVPB started. Blood cultures and other labs done. Lactate trending trending up; MD aware. Temperature WNL up to this hour. Will continue to monitor.

## 2014-09-25 NOTE — Plan of Care (Signed)
Problem: Discharge Planning  Goal: Participation in care planning  Outcome: Met  Family supportive, at bedside all day this shift.  Pt positive for norovirus, family notified of status and contact precautions.    Problem: Falls, Risk of  Goal: Keep patient free from falls utilizing universal fall precautions  Outcome: Met  No falls sustained this shift, resting in bed all day, pt with some weakness but makes no attempts to get OOB.  Family at bedside for support.  Pt rounded on hourly, bed alarm on, calls appropriately for assistance.    Problem: Tissue Perfusion, Cardiopulmonary - Altered  Goal: Hemodynamic stability  Outcome: Met  Pt with HR in 90-100's, sinus arrhythmia, BP elevated at times, denies CP/SOB, resting quietly most of shift.    Problem: Pain - Acute  Goal: Communication of presence of pain  Outcome: Met  Pt denies pain this shift.    Problem: Alteration in Blood Glucose  Goal: Glucose level within specified parameters  Outcome: Not Met  Pt continues with elevated glucose levels despite poor intake.  Insulin coverage administered as ordered.    Problem: Infection  Goal: Absence of infection signs and symptoms  Outcome: Not Met  Pt positive for norovirus, continues with diarrhea and vomiting today.

## 2014-09-25 NOTE — Plan of Care (Signed)
Problem: Discharge Planning  Goal: Participation in care planning  Pt compliant with plan of care up to this hour. Will continue to monitor.     Problem: Falls, Risk of  Goal: Keep patient free from falls utilizing universal fall precautions  Pt educated to use the call light. Pt with call light within reach, bed alarm on, bed in low locked position, 2 side rails up, frequent rounding, family at bedside, personal belongings within reach. Will continue to monitor and round.  Goal: Ensure safe mobility of patient (Mobility)  Pt educated to use the call light. Pt with call light within reach, bed alarm on, bed in low locked position, 2 side rails up, frequent rounding, family at bedside, personal belongings within reach. Will continue to monitor and round.      Goal: Minimize the risk of falls due to the effects of prescribed medications (Medications)  Pt educated to use the call light before getting OOB. Pt with call light within reach, bed alarm on, bed in low locked position, 2 side rails up, frequent rounding, family at bedside, personal belongings within reach. Pt on lactulose with several incontinent stools. Will continue to monitor and round.        Problem: Tissue Perfusion, Cardiopulmonary - Altered  Goal: Hemodynamic stability  Blood pressure 131/42, pulse 86, temperature 98.2 F (36.8 C), resp. rate 18, SpO2 100 % on RA. EKG done and assessed by MD; MD aware of elevated qtc. Pt with NSR to ST with PACs. Pt denies SOB, chest pain, dizziness, or palpitations up to this hour. Will continue to monitor.         Problem: Pain - Acute  Goal: Communication of presence of pain  Pt educated on the numeric pain scale with no questions up to this hour. Will continue to monitor.     Problem: Alteration in Blood Glucose  Goal: Glucose level within specified parameters  Pt educated on BG goal and the parameters for insulin coverage. Will continue to monitor.

## 2014-09-25 NOTE — Plan of Care (Signed)
Pt down to CT scan

## 2014-09-26 DIAGNOSIS — R600 Localized edema: Secondary | ICD-10-CM

## 2014-09-26 DIAGNOSIS — K766 Portal hypertension: Secondary | ICD-10-CM

## 2014-09-26 DIAGNOSIS — I058 Other rheumatic mitral valve diseases: Secondary | ICD-10-CM

## 2014-09-26 DIAGNOSIS — I081 Rheumatic disorders of both mitral and tricuspid valves: Secondary | ICD-10-CM

## 2014-09-26 DIAGNOSIS — R9431 Abnormal electrocardiogram [ECG] [EKG]: Secondary | ICD-10-CM

## 2014-09-26 DIAGNOSIS — M79604 Pain in right leg: Secondary | ICD-10-CM

## 2014-09-26 DIAGNOSIS — I517 Cardiomegaly: Secondary | ICD-10-CM

## 2014-09-26 LAB — BASIC METABOLIC PANEL, BLOOD
ANION GAP: 13 mmol/L (ref 7–15)
BUN: 29 mg/dL — ABNORMAL HIGH (ref 6–20)
Bicarbonate: 21 mmol/L — ABNORMAL LOW (ref 22–29)
CALCIUM: 8.9 mg/dL (ref 8.5–10.6)
CHLORIDE: 105 mmol/L (ref 98–107)
Creatinine: 0.64 mg/dL (ref 0.51–0.95)
GFR: >60 mL/min
Glucose: 292 mg/dL — ABNORMAL HIGH (ref 70–115)
Potassium: 3.4 mmol/L — ABNORMAL LOW (ref 3.5–5.1)
Sodium: 139 mmol/L (ref 136–145)

## 2014-09-26 LAB — 2D ECHO WITH IMAGE ENHANCEMENT AGENT IF NECESSARY
CARDIAC INDEX: 7.1 L/min/m2 — ABNORMAL HIGH (ref 2.8–4.2)
Cardac Output: 10.6 L/min — ABNORMAL HIGH (ref 4.0–8.0)
LA VOLUME INDEX: 35 mL/m2 — ABNORMAL HIGH (ref 16–28)
LV EJECTION FRACTION: 72 % (ref >50)
PA PRESSURE: 34 mm Hg + CVP — ABNORMAL HIGH (ref 20–30)

## 2014-09-26 LAB — GLUCOSE (POCT)
GLUCOSE (POCT): 353 mg/dL — ABNORMAL HIGH (ref 70–115)
GLUCOSE (POCT): 389 mg/dL — ABNORMAL HIGH (ref 70–115)
GLUCOSE (POCT): 361 mg/dL — ABNORMAL HIGH (ref 70–115)
GLUCOSE (POCT): 298 mg/dL — ABNORMAL HIGH (ref 70–115)
Glucose (POCT): 317 mg/dL — ABNORMAL HIGH (ref 70–115)

## 2014-09-26 LAB — HEMOGRAM, BLOOD
HGB: 8.8 gm/dL — ABNORMAL LOW (ref 11.2–15.7)
Hct: 24.3 % — ABNORMAL LOW (ref 34.0–45.0)
MCH: 33.6 pg — ABNORMAL HIGH (ref 26.0–32.0)
MCHC: 36.2 % — ABNORMAL HIGH (ref 32.0–36.0)
MCV: 92.7 um3 (ref 79.0–95.0)
MPV: 10 fL (ref 9.4–12.4)
Plt Count: 53 10*3/uL — ABNORMAL LOW (ref 140–370)
RBC: 2.62 10*6/uL — ABNORMAL LOW (ref 3.90–5.20)
RDW: 19.3 % — ABNORMAL HIGH (ref 12.0–14.0)
WBC: 7.6 10*3/uL (ref 4.0–10.0)

## 2014-09-26 LAB — ECG 12-LEAD
ATRIAL RATE: 65 {beats}/min
QRS INTERVAL/DURATION: 70 ms
QT: 358 ms
QTC INTERVAL: 544 ms
R AXIS: 74 degrees
T AXIS: 77 degrees
VENTRICULAR RATE: 139 {beats}/min

## 2014-09-26 MED ORDER — METOCLOPRAMIDE HCL 5 MG/ML IJ SOLN
5.0000 mg | Freq: Three times a day (TID) | INTRAMUSCULAR | Status: DC
Start: 2014-09-26 — End: 2014-09-26

## 2014-09-26 MED ORDER — LACTULOSE 10 GM/15ML OR SOLN
20.00 g | Freq: Once | ORAL | Status: AC
Start: 2014-09-26 — End: 2014-09-26
  Administered 2014-09-26: 20 g via ORAL
  Filled 2014-09-26: qty 30

## 2014-09-26 MED ORDER — DILTIAZEM HCL 25 MG/5ML IV SOLN
10.00 mg | Freq: Once | INTRAVENOUS | Status: AC
Start: 2014-09-26 — End: 2014-09-26
  Administered 2014-09-26: 10 mg via INTRAVENOUS
  Filled 2014-09-26: qty 5

## 2014-09-26 MED ORDER — INSULIN GLARGINE 100 UNIT/ML SC SOLN
15.0000 [IU] | Freq: Every day | SUBCUTANEOUS | Status: DC
Start: 2014-09-27 — End: 2014-09-27
  Administered 2014-09-27: 15 [IU] via SUBCUTANEOUS
  Filled 2014-09-26: qty 15

## 2014-09-26 MED ORDER — SODIUM CHLORIDE 0.9 % IV SOLN
Freq: Once | INTRAVENOUS | Status: AC
Start: 2014-09-26 — End: 2014-09-26
  Administered 2014-09-26: 22:00:00 via INTRAVENOUS

## 2014-09-26 MED ORDER — INSULIN LISPRO (HUMAN) 100 UNIT/ML SC SOLN
4.00 [IU] | Freq: Once | SUBCUTANEOUS | Status: AC
Start: 2014-09-26 — End: 2014-09-26
  Administered 2014-09-26: 22:00:00 4 [IU] via SUBCUTANEOUS
  Filled 2014-09-26: qty 4

## 2014-09-26 MED ORDER — POTASSIUM CHLORIDE 20 MEQ/15ML (10%) OR SOLN
40.0000 meq | Freq: Once | ORAL | Status: DC
Start: 2014-09-27 — End: 2014-09-27
  Filled 2014-09-26: qty 30

## 2014-09-26 MED ORDER — MECLIZINE HCL 25 MG OR TABS
25.0000 mg | ORAL_TABLET | Freq: Three times a day (TID) | ORAL | Status: DC | PRN
Start: 2014-09-26 — End: 2014-10-12

## 2014-09-26 MED ORDER — INSULIN LISPRO (HUMAN) 100 UNIT/ML SC SOLN (CUSTOM)
1.0000 [IU] | Freq: Four times a day (QID) | INTRAMUSCULAR | Status: DC
Start: 2014-09-26 — End: 2014-10-01
  Administered 2014-09-26: 18:00:00 6 [IU] via SUBCUTANEOUS
  Administered 2014-09-26: 21:00:00 4 [IU] via SUBCUTANEOUS
  Administered 2014-09-27: 18:00:00 6 [IU] via SUBCUTANEOUS
  Administered 2014-09-27: 04:00:00 4 [IU] via SUBCUTANEOUS
  Administered 2014-09-27 – 2014-09-28 (×3): 6 [IU] via SUBCUTANEOUS
  Administered 2014-09-28: 2 [IU] via SUBCUTANEOUS
  Administered 2014-09-28 (×2): 6 [IU] via SUBCUTANEOUS
  Administered 2014-09-29 (×2): 3 [IU] via SUBCUTANEOUS
  Administered 2014-09-29 (×2): 2 [IU] via SUBCUTANEOUS
  Administered 2014-09-30: 22:00:00 4 [IU] via SUBCUTANEOUS
  Administered 2014-09-30: 09:00:00 5 [IU] via SUBCUTANEOUS
  Administered 2014-09-30 – 2014-10-01 (×3): 6 [IU] via SUBCUTANEOUS
  Filled 2014-09-26: qty 1
  Filled 2014-09-26: qty 4
  Filled 2014-09-26: qty 6
  Filled 2014-09-26: qty 1
  Filled 2014-09-26: qty 4
  Filled 2014-09-26: qty 6
  Filled 2014-09-26: qty 3
  Filled 2014-09-26: qty 2
  Filled 2014-09-26 (×2): qty 4
  Filled 2014-09-26: qty 6
  Filled 2014-09-26: qty 3
  Filled 2014-09-26 (×2): qty 6
  Filled 2014-09-26: qty 2
  Filled 2014-09-26: qty 3

## 2014-09-26 MED ORDER — INSULIN GLARGINE 100 UNIT/ML SC SOLN
5.00 [IU] | Freq: Once | SUBCUTANEOUS | Status: AC
Start: 2014-09-26 — End: 2014-09-26
  Administered 2014-09-26: 5 [IU] via SUBCUTANEOUS
  Filled 2014-09-26: qty 5

## 2014-09-26 MED ORDER — SODIUM CHLORIDE 0.9 % IV SOLN
INTRAVENOUS | Status: AC
Start: 2014-09-26 — End: 2014-09-26
  Administered 2014-09-26 (×2): via INTRAVENOUS

## 2014-09-26 MED ORDER — PROMETHAZINE HCL 25 MG OR TABS
25.0000 mg | ORAL_TABLET | Freq: Three times a day (TID) | ORAL | Status: DC
Start: 2014-09-26 — End: 2014-09-27
  Administered 2014-09-26 (×3): 25 mg via ORAL
  Filled 2014-09-26 (×4): qty 1

## 2014-09-26 NOTE — Plan of Care (Signed)
Patient down to Paula Manning.

## 2014-09-26 NOTE — Interdisciplinary (Signed)
Received patient transfer. VSS.Husband at bedside. Denies pain with slight nausea. Oriented to room and use of call light. Reinforce special contact precaution.

## 2014-09-26 NOTE — Interdisciplinary (Addendum)
Patient transferred to room 1113 via bed accompanied by relatives. Alert and oriented x1. No SOB noted. No complaints of pain at this time. IVF infusing well. Report given to [employer].

## 2014-09-26 NOTE — Plan of Care (Signed)
Problem: Discharge Planning  Goal: Participation in care planning  Outcome: Met  Pt family supportive with care and discharge planning.  Pt on contact precautions for norovirus, continues with IV hydration therapy and lactulose. CM and SW following.    Problem: Falls, Risk of  Goal: Keep patient free from falls utilizing universal fall precautions  Outcome: Met  No falls sustained this shift, pt with gen weakness, non-ambulatory at this time, makes no attempts to get OOB without assist, family remains with patient RTC.    Problem: Tissue Perfusion, Cardiopulmonary - Altered  Goal: Hemodynamic stability  Outcome: Met  Pt vss, continues in sinus arrhythmia on tele monitor, denies CP/SOB, med-surg level of care now, no further continuous cardiac monitoring needed.    Problem: Pain - Acute  Goal: Communication of presence of pain  Outcome: Met  Pt denies pain.    Problem: Alteration in Blood Glucose  Goal: Glucose level within specified parameters  Outcome: Not Met  Pt blood glucose remains elevated 200-300's.  Insulin administered per protocol.  Pt missed coverage dose midday as dose was not available then pt was not available as she went down to heart station for ECHO. Monitoring and insulin to resume prior to dinner dose.    Problem: Infection  Goal: Absence of infection signs and symptoms  Outcome: Not Met  Pt still on contact precautions for norovirus.      Problem: Skin Integrity- Intact patient high risk for impaired skin integrity Braden scale less than or equal to 18  Goal: Skin remains intact  Outcome: Met  Pt skin remains intact.

## 2014-09-26 NOTE — Interdisciplinary (Signed)
Attempted to speak to patient and patient not in room. Will attempt again later. CM to follow up as needed. Candace Cruise, LVN CM

## 2014-09-26 NOTE — Interdisciplinary (Signed)
EKG done. Patient given 500 cc bolus of NS. Lactulose x1.  Stat hemogram, chemistry and troponin drawn as ordered. Patient has ordered telemetry monitoring.

## 2014-09-26 NOTE — Interdisciplinary (Signed)
09/26/14 1555   Referral Information   Referral Type Discharge Planning   Social Assessment   Primary Decision Maker Surrogate  (husband, Paula Manning: [phone])   Advance Directive None   Interpreter Used? Yes (Comment Language)  (pt and husband are Spanish-speaking. Interpreter was offered and family politely declined, prefering to have granddaughter at bedside translate. )   Social Determinates of Health   Prior Living Situation [place] with family  [place]   [place];Adult Child/Children;Extended Family  (husband, daughter & 2 granddaughters that live 2 blocks away and assist as needed)   Income Nurse, learning disability Relies on Family/Friends  (husband drives)   Medication Compliance (recently missed 1 days of meds, which husband did not realize b/c pt was feeling ill  and resting x1-2d. SW encouraged fam to discuss necessity for meds. Husband acknowledges importance of meds and says he will be diligent in med compliance in the future)   Mental Health Assessment   Mental Status - Emotional Content No issues   Mental Status - Psychological Impaired Memory/Concentration   Adjustment to Illness   Family's Adjustment Acceptance   Substance Abuse History (CAGE-AID)   Substance Abuse History denies; CAGE deferred   Cognitive and Physical Functional Deficits   Prior Level of Function uses seated walker, needs assistance with all ADLs from husband. Husband says he has been pt's Environmental health practitioner, with 1hr/d. SW assessed pt and husband & approved more hours for patient to enable husband to undergo recommended surgery for leg, which will impact his functioning & ability to help pt at home.   Current Level of Function pending OOB activity when safe   Family Participation/Discussion of Post-Acute Care Needs husband and granddaughter at bedside, are supportive and helpful.   Discharge Plans/Interventions   Anticipated Discharge Destination Unable to determine at this time  (home v.  SNF if deconditioned, pending PT/OT evals)   Barriers to Discharge pending PT/OT and clinical improvement     SW orders for discharge planning.     Pt, Paula Manning, is a 74yo Spanish-speaking married Hispanic woman admitted on 09/15/2014 with AMS x2d, nausea, vomiting, diarrhea, with recent UTI. Pt has history of HCV, cirrhosis, DM, HN, osteoporosis. Pt is covered by Medicare A/B and Medi[employer]. She lives in [place] with her husband. Her supportive daughter and 2 granddaughters live nearby. At baseline pt uses a seated walker and her husband assists with all ADLs. Husband is her current Environmental health practitioner, receiving 1hr per day. Husband states that the IHSS SW assessed him and wife (husband is independent, drives, and ambulates with his own seated walker) to begin increased IHSS hours when husband undergoes surgery for his leg in the near future.     SW discussed importance of medication compliance and recommended calling PCP with new symptoms before going off meds. Husband conveys understanding importance of med compliance, but says he had not noticed pt didn't take her meds because she was not feeling well and was resting for 1-2 days. SW encouraged granddaughter to also check in with pt and husband, given recent decrease in functioning related to illness; she says she will do so.     Dispo pending PT/OT evals and clinical course. SW remains available to assist. If pt is cleared for return home, husband or family can provide transport there.     Nell Waymon Amato) Karlton Lemon, MSW  Clinical Social Worker  Pager [phone]

## 2014-09-26 NOTE — Interdisciplinary (Signed)
MD at bedside to see patient and relatives concerns/questions. Patient tachycardic at this time. HR= 146 bpm. Asymptomatic. No complaints of chest pain/pressure. Ordered stat EKG, lactulose x1. Blood sugar level checked. FS= 317 mg/dl. 4 units of Novolog given as ordered. IVF infusing well. Resting in her room.

## 2014-09-26 NOTE — Plan of Care (Signed)
Problem: Discharge Planning  Goal: Participation in care planning  Patient admitted with AMS, Pt lethargic and oriented to self and location (hospital) only. Family at bedside, supportive. No d/c plan at this time. Will continue to assess.    Problem: Falls, Risk of  Goal: Keep patient free from falls utilizing universal fall precautions  Universal fall precautions in place, non skid footwear. Bed in low position, wheels locked, bed alarm armed. Call light and personal items within reach. Pt not out of bed this shift. 2/3 siderails up, pt able to turn safely in bed. Room next to RN station, hourly rounding completed. Family at bedside. Pt incontinent, incontinence care promptly provided. Makes no attempt to get out of bed.     Problem: Tissue Perfusion, Cardiopulmonary - Altered  Goal: Hemodynamic stability  Pt monitored on tele with vitals Q4. Pt with Hx of afib, Pt sinus arrhythmia this shift.     BP 149/49 mmHg   Pulse 102   Temp(Src) 96.6 F (35.9 C)   Resp 15   SpO2 98%    Problem: Pain - Acute  Goal: Communication of presence of pain  Pt able to communicate pain verbally. Pt denies pain this shift. Pt lethargic, sleeping most of night. No pain medication administered (see MAR).    Problem: Alteration in Blood Glucose  Goal: Glucose level within specified parameters  BG monitored per order. BG elevated despite poor intake. Insulin coverage administered according to sliding scale (see MAR). Pt assessed for s/s hyper/hypoglycemia. Pt asymptomatic.     Problem: Infection  Goal: Absence of infection signs and symptoms  Pt afebrile this shift. WBC trending up. Pt (+) for norovirus, (-) for Cdiff. One episode of emesis.     CBC  Lab Results   Component Value Date     WBC 11.5* 09/25/2014     WBC 10.2* 09/24/2014     WBC 8.7 09/24/2014     WBC 7.0 09/18/2014       Problem: Skin Integrity- Intact patient high risk for impaired skin integrity Braden scale less than or equal to 18  Goal: Skin remains intact  Skin  assessed, remains intact. Checked frequently for episodes of incontinence, care provided promptly to ensure skin remains clean and dry. Pt with poor intake, elevated BG levels. Pt able to change position independently, encouraged to do so frequently.

## 2014-09-26 NOTE — Progress Notes (Signed)
Hepatology Progress Note  Consult Attending: Dr. Andreas Ohm, MD  Consult Fellow:  Gerrit Heck, MD  Current Attending: Dannette Barbara, MD  Date: 09/25/2014    24h events: no acute events overnight. Nausea, vomiting and abdominal pain improved today.     Past Medical History:  Past Medical History   Diagnosis Date    Migraine headache     Glaucoma     Ascites 06/29/2007     History of paracentesis x 2    Pancreatic cyst 06/29/2007    Cirrhosis ([place]) 06/24/2007     Active on the liver transplant waiting list with a blood type of O positive. 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 Due to Hep C    Osteopenia 06/24/2007    Emphysema 06/24/2007    EV (esophageal varices) ([place]) 06/10/2007    Anemia 06/10/2007    HCV (hepatitis C virus) 06/10/2007     Achieved SVR.    Type II or unspecified type diabetes mellitus with peripheral circulatory disorders, not stated as uncontrolled(250.70) 06/10/2007    HTN 06/10/2007    HTN 06/10/2007       Past Surgical History:   Past Surgical History   Procedure Laterality Date    Pb laps surg cholecstc w/expl common duct  1970s     in [place];  open CCX    Pb cesarean delivery only      Pb cstoplasty/cstourtp plstc any       Bladder suspension    Egd procedure  06/01/2013     Procedure: GI EGD;  Surgeon: Jeanette Caprice, MD;  Location: [facility];  Service: Gastroenterology;;    Tips procedure         Allergies:  No Known Allergies    Home Medications:  Prescriptions prior to admission   Medication Sig Dispense Refill Last Dose    alendronate (FOSAMAX) 70 MG tablet    09/22/2014    ALPHAGAN P 0.1 % ophthalmic solution    09/22/2014    bacitracin 500 UNIT/GM ointment Apply small amount to affected area on face twice daily 1 Tube 1 09/22/2014    CALCIUM CARBONATE 600 MG OR TABS 1 tablet 2 times daily one month 11 09/22/2014    ciprofloxacin (CIPRO) 500 MG tablet Take 1 tablet by mouth daily. 30 tablet 5 09/22/2014    furosemide (LASIX) 20 MG tablet Take  2 tablets by mouth daily. 90 tablet 0 09/22/2014    glipiZIDE (GLUCOTROL) 5 MG tablet Take 0.5 tablets (2.5 mg) by mouth 2 times daily. 30 tablet 1 09/22/2014    insulin glargine (LANTUS) 100 UNIT/ML injection Inject 20 Units under the skin daily. 10 mL 1 09/22/2014    lactulose 10 GM/15ML solution Take 30 mLs (20 g) by mouth 3 times daily. 946 mL 0 09/22/2014    LUMIGAN 0.01 %    09/22/2014    multivitamin (MULTI-VITAMIN) tablet Take 1 tablet by mouth daily.   09/22/2014    ranitidine (ZANTAC) 300 MG tablet Take 300 mg by mouth 2 times daily.   09/22/2014    rifaximin (XIFAXAN) 550 MG tablet Take 550 mg by mouth every 12 hours.   09/22/2014    sitagliptin (JANUVIA) 50 MG tablet Take 1 tablet (50 mg) by mouth daily. 60 tablet 1 09/22/2014    spironolactone (ALDACTONE) 50 MG tablet Take 1 tablet (50 mg) by mouth daily. 30 tablet 0 09/22/2014    VITAMIN D 400 UNIT OR CAPS 1 CAPSULE DAILY 30 11 09/22/2014  Inpatient Medications:  Scheduled Meds   albumin human  25 g BID    cholecalciferol  400 Units Daily    famotidine  20 mg Daily    heparin  5,000 Units Q12H    [START ON 09/27/2014] insulin glargine  15 Units Daily    insulin lispro  1-10 Units 4x Daily WC    insulin lispro  2 Units TID WC    lactulose  20 g TID    multivitamin  1 tablet Daily    promethazine  25 mg TID    rifaximin  550 mg Q12H    sodium chloride (PF)  3 mL Q8H     IV Meds   sodium chloride      sodium chloride 75 mL/hr at 09/26/14 4270     Social History:   Lives with husband. Has 10 children. Was a homemaker.  Denies alcohol, drugs, tobacco.     Review of Systems: -   Constitutional: Denies fevers, chills, weight loss  ENT: negative for eye changes, oral lesions, difficulty swallowing  Cardiac: Denies chest pain or palpitations  Lungs: Denies cough or shortness of breath  GI: Negative except noted in the HPI  GU: Denies dysuria, hematuria  Skin: Denies rashes or other skin lesions  MSK: Denies joint deformities, new joint  pain  Lymph: Denies enlarged nodes, night sweats, abnormal bleeding  Psych: negative for depressed mood, anxiety  Neuro: negative for confusion, weakness, sensory changes    OBJECTIVE:   Vitals signs:   Latest Entry  Range (last 24 hours)    Temperature: 98.1 F (36.7 C)  Temp  Avg: 97.7 F (36.5 C)  Min: 96.6 F (35.9 C)  Max: 98.3 F (36.8 C)    Blood pressure (BP): 155/56 mmHg  BP  Min: 126/87  Max: 176/53    Heart Rate: 105  Pulse  Avg: 105.6  Min: 100  Max: 111    Respirations: 15  Resp  Avg: 16.6  Min: 15  Max: 20    SpO2: 99 %  SpO2  Avg: 98.6 %  Min: 98 %  Max: 100 %       No Data Recorded     Weight - scale: 52.617 kg (116 lb)  Percentage Weight Change (%): 0 %    Intake/Output       09/25/14 0600 - 09/26/14 0559 09/26/14 0600 - 09/27/14 0559      [id number] 1800-0559 Total 0600-1759 1800-0559 Total       Intake    P.O.  600  150 750  60  -- 60    I.V.  880  100 980  316.3  -- 316.3    Total Intake 1480 250 1730 376.3 -- 376.3       Output    Total Output -- -- -- -- -- --       Net I/O     1480 250 1730 376.3 -- 376.3        Respiratory support:  Oxygen Therapy  SpO2: 99 %  O2 Device: None (Room air)  O2 Flow Rate (L/min): 2 l/min     Physical Exam:  General:  Well developed, thin elderly Hispanic female in no apparent distress, somnolent, a&o to person and place (never oriented to date per son)  HEENT:  No scleral icterus, Trachea midline, mucus membranes moist.  Lungs:  Clear to auscultation laterally. Normal respiratory effort.  Cardiovascular:  Regular rate and rhythm  Abdomen:  Abdomen soft, minimally tender  to palpation throughout entire abdomen, bowel sound present. No peritoneal signs.  Extremities:  Trace peripheral edema RLE > LLE. Warm well-perfused.  Skin:  Non-icteric and no visible rash  Neuro:  Cranial nerves II-XII grossly intact. Unable to keep arms up long enough to perform asterixis  Psych:  Normal affect.    Labs:     CBC  Recent Labs      09/24/14   2114  09/25/14   0818   WBC   10.2*  11.5*   HGB  11.0*  11.6   HCT  29.9*  31.7*   PLT  60*  69*   BAND  6   --    SEG  89*  88*   LYMPHS  2*  6*   MONOS  3*  5      Chemistry  Recent Labs      09/24/14   0733  09/25/14   0818   NA  133*  136   K  3.4*  4.2   CL  100  99   BICARB  20*  21*   BUN  13  22*   CREAT  0.68  0.68   GLU  257*  243*   [place]  7.8*  7.9*   MG  1.9  2.1   PHOS  2.1*  2.4*     Recent Labs      09/22/2014   2025  09/25/14   0818   ALK  91  98   AST  39*  32   ALT  27  25   TBILI  3.34*  3.56*   DBILI   --   1.3*   ALB  2.2*  2.4*        Coags  Recent Labs      09/25/14   0818   PT  18.7*   INR  1.7       Radiology:   CT A/P w/ contrast 09/25/14: FINDINGS:  LUNG BASES: Small to moderate right pleural effusion with right lower lobe   atelectasis  LIVER/BILIARY:Diffuse nodularity and hepatic atrophy. Stable scattered tiny   low attenuation lesions in the liver.  PANCREAS: Stable numerous pancreatic cysts.  SPLEEN: Unremarkable.  ADRENAL GLANDS: Unremarkable  KIDNEYS: Stable left renal cyst.  STOMACH/DUODENUM: Mild wall thickening from the distal stomach to the proximal  jejunum  VASCULATURE: Patent TIPS. Stable nonocclusive thrombus of the main portal vein  and superior mesenteric vein. New thrombosis of the left portal vein.  LYMPHATIC: Unremarkable  SMALL & LARGE BOWEL: Unremarkable  BLADDER/PELVIC ORGANS: Unremarkable  BONES/SOFT TISSUES: Unremarkable  OTHER: Trace pelvic ascites    IMPRESSION:  1) New thrombosis of the left portal vein. Vessel was patent on prior   abdominal ultrasound 09/30/2014  2) Edema of the distal stomach through proximal jejunum suggestive of   enteritis, nonspecific    CT head 10/01/2014: IMPRESSION:  1. No evidence of acute intracranial hemorrhage or findings to suggest   territorial/transcortical infarct.  2. Mild white matter changes. These may be associated with prior small vessel   ischemia. If there is concern for encephalopathy, MRI would be more helpful   for acute evaluation. MRI screening: No  intracranial or intraorbital metal.  3. Frothy secretions within the right sphenoid sinuses well as reactive bone   changes, suggestive of acute on chronic sinusitis.    RUQ U/S 10/01/2014: FINDINGS:  The liver measures 11.3 cm in long axis. It is normal in echogenicity. No  focal lesions are identified. There is no intra or extra hepatic bile duct   dilation. The common bile duct measures four mm. The gallbladder is not   visualized. A patent TIPS catheter is present. The TIPS velocities are: Portal  end 115 ; mid 93 ; hepatic 125 cm/s. Flow in the right and left portal veins   is hepatofugal and towards the TIPS ; flow in the main portal vein is   hepatopetal.  The right kidney measures nine cm in long axis and is within normal limits   with no hydronephrosis or renal calculi.  The visualized portion of the pancreas again demonstrates multiple cystic   lesions throughout the pancreas, as seen on prior imaging  The visualized aorta and inferior vena cava are within normal limits.  Right pleural effusion. No evidence of intra abdominal ascites.    IMPRESSION:  Patent TIPS catheter with normal velocities and appropriate flow in portal   veins.  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      Prior Endoscopy:   EGD 05/2013:  Findings: 1. Midline orophyarngeal prominence at the  arytenoid  2. Three columnns of grade 1 esophageal varices, no high  risk features  3. No gastric varices, mild portal hypertensive gastropathy  4. Few punctate clots in the antrum  5. Mild duodenal congestion consistent with portal  hypertension  Endoscopic Diagnosis: 1. Midline orophyarngeal  prominence at the arytenoid  2. Three columnns of grade 1 esophageal varices, no high  risk features  3. No gastric varices, mild portal hypertensive gastropathy  4. Few punctate clots in the antrum  5. Mild duodenal congestion consistent with  portal  hypertension  Recommendations: 1. Continue propranolol 10 BID  2. ENT consultation to evaluate oropharyngeal findings  3. 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 Confusion associated with poor medication compliance, including lactulose, N/V, abd pain, diarrhea, and f/c/s. HDS. Work-up here showed GI pathogen panel with norovirus.     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 and TIPS revision 09/14/14  - HE: Improving, likely related to infection and forgetting lactulose   - Continue lactulose and rifaximin  - Ascites: No ascites on recent U/S  - EV's: S/p TIPS, no longer needs surveillance if TIPS is patent and she is stable  - HHT: S/p TIPS and TIPS revision   - RUQ U/S 2/13 with patent TIPS   - Hold diuretics while actively with norovirus (lasix 20, aldactone 50)  - Non-occlusive portal vein thrombosis: Multiple outpatient notes indicate poor medication adherence/knowledge, also very elderly, so no a/c  - [place] surveillance: Last CT on 06/27/2014 showed no HCC. Repeat imaging in May 2016.  - Transplant: Age >70, not a candidate      DWA  Dr. Carollee Herter, MD  GI fellow

## 2014-09-26 NOTE — Interdisciplinary (Signed)
Initial Nutrition Assessment: PT assessed at moderate nutrition risk  A: 74 yo F with history of HCV, cirrhosis (c/b ascites, HE, EV) requiring TIPS, DM, HTN who presents with worsening AMS over the past 2 days. Patient was recently treated for UTI without complete improvement. She notes increased nausea vomiting the 2 days PTA with x1 episode of diarrhea (no black or bloody stools). She has some mild abdominal discomfort over known abdominal hernia but denies any acute worsening abdominal pain. She did not take her lactulose over the past 1 day because of her nausea and vomiting. Family noted her to have worsening mental status with increased confusion with decreased ability to ambulate. Pt undergoing treatment for norovirus (detected 2/14).     PMH: Migrane, glaucoma, ascites, paracentesis x2, pancreatic cyst, cirrhosis (on liver transplant waiting list), osetopenia, emphysema, EV, anemia, HCV, T2DM, HTN    Ht/Wt:  61"/52.7kg IBW/%IBW: 105#/110% BMI: 21.9 UBW: 116# per husband at bedside.   Per RD note 06/28/14 reported UBW was ~ 61.3kg a year from Nov. Per previous admits in 2014 wts ~54kg, 10/2013 ~57.2kg.     Diet Rx: Diet Custom: Carb Limited  PO intake/appetite: ~25-50% of meals recorded since admit. Per H&P, pt's PO intake was poor for the 4 days prior to admission.   Pt requested husband speak with the phone translator during interview. Pt intake has improved slightly over the last few meals, and sipping on Glucerna. Per husband pt has difficulty swallowing (possibly r/t hx of esophageal varices) and difficulty chewing. Pt has missing bottom teeth, her dentures were misplaced at her last hospital visit, and she has not obtained replacements. Per husband pt has been having NVD but has been improving. PTA pt was consuming two meals a day and a snack for dinner. Emphasized the importance of protein and PO for wt maintenance  I/Os: 1998mL/none recorded (+1978mL), net +3538mL since admit.   GI: LBM 2/15 x 4  (watery, brown). Stool x5 on 2/14. Abd: Soft, non-distended. Normoactive bowel sounds, tenderness with deep palpation throughout per MD note.  Distended, bs+ w/diarrhea per RN head to toe assessment.  Skin: RLE hyperpigmentation for muliple years per RN head to toe assessment, skin remains in tact per RN POC note.      Nutrition-Focused Physical Exam 2/16: (visual) pt expressed being tired of being examined today, explained didn't need to move and we didn't need to touch her  Subcutaneous Fat Loss: Orbital Region: Severe Muscle Loss: Temporalis: moderate, Quadricepts: moderate, Patellar Region: moderate Posterior Calf Region: mild  Labs: (2/15) BUN22, Glu 243, POCT 216-328mg /dL, DBili 1/3/T Bili 3.56 (ALT/AST WNL), Phos 2.4, Vit D <5 (06/2014), Vit A 0.22 (06/2014), ammonia 123 (2/13), lactate 4 (2/15), c.diff negative 2/14  Meds include: rifaximin, MVT daily, lactulose 20g TID, Lispro 2 units TID, lipsro SSI QID, Lantus 10units daily, cholecalciferol 400 units daily, phenergan, Compazine prn (2/15)    Estimated needs per [place](52.7kg): 975 x 1.4-1.6   1365-1560kcals (26-30kcals/52.7kg), 69-79g protein (1.3-1.5g protein/52.7kg) Maintenance Fluids 1.3-1.5L (25-72ml/kg) or per MD    D: Pt with moderate malnutrition r/t medical condition AEB severe fat loss/moderate muscle loss noted; poor documented oral intake <50% x 3 days recorded  I: GOAL(2/16): Pt to meet >75% of estimated needs with acceptable tolerance     *PLAN/RECS:  1. REC continue Carb Limited Diet. ADD 2gm Na restriction + snacks BID + Glucerna TID  2. REC Check vitamin D3 (25-OH) levels, increase cholecalciferol to 5000 units daily   3. REC Check Vitamin  A   4. REC adjust insulin or consult endo for goal BG <180  5. REC Weigh daily to monitor trends and assess dry weight  RD to monitor/evaluate: wt, labs, skin changes, PO adequacy/tolerance and f/u per policy  Above plan/recs relayed to care team and RN  Discharge plan: pending clinical  course  Education: encouraged protein intake, provided Spanish DM education per pt request.   Loma Sousa, Dietetic Intern

## 2014-09-26 NOTE — Discharge Summary (Shared)
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     Additional Hospital Diagnoses ("rule out" or "suspected" diagnoses, etc.):  DM2  Osteoporosis  GERD    Principal Procedure During This Hospitalization (required):  CT abd/pelvis 2/15  1) New thrombosis of the left portal vein. Vessel was patent on prior   abdominal ultrasound 09/30/2014  2) Edema of the distal stomach through proximal jejunum suggestive of   enteritis, nonspecific    Lower extremity ultrasound 2/16  No evidence of deep venous thrombosis.    Abdominal Ultrasound 2/16:  1. 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  2. Patent TIPS with normal hepatopetal flow and normal velocities.  3. Right pleural effusion.  4. Innumerable pancreatic cyst, consistent with CT finding from 09/25/2014 .    Other Procedures Performed During This Hospitalization (required):  None    Consultations Obtained During This Hospitalization:  Hepatology recommendations  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 and TIPS revision 09/14/14  - HE: Improving, likely related to  infection and forgetting lactulose  - Continue lactulose and rifaximin  - Ascites: No ascites on recent U/S  - EV's: S/p TIPS, no longer needs surveillance if TIPS is patent and she is stable  - HHT: S/p TIPS and TIPS revision  - RUQ U/S 2/13 with patent TIPS  - Hold diuretics while actively with norovirus (lasix 20, aldactone 50)  - Non-occlusive portal vein thrombosis: Multiple outpatient notes indicate poor medication adherence/knowledge, also very elderly, so no a/c  - HCC surveillance: Last CT on 06/27/2014 showed no HCC. Repeat imaging in May 2016.  - Transplant: Age >84, not a candidate    Reason for Admission to the Hospital / History of Present Illness:  Per H&P:    "Paula Manning is a 74 year old Spanish-speaking woman with pmh of HCV (GT2, achieved SVR), Cirrhosis (c/b ascites, HE, EV) s/p TIPS (06/2014) and recent TIPS revision (09/14/14), DM2, HTN, and recent UTI treated with 4 day course of abx by non-[facility] PCP who presents with 4 days of nausea, vomiting, and 1 day of altered mental status.    History was obtained via interpretation from her son. She was in her usual state of health until 4 days ago when she acutely developed nausea and vomiting. She in unsure of the cause and denies having any strange or undercooked foods, sick contacts, or recent travel. She denies abdominal pain at that time but now reports reflux irritation from the multiple bouts of vomiting. She denies  hematemesis, melena, and hematochezia. Her PO intake was poor for the 4 days prior to admission.    One day prior to admission, her family noticed that she appeared altered. She was unable to focus on the television, answer basic questions, and she was very unsteady on her feet requiring help from family to ambulate (not her usual). Notably, she has not taken her lactulose in the past 2 days because of one small episode of loose stool.    She denies chest pain, cough, headache, neck stiffness, dysuria, or hematuria.     Upon  presentation to the ED, she was hemodynamically stable, given a dose of Ceftriaxone for presumed UTI as well as lactulose.    When seen by the internal medicine team in the ED, she was alert and oriented."    Hospital Course by Problem (required):  #Diarrhea from norovirus: elevated lactate found on hospitalization from poor oral intake due to nausea/vomiting and also fluid loss from diarrhea. Patient had improved oral intake during her hospitalization once she received scheduled promethazine three times daily before meals.     #Nausea/vomiting: in the setting of norovirus. Improved with promethazine. Patient able to tolerate oral medications and solids on discharge.    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 Abdominal ultrasound during hospitalization showed probable occlusion of the left and right portal veins from thromboses. Followed by Hepatology as outpatient and has known thromboses that did not warrant anticoagulation. Inpatient Hepatology consulted and did not recommend starting anticoagulation. Thromboses were also not occlusive.   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   --Ascites: none. Has TIPS.   --SBP: no ascites.   --HCC: abdominal ultrasound without suspicious lesions. Although quad-phase CT scan would be better to visualize lesions.    #DM2: monitored and insulin adjusted as needed. ***    #Osteoporosis: continued home calcium and vitamin D. Patient advised to restart bisphosphonates on discharge.    #GERD: home pepcid 20 mg daily was continued.     Tests Outstanding at Discharge Requiring Follow Up:  {Outstanding Tests:13171}    Discharge Condition (required):  {Condition:12737}.    Key Physical Exam Findings at Discharge:  {Physical Exam Findings:13175}.    Discharge Diet:  {Select UPJS:31594}.    Discharge Medications:  {Fire This Link Only When ALL Discharge Medication Documentation Has Been  Completed:13193}    Allergies:  No Known Allergies    Discharge Disposition:  {Disposition:12745}.    Discharge Code Status:  {Code Status:13173}  {Code Status New/Unchanged:13179}    Follow Up Appointments:    Scheduled appointments:  {Fire This Link Only When Discharge Summary is Ready for Final Signature:13194}    For appointments requested for after discharge that have not yet been scheduled, refer to the Post Discharge Referrals section of the After Visit Summary.    Discharging 52 Contact Information:  [facility] operator at [phone].

## 2014-09-26 NOTE — Progress Notes (Addendum)
Inpatient Medicine Progress Note     Patient Name: Paula Manning  MRN: [id number]    [facility] [facility] Stay:   3 days - Admitted on: 09/29/2014      ID: Mrs. 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 Patient is stable currently with improved mentation but still have poor PO intake.    Interval Events/Subjective:   --Patient has less nausea with scheduled promethazine. Able to eat breakfast without any issues.    Vitals signs:     Latest Entry  Range (last 24 hours)    Temperature: 98.1 F (36.7 C)  Temp  Avg: 97.7 F (36.5 C)  Min: 96.6 F (35.9 C)  Max: 98.3 F (36.8 C)    Blood pressure (BP): 155/56 mmHg  BP  Min: 126/87  Max: 176/53    Heart Rate: 105  Pulse  Avg: 105.6  Min: 100  Max: 111    Respirations: 15  Resp  Avg: 16.6  Min: 15  Max: 20    SpO2: 99 %  SpO2  Avg: 98.6 %  Min: 98 %  Max: 100 %       No Data Recorded     Yesterday's intake and output:  02/15 0600 - 02/16 0559  In: 4259 [P.O.:750; I.V.:980]  Out: -     Physical Exam  GENERAL:  Thin appearing woman with family at bedside. Pleasant and cooperative.     HEENT: EOMI. Oropharynx appears moist. No LAD.   CARDIOVASCULAR:  Tachycardic and irregular rhythm. Mild flow murmur. No rubs/gallops/clicks.  LUNGS: CTAB  ABDOMEN: Soft, non-distended. Normoactive bowel sounds. Non-tender  EXTREMITIES: Warm and well perfused.   NEURO: CN II-XII grossly intact. No asteris.    Labs (reviewed and notable for):    No new labs at the time of this note.     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 Patient is stable  currently with improved mentation but still have poor PO intake. GI pathogen positive for norovirus.    #Diarrhea from norovirus: elevated lactate most likely from hypoperfusion from poor oral intake due to nausea/vomiting and also fluid loss from diarrhea. Has better oral intake now with scheduled promethazine three times daily before meals.   --GI pathogen positive for norovirus; hydration, encourage oral intake, and also contact precautions    #HCV cirrhosis: status post TIPS (06/2014) with recent revision no 09/14/14. Abdominal ultrasound on admission shows patency of vessels.   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   --Ascites: none. Has TIPS.   --SBP: no ascites.   --HCC: abdominal ultrasound without suspicious lesions. Although quad-phase CT scan would be better to visualize lesions.    #DM2  --Continue to monitor and adjust as needed    #Osteoporosis  --Continue home calcium and vitamin D  --Holding bisphosphonates    #GERD:   --Continue pepcid 20 mg daily    DIET: Diet Custom: Carb Limited (4 Carbs)  VTE PPx: SQ heparin   CODE : Full  Code  DISPO: Can be discharged tomorrow if continues to improve.     Patient was seen, examined and discussed with attending, Dr. Ascencion Dike, who agrees with above plan.     Fausto Skillern, MD  PGY-1  Internal Medicine     ATTENDING PROGRESS NOTE ATTESTATION    Subjective    Patient without acute complaints. Improved abdominal discomfort, less nausea/vomiting.     Objective    I have examined the patient and concur with the resident exam.    Assessment and Plan    I agree with the resident care plan.    See the resident note for further details.    74 yo F with history of HCV with cirrhosis post TIPS, admitted with worsening nausea, vomiting found to have new norovirus.     Appreciate Hepatology support  Will continue supportive care, contact precautions, anti-emetics  Anticipate discharge tomorrow if remains clinically  stable  Can likely Downgrade to med/surg    Noberto Retort, M.D.  Hospital Medicine Attending  PID [phone]

## 2014-09-27 LAB — BASIC METABOLIC PANEL, BLOOD
Anion Gap: 13 mmol/L (ref 7–15)
BUN: 30 mg/dL — ABNORMAL HIGH (ref 6–20)
Bicarbonate: 18 mmol/L — ABNORMAL LOW (ref 22–29)
Calcium: 8.8 mg/dL (ref 8.5–10.6)
Chloride: 105 mmol/L (ref 98–107)
Creatinine: 0.65 mg/dL (ref 0.51–0.95)
GFR: >60 mL/min
Glucose: 377 mg/dL — ABNORMAL HIGH (ref 70–115)
Potassium: 4.2 mmol/L (ref 3.5–5.1)
Sodium: 136 mmol/L (ref 136–145)

## 2014-09-27 LAB — CKMB+INDEX (NO CPK), BLOOD
CK-MB Index: 1.5 %
CK-MB: 2 ng/mL (ref 0.0–2.8)
CK-MB: 3 ng/mL — ABNORMAL HIGH (ref 0.0–2.8)

## 2014-09-27 LAB — CBC WITH DIFF, BLOOD
ANC-Manual Mode: 7.1 10*3/uL — ABNORMAL HIGH (ref 1.6–7.0)
Abs Eosinophils: 0.1 10*3/uL (ref 0.1–0.7)
Abs Lymphs: 0.7 10*3/uL — ABNORMAL LOW (ref 0.8–3.1)
Abs Monos: 0.2 10*3/uL (ref 0.2–0.8)
Bands: 3 % (ref 0–15)
Eosinophils: 1 % (ref 1–4)
Hct: 27.3 % — ABNORMAL LOW (ref 34.0–45.0)
Hgb: 9.2 gm/dL — ABNORMAL LOW (ref 11.2–15.7)
Lymphocytes: 9 % — ABNORMAL LOW (ref 19–53)
MCH: 33.1 pg — ABNORMAL HIGH (ref 26.0–32.0)
MCHC: 33.7 % (ref 32.0–36.0)
MCV: 98.2 um3 — ABNORMAL HIGH (ref 79.0–95.0)
MPV: 10 fL (ref 9.4–12.4)
Monocytes: 3 % — ABNORMAL LOW (ref 5–12)
Number of Cells Counted: 100
Plt Count: 67 10*3/uL — ABNORMAL LOW (ref 140–370)
Plt Est: DECREASED
RBC: 2.78 10*6/uL — ABNORMAL LOW (ref 3.90–5.20)
RDW: 20.4 % — ABNORMAL HIGH (ref 12.0–14.0)
Segs: 84 % — ABNORMAL HIGH (ref 34–71)
WBC: 8.2 10*3/uL (ref 4.0–10.0)

## 2014-09-27 LAB — GLUCOSE (POCT)
GLUCOSE (POCT): 382 mg/dL — ABNORMAL HIGH (ref 70–115)
GLUCOSE (POCT): 467 mg/dL — ABNORMAL HIGH (ref 70–115)
GLUCOSE (POCT): 462 mg/dL — ABNORMAL HIGH (ref 70–115)
GLUCOSE (POCT): 382 mg/dL — ABNORMAL HIGH (ref 70–115)

## 2014-09-27 LAB — CPK-CREATINE PHOSPHOKINASE, BLOOD
CPK: 54 U/L (ref 0–175)
CPK: 200 U/L — ABNORMAL HIGH (ref 0–175)

## 2014-09-27 LAB — PHOSPHORUS, BLOOD: Phosphorous: 2 mg/dL — ABNORMAL LOW (ref 2.7–4.5)

## 2014-09-27 LAB — TROPONIN T, BLOOD
TROPONIN T: 0.03 ng/mL (ref <0.01)
TROPONIN T: <0.01 ng/mL (ref <0.01)

## 2014-09-27 LAB — APTT, BLOOD: PTT: 43.5 s — ABNORMAL HIGH (ref 25.0–34.0)

## 2014-09-27 LAB — MAGNESIUM, BLOOD: Magnesium: 2.5 mg/dL (ref 1.7–2.6)

## 2014-09-27 MED ORDER — SODIUM CHLORIDE 0.9 % IV SOLN
0.0000 mg/h | INTRAVENOUS | Status: DC
Start: 2014-09-27 — End: 2014-09-27
  Administered 2014-09-27: 12 mg/h via INTRAVENOUS
  Administered 2014-09-27: 5 mg/h via INTRAVENOUS
  Filled 2014-09-27: qty 25

## 2014-09-27 MED ORDER — PROMETHAZINE HCL 25 MG/ML IJ SOLN
12.5000 mg | Freq: Three times a day (TID) | INTRAMUSCULAR | Status: DC
Start: 2014-09-27 — End: 2014-09-29
  Administered 2014-09-27 (×3): 12.5 mg via INTRAMUSCULAR
  Filled 2014-09-27 (×5): qty 1

## 2014-09-27 MED ORDER — DILTIAZEM HCL 25 MG/5ML IV SOLN
10.00 mg | Freq: Once | INTRAVENOUS | Status: AC
Start: 2014-09-27 — End: 2014-09-27
  Administered 2014-09-27: 10 mg via INTRAVENOUS
  Filled 2014-09-27: qty 5

## 2014-09-27 MED ORDER — SODIUM CHLORIDE 0.9 % IV SOLN
0.00 mg/h | INTRAVENOUS | Status: AC
Start: 2014-09-27 — End: 2014-09-27
  Administered 2014-09-27: 14 mg/h via INTRAVENOUS
  Filled 2014-09-27: qty 25

## 2014-09-27 MED ORDER — RAMELTEON 8 MG OR TABS
8.00 mg | ORAL_TABLET | Freq: Every evening | ORAL | Status: AC
Start: 2014-09-27 — End: 2014-09-27
  Administered 2014-09-27: 8 mg via ORAL
  Filled 2014-09-27: qty 1

## 2014-09-27 MED ORDER — INSULIN LISPRO (HUMAN) 100 UNIT/ML SC SOLN (CUSTOM)
4.0000 [IU] | Freq: Three times a day (TID) | INTRAMUSCULAR | Status: DC
Start: 2014-09-27 — End: 2014-09-28
  Administered 2014-09-27 (×2): 4 [IU] via SUBCUTANEOUS
  Filled 2014-09-27 (×2): qty 4

## 2014-09-27 MED ORDER — DILTIAZEM HCL 30 MG OR TABS
30.0000 mg | ORAL_TABLET | Freq: Four times a day (QID) | ORAL | Status: DC
Start: 2014-09-27 — End: 2014-09-28
  Administered 2014-09-27 – 2014-09-28 (×4): 30 mg via ORAL
  Filled 2014-09-27 (×4): qty 1

## 2014-09-27 MED ORDER — LACTULOSE 10 GM/15ML OR SOLN
20.00 g | Freq: Once | ORAL | Status: AC
Start: 2014-09-27 — End: 2014-09-27
  Administered 2014-09-27: 20 g via ORAL
  Filled 2014-09-27: qty 30

## 2014-09-27 MED ORDER — HEPARIN SODIUM (PORCINE) 1000 UNIT/ML IJ SOLN (CUSTOM)
60.0000 [IU]/kg | Freq: Once | INTRAMUSCULAR | Status: DC
Start: 2014-09-27 — End: 2014-09-27

## 2014-09-27 MED ORDER — POTASSIUM CHLORIDE CRYS CR 10 MEQ OR TBCR
40.00 meq | EXTENDED_RELEASE_TABLET | Freq: Once | ORAL | Status: AC
Start: 2014-09-27 — End: 2014-09-27
  Administered 2014-09-27: 40 meq via ORAL
  Filled 2014-09-27: qty 4

## 2014-09-27 MED ORDER — ENOXAPARIN SODIUM 60 MG/0.6ML SC SOLN
1.0000 mg/kg | Freq: Two times a day (BID) | SUBCUTANEOUS | Status: DC
Start: 2014-09-27 — End: 2014-09-28
  Administered 2014-09-27 – 2014-09-28 (×3): 50 mg via SUBCUTANEOUS
  Filled 2014-09-27 (×3): qty 0.6

## 2014-09-27 MED ORDER — INSULIN GLARGINE 100 UNIT/ML SC SOLN
5.00 [IU] | Freq: Once | SUBCUTANEOUS | Status: AC
Start: 2014-09-27 — End: 2014-09-27
  Administered 2014-09-27: 5 [IU] via SUBCUTANEOUS
  Filled 2014-09-27: qty 5

## 2014-09-27 MED ORDER — DEXTROSE-NACL 5-0.45 % IV SOLN (CUSTOM)
INTRAVENOUS | Status: DC
Start: 2014-09-27 — End: 2014-09-27
  Administered 2014-09-27: 02:00:00 via INTRAVENOUS

## 2014-09-27 MED ORDER — DILTIAZEM HCL 30 MG OR TABS
30.0000 mg | ORAL_TABLET | Freq: Three times a day (TID) | ORAL | Status: DC
Start: 2014-09-27 — End: 2014-09-27

## 2014-09-27 MED ORDER — INSULIN GLARGINE 100 UNIT/ML SC SOLN
20.0000 [IU] | Freq: Every day | SUBCUTANEOUS | Status: DC
Start: 2014-09-28 — End: 2014-09-28

## 2014-09-27 MED ORDER — HEPARIN (PORCINE) IN NACL 50-0.45 UNIT/ML-% IJ SOLN
INTRAMUSCULAR | Status: DC
Start: 2014-09-27 — End: 2014-09-27

## 2014-09-27 NOTE — Plan of Care (Signed)
Problem: Alteration in Blood Glucose  Goal: Glucose level within specified parameters  Outcome: Not Met  Text paged MD 1st call at [phone] made aware that FSG 382 mg/dL, pt given 4 units of lispro per sliding scale. No symptoms of hyperglycemia noted.

## 2014-09-27 NOTE — Plan of Care (Signed)
Problem: Skin Integrity- Intact patient high risk for impaired skin integrity Braden scale less than or equal to 18  Goal: Skin remains intact  Outcome: Met  Pt has been turned q 2 hrs while in bed and pillow on back to relieve pressure. Pt has been oob twice today and up in chair. No pressure sores noted. Pt has been cleansed promptly after loose bm incontinence, protective ointment applied on buttocks and perineum. Currently in bed, heels and elbows are offloaded on pillows.

## 2014-09-27 NOTE — Plan of Care (Signed)
Problem: Tissue Perfusion, Cardiopulmonary - Altered  Goal: Hemodynamic stability  Outcome: Met  Pt is still on afib in 90-100s. Pt was started on po diltiazem. Diltiazem drip was dc'd. Normotensive.

## 2014-09-27 NOTE — Plan of Care (Signed)
Problem: Alteration in Blood Glucose  Goal: Glucose level within specified parameters  Outcome: Not Met  Dr Alfonse Spruce has been paged with high FSG's (382 and 467). MD increased the nutritional lispro from 2 units to 4 units every meal. Lantus was increased was 15 to 20 units QD. Maintenance ivf of D51/2ns w/ 20 meQ Kcl at 75 ml/hr was dc'd. Pt is on carb-limited diet. Will continue to check FSG AC/HS.

## 2014-09-27 NOTE — Plan of Care (Signed)
Problem: Discharge Planning  Goal: Participation in care planning  Outcome: Not Met  Discussed plan of care, interdisciplinary coordination done.    Problem: Falls, Risk of  Goal: Keep patient free from falls utilizing universal fall precautions  Outcome: Met  Patient's bed at lowest setting with side rails upx2. Patient's bed brakes on and bed alarm on. Call light and bedside table within reach. Patient encouraged to call for assistance when needed. Patient compliant and demonstrates understanding.family at bedside at all time. Gait steady with 1 person  assist.         Problem: Tissue Perfusion, Cardiopulmonary - Altered  Goal: Hemodynamic stability  Outcome: Not Met  afib 120-140's, cardizem drip started, SBP 120-130;s, denies any chest pain, will continue to monitor.    Problem: Pain - Acute  Goal: Communication of presence of pain  Outcome: Met  Pt educated and instructed regarding pain management control. Pt instructed to report to nurse increased levels of pain. Pt verbalized understanding to instructions and teachings. No c/o pain noted at this time. Will medicate pt for pain prn per MD order. Will monitor pain levels        Problem: Alteration in Blood Glucose  Goal: Glucose level within specified parameters  Outcome: Met  Monitored closely for any signs/symptoms of hypoglycemia and hyperglycemia .    Problem: Infection  Goal: Absence of infection signs and symptoms  Outcome: Not Met  Afebrile at this time,  Assessed and monitored for signs of infection, monitored for any  redness, warmth, discharge, pain, and increased body temperature.   Review lab values (e.g., white blood cell count and differential, serum protein, serum albumin, and cultures)          Problem: Skin Integrity- Intact patient high risk for impaired skin integrity Braden scale less than or equal to 18  Goal: Skin remains intact  Outcome: Met  Repositioned every 2 hrs to prevent pressure related skin issue,  kept clean and dry, skin  barrier ointment applied. Provided adequate nutrition, will continue to monitor.

## 2014-09-27 NOTE — Plan of Care (Signed)
Problem: Falls, Risk of  Goal: Keep patient free from falls utilizing universal fall precautions  Outcome: Met  Pt has been oob to chair with 2-person assistance. Non skid socks on. Currently in bed. No falls occurred. Will continue frequent rounds.

## 2014-09-27 NOTE — Interdisciplinary (Signed)
Paged MD about patient's family concern that pt has not sleep for 48 hrs now and request for sleep aid and to help patient relax. Awaiting for MD response.

## 2014-09-27 NOTE — Interdisciplinary (Signed)
Report given to RN Sharyn Lull discussed POC, afib 100's, on cardizem drip, confused, family at bedside.

## 2014-09-27 NOTE — Progress Notes (Addendum)
Inpatient Medicine Progress Note     Patient Name: Paula Manning  MRN: [id number]    [facility] Stay:   4 days - Admitted on: 09/27/2014      ID: Mrs. 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 Patient is stable currently with improved mentation but still have poor PO intake. Hospital course complicated by afib with RVR.     Interval Events/Subjective:   --Patient went into A-fib with RVR overnight and was given dilt x2 that did not resolve her a-fib, so she was started on a dilt drip  --Family also reports that she has not slept in the past 24 hours and is starting to become more confused and seeing visions    Vitals signs:     Latest Entry  Range (last 24 hours)    Temperature: 97.7 F (36.5 C)  Temp  Avg: 97.9 F (36.6 C)  Min: 97.4 F (36.3 C)  Max: 98.4 F (36.9 C)    Blood pressure (BP): 127/55 mmHg  BP  Min: 100/76  Max: 154/62    Heart Rate: 98  Pulse  Avg: 116.8  Min: 95  Max: 150    Respirations: 21  Resp  Avg: 18.9  Min: 14  Max: 25    SpO2: 99 %  SpO2  Avg: 98.5 %  Min: 97 %  Max: 100 %       No Data Recorded     Yesterday's intake and output:  02/16 0600 - 02/17 0559  In: 2508.9 [P.O.:714; I.V.:1794.9]  Out: 200 [Urine:200]    Physical Exam  GENERAL:  Thin appearing woman with family at bedside. Pleasant and cooperative.     HEENT: EOMI. Oropharynx appears moist. No LAD.   CARDIOVASCULAR:  Tachycardic and irregular rhythm. Mild flow murmur. No rubs/gallops/clicks.  LUNGS: CTAB  ABDOMEN: Soft, non-distended. Normoactive bowel sounds. Non-tender  EXTREMITIES: Warm and well perfused.   NEURO: CN II-XII grossly intact. No asteris.    Labs (reviewed and notable for):    WBC/HGB/HCT/PLT:  8.2/9.2/27.3/67 (02/17 0605)  BMP:  136/4.2/105/18/30/0.65/377 (02/17 0605)    Lab Results   Component Value Date    CPK 54 09/27/2014    [place] 2.0 09/27/2014    [place] 0.03 09/27/2014     Micro  GI pathogen panel 2/14 - norovirus   BCx 2/14 - NGTD x24    Radiology/Imaging/Procedures:   EKG: afib with RVR. Non-specific ST-T changes in V5.     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 Patient is stable currently with improved mentation but still have poor PO intake. GI pathogen positive for norovirus.    #Afib w/ RVR: most likely multifactorial from dehydration + diarrhea. Rate-controlled on dilt drip. Will transition to oral dilt. CHADSVASC of 4. HAS-BLED 2. Patient needs anticoagulation given high risk of stroke due to her risk factors (age, gender, DM, and history of HTN).  --Oral diltiazem 30 mg q6 hrs and titrate as needed  --Lovenox 1 mg/kg twice daily for now and would like pharmacy to see if patient would qualify for a NOAC given that she has underlying liver disease that would make it difficult  to trend her INR    #Diarrhea from norovirus: elevated lactate most likely from hypoperfusion from poor oral intake due to nausea/vomiting and also fluid loss from diarrhea. Has better oral intake now with scheduled promethazine three times daily before meals.    --Hydration, encourage oral intake, and also contact precautions    #HCV cirrhosis: status post TIPS (06/2014) with recent revision no 09/14/14. Abdominal ultrasound on admission shows patency of vessels.   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   --Ascites: none. Has TIPS.   --SBP: no ascites.   --HCC: abdominal ultrasound without suspicious lesions. Although quad-phase CT scan would be better to visualize lesions.    #DM2  --Continue to monitor and adjust as needed    #Osteoporosis  --Continue home calcium and vitamin D  --Holding bisphosphonates    #GERD:   --Continue pepcid 20  mg daily    DIET: Diet Custom: Carb Limited (4 Carbs)  Nutritional Supplement Glucerna  VTE PPx: SQ heparin   CODE : Full Code  DISPO: Pending clinical improvement    Patient was seen, examined and discussed with attending, Dr. Ascencion Dike, who agrees with above plan.     Fausto Skillern, MD  PGY-1  Internal Medicine     ATTENDING PROGRESS NOTE ATTESTATION    Subjective    Patient with mild worsening confusion, but seems more alert. Denies any abdominal pain. Noted to be in atrial fibrillation with RVR.     Objective    I have examined the patient and concur with the resident exam.    Assessment and Plan    I agree with the resident care plan.    See the resident note for further details.    74 yo F with of HCV with cirrhosis post TIPS, admitted with worsening nausea, vomiting found to have new norovirus, now with new atrial fibrillation with RVR. 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      Appreciate Hepatology support, noted occlusive portal vein, initially discussed with hepatology and no anticoagulation recommended but given new afib w/ RVR and CHADSVasc of 4 would consider anticoagulation with coumadin vs NOAC, awaiting further decision from patient's family  Will continue supportive care, contact precautions, anti-emetics for norovirus      Noberto Retort, M.D.  Hospital Medicine Attending  PID [phone]

## 2014-09-27 NOTE — Interdisciplinary (Signed)
09/27/14 0656   Patient Information   Why is Patient in the Hospital? AMS    Prior to Level of Function Use Assisted Devices   [place]   Referral To   Financial Resources Medicare  (A&B)   Discharge Planning   Living Arrangements Spouse / significant other;Family members  (Lives woth husband Paula Manning and son Paula Manning)   Type of Residence Private residence   [place] No   Patient expects to be discharged to: TBD   Do you have transportation home?  Yes  (Husband Paula Manning)   Social Worker Consult   Do you need to see a Education officer, museum?  No   73 year old Hispanic female admitted for AMS. I spoke to patient at bedside in preferred language, Spanish. Patient stated that she was independent with ADLs and uses a cane. Patient lives with her husband Paula Manning and son Paula Manning in a [facility]. PCP is Paula Manning. Patient is agreeable to go to a SNF if recommended by medical team and PT. Patient's husband will transport patient home when medically stable. CM to follow up as needed. Paula Manning, LVN CM

## 2014-09-27 NOTE — Plan of Care (Addendum)
Problem: Alteration in Blood Glucose  Goal: Glucose level within specified parameters  Outcome: Not Met  Dr Alfonse Spruce was text paged:  "fingerstick blood sugar now is 467. Do u want to continue the d51/2ns w/ 55mQ Kcl at 75 ml/hr?" Awaiting md to call back.    Addendum: made changes to insulin order for patient to get additional 5 units now and to discontinue fluids (see above).     NFausto Skillern MD  PGY-1  Internal Medicine

## 2014-09-27 NOTE — Progress Notes (Signed)
Hepatology Progress Note  Consult Attending: Dr. Andreas Ohm, MD  Consult Fellow:  Gerrit Heck, MD  Current Attending: Dannette Barbara, MD  Date: 09/27/2014    24h events: pt found with afib w rvr overnight, started on dilt drip. Patient still encephalopathic. Echo performed.    Past Medical History:  Past Medical History   Diagnosis Date    Migraine headache     Glaucoma     Ascites 06/29/2007     History of paracentesis x 2    Pancreatic cyst 06/29/2007    Cirrhosis ([name]) 06/24/2007     Active on the liver transplant waiting list with a blood type of O positive. 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 Due to Hep C    Osteopenia 06/24/2007    Emphysema 06/24/2007    EV (esophageal varices) ([place]) 06/10/2007    Anemia 06/10/2007    HCV (hepatitis C virus) 06/10/2007     Achieved SVR.    Type II or unspecified type diabetes mellitus with peripheral circulatory disorders, not stated as uncontrolled(250.70) 06/10/2007    HTN 06/10/2007    HTN 06/10/2007       Past Surgical History:   Past Surgical History   Procedure Laterality Date    Pb laps surg cholecstc w/expl common duct  1970s     in [place];  open CCX    Pb cesarean delivery only      Pb cstoplasty/cstourtp plstc any       Bladder suspension    Egd procedure  06/01/2013     Procedure: GI EGD;  Surgeon: Jeanette Caprice, MD;  Location: [facility];  Service: Gastroenterology;;    Tips procedure         Allergies:  No Known Allergies    Home Medications:  Prescriptions prior to admission   Medication Sig Dispense Refill Last Dose    alendronate (FOSAMAX) 70 MG tablet    09/22/2014    ALPHAGAN P 0.1 % ophthalmic solution    09/22/2014    bacitracin 500 UNIT/GM ointment Apply small amount to affected area on face twice daily 1 Tube 1 09/22/2014    CALCIUM CARBONATE 600 MG OR TABS 1 tablet 2 times daily one month 11 09/22/2014    ciprofloxacin (CIPRO) 500 MG tablet Take 1 tablet by mouth daily. 30 tablet 5 09/22/2014    furosemide  (LASIX) 20 MG tablet Take 2 tablets by mouth daily. 90 tablet 0 09/22/2014    glipiZIDE (GLUCOTROL) 5 MG tablet Take 0.5 tablets (2.5 mg) by mouth 2 times daily. 30 tablet 1 09/22/2014    insulin glargine (LANTUS) 100 UNIT/ML injection Inject 20 Units under the skin daily. 10 mL 1 09/22/2014    lactulose 10 GM/15ML solution Take 30 mLs (20 g) by mouth 3 times daily. 946 mL 0 09/22/2014    LUMIGAN 0.01 %    09/22/2014    multivitamin (MULTI-VITAMIN) tablet Take 1 tablet by mouth daily.   09/22/2014    ranitidine (ZANTAC) 300 MG tablet Take 300 mg by mouth 2 times daily.   09/22/2014    rifaximin (XIFAXAN) 550 MG tablet Take 550 mg by mouth every 12 hours.   09/22/2014    sitagliptin (JANUVIA) 50 MG tablet Take 1 tablet (50 mg) by mouth daily. 60 tablet 1 09/22/2014    spironolactone (ALDACTONE) 50 MG tablet Take 1 tablet (50 mg) by mouth daily. 30 tablet 0 09/22/2014    VITAMIN D 400 UNIT OR CAPS 1 CAPSULE DAILY 30 11  09/22/2014       Inpatient Medications:  Scheduled Meds   albumin human  25 g BID    cholecalciferol  400 Units Daily    famotidine  20 mg Daily    heparin  5,000 Units Q12H    insulin glargine  15 Units Daily    insulin lispro  1-10 Units 4x Daily WC    insulin lispro  2 Units TID WC    lactulose  20 g Once    lactulose  20 g TID    multivitamin  1 tablet Daily    promethazine  12.5 mg TID    rifaximin  550 mg Q12H    sodium chloride (PF)  3 mL Q8H     IV Meds   dextrose-sodium chloride 5%-0.45% 75 mL/hr at 09/27/14 0156    diltiazem infusion 12 mg/hr (09/27/14 0723)    sodium chloride       Social History:   Lives with husband. Has 10 children. Was a homemaker.  Denies alcohol, drugs, tobacco.     Review of Systems: -   Constitutional: Denies fevers, chills, weight loss  ENT: negative for eye changes, oral lesions, difficulty swallowing  Cardiac: Denies chest pain or palpitations  Lungs: Denies cough or shortness of breath  GI: Negative except noted in the HPI  GU: Denies dysuria,  hematuria  Skin: Denies rashes or other skin lesions  MSK: Denies joint deformities, new joint pain  Lymph: Denies enlarged nodes, night sweats, abnormal bleeding  Psych: negative for depressed mood, anxiety  Neuro: negative for confusion, weakness, sensory changes    OBJECTIVE:   Vitals signs:   Latest Entry  Range (last 24 hours)    Temperature: 98.4 F (36.9 C)  Temp  Avg: 98 F (36.7 C)  Min: 97.4 F (36.3 C)  Max: 98.4 F (36.9 C)    Blood pressure (BP): 139/54 mmHg  BP  Min: 100/76  Max: 155/56    Heart Rate: 103  Pulse  Avg: 119.6  Min: 95  Max: 150    Respirations: 16  Resp  Avg: 18.5  Min: 14  Max: 25    SpO2: 98 %  SpO2  Avg: 98.5 %  Min: 97 %  Max: 100 %       No Data Recorded     Weight - scale: 52.617 kg (116 lb)  Percentage Weight Change (%): 0 %    Intake/Output       09/26/14 0600 - 09/27/14 0559 09/27/14 0600 - 09/28/14 0559      0998-3382 1800-0559 Total 0600-1759 1800-0559 Total       Intake    P.O.  357  357 714  150  -- 150    I.V.  316.3  1478.6 1794.9  243.6  -- 243.6    Total Intake 673.3 1835.6 2508.9 393.6 -- 393.6       Output    Urine  --  200 200  --  -- --    Total Output -- 200 200 -- -- --       Net I/O     673.3 1635.6 2308.9 393.6 -- 393.6        Respiratory support:  Oxygen Therapy  SpO2: 98 %  O2 Device: None (Room air)  O2 Flow Rate (L/min): 2 l/min     Physical Exam:  General:  Well developed, thin elderly Hispanic female in no apparent distress, somnolent, a&o to person and place (never oriented to date per  son)  HEENT:  No scleral icterus, Trachea midline, mucus membranes moist.  Lungs:  Clear to auscultation laterally. Normal respiratory effort.  Cardiovascular:  Regular rate and rhythm  Abdomen:  Abdomen soft, minimally tender to palpation throughout entire abdomen, bowel sound present. No peritoneal signs.  Extremities:  Trace peripheral edema RLE > LLE. Warm well-perfused.  Skin:  Non-icteric and no visible rash  Neuro:  Cranial nerves II-XII grossly intact. Unable to  keep arms up long enough to perform asterixis  Psych:  Normal affect.    Labs:     CBC  Recent Labs      09/24/14   2114  09/25/14   0818  09/26/14   2214   WBC  10.2*  11.5*  7.6   HGB  11.0*  11.6  8.8*   HCT  29.9*  31.7*  24.3*   PLT  60*  69*  53*   BAND  6   --    --    SEG  89*  88*   --    LYMPHS  2*  6*   --    MONOS  3*  5   --       Chemistry  Recent Labs      09/25/14   0818  09/26/14   2214   NA  136  139   K  4.2  3.4*   CL  99  105   BICARB  21*  21*   BUN  22*  29*   CREAT  0.68  0.64   GLU  243*  292*   [place]  7.9*  8.9   MG  2.1   --    PHOS  2.4*   --      Recent Labs      09/25/14   0818   ALK  98   AST  32   ALT  25   TBILI  3.56*   DBILI  1.3*   ALB  2.4*        Coags  Recent Labs      09/25/14   0818   PT  18.7*   INR  1.7       Radiology:   CT A/P w/ contrast 09/25/14: FINDINGS:  LUNG BASES: Small to moderate right pleural effusion with right lower lobe   atelectasis  LIVER/BILIARY:Diffuse nodularity and hepatic atrophy. Stable scattered tiny   low attenuation lesions in the liver.  PANCREAS: Stable numerous pancreatic cysts.  SPLEEN: Unremarkable.  ADRENAL GLANDS: Unremarkable  KIDNEYS: Stable left renal cyst.  STOMACH/DUODENUM: Mild wall thickening from the distal stomach to the proximal  jejunum  VASCULATURE: Patent TIPS. Stable nonocclusive thrombus of the main portal vein  and superior mesenteric vein. New thrombosis of the left portal vein.  LYMPHATIC: Unremarkable  SMALL & LARGE BOWEL: Unremarkable  BLADDER/PELVIC ORGANS: Unremarkable  BONES/SOFT TISSUES: Unremarkable  OTHER: Trace pelvic ascites    IMPRESSION:  1) New thrombosis of the left portal vein. Vessel was patent on prior   abdominal ultrasound 09/22/2014  2) Edema of the distal stomach through proximal jejunum suggestive of   enteritis, nonspecific    CT head 09/29/2014: IMPRESSION:  1. No evidence of acute intracranial hemorrhage or findings to suggest   territorial/transcortical infarct.  2. Mild white matter changes. These may be  associated with prior small vessel   ischemia. If there is concern for encephalopathy, MRI would be more helpful   for acute evaluation. MRI screening:  No intracranial or intraorbital metal.  3. Frothy secretions within the right sphenoid sinuses well as reactive bone   changes, suggestive of acute on chronic sinusitis.    RUQ U/S 09/25/2014: FINDINGS:  The liver measures 11.3 cm in long axis. It is normal in echogenicity. No   focal lesions are identified. There is no intra or extra hepatic bile duct   dilation. The common bile duct measures four mm. The gallbladder is not   visualized. A patent TIPS catheter is present. The TIPS velocities are: Portal  end 115 ; mid 93 ; hepatic 125 cm/s. Flow in the right and left portal veins   is hepatofugal and towards the TIPS ; flow in the main portal vein is   hepatopetal.  The right kidney measures nine cm in long axis and is within normal limits   with no hydronephrosis or renal calculi.  The visualized portion of the pancreas again demonstrates multiple cystic   lesions throughout the pancreas, as seen on prior imaging  The visualized aorta and inferior vena cava are within normal limits.  Right pleural effusion. No evidence of intra abdominal ascites.    IMPRESSION:  Patent TIPS catheter with normal velocities and appropriate flow in portal   veins.  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      Prior Endoscopy:   EGD 05/2013:  Findings: 1. Midline orophyarngeal prominence at the  arytenoid  2. Three columnns of grade 1 esophageal varices, no high  risk features  3. No gastric varices, mild portal hypertensive gastropathy  4. Few punctate clots in the antrum  5. Mild duodenal congestion consistent with portal  hypertension  Endoscopic Diagnosis: 1. Midline orophyarngeal  prominence at the arytenoid  2. Three columnns of grade 1 esophageal varices, no high  risk features  3. No gastric  varices, mild portal hypertensive gastropathy  4. Few punctate clots in the antrum  5. 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 Continue propranolol 10 BID  2. ENT consultation to evaluate oropharyngeal findings  3. 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 Confusion associated with poor medication compliance, including lactulose, N/V, abd pain, diarrhea, and f/c/s. HDS. Work-up here showed GI pathogen panel with norovirus.     #)Norovirus: likely responsible for N/V, abd pain and diarrhea  - CT A/P w/out acute pathology  - Follow up blood cultures: NGTD  - Supportive care with IVF and anti-emetics  - Would give 25% 25g albumin BID for albumin less than 3    #)Afib w rvr  S/p diltiazem, now rate controlled. CHADS score 2, chads vsc 4. High risk of fall due to her hepatic encephalopathy.    #)HCV GT2 cirrhosis c/b ascites, HE and diuretic resistant HHT now s/p TIPS 06/28/2014 and TIPS revision 09/14/14  - HE: still encephalopathic, likely related to infection and forgetting lactulose   - Continue lactulose for 3-4 soft bowel movements a day, and rifaximin  - Ascites: No ascites on recent U/S  - EV's:  S/p TIPS, no longer needs surveillance if TIPS is patent and she is stable  - HHT: S/p TIPS and TIPS revision   - RUQ U/S 2/13 with patent TIPS   - Hold diuretics while actively with norovirus (lasix 20, aldactone 50)  - Non-occlusive portal vein thrombosis: Multiple outpatient notes indicate poor medication adherence/knowledge, also very elderly, so no a/c  - HCC surveillance: Last CT on 06/27/2014 showed no HCC.  Repeat imaging in May 2016.  - Transplant: Age >70, not a candidate  - ECHO: ef 72%, pa pressure 34      DWA Dr. Carollee Herter, MD  GI fellow

## 2014-09-27 NOTE — Interdisciplinary (Signed)
Attempted to see patient for OT therapy eval, pt was still eating breakfast. Will re-attempt later today, time permitting.

## 2014-09-27 NOTE — Plan of Care (Signed)
Problem: Pain - Acute  Goal: Control of acute pain  Outcome: Met  Pt continues to deny pain this shift.

## 2014-09-27 NOTE — Plan of Care (Signed)
Problem: Discharge Planning  Goal: Participation in care planning  Outcome: Met  Pt is confused but family has been supportive and cooperative with POC. No dc plans at this time.

## 2014-09-27 NOTE — Plan of Care (Signed)
Problem: Infection  Goal: Absence of infection signs and symptoms  Outcome: Met  Pt was diagnosed with norovirus, on contact precautions. Continues to have diarrhea. Pt has been afebrile. WBC this am was 8.2.

## 2014-09-28 DIAGNOSIS — I4891 Unspecified atrial fibrillation: Secondary | ICD-10-CM

## 2014-09-28 DIAGNOSIS — Z4682 Encounter for fitting and adjustment of non-vascular catheter: Secondary | ICD-10-CM

## 2014-09-28 DIAGNOSIS — I481 Persistent atrial fibrillation: Secondary | ICD-10-CM

## 2014-09-28 DIAGNOSIS — I482 Chronic atrial fibrillation: Secondary | ICD-10-CM

## 2014-09-28 DIAGNOSIS — I480 Paroxysmal atrial fibrillation: Secondary | ICD-10-CM

## 2014-09-28 LAB — CBC WITH DIFF, BLOOD
ANC-Automated: 6.6 10*3/uL (ref 1.6–7.0)
Abs Lymphs: 0.5 10*3/uL — ABNORMAL LOW (ref 0.8–3.1)
Abs Monos: 0.6 10*3/uL (ref 0.2–0.8)
Hct: 24.1 % — ABNORMAL LOW (ref 34.0–45.0)
Hgb: 8.6 gm/dL — ABNORMAL LOW (ref 11.2–15.7)
Imm Gran %: 1 % (ref <1)
Imm Gran Abs: 0.1 10*3/uL (ref <0.1)
Lymphocytes: 7 % — ABNORMAL LOW (ref 19–53)
MCH: 33.7 pg — ABNORMAL HIGH (ref 26.0–32.0)
MCHC: 35.7 % (ref 32.0–36.0)
MCV: 94.5 um3 (ref 79.0–95.0)
MPV: 10.6 fL (ref 9.4–12.4)
Monocytes: 8 % (ref 5–12)
Plt Count: 63 10*3/uL — ABNORMAL LOW (ref 140–370)
RBC: 2.55 10*6/uL — ABNORMAL LOW (ref 3.90–5.20)
RDW: 19.9 % — ABNORMAL HIGH (ref 12.0–14.0)
Segs: 85 % — ABNORMAL HIGH (ref 34–71)
WBC: 7.8 10*3/uL (ref 4.0–10.0)

## 2014-09-28 LAB — BASIC METABOLIC PANEL, BLOOD
Anion Gap: 14 mmol/L (ref 7–15)
BUN: 38 mg/dL — ABNORMAL HIGH (ref 6–20)
Bicarbonate: 18 mmol/L — ABNORMAL LOW (ref 22–29)
Calcium: 9.3 mg/dL (ref 8.5–10.6)
Chloride: 103 mmol/L (ref 98–107)
Creatinine: 0.87 mg/dL (ref 0.51–0.95)
GFR: >60 mL/min
Glucose: 366 mg/dL — ABNORMAL HIGH (ref 70–115)
Potassium: 3.4 mmol/L — ABNORMAL LOW (ref 3.5–5.1)
Sodium: 135 mmol/L — ABNORMAL LOW (ref 136–145)

## 2014-09-28 LAB — URINE ELECTROLYTES
CHLORIDE, URINE: 20 mmol/L
POTASSIUM, URINE: 21 mmol/L
Sodium, Urine: 20 mmol/L

## 2014-09-28 LAB — ECG 12-LEAD
ATRIAL RATE: 174 {beats}/min
ATRIAL RATE: 108 {beats}/min
QRS INTERVAL/DURATION: 74 ms
QRS INTERVAL/DURATION: 76 ms
QT: 298 ms
QT: 376 ms
QTC INTERVAL: 481 ms
QTC INTERVAL: 487 ms
R AXIS: 57 degrees
R AXIS: 34 degrees
T AXIS: 229 degrees
T AXIS: 132 degrees
VENTRICULAR RATE: 157 {beats}/min
VENTRICULAR RATE: 101 {beats}/min

## 2014-09-28 LAB — CKMB+INDEX (NO CPK), BLOOD
CK-MB Index: 2.3 %
CK-MB: 3 ng/mL — ABNORMAL HIGH (ref 0.0–2.8)

## 2014-09-28 LAB — CPK-CREATINE PHOSPHOKINASE, BLOOD: CPK: 131 U/L (ref 0–175)

## 2014-09-28 LAB — C.DIFFICILE TOXIN PCR, STOOL: C.DIFFICILE TOXIN, PCR: NOT DETECTED

## 2014-09-28 LAB — BLOOD CULTURE
Blood Culture Result: NO GROWTH
Blood Culture Result: NO GROWTH

## 2014-09-28 LAB — TROPONIN T, BLOOD: TROPONIN T: <0.01 ng/mL (ref <0.01)

## 2014-09-28 LAB — MAGNESIUM, BLOOD: Magnesium: 2.7 mg/dL — ABNORMAL HIGH (ref 1.7–2.6)

## 2014-09-28 LAB — GLUCOSE (POCT)
GLUCOSE (POCT): 381 mg/dL — ABNORMAL HIGH (ref 70–115)
GLUCOSE (POCT): 402 mg/dL — ABNORMAL HIGH (ref 70–115)
GLUCOSE (POCT): 441 mg/dL — ABNORMAL HIGH (ref 70–115)
GLUCOSE (POCT): 326 mg/dL — ABNORMAL HIGH (ref 70–115)
Glucose (POCT): 229 mg/dL — ABNORMAL HIGH (ref 70–115)

## 2014-09-28 LAB — PHOSPHORUS, BLOOD: Phosphorous: 2.2 mg/dL — ABNORMAL LOW (ref 2.7–4.5)

## 2014-09-28 MED ORDER — ALBUMIN HUMAN 25 % IV SOLN
25.0000 g | Freq: Two times a day (BID) | INTRAVENOUS | Status: DC
Start: 2014-09-28 — End: 2014-09-28

## 2014-09-28 MED ORDER — INSULIN GLARGINE 100 UNIT/ML SC SOLN
25.0000 [IU] | Freq: Every day | SUBCUTANEOUS | Status: DC
Start: 2014-09-28 — End: 2014-09-29
  Administered 2014-09-28 – 2014-09-29 (×2): 25 [IU] via SUBCUTANEOUS
  Filled 2014-09-28 (×2): qty 25

## 2014-09-28 MED ORDER — RIVAROXABAN 10 MG PO TABS
15.0000 mg | ORAL_TABLET | Freq: Every day | ORAL | Status: DC
Start: 2014-09-28 — End: 2014-09-28

## 2014-09-28 MED ORDER — DILTIAZEM HCL ER BEADS 120 MG OR CP24
120.0000 mg | ORAL_CAPSULE | Freq: Every day | ORAL | Status: DC
Start: 2014-09-29 — End: 2014-09-29
  Filled 2014-09-28: qty 1

## 2014-09-28 MED ORDER — DILTIAZEM HCL ER BEADS 120 MG OR CP24
120.00 mg | ORAL_CAPSULE | Freq: Once | ORAL | Status: AC
Start: 2014-09-28 — End: 2014-09-28
  Administered 2014-09-28: 120 mg via ORAL
  Filled 2014-09-28: qty 1

## 2014-09-28 MED ORDER — INSULIN LISPRO (HUMAN) 100 UNIT/ML SC SOLN (CUSTOM)
7.0000 [IU] | Freq: Three times a day (TID) | INTRAMUSCULAR | Status: DC
Start: 2014-09-28 — End: 2014-09-29

## 2014-09-28 MED ORDER — LACTULOSE 10 GM/15ML OR SOLN
20.0000 g | Freq: Four times a day (QID) | ORAL | Status: DC
Start: 2014-09-28 — End: 2014-09-29

## 2014-09-28 MED ORDER — INSULIN LISPRO (HUMAN) 100 UNIT/ML SC SOLN
5.00 [IU] | Freq: Once | SUBCUTANEOUS | Status: AC
Start: 2014-09-28 — End: 2014-09-28
  Administered 2014-09-28: 5 [IU] via SUBCUTANEOUS
  Filled 2014-09-28: qty 5

## 2014-09-28 NOTE — Consults (Signed)
Vascular and Interventional Radiology Consultation Note    Date: September 28, 2014   Patient Name: Paula Manning   Medical Record #: [id number]   DOB: 06/29/1941, Age: 74 year old  Gender: female      History of Present Illness:     74 year old woman with pmh of HCV (GT2, achieved SVR), Cirrhosis (c/b ascites, HE, EV) s/p TIPS (06/2014) and recent TIPS revision (09/14/14), DM2, HTN, and recent UTI treated with 4 day course of abx by non-[place] PCP who presents with 4 days of nausea, vomiting, and 1 day of altered mental status.    VIR consulted for TIPS reduction to address possible HE.     Past Medical History   Diagnosis Date    Migraine headache     Glaucoma     Ascites 06/29/2007     History of paracentesis x 2    Pancreatic cyst 06/29/2007    Cirrhosis ([place]) 06/24/2007     Active on the liver transplant waiting list with a blood type of O positive. 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 Due to Hep C    Osteopenia 06/24/2007    Emphysema 06/24/2007    EV (esophageal varices) ([place]) 06/10/2007    Anemia 06/10/2007    HCV (hepatitis C virus) 06/10/2007     Achieved SVR.    Type II or unspecified type diabetes mellitus with peripheral circulatory disorders, not stated as uncontrolled(250.70) 06/10/2007    HTN 06/10/2007    HTN 06/10/2007       ALLERGIES:No Known Allergies    Current Facility-Administered Medications   Medication    albumin human 25 % injection 25 g    cholecalciferol (VITAMIN D) tablet 400 Units    dextrose 50 % solution 12.5 g    [START ON 09/29/2014] diltiazem (TIAZAC-24HR) ER capsule 120 mg    famotidine (PEPCID) tablet 20 mg    glucagon (GLUCAGON) injection 1 mg    glucose chewable tablet 16 g    glucose oral gel 1 Tube    insulin glargine (LANTUS) injection 25 Units    insulin lispro (HUMALOG) injection 1-10 Units    insulin lispro (HUMALOG) injection 7 Units    lactulose solution 20 g    meclizine (ANTIVERT) tablet 25 mg    multivitamin tablet 1 tablet     nalOXone (NARCAN) injection 0.1 mg    prochlorperazine (COMPAZINE) injection 10 mg    promethazine (PHENERGAN) injection 12.5 mg    rifaximin (XIFAXAN) tablet 550 mg    sodium chloride (PF) 0.9 % flush 3 mL    sodium chloride (PF) 0.9 % flush 3 mL    sodium chloride 0.9 % TKO infusion       Physical Exam  BP 142/41 mmHg   Pulse 122   Temp(Src) 99.1 F (37.3 C)   Resp 16   Ht 5' 1"  (1.549 m)   Wt 50.5 kg (111 lb 5.3 oz)   BMI 21.05 kg/m2   SpO2 100%    Laboratory data:   Lab Results   Component Value Date    INR 1.7 09/25/2014    PTT 43.5 09/27/2014     Lab Results   Component Value Date    WBC 7.8 09/28/2014    HGB 8.6 09/28/2014    HCT 24.1 09/28/2014    PLT 63 09/28/2014    PLT 47 04/23/2009    LYMPHS 7 09/28/2014    LYMPHS 16 05/18/2008     Lab Results  Component Value Date    NA 135 09/28/2014    K 3.4 09/28/2014    CL 103 09/28/2014    BICARB 18 09/28/2014    BUN 38 09/28/2014    CREAT 0.87 09/28/2014    GLU 366 09/28/2014    [place] 9.3 09/28/2014     Lab Results   Component Value Date    AST 32 09/25/2014    ALT 25 09/25/2014    ALK 98 09/25/2014    TBILI 3.56 09/25/2014    DBILI 1.3 09/25/2014    TP 6.0 09/25/2014    ALB 2.4 09/25/2014       Relevant Imaging:  CT abdomen- new left portal vein thrombosis.  Edema of stomach and proximal SB may be due to liver disease or enteritis.     Assessment and Plan:  In summary, this patient is a 74 year old female with recent TIPS revision for recurrent hydrothorax.  She now presents with UTI adn confusion.  Picture is complicated with possible medication non-compliance and septic delerium vs HE.  Given how recent her TIPS was up-sized, would favor maximal medical therapy for treatment of possible HE and   - If patient does not improve and it appears HE is refractory, can consider TIPS revision next week.    - Patient will be NPO for the procedure.    - Consent to be obtained.      D/w Dr. Ladell Heads.       Dr. Lorra Hals VIR resident.

## 2014-09-28 NOTE — Plan of Care (Signed)
Problem: Discharge Planning  Goal: Participation in care planning  No d/c plan at this time. Plan for TIPS revision to be done today. Pt lives with family but might need short SNF stay when medically ready for d/c. CM and SW following as needed. CM talked with family today about d/c plan. No other needs noted. Will cont to monitor.     Problem: Falls, Risk of  Goal: Keep patient free from falls utilizing universal fall precautions  Fall risk protocol remains in place. Family at bedside 24 hrs a day. Pt does not try to get oob/pull at tubes. Bed remains in lowest position with side rails up x3 for safety. Bed locked and alarm on. Environment monitored for safety and hourly rounds provided. Will cont to monitor.     Problem: Tissue Perfusion, Cardiopulmonary - Altered  Goal: Hemodynamic stability  Pt with controlled a-fib. All other VSS. Daily labs monitored. Lungs clear to dim on room air. Plan for TIPS revision to be done today, pt remains lethargic but arrousable. Will cont to monitor closely.      Problem: Pain - Acute  Goal: Communication of presence of pain  Pt able to verbalize if in pain. Denies any pain at this time. Will cont to monitor and reassess.     Problem: Alteration in Blood Glucose  Goal: Glucose level within specified parameters  Pts FS remain elevated, 300-400's. Mds aware insulin increased. Pt now NPO. Will cont to monitor and reassess.     Problem: Infection  Goal: Absence of infection signs and symptoms  Pt remains afebrile. VSS. Daily cbc followed. Will cont to monitor.     Problem: Skin Integrity- Intact patient high risk for impaired skin integrity Braden scale less than or equal to 18  Goal: Skin remains intact  Cont to turn pt q2hrs and monitored frequently for incont. Protective oint applied to buttocks, remains CDI. No other needs noted. Will cont to monitor.     Problem: Aspiration, Risk of  Goal: Evidence of swallowing difficulties is absent  Pt lethargic, strict aspiration precautions  followed. Pt made NPO. Will cont to monitor.

## 2014-09-28 NOTE — Progress Notes (Addendum)
Inpatient Medicine Progress Note     Patient Name: Paula Manning  MRN: [id number]    [facility] Stay:   5 days - Admitted on: 09/27/2014      ID: Mrs. 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 Patient is stable currently with improved mentation but still have poor PO intake. Hospital course complicated by afib with RVR.     Interval Events/Subjective:   --Patient was able to sleep yesterday after getting ramelteon   --Family had a family discussion yesterday after talking with our team regarding the risks/benefits/alternatives of anticoagulation given the patient's high risk of stroke (4% annual risk based on CHADSVASC score of 4). They say that her husband and other family members are going to take charge of her medications and her care given that she is at very risk for confusion/hepatic encephalopathy from her TIPSS. The family is currently working on safeproofing the home in preparation for her discharge. Today, they said that they they would like for her to get anticoagulation. We have discussed with pharmacy and her insurance is able to get xarelto with a small copay for the family.     Vitals signs:     Latest Entry  Range (last 24 hours)    Temperature: 98 F (36.7 C)  Temp  Avg: 97.8 F (36.6 C)  Min: 97.4 F (36.3 C)  Max: 98 F (36.7 C)    Blood pressure (BP): 124/58 mmHg  BP  Min: 108/52  Max: 142/53    Heart Rate: 104  Pulse  Avg: 103.5  Min: 94  Max: 119    Respirations: 15  Resp  Avg: 18.9  Min: 15  Max: 22    SpO2: 98 %  SpO2  Avg: 98 %  Min: 96 %  Max: 100 %       No Data Recorded     Yesterday's intake and output:  02/17 0600 - 02/18 0559  In: 1136.6 [P.O.:650; I.V.:486.6]  Out: -     Physical Exam  GENERAL:  Thin appearing woman with family at bedside. Pleasant and cooperative.     HEENT: EOMI. Oropharynx appears  moist. No LAD.   CARDIOVASCULAR:  Tachycardic and irregular rhythm. Mild flow murmur. No rubs/gallops/clicks.  LUNGS: CTAB  ABDOMEN: Soft, non-distended. Normoactive bowel sounds. Non-tender  EXTREMITIES: Warm and well perfused.   NEURO: CN II-XII grossly intact. No asteris.    Labs (reviewed and notable for):    WBC/HGB/HCT/PLT:  7.8/8.6/24.1/63 (02/18 0551)  BMP:  135/3.4/103/18/38/0.87/366 (02/18 0551)    Micro  GI pathogen panel 2/14 - norovirus   BCx 2/14 - NGTD x24    Radiology/Imaging/Procedures:   NO new.     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 Patient is stable currently with improved mentation but still have poor PO intake. GI pathogen positive for norovirus.    #Afib w/ RVR: most likely multifactorial from dehydration + diarrhea. Family has decided to pursue anticoagulation with xarelto based on their family meeting yesterday.   --Oral diltiazem 30 mg q6 hrs and titrate as needed  --Will hold xarelto 15 mg daily for now pending liver team's decision  to contact IR to close the diameter of the patient's TIPSS    #Diarrhea from norovirus: resolved.   --Hydration, encourage oral intake, and also contact precautions    #HCV cirrhosis: status post TIPS (06/2014) with recent revision no 09/14/14. Abdominal ultrasound on admission shows patency of vessels.   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   --Ascites: none. Has TIPS.   --SBP: no ascites.   --HCC: abdominal ultrasound without suspicious lesions. Although quad-phase CT scan would be better to visualize lesions.    #DM2  --Continue to monitor and adjust as needed    #Osteoporosis  --Continue home calcium and vitamin D  --Holding bisphosphonates    #GERD:   --Continue pepcid 20 mg daily    DIET: Diet Custom: Carb Limited (4 Carbs)  Nutritional  Supplement Glucerna  VTE PPx: SQ heparin   CODE : Full Code  DISPO: Pending clinical improvement.     Patient was seen, examined and discussed with attending, Dr. Ascencion Dike, who agrees with above plan.     Fausto Skillern, MD  PGY-1  Internal Medicine     ATTENDING PROGRESS NOTE ATTESTATION    Subjective    Patient with mild increased confusion, less abdominal pain.     Objective    I have examined the patient and concur with the resident exam.    Assessment and Plan    I agree with the resident care plan.    See the resident note for further details.    Noberto Retort, M.D.  Hospital Medicine Attending  PID [phone]

## 2014-09-28 NOTE — Interdisciplinary (Signed)
Case manager spoke with the patient's son about the discharge plan. He was asking about increasing her IHSS hours and possibly changing caregivers. Social Worker to speak with family re this. Case manager discussed home health services as an option but recommended SNF placement initially until the patient improves and is closer to her baseline. PT and OT were not able to work with the patient this morning as she was not able to wake the patient for therapy.     Case manager initiated a SNF referral and will continue to follow for discharge planning.

## 2014-09-28 NOTE — Plan of Care (Signed)
Pt remains drowsy, easily arroused but quickly falls back asleep. Will state name and follow commands while awake. Family remains at bedside. Team rounding and aware of all pt assessment. Vss. Cont to turn q2hrs. Fall risk protocol remains in place. Aspiration precautions followed. Pt sleeping and not eating at this time. No other needs noted. Will cont to monitor.

## 2014-09-28 NOTE — Plan of Care (Signed)
Problem: Discharge Planning  Goal: Participation in care planning  Outcome: Met  Pt is AOX1, to her self only. Pt's family at bedside and supportive. Lactulose given yesterday for hepatic encephalopathy. Had 8 BM to this time on NOC shift.     Problem: Falls, Risk of  Goal: Keep patient free from falls utilizing universal fall precautions  Outcome: Met  Pt is confused and impulsive. Pt is on high fall risk. Family always at bedside. Bed at lowest setting, breaks applied, side rails up x 3. Hourly rounds ongoing. No near fall or actual fall at this time.     Problem: Tissue Perfusion, Cardiopulmonary - Altered  Goal: Hemodynamic stability  Outcome: Met  Pt still A-Fib at 90-100 bpms. Q6H Dilt PO given, see eMAR for administration details. Denies chest pain. Latest Troponin <0.01. Will follow up cardiac markers this morning.     Problem: Pain - Acute  Goal: Control of acute pain  Pt sleeping at this time. Pain assessment ongoing. No pain complaint noted.

## 2014-09-28 NOTE — Plan of Care (Signed)
Report given to night nurse. All poc/assessment/vs/labs/meds discussed. Endorsed straight cath and keofeed if needed. No acute distress noted. Care endorsed.

## 2014-09-28 NOTE — Plan of Care (Signed)
Pt assessment and neuro checks remain the same. Pt remains lethargic. New IV x2 started, oud IV outdated. Paged md that pt has had no UO since this am. Asked for maint fluids while NPO. Vss. No acute distress noted. Cont to turn q2hrs. Fall risk protocol remains in place. Family remains at bedside. Will cont to monitor.

## 2014-09-28 NOTE — Plan of Care (Signed)
Paged md again that pt cont to be lethargic and will wake up but when giving fluids coughing noted. Unable to give PO meds at this time d/t aspiration risks. Also paged again that pt has not voided since this am and NPO, would you like maint fluids. No other needs noted. Will cont to monitor.

## 2014-09-28 NOTE — Progress Notes (Signed)
Hepatology Progress Note  Consult Attending: Dr. Andreas Ohm, MD  Consult Fellow:  Gerrit Heck, MD  Current Attending: Dannette Barbara, MD  Date: 09/27/2014    24h events: pt with improved MS this am but not back to her baseline. She had 8 BM's yesterday.    Past Medical History:  Past Medical History   Diagnosis Date    Migraine headache     Glaucoma     Ascites 06/29/2007     History of paracentesis x 2    Pancreatic cyst 06/29/2007    Cirrhosis ([place]) 06/24/2007     Active on the liver transplant waiting list with a blood type of O positive. 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 Due to Hep C    Osteopenia 06/24/2007    Emphysema 06/24/2007    EV (esophageal varices) ([place]) 06/10/2007    Anemia 06/10/2007    HCV (hepatitis C virus) 06/10/2007     Achieved SVR.    Type II or unspecified type diabetes mellitus with peripheral circulatory disorders, not stated as uncontrolled(250.70) 06/10/2007    HTN 06/10/2007    HTN 06/10/2007       Past Surgical History:   Past Surgical History   Procedure Laterality Date    Pb laps surg cholecstc w/expl common duct  1970s     in [place];  open CCX    Pb cesarean delivery only      Pb cstoplasty/cstourtp plstc any       Bladder suspension    Egd procedure  06/01/2013     Procedure: GI EGD;  Surgeon: Jeanette Caprice, MD;  Location: [facility];  Service: Gastroenterology;;    Tips procedure         Allergies:  No Known Allergies    Home Medications:  Prescriptions prior to admission   Medication Sig Dispense Refill Last Dose    alendronate (FOSAMAX) 70 MG tablet    09/22/2014    ALPHAGAN P 0.1 % ophthalmic solution    09/22/2014    bacitracin 500 UNIT/GM ointment Apply small amount to affected area on face twice daily 1 Tube 1 09/22/2014    CALCIUM CARBONATE 600 MG OR TABS 1 tablet 2 times daily one month 11 09/22/2014    ciprofloxacin (CIPRO) 500 MG tablet Take 1 tablet by mouth daily. 30 tablet 5 09/22/2014    furosemide (LASIX) 20 MG tablet  Take 2 tablets by mouth daily. 90 tablet 0 09/22/2014    glipiZIDE (GLUCOTROL) 5 MG tablet Take 0.5 tablets (2.5 mg) by mouth 2 times daily. 30 tablet 1 09/22/2014    insulin glargine (LANTUS) 100 UNIT/ML injection Inject 20 Units under the skin daily. 10 mL 1 09/22/2014    lactulose 10 GM/15ML solution Take 30 mLs (20 g) by mouth 3 times daily. 946 mL 0 09/22/2014    LUMIGAN 0.01 %    09/22/2014    multivitamin (MULTI-VITAMIN) tablet Take 1 tablet by mouth daily.   09/22/2014    ranitidine (ZANTAC) 300 MG tablet Take 300 mg by mouth 2 times daily.   09/22/2014    rifaximin (XIFAXAN) 550 MG tablet Take 550 mg by mouth every 12 hours.   09/22/2014    sitagliptin (JANUVIA) 50 MG tablet Take 1 tablet (50 mg) by mouth daily. 60 tablet 1 09/22/2014    spironolactone (ALDACTONE) 50 MG tablet Take 1 tablet (50 mg) by mouth daily. 30 tablet 0 09/22/2014    VITAMIN D 400 UNIT OR CAPS 1 CAPSULE DAILY 30  11 09/22/2014       Inpatient Medications:  Scheduled Meds   albumin human  25 g BID    cholecalciferol  400 Units Daily    [START ON 09/29/2014] diltiazem  120 mg Daily    famotidine  20 mg Daily    insulin glargine  25 Units Daily    insulin lispro  1-10 Units 4x Daily WC    insulin lispro  7 Units TID WC    lactulose  20 g 4x Daily    multivitamin  1 tablet Daily    promethazine  12.5 mg TID    rifaximin  550 mg Q12H    sodium chloride (PF)  3 mL Q8H     IV Meds   sodium chloride       Social History:   Lives with husband. Has 10 children. Was a homemaker.  Denies alcohol, drugs, tobacco.     Review of Systems: -   Constitutional: Denies fevers, chills, weight loss  ENT: negative for eye changes, oral lesions, difficulty swallowing  Cardiac: Denies chest pain or palpitations  Lungs: Denies cough or shortness of breath  GI: Negative except noted in the HPI  GU: Denies dysuria, hematuria  Skin: Denies rashes or other skin lesions  MSK: Denies joint deformities, new joint pain  Lymph: Denies enlarged nodes, night  sweats, abnormal bleeding  Psych: negative for depressed mood, anxiety  Neuro: negative for confusion, weakness, sensory changes    OBJECTIVE:   Vitals signs:   Latest Entry  Range (last 24 hours)    Temperature: 98.7 F (37.1 C)  Temp  Avg: 97.9 F (36.6 C)  Min: 97.4 F (36.3 C)  Max: 98.7 F (37.1 C)    Blood pressure (BP): 137/47 mmHg  BP  Min: 108/52  Max: 141/50    Heart Rate: 105  Pulse  Avg: 104.2  Min: 94  Max: 119    Respirations: 16  Resp  Avg: 18.7  Min: 15  Max: 22    SpO2: 98 %  SpO2  Avg: 98 %  Min: 96 %  Max: 100 %       No Data Recorded     Weight - scale: 50.5 kg (111 lb 5.3 oz)  Percentage Weight Change (%): -4.02 %    Intake/Output       09/27/14 0600 - 09/28/14 0559 09/28/14 0600 - 09/29/14 0559      6010-9323 1800-0559 Total 0600-1759 1800-0559 Total       Intake    P.O.  490  160 650  100  -- 100    I.V.  483.6  3 486.6  103  -- 103    Total Intake 973.6 163 1136.6 203 -- 203       Output    Total Output -- -- -- -- -- --       Net I/O     973.6 163 1136.6 203 -- 203        Respiratory support:  Oxygen Therapy  SpO2: 98 %  O2 Device: None (Room air)  O2 Flow Rate (L/min): 2 l/min     Physical Exam:  General:  Well developed, thin elderly Hispanic female in no apparent distress, somnolent, a&o to person and place (never oriented to date per son)  HEENT:  No scleral icterus, Trachea midline, mucus membranes moist.  Lungs:  Clear to auscultation laterally. Normal respiratory effort.  Cardiovascular:  Regular rate and rhythm  Abdomen:  Abdomen soft, no  tender to palpation, bowel sound present. No peritoneal signs.  Extremities:  Trace peripheral edema RLE > LLE. Warm well-perfused.  Skin:  Non-icteric and no visible rash  Neuro:  Cranial nerves II-XII grossly intact. Mild asterixis  Psych:  Normal affect.    Labs:     CBC  Recent Labs      09/27/14   0605  09/28/14   0551   WBC  8.2  7.8   HGB  9.2*  8.6*   HCT  27.3*  24.1*   PLT  67*  63*   BAND  3   --    SEG  84*  85*   LYMPHS  9*  7*      MONOS  3*  8      Chemistry  Recent Labs      09/27/14   0605  09/28/14   0551   NA  136  135*   K  4.2  3.4*   CL  105  103   BICARB  18*  18*   BUN  30*  38*   CREAT  0.65  0.87   GLU  377*  366*   [place]  8.8  9.3   MG  2.5  2.7*   PHOS  2.0*  2.2*     No results for input(s): ALK, AST, ALT, TBILI, DBILI, ALB in the last 72 hours.     Coags  Recent Labs      09/27/14   1000   PTT  43.5*       Radiology:   CT A/P w/ contrast 09/25/14: FINDINGS:  LUNG BASES: Small to moderate right pleural effusion with right lower lobe   atelectasis  LIVER/BILIARY:Diffuse nodularity and hepatic atrophy. Stable scattered tiny   low attenuation lesions in the liver.  PANCREAS: Stable numerous pancreatic cysts.  SPLEEN: Unremarkable.  ADRENAL GLANDS: Unremarkable  KIDNEYS: Stable left renal cyst.  STOMACH/DUODENUM: Mild wall thickening from the distal stomach to the proximal  jejunum  VASCULATURE: Patent TIPS. Stable nonocclusive thrombus of the main portal vein  and superior mesenteric vein. New thrombosis of the left portal vein.  LYMPHATIC: Unremarkable  SMALL & LARGE BOWEL: Unremarkable  BLADDER/PELVIC ORGANS: Unremarkable  BONES/SOFT TISSUES: Unremarkable  OTHER: Trace pelvic ascites    IMPRESSION:  1) New thrombosis of the left portal vein. Vessel was patent on prior   abdominal ultrasound 09/17/2014  2) Edema of the distal stomach through proximal jejunum suggestive of   enteritis, nonspecific    CT head 10/05/2014: IMPRESSION:  1. No evidence of acute intracranial hemorrhage or findings to suggest   territorial/transcortical infarct.  2. Mild white matter changes. These may be associated with prior small vessel   ischemia. If there is concern for encephalopathy, MRI would be more helpful   for acute evaluation. MRI screening: No intracranial or intraorbital metal.  3. Frothy secretions within the right sphenoid sinuses well as reactive bone   changes, suggestive of acute on chronic sinusitis.    RUQ U/S 09/27/2014: FINDINGS:  The liver  measures 11.3 cm in long axis. It is normal in echogenicity. No   focal lesions are identified. There is no intra or extra hepatic bile duct   dilation. The common bile duct measures four mm. The gallbladder is not   visualized. A patent TIPS catheter is present. The TIPS velocities are: Portal  end 115 ; mid 93 ; hepatic 125 cm/s. Flow in the right and left portal veins  is hepatofugal and towards the TIPS ; flow in the main portal vein is   hepatopetal.  The right kidney measures nine cm in long axis and is within normal limits   with no hydronephrosis or renal calculi.  The visualized portion of the pancreas again demonstrates multiple cystic   lesions throughout the pancreas, as seen on prior imaging  The visualized aorta and inferior vena cava are within normal limits.  Right pleural effusion. No evidence of intra abdominal ascites.    IMPRESSION:  Patent TIPS catheter with normal velocities and appropriate flow in portal   veins.  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      Prior Endoscopy:   EGD 05/2013:  Findings: 1. Midline orophyarngeal prominence at the  arytenoid  2. Three columnns of grade 1 esophageal varices, no high  risk features  3. No gastric varices, mild portal hypertensive gastropathy  4. Few punctate clots in the antrum  5. Mild duodenal congestion consistent with portal  hypertension  Endoscopic Diagnosis: 1. Midline orophyarngeal  prominence at the arytenoid  2. Three columnns of grade 1 esophageal varices, no high  risk features  3. No gastric varices, mild portal hypertensive gastropathy  4. Few punctate clots in the antrum  5. 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 Continue propranolol 10 BID  2. ENT consultation to evaluate oropharyngeal findings  3. Repeat EGD in 1 year for varices screening      ASSESSMENT / PLAN:  Paula Manning is a 74 year old female  F with HCV GT 2 cirrhosis (achieved SVR) complicated by ascites, HE and diuretic resistant HHT now s/p TIPS 06/28/2014 with hepatic venous pressure gradient decreasing from 22 to 7 mm Hg and TIPS revision 09/14/14 for pleural effusion, with gradient from 13 to 6 presents to the hospital with confusion. Confusion associated with poor medication compliance, including lactulose, N/V, abd pain, diarrhea, and f/c/s. HDS. Work-up here showed GI pathogen panel with norovirus.     #)Norovirus: likely responsible for N/V, abd pain and diarrhea  - CT A/P w/out acute pathology  - Follow up blood cultures: NGTD  - Supportive care with IVF and anti-emetics  - Would give 25% 25g albumin BID for albumin less than 3    #)Afib w rvr  S/p diltiazem, now rate controlled. CHADS score 2, chads vsc 4. High risk of fall due to her hepatic encephalopathy, highly concerned for the possibility of anticoagulation. We would hold anticoagulation at this time. Patient also will be evaluated from IR for possible downsizing her transjugular intrahepatic portosystemic shunt, as her MS not yet back to her baseline.  - hold anticoagulation    #)HCV GT2 cirrhosis c/b ascites, HE and diuretic resistant HHT now s/p TIPS 06/28/2014 and TIPS revision 09/14/14  - HE: still encephalopathic but improved, likely related to infection and TIPS revision,    - Continue lactulose for 3-4 soft bowel movements a day, and rifaximin  - Ascites: No ascites on recent U/S, continue to hold her diuretics  - EV's: S/p TIPS, no longer needs surveillance if TIPS is patent and she is stable  - HHT: S/p TIPS and TIPS revision   - RUQ U/S 2/13 with patent TIPS   - Hold diuretics while actively with norovirus (lasix 20, aldactone 50)  - Non-occlusive portal vein thrombosis: Multiple outpatient notes indicate poor medication adherence/knowledge, also very elderly, so no  a/c  - [place] surveillance: Last CT on 06/27/2014 showed no HCC. Repeat imaging in May 2016.  - Transplant: Age >70,  not a candidate  - ECHO: EF 72%, PA pressure 34  - Check LFT's  - IR consult for possible downsizing her transjugular intrahepatic portosystemic shunt      DWA Dr. Carollee Herter, MD  GI fellow

## 2014-09-28 NOTE — Interdisciplinary (Signed)
Attempted to see pt this AM, but unable to wake pt up (trap squeeze and sternal rub). Per RN and family members at bedside, pt had not slept for 48 hours and was given sleeping pills last night. Will try to see pt at another time as staffing and time permit. Thank you.

## 2014-09-28 NOTE — Interdisciplinary (Signed)
OT consult received and appreciated, chart reviewed. Pt currently unarousable and inappropriate for OT evaluation at this time. Will follow up at later time as timing and staffing allows. Thank you

## 2014-09-28 NOTE — Plan of Care (Signed)
MD at bedside to assess pt. Bladder scanned per MD request. Pt has hst of ascites. Bladder scan showed 400-856ml. Per md straight cath and sent urine lytes. Pt incont of stool, md aware. Has sm bloody mucous, md assessed and no need for intervention. Placed order for keofeed, but per md can hold off and place if needed for meds. Pt does not need any PO meds at this time and had goal of 3 Bms so can hold lactulose. Additional insulin given per md for cont elevated blood sugar. Family at bedside and aware of all plan. Will cont to monitor.

## 2014-09-28 NOTE — Plan of Care (Signed)
Received report from night nurse. Pt lethargic, arroused with stimulation but quickly falls back asleep. Pt given sleeping pill and per family has not slept for several days. Mds aware of am lab results. Pt had >8 BMs last pm, will hold lactulose this am. A-fib, controlled. All other vss. Fall risk protocol in place, family at bedside 24hrs/day. Cont to turn pt q2hrs. Pt localizes to pain, PERRL. Moves all extremities spon. Good pulses and cap refill in upper and lower extremities. Trace edema to LE. Lungs clear to dim on room air. Good bowel sounds, no Bm at this time. Discoloration to LE, scattered ecchymosis to UE. No other needs noted. Will cont to monitor.

## 2014-09-29 DIAGNOSIS — E1140 Type 2 diabetes mellitus with diabetic neuropathy, unspecified: Secondary | ICD-10-CM

## 2014-09-29 DIAGNOSIS — R1312 Dysphagia, oropharyngeal phase: Secondary | ICD-10-CM

## 2014-09-29 DIAGNOSIS — I10 Essential (primary) hypertension: Secondary | ICD-10-CM

## 2014-09-29 DIAGNOSIS — E1165 Type 2 diabetes mellitus with hyperglycemia: Secondary | ICD-10-CM

## 2014-09-29 LAB — CBC WITH DIFF, BLOOD
ANC-Automated: 6.8 10*3/uL (ref 1.6–7.0)
Abs Eosinophils: 0.1 10*3/uL (ref 0.1–0.7)
Abs Lymphs: 0.6 10*3/uL — ABNORMAL LOW (ref 0.8–3.1)
Abs Monos: 0.6 10*3/uL (ref 0.2–0.8)
Eosinophils: 1 % (ref 1–4)
Hct: 22.4 % — ABNORMAL LOW (ref 34.0–45.0)
Hgb: 7.9 gm/dL — ABNORMAL LOW (ref 11.2–15.7)
Lymphocytes: 7 % — ABNORMAL LOW (ref 19–53)
MCH: 34.1 pg — ABNORMAL HIGH (ref 26.0–32.0)
MCHC: 35.3 % (ref 32.0–36.0)
MCV: 96.6 um3 — ABNORMAL HIGH (ref 79.0–95.0)
MPV: 9.8 fL (ref 9.4–12.4)
Monocytes: 8 % (ref 5–12)
Plt Count: 58 10*3/uL — ABNORMAL LOW (ref 140–370)
RBC: 2.32 10*6/uL — ABNORMAL LOW (ref 3.90–5.20)
RDW: 20.8 % — ABNORMAL HIGH (ref 12.0–14.0)
Segs: 85 % — ABNORMAL HIGH (ref 34–71)
WBC: 8.1 10*3/uL (ref 4.0–10.0)

## 2014-09-29 LAB — COMPREHENSIVE METABOLIC PANEL, BLOOD
ALT (SGPT): 17 U/L (ref 0–33)
AST (SGOT): 23 U/L (ref 0–32)
Albumin: 3.6 g/dL (ref 3.5–5.2)
Alkaline Phos: 54 U/L (ref 35–140)
Anion Gap: 11 mmol/L (ref 7–15)
BUN: 35 mg/dL — ABNORMAL HIGH (ref 6–20)
Bicarbonate: 20 mmol/L — ABNORMAL LOW (ref 22–29)
Bilirubin, Tot: 3.73 mg/dL — ABNORMAL HIGH (ref <1.20)
Calcium: 9.1 mg/dL (ref 8.5–10.6)
Chloride: 107 mmol/L (ref 98–107)
Creatinine: 0.74 mg/dL (ref 0.51–0.95)
GFR: >60 mL/min
Glucose: 174 mg/dL — ABNORMAL HIGH (ref 70–115)
Potassium: 3.2 mmol/L — ABNORMAL LOW (ref 3.5–5.1)
Sodium: 138 mmol/L (ref 136–145)
Total Protein: 5.3 g/dL — ABNORMAL LOW (ref 6.0–8.0)

## 2014-09-29 LAB — MAGNESIUM, BLOOD: Magnesium: 2.6 mg/dL (ref 1.7–2.6)

## 2014-09-29 LAB — PHOSPHORUS, BLOOD: Phosphorous: 1.6 mg/dL — ABNORMAL LOW (ref 2.7–4.5)

## 2014-09-29 LAB — GLUCOSE (POCT)
GLUCOSE (POCT): 190 mg/dL — ABNORMAL HIGH (ref 70–115)
GLUCOSE (POCT): 215 mg/dL — ABNORMAL HIGH (ref 70–115)
GLUCOSE (POCT): 185 mg/dL — ABNORMAL HIGH (ref 70–115)
Glucose (POCT): 262 mg/dL — ABNORMAL HIGH (ref 70–115)

## 2014-09-29 LAB — PROTHROMBIN TIME, BLOOD
INR: 2.1
PT,Patient: 22.6 s — ABNORMAL HIGH (ref 9.7–12.5)

## 2014-09-29 MED ORDER — INSULIN GLARGINE 100 UNIT/ML SC SOLN
30.0000 [IU] | Freq: Every day | SUBCUTANEOUS | Status: DC
Start: 2014-09-30 — End: 2014-10-01
  Administered 2014-09-30: 30 [IU] via SUBCUTANEOUS
  Filled 2014-09-29: qty 30

## 2014-09-29 MED ORDER — POTASSIUM CHLORIDE CRYS CR 10 MEQ OR TBCR
30.0000 meq | EXTENDED_RELEASE_TABLET | Freq: Once | ORAL | Status: DC
Start: 2014-09-29 — End: 2014-09-29

## 2014-09-29 MED ORDER — DILTIAZEM HCL 30 MG OR TABS
30.0000 mg | ORAL_TABLET | Freq: Four times a day (QID) | ORAL | Status: DC
Start: 2014-09-29 — End: 2014-10-01
  Administered 2014-09-29 – 2014-10-01 (×8): 30 mg via ORAL
  Filled 2014-09-29 (×8): qty 1

## 2014-09-29 MED ORDER — POTASSIUM CHLORIDE 20 MEQ/15ML (10%) OR SOLN
30.00 meq | Freq: Once | ORAL | Status: AC
Start: 2014-09-29 — End: 2014-09-29
  Administered 2014-09-29: 30 meq via NASOGASTRIC
  Filled 2014-09-29: qty 30

## 2014-09-29 MED ORDER — INSULIN LISPRO (HUMAN) 100 UNIT/ML SC SOLN (CUSTOM)
9.0000 [IU] | Freq: Three times a day (TID) | INTRAMUSCULAR | Status: DC
Start: 2014-09-29 — End: 2014-10-01
  Administered 2014-09-30 (×3): 4 [IU] via SUBCUTANEOUS
  Filled 2014-09-29 (×3): qty 9

## 2014-09-29 MED ORDER — POTASSIUM PHOSPHATE 10 MEQ/100 ML D5W
10.00 meq | Freq: Once | Status: AC
Start: 2014-09-29 — End: 2014-09-29
  Administered 2014-09-29: 10 meq via INTRAVENOUS
  Filled 2014-09-29: qty 100

## 2014-09-29 MED ORDER — FUROSEMIDE 20 MG OR TABS
20.0000 mg | ORAL_TABLET | Freq: Every day | ORAL | Status: DC
Start: 2014-09-29 — End: 2014-10-03
  Administered 2014-09-29 – 2014-10-03 (×5): 20 mg via ORAL
  Filled 2014-09-29 (×5): qty 1

## 2014-09-29 MED ORDER — SPIRONOLACTONE 100 MG OR TABS
50.0000 mg | ORAL_TABLET | Freq: Every day | ORAL | Status: DC
Start: 2014-09-29 — End: 2014-10-05
  Administered 2014-09-29 – 2014-10-05 (×7): 50 mg via ORAL
  Filled 2014-09-29 (×7): qty 2

## 2014-09-29 MED ORDER — LACTULOSE 10 GM/15ML OR SOLN
20.0000 g | Freq: Two times a day (BID) | ORAL | Status: DC
Start: 2014-09-29 — End: 2014-10-12
  Administered 2014-09-29 – 2014-10-12 (×24): 20 g via ORAL
  Filled 2014-09-29 (×24): qty 30

## 2014-09-29 NOTE — Plan of Care (Signed)
Report given to night nurse. Vss. Pt slowly eating with strict aspiration precautions. Pts family helping her eat and aware of teaspoon only for liq and s&s of aspiration. No signs of aspiration noted. All poc/assessment/vs/labs/meds discussed. Care endorsed.

## 2014-09-29 NOTE — Plan of Care (Signed)
Received report from night nurse. Vss. No acute distress noted. Pt awake today, a&ox4. Following commands, talking appropriately to family. Moves all extremities. Denies any pain. Md aware of am lab results. Pt cont to cough with fluids, asked md to switch ER meds to something that can be crushed and get speech eval to assess swallowing. Family at bedside. Good pulses and cap refill in upper and lower extremities. Slight edema to LE. Lungs clear to dim on room air. Good bowel sounds, no BM at this time. NG tube in place for meds. NPO by mouth until speech eval. Cont to turn q2hrs. Fall risk protocol remains in place. No other needs noted. Will cont to monitor.

## 2014-09-29 NOTE — Progress Notes (Signed)
Inpatient Medicine Progress Note       Interval Events/Subjective:   Had NG tube placed given too encephalopathic to safely take PO       Vitals signs:     Latest Entry  Range (last 24 hours)    Temperature: 97.8 F (36.6 C)  Temp  Avg: 98.5 F (36.9 C)  Min: 97.8 F (36.6 C)  Max: 99.1 F (37.3 C)    Blood pressure (BP): 129/56 mmHg  BP  Min: 112/42  Max: 142/41    Heart Rate: 88  Pulse  Avg: 97  Min: 87  Max: 108    Respirations: 17  Resp  Avg: 15.5  Min: 14  Max: 17    SpO2: 97 %  SpO2  Avg: 98.1 %  Min: 97 %  Max: 100 %       No Data Recorded     Yesterday's intake and output:  02/18 0600 - 02/19 0559  In: 436 [P.O.:200; I.V.:206]  Out: 850 [Urine:850]    Physical Exam  GENERAL:  Thin appearing woman in NAD.  Pleasant and cooperative.     HEENT: EOMI. Oropharynx appears moist. No LAD.   CARDIOVASCULAR:  Tachycardic and irregular rhythm.   LUNGS: CTAB  ABDOMEN: Soft, non-distended. Normoactive bowel sounds. Non-tender  EXTREMITIES: Warm and well perfused.   Mild asterixis     Labs (reviewed and notable for):    WBC/HGB/HCT/PLT:  8.1/7.9/22.4/58 (02/19 0350)  BMP:  138/3.2/107/20/35/0.74/174 (02/19 0350)    Micro  GI pathogen panel 2/14 - norovirus   BCx 2/14 - NGTD    Radiology/Imaging/Procedures:   NO new.     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     #Afib: Initially in RVR but HR now controlled on diltiazem.   -continue diltiazem   -Previous team had extensive discussion with patient and family regarding risk/benefits of anticoagulation and family wanted to pursue [facility] with xarelto however will defer beginning ac at this time given patient is at a high fall risk in setting of acute encephalopathy. Patient and family expressed agreement with this plan and discussion regarding ac will be revisited once patient's mentation improves.     #norovirus:  diarrhea resolved.   --Hydration, encourage oral intake, and also contact precautions    #HCV cirrhosis: status post TIPS (06/2014) with recent revision no 09/14/14. Abdominal ultrasound on admission shows patency of vessels.   --Hepatic encephalopathy: continue lactulose/rifaximin, IR plan to downsize TIPS on Tuesday; NPO Monday night.   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   --Ascites: restart lasix 20/aldactone 50. S/p TIPS   --HCC: CT 06/2014 without lesions c/f HCC, repeat due in May.     #DM2: not well controlled   --will increase lantus to 30units and 9units lispro qac     #Osteoporosis  --Continue home calcium and vitamin D    #GERD:   --Continue pepcid 20 mg daily    VTE PPx: SQ heparin   CODE : Full Code  DISPO: Pending clinical improvement.

## 2014-09-29 NOTE — Plan of Care (Signed)
Endorsed Pt to day nurse [facility] regarding present status including: serious condition, VS, I&O, Pain Score, IV Fluids, and medications. Future plans for the pt were also discussed like: lactulose therapy, aspiration and fall precautions, still on contact precautions for Norovirus.

## 2014-09-29 NOTE — Interdisciplinary (Signed)
Speech Language Pathology Evaluation    Assessment   Assessment : Pt. presents with mild-moderate dysphagia in context of generalized weakness and confusion (improving per family report) and NG tube, pt. reports R side pharyngeal discomfort from tube (NG vs keo 2/2 need for large med administration per RN report). Min s/sx laryngeal penetration with large straw gulps nectar thick liquid only, no overt s/sx aspiration and timely swallow with nectar tick liquid by tsp, thin liquids by tsp and puree. Recommend puree diet with nectar thick liquids by tsp (no straw), tsp sips thin water following oral care, strict aspiration precautions, feed only when awake/alert, assisted feed as indicated). Rec. crush meds in nectar thick liquid or puree if possible. ST to f/u with Tx and swallow reassessment as indicated, will likely benefit from swallow reassessment when NG removed or replaced with keofeeding tube. Burgess Amor discussed with RN and text paged to 1st call MD.     Evaluation Type: Swallow    Patient History   Treatment Start Time : 2956  Onset date (this episode): 10/07/2014 (DFamily report no dysphagia prior to admit date)  Patient history: Pt. 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 Hospital course complicated by afib with RVR. ST here for bedside swallow evaluation per MD order 2/2 failing water swallow screen with RN. RN reports coughing with liquids yesterday, confusion and mental status fluctuates per RN and family report. Family deny any h/o dysphagia prior to this admit. ST arrived with [facility], stated preference for use of MAARTI however family deferred its use, state preference to translate directly for pt. Pt. able to follow simple commands in Spanish and participate in evaluation tasks with demo, family translation utilized  further as needed.   Prior level of function: No deficits  Has assistance: Yes  Living situation: Lives with family;Married  Type of evaluation: 92610 Evaluation of oral & pharyngeal swallowing function  Equipment Present: NG/Keo feed tube;IV  Pain Rating (0-10): 0  Pain Location: denied, reports discomfort with swallow 2/2 NG tube    Oral Motor Evaluation    Oral Motor Evaluation  Facial Muscles: Intact bilaterally  Dentition: Poor dentition  Buccal cavity: Normal  Lips: Normal  Tongue movement: Decreased lingual strength  Jaw movement: Normal  Hard palate : Normal  Soft palate: Elevates symmetrically  Velar Movement with /a/: Elevates symmetrically  Gag Reflex: Unable to evaluate/assess  Vocal quality: Normal (at baseline per family report)        Clinical Bedside Swallow Evaluation    Clinical Swallow Evaluation  Dysphagia Symptoms : Coughing/choking on liquids;Other( comments) (per RN report)  Current diet: NG tube feeds  Oral Secretions: Normal;Other (comments) (mildly dry, imporved with oral care )  Respirations: Normal  Food/liquid administered during evaluation: Water;Thickened liquid;Pureed food  Lip seal: Within normal limits  Oral Prep : Intact  Mastication: Increased mastication time  Swallow Initiation: Intact  Hyolaryngeal elevation: Reduced (min reduced)  Oral stasis: No  Swallow efficiency: Multiple swallows/bolus;Other (comments) (with straw gulp nectar thick water)  Duration of deglutition: Increased (min increased)  Problems: Signs/symptoms of laryngeal penetration;Coughing/choking on liquids;Pain on swallowing;Poor mastication  Clinical Exam Recommendations: Initiate Swallow therapy;Modify PO diet consistency  Diet recommendations: Pureed diet PO;Thickened liquids PO (crush meds, sips (tsp) thin water with RN between meals only)  Functional Limitation Reporting   G-Codes  Functional Limitations: Swallowing  Swallow Current Status (Y5749): CK 40%-59% impaired, limited or restricted  Swallow  Goal Status (T5521): CI 1%-19% impaired, limited or restricted     Plan  The goals listed below are achievable and realistic within the designated time frame and the treatments listed below, and referred to in the treatment plan, are medically necessary to achieve these goals. The functional goals were created based on the patient's prior level of function.    Goals                                                                                        Long term goals  Swallow: Swallow: Pt will tolerate least restrictive oral diet without e/o laryngeal aspiration  Visits: 6   Short term goals (Swallow)   Goal: Pt will tolerate pureed diet with thickened liquids without symptoms of laryngeal penetration/aspiration  Visits : 2  Status : New  Goal: Pt will utilize compensatory strategies independently  Visits : 4  Status : New  Goal: Pt will tolerate ice chips without symptoms of laryngeal penetration/aspiration  Visits : 2  Status : New                       Patient stated goal: water, more fruit       Treatment Plan  Treatment Plan: 92526  Tx of swallowing dysfunction and/or oral function for feeding  Frequency: 1-3 days/wk     Treatment Today  92526  Tx of swallow dysfunction and/or oral function for feeding: PO trials;Pt education/home exercises;Patient/family education;Effortful swallow;Puree;Nectar thick liquid;Thin liquid;Oral care  Total TIMED Treatment (min) : 0     Objective : PO trials: thin liquids (tsp x6), nectar thick liquids (tsp x7, straw sip x1), puree by tsp x7, all assisted feed. Oral stage of swallow is [facility] despite pt. mildly edentulous. Swallow trigger is timely, laryngeal elevation mildly reduced, multiple swallows with straw gulp nectar only followed by throat clears. ST demo'd and cued effortful swallow, no further s/sx laryngeal penetration or aspiration across tsp trials, RR and vocal quality stable. Pt. does complain of intermittent discomfort during swallow vs. odynophagia is R pharynx 2/2  presence of NG tube. ST cues alternating bites and sips, pt. subjectively denies pharyngeal PO globus and reports improvement in discomfort with use of this strategy. Family reported pt.'s verbalizations to be appropriate to the context of the evaluation (ex: answered questions regarding orientation, pain level, sensation on swallow, requested more PO).  Swallow strategies reviewed with pt and family who verbalized understanding, family questions addressed.   Rehab Potential: Good   Discussion and Agreement: Patient and family;MD/RN    Total Treatment Time (min): 10 (evaluation then 10 min Tx with pt and family education)  Total TIMED Treatment (min) : 0

## 2014-09-29 NOTE — Plan of Care (Signed)
Pt assessment and neuro checks remain the same. Vss. No acute distress noted. Paged md with speech therapy recs for pureed diet with nectar thick liq via teaspoon. No order at this time. Fall risk protocol remains in place. Cont to turn q2hrs. Denies any pain. No other needs noted. Will cont to monitor.

## 2014-09-29 NOTE — Interdisciplinary (Signed)
PHYSICAL THERAPY EVALUATION NOTE    Discharge Information    PHYSICAL THERAPY DISCHARGE RECOMMENDATIONS  Discharge Physical Therapy and equipment needs: Patient will benefit from continued skilled Physical Therapy  Patient is appropriate for discharge to: a supervised living situation  Patient has the following deficits that may restrict safe completion of mobility activities of daily living skills: physical limitations  Patient functional limitations necessitate consideration of the following assistive device to complete mobility activities of daily living safely: [place]    Admission Information    Reasons for Admission: Paula Manning is a 24F with HCV (GT2, SVR), cirrhosis (c/b ascites, HE, EV) 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 Patient is stable currently with improved mentation but still have poor PO intake. Hospital course complicated by afib with RVR.   Medical History: Complex PMH, see H&P for details.   Reasons for Therapy: Difficulty with mobility  Mobility: Independent with mobility using assistive device (used SPC occasionally. )  Type of Home: House  Home Layout: One level  Living Arrangements: Spouse / significant other (husband)  Home Equipment: SPC  Occupations: Retired    Objective Assessment    Parameters:    Type of Eval: Evaluation  Additional  Precautions/Contrandications:: Fall Risk;Contact  RLE: No Precautions  LLE: No Precautions  Equipment Present: IV;Tele  Pain Score (0-10): 0  Pain Location: Pt denied pain throughout session today.     Exam:    Oral Exam: No deficits  Visual: no deficits  Auditory: WNL  Level of Consciousness: Alert  Orientation Level: Oriented to person;Oriented to time;Oriented to place  Safety: Fair  Follows Commands: Single Step  Neglect: None  Coordination: no deficits  Sensation: no deficits  Spasticity: none  Tone: normal  Upper Extremity  Comments: B shldr elevation AROM <150 degrees. B elbow, wrist, and finger strength and AROM WFL. See OT notes for details as OT consulted. Per son, pt has had issues with shldr elevation due to previous fx.     Mobility Assessment:    Rolling: min assist  Supine - Sit: mod assist  Sit - Supine: mod assist  Sit - Stand: mod assist  Stand - Sit: min assist  Chair Transfer: not able to participate  Ambulation: mod assist (sidestepping along EOB)  Feet Ambulated: 3  Description of Gait: with FWW, side-stepping only along EOB. Constant posterior lean, required constant manual assist to maintain midline and remain upright despite max verbal and tactile cues. Required 2 seated rest breaks to complete 7ft. Decreased step length and foot clearance. Also marched in place 2x20 with FWW, and mod A, manual assist to maintain standing balance as pt constantly leaning posteriorly.   Equipment Needed: FWW  Static Balance - Sitting: Fair  Static Balance - Standing: Poor  Dynamic Balance - Sitting: Poor  Dynamic Balance - Standing: Not able to participate  Activity Tolerance: fair     Lower Extremity Assessment       Lower Extremity Comments: RLE strength hip flex 4+/5, knee ext 4+/5, knee flex 4/5, ankle DF 4+/5. Attempted to continue with MMT on LLE, but pt had increasing difficulty maintaining sitting balance at EOB. Plan to assess as needed when pt has improved balance. LLE AROM WFL.   Upper Extremity Comments: B shldr elevation AROM <150 degrees. B elbow, wrist, and finger strength and AROM WFL. See OT notes for details as OT consulted.  Per son, pt has had issues with shldr elevation due to previous fx.     Treatment Today  Type of Eval: Evaluation  Therapeutic Activities [97530]: x2          Treatment Plan  The goals listed below are achievable and realistic within the designated time frame and the treatments listed below, and referred to in the treatment plan, are necessary to achieve these goals. The functional goals were  created based on the patient's prior level of function.    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 Patient is stable currently with improved mentation but still have poor PO intake. Hospital course complicated by afib with RVR. Prior to admission, pt was living with her husband and was independent with daily activities and functional mobility occasionally using a SPC. Pt currently presents with decreased functional ability and requires mostly mod A with bed mobility, transfers, and side-stepping along EOB. Pt had fair to poor sitting and standing balance and required constant guarding and manual assist due to tendency to lean posteriorly. Pt is a fall risk due to poor balance in sitting and standing. Plan to continue training OOB activities to facilitate return to prior level of function. Pt would benefit from ongoing skilled PT to train strength, balance, activity tolerance, safety awareness, and mobility strategies to maximize functional ability.     Problems  Problems: Functional mobility impairment;Activity tolerance impairment;Strength impairment;Safety impairment;Cognitive impairment    Functional Limitations  Functional Limitation   Current Impairment Category: T6226: Mobility  Current Impairment %: CK: 40-59% impaired, limited or restricted  Goal Impairment: J3354: Mobility  Goal Impairment %: CJ: 20-39% impaired, limited or restricted  Rationale: Clinical judgement    Goals     Goal #1: Bed mobility supine <> sit at EOB, MOD (I) so pt can sit up for functional tasks and prepare for transfers.   # Visits: 5-7  Progress: New  Goal #2: Transfer bed <> chair with LRAD, CGA so pt can safely participate in OOB activities with family assistance.   #  Visits: 7-10  Progress: New  Goal #3: Ambulate 132ft with LRAD, CGA so pt can safely ambulate household distances with family assistance.   # Visits: 7-10  Progress: New    Recommendations    PHYSICAL THERAPY PATIENT DISCHARGE INSTRUCTIONS  Your Physical Therapist suggests the following: Continue to complete your home exercise program daily as instructed;Continue to utilize correct body mechanics when moving in and out of bed as instructed;Supervision with walking is suggested for increased safety;Continue to use your assistive device as instructed when walking to improve your stability and prevent falls  The following assistive device has been recommended for your safety: [place]     Patient to continue therapy:   Frequency: 4-7 times/wk  Focus for Next Treatment: Gait training;Functional mobility training;Balance activities;Graded tasks to increase function;Safety instruction;Therapeutic exercise    Total Treatment Time (min): 45  Total Timed Treatment (min) : 30

## 2014-09-29 NOTE — Consults (Signed)
Endocrine Consult Note    Consulting Physician: Van Clines, MD    Reason for Consult: provide opinion regarding treatment options for diabetes      HPI:  Paula Manning is a 74 year old female with uncontrolled DM2 c/b neuropathy as well as HepC cirrhosis, HTN, glaucoma and migraines admitted 09/28/2014 w/ n/v and AMS. Asked by Dr. Wende Crease and Team to provide opinion regarding treatment options for diabetes management.    Pt known to our service from previous hospitalization, pt and her husband report h/o DM2 x 20 years c/b neuropathy, denies other complications including nephropathy, retinopathy, MI or CVA. Home regimen is Lantus 20 units qhs with the pen, glipizide 2.5 mg bid and Januvia 50 mg qday. She has a meter and checks her BG 2x/daily w/ readings typically 200-400's, 1x was 150 mg/dL.  No lows.    She drinks water when thirsty, but has been drinking OJ daily, also eats a lot of rice and beans.    Followed by Dr. [place] office in [place]. Lives with her husband and relatives close by.    ROS: no f/c, weight gain/loss, chest pain/palpitations, SOB improved, no cough, abd pain, n/v, diarrhea/const, BRBPR/melena, dysuria/hematuria, orthopnea/PND/LEE. No polyuria, polydipsia or blurry vision. stable numbness, tingling, no pain or new lesions B feet.    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    Type II or unspecified type diabetes mellitus with peripheral circulatory disorders, not stated as uncontrolled(250.70) 06/10/2007    HTN  06/10/2007    HTN 06/10/2007       PSHx:  Past Surgical History   Procedure Laterality Date    Pb laps surg cholecstc w/expl common duct  1970s     in [place];  open CCX    Pb cesarean delivery only      Pb cstoplasty/cstourtp plstc any       Bladder suspension    Egd procedure  06/01/2013     Procedure: GI EGD;  Surgeon: Jeanette Caprice, MD;  Location: [facility];  Service: Gastroenterology;;    Tips procedure         SocHx:  Denies t/e/d    FamHx:  Denies DM    ALL: Review of patient's allergies indicates no known allergies.    MEDS:  Current Facility-Administered Medications   Medication    cholecalciferol (VITAMIN D) tablet 400 Units    dextrose 50 % solution 12.5 g    diltiazem (CARDIZEM) tablet 30 mg    famotidine (PEPCID) tablet 20 mg    furosemide (LASIX) tablet 20 mg    glucagon (GLUCAGON) injection 1 mg    glucose chewable tablet 16 g    glucose oral gel 1 Tube    [START ON 09/30/2014] insulin glargine (LANTUS) injection 30 Units    insulin lispro (HUMALOG) injection 1-10 Units    insulin lispro (HUMALOG) injection 9 Units  lactulose solution 20 g    meclizine (ANTIVERT) tablet 25 mg    multivitamin tablet 1 tablet    nalOXone (NARCAN) injection 0.1 mg    prochlorperazine (COMPAZINE) injection 10 mg    rifaximin (XIFAXAN) tablet 550 mg    sodium chloride (PF) 0.9 % flush 3 mL    sodium chloride (PF) 0.9 % flush 3 mL    sodium chloride 0.9 % TKO infusion    spironolactone (ALDACTONE) tablet 50 mg       PHYSICAL EXAM  Temperature:  [97.7 F (36.5 C)-98.8 F (37.1 C)] 97.7 F (36.5 C) (02/19 1600)  Blood pressure (BP): (112-135)/(35-72) 118/47 mmHg (02/19 1600)  Heart Rate:  [87-104] 88 (02/19 1600)  Respirations:  [14-17] 15 (02/19 1600)  Pain Score:  [-] Patient Sleeping, Respiratory Assessment Done (02/19 1400)  O2 Device:  [-] None (Room air) (02/19 1400)  SpO2:  [97 %-100 %] 98 % (02/19 1600)  Body mass index is 21.63 kg/(m^2).    GEN: sleepy  HENT: NC/AT, OP  clear  Eyes: EOMI, no periorbial edema, chemosis or lid-lag  NECK: supple  CV: RRR  RESP: breathing comfortably  ABD: soft, distended  EXT: tr edema  NEURO: no tremor    LABS:  Lab Results   Component Value Date    A1C 6.2 10/05/2014    A1C 8.1 06/20/2014    A1C 7.4 11/02/2013     Lab Results   Component Value Date    NA 138 09/29/2014    K 3.2 09/29/2014    CL 107 09/29/2014    BICARB 20 09/29/2014    BUN 35 09/29/2014    CREAT 0.74 09/29/2014    GLU 174 09/29/2014    CA 9.1 09/29/2014     Lab Results   Component Value Date    CHOL 156 12/28/2010    HDL 59 12/28/2010    LDLCALC 78 12/28/2010    TRIG 95 12/28/2010     Lab Results   Component Value Date    AST 23 09/29/2014    ALT 17 09/29/2014    GGT 12 10/16/2004    LDH 351 12/27/2010    ALK 54 09/29/2014    TP 5.3 09/29/2014    ALB 3.6 09/29/2014    TBILI 3.73 09/29/2014    DBILI 1.3 09/25/2014       Diet: carb lim pureed w/ glucerna TID    Blood Sugars reviewed:      AM Lu Di HS O/N    2/18  381 402 326 229  Lantus   25  Lispro  +6 +6 5+6 +2    2/19  190 215  Lantus   25  Lispro  +2 +3    A/P:  Paula Manning is a 74 year old female with uncontrolled DM2 c/b neuropathy as well as HepC cirrhosis, HTN, glaucoma and migraines admitted 09/27/2014 w/ n/v and AMS.     Inpatient Recs:  - agree w/ inc Lantus to 30 units qday  - agree w/ inc Lispro to 9 units qac, RN to give 0, 1/2, whole dose based on % tray consumed  - agree w/ mod dose Lispro correction scale for now    Outpatient Recs:  Was on Lantus 20 units qhs, Januvia 50 mg qday and glipizide 2.5 mg bid w/ A1C 6.2%, but home BG 200-400s in setting of OJ-carrot juice and ++rice/beans intake.  Final doses TBD based on inpatient needs.    Thank you for  this interesting consult, will cont to follow      Mellody Memos, MD  Assistant Clinical Professor of Medicine   Division of Endocrinology, Diabetes and Metabolism  [facility]

## 2014-09-29 NOTE — Interdisciplinary (Signed)
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 Patient is stable currently with improved mentation but still have poor PO intake. Hospital course complicated by afib with RVR.   Reasons for Therapy: Difficulty with ADL's  ADL: Needs assist with ADLs/functional mobility (pt's husband was assisting with ADL's)  Type of Home: House  Home Layout: One level  Living Arrangements: Married;Spouse / significant other  Bathroom: Tub/shower (pt using a regular chair in the shower)  Home Equipment: SPC;4-wheeled walker    Objective Assessment    Parameters:    Type of Eval: Evaluation 97003  RUE: No Precautions  LUE: No Precautions  RLE: No Precautions  LLE: No Precautions  Additional  Precautions/Contrandications:: Fall Risk  Equipment Present: IV;Tele  Pain Location: no reports of pain    Exam and Neuro Assessment:    Oral Exam: no deficits  Visual: no deficits  Auditory: no deficits  Level of Consciousness: alert  Orientation Level: person;time;place  Safety: fair  Follows Commands: single step  Neglect: none  Coordination: No deficits  Sensation: No deficits  Spasticity: None    ADL and Mobility Assessment:    Feeding: not able to participate (NPO)  Grooming/Hygiene: min assist  Upper Body Dressing: mod assist  Lower Body Dressing: dependent  Toileting: dependent  Bathing: not able to participate  Bed Mobility: mod assist  Toilet Transfer: not able to participate  Tub/Shower Transfer: not able to participate  Sit - Stand : not able to participate  Sitting Balance: Fair+  ADL Comments: Pt requires assistance with ADL's mostly at bed level. Pt has UE function to participate in grooming/hygiene and UB dressing although per pt her husband has been assisting with  LB dressing. Pt compelting toileting in bed with assistance.  Endurance: poor    Upper Extremity Assessment    Splints/Positioning Devices: None indicated   Upper Extremity Comments: RUE ROM and strength WFL for ADL's. LUE ROM limited in shoulder flexion to about 100 degrees with some weakness grossly 4/5 strength. Pt's family states she had a previous fracture about 3 years ago. R elbow, wrist, digits WFL.  Lower Extremity Comments: PT on consult     Treatment Today    Type of Eval: Evaluation 97003  Therapeutic Activities [97530]: x2            Treatment Plan  The goals listed below are achievable and realistic within the designated time frame and the treatments listed below, and referred to in the treatment plan, are necessary to achieve these goals. The functional goals were created based on the patient's prior level of function.    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 Patient is stable currently with improved mentation but still have poor PO intake. Hospital course complicated by afib with RVR. OT consulted to assess ADL's. Pt lives with her husband who assists with her ADL's at home including showering and LB dressing. Today pt was alert and oriented to self, location  and time. Pt required mod A from supine to the edge of bed with fair+ sitting balance. Pt required assistance with socks which her husband usually helps with. Pt sit to stand with FWW with min A although deferred further OOB activity/ADL's secondary to fatigue. Pt returned to bed with min A, max Ax2 to readjust higher in the bed. Pt with family present and RN aware of status. Pt appears to be functioning below her baseline for ADL's due to decreased activity tolerance and will be  seen while inpatient to maximize I. Currently recommending further rehab upon discharge prior to returning home with family assistance/support.    Problems  Problems: ADL impairment;Activity tolerance impairment    Functional Limitations  Functional Limitation   Current Impairment Category: G8185: Self Care  Current Impairment %: CL: 60-79% impaired, limited or restricted  Goal Impairment: U3149: Self Care  Goal Impairment  %: CJ: 20-39% impaired, limited or restricted  Rationale: Clinical judgement    Goals  Patient Stated Goal: None stated  Goal #1: Pt will complete UB dressing with mod I.  # Visits: 1-3  Progress: New  Goal #2: Pt will complete grooming/hygiene at the sink with supervision.  # Visits: 3-5  Progress: New  Goal #3: Pt will complete toileting OOB to the commode with supervision.  # Visits: 5-7  Progress: New    Recommendations  Equipment Recommendations: to be determined as patient progresses with therapy;shower chair;3-in-1 commode    Patient to continue therapy:   Frequency: 4-7 times/wk       Total Treatment Time (min): 45  Total Timed Treatment (min) : 30

## 2014-09-29 NOTE — Interdisciplinary (Signed)
ST attempted to see pt 2/2 receiving swallow eval consult, MD team rounding at bedside, ST will return as pt. appropriate.

## 2014-09-29 NOTE — Plan of Care (Signed)
Pt assessment and neuro checks remain the same. Vss. No acute distress noted. Pt sleeping but easily arroused. Cont to turn q2hrs. Fall risk protocol remains in place. Family remains at bedside. No other needs noted. Will cont to monitor.

## 2014-09-29 NOTE — Progress Notes (Signed)
Hepatology Progress Note  Consult Attending: Dr. Andreas Ohm, MD  Consult Fellow:  Gerrit Heck, MD  Current Attending: Dannette Barbara, MD   Date: 09/29/2014      24h events: per family pt almost back to her baseline, no acute events    Past Medical History:  Past Medical History   Diagnosis Date    Migraine headache     Glaucoma     Ascites 06/29/2007     History of paracentesis x 2    Pancreatic cyst 06/29/2007    Cirrhosis ([place]) 06/24/2007     Active on the liver transplant waiting list with a blood type of O positive. 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 Due to Hep C    Osteopenia 06/24/2007    Emphysema 06/24/2007    EV (esophageal varices) ([place]) 06/10/2007    Anemia 06/10/2007    HCV (hepatitis C virus) 06/10/2007     Achieved SVR.    Type II or unspecified type diabetes mellitus with peripheral circulatory disorders, not stated as uncontrolled(250.70) 06/10/2007    HTN 06/10/2007    HTN 06/10/2007       Past Surgical History:   Past Surgical History   Procedure Laterality Date    Pb laps surg cholecstc w/expl common duct  1970s     in [place];  open CCX    Pb cesarean delivery only      Pb cstoplasty/cstourtp plstc any       Bladder suspension    Egd procedure  06/01/2013     Procedure: GI EGD;  Surgeon: Jeanette Caprice, MD;  Location: [facility];  Service: Gastroenterology;;    Tips procedure         Allergies:  No Known Allergies    Home Medications:  Prescriptions prior to admission   Medication Sig Dispense Refill Last Dose    alendronate (FOSAMAX) 70 MG tablet    09/22/2014    ALPHAGAN P 0.1 % ophthalmic solution    09/22/2014    bacitracin 500 UNIT/GM ointment Apply small amount to affected area on face twice daily 1 Tube 1 09/22/2014    CALCIUM CARBONATE 600 MG OR TABS 1 tablet 2 times daily one month 11 09/22/2014    ciprofloxacin (CIPRO) 500 MG tablet Take 1 tablet by mouth daily. 30 tablet 5 09/22/2014    furosemide (LASIX) 20 MG tablet Take 2 tablets by  mouth daily. 90 tablet 0 09/22/2014    glipiZIDE (GLUCOTROL) 5 MG tablet Take 0.5 tablets (2.5 mg) by mouth 2 times daily. 30 tablet 1 09/22/2014    insulin glargine (LANTUS) 100 UNIT/ML injection Inject 20 Units under the skin daily. 10 mL 1 09/22/2014    lactulose 10 GM/15ML solution Take 30 mLs (20 g) by mouth 3 times daily. 946 mL 0 09/22/2014    LUMIGAN 0.01 %    09/22/2014    multivitamin (MULTI-VITAMIN) tablet Take 1 tablet by mouth daily.   09/22/2014    ranitidine (ZANTAC) 300 MG tablet Take 300 mg by mouth 2 times daily.   09/22/2014    rifaximin (XIFAXAN) 550 MG tablet Take 550 mg by mouth every 12 hours.   09/22/2014    sitagliptin (JANUVIA) 50 MG tablet Take 1 tablet (50 mg) by mouth daily. 60 tablet 1 09/22/2014    spironolactone (ALDACTONE) 50 MG tablet Take 1 tablet (50 mg) by mouth daily. 30 tablet 0 09/22/2014    VITAMIN D 400 UNIT OR CAPS 1 CAPSULE DAILY 30 11 09/22/2014  Inpatient Medications:  Scheduled Meds   cholecalciferol  400 Units Daily    diltiazem  30 mg Q6H    famotidine  20 mg Daily    furosemide  20 mg Daily    insulin glargine  25 Units Daily    insulin lispro  1-10 Units 4x Daily WC    insulin lispro  7 Units TID WC    lactulose  20 g BID    multivitamin  1 tablet Daily    rifaximin  550 mg Q12H    sodium chloride (PF)  3 mL Q8H    spironolactone  50 mg Daily     IV Meds   sodium chloride       Social History:   Lives with husband. Has 10 children. Was a homemaker.  Denies alcohol, drugs, tobacco.     Review of Systems: -   Constitutional: Denies fevers, chills, weight loss  ENT: negative for eye changes, oral lesions, difficulty swallowing  Cardiac: Denies chest pain or palpitations  Lungs: Denies cough or shortness of breath  GI: Negative except noted in the HPI  GU: Denies dysuria, hematuria  Skin: Denies rashes or other skin lesions  MSK: Denies joint deformities, new joint pain  Lymph: Denies enlarged nodes, night sweats, abnormal bleeding  Psych: negative for  depressed mood, anxiety  Neuro: negative for confusion, weakness, sensory changes    OBJECTIVE:   Vitals signs:   Latest Entry  Range (last 24 hours)    Temperature: 97.8 F (36.6 C)  Temp  Avg: 98.5 F (36.9 C)  Min: 97.8 F (36.6 C)  Max: 99.1 F (37.3 C)    Blood pressure (BP): 122/53 mmHg  BP  Min: 112/42  Max: 142/41    Heart Rate: 93  Pulse  Avg: 98.6  Min: 87  Max: 109    Respirations: 14  Resp  Avg: 15.3  Min: 14  Max: 17    SpO2: 100 %  SpO2  Avg: 98.1 %  Min: 97 %  Max: 100 %       No Data Recorded     Weight - scale: 51.9 kg (114 lb 6.7 oz)  Percentage Weight Change (%): 2.77 %    Intake/Output       09/28/14 0600 - 09/29/14 0559 09/29/14 0600 - 09/30/14 0559      1610-9604 1800-0559 Total 0600-1759 1800-0559 Total       Intake    P.O.  200  -- 200  --  -- --    I.V.  103  103 206  100  -- 100    NG/GT  --  30 30  120  -- 120    Total Intake 303 133 436 220 -- 220       Output    Urine  --  850 850  --  -- --    Emesis/NG Output  --  0 0  0  -- 0    Total Output -- 850 850 0 -- 0       Net I/O     303 -717 -414 220 -- 220        Respiratory support:  Oxygen Therapy  SpO2: 100 %  O2 Device: None (Room air)  O2 Flow Rate (L/min): 2 l/min     Physical Exam:  General:  Well developed, thin elderly Hispanic female in no apparent distress, somnolent, a&o to person and place (never oriented to date per son)  HEENT:  No  scleral icterus, Trachea midline, mucus membranes moist.  Lungs:  Clear to auscultation laterally. Normal respiratory effort.  Cardiovascular:  Regular rate and rhythm  Abdomen:  Abdomen soft, no tender to palpation, bowel sound present. No peritoneal signs.  Extremities:  Trace peripheral edema RLE > LLE. Warm well-perfused.  Skin:  Non-icteric and no visible rash  Neuro:  Cranial nerves II-XII grossly intact. Mild asterixis  Psych:  Normal affect.    Labs:     CBC  Recent Labs      09/27/14   0605  09/28/14   0551  09/29/14   0350   WBC  8.2  7.8  8.1   HGB  9.2*  8.6*  7.9*   HCT  27.3*   24.1*  22.4*   PLT  67*  63*  58*   BAND  3   --    --    SEG  84*  85*  85*   LYMPHS  9*  7*  7*   MONOS  3*  8  8      Chemistry  Recent Labs      09/28/14   0551  09/29/14   0350   NA  135*  138   K  3.4*  3.2*   CL  103  107   BICARB  18*  20*   BUN  38*  35*   CREAT  0.87  0.74   GLU  366*  174*   [place]  9.3  9.1   MG  2.7*  2.6   PHOS  2.2*  1.6*     Recent Labs      09/29/14   0350   ALK  54   AST  23   ALT  17   TBILI  3.73*   ALB  3.6        Coags  Recent Labs      09/27/14   1000  09/29/14   0350   PT   --   22.6*   PTT  43.5*   --    INR   --   2.1       Radiology:   CT A/P w/ contrast 09/25/14: FINDINGS:  LUNG BASES: Small to moderate right pleural effusion with right lower lobe   atelectasis  LIVER/BILIARY:Diffuse nodularity and hepatic atrophy. Stable scattered tiny   low attenuation lesions in the liver.  PANCREAS: Stable numerous pancreatic cysts.  SPLEEN: Unremarkable.  ADRENAL GLANDS: Unremarkable  KIDNEYS: Stable left renal cyst.  STOMACH/DUODENUM: Mild wall thickening from the distal stomach to the proximal  jejunum  VASCULATURE: Patent TIPS. Stable nonocclusive thrombus of the main portal vein  and superior mesenteric vein. New thrombosis of the left portal vein.  LYMPHATIC: Unremarkable  SMALL & LARGE BOWEL: Unremarkable  BLADDER/PELVIC ORGANS: Unremarkable  BONES/SOFT TISSUES: Unremarkable  OTHER: Trace pelvic ascites    IMPRESSION:  1) New thrombosis of the left portal vein. Vessel was patent on prior   abdominal ultrasound 09/29/2014  2) Edema of the distal stomach through proximal jejunum suggestive of   enteritis, nonspecific    CT head 09/11/2014: IMPRESSION:  1. No evidence of acute intracranial hemorrhage or findings to suggest   territorial/transcortical infarct.  2. Mild white matter changes. These may be associated with prior small vessel   ischemia. If there is concern for encephalopathy, MRI would be more helpful   for acute evaluation. MRI screening: No intracranial or intraorbital  metal.  3. Frothy secretions  within the right sphenoid sinuses well as reactive bone   changes, suggestive of acute on chronic sinusitis.    RUQ U/S 10/05/2014: FINDINGS:  The liver measures 11.3 cm in long axis. It is normal in echogenicity. No   focal lesions are identified. There is no intra or extra hepatic bile duct   dilation. The common bile duct measures four mm. The gallbladder is not   visualized. A patent TIPS catheter is present. The TIPS velocities are: Portal  end 115 ; mid 93 ; hepatic 125 cm/s. Flow in the right and left portal veins   is hepatofugal and towards the TIPS ; flow in the main portal vein is   hepatopetal.  The right kidney measures nine cm in long axis and is within normal limits   with no hydronephrosis or renal calculi.  The visualized portion of the pancreas again demonstrates multiple cystic   lesions throughout the pancreas, as seen on prior imaging  The visualized aorta and inferior vena cava are within normal limits.  Right pleural effusion. No evidence of intra abdominal ascites.    IMPRESSION:  Patent TIPS catheter with normal velocities and appropriate flow in portal   veins.  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      Prior Endoscopy:   EGD 05/2013:  Findings: 1. Midline orophyarngeal prominence at the  arytenoid  2. Three columnns of grade 1 esophageal varices, no high  risk features  3. No gastric varices, mild portal hypertensive gastropathy  4. Few punctate clots in the antrum  5. Mild duodenal congestion consistent with portal  hypertension  Endoscopic Diagnosis: 1. Midline orophyarngeal  prominence at the arytenoid  2. Three columnns of grade 1 esophageal varices, no high  risk features  3. No gastric varices, mild portal hypertensive gastropathy  4. Few punctate clots in the antrum  5. 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 Continue propranolol  10 BID  2. ENT consultation to evaluate oropharyngeal findings  3. 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 Confusion associated with poor medication compliance, including lactulose, N/V, abd pain, diarrhea, and f/c/s. HDS. Work-up here showed GI pathogen panel with norovirus.     #)Norovirus: likely responsible for N/V, abd pain and diarrhea  - CT A/P w/out acute pathology  - Follow up blood cultures: NGTD  - Supportive care with IVF and anti-emetics  - Would give 25% 25g albumin BID for albumin less than 3    #)Afib w rvr  S/p diltiazem, now rate controlled. CHADS score 2, chads vsc 4. High risk of fall due to her hepatic encephalopathy, highly concerned for the possibility of anticoagulation. We would hold anticoagulation at this time. Patient also will be evaluated from IR for possible downsizing her transjugular intrahepatic portosystemic shunt, as her MS not yet back to her baseline.  - hold anticoagulation    #)HCV GT2 cirrhosis c/b ascites, HE and diuretic resistant HHT now s/p TIPS 06/28/2014 and TIPS revision 09/14/14 MELD 17  -  HE: still encephalopathic but improved, likely related to infection and TIPS revision,    - Continue lactulose for 3-4 soft bowel movements a day (decrease frequence to bid), and rifaximin  - Ascites: No ascites on recent U/S, continue to hold her diuretics  - EV's: S/p TIPS, no longer needs surveillance if TIPS is patent and she is stable  - HHT: S/p TIPS and TIPS revision   - RUQ U/S 2/13 with patent TIPS   - diuretics lasix 20, aldactone 50  - Non-occlusive portal vein thrombosis: Multiple outpatient notes indicate  poor medication adherence/knowledge, also very elderly, so no a/c  - HCC surveillance: Last CT on 06/27/2014 showed no HCC. Repeat imaging in May 2016.  - Transplant: Age >70, not a candidate  - ECHO: EF 72%, PA pressure 34  - Check LFT's  - IR consult: downsizing her transjugular intrahepatic portosystemic shunt: scheduled for Tuesday       DWA Dr. Carollee Herter, MD  GI fellow

## 2014-09-29 NOTE — Plan of Care (Signed)
Problem: Discharge Planning  Goal: Participation in care planning  Outcome: Not Met  Pt confused, possible TIPS next week, NPO, still on Lactulose therapy  Goal: Verbalizes/demonstrates knowledge of discharge instructions  Outcome: Not Met  Pt still confused  Goal: Patient is at risk for readmission- risk factors have been addressed  Outcome: Not Met  Pt on NPO for possible TIPS for long term plan.    Problem: Falls, Risk of  Goal: Keep patient free from falls utilizing universal fall precautions  Outcome: Met  I: evaluated decision making ability/level of cognition and competence drowsy but easily arousable, oriented x2. assessed muscle strength and coordination with generalized weakness. reviewed medication regimen and how it affects patients. placed call light within reach and encouraged to call for assistance. frequent nurse checks to assess for safety. bed alarm on as needed and bed placed in low position, 2 SR raised and prompt response to call.  O: no fall or injury at this time.     Goal: Ensure safe mobility of patient (Mobility)  Outcome: Not Met  Pt on bedrest  Goal: Minimize the risk of falls due to the effects of prescribed medications (Medications)  Outcome: Met    Problem: Tissue Perfusion, Cardiopulmonary - Altered  Goal: Hemodynamic stability  Outcome: Met  Pt on persistent Atrial fibrillation as baseline. No desaturation on RA  Goal: Early recognition of deterioration  Outcome: Met    Problem: Pain - Acute  Goal: Communication of presence of pain  Outcome: Met  No signs of pain on CNPI  Goal: Control of acute pain  Outcome: Met    Problem: Alteration in Blood Glucose  Goal: Glucose level within specified parameters  Outcome: Not Met  FS @ 229, correction given as per protocol    Problem: Infection  Goal: Absence of infection signs and symptoms  Outcome: Not Met  Ongoing contact isolation for Norovirus on GI    Problem: Skin Integrity- Intact patient high risk for impaired skin integrity Braden  scale less than or equal to 18  Goal: Skin remains intact  Outcome: Met  Pt turned Q2H and prn, no pressure related skin issues noted. Bottom skin kept dry from incontinence and moisturized    Problem: Aspiration, Risk of  Goal: Evidence of swallowing difficulties is absent  Outcome: Not Met  Pt noted to be drowsy but easily arousable, noted to have occasional weak cough. NGT placed, X-ray taken, awaits confirmation by MD.

## 2014-09-29 NOTE — Plan of Care (Signed)
Problem: Discharge Planning  Goal: Participation in care planning  No d/c plan at this time. Plan for possible TIPS revision to be next week if needed. Pt lives with family but might need short SNF stay when medically ready for d/c. CM and SW following as needed. No other needs noted. Will cont to monitor.     Problem: Falls, Risk of  Goal: Keep patient free from falls utilizing universal fall precautions  Fall risk protocol remains in place. Family at bedside 24 hrs a day. Pt a&ox3 today and calm and cooperative. Pt does not try to get oob/pull at tubes. Bed remains in lowest position with side rails up x3 for safety. Bed locked and alarm on. Non skid socks in place. Environment monitored for safety and hourly rounds provided. Will cont to monitor.     Problem: Tissue Perfusion, Cardiopulmonary - Altered  Goal: Hemodynamic stability  Pt with controlled a-fib. All other VSS. Daily labs monitored. K and Phos replaced. Lungs clear to dim on room air. Plan for possible TIPS revision to be done next week if needed. Pt awake today, a&ox3.  Will cont to monitor closely.     Problem: Pain - Acute  Goal: Communication of presence of pain  Pt able to verbalize if in pain, appropriate scale used as needed. Denies any pain at this time. Will cont to monitor and reassess.     Problem: Alteration in Blood Glucose  Goal: Glucose level within specified parameters  Pts FS more controlled today. Has been NPO. Endocrine consult done and recs provided.  Plan for diet to be ordered for dinner and nutritional insulin to be given as needed.  Will cont to monitor and reassess.     Problem: Infection  Goal: Absence of infection signs and symptoms  Pt remains afebrile. VSS. Daily cbc followed. Will cont to monitor.     Problem: Skin Integrity- Intact patient high risk for impaired skin integrity Braden scale less than or equal to 18  Goal: Skin remains intact  Cont to turn pt q2hrs and monitored frequently for incont. Protective oint  applied to buttocks, remains CDI. No other needs noted. Will cont to monitor.     Problem: Aspiration, Risk of  Goal: Evidence of swallowing difficulties is absent  RN noted coughing with all fluids and pills. Made NPO and speech eval done, per speech ok for pureed w/ nectar thick liq and teaspoon liq only. Strict aspiration precautions followed. Will order dinner tray. Will cont to monitor.

## 2014-09-29 NOTE — Plan of Care (Signed)
Paged MD [phone] regarding Pt Paula Manning 1113, already has the NGT and X-ray confirmation was already taken 15 mins ago. Pls confirm the placement. Thanks Wright

## 2014-09-29 NOTE — Plan of Care (Signed)
Problem: Tissue Perfusion, Cardiopulmonary - Altered  Goal: Early recognition of deterioration  Outcome: Not Met  Paged MD [phone] regarding Pt Paula Manning 1113 has latest labs: serum K-3.2, and Phos-1.6 Thanks Pt still in afib, with occasional PVCs, one time Vtach of 6 beats, pt now more awake, oriented x2. Thanks [place]

## 2014-09-30 LAB — COMPREHENSIVE METABOLIC PANEL, BLOOD
ALT (SGPT): 19 U/L (ref 0–33)
AST (SGOT): 21 U/L (ref 0–32)
Albumin: 3.5 g/dL (ref 3.5–5.2)
Alkaline Phos: 75 U/L (ref 35–140)
Anion Gap: 12 mmol/L (ref 7–15)
BUN: 29 mg/dL — ABNORMAL HIGH (ref 6–20)
Bicarbonate: 20 mmol/L — ABNORMAL LOW (ref 22–29)
Bilirubin, Tot: 4.1 mg/dL — ABNORMAL HIGH (ref <1.20)
Calcium: 9.2 mg/dL (ref 8.5–10.6)
Chloride: 108 mmol/L — ABNORMAL HIGH (ref 98–107)
Creatinine: 0.65 mg/dL (ref 0.51–0.95)
GFR: >60 mL/min
Glucose: 214 mg/dL — ABNORMAL HIGH (ref 70–115)
Potassium: 3.5 mmol/L (ref 3.5–5.1)
Sodium: 140 mmol/L (ref 136–145)
Total Protein: 5.5 g/dL — ABNORMAL LOW (ref 6.0–8.0)

## 2014-09-30 LAB — CBC WITH DIFF, BLOOD
ANC-Automated: 9.1 10*3/uL — ABNORMAL HIGH (ref 1.6–7.0)
Abs Eosinophils: 0.1 10*3/uL (ref 0.1–0.7)
Abs Lymphs: 0.4 10*3/uL — ABNORMAL LOW (ref 0.8–3.1)
Abs Monos: 0.9 10*3/uL — ABNORMAL HIGH (ref 0.2–0.8)
Eosinophils: 1 % (ref 1–4)
Hct: 25.5 % — ABNORMAL LOW (ref 34.0–45.0)
Hgb: 8.7 gm/dL — ABNORMAL LOW (ref 11.2–15.7)
Lymphocytes: 4 % — ABNORMAL LOW (ref 19–53)
MCH: 33.5 pg — ABNORMAL HIGH (ref 26.0–32.0)
MCHC: 34.1 % (ref 32.0–36.0)
MCV: 98.1 um3 — ABNORMAL HIGH (ref 79.0–95.0)
MPV: 10 fL (ref 9.4–12.4)
Monocytes: 9 % (ref 5–12)
Plt Count: 75 10*3/uL — ABNORMAL LOW (ref 140–370)
RBC: 2.6 10*6/uL — ABNORMAL LOW (ref 3.90–5.20)
RDW: 21.4 % — ABNORMAL HIGH (ref 12.0–14.0)
Segs: 87 % — ABNORMAL HIGH (ref 34–71)
WBC: 10.5 10*3/uL — ABNORMAL HIGH (ref 4.0–10.0)

## 2014-09-30 LAB — GLUCOSE (POCT)
GLUCOSE (POCT): 316 mg/dL — ABNORMAL HIGH (ref 70–115)
GLUCOSE (POCT): 295 mg/dL — ABNORMAL HIGH (ref 70–115)
Glucose (POCT): 251 mg/dL — ABNORMAL HIGH (ref 70–115)
Glucose (POCT): 303 mg/dL — ABNORMAL HIGH (ref 70–115)

## 2014-09-30 LAB — MAGNESIUM, BLOOD: Magnesium: 2.3 mg/dL (ref 1.7–2.6)

## 2014-09-30 LAB — URINALYSIS
Bilirubin: NEGATIVE
Ketones: NEGATIVE
Nitrite: NEGATIVE
Specific Gravity: 1.017 (ref 1.002–1.030)
WBC: >50 — AB (ref 0–?)
pH: 6 (ref 5.0–8.0)

## 2014-09-30 LAB — BLOOD CULTURE
BLOOD CULTURE RESULT: NO GROWTH
BLOOD CULTURE RESULT: NO GROWTH

## 2014-09-30 LAB — PHOSPHORUS, BLOOD: Phosphorous: 2 mg/dL — ABNORMAL LOW (ref 2.7–4.5)

## 2014-09-30 LAB — PROTHROMBIN TIME, BLOOD
INR: 1.8
PT,Patient: 20 s — ABNORMAL HIGH (ref 9.7–12.5)

## 2014-09-30 LAB — C.DIFFICILE TOXIN PCR, STOOL: C.DIFFICILE TOXIN, PCR: NOT DETECTED

## 2014-09-30 MED ORDER — POTASSIUM & SODIUM PHOSPHATES 280-160-250 MG OR PACK
2.00 | PACK | Freq: Once | ORAL | Status: AC
Start: 2014-09-30 — End: 2014-09-30
  Administered 2014-09-30: 2 via ORAL
  Filled 2014-09-30: qty 2

## 2014-09-30 NOTE — Progress Notes (Signed)
HEPATOLOGY ATTENDING ATTESTATION  The patient was examined, the labs and test results were reviewed, and the case was discussed with the fellow/resident.    Mental status back to baseline according to her family. 6 bowel movements last 24 hours. Decrease frequency of lactulose. Resume diuretics. Will hold off on downsizing the transjugular intrahepatic portosystemic shunt as long as she continues to do well. Risks of downsizing it may outweigh the benefits at this point.

## 2014-09-30 NOTE — Plan of Care (Signed)
Problem: Discharge Planning  Goal: Participation in care planning  Outcome: Met  Pt able to participate in POC. No discharge orders at this time. Family remains supportive with POC. Will continue to monitor and provide ongoing discharge instructions as appropriate.     Problem: Falls, Risk of  Goal: Keep patient free from falls utilizing universal fall precautions  Outcome: Met  Patient's bed at lowest setting with side rails upx2. Patient's bed brakes on. Patient's call light and bedside table within reach; encouraged to call for assistance when needed. Patient's bed alarms on.  Patient's family member at beside at this time. Nonskid socks being worn.     Problem: Tissue Perfusion, Cardiopulmonary - Altered  Goal: Hemodynamic stability  Outcome: Met  Pt remains hemodynamically stable at this time. Will continue to monitor and provide care as appropriate.     Problem: Pain - Acute  Goal: Communication of presence of pain  Outcome: Met  Pt denies any pain at this time. Will continue to monitor and provide pain medication as appropriate.     Problem: Alteration in Blood Glucose  Goal: Glucose level within specified parameters  Outcome: Not Met  Pt's BS 250-300s, insulin coverage provided as ordered. Will continue to monitor and provide care as appropriate.     Problem: Infection  Goal: Absence of infection signs and symptoms  Outcome: Not Met  Pt with low grade fever 99.5 this afternoon. Will provide cooling measures. Remains on contact isolation for norovirus. Will continue to monitor.     Problem: Skin Integrity- Intact patient high risk for impaired skin integrity Braden scale less than or equal to 18  Goal: Skin remains intact  Outcome: Met  Patient's turned every two hours to prevent pressure related skin issues. Patient's cleaned up and dry. Protective ointment applied to bony prominences. Patient clear from pressure related skin issues. No redness noted. Will continue to monitor patient frequently. Sacral  mepilex replaced this am.     Problem: Aspiration, Risk of  Goal: Evidence of swallowing difficulties is absent  Outcome: Met  Pt currently 1:1 assist with feedings and currently receiving medications via NG tube. Will continue to monitor and provide care as appropriate.

## 2014-09-30 NOTE — Interdisciplinary (Signed)
Report given to Arnel, RN. Discussed POC including need for AFib regulation. Pt currently up in chair with family at bedside at this time. Will endorse care at this time.

## 2014-09-30 NOTE — Plan of Care (Signed)
Problem: Discharge Planning  Goal: Participation in care planning  Outcome: Met  No orders to discharge, continue to monitor    Problem: Tissue Perfusion, Cardiopulmonary - Altered  Goal: Hemodynamic stability  Outcome: Met  Conitnue 6hrs antiarhythmic medication. No complaints of chest pain or SOB    Problem: Pain - Acute  Goal: Communication of presence of pain  Outcome: Met  Patient able verbalize pain to staff. Denies pain at this time.         Problem: Alteration in Blood Glucose  Goal: Glucose level within specified parameters  Outcome: Not Met  Continue q AC/HS blood glucose check. Insulin cover per eMAR    Problem: Infection  Goal: Absence of infection signs and symptoms  Outcome: Met  WBC 8.1, afebrile at this time. Continue to monitor.     Problem: Skin Integrity- Intact patient high risk for impaired skin integrity Braden scale less than or equal to 18  Goal: Skin remains intact  Outcome: Met  Kept clean and dry, reposition Q2hrs.     Problem: Aspiration, Risk of  Goal: Evidence of swallowing difficulties is absent  Outcome: Met  Tolerating pureed diet and nectar thick liquids.

## 2014-09-30 NOTE — Progress Notes (Addendum)
Inpatient Medicine Progress Note       Interval Events/Subjective:   No overnight events. Had 6 BM over the last day. Restarted  On diuretics yesterday.       Vitals signs:     Latest Entry  Range (last 24 hours)    Temperature: 98.5 F (36.9 C)  Temp  Avg: 98.4 F (36.9 C)  Min: 97.7 F (36.5 C)  Max: 98.9 F (37.2 C)    Blood pressure (BP): 128/50 mmHg  BP  Min: 112/42  Max: 148/63    Heart Rate: 106  Pulse  Avg: 96.1  Min: 85  Max: 110    Respirations: 15  Resp  Avg: 16.2  Min: 14  Max: 21    SpO2: 97 %  SpO2  Avg: 97.3 %  Min: 96 %  Max: 100 %       No Data Recorded     Yesterday's intake and output:  02/19 0600 - 02/20 0559  In: 350 [I.V.:100]  Out: 0     Physical Exam  GENERAL:  Thin appearing woman in NAD.  Pleasant and cooperative.     HEENT: EOMI. Oropharynx appears moist. No LAD.   CARDIOVASCULAR:  Tachycardic and irregular rhythm.   LUNGS: CTAB  ABDOMEN: Soft, non-distended. Normoactive bowel sounds. Non-tender  EXTREMITIES: Warm and well perfused.   No asterixis     Labs seen and reviewed.     Micro  GI pathogen panel 2/14 - norovirus   BCx 2/14 - NGTD    Radiology/Imaging/Procedures:   CXR 2/19:   1. Interval placement of a transesophageal enteric tube, likely looped back   upon itself in the gastric body.  2. Significant interval increase in right pleural fluid collection. Increased   right basal subsegmental atelectasis.  3. Left lung shows good expansion and is clear with no left pleural effusion.  4. Distention azygos vein suggesting mild volume overload.  5. No other significant interval change.      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 Abdominal ultrasound on admission shows patency of vessels.   --Hepatic encephalopathy: continue lactulose/rifaximin, IR  plan to downsize TIPS on Tuesday; NPO Monday night.   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   --Ascites: lasix 20/aldactone 50. S/p TIPS   Recurrent hepatic hydrothorax: will ask IR to perform thoracentesis with studies on Monday.   --HCC: CT 06/2014 without lesions c/f HCC, repeat due in May.     #Afib: Initially in RVR but HR now controlled on diltiazem.   -continue diltiazem   -Previous team had extensive discussion with patient and family regarding risk/benefits of anticoagulation and family wanted to pursue [facility] with xarelto however will defer beginning ac at this time given patient is at a high fall risk in setting of acute encephalopathy. Patient and family expressed agreement with this plan and discussion regarding ac will be revisited once patient's mentation improves.     #leukocytosis: mild. 10.5 today from 8.1 yesterday. Afebrile. CXR yesterday with worsened pleural effusion but no focal consolidation seen.   -check UA.   -repeat c diff   -if persists and/or febrile, will need diagnostic para     #norovirus: diarrhea resolved.   --Hydration, encourage oral intake, and also contact precautions    #  DM2: not well controlled   --will increase lantus to 30units and 9units lispro qac     #Osteoporosis  --Continue home calcium and vitamin D    #GERD:   --Continue pepcid 20 mg daily    VTE PPx: SQ heparin   CODE : Full Code  DISPO: Pending clinical improvement.

## 2014-09-30 NOTE — Interdisciplinary (Signed)
1st to call text paged for blood glucose of 303mg /dL

## 2014-10-01 LAB — COMPREHENSIVE METABOLIC PANEL, BLOOD
ALT (SGPT): 18 U/L (ref 0–33)
AST (SGOT): 18 U/L (ref 0–32)
Albumin: 3.1 g/dL — ABNORMAL LOW (ref 3.5–5.2)
Alkaline Phos: 82 U/L (ref 35–140)
Anion Gap: 16 mmol/L — ABNORMAL HIGH (ref 7–15)
BUN: 25 mg/dL — ABNORMAL HIGH (ref 6–20)
Bicarbonate: 17 mmol/L — ABNORMAL LOW (ref 22–29)
Bilirubin, Tot: 4.22 mg/dL — ABNORMAL HIGH (ref <1.20)
Calcium: 8.6 mg/dL (ref 8.5–10.6)
Chloride: 108 mmol/L — ABNORMAL HIGH (ref 98–107)
Creatinine: 0.66 mg/dL (ref 0.51–0.95)
GFR: >60 mL/min
Glucose: 281 mg/dL — ABNORMAL HIGH (ref 70–115)
Potassium: 3.3 mmol/L — ABNORMAL LOW (ref 3.5–5.1)
Sodium: 141 mmol/L (ref 136–145)
Total Protein: 5.4 g/dL — ABNORMAL LOW (ref 6.0–8.0)

## 2014-10-01 LAB — CBC WITH DIFF, BLOOD
ANC-Automated: 10 10*3/uL — ABNORMAL HIGH (ref 1.6–7.0)
Abs Lymphs: 1 10*3/uL (ref 0.8–3.1)
Abs Monos: 0.6 10*3/uL (ref 0.2–0.8)
Hct: 27.7 % — ABNORMAL LOW (ref 34.0–45.0)
Hgb: 9.4 gm/dL — ABNORMAL LOW (ref 11.2–15.7)
Imm Gran %: 1 % (ref <1)
Imm Gran Abs: 0.1 10*3/uL (ref <0.1)
Lymphocytes: 8 % — ABNORMAL LOW (ref 19–53)
MCH: 33.6 pg — ABNORMAL HIGH (ref 26.0–32.0)
MCHC: 33.9 % (ref 32.0–36.0)
MCV: 98.9 um3 — ABNORMAL HIGH (ref 79.0–95.0)
MPV: 10.2 fL (ref 9.4–12.4)
Monocytes: 5 % (ref 5–12)
Plt Count: 79 10*3/uL — ABNORMAL LOW (ref 140–370)
RBC: 2.8 10*6/uL — ABNORMAL LOW (ref 3.90–5.20)
RDW: 21.2 % — ABNORMAL HIGH (ref 12.0–14.0)
Segs: 86 % — ABNORMAL HIGH (ref 34–71)
WBC: 11.7 10*3/uL — ABNORMAL HIGH (ref 4.0–10.0)

## 2014-10-01 LAB — GLUCOSE (POCT)
GLUCOSE (POCT): 371 mg/dL — ABNORMAL HIGH (ref 70–115)
GLUCOSE (POCT): 378 mg/dL — ABNORMAL HIGH (ref 70–115)
GLUCOSE (POCT): 248 mg/dL — ABNORMAL HIGH (ref 70–115)
Glucose (POCT): 326 mg/dL — ABNORMAL HIGH (ref 70–115)
Glucose (POCT): 400 mg/dL — ABNORMAL HIGH (ref 70–115)

## 2014-10-01 LAB — URINALYSIS
Bilirubin: NEGATIVE
Ketones: NEGATIVE
Nitrite: NEGATIVE
Specific Gravity: 1.023 (ref 1.002–1.030)
pH: 6 (ref 5.0–8.0)

## 2014-10-01 LAB — MAGNESIUM, BLOOD: Magnesium: 2.1 mg/dL (ref 1.7–2.6)

## 2014-10-01 LAB — PHOSPHORUS, BLOOD: Phosphorous: 2.3 mg/dL — ABNORMAL LOW (ref 2.7–4.5)

## 2014-10-01 LAB — PROTHROMBIN TIME, BLOOD
INR: 1.8
PT,Patient: 19.4 s — ABNORMAL HIGH (ref 9.7–12.5)

## 2014-10-01 MED ORDER — INSULIN LISPRO (HUMAN) 100 UNIT/ML SC SOLN (CUSTOM)
12.0000 [IU] | Freq: Three times a day (TID) | INTRAMUSCULAR | Status: DC
Start: 2014-10-01 — End: 2014-10-02

## 2014-10-01 MED ORDER — SODIUM CHLORIDE 0.9 % IV SOLN
1000.0000 mg | INTRAVENOUS | Status: DC
Start: 2014-10-01 — End: 2014-10-03
  Administered 2014-10-01 – 2014-10-02 (×2): 1000 mg via INTRAVENOUS
  Filled 2014-10-01 (×2): qty 1000

## 2014-10-01 MED ORDER — INSULIN GLARGINE 100 UNIT/ML SC SOLN
35.0000 [IU] | Freq: Every day | SUBCUTANEOUS | Status: DC
Start: 2014-10-01 — End: 2014-10-09
  Administered 2014-10-01 – 2014-10-08 (×8): 35 [IU] via SUBCUTANEOUS
  Filled 2014-10-01 (×8): qty 35

## 2014-10-01 MED ORDER — INSULIN LISPRO (HUMAN) 100 UNIT/ML SC SOLN (CUSTOM)
1.0000 [IU] | Freq: Four times a day (QID) | INTRAMUSCULAR | Status: DC
Start: 2014-10-01 — End: 2014-10-13
  Administered 2014-10-01: 12:00:00 10 [IU] via SUBCUTANEOUS
  Administered 2014-10-01: 22:00:00 6 [IU] via SUBCUTANEOUS
  Administered 2014-10-01 – 2014-10-02 (×2): 10 [IU] via SUBCUTANEOUS
  Administered 2014-10-02: 08:00:00 4 [IU] via SUBCUTANEOUS
  Administered 2014-10-02: 19:00:00 5 [IU] via SUBCUTANEOUS
  Administered 2014-10-03 – 2014-10-04 (×2): 3 [IU] via SUBCUTANEOUS
  Administered 2014-10-04 – 2014-10-05 (×3): 4 [IU] via SUBCUTANEOUS
  Administered 2014-10-05 – 2014-10-06 (×3): 5 [IU] via SUBCUTANEOUS
  Administered 2014-10-06: 08:00:00 4 [IU] via SUBCUTANEOUS
  Administered 2014-10-06 (×2): 8 [IU] via SUBCUTANEOUS
  Administered 2014-10-07: 22:00:00 4 [IU] via SUBCUTANEOUS
  Administered 2014-10-07 (×2): 5 [IU] via SUBCUTANEOUS
  Administered 2014-10-08 (×2): 3 [IU] via SUBCUTANEOUS
  Administered 2014-10-09: 1 [IU] via SUBCUTANEOUS
  Administered 2014-10-09 – 2014-10-10 (×2): 6 [IU] via SUBCUTANEOUS
  Administered 2014-10-10: 13:00:00 8 [IU] via SUBCUTANEOUS
  Administered 2014-10-10: 10:00:00 6 [IU] via SUBCUTANEOUS
  Administered 2014-10-10 – 2014-10-11 (×3): 10 [IU] via SUBCUTANEOUS
  Administered 2014-10-11: 21:00:00 6 [IU] via SUBCUTANEOUS
  Administered 2014-10-11: 09:00:00 8 [IU] via SUBCUTANEOUS
  Administered 2014-10-12: 08:00:00 6 [IU] via SUBCUTANEOUS
  Administered 2014-10-12: 12:00:00 8 [IU] via SUBCUTANEOUS
  Administered 2014-10-12: 18:00:00 4 [IU] via SUBCUTANEOUS
  Filled 2014-10-01 (×2): qty 4
  Filled 2014-10-01: qty 1
  Filled 2014-10-01: qty 10
  Filled 2014-10-01: qty 1
  Filled 2014-10-01: qty 4
  Filled 2014-10-01: qty 5
  Filled 2014-10-01: qty 6
  Filled 2014-10-01: qty 5
  Filled 2014-10-01: qty 3
  Filled 2014-10-01: qty 1
  Filled 2014-10-01: qty 4
  Filled 2014-10-01: qty 3
  Filled 2014-10-01 (×5): qty 6
  Filled 2014-10-01: qty 1
  Filled 2014-10-01: qty 2
  Filled 2014-10-01: qty 10
  Filled 2014-10-01: qty 1
  Filled 2014-10-01: qty 5
  Filled 2014-10-01: qty 1
  Filled 2014-10-01: qty 10
  Filled 2014-10-01: qty 5
  Filled 2014-10-01: qty 2
  Filled 2014-10-01: qty 5
  Filled 2014-10-01: qty 1
  Filled 2014-10-01: qty 4
  Filled 2014-10-01: qty 8
  Filled 2014-10-01: qty 4
  Filled 2014-10-01: qty 3
  Filled 2014-10-01: qty 10
  Filled 2014-10-01: qty 6
  Filled 2014-10-01: qty 10
  Filled 2014-10-01: qty 3
  Filled 2014-10-01: qty 2
  Filled 2014-10-01: qty 10

## 2014-10-01 MED ORDER — DILTIAZEM HCL ER BEADS 120 MG OR CP24
120.0000 mg | ORAL_CAPSULE | Freq: Every day | ORAL | Status: DC
Start: 2014-10-01 — End: 2014-10-01

## 2014-10-01 MED ORDER — INSULIN LISPRO (HUMAN) 100 UNIT/ML SC SOLN (CUSTOM)
6.0000 [IU] | Freq: Three times a day (TID) | INTRAMUSCULAR | Status: DC
Start: 2014-10-01 — End: 2014-10-02
  Administered 2014-10-01: 3 [IU] via SUBCUTANEOUS
  Administered 2014-10-01 – 2014-10-02 (×2): 6 [IU] via SUBCUTANEOUS
  Filled 2014-10-01 (×3): qty 6

## 2014-10-01 MED ORDER — DILTIAZEM HCL 30 MG OR TABS
45.00 mg | ORAL_TABLET | Freq: Four times a day (QID) | ORAL | Status: AC
Start: 2014-10-01 — End: 2014-10-12
  Administered 2014-10-01 – 2014-10-12 (×43): 45 mg via ORAL
  Filled 2014-10-01 (×44): qty 2

## 2014-10-01 MED ORDER — POTASSIUM PHOSPHATE 10 MEQ/100 ML D5W
10.00 meq | Status: AC
Start: 2014-10-01 — End: 2014-10-01
  Administered 2014-10-01 (×2): 10 meq via INTRAVENOUS
  Filled 2014-10-01 (×2): qty 100

## 2014-10-01 NOTE — Progress Notes (Signed)
Endocrine/Diabetes Progress Note    Overnight:  No acute events overnight       Objective:   Temperature:  [98 F (36.7 C)-99.2 F (37.3 C)] 98.8 F (37.1 C) (02/21 0800)  Blood pressure (BP): (129-145)/(41-63) 140/51 mmHg (02/21 0800)  Heart Rate:  [100-115] 100 (02/21 0800)  Respirations:  [14-23] 14 (02/21 0800)  Pain Score:  [-] 0 (02/21 0800)  O2 Device:  [-] None (Room air) (02/21 0800)  SpO2:  [96 %-99 %] 97 % (02/21 0800)  Body mass index is 21.26 kg/(m^2).      Diet: Carb limited; thickened liquids- Nectar  Nutritional Supplement Glucerna      Blood Sugars reviewed:       AM  Lu  Di  HS  O/N    2/18  381  402  326  229  Lantus   25  Lispro +6  +6  5+6  +2    2/19  190  215  Lantus  25  Lispro  +2  +3    A/P:  Paula Manning is a 74 year old female with uncontrolled DM2 c/b neuropathy as well as HepC cirrhosis, HTN, glaucoma and migraines admitted 10/05/2014 w/ n/v and AMS.     BS have been running in the 200-300s so will go up on all insulins.  Inpatient Recs:  - increase lantus from 30 to 35 units qday  -increase lispro from 9 to 12  units qac, RN to give 0, 1/2, whole dose based on % tray consumed  - increase from moderate to high Lispro correction scale    Outpatient Recs:  Was on Lantus 20 units qhs, Januvia 50 mg qday and glipizide 2.5 mg bid w/ A1C 6.2%, but home BG 200-400s in setting of OJ-carrot juice and ++rice/beans intake. Final doses TBD based on inpatient needs.    Thank you for this interesting consult, will cont to follow

## 2014-10-01 NOTE — Progress Notes (Signed)
Hepatology Progress Note  Consult Attending: Dr. Andreas Ohm, MD    24h events:   -Per family back at baseline today, but still periods of intermittent confusion  -UA consistent with UTI    Past Medical History:  Past Medical History   Diagnosis Date    Migraine headache     Glaucoma     Ascites 06/29/2007     History of paracentesis x 2    Pancreatic cyst 06/29/2007    Cirrhosis ([place]) 06/24/2007     Active on the liver transplant waiting list with a blood type of O positive. 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 Due to Hep C    Osteopenia 06/24/2007    Emphysema 06/24/2007    EV (esophageal varices) ([place]) 06/10/2007    Anemia 06/10/2007    HCV (hepatitis C virus) 06/10/2007     Achieved SVR.    Type II or unspecified type diabetes mellitus with peripheral circulatory disorders, not stated as uncontrolled(250.70) 06/10/2007    HTN 06/10/2007    HTN 06/10/2007       Past Surgical History:   Past Surgical History   Procedure Laterality Date    Pb laps surg cholecstc w/expl common duct  1970s     in [place];  open CCX    Pb cesarean delivery only      Pb cstoplasty/cstourtp plstc any       Bladder suspension    Egd procedure  06/01/2013     Procedure: GI EGD;  Surgeon: Jeanette Caprice, MD;  Location: [facility];  Service: Gastroenterology;;    Tips procedure         Allergies:  No Known Allergies    Home Medications:  Prescriptions prior to admission   Medication Sig Dispense Refill Last Dose    alendronate (FOSAMAX) 70 MG tablet    09/22/2014    ALPHAGAN P 0.1 % ophthalmic solution    09/22/2014    bacitracin 500 UNIT/GM ointment Apply small amount to affected area on face twice daily 1 Tube 1 09/22/2014    CALCIUM CARBONATE 600 MG OR TABS 1 tablet 2 times daily one month 11 09/22/2014    ciprofloxacin (CIPRO) 500 MG tablet Take 1 tablet by mouth daily. 30 tablet 5 09/22/2014    furosemide (LASIX) 20 MG tablet Take 2 tablets by mouth daily. 90 tablet 0 09/22/2014    glipiZIDE (GLUCOTROL) 5 MG  tablet Take 0.5 tablets (2.5 mg) by mouth 2 times daily. 30 tablet 1 09/22/2014    insulin glargine (LANTUS) 100 UNIT/ML injection Inject 20 Units under the skin daily. 10 mL 1 09/22/2014    lactulose 10 GM/15ML solution Take 30 mLs (20 g) by mouth 3 times daily. 946 mL 0 09/22/2014    LUMIGAN 0.01 %    09/22/2014    multivitamin (MULTI-VITAMIN) tablet Take 1 tablet by mouth daily.   09/22/2014    ranitidine (ZANTAC) 300 MG tablet Take 300 mg by mouth 2 times daily.   09/22/2014    rifaximin (XIFAXAN) 550 MG tablet Take 550 mg by mouth every 12 hours.   09/22/2014    sitagliptin (JANUVIA) 50 MG tablet Take 1 tablet (50 mg) by mouth daily. 60 tablet 1 09/22/2014    spironolactone (ALDACTONE) 50 MG tablet Take 1 tablet (50 mg) by mouth daily. 30 tablet 0 09/22/2014    VITAMIN D 400 UNIT OR CAPS 1 CAPSULE DAILY 30 11 09/22/2014       Inpatient Medications:  Scheduled Meds  cefTRIAXone (ROCEPHIN) IVPB  1,000 mg Q24H NR    cholecalciferol  400 Units Daily    diltiazem  45 mg Q6H    famotidine  20 mg Daily    furosemide  20 mg Daily    insulin glargine  35 Units Daily    insulin lispro  1-10 Units 4x Daily WC    insulin lispro  12 Units TID WC    insulin lispro  6 Units TID WC    lactulose  20 g BID    multivitamin  1 tablet Daily    rifaximin  550 mg Q12H    sodium chloride (PF)  3 mL Q8H    spironolactone  50 mg Daily     IV Meds   sodium chloride       Social History:   Lives with husband. Has 10 children. Was a homemaker.  Denies alcohol, drugs, tobacco.     Review of Systems: -   Constitutional: Denies fevers, chills, weight loss  ENT: negative for eye changes, oral lesions, difficulty swallowing  Cardiac: Denies chest pain or palpitations  Lungs: Denies cough or shortness of breath  GI: Negative except noted in the HPI  GU: Denies dysuria, hematuria  Skin: Denies rashes or other skin lesions  MSK: Denies joint deformities, new joint pain  Lymph: Denies enlarged nodes, night sweats, abnormal  bleeding  Psych: negative for depressed mood, anxiety  Neuro: negative for confusion, weakness, sensory changes    OBJECTIVE:   Vitals signs:   Latest Entry  Range (last 24 hours)    Temperature: 98.8 F (37.1 C)  Temp  Avg: 98.6 F (37 C)  Min: 98 F (36.7 C)  Max: 99.2 F (37.3 C)    Blood pressure (BP): 152/60 mmHg  BP  Min: 129/41  Max: 152/60    Heart Rate: 112  Pulse  Avg: 107.3  Min: 100  Max: 115    Respirations: 14  Resp  Avg: 17  Min: 14  Max: 23    SpO2: 97 %  SpO2  Avg: 97 %  Min: 96 %  Max: 99 %       No Data Recorded     Weight - scale: 51 kg (112 lb 7 oz)  Percentage Weight Change (%): -0.97 %    Intake/Output       09/30/14 0600 - 10/01/14 0559 10/01/14 0600 - 09/30/2014 0559      [id number] 1800-0559 Total 0600-1759 1800-0559 Total       Intake    P.O.  630  220 850  --  -- --    I.V.  --  -- --  250  -- 250    NG/GT  250  50 300  80  -- 80    Total Intake [phone] 330 -- 330       Output    Urine  --  1 1  --  -- --    Total Output -- 1 1 -- -- --       Net I/O     [phone] 330 -- 330        Respiratory support:  Oxygen Therapy  SpO2: 97 %  O2 Device: None (Room air)  O2 Flow Rate (L/min): 2 l/min     Physical Exam:  General:  Well developed, thin elderly Hispanic female in no apparent distress, somnolent, a&o to person and place (never oriented to date per son)  HEENT:  No scleral  icterus, Trachea midline, mucus membranes moist.  Lungs:  Diminished BS at R base  Cardiovascular:  Irregularly irregular  Abdomen:  Abdomen soft, no tender to palpation, bowel sound present. No peritoneal signs.  Extremities:  Trace peripheral edema RLE > LLE. Warm well-perfused.  Skin:  Non-icteric and no visible rash  Neuro:  Cranial nerves II-XII grossly intact. Mild asterixis    Labs:     CBC  Recent Labs      09/30/14   0541  10/01/14   0550   WBC  10.5*  11.7*   HGB  8.7*  9.4*   HCT  25.5*  27.7*   PLT  75*  79*   SEG  87*  86*   LYMPHS  4*  8*   MONOS  9  5      Chemistry  Recent Labs      09/30/14    0541  10/01/14   0550   NA  140  141   K  3.5  3.3*   CL  108*  108*   BICARB  20*  17*   BUN  29*  25*   CREAT  0.65  0.66   GLU  214*  281*   [place]inez  9.2  8.6   MG  2.3  2.1   PHOS  2.0*  2.3*     Recent Labs      09/30/14   0541  10/01/14   0550   ALK  75  82   AST  21  18   ALT  19  18   TBILI  4.10*  4.22*   ALB  3.5  3.1*        Coags  Recent Labs      09/30/14   0541  10/01/14   0550   PT  20.0*  19.4*   INR  1.8  1.8       Radiology:   CT A/P w/ contrast 09/25/14: FINDINGS:  LUNG BASES: Small to moderate right pleural effusion with right lower lobe   atelectasis  LIVER/BILIARY:Diffuse nodularity and hepatic atrophy. Stable scattered tiny   low attenuation lesions in the liver.  PANCREAS: Stable numerous pancreatic cysts.  SPLEEN: Unremarkable.  ADRENAL GLANDS: Unremarkable  KIDNEYS: Stable left renal cyst.  STOMACH/DUODENUM: Mild wall thickening from the distal stomach to the proximal  jejunum  VASCULATURE: Patent TIPS. Stable nonocclusive thrombus of the main portal vein  and superior mesenteric vein. New thrombosis of the left portal vein.  LYMPHATIC: Unremarkable  SMALL & LARGE BOWEL: Unremarkable  BLADDER/PELVIC ORGANS: Unremarkable  BONES/SOFT TISSUES: Unremarkable  OTHER: Trace pelvic ascites    IMPRESSION:  1) New thrombosis of the left portal vein. Vessel was patent on prior   abdominal ultrasound 09/25/2014  2) Edema of the distal stomach through proximal jejunum suggestive of   enteritis, nonspecific    CT head 09/13/2014: IMPRESSION:  1. No evidence of acute intracranial hemorrhage or findings to suggest   territorial/transcortical infarct.  2. Mild white matter changes. These may be associated with prior small vessel   ischemia. If there is concern for encephalopathy, MRI would be more helpful   for acute evaluation. MRI screening: No intracranial or intraorbital metal.  3. Frothy secretions within the right sphenoid sinuses well as reactive bone   changes, suggestive of acute on chronic sinusitis.    RUQ  U/S 10/08/2014: FINDINGS:  The liver measures 11.3 cm in long axis. It is normal in echogenicity. No  focal lesions are identified. There is no intra or extra hepatic bile duct   dilation. The common bile duct measures four mm. The gallbladder is not   visualized. A patent TIPS catheter is present. The TIPS velocities are: Portal  end 115 ; mid 93 ; hepatic 125 cm/s. Flow in the right and left portal veins   is hepatofugal and towards the TIPS ; flow in the main portal vein is   hepatopetal.  The right kidney measures nine cm in long axis and is within normal limits   with no hydronephrosis or renal calculi.  The visualized portion of the pancreas again demonstrates multiple cystic   lesions throughout the pancreas, as seen on prior imaging  The visualized aorta and inferior vena cava are within normal limits.  Right pleural effusion. No evidence of intra abdominal ascites.    IMPRESSION:  Patent TIPS catheter with normal velocities and appropriate flow in portal   veins.  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      Prior Endoscopy:   EGD 05/2013:  Findings: 1. Midline orophyarngeal prominence at the  arytenoid  2. Three columnns of grade 1 esophageal varices, no high  risk features  3. No gastric varices, mild portal hypertensive gastropathy  4. Few punctate clots in the antrum  5. Mild duodenal congestion consistent with portal  hypertension  Endoscopic Diagnosis: 1. Midline orophyarngeal  prominence at the arytenoid  2. Three columnns of grade 1 esophageal varices, no high  risk features  3. No gastric varices, mild portal hypertensive gastropathy  4. Few punctate clots in the antrum  5. 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 Continue propranolol 10 BID  2. ENT consultation to evaluate oropharyngeal findings  3. 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 Confusion associated with poor medication compliance, including lactulose, N/V, abd pain, diarrhea, and f/c/s. HDS. Work-up here showed GI pathogen panel with norovirus and recent UA consistent with UTI.     #AMS: Improving, but still with intermittent confusion. Likely precipitated by infection from UTI and norovirus infection.     -started on CTX for UTI, mont WBC after starting CTX, f/u Ucx  -cont supportive care for norovirus  -deferring previous plans to downsize TIPS given several infectious precipitants and TIPS downsizing will increase rate of pleural effusion accumulation.    #)Norovirus: likely responsible for N/V, abd pain and diarrhea  - CT A/P w/out acute pathology  - Follow up blood cultures: NGTD  - Supportive care with IVF and anti-emetics    #)Afib w rvr  S/p diltiazem, now rate controlled. CHADS score 2, chads vsc 4. High risk of fall due to her hepatic encephalopathy, highly concerned for the possibility of anticoagulation. We would hold anticoagulation at this time. Patient also will be evaluated from IR for possible downsizing her transjugular intrahepatic portosystemic shunt, as her MS not yet back to her baseline.  - hold anticoagulation until encephalopathy fully resolved  - cont rate control    #)  HCV GT2 cirrhosis c/b ascites, HE and diuretic resistant HHT now s/p TIPS 06/28/2014 and TIPS revision 09/14/14 MELD 18    * HE: still encephalopathic but improved, likely related to infection and TIPS revision,    - Continue lactulose titrate for 3-4 soft bowel movements a day and rifaximin  * Ascites: No ascites on recent U/S, continue to hold her diuretics  * EV's:  S/p TIPS, no longer needs surveillance if TIPS is patent and she is stable  * HHT: S/p TIPS and TIPS revision   - RUQ U/S 2/13 with patent TIPS   - diuretics lasix 20, aldactone 50   - planning for repeat dx and therapeutic thora with VIR on Monday  * Non-occlusive portal vein thrombosis: Multiple outpatient notes indicate poor medication adherence/knowledge, remains fall risk as well.  Risks outweigh benefits  * [place] surveillance: Last CT on 06/27/2014 showed no HCC. Repeat imaging in May 2016.  * Transplant: Age >49, not a candidate    DWA Dr. Silverio Decamp, MD  [facility]

## 2014-10-01 NOTE — Plan of Care (Signed)
Problem: Discharge Planning  Goal: Participation in care planning  Outcome: Met  No orders for discharge, Continue to monitor.     Problem: Falls, Risk of  Goal: Keep patient free from falls utilizing universal fall precautions  Outcome: Met  Fall plan implemented. Bed in low position, bed rails x 2, bed alarm on, call light within reach, non skid socks on. Patient verbalizes understanding of call light use. Family x 2 at bedside        Problem: Tissue Perfusion, Cardiopulmonary - Altered  Goal: Hemodynamic stability  Outcome: Met  A-fib, Bp stable. No complaint of chest pain or SOB.     Problem: Pain - Acute  Goal: Communication of presence of pain  Outcome: Met  Patient able verbalize pain to staff. Denies pain at this time.         Problem: Alteration in Blood Glucose  Goal: Glucose level within specified parameters  Outcome: Met  Continue to monitor Blood glucose. MD notified for HS blood glucose of >275    Problem: Infection  Goal: Absence of infection signs and symptoms  Outcome: Met  Afebrile at this time. WBC 10.5 continue to monitor    Problem: Skin Integrity- Intact patient high risk for impaired skin integrity Braden scale less than or equal to 18  Goal: Skin remains intact  Outcome: Met  Kept clean and dry. Repositioned Q 2 hrs.     Problem: Aspiration, Risk of  Goal: Evidence of swallowing difficulties is absent  Outcome: Met  1:1 feeder, tolerating pureed diet and nectar thick liquid

## 2014-10-01 NOTE — Plan of Care (Signed)
Problem: Discharge Planning  Goal: Participation in care planning  Outcome: Met  Received report from rn Arnel, pt awake in bed w/ no noted distress or complaints, denies any pain or discomfort, pt fully oriented and cooperative, call button w/in reach, voices understanding and plan to use same for assist, family remains at bedside, pt and family actively participating in [place].

## 2014-10-01 NOTE — Progress Notes (Signed)
Inpatient Medicine Progress Note       Interval Events/Subjective:   No overnight events. Had 3 BM over the last day. Mental status improved but still with periods of confusion in the early AM and evenings.     Vitals signs:     Latest Entry  Range (last 24 hours)    Temperature: 98.8 F (37.1 C)  Temp  Avg: 98.6 F (37 C)  Min: 98 F (36.7 C)  Max: 99.2 F (37.3 C)    Blood pressure (BP): 140/51 mmHg  BP  Min: 129/41  Max: 145/63    Heart Rate: 100  Pulse  Avg: 106.8  Min: 100  Max: 115    Respirations: 14  Resp  Avg: 17  Min: 14  Max: 23    SpO2: 97 %  SpO2  Avg: 97 %  Min: 96 %  Max: 99 %       No Data Recorded     Yesterday's intake and output:  02/20 0600 - 02/21 0559  In: 1150 [P.O.:850]  Out: 1 [Urine:1]    Physical Exam  GENERAL:  Thin appearing woman in NAD.  Pleasant and cooperative.     HEENT: EOMI. Oropharynx appears moist. No LAD.   CARDIOVASCULAR:  Tachycardic and irregular rhythm.   LUNGS: CTAB  ABDOMEN: Soft, non-distended. Normoactive bowel sounds. Non-tender  EXTREMITIES: Warm and well perfused.   No asterixis     Labs seen and reviewed.     Micro  GI pathogen panel 2/14 - norovirus   BCx 2/14 - NGTD  Uclx 2/20: pending     Radiology/Imaging/Procedures:   CXR 2/19:   1. Interval placement of a transesophageal enteric tube, likely looped back   upon itself in the gastric body.  2. Significant interval increase in right pleural fluid collection. Increased   right basal subsegmental atelectasis.  3. Left lung shows good expansion and is clear with no left pleural effusion.  4. Distention azygos vein suggesting mild volume overload.  5. No other significant interval change.      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     #AMS: multifactorial, contribution of HE + infection with norovirus and now possibly UTI. Improving.   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 Abdominal ultrasound on admission shows patency of vessels.   --Hepatic encephalopathy: continue lactulose/rifaximin, Improving. If continues to improve, can defer downsizing of TIPS given that downsizing will likely lead to recurrent re accumulating HHT.   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   --Ascites: lasix 20/aldactone 50. S/p TIPS   Recurrent hepatic hydrothorax: will ask IR to perform thoracentesis with studies on Monday. Will likely need FFP prior given elevated INR, will touch base with IR.   --HCC: CT 06/2014 without lesions c/f HCC, repeat due in May.     #Afib: Initially in RVR but HR now better controlled on oral diltiazem  -continue diltiazem, change to ER once dose determined   -Previous team had extensive discussion with patient and family regarding risk/benefits of anticoagulation and family wanted to pursue [facility] with xarelto however will defer beginning ac at this time given patient is  at a high fall risk in setting of acute encephalopathy. Patient and family expressed agreement with this plan and discussion regarding ac will be revisited pending improvement in her encephalopathy.     #leukocytosis: up to 11.7 today. Afebrile. UA yesterday suggestive of UTI though sample with many squames. CXR yesterday with worsened pleural effusion but no focal consolidation seen. C diff negative.   -begin empiric CTX for possible UTI, repeat UA, follow up urine culture.   -f/u blood clx  -thoracentesis tomorrow with studies.   -diagnostic para, if possible.     #norovirus: diarrhea resolved.   --Hydration, encourage oral intake, and also contact precautions    #DM2: not well controlled   --will increase lantus to 35units and 12units lispro qac per endocrine recs  --add insulin coverage for  glucerna supplements   --high intensity SSI     #Osteoporosis  --Continue home calcium and vitamin D    #GERD:   --Continue pepcid 20 mg daily    VTE PPx: SQ heparin   CODE : Full Code  DISPO: Pending clinical improvement.

## 2014-10-01 NOTE — Progress Notes (Signed)
Hepatology Progress Note  Consult Attending: Dr. Andreas Ohm, MD    24h events:   -Per family back at baseline today  -excess BM's  -CXR with reaccumulation in R pleural effusion    Past Medical History:  Past Medical History   Diagnosis Date    Migraine headache     Glaucoma     Ascites 06/29/2007     History of paracentesis x 2    Pancreatic cyst 06/29/2007    Cirrhosis ([place]) 06/24/2007     Active on the liver transplant waiting list with a blood type of O positive. 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 Due to Hep C    Osteopenia 06/24/2007    Emphysema 06/24/2007    EV (esophageal varices) ([place]) 06/10/2007    Anemia 06/10/2007    HCV (hepatitis C virus) 06/10/2007     Achieved SVR.    Type II or unspecified type diabetes mellitus with peripheral circulatory disorders, not stated as uncontrolled(250.70) 06/10/2007    HTN 06/10/2007    HTN 06/10/2007       Past Surgical History:   Past Surgical History   Procedure Laterality Date    Pb laps surg cholecstc w/expl common duct  1970s     in [place];  open CCX    Pb cesarean delivery only      Pb cstoplasty/cstourtp plstc any       Bladder suspension    Egd procedure  06/01/2013     Procedure: GI EGD;  Surgeon: Jeanette Caprice, MD;  Location: [facility];  Service: Gastroenterology;;    Tips procedure         Allergies:  No Known Allergies    Home Medications:  Prescriptions prior to admission   Medication Sig Dispense Refill Last Dose    alendronate (FOSAMAX) 70 MG tablet    09/22/2014    ALPHAGAN P 0.1 % ophthalmic solution    09/22/2014    bacitracin 500 UNIT/GM ointment Apply small amount to affected area on face twice daily 1 Tube 1 09/22/2014    CALCIUM CARBONATE 600 MG OR TABS 1 tablet 2 times daily one month 11 09/22/2014    ciprofloxacin (CIPRO) 500 MG tablet Take 1 tablet by mouth daily. 30 tablet 5 09/22/2014    furosemide (LASIX) 20 MG tablet Take 2 tablets by mouth daily. 90 tablet 0 09/22/2014    glipiZIDE (GLUCOTROL) 5 MG tablet  Take 0.5 tablets (2.5 mg) by mouth 2 times daily. 30 tablet 1 09/22/2014    insulin glargine (LANTUS) 100 UNIT/ML injection Inject 20 Units under the skin daily. 10 mL 1 09/22/2014    lactulose 10 GM/15ML solution Take 30 mLs (20 g) by mouth 3 times daily. 946 mL 0 09/22/2014    LUMIGAN 0.01 %    09/22/2014    multivitamin (MULTI-VITAMIN) tablet Take 1 tablet by mouth daily.   09/22/2014    ranitidine (ZANTAC) 300 MG tablet Take 300 mg by mouth 2 times daily.   09/22/2014    rifaximin (XIFAXAN) 550 MG tablet Take 550 mg by mouth every 12 hours.   09/22/2014    sitagliptin (JANUVIA) 50 MG tablet Take 1 tablet (50 mg) by mouth daily. 60 tablet 1 09/22/2014    spironolactone (ALDACTONE) 50 MG tablet Take 1 tablet (50 mg) by mouth daily. 30 tablet 0 09/22/2014    VITAMIN D 400 UNIT OR CAPS 1 CAPSULE DAILY 30 11 09/22/2014       Inpatient Medications:  Scheduled Meds  cholecalciferol  400 Units Daily    diltiazem  30 mg Q6H    famotidine  20 mg Daily    furosemide  20 mg Daily    insulin glargine  30 Units Daily    insulin lispro  1-10 Units 4x Daily WC    insulin lispro  9 Units TID WC    lactulose  20 g BID    multivitamin  1 tablet Daily    rifaximin  550 mg Q12H    sodium chloride (PF)  3 mL Q8H    spironolactone  50 mg Daily     IV Meds   sodium chloride       Social History:   Lives with husband. Has 10 children. Was a homemaker.  Denies alcohol, drugs, tobacco.     Review of Systems: -   Constitutional: Denies fevers, chills, weight loss  ENT: negative for eye changes, oral lesions, difficulty swallowing  Cardiac: Denies chest pain or palpitations  Lungs: Denies cough or shortness of breath  GI: Negative except noted in the HPI  GU: Denies dysuria, hematuria  Skin: Denies rashes or other skin lesions  MSK: Denies joint deformities, new joint pain  Lymph: Denies enlarged nodes, night sweats, abnormal bleeding  Psych: negative for depressed mood, anxiety  Neuro: negative for confusion, weakness, sensory  changes    OBJECTIVE:   Vitals signs:   Latest Entry  Range (last 24 hours)    Temperature: 98.3 F (36.8 C)  Temp  Avg: 98.8 F (37.1 C)  Min: 98.3 F (36.8 C)  Max: 99.5 F (37.5 C)    Blood pressure (BP): 141/58 mmHg  BP  Min: 127/59  Max: 148/63    Heart Rate: 101  Pulse  Avg: 103.1  Min: 98  Max: 110    Respirations: 15  Resp  Avg: 16.6  Min: 14  Max: 21    SpO2: 99 %  SpO2  Avg: 97.4 %  Min: 96 %  Max: 100 %       No Data Recorded     Weight - scale: 51.5 kg (113 lb 8.6 oz)  Percentage Weight Change (%): -0.77 %    Intake/Output       09/30/14 0600 - 10/01/14 0559 10/01/14 0600 - 09/20/2014 0559      [id number] 3086-5784 Total 0600-1759 6962-9528 Total       Intake    P.O.  630  220 850  --  -- --    NG/GT  250  50 300  --  -- --    Total Intake [phone] -- -- --       Output    Total Output -- -- -- -- -- --       Net I/O     [phone] -- -- --        Respiratory support:  Oxygen Therapy  SpO2: 99 %  O2 Device: None (Room air)  O2 Flow Rate (L/min): 2 l/min     Physical Exam:  General:  Well developed, thin elderly Hispanic female in no apparent distress, somnolent, a&o to person and place (never oriented to date per son)  HEENT:  No scleral icterus, Trachea midline, mucus membranes moist.  Lungs:  Clear to auscultation laterally. Normal respiratory effort.  Cardiovascular:  Regular rate and rhythm  Abdomen:  Abdomen soft, no tender to palpation, bowel sound present. No peritoneal signs.  Extremities:  Trace peripheral edema RLE > LLE. Warm well-perfused.  Skin:  Non-icteric and no visible rash  Neuro:  Cranial nerves II-XII grossly intact. Mild asterixis  Psych:  Normal affect.    Labs:     CBC  Recent Labs      09/29/14   0350  09/30/14   0541   WBC  8.1  10.5*   HGB  7.9*  8.7*   HCT  22.4*  25.5*   PLT  58*  75*   SEG  85*  87*   LYMPHS  7*  4*   MONOS  8  9      Chemistry  Recent Labs      09/29/14   0350  09/30/14   0541   NA  138  140   K  3.2*  3.5   CL  107  108*   BICARB  20*  20*   BUN  35*   29*   CREAT  0.74  0.65   GLU  174*  214*   [place]  9.1  9.2   MG  2.6  2.3   PHOS  1.6*  2.0*     Recent Labs      09/29/14   0350  09/30/14   0541   ALK  54  75   AST  23  21   ALT  17  19   TBILI  3.73*  4.10*   ALB  3.6  3.5        Coags  Recent Labs      09/29/14   0350  09/30/14   0541   PT  22.6*  20.0*   INR  2.1  1.8       Radiology:   CT A/P w/ contrast 09/25/14: FINDINGS:  LUNG BASES: Small to moderate right pleural effusion with right lower lobe   atelectasis  LIVER/BILIARY:Diffuse nodularity and hepatic atrophy. Stable scattered tiny   low attenuation lesions in the liver.  PANCREAS: Stable numerous pancreatic cysts.  SPLEEN: Unremarkable.  ADRENAL GLANDS: Unremarkable  KIDNEYS: Stable left renal cyst.  STOMACH/DUODENUM: Mild wall thickening from the distal stomach to the proximal  jejunum  VASCULATURE: Patent TIPS. Stable nonocclusive thrombus of the main portal vein  and superior mesenteric vein. New thrombosis of the left portal vein.  LYMPHATIC: Unremarkable  SMALL & LARGE BOWEL: Unremarkable  BLADDER/PELVIC ORGANS: Unremarkable  BONES/SOFT TISSUES: Unremarkable  OTHER: Trace pelvic ascites    IMPRESSION:  1) New thrombosis of the left portal vein. Vessel was patent on prior   abdominal ultrasound 09/22/2014  2) Edema of the distal stomach through proximal jejunum suggestive of   enteritis, nonspecific    CT head 09/30/2014: IMPRESSION:  1. No evidence of acute intracranial hemorrhage or findings to suggest   territorial/transcortical infarct.  2. Mild white matter changes. These may be associated with prior small vessel   ischemia. If there is concern for encephalopathy, MRI would be more helpful   for acute evaluation. MRI screening: No intracranial or intraorbital metal.  3. Frothy secretions within the right sphenoid sinuses well as reactive bone   changes, suggestive of acute on chronic sinusitis.    RUQ U/S 09/26/2014: FINDINGS:  The liver measures 11.3 cm in long axis. It is normal in echogenicity. No      focal lesions are identified. There is no intra or extra hepatic bile duct   dilation. The common bile duct measures four mm. The gallbladder is not   visualized. A patent TIPS catheter is present. The  TIPS velocities are: Portal  end 115 ; mid 93 ; hepatic 125 cm/s. Flow in the right and left portal veins   is hepatofugal and towards the TIPS ; flow in the main portal vein is   hepatopetal.  The right kidney measures nine cm in long axis and is within normal limits   with no hydronephrosis or renal calculi.  The visualized portion of the pancreas again demonstrates multiple cystic   lesions throughout the pancreas, as seen on prior imaging  The visualized aorta and inferior vena cava are within normal limits.  Right pleural effusion. No evidence of intra abdominal ascites.    IMPRESSION:  Patent TIPS catheter with normal velocities and appropriate flow in portal   veins.  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      Prior Endoscopy:   EGD 05/2013:  Findings: 1. Midline orophyarngeal prominence at the  arytenoid  2. Three columnns of grade 1 esophageal varices, no high  risk features  3. No gastric varices, mild portal hypertensive gastropathy  4. Few punctate clots in the antrum  5. Mild duodenal congestion consistent with portal  hypertension  Endoscopic Diagnosis: 1. Midline orophyarngeal  prominence at the arytenoid  2. Three columnns of grade 1 esophageal varices, no high  risk features  3. No gastric varices, mild portal hypertensive gastropathy  4. Few punctate clots in the antrum  5. 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 Continue propranolol 10 BID  2. ENT consultation to evaluate oropharyngeal findings  3. Repeat EGD in 1 year for varices screening      ASSESSMENT / PLAN:  Paula Manning is a 74 year old female F with HCV GT 2 cirrhosis (achieved SVR) complicated by  ascites, HE and diuretic resistant HHT now s/p TIPS 06/28/2014 with hepatic venous pressure gradient decreasing from 22 to 7 mm Hg and TIPS revision 09/14/14 for pleural effusion, with gradient from 13 to 6 presents to the hospital with confusion. Confusion associated with poor medication compliance, including lactulose, N/V, abd pain, diarrhea, and f/c/s. HDS. Work-up here showed GI pathogen panel with norovirus.     #)Norovirus: likely responsible for N/V, abd pain and diarrhea  - CT A/P w/out acute pathology  - Follow up blood cultures: NGTD  - Supportive care with IVF and anti-emetics    #)Afib w rvr  S/p diltiazem, now rate controlled. CHADS score 2, chads vsc 4. High risk of fall due to her hepatic encephalopathy, highly concerned for the possibility of anticoagulation. We would hold anticoagulation at this time. Patient also will be evaluated from IR for possible downsizing her transjugular intrahepatic portosystemic shunt, as her MS not yet back to her baseline.  - hold anticoagulation    #)HCV GT2 cirrhosis c/b ascites, HE and diuretic resistant HHT now s/p TIPS 06/28/2014 and TIPS revision 09/14/14 MELD 18  * HE: still encephalopathic but improved, likely related to infection and TIPS revision,    - Continue lactulose titrate for 3-4 soft bowel movements a day and rifaximin   - If MS remains at baseline, may defer TIPS revision as likely to increase need for  thoracentensis and may be high risk if ever on anticoagulation  * Ascites: No ascites on recent U/S, continue to hold her diuretics  * EV's: S/p TIPS, no longer needs surveillance if TIPS is patent and she is stable  * HHT: S/p TIPS  and TIPS revision   - RUQ U/S 2/13 with patent TIPS   - diuretics lasix 20, aldactone 50   - planning for repeat dx and therapeutic thora with VIR  * Non-occlusive portal vein thrombosis: Multiple outpatient notes indicate poor medication adherence/knowledge, remains fall risk as well.  Risks outweigh benefits  * [place]  surveillance: Last CT on 06/27/2014 showed no HCC. Repeat imaging in May 2016.  * Transplant: Age >86, not a candidate    DWA Dr. Silverio Decamp, MD  [facility]

## 2014-10-01 NOTE — Plan of Care (Signed)
Problem: Skin Integrity- Intact patient high risk for impaired skin integrity Braden scale less than or equal to 18  Goal: Skin remains intact  Outcome: Met  Pt incontinent of urine, provided cleaning, pad and diaper change, no noted s/s of impaired skin integrity, reported to receiving rn Heather.

## 2014-10-01 NOTE — Plan of Care (Signed)
Problem: Discharge Planning  Goal: Participation in care planning  Outcome: Met  Pt and family actively participating in [place].  Goal: Patient is at risk for readmission- risk factors have been addressed  Outcome: Met  Discussed readmission risks, pt and family voice understanding of same.    Problem: Falls, Risk of  Goal: Keep patient free from falls utilizing universal fall precautions  Outcome: Met  Pt making no attempts to get out of bed, family always at bedside, pt or family calling for assist, no falls.  Goal: Ensure safe mobility of patient (Mobility)  Outcome: Met  Pt assisted by family and staff to bedside chair, required max assist, remained in chair for about 30 minutes then requested return to bed, max assist again w/ same.  Goal: Minimize the risk of falls due to the effects of prescribed medications (Medications)  Outcome: Met  No falls 2/2 medication effects.    Problem: Tissue Perfusion, Cardiopulmonary - Altered  Goal: Hemodynamic stability  Outcome: Met  Vss, see doc flow sheet.  Goal: Early recognition of deterioration  Outcome: Met  No noted s/s of deterioration.    Problem: Pain - Acute  Goal: Communication of presence of pain  Outcome: Met  Denies pain.  Goal: Control of acute pain  Outcome: Met  See previous.    Problem: Alteration in Blood Glucose  Goal: Glucose level within specified parameters  Outcome: Not Met  Blood glucose elevated, see doc flow sheet, text paged 1st call and talked w/ pharmacist Elita Quick concerning same, insulin changes were ordered and provided, see mar.    Problem: Infection  Goal: Absence of infection signs and symptoms  Outcome: Met  No noted s/s of infection.    Problem: Skin Integrity- Intact patient high risk for impaired skin integrity Braden scale less than or equal to 18  Goal: Skin remains intact  Outcome: Met  No noted s/s of impaired skin integrity.    Problem: Aspiration, Risk of  Goal: Evidence of swallowing difficulties is absent  Outcome: Met  No noted s/s  of aspiration.

## 2014-10-02 ENCOUNTER — Other Ambulatory Visit (HOSPITAL_BASED_OUTPATIENT_CLINIC_OR_DEPARTMENT_OTHER): Payer: Self-pay | Admitting: Internal Medicine

## 2014-10-02 ENCOUNTER — Encounter (HOSPITAL_COMMUNITY): Admission: EM | Disposition: E | Payer: Self-pay | Attending: Internal Medicine

## 2014-10-02 DIAGNOSIS — K567 Ileus, unspecified: Secondary | ICD-10-CM

## 2014-10-02 DIAGNOSIS — J948 Other specified pleural conditions: Secondary | ICD-10-CM

## 2014-10-02 DIAGNOSIS — R140 Abdominal distension (gaseous): Secondary | ICD-10-CM

## 2014-10-02 DIAGNOSIS — K7460 Unspecified cirrhosis of liver: Secondary | ICD-10-CM

## 2014-10-02 LAB — CBC WITH DIFF, BLOOD
ANC-Automated: 15.6 10*3/uL — ABNORMAL HIGH (ref 1.6–7.0)
Abs Lymphs: 1.2 10*3/uL (ref 0.8–3.1)
Abs Monos: 1.1 10*3/uL — ABNORMAL HIGH (ref 0.2–0.8)
Hct: 26.9 % — ABNORMAL LOW (ref 34.0–45.0)
Hgb: 9.6 gm/dL — ABNORMAL LOW (ref 11.2–15.7)
Imm Gran Abs: 0.1 10*3/uL (ref <0.1)
Lymphocytes: 7 % — ABNORMAL LOW (ref 19–53)
MCH: 34.3 pg — ABNORMAL HIGH (ref 26.0–32.0)
MCHC: 35.7 % (ref 32.0–36.0)
MCV: 96.1 um3 — ABNORMAL HIGH (ref 79.0–95.0)
MPV: 10.1 fL (ref 9.4–12.4)
Monocytes: 6 % (ref 5–12)
Plt Count: 95 10*3/uL — ABNORMAL LOW (ref 140–370)
Plt Est: DECREASED
RBC: 2.8 10*6/uL — ABNORMAL LOW (ref 3.90–5.20)
RDW: 20.3 % — ABNORMAL HIGH (ref 12.0–14.0)
Segs: 87 % — ABNORMAL HIGH (ref 34–71)
WBC: 17.9 10*3/uL — ABNORMAL HIGH (ref 4.0–10.0)

## 2014-10-02 LAB — BASIC METABOLIC PANEL, BLOOD
Anion Gap: 16 mmol/L — ABNORMAL HIGH (ref 7–15)
BUN: 23 mg/dL — ABNORMAL HIGH (ref 6–20)
Bicarbonate: 18 mmol/L — ABNORMAL LOW (ref 22–29)
Calcium: 8.4 mg/dL — ABNORMAL LOW (ref 8.5–10.6)
Chloride: 104 mmol/L (ref 98–107)
Creatinine: 0.75 mg/dL (ref 0.51–0.95)
GFR: >60 mL/min
Glucose: 255 mg/dL — ABNORMAL HIGH (ref 70–115)
Potassium: 3.3 mmol/L — ABNORMAL LOW (ref 3.5–5.1)
Sodium: 138 mmol/L (ref 136–145)

## 2014-10-02 LAB — GLUCOSE (POCT)
GLUCOSE (POCT): 192 mg/dL — ABNORMAL HIGH (ref 70–115)
Glucose (POCT): 273 mg/dL — ABNORMAL HIGH (ref 70–115)
Glucose (POCT): 332 mg/dL — ABNORMAL HIGH (ref 70–115)
Glucose (POCT): 220 mg/dL — ABNORMAL HIGH (ref 70–115)
Glucose (POCT): 197 mg/dL — ABNORMAL HIGH (ref 70–115)

## 2014-10-02 LAB — COMPREHENSIVE METABOLIC PANEL, BLOOD
ALT (SGPT): 20 U/L (ref 0–33)
AST (SGOT): 21 U/L (ref 0–32)
Albumin: 3.1 g/dL — ABNORMAL LOW (ref 3.5–5.2)
Alkaline Phos: 92 U/L (ref 35–140)
Anion Gap: 15 mmol/L (ref 7–15)
BUN: 22 mg/dL — ABNORMAL HIGH (ref 6–20)
Bicarbonate: 18 mmol/L — ABNORMAL LOW (ref 22–29)
Bilirubin, Tot: 4.01 mg/dL — ABNORMAL HIGH (ref <1.20)
Calcium: 8.4 mg/dL — ABNORMAL LOW (ref 8.5–10.6)
Chloride: 106 mmol/L (ref 98–107)
Creatinine: 0.66 mg/dL (ref 0.51–0.95)
GFR: >60 mL/min
Glucose: 163 mg/dL — ABNORMAL HIGH (ref 70–115)
Potassium: 3 mmol/L — ABNORMAL LOW (ref 3.5–5.1)
Sodium: 139 mmol/L (ref 136–145)
Total Protein: 5.5 g/dL — ABNORMAL LOW (ref 6.0–8.0)

## 2014-10-02 LAB — ALBUMIN, OTHER: Albumin, Other: 1.3 g/dL

## 2014-10-02 LAB — BODY FLUID CELL CT / DIFF
Fluid RBC mm3: 1140 uL
Fluid WBC mm3: 1398 uL
Lymphocytes fluid %: 3 %
Macrophages fluid %: 25 %
Mesothelial Cells Fluid %: 2 %
Neutrophils Fluid %: 70 %

## 2014-10-02 LAB — PHOSPHORUS, BLOOD: Phosphorous: 2.7 mg/dL (ref 2.7–4.5)

## 2014-10-02 LAB — BLOOD CULTURE
BLOOD CULTURE RESULT: NO GROWTH
BLOOD CULTURE RESULT: NO GROWTH

## 2014-10-02 LAB — MAGNESIUM, BLOOD: Magnesium: 2.1 mg/dL (ref 1.7–2.6)

## 2014-10-02 LAB — PROTHROMBIN TIME, BLOOD
INR: 2.1
PT,Patient: 22.5 s — ABNORMAL HIGH (ref 9.7–12.5)

## 2014-10-02 LAB — LACTATE, BLOOD: Lactate: 1.9 mmol/L (ref 0.5–2.2)

## 2014-10-02 SURGERY — IR THORACENTESIS

## 2014-10-02 MED ORDER — POTASSIUM CHLORIDE 10 MEQ/100ML IV SOLN
10.00 meq | INTRAVENOUS | Status: AC
Start: 2014-10-02 — End: 2014-10-03
  Administered 2014-10-02 – 2014-10-03 (×3): 10 meq via INTRAVENOUS
  Filled 2014-10-02 (×3): qty 100

## 2014-10-02 MED ORDER — INSULIN LISPRO (HUMAN) 100 UNIT/ML SC SOLN (CUSTOM)
8.0000 [IU] | Freq: Three times a day (TID) | INTRAMUSCULAR | Status: DC
Start: 2014-10-02 — End: 2014-10-12
  Administered 2014-10-04 – 2014-10-09 (×10): 8 [IU] via SUBCUTANEOUS
  Administered 2014-10-09: 20:00:00 4 [IU] via SUBCUTANEOUS
  Administered 2014-10-10 – 2014-10-11 (×4): 8 [IU] via SUBCUTANEOUS
  Filled 2014-10-02 (×18): qty 8

## 2014-10-02 MED ORDER — METRONIDAZOLE IN NACL 5-0.79 MG/ML-% IV SOLN
500.0000 mg | Freq: Three times a day (TID) | INTRAVENOUS | Status: DC
Start: 2014-10-02 — End: 2014-10-03
  Administered 2014-10-02 – 2014-10-03 (×3): 500 mg via INTRAVENOUS
  Filled 2014-10-02 (×3): qty 100

## 2014-10-02 MED ORDER — INSULIN LISPRO (HUMAN) 100 UNIT/ML SC SOLN (CUSTOM)
16.0000 [IU] | Freq: Three times a day (TID) | INTRAMUSCULAR | Status: DC
Start: 2014-10-02 — End: 2014-10-03

## 2014-10-02 MED ORDER — TRAMADOL HCL 50 MG OR TABS
50.0000 mg | ORAL_TABLET | Freq: Four times a day (QID) | ORAL | Status: DC | PRN
Start: 2014-10-02 — End: 2014-10-13
  Administered 2014-10-04 – 2014-10-10 (×3): 50 mg via ORAL
  Filled 2014-10-02 (×4): qty 1

## 2014-10-02 MED ORDER — POTASSIUM CHLORIDE CRYS CR 10 MEQ OR TBCR
60.00 meq | EXTENDED_RELEASE_TABLET | Freq: Once | ORAL | Status: AC
Start: 2014-10-02 — End: 2014-10-02
  Administered 2014-10-02: 60 meq via ORAL
  Filled 2014-10-02: qty 6

## 2014-10-02 MED ORDER — VANCOMYCIN HCL 500 MG IV SOLR
500.0000 mg | Freq: Four times a day (QID) | INTRAVENOUS | Status: DC
Start: 2014-10-02 — End: 2014-10-03
  Administered 2014-10-02: 500 mg via RECTAL
  Filled 2014-10-02 (×5): qty 500

## 2014-10-02 MED ORDER — LIDOCAINE, BUFFERED 1% IJ SOLN (COMPOUNDED)
INTRADERMAL | Status: DC | PRN
Start: 2014-10-02 — End: 2014-10-02
  Administered 2014-10-02: 5 mL via INTRADERMAL

## 2014-10-02 MED ORDER — VANCOMYCIN 50 MG/ML ORAL SOLN (COMPOUNDED) (~~LOC~~)
125.0000 mg | Freq: Four times a day (QID) | ORAL | Status: DC
Start: 2014-10-02 — End: 2014-10-03
  Administered 2014-10-02 (×3): 125 mg via ORAL
  Filled 2014-10-02 (×6): qty 2.5

## 2014-10-02 MED ORDER — SIMETHICONE 80 MG OR CHEW
80.0000 mg | CHEWABLE_TABLET | Freq: Four times a day (QID) | ORAL | Status: DC | PRN
Start: 2014-10-02 — End: 2014-10-14
  Administered 2014-10-02 – 2014-10-09 (×2): 80 mg via ORAL
  Filled 2014-10-02 (×2): qty 1

## 2014-10-02 SURGICAL SUPPLY — 3 items
CATHETER CENTESIS 1-STEP 4FR 7CM (Lines/Drains) ×2 IMPLANT
PREP CHLOROPREP 10.5ML 1-STEP (Misc Medical Supply) ×2 IMPLANT
TRAY THORACENTESIS (Kits/Sets/Trays) ×2 IMPLANT

## 2014-10-02 NOTE — Progress Notes (Signed)
Inpatient Medicine Progress Note       Interval Events/Subjective:   Complained of increased abd pain overnight, found to have dilated colon on axr.  Today cont to c/o abd pain, nausea, and diarrhea.    Vitals signs:     Latest Entry  Range (last 24 hours)    Temperature: 98.8 F (37.1 C)  Temp  Avg: 98.7 F (37.1 C)  Min: 98.4 F (36.9 C)  Max: 99 F (37.2 C)    Blood pressure (BP): 140/65 mmHg  BP  Min: 124/33  Max: 152/60    Heart Rate: 105  Pulse  Avg: 103.7  Min: 94  Max: 112    Respirations: 22  Resp  Avg: 18.5  Min: 16  Max: 22    SpO2: 96 %  SpO2  Avg: 98 %  Min: 96 %  Max: 100 %       No Data Recorded     Yesterday's intake and output:  02/21 0600 - 02/22 0559  In: 667 [P.O.:337; I.V.:250]  Out: -     Physical Exam  GENERAL:  Thin appearing woman in NAD.  Pleasant and cooperative.     HEENT: EOMI. Oropharynx appears moist. No LAD.   CARDIOVASCULAR:  Tachycardic and irregular rhythm.   LUNGS: CTAB  ABDOMEN: Soft, non-distended. Normoactive bowel sounds. Non-tender  EXTREMITIES: Warm and well perfused.   No asterixis     Labs seen and reviewed.     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     #AMS: multifactorial, contribution of HE + infection with norovirus and now possibly UTI. Improving.   -continue lactulose, rifaximin   -abx treatment per below   -frequent re-orientation, day/night cycle   -if continues to improve, will defer downsizing of TIPS     #HCV cirrhosis: status post TIPS (06/2014) with recent revision no 09/14/14. Abdominal ultrasound on admission shows patency of vessels.   --Hepatic encephalopathy: continue lactulose/rifaximin, Improving. If continues to improve, can defer downsizing of TIPS given that downsizing will likely lead to recurrent re  accumulating HHT.   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   --Ascites: lasix 20/aldactone 50. S/p TIPS   Recurrent hepatic hydrothorax: will ask IR to perform thoracentesis with studies on Monday.  --HCC: CT 06/2014 without lesions c/f HCC, repeat due in May.     #Afib: Initially in RVR but HR now better controlled on oral diltiazem  -continue diltiazem, change to ER once dose determined   -Previous team had extensive discussion with patient and family regarding risk/benefits of anticoagulation and family wanted to pursue [facility] with xarelto however will defer beginning ac at this time given patient is at a high fall risk in setting of acute encephalopathy. Patient and family expressed agreement with this plan and discussion regarding ac will be revisited pending improvement in her encephalopathy.     #leukocytosis: UA yesterday suggestive of UTI though sample with many squames. CXR yesterday with worsened pleural effusion but no focal consolidation seen.    -cont empiric CTX for possible UTI, repeat UA, follow up urine culture.   -f/u blood clx  -thoracentesis tomorrow with studies.   -diagnostic para, if ascites present  -start empiric  tx for cdiff d/t colonic distention on axr and diarrhea. Repeat cdiff and GI panel     #norovirus: diarrhea resolved.   --Hydration, encourage oral intake, and also contact precautions    #DM2: not well controlled   --will increase lantus to 35units and 16units lispro qac per endocrine recs  --add insulin coverage for glucerna supplements   --high intensity SSI     #Osteoporosis  --Continue home calcium and vitamin D    #GERD:   --Continue pepcid 20 mg daily    # moderate malnutrition: appreciate nutrition recs. continue Carb Limited Diet. ADD 2gm Na restriction + snacks BID + Glucerna TID     VTE PPx: SQ heparin   CODE : Full Code  DISPO: Pending clinical improvement.

## 2014-10-02 NOTE — Plan of Care (Signed)
Problem: Falls, Risk of  Goal: Keep patient free from falls utilizing universal fall precautions  Outcome: Met  Patient bed in lowest position, bed alarm on, fall risk band on, call light within reach. Nonskid sock on. Fall precautions in place.     Problem: Pain - Acute  Goal: Communication of presence of pain  Outcome: Met  Patient denies pain or discomfort at this time, resting comfortably in bed. Daughter at bedside.     Problem: Alteration in Blood Glucose  Goal: Glucose level within specified parameters  Outcome: Not Met  Blood sugar in high 200s. MDs adjusting insulin as needed. Will monitor.     Problem: Skin Integrity- Intact patient high risk for impaired skin integrity Braden scale less than or equal to 18  Goal: Skin remains intact  Outcome: Met  Patient at risk for skin breakdown related to immobility and frequent stooling. Patient repositioned minimum q2h. Pericare provided as needed. Coccyx dressing in place for protection. Heels elevated off bed will monitor.

## 2014-10-02 NOTE — Progress Notes (Signed)
ATTENDING PROGRESS NOTE ATTESTATION  Interval history:  Abdominal pain, colonic ileus  See fellow's history and physical for further details of the patient's history.    Objective  I have examined the patient and concur with the fellow's exam.  Labs were reviewed.  Wbc 17K    Assessment and Plan  I agree with the fellow's care plan.  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 Issues with noncompliance as well as norovirus infection. GIven her rapid improvement of hepatic encephalopathy with rifaximin and lactulose, will defer downsizing the transjugular intrahepatic portosystemic shunt.   -- wbc to 17K. On CTX for UTI by UA since 2/21. Colonic ileus to 7.9. ?Cdiff. Has pain and very tympanic. Will treat empirically for cdiff with vanc PR and falgyl IV while awaiting for repeat Cdiff and NAT.   -- colonic ileus: rx as above for ?cdiff. Would NPO. Repeat KUB this afternoon to make sure not worsening. Correct electrolytes. Currently passing gas + diarrhea.   -- afib: if mental status stable over a few weeks, can consider anticoag for afib. would not start it if she continues to have episode of hepatic encephalopathy making her a high fall risk.   -- HHthorax: IR thora today    See the fellow's history and physical for further details.  [name] [place], [place] [id number]

## 2014-10-02 NOTE — Plan of Care (Signed)
Problem: Aspiration, Risk of  Goal: Evidence of swallowing difficulties is absent  Outcome: Met  Provided pm meds crushed in liquid, tolerated well w/ no noted s/s of aspiration, reported to [employer].

## 2014-10-02 NOTE — Progress Notes (Signed)
Endocrine/Diabetes Progress Note    Overnight:  No acute events overnight     Subjective: Poor appetite; family at bedside and food brought from home; pt may eat it later    Objective:   Temperature:  [98.4 F (36.9 C)-99 F (37.2 C)] 98.8 F (37.1 C) (02/22 0926)  Blood pressure (BP): (124-148)/(31-67) 126/55 mmHg (02/22 1245)  Heart Rate:  [94-127] 127 (02/22 1245)  Respirations:  [16-22] 20 (02/22 0926)  Pain Score:  [-] 7 (02/22 0926)  O2 Device:  [-] None (Room air) (02/22 0926)  SpO2:  [95 %-100 %] 95 % (02/22 1245)  Body mass index is 21.38 kg/(m^2).    GEN: NAD, awake, chronically ill appearing  PULM: breathing comfortably    Diet: was Carb limited; thickened liquids- Nectar...currently sips and ice chips + glucerna TID    Blood Sugars reviewed:        AM  Lu  Di  HS  O/N    02.21  [phone]  Lantus   35  Lispro  0+6  0+6+10  0+3+10   +6    02.22  192  Lantus  Lispro  0+6+4     A/P:  Paula Manning is a 74 year old female with uncontrolled DM2 c/b neuropathy as well as HepC cirrhosis, HTN, glaucoma and migraines admitted 09/16/2014 w/ n/v and AMS.     BGs remain elevated in the 200-300s, will increase nutritional insulin.  Family also brought in food including rice and veggies for pt to eat later.  OK from DM standpoint, but must communicate w/ RN to coordinate timing of BG check and to estimate amt carb eaten from home food.  RN to page DM RN for assistance w/ carb counting home food.    Inpatient Recs:(paged to 1st call)  - continue lantus  35 units qday  - increase lispro from 12-->16 units qac, RN to give 0, 1/2, whole dose based on % tray consumed  - increase Lispro 6-->8 units per can of Glucerna  - continue high Lispro correction scale q ac/hs    Outpatient Recs:  Was on Lantus 20 units qhs, Januvia 50 mg qday and glipizide 2.5 mg bid w/ A1C 6.2%, but home BG 200-400s in setting of OJ-carrot juice and ++rice/beans intake. Final doses TBD based on inpatient needs.    will cont to  follow

## 2014-10-02 NOTE — Interdisciplinary (Signed)
PT Daily Treatment Note    Discharge Information:     PHYSICAL THERAPY DISCHARGE RECOMMENDATIONS  Discharge Physical Therapy and equipment needs: Patient will benefit from continued skilled Physical Therapy  Patient is appropriate for discharge to: a supervised living situation  Patient has the following deficits that may restrict safe completion of mobility activities of daily living skills: physical limitations  Patient functional limitations necessitate consideration of the following assistive device to complete mobility activities of daily living safely: [place]    Subjective:  RLE: No Precautions  LLE: No Precautions  RUE: No Precautions  LUE: No Precautions  Spine orthotic: None  Additional  Precautions/Contrandications:: Fall Risk       Pain Score (0-10): 0  Pain Location: Denies pain    Status: Agreeable to treatment;RN ok'd treatment     Treatment Today  Therapeutic Activities [97530]: x2  Therex [97110]: x1                                                                                                                        Objective:   Objective Findings: Patient recieved reclined in bed, family in room.  Bed Mobility- minA supine > sit at EOB.  Static sitting- fair/fair minus balance, patient with posterior trunk lean and requires modA to midline trunk orientation.  Transfers- modA sit <> stand with FWW.  Static standing balance- fair minus standing balance with incresed posterior trunk lean.  Gait Training- modA x2 persons 3 feet, patient with poor safety awareness and balance, modA x2 for chair transfer.  Therapeutic Exercises- ankle pumps, LAQ, marching 15x each LE.  Patient tolerated ~20 minutes of sitting at edge of bed.  Transfer- chair > bed with FWW and modA x2 persons, patient with poor safety awareness and required 2 person modA for safe transfer.  Bed Mobility- minA sit > supine.  Patient ended treatment session reclined in bed, all needs in reach, RN aware.      Assessment:  Assessment: Patient participates well with therapy.  She is able to take steps to chair with FWW and 2 person assistance.  She presents with LE weakness, impaired trunk control, decreased balance, and decreased safety awareness during transfers.  She is a high fall risk at this time and not safe to be left in chair without supervision.  Anticipate patient will benefit from a supervised living environment upon discharge, family ageeable.  PT to continue services to address listed impairments and maximize functional independence.        Plan:    PHYSICAL THERAPY PATIENT DISCHARGE INSTRUCTIONS  Your Physical Therapist suggests the following: Continue to complete your home exercise program daily as instructed;Supervision with walking is suggested for increased safety;Continue to use your assistive device as instructed when walking to improve your stability and prevent falls  The following assistive device has been recommended for your safety: [place] therapy for Diagnosis: Difficulty in walking (dec activity tolerance ) 719.7  Focus  for Next Treatment: Gait training;Functional mobility training;Balance activities;Graded tasks to increase function;Safety instruction;Therapeutic exercise  Frequency: 4-7 times/wk    Total Treatment Time (min): 40  Total Timed Treatment (min) : 40

## 2014-10-02 NOTE — Plan of Care (Signed)
Problem: Discharge Planning  Goal: Participation in care planning  Outcome: Met  Pt and family actively participating in [place].  Goal: Patient is at risk for readmission- risk factors have been addressed  Outcome: Met  Discussed readmission risks w/ pt and family, voice understanding of same.    Problem: Falls, Risk of  Goal: Keep patient free from falls utilizing universal fall precautions  Outcome: Met  No falls.  Goal: Ensure safe mobility of patient (Mobility)  Outcome: Met  PT/OT in to work w/ pt, see interdisciplinary note.  Goal: Minimize the risk of falls due to the effects of prescribed medications (Medications)  Outcome: Met  No effects noted 2/2 prescribed meds.    Problem: Tissue Perfusion, Cardiopulmonary - Altered  Goal: Hemodynamic stability  Outcome: Met  Vss, see doc flow sheet.  Goal: Early recognition of deterioration  Outcome: Met  No noted s/s of deterioration.    Problem: Pain - Acute  Goal: Communication of presence of pain  Outcome: Met  Provided simethicone for abd discomfort, voiced complete relief w/ same.  Goal: Control of acute pain  Outcome: Met  See previous.    Problem: Alteration in Blood Glucose  Goal: Glucose level within specified parameters  Outcome: Met  Insulin adjustment orders and diet change to sips and chips noted, blood sugar noted to becoming more controlled, insulin provided per orders, see doc flow sheet and mar.    Problem: Infection  Goal: Absence of infection signs and symptoms  Outcome: Met  No noted s/s of infection.    Problem: Skin Integrity- Intact patient high risk for impaired skin integrity Braden scale less than or equal to 18  Goal: Skin remains intact  Outcome: Met  Small skin tear noted to right fa upon return from IR, minimal drainage, mepilex place.    Problem: Aspiration, Risk of  Goal: Evidence of swallowing difficulties is absent  Outcome: Met  No noted s/s of swallow difficulties.

## 2014-10-02 NOTE — Plan of Care (Signed)
Problem: Pain - Acute  Goal: Communication of presence of pain  Outcome: Met  Received report from rn Heather, pt awake in bed w/ no noted distress or complaints, upon inquiry voiced some abd discomfort, informed pt that dr would be notified of same, family remains at bedside, pt and family cooperative, call button w/in reach, voice understanding and plan to use same for assist.

## 2014-10-02 NOTE — Interdisciplinary (Addendum)
OT Daily Treatment Note    Subjective:  RLE: No Precautions  LLE: No Precautions  RUE: No Precautions  LUE: No Precautions  Additional  Precautions/Contrandications:: Fall Risk    Pain Location: no reports of pain    Status: Agreeable to treatment;RN ok'd treatment    Treatment Today  ADL Training [97535]: x1  Therapeutic Activities [97530]: x1         Objective:  Objective Findings: Pt received supine in bed with family present.  Pt min A to the edge of bed with verbal cues for hand placement for edge of bed sitting balance. Pt mod A with FWW to a chair at bedside with verbal cues for safe technique with functional transfers as pt attempted to sit prior to being close to chair/bed. Pt participated in seated UE ROM exercises including shoulder horizontal abduction/adduction and flex/ext. Pt with limited ROM on left shoulder from previous injury. Pt with functional range in UE's to comb hair, wash face and change hospital gown while seated in the chair. Pt fatigued and returned to bed with mod A using FWW. Pt in bed with family present, call light in reach and RN aware of status.    Assessment:  Assessment: Pt progressing with OOB activity today with mod A for safety. Pt continues to be limited by decreased activity tolerance and fear of falling. Pt was encouraged to increase OOB activity with nursing assistance to a bedside commode and chair for meals. Currently recommending assistance with ADL's/functional mobility with further rehab upon discharge.  Equipment Recommendations: to be determined as patient progresses with therapy;3-in-1 commode    Plan:  Continue therapy for Diagnosis: Difficulty in walking (dec activity tolerance ) 719.7  Frequency: 4-7 times/wk  Focus for Next Treatment: ADL training ;Functional mobility training for ADLs;Balance activities;Graded tasks to increase function;Safety instruction;Family training ;Patient exercise program instruction    Total Treatment Time (min): 30  Total Timed  Treatment (min) : 30

## 2014-10-02 NOTE — Plan of Care (Signed)
Patient complains of abdominal pain and distention. Bowel sounds present. Called 1st to call, informed of findings. KUB ordered.

## 2014-10-02 NOTE — Progress Notes (Signed)
Hepatology Progress Note  Consult Attending: Dr. Marykay Lex, MD    24h events:   -pt with abdominal pain and distention today, on the kub with likely colonic ileus    Past Medical History:  Past Medical History   Diagnosis Date    Migraine headache     Glaucoma     Ascites 06/29/2007     History of paracentesis x 2    Pancreatic cyst 06/29/2007    Cirrhosis ([place]) 06/24/2007     Active on the liver transplant waiting list with a blood type of O positive. 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 Due to Hep C    Osteopenia 06/24/2007    Emphysema 06/24/2007    EV (esophageal varices) ([place]) 06/10/2007    Anemia 06/10/2007    HCV (hepatitis C virus) 06/10/2007     Achieved SVR.    Type II or unspecified type diabetes mellitus with peripheral circulatory disorders, not stated as uncontrolled(250.70) 06/10/2007    HTN 06/10/2007    HTN 06/10/2007       Past Surgical History:   Past Surgical History   Procedure Laterality Date    Pb laps surg cholecstc w/expl common duct  1970s     in [place];  open CCX    Pb cesarean delivery only      Pb cstoplasty/cstourtp plstc any       Bladder suspension    Egd procedure  06/01/2013     Procedure: GI EGD;  Surgeon: Jeanette Caprice, MD;  Location: [facility];  Service: Gastroenterology;;    Tips procedure         Allergies:  No Known Allergies    Home Medications:  Prescriptions prior to admission   Medication Sig Dispense Refill Last Dose    alendronate (FOSAMAX) 70 MG tablet    09/22/2014    ALPHAGAN P 0.1 % ophthalmic solution    09/22/2014    bacitracin 500 UNIT/GM ointment Apply small amount to affected area on face twice daily 1 Tube 1 09/22/2014    CALCIUM CARBONATE 600 MG OR TABS 1 tablet 2 times daily one month 11 09/22/2014    ciprofloxacin (CIPRO) 500 MG tablet Take 1 tablet by mouth daily. 30 tablet 5 09/22/2014    furosemide (LASIX) 20 MG tablet Take 2 tablets by mouth daily. 90 tablet 0 09/22/2014    glipiZIDE (GLUCOTROL) 5 MG tablet Take 0.5  tablets (2.5 mg) by mouth 2 times daily. 30 tablet 1 09/22/2014    insulin glargine (LANTUS) 100 UNIT/ML injection Inject 20 Units under the skin daily. 10 mL 1 09/22/2014    lactulose 10 GM/15ML solution Take 30 mLs (20 g) by mouth 3 times daily. 946 mL 0 09/22/2014    LUMIGAN 0.01 %    09/22/2014    multivitamin (MULTI-VITAMIN) tablet Take 1 tablet by mouth daily.   09/22/2014    ranitidine (ZANTAC) 300 MG tablet Take 300 mg by mouth 2 times daily.   09/22/2014    rifaximin (XIFAXAN) 550 MG tablet Take 550 mg by mouth every 12 hours.   09/22/2014    sitagliptin (JANUVIA) 50 MG tablet Take 1 tablet (50 mg) by mouth daily. 60 tablet 1 09/22/2014    spironolactone (ALDACTONE) 50 MG tablet Take 1 tablet (50 mg) by mouth daily. 30 tablet 0 09/22/2014    VITAMIN D 400 UNIT OR CAPS 1 CAPSULE DAILY 30 11 09/22/2014       Inpatient Medications:  Scheduled Meds   cefTRIAXone (ROCEPHIN) IVPB  1,000 mg Q24H NR    cholecalciferol  400 Units Daily    diltiazem  45 mg Q6H    famotidine  20 mg Daily    furosemide  20 mg Daily    insulin glargine  35 Units Daily    insulin lispro  1-10 Units 4x Daily WC    insulin lispro  16 Units TID WC    insulin lispro  8 Units TID WC    lactulose  20 g BID    metroNIDAZOLE  500 mg Q8H    multivitamin  1 tablet Daily    rifaximin  550 mg Q12H    sodium chloride (PF)  3 mL Q8H    spironolactone  50 mg Daily    vancomycin (VANCOCIN) rectal enema  500 mg 4x Daily    vancomycin  125 mg 4x Daily     IV Meds   sodium chloride       Social History:   Lives with husband. Has 10 children. Was a homemaker.  Denies alcohol, drugs, tobacco.     Review of Systems: -   Constitutional: Denies fevers, chills, weight loss  ENT: negative for eye changes, oral lesions, difficulty swallowing  Cardiac: Denies chest pain or palpitations  Lungs: Denies cough or shortness of breath  GI: Negative except noted in the HPI  GU: Denies dysuria, hematuria  Skin: Denies rashes or other skin lesions  MSK:  Denies joint deformities, new joint pain  Lymph: Denies enlarged nodes, night sweats, abnormal bleeding  Psych: negative for depressed mood, anxiety  Neuro: negative for confusion, weakness, sensory changes    OBJECTIVE:   Vitals signs:   Latest Entry  Range (last 24 hours)    Temperature: 98.4 F (36.9 C)  Temp  Avg: 98.7 F (37.1 C)  Min: 98.4 F (36.9 C)  Max: 99 F (37.2 C)    Blood pressure (BP): 124/53 mmHg  BP  Min: 114/57  Max: 148/67    Heart Rate: 107  Pulse  Avg: 111.3  Min: 105  Max: 127    Respirations: 20  Resp  Avg: 19.2  Min: 16  Max: 22    SpO2: 100 %  SpO2  Avg: 97.8 %  Min: 95 %  Max: 100 %       No Data Recorded     Weight - scale: 51.3 kg (113 lb 1.5 oz)  Percentage Weight Change (%): 0.59 %    Intake/Output       10/01/14 0600 - 09/16/2014 0559 10/01/2014 0600 - 10/03/14 0559      [id number] 5170-0174 Total 0600-1759 9449-6759 Total       Intake    P.O.  237  100 337  --  -- --    I.V.  250  -- 250  150  -- 150    NG/GT  80  -- 80  --  -- --    Total Intake 567 100 667 150 -- 150       Output    Other  --  -- --  1000  -- 1000    Total Output -- -- -- 1000 -- 1000       Net I/O     567 100 667 -850 -- -850        Respiratory support:  Oxygen Therapy  SpO2: 100 %  O2 Device: None (Room air)  O2 Flow Rate (L/min): 2 l/min     Physical Exam:  General:  Well developed,  thin elderly Hispanic female in no apparent distress, somnolent, a&o to person and place (never oriented to date per son)  HEENT:  No scleral icterus, Trachea midline, mucus membranes moist.  Lungs:  Diminished BS at R base  Cardiovascular:  Irregularly irregular  Abdomen:  Abdomen tense, tympanic, tender to palpation diffusely, bowel sound present. No peritoneal signs.  Extremities:  Trace peripheral edema RLE > LLE. Warm well-perfused.  Skin:  Non-icteric and no visible rash  Neuro:  Cranial nerves II-XII grossly intact. Mild asterixis    Labs:     CBC  Recent Labs      10/01/14   0550  09/27/2014   0537   WBC  11.7*  17.9*   HGB   9.4*  9.6*   HCT  27.7*  26.9*   PLT  79*  95*   SEG  86*  87*   LYMPHS  8*  7*   MONOS  5  6      Chemistry  Recent Labs      10/01/14   0550  09/20/2014   0537   NA  141  139   K  3.3*  3.0*   CL  108*  106   BICARB  17*  18*   BUN  25*  22*   CREAT  0.66  0.66   GLU  281*  163*     8.6  8.4*   MG  2.1  2.1   PHOS  2.3*  2.7     Recent Labs      10/01/14   0550  09/15/2014   0537   ALK  82  92   AST  18  21   ALT  18  20   TBILI  4.22*  4.01*   ALB  3.1*  3.1*        Coags  Recent Labs      10/01/14   0550  09/18/2014   0537   PT  19.4*  22.5*   INR  1.8  2.1       Radiology:   CT A/P w/ contrast 09/25/14: FINDINGS:  LUNG BASES: Small to moderate right pleural effusion with right lower lobe   atelectasis  LIVER/BILIARY:Diffuse nodularity and hepatic atrophy. Stable scattered tiny   low attenuation lesions in the liver.  PANCREAS: Stable numerous pancreatic cysts.  SPLEEN: Unremarkable.  ADRENAL GLANDS: Unremarkable  KIDNEYS: Stable left renal cyst.  STOMACH/DUODENUM: Mild wall thickening from the distal stomach to the proximal  jejunum  VASCULATURE: Patent TIPS. Stable nonocclusive thrombus of the main portal vein  and superior mesenteric vein. New thrombosis of the left portal vein.  LYMPHATIC: Unremarkable  SMALL & LARGE BOWEL: Unremarkable  BLADDER/PELVIC ORGANS: Unremarkable  BONES/SOFT TISSUES: Unremarkable  OTHER: Trace pelvic ascites    IMPRESSION:  1) New thrombosis of the left portal vein. Vessel was patent on prior   abdominal ultrasound 09/18/2014  2) Edema of the distal stomach through proximal jejunum suggestive of   enteritis, nonspecific    CT head 09/19/2014: IMPRESSION:  1. No evidence of acute intracranial hemorrhage or findings to suggest   territorial/transcortical infarct.  2. Mild white matter changes. These may be associated with prior small vessel   ischemia. If there is concern for encephalopathy, MRI would be more helpful   for acute evaluation. MRI screening: No intracranial or intraorbital  metal.  3. Frothy secretions within the right sphenoid sinuses well as reactive bone   changes, suggestive of  acute on chronic sinusitis.    RUQ U/S 10/03/2014: FINDINGS:  The liver measures 11.3 cm in long axis. It is normal in echogenicity. No   focal lesions are identified. There is no intra or extra hepatic bile duct   dilation. The common bile duct measures four mm. The gallbladder is not   visualized. A patent TIPS catheter is present. The TIPS velocities are: Portal  end 115 ; mid 93 ; hepatic 125 cm/s. Flow in the right and left portal veins   is hepatofugal and towards the TIPS ; flow in the main portal vein is   hepatopetal.  The right kidney measures nine cm in long axis and is within normal limits   with no hydronephrosis or renal calculi.  The visualized portion of the pancreas again demonstrates multiple cystic   lesions throughout the pancreas, as seen on prior imaging  The visualized aorta and inferior vena cava are within normal limits.  Right pleural effusion. No evidence of intra abdominal ascites.    IMPRESSION:  Patent TIPS catheter with normal velocities and appropriate flow in portal   veins.  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      Prior Endoscopy:   EGD 05/2013:  Findings: 1. Midline orophyarngeal prominence at the  arytenoid  2. Three columnns of grade 1 esophageal varices, no high  risk features  3. No gastric varices, mild portal hypertensive gastropathy  4. Few punctate clots in the antrum  5. Mild duodenal congestion consistent with portal  hypertension  Endoscopic Diagnosis: 1. Midline orophyarngeal  prominence at the arytenoid  2. Three columnns of grade 1 esophageal varices, no high  risk features  3. No gastric varices, mild portal hypertensive gastropathy  4. Few punctate clots in the antrum  5. 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 Continue propranolol  10 BID  2. ENT consultation to evaluate oropharyngeal findings  3. 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 Confusion associated with poor medication compliance, including lactulose, N/V, abd pain, diarrhea, and f/c/s. HDS. Work-up here showed GI pathogen panel with norovirus and recent UA consistent with UTI.     #AMS: Improving, but still with intermittent confusion. Likely precipitated by infection from UTI and norovirus infection.     -started on CTX for UTI, mont WBC after starting CTX, f/u Ucx  -cont supportive care for norovirus  -deferring previous plans to downsize TIPS given several infectious precipitants and TIPS downsizing will increase rate of pleural effusion accumulation.    # Abdominal pain  Patient with increased abdominal pain and bloating today. X ray found with colonic ileus, up 7.9 cm colon. We need to rule out c diff and treat empirically. Also pt with leukocytosis.  - vancomycin PR and flagyl IV for possible c diff infection  - NPO  - c diff testing and stool cultures  - frequent abdominal exams  - repeat imaging with KUB tomorrow am if not improvement     #)Norovirus: likely responsible for initial  N/V, abd pain and diarrhea on presentation  - CT A/P w/out acute pathology  - Follow up blood cultures: NGTD  - Supportive care with IVF and anti-emetics    #)Afib w rvr  S/p diltiazem, now rate controlled. CHADS score 2, chads vsc 4. High risk of fall due to her hepatic encephalopathy, highly concerned for the possibility of anticoagulation. We would hold anticoagulation at this time. Patient also will be evaluated from IR for  possible downsizing her transjugular intrahepatic portosystemic shunt, as her MS not yet back to her baseline.  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/2014 and TIPS revision 09/14/14 MELD 20    * HE: still encephalopathic but improved, likely related to infection and TIPS revision,    - Continue lactulose enemas titrate for 3-4 soft bowel movements a day and rifaximin  * Ascites: No ascites on recent U/S, continue to hold her diuretics  * EV's: S/p TIPS, no longer needs surveillance if TIPS is patent and she is stable  * HHT: S/p TIPS and TIPS revision   - RUQ U/S 2/13 with patent TIPS   - diuretics lasix 20, aldactone 50   - plan for repeat dx and therapeutic thora with VIR today  * Non-occlusive portal vein thrombosis: Multiple outpatient notes indicate poor medication adherence/knowledge, remains fall risk as well.  Risks outweigh benefits  * Eads surveillance: Last CT on 06/27/2014 showed no HCC. Repeat imaging in May 2016.  * Transplant: Age >70, not a candidate    DWA Dr. Lilly Cove, MD  [facility]

## 2014-10-03 DIAGNOSIS — R109 Unspecified abdominal pain: Secondary | ICD-10-CM

## 2014-10-03 DIAGNOSIS — R05 Cough: Secondary | ICD-10-CM

## 2014-10-03 LAB — COMPREHENSIVE METABOLIC PANEL, BLOOD
ALT (SGPT): 21 U/L (ref 0–33)
AST (SGOT): 20 U/L (ref 0–32)
Albumin: 2.8 g/dL — ABNORMAL LOW (ref 3.5–5.2)
Alkaline Phos: 100 U/L (ref 35–140)
Anion Gap: 13 mmol/L (ref 7–15)
BUN: 24 mg/dL — ABNORMAL HIGH (ref 6–20)
Bicarbonate: 17 mmol/L — ABNORMAL LOW (ref 22–29)
Bilirubin, Tot: 3.43 mg/dL — ABNORMAL HIGH (ref <1.20)
Calcium: 8.2 mg/dL — ABNORMAL LOW (ref 8.5–10.6)
Chloride: 109 mmol/L — ABNORMAL HIGH (ref 98–107)
Creatinine: 0.76 mg/dL (ref 0.51–0.95)
GFR: >60 mL/min
Glucose: 98 mg/dL (ref 70–115)
Potassium: 3 mmol/L — ABNORMAL LOW (ref 3.5–5.1)
Sodium: 139 mmol/L (ref 136–145)
Total Protein: 5.3 g/dL — ABNORMAL LOW (ref 6.0–8.0)

## 2014-10-03 LAB — CBC WITH DIFF, BLOOD
ANC-Automated: 19.1 10*3/uL — ABNORMAL HIGH (ref 1.6–7.0)
Abs Lymphs: 0.9 10*3/uL (ref 0.8–3.1)
Abs Monos: 1.5 10*3/uL — ABNORMAL HIGH (ref 0.2–0.8)
Hct: 27.2 % — ABNORMAL LOW (ref 34.0–45.0)
Hgb: 9.5 gm/dL — ABNORMAL LOW (ref 11.2–15.7)
Imm Gran %: 1 % (ref <1)
Imm Gran Abs: 0.1 10*3/uL (ref <0.1)
Lymphocytes: 4 % — ABNORMAL LOW (ref 19–53)
MCH: 33.9 pg — ABNORMAL HIGH (ref 26.0–32.0)
MCHC: 34.9 % (ref 32.0–36.0)
MCV: 97.1 um3 — ABNORMAL HIGH (ref 79.0–95.0)
MPV: 9.8 fL (ref 9.4–12.4)
Monocytes: 7 % (ref 5–12)
Plt Count: 108 10*3/uL — ABNORMAL LOW (ref 140–370)
RBC: 2.8 10*6/uL — ABNORMAL LOW (ref 3.90–5.20)
RDW: 20.2 % — ABNORMAL HIGH (ref 12.0–14.0)
Segs: 88 % — ABNORMAL HIGH (ref 34–71)
WBC: 21.6 10*3/uL — ABNORMAL HIGH (ref 4.0–10.0)

## 2014-10-03 LAB — BASIC METABOLIC PANEL, BLOOD
Anion Gap: 18 mmol/L — ABNORMAL HIGH (ref 7–15)
BUN: 26 mg/dL — ABNORMAL HIGH (ref 6–20)
Bicarbonate: 13 mmol/L — ABNORMAL LOW (ref 22–29)
Calcium: 8.5 mg/dL (ref 8.5–10.6)
Chloride: 102 mmol/L (ref 98–107)
Creatinine: 0.77 mg/dL (ref 0.51–0.95)
GFR: >60 mL/min
Glucose: 149 mg/dL — ABNORMAL HIGH (ref 70–115)
Potassium: 3.7 mmol/L (ref 3.5–5.1)
Sodium: 133 mmol/L — ABNORMAL LOW (ref 136–145)

## 2014-10-03 LAB — TOTAL PROTEIN, OTHER: Total Protein, Other: 2.2 g/dL

## 2014-10-03 LAB — PH, OTHER: Ph, Other: 7.46

## 2014-10-03 LAB — C.DIFFICILE TOXIN PCR, STOOL: C.Difficile Toxin, PCR: NOT DETECTED

## 2014-10-03 LAB — GLUCOSE (POCT)
Glucose (POCT): 105 mg/dL (ref 70–115)
Glucose (POCT): 159 mg/dL — ABNORMAL HIGH (ref 70–115)
Glucose (POCT): 140 mg/dL — ABNORMAL HIGH (ref 70–115)
Glucose (POCT): 160 mg/dL — ABNORMAL HIGH (ref 70–115)

## 2014-10-03 LAB — LDH, OTHER: LDH, Other: 110 IU/L

## 2014-10-03 LAB — PROTHROMBIN TIME, BLOOD
INR: 2.1
PT,Patient: 23.3 s — ABNORMAL HIGH (ref 9.7–12.5)

## 2014-10-03 LAB — MAGNESIUM, BLOOD: Magnesium: 2.4 mg/dL (ref 1.7–2.6)

## 2014-10-03 LAB — PHOSPHORUS, BLOOD: Phosphorous: 2.5 mg/dL — ABNORMAL LOW (ref 2.7–4.5)

## 2014-10-03 MED ORDER — MULTI-DELYN OR LIQD
5.0000 mL | Freq: Every day | ORAL | Status: DC
Start: 2014-10-03 — End: 2014-10-14
  Administered 2014-10-03 – 2014-10-14 (×12): 5 mL via ORAL
  Filled 2014-10-03 (×12): qty 5

## 2014-10-03 MED ORDER — SODIUM CHLORIDE 0.9 % IV SOLN
3375.0000 mg | Freq: Three times a day (TID) | INTRAVENOUS | Status: DC
Start: 2014-10-03 — End: 2014-10-08
  Administered 2014-10-03 – 2014-10-08 (×15): 3375 mg via INTRAVENOUS
  Filled 2014-10-03 (×14): qty 3375

## 2014-10-03 MED ORDER — SODIUM CHLORIDE 0.9 % IV SOLN
1000.00 mg | Freq: Once | INTRAVENOUS | Status: AC
Start: 2014-10-03 — End: 2014-10-03
  Administered 2014-10-03: 1000 mg via INTRAVENOUS
  Filled 2014-10-03: qty 1000

## 2014-10-03 MED ORDER — ALBUMIN HUMAN 25 % IV SOLN
75.00 g | Freq: Once | INTRAVENOUS | Status: AC
Start: 2014-10-03 — End: 2014-10-03
  Administered 2014-10-03: 75 g via INTRAVENOUS
  Filled 2014-10-03: qty 300

## 2014-10-03 MED ORDER — POTASSIUM CHLORIDE 10 MEQ/100ML IV SOLN
10.00 meq | Freq: Once | INTRAVENOUS | Status: AC
Start: 2014-10-03 — End: 2014-10-03
  Administered 2014-10-03: 10 meq via INTRAVENOUS
  Filled 2014-10-03: qty 100

## 2014-10-03 MED ORDER — POTASSIUM CHLORIDE CRYS CR 10 MEQ OR TBCR
60.0000 meq | EXTENDED_RELEASE_TABLET | Freq: Once | ORAL | Status: DC
Start: 2014-10-03 — End: 2014-10-03

## 2014-10-03 MED ORDER — VANCOMYCIN HCL 500 MG IV SOLR
500.0000 mg | Freq: Three times a day (TID) | INTRAVENOUS | Status: DC
Start: 2014-10-03 — End: 2014-10-06
  Administered 2014-10-03 – 2014-10-06 (×9): 500 mg via INTRAVENOUS
  Filled 2014-10-03 (×9): qty 500

## 2014-10-03 MED ORDER — METRONIDAZOLE IN NACL 5-0.79 MG/ML-% IV SOLN
500.0000 mg | Freq: Three times a day (TID) | INTRAVENOUS | Status: DC
Start: 2014-10-03 — End: 2014-10-04
  Administered 2014-10-03 – 2014-10-04 (×3): 500 mg via INTRAVENOUS
  Filled 2014-10-03 (×3): qty 100

## 2014-10-03 MED ORDER — POTASSIUM CHLORIDE 20 MEQ/15ML (10%) OR SOLN
30.00 meq | ORAL | Status: AC
Start: 2014-10-03 — End: 2014-10-03
  Administered 2014-10-03 (×2): 30 meq via ORAL
  Filled 2014-10-03 (×2): qty 30

## 2014-10-03 NOTE — Interdisciplinary (Signed)
Text paged MD regarding pt diastolic 31 and increasing and worsening cough.  Will continue to monitor.

## 2014-10-03 NOTE — Plan of Care (Signed)
Report given to Gwyndolyn Saxon. Updated on pt's POC, assessment, meds. No s/s of distress. Care has been resumed.

## 2014-10-03 NOTE — Plan of Care (Signed)
Problem: Alteration in Blood Glucose  Goal: Glucose level within specified parameters  Outcome: Not Met  Last blood sugar check was 197. The patient was given insulin per sliding scale. No signs or symptoms of hyperglycemia or hypoglycemia. Will continue to assess.

## 2014-10-03 NOTE — Interdisciplinary (Signed)
OT Daily Treatment Note    Subjective:  RLE: No Precautions  LLE: No Precautions  RUE: No Precautions  LUE: No Precautions  Spine orthotic: None  Additional  Precautions/Contrandications:: Fall Risk    Pain Location: no reports of pain    Status: Agreeable to treatment;RN ok'd treatment    Treatment Today  Therapeutic Activities [97530]: x2              Objective:  Objective Findings: Pt received supine in bed with family present agreeable to therapy. Pt min A to the edge of bed with good sitting balance. Pt participated in UE ROM exercises at the edge of bed including forward flexion x10 on each side. Pt agreeable to OOB therapy, min A to a chair with FWW. Pt able to comb hair while seated in chair. Pt completed further UE ROM exercises including shoulder abduction/adduction and scapular retraction/protraction x10 each. Pt returned to bed with min A using FWW. Pt with needs met, bed alarm on and RN aware of pt's status.    Assessment:  Assessment: Pt progressing with OOB activity to a chair with min A although continues to have decreased activity tolerance and generalized weakness. Pt was encouraged to increase OOB activity with nursing to a chair and bedside commode, RN aware and informed of pt's abilities. Pt will continue to be seen by inpatient OT to maximize I with OOB ADL's.  Equipment Recommendations: to be determined as patient progresses with therapy    Plan:  Continue therapy for Diagnosis: Difficulty in walking (dec activity tolerance ) 719.7  Frequency: 4-7 times/wk  Focus for Next Treatment: ADL training ;Patient exercise program instruction;Functional mobility training for ADLs;Balance activities;Graded tasks to increase function;Safety instruction    Total Treatment Time (min): 30  Total Timed Treatment (min) : 30

## 2014-10-03 NOTE — Progress Notes (Signed)
Inpatient Medicine Progress Note       Interval Events/Subjective:   IR performed thoracentesis yesterday with 1L removed.  Today pt reports improvement in abd pain and wants to try food.  Nausea resolved.      Vitals signs:     Latest Entry  Range (last 24 hours)    Temperature: 97.7 F (36.5 C)  Temp  Avg: 98.5 F (36.9 C)  Min: 97.7 F (36.5 C)  Max: 99.5 F (37.5 C)    Blood pressure (BP): 136/52 mmHg  BP  Min: 114/57  Max: 142/55    Heart Rate: 107  Pulse  Avg: 111.4  Min: 104  Max: 127    Respirations: 18  Resp  Avg: 18.6  Min: 17  Max: 20    SpO2: 100 %  SpO2  Avg: 98.3 %  Min: 95 %  Max: 100 %       No Data Recorded     Yesterday's intake and output:  02/22 0600 - 02/23 0559  In: 982 [P.O.:432; I.V.:550]  Out: 1000     Physical Exam  GENERAL:  Thin appearing woman in NAD.  Pleasant and cooperative.     HEENT: EOMI. Oropharynx appears moist. No LAD.   CARDIOVASCULAR:  Tachycardic and irregular rhythm.   LUNGS: CTAB  ABDOMEN: Soft, non-distended. Normoactive bowel sounds. Non-tender  EXTREMITIES: Warm and well perfused.   No asterixis     Labs seen and reviewed.     Micro  GI pathogen panel 2/14 - norovirus   BCx 2/14 - NGTD  Uclx 2/20: pending         A/P:  67F with HCV (GT2, SVR), cirrhosis (c/b ascites, HE, EV) 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     #sepsis: suspect from UTI or infected pleural fluid (SBE), less likely cdiff (cdiff toxin neg, GI panel pending). Wbc rising on ctx  - change ctx to vanc zosyn  - cont cdiff rx until GI panel back  - f/u pleural fluid cultures  - CT a/p to eval for other source of infection  - resend urine culture     # colonic dilation: less likely 2/2 cdiff as negative studies.  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 F/u ct a/p, repeat abd xray in am.      #AMS: multifactorial, contribution of HE + infection with norovirus and now possibly UTI. Improving.    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 Abdominal ultrasound on admission shows patency of vessels.   --Hepatic encephalopathy: continue lactulose/rifaximin, Improving. defer downsizing of TIPS given that downsizing will likely lead to recurrent re accumulating HHT.   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   --Ascites: lasix 20/aldactone 50. S/p TIPS --> hold diuretics now d/t concern for sepsis  Recurrent hepatic hydrothorax: will ask IR to perform thoracentesis with studies on Monday.  --HCC: CT 06/2014 without lesions c/f HCC, repeat due in May.     #Afib: Initially in RVR but HR now better controlled on oral diltiazem  -continue diltiazem, change to ER once dose determined   -Previous team had extensive discussion with patient and family regarding risk/benefits of anticoagulation and family wanted to pursue [facility] with xarelto however will defer beginning ac at this time given patient is at a high fall  risk in setting of acute encephalopathy. Patient and family expressed agreement with this plan and discussion regarding ac will be revisited pending improvement in her encephalopathy.     #norovirus: diarrhea resolved.   --encourage oral intake, and also contact precautions    #DM2: not well controlled   --cont lantus to 35units and 16units lispro qac per endocrine recs  --add insulin coverage for glucerna supplements   --high intensity SSI     #Osteoporosis  --Continue home calcium and vitamin D    #GERD:   --Continue pepcid 20 mg daily    # moderate malnutrition: appreciate nutrition recs. Advance diet when clinically improves (currently on clears)    VTE PPx: SQ heparin   CODE : Full Code  DISPO: Pending clinical improvement.

## 2014-10-03 NOTE — Interdisciplinary (Signed)
PT Daily Treatment Note    Discharge Information:     PHYSICAL THERAPY DISCHARGE RECOMMENDATIONS  Discharge Physical Therapy and equipment needs: Patient will benefit from continued skilled Physical Therapy  Patient is appropriate for discharge to: a supervised living situation  Patient has the following deficits that may restrict safe completion of mobility activities of daily living skills: physical limitations  Patient functional limitations necessitate consideration of the following assistive device to complete mobility activities of daily living safely: [place]    Subjective:  RLE: No Precautions  LLE: No Precautions  RUE: No Precautions  LUE: No Precautions  Spine orthotic: None  Additional  Precautions/Contrandications:: Fall Risk       Pain Score (0-10): 0  Pain Location: Denies pain    Status: Agreeable to treatment;RN ok'd treatment     Treatment Today  Therapeutic Activities [97530]: x2  Therex [97110]: x1                                                                                                                           Objective:   Objective Findings: Pt received sitting at EOB with OT. Performed Postural activities in sitting with cuing and min A. Pt required short frequent rest breaks. Pt agreeable to sit up in chair. Sit to stand with min A. Gait with severe posterior weight shift. X3' to chair. Pt required cuing to take big steps Pt required mod A for transfer to chair due to lean. Pt request to remain up in chair for 15 mins. X 10 reps BLE Heel/toe raises, LAQ, Hip flexion, Hip flexion with knee extension, Abduction, Adduction isometric. Sit to stand Min A. Transfer back to bed with mod A and FWW due to posterior push. Pt returned to supine with mod A. Repositioned for comfort. All needs in reach. Encouraged pt to call RN to sit up in chair throughout the day.    Assessment:  Assessment: Pt presents with decreased activity tolerance, posterior weight shifting with transfers  requiring mod A, and min/mod A for OOB activity. Pt will continue to benefit from skilled PT to advance transfers and gait to increase functional mobility for discharge. Pt would benefit from being OOB to chair at least 3X daily with RN staff. Continue to progress as able with focus on EOB/OOB activity.       Plan:    PHYSICAL THERAPY PATIENT DISCHARGE INSTRUCTIONS  Your Physical Therapist suggests the following: Continue to complete your home exercise program daily as instructed;Supervision with walking is suggested for increased safety;Continue to use your assistive device as instructed when walking to improve your stability and prevent falls  The following assistive device has been recommended for your safety: [place] therapy for Diagnosis: Difficulty in walking (dec activity tolerance ) 719.7  Focus for Next Treatment: Gait training;Functional mobility training;Balance activities;Graded tasks to increase function;Safety instruction;Therapeutic exercise  Frequency: 4-7 times/wk    Total Treatment Time (min): 45  Total Timed Treatment (min) : 45

## 2014-10-03 NOTE — Interdisciplinary (Signed)
Nutrition f/u note.   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 S/p TIPS (06/2014) with recent revision no 09/14/14.   Colonic dilation: less likely 2/2 cdiff as negative studies. 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 AMS improving.  S/p thora -1L 2/22.      F/u wt: 117 lbs today (53.2 kg) @111 % IBW of 105 lbs. Current wt around admit wt. Wt range since admit: 111-117 lbs. Given pt w/ESLD, fluids status and pt s/p thora, will base needs on lowest wt since admit on 2/18 @ 111 lbs(50.5 kg,106% IBW of 105 lbs. BMI: 21). Will continue to monitor trends.    Wt hx: see 2/16 note  Diet Rx:  Clears for MRI, glucerna TID  GI: soft, NT, ND abd, NABS, N improved, asking for food, ddentulous, on SLP list, on pureed, NTL per last eval 2/19.  +3bm so far today. Going for MRI today 2/2  persistent abd pain complaints.   PO intake: still poor po since last f/u. Po ranged 0/4-3/6 x 9 recorded meals(when not NPO/clears) with an average of 30% of meals and 1.6 glucerna/day (853 kcal (62% low end pervious needs), 41g protein (60% low end needs). Pt likes the glucerna(no chocolate flavor). Would like to get chicken salad scoop as a snack.   I/O: 787/1000 ( adequacy deferred to MD). Net fluids since admit: +8.42 L  Labs: POCT-BS: 159,  K @ 3 trend down(replaced), phos @ 2.5 trend down, Mg @ 2.4, transaminases wnl, T bii @ 3.43 trends down. WB Zn @ 643.4(06/2014), Vit D(25-OH)@22 (06/2014), Vit A @ 0.22(11/205)  Meds: cholecalciferol 400 units, pepcid, lispro SSI, lactulose 20 BID, flagyl, MVT, zosyn, KCl/completed, rifaximin, spironolactone, vanco, compazine prn, simethicone prn.   Skin:ecchymosis per RN  head to toe assessment 2/23.   Edema: trace to RLE and LLE per RN head to toe assessment 2/23.   Nutrition-Focused Physical Exam 2/16: (visual) pt expressed being tired of being examined today, explained didn't need to move and we didn't need to touch her  Subcutaneous Fat Loss: Orbital Region: Severe Muscle Loss: Temporalis: moderate, Quadricepts: moderate, Patellar Region: moderate Posterior Calf Region: mild  NFPE2/23: Severe fat loss to orbital and thorasic region, moderate to severe fat loss to upper arm region. Severe muscle loss to temple area, clavicles are protruded, moderate to severe loss to acromion region, moderate loss to quadriceps, patellar region and gastrocnemius muscle. Poor nail pefusion.      Estimated needs per Mif[place]in(50.5kg):952 kcal x 1.5-1.7= 1428-1618 kcal/day (28-32 kcal/kg). 61-76g protein (1.2-1.5g protein/kg) Maintenance Fluids /deferred to MD/ on diuretics    D: Severe malnutrition r/t chronic catabolic illness AEB pt w/ESLD, ~18.8% wt loss x 1.5 years, <50% calorie intake x at least 2 wks(1 wk PTA) and per physical exam findings.     I: GOAL(2/16): Pt to meet >75% of estimated needs with acceptable tolerance(not met)  GOAL(2/23):Pt to meet >75% of estimated needs with acceptable tolerance by next f/u    Plan/Recs:   1-Rec to repalce phos, to monitor K and to continue monitoring lytes and replacing as needed.   2- When clinically feasible, rec to ADAT to  carb limited, 2g Na  3-Rec to continue glucerna TID. Will provide high protein snacks to encourage more po intake  4- Highly rec to increase cholecalciferol to 5000 units daily, and to add minerals to MVT  5- Highly rec to check whole blood Cu and Se  6- Rec to continue daily wt    Nutrition POC(coordination of care): paged to MD [facility] [place]  Education: When/if appropriate  Discharge: pending clinical course     RD to monitor/evaluate PO intake/dequacy,  GI condition, labs, wt trends, skin condition, and will f/u per policy  Rochele Pages, MS, RD pager # [phone]

## 2014-10-03 NOTE — Plan of Care (Signed)
Problem: Discharge Planning  Goal: Participation in care planning  Outcome: Met  The patient AOx4 and able to participate in care. No discharge instructions/ plans at this time.        Problem: Falls, Risk of  Goal: Keep patient free from falls utilizing universal fall precautions  Outcome: Met  Assessment complete. Pt at high risk for falls. Pt is AOx4. Bed is the lowest position and brakes are locked into place. Bed alarm on and call light within reach. Pt encouraged to used call light. Will monitor the pt closely. Pt works with PT and OT and walks with FWW.         Problem: Tissue Perfusion, Cardiopulmonary - Altered  Goal: Hemodynamic stability  Outcome: Met  The pt is hemodynamically stable. The pt denies chest pain or pressure. RN informed that the blood pressure will be taken automatically, and to immediately report any chest pressure or pain. Will continue to monitor.  BP 132/54 mmHg   Pulse 112   Temp(Src) 99.5 F (37.5 C)   Resp 17   Ht 5' 1"  (1.549 m)   Wt 51.3 kg (113 lb 1.5 oz)   BMI 21.38 kg/m2   SpO2 99%        Problem: Pain - Acute  Goal: Control of acute pain  Outcome: Met  The patient  denied pain. Will reassess pain level and document result. The patient was instructed regarding pain management control. The patient was encouraged to report increased levels of pain to the nurse. The patient verbalized understanding to instructions and teaching. No complaints of pain noted at this time. Will medicate patient for pain per MD order. Will monitor pain levels.        Problem: Infection  Goal: Absence of infection signs and symptoms  Outcome: Met  The patient is afebrile, vitals are stable. No signs of infection. Will continue to monitor.    Results for SHAHANA, CAPES (MRN [id number]) as of 10/03/2014 00:46    Ref. Range 09/19/2014 05:37   WBC Latest Ref Range: 4.0-10.0 1000/mm3 17.9 (H)         Problem: Skin Integrity- Intact patient high risk for impaired skin integrity Braden scale less than or  equal to 18  Goal: Skin remains intact  Outcome: Met  The patient tolerates being repositioned every two hours. Mepilex applied to pt sacrum. Protective barrier used as needed. Skin is kept clean and dry. No evidence of new skin breakdown.          Problem: Aspiration, Risk of  Goal: Evidence of swallowing difficulties is absent  Outcome: Met  No signs of aspiration while drinking. Head of bed at 35 degrees. RN educated the pt on risk for aspiration. The patient verbalized understanding.

## 2014-10-03 NOTE — Interdisciplinary (Signed)
Report given to Courtney, RN and endorsed care. Patient in no apparent distress, vital signs stable. RN aware of the patient plan of care, the patient's condition, labs, and treatment.

## 2014-10-03 NOTE — Plan of Care (Signed)
Problem: Discharge Planning  Goal: Participation in care planning  Outcome: Met  No orders placed for discharge.  Patient still needs medical stability and clearance.  Will continue to monitor.        Problem: Falls, Risk of  Goal: Keep patient free from falls utilizing universal fall precautions  Outcome: Met  Patient bed at lowest setting with side rails up x2.  Bed brakes and alarm on.  Pt call light and bedside table within reach.  Family at bedside at all times Non-skid socks on. Patient free from falls. Will continue to monitor.    Problem: Tissue Perfusion, Cardiopulmonary - Altered  Goal: Hemodynamic stability  Outcome: Met  Patient does not present signs or symptoms of altered cardiopulmonary tissue perfusion; afebrile; denies chest pain; vital sign with in MD parameters see doc flowsheet.  Pt has controlled AFIB with RVR and is on dilt PO.   Will continue to monitor.          Problem: Pain - Acute  Goal: Communication of presence of pain  Outcome: Met  Pt denies pain at this time.  Patient has PRN pain medication available.  Will continue to monitor.    Problem: Alteration in Blood Glucose  Goal: Glucose level within specified parameters  Outcome: Met  Last BSFS 159.  Pt was covered with sliding scale.  Pt has glucerna and meal coverage.    Problem: Infection  Goal: Absence of infection signs and symptoms  Outcome: Met  Patient does not present active signs or symptoms of infections; afebrile; vital signs with in MD parameters see doc flowsheet; pt on antibiotics; Will continue to hourly monitor the patient for further signs and symptoms of infections.    Problem: Skin Integrity- Intact patient high risk for impaired skin integrity Braden scale less than or equal to 18  Goal: Skin remains intact  Outcome: Met  Pt turned and posterior skin assessed for signs of redness, skin is completely intact, no areas of redness noted.  Pt does have bilateral ecchymosis.  Will continue to monitor.

## 2014-10-03 NOTE — Progress Notes (Signed)
ATTENDING PROGRESS NOTE ATTESTATION  Interval history:  Abdominal pain improved. Colonic ileus persists  Hungry  See fellow's history and physical for further details of the patient's history.    Objective  I have examined the patient and concur with the fellow's exam.  Labs were reviewed.  Wbc 17K-->23K    Assessment and Plan  I agree with the fellow's care plan.  15F with HCV gt2 cirrhosis (SVR) c/b ascites and HHthor s/p TIPS 06/28/14, s/p TIPS revision 09/14/14, admitted for HE. Issues with noncompliance as well as norovirus infection. GIven her rapid improvement of hepatic encephalopathy with rifaximin and lactulose, will defer downsizing the transjugular intrahepatic portosystemic shunt.   -- wbc to 17K-->23K. Cdiff negative. High neutrophis off pelural fluid but no LDH or TP sent. Would broaden abx for UTI+?pleural fluid infection. Would get Urine Cx since not sent last time. F/u cx from thora. Can stop rectal vanc.    -- colonic ileus: clears. CT non con. Correct electrolytes. Treat infections as above. Try to get OOB.    -- afib: if mental status stable over a few weeks, can consider anticoag for afib. would not start it if she continues to have episode of hepatic encephalopathy making her a high fall risk.   -- hydrothorax: see above. Holding diuretics today with worsening infection.     See the fellow's history and physical for further details.  [name] MD, PID [id number]

## 2014-10-03 NOTE — Plan of Care (Signed)
Problem: Discharge Planning  Goal: Verbalizes/demonstrates knowledge of discharge instructions  No changes in pt status at this time. Pt resting comfortably in no apparent distress. Pt skin is clean and dry at this time.  Will continue to monitor.

## 2014-10-03 NOTE — Progress Notes (Signed)
Hepatology Progress Note  Consult Attending: Dr. Marykay Lex, MD    24h events:   -pt still complains of abdominal pain and distention, improved since yesterday, on the kub from last evening stable distention.     Past Medical History:  Past Medical History   Diagnosis Date    Migraine headache     Glaucoma     Ascites 06/29/2007     History of paracentesis x 2    Pancreatic cyst 06/29/2007    Cirrhosis ([place]) 06/24/2007     Active on the liver transplant waiting list with a blood type of O positive. 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 Due to Hep C    Osteopenia 06/24/2007    Emphysema 06/24/2007    EV (esophageal varices) ([place]) 06/10/2007    Anemia 06/10/2007    HCV (hepatitis C virus) 06/10/2007     Achieved SVR.    Type II or unspecified type diabetes mellitus with peripheral circulatory disorders, not stated as uncontrolled(250.70) 06/10/2007    HTN 06/10/2007    HTN 06/10/2007       Past Surgical History:   Past Surgical History   Procedure Laterality Date    Pb laps surg cholecstc w/expl common duct  1970s     in [place];  open CCX    Pb cesarean delivery only      Pb cstoplasty/cstourtp plstc any       Bladder suspension    Egd procedure  06/01/2013     Procedure: GI EGD;  Surgeon: Jeanette Caprice, MD;  Location: [facility];  Service: Gastroenterology;;    Tips procedure         Allergies:  No Known Allergies    Home Medications:  Prescriptions prior to admission   Medication Sig Dispense Refill Last Dose    alendronate (FOSAMAX) 70 MG tablet    09/22/2014    ALPHAGAN P 0.1 % ophthalmic solution    09/22/2014    bacitracin 500 UNIT/GM ointment Apply small amount to affected area on face twice daily 1 Tube 1 09/22/2014    CALCIUM CARBONATE 600 MG OR TABS 1 tablet 2 times daily one month 11 09/22/2014    ciprofloxacin (CIPRO) 500 MG tablet Take 1 tablet by mouth daily. 30 tablet 5 09/22/2014    furosemide (LASIX) 20 MG tablet Take 2 tablets by mouth daily. 90 tablet 0 09/22/2014     glipiZIDE (GLUCOTROL) 5 MG tablet Take 0.5 tablets (2.5 mg) by mouth 2 times daily. 30 tablet 1 09/22/2014    insulin glargine (LANTUS) 100 UNIT/ML injection Inject 20 Units under the skin daily. 10 mL 1 09/22/2014    lactulose 10 GM/15ML solution Take 30 mLs (20 g) by mouth 3 times daily. 946 mL 0 09/22/2014    LUMIGAN 0.01 %    09/22/2014    multivitamin (MULTI-VITAMIN) tablet Take 1 tablet by mouth daily.   09/22/2014    ranitidine (ZANTAC) 300 MG tablet Take 300 mg by mouth 2 times daily.   09/22/2014    rifaximin (XIFAXAN) 550 MG tablet Take 550 mg by mouth every 12 hours.   09/22/2014    sitagliptin (JANUVIA) 50 MG tablet Take 1 tablet (50 mg) by mouth daily. 60 tablet 1 09/22/2014    spironolactone (ALDACTONE) 50 MG tablet Take 1 tablet (50 mg) by mouth daily. 30 tablet 0 09/22/2014    VITAMIN D 400 UNIT OR CAPS 1 CAPSULE DAILY 30 11 09/22/2014       Inpatient Medications:  Scheduled  Meds   cholecalciferol  400 Units Daily    diltiazem  45 mg Q6H    famotidine  20 mg Daily    furosemide  20 mg Daily    insulin glargine  35 Units Daily    insulin lispro  1-10 Units 4x Daily WC    insulin lispro  16 Units TID WC    insulin lispro  8 Units TID WC    lactulose  20 g BID    metroNIDAZOLE  500 mg Q8H    multivitamin  5 mL Daily    piperacillin-tazobactam (ZOSYN) IVPB  3,375 mg Q8H    potassium chloride  10 mEq Once    potassium chloride  30 mEq Q2H    rifaximin  550 mg Q12H    sodium chloride (PF)  3 mL Q8H    spironolactone  50 mg Daily    vancomycin (VANCOCIN) IVPB  1,000 mg Q12H NR     IV Meds   sodium chloride       Social History:   Lives with husband. Has 10 children. Was a homemaker.  Denies alcohol, drugs, tobacco.     Review of Systems: -   Constitutional: Denies fevers, chills, weight loss  ENT: negative for eye changes, oral lesions, difficulty swallowing  Cardiac: Denies chest pain or palpitations  Lungs: Denies cough or shortness of breath  GI: Negative except noted in the HPI  GU:  Denies dysuria, hematuria  Skin: Denies rashes or other skin lesions  MSK: Denies joint deformities, new joint pain  Lymph: Denies enlarged nodes, night sweats, abnormal bleeding  Psych: negative for depressed mood, anxiety  Neuro: negative for confusion, weakness, sensory changes    OBJECTIVE:   Vitals signs:   Latest Entry  Range (last 24 hours)    Temperature: 97.7 F (36.5 C)  Temp  Avg: 98.4 F (36.9 C)  Min: 97.7 F (36.5 C)  Max: 99.5 F (37.5 C)    Blood pressure (BP): 136/52 mmHg  BP  Min: 114/57  Max: 140/55    Heart Rate: 107  Pulse  Avg: 111.4  Min: 104  Max: 127    Respirations: 18  Resp  Avg: 18.2  Min: 17  Max: 20    SpO2: 100 %  SpO2  Avg: 98.1 %  Min: 95 %  Max: 100 %       No Data Recorded     Weight - scale: 53.2 kg (117 lb 4.6 oz)  Percentage Weight Change (%): 3.7 %    Intake/Output       09/27/2014 0600 - 10/03/14 0559 10/03/14 0600 - 10/04/14 0559      [id number] 9937-1696 Total 0600-1759 7893-8101 Total       Intake    P.O.  237  195 432  --  -- --    I.V.  150  400 550  --  -- --    Total Intake 387 595 982 -- -- --       Output    Other  1000  -- 1000  --  -- --    Total Output 1000 -- 1000 -- -- --       Net I/O     -613 595 -18 -- -- --        Respiratory support:  Oxygen Therapy  SpO2: 100 %  O2 Device: None (Room air)  O2 Flow Rate (L/min): 2 l/min     Physical Exam:  General:  Well developed,  thin elderly Hispanic female in no apparent distress, somnolent, a&o to person and place (never oriented to date per son)  HEENT:  No scleral icterus, Trachea midline, mucus membranes moist.  Lungs:  Diminished BS at R base  Cardiovascular:  Irregularly irregular  Abdomen:  Abdomen less tense, mildly tender to palpation diffusely, bowel sound present. No peritoneal signs.  Extremities:  Trace peripheral edema RLE > LLE. Warm well-perfused.  Skin:  Non-icteric and no visible rash  Neuro:  Cranial nerves II-XII grossly intact. Mild asterixis    Labs:     CBC  Recent Labs      09/11/2014   0537   10/03/14   0459   WBC  17.9*  21.6*   HGB  9.6*  9.5*   HCT  26.9*  27.2*   PLT  95*  108*   SEG  87*  88*   LYMPHS  7*  4*   MONOS  6  7      Chemistry  Recent Labs      10/03/2014   0537  09/13/2014   1721  10/03/14   0459   NA  139  138  139   K  3.0*  3.3*  3.0*   CL  106  104  109*   BICARB  18*  18*  17*   BUN  22*  23*  24*   CREAT  0.66  0.75  0.76   GLU  163*  255*  98   [place]  8.4*  8.4*  8.2*   MG  2.1   --   2.4   PHOS  2.7   --   2.5*     Recent Labs      09/28/2014   0537  10/03/14   0459   ALK  92  100   AST  21  20   ALT  20  21   TBILI  4.01*  3.43*   ALB  3.1*  2.8*        Coags  Recent Labs      09/11/2014   0537  10/03/14   0459   PT  22.5*  23.3*   INR  2.1  2.1       Radiology:   CT A/P w/ contrast 09/25/14: FINDINGS:  LUNG BASES: Small to moderate right pleural effusion with right lower lobe   atelectasis  LIVER/BILIARY:Diffuse nodularity and hepatic atrophy. Stable scattered tiny   low attenuation lesions in the liver.  PANCREAS: Stable numerous pancreatic cysts.  SPLEEN: Unremarkable.  ADRENAL GLANDS: Unremarkable  KIDNEYS: Stable left renal cyst.  STOMACH/DUODENUM: Mild wall thickening from the distal stomach to the proximal  jejunum  VASCULATURE: Patent TIPS. Stable nonocclusive thrombus of the main portal vein  and superior mesenteric vein. New thrombosis of the left portal vein.  LYMPHATIC: Unremarkable  SMALL & LARGE BOWEL: Unremarkable  BLADDER/PELVIC ORGANS: Unremarkable  BONES/SOFT TISSUES: Unremarkable  OTHER: Trace pelvic ascites    IMPRESSION:  1) New thrombosis of the left portal vein. Vessel was patent on prior   abdominal ultrasound 09/11/2014  2) Edema of the distal stomach through proximal jejunum suggestive of   enteritis, nonspecific    CT head 09/20/2014: IMPRESSION:  1. No evidence of acute intracranial hemorrhage or findings to suggest   territorial/transcortical infarct.  2. Mild white matter changes. These may be associated with prior small vessel   ischemia. If there is concern for  encephalopathy, MRI would be more helpful  for acute evaluation. MRI screening: No intracranial or intraorbital metal.  3. Frothy secretions within the right sphenoid sinuses well as reactive bone   changes, suggestive of acute on chronic sinusitis.    RUQ U/S 10/03/2014: FINDINGS:  The liver measures 11.3 cm in long axis. It is normal in echogenicity. No   focal lesions are identified. There is no intra or extra hepatic bile duct   dilation. The common bile duct measures four mm. The gallbladder is not   visualized. A patent TIPS catheter is present. The TIPS velocities are: Portal  end 115 ; mid 93 ; hepatic 125 cm/s. Flow in the right and left portal veins   is hepatofugal and towards the TIPS ; flow in the main portal vein is   hepatopetal.  The right kidney measures nine cm in long axis and is within normal limits   with no hydronephrosis or renal calculi.  The visualized portion of the pancreas again demonstrates multiple cystic   lesions throughout the pancreas, as seen on prior imaging  The visualized aorta and inferior vena cava are within normal limits.  Right pleural effusion. No evidence of intra abdominal ascites.    IMPRESSION:  Patent TIPS catheter with normal velocities and appropriate flow in portal   veins.  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      Prior Endoscopy:   EGD 05/2013:  Findings: 1. Midline orophyarngeal prominence at the  arytenoid  2. Three columnns of grade 1 esophageal varices, no high  risk features  3. No gastric varices, mild portal hypertensive gastropathy  4. Few punctate clots in the antrum  5. Mild duodenal congestion consistent with portal  hypertension  Endoscopic Diagnosis: 1. Midline orophyarngeal  prominence at the arytenoid  2. Three columnns of grade 1 esophageal varices, no high  risk features  3. No gastric varices, mild portal hypertensive gastropathy  4. Few punctate clots in the  antrum  5. 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 Continue propranolol 10 BID  2. ENT consultation to evaluate oropharyngeal findings  3. 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 Confusion associated with poor medication compliance, including lactulose, N/V, abd pain, diarrhea, and f/c/s. HDS. Work-up here showed GI pathogen panel with norovirus and recent UA consistent with UTI.     #AMS: Resolved. Likely precipitated by infection from UTI and norovirus infection.   -deferring previous plans to downsize TIPS given several infectious precipitants and TIPS downsizing will increase rate of pleural effusion accumulation.    # Sepsis/abdominal pain  Patient with improved abdominal pain and bloating today. X ray found with colonic ileus, up 7.9 cm colon 10/08/2014. We need to rule out c diff and treat empirically. Also pt with worsening leukocytosis. Thoracentesis with 1400 wbc/70% neutrophils.    - started vanco zosyn (02/23-)  - cont supportive care for norovirus  - flagyl IV for possible c diff infection  - clear liquid diet, NPO if clinically worse  - c diff testing NAT  -  frequent abdominal exams  - ct non con   - thoracentesis 10/05/2014 with 1400 wbc and 70% neutrophils  - albumin 25% 75g iv x1    #)Norovirus: likely responsible for initial N/V, abd pain and diarrhea on presentation  - Follow up blood cultures: NGTD  - Supportive care with IVF and anti-emetics    #)Afib w rvr  S/p diltiazem, now rate controlled. CHADS score 2, chads vsc 4. High risk of fall due to her hepatic encephalopathy, highly concerned  for the possibility of anticoagulation. We would hold anticoagulation at this time.  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 and TIPS revision 09/14/14 MELD 19    * HE: still encephalopathic but improved, likely related to infection and TIPS revision,    - Continue lactulose enemas, or po titrate for 3-4 soft bowel movements a day and rifaximin  * Ascites: No ascites on recent U/S, diuretics lasix 20, aldactone 50  * EV's: S/p TIPS, no longer needs surveillance if TIPS is patent and she is stable  * HHT: S/p TIPS and TIPS revision   - RUQ U/S 2/13 with patent TIPS   - diuretics lasix 20, aldactone 50  * Non-occlusive portal vein thrombosis: Multiple outpatient notes indicate poor medication adherence/knowledge, remains fall risk as well.  Risks outweigh benefits  * [place] surveillance: Last CT on 06/27/2014 showed no HCC. Repeat imaging in May 2016.  * Transplant: Age >70, not a candidate    DWA Dr. Lilly Cove, MD  [facility]

## 2014-10-03 NOTE — Progress Notes (Signed)
Endocrine/Diabetes Progress Note    Overnight:  No acute events overnight     Subjective: Poor appetite;     Objective:   Temperature:  [97.3 F (36.3 C)-99.5 F (37.5 C)] 97.6 F (36.4 C) (02/23 1200)  Blood pressure (BP): (111-139)/(44-63) 111/44 mmHg (02/23 1252)  Heart Rate:  [104-117] 113 (02/23 1252)  Respirations:  [17-20] 18 (02/23 1200)  Pain Score:  [-] Patient Sleeping, Respiratory Assessment Done (02/23 1200)  O2 Device:  [-] None (Room air) (02/23 1200)  SpO2:  [98 %-100 %] 100 % (02/23 1200)  Body mass index is 22.17 kg/(m^2).    GEN: NAD, awake, chronically ill appearing  PULM: breathing comfortably    Diet: 0 carb clears + glucerna TID    Blood Sugars reviewed:        AM  Lu  Di  HS  O/N    02.21  326 371 378 248  Lantus   35  Lispro  0+6  0+6+10  0+3+10   +6    02.22  192 273 220 197  Lantus   35  Lispro  0+6+4 +10 +5    2.23  105 159  Lantus   35  Lispro   +3     A/P:  Paula Manning is a 74 year old female with uncontrolled DM2 c/b neuropathy as well as HepC cirrhosis, HTN, glaucoma and migraines admitted 10/04/2014 w/ n/v and AMS.     BG coming down today w/ decreased po intake, will d/c nutritional insulin now that pt on 0 carb clears.    Inpatient Recs:(paged to 1st call)  - continue lantus  35 units qday  - d/c lispro for meals now that pt on 0 carb clears  - cont Lispro 8 units per can of Glucerna  - continue high Lispro correction scale q ac/hs    Outpatient Recs:  Was on Lantus 20 units qhs, Januvia 50 mg qday and glipizide 2.5 mg bid w/ A1C 6.2%, but home BG 200-400s in setting of OJ-carrot juice and ++rice/beans intake. Final doses TBD based on inpatient needs.    will cont to follow

## 2014-10-04 LAB — CBC WITH DIFF, BLOOD
ANC-Manual Mode: 11.6 10*3/uL — ABNORMAL HIGH (ref 1.6–7.0)
Abs Eosinophils: 0.1 10*3/uL (ref 0.1–0.7)
Abs Lymphs: <0.1 10*3/uL — ABNORMAL LOW (ref 0.8–3.1)
Abs Monos: 0.5 10*3/uL (ref 0.2–0.8)
Bands: 17 % — ABNORMAL HIGH (ref 0–15)
Eosinophils: 1 % (ref 1–4)
Hct: 25.9 % — ABNORMAL LOW (ref 34.0–45.0)
Hgb: 8.8 gm/dL — ABNORMAL LOW (ref 11.2–15.7)
MCH: 33.6 pg — ABNORMAL HIGH (ref 26.0–32.0)
MCHC: 34 % (ref 32.0–36.0)
MCV: 98.9 um3 — ABNORMAL HIGH (ref 79.0–95.0)
MPV: 10.5 fL (ref 9.4–12.4)
Monocytes: 4 % — ABNORMAL LOW (ref 5–12)
Number of Cells Counted: 115
Plt Count: 79 10*3/uL — ABNORMAL LOW (ref 140–370)
Plt Est: DECREASED
RBC: 2.62 10*6/uL — ABNORMAL LOW (ref 3.90–5.20)
RDW: 20.9 % — ABNORMAL HIGH (ref 12.0–14.0)
Segs: 78 % — ABNORMAL HIGH (ref 34–71)
WBC: 12.2 10*3/uL — ABNORMAL HIGH (ref 4.0–10.0)

## 2014-10-04 LAB — COMPREHENSIVE METABOLIC PANEL, BLOOD
ALT (SGPT): 18 U/L (ref 0–33)
AST (SGOT): 22 U/L (ref 0–32)
Albumin: 3.9 g/dL (ref 3.5–5.2)
Alkaline Phos: 90 U/L (ref 35–140)
Anion Gap: 18 mmol/L — ABNORMAL HIGH (ref 7–15)
BUN: 29 mg/dL — ABNORMAL HIGH (ref 6–20)
Bicarbonate: 13 mmol/L — ABNORMAL LOW (ref 22–29)
Bilirubin, Tot: 4.51 mg/dL — ABNORMAL HIGH (ref <1.20)
Calcium: 8.5 mg/dL (ref 8.5–10.6)
Chloride: 105 mmol/L (ref 98–107)
Creatinine: 0.78 mg/dL (ref 0.51–0.95)
GFR: >60 mL/min
Glucose: 169 mg/dL — ABNORMAL HIGH (ref 70–115)
Potassium: 3.7 mmol/L (ref 3.5–5.1)
Sodium: 136 mmol/L (ref 136–145)
Total Protein: 5.8 g/dL — ABNORMAL LOW (ref 6.0–8.0)

## 2014-10-04 LAB — C.DIFFICILE TOXIN PCR, STOOL: C.DIFFICILE TOXIN, PCR: NOT DETECTED

## 2014-10-04 LAB — GLUCOSE (POCT)
Glucose (POCT): 167 mg/dL — ABNORMAL HIGH (ref 70–115)
Glucose (POCT): 188 mg/dL — ABNORMAL HIGH (ref 70–115)
Glucose (POCT): 192 mg/dL — ABNORMAL HIGH (ref 70–115)
Glucose (POCT): 184 mg/dL — ABNORMAL HIGH (ref 70–115)

## 2014-10-04 LAB — MAGNESIUM, BLOOD: Magnesium: 2.3 mg/dL (ref 1.7–2.6)

## 2014-10-04 LAB — PROTHROMBIN TIME, BLOOD
INR: 2.5
PT,Patient: 26.9 s — ABNORMAL HIGH (ref 9.7–12.5)

## 2014-10-04 LAB — PHOSPHORUS, BLOOD: Phosphorous: 2.7 mg/dL (ref 2.7–4.5)

## 2014-10-04 LAB — PATHOLOGY REVIEW

## 2014-10-04 MED ORDER — INSULIN LISPRO (HUMAN) 100 UNIT/ML SC SOLN (CUSTOM)
12.0000 [IU] | Freq: Three times a day (TID) | INTRAMUSCULAR | Status: DC
Start: 2014-10-04 — End: 2014-10-05
  Administered 2014-10-04 – 2014-10-05 (×2): 12 [IU] via SUBCUTANEOUS
  Filled 2014-10-04: qty 12

## 2014-10-04 MED ORDER — BENZONATATE 100 MG OR CAPS
100.0000 mg | ORAL_CAPSULE | Freq: Three times a day (TID) | ORAL | Status: DC | PRN
Start: 2014-10-04 — End: 2014-10-13
  Administered 2014-10-04 – 2014-10-10 (×2): 100 mg via ORAL
  Filled 2014-10-04 (×4): qty 1

## 2014-10-04 MED ORDER — GUAIFENESIN-DM 100-10 MG/5ML OR SYRP
10.0000 mL | ORAL_SOLUTION | ORAL | Status: DC | PRN
Start: 2014-10-04 — End: 2014-10-13
  Administered 2014-10-04 – 2014-10-10 (×2): 10 mL via ORAL
  Filled 2014-10-04 (×4): qty 10

## 2014-10-04 MED ORDER — FUROSEMIDE 10 MG/ML IJ SOLN
20.00 mg | Freq: Once | INTRAMUSCULAR | Status: AC
Start: 2014-10-04 — End: 2014-10-04
  Administered 2014-10-04: 20 mg via INTRAVENOUS
  Filled 2014-10-04: qty 4

## 2014-10-04 NOTE — Progress Notes (Signed)
ATTENDING PROGRESS NOTE ATTESTATION  Interval history:  Feels better  Still feels bloated but no pain  See fellow's history and physical for further details of the patient's history.    Objective  I have examined the patient and concur with the fellow's exam.  Labs were reviewed.  Wbc 17K-->23K-->12K today    Assessment and Plan  I agree with the fellow's care plan.  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 Issues with noncompliance as well as norovirus infection. GIven her rapid improvement of hepatic encephalopathy with rifaximin and lactulose, will defer downsizing the transjugular intrahepatic portosystemic shunt.   -- wbc improved with broadening abx coverage for presumed UTI+?infected pleural fluid. F/u cx. Continue vanc/zosyn. Can stop flagyl IV. F/u cx.   -- colonic ileus: Diet will keep liquid per patient preference and since still distended, CT non con without transition point. Correct electrolytes. Treat infections as above. Try to get OOB.  -- afib: if mental status stable over a few weeks, can consider anticoag for afib. would not start it if she continues to have episode of hepatic encephalopathy making her a high fall risk.   -- hydrothorax: see above. Lasix dc'd yesterday. Still on spiro. Of note, K has been running low so could continue spiro 50 alone for now and follow BMP.     See the fellow's history and physical for further details.  [name] MD, PID [id number]

## 2014-10-04 NOTE — Interdisciplinary (Signed)
PT Daily Treatment Note    Discharge Information:     PHYSICAL THERAPY DISCHARGE RECOMMENDATIONS  Discharge Physical Therapy and equipment needs: Patient will benefit from continued skilled Physical Therapy  Patient is appropriate for discharge to: a supervised living situation  Patient has the following deficits that may restrict safe completion of mobility activities of daily living skills: physical limitations  Patient functional limitations necessitate consideration of the following assistive device to complete mobility activities of daily living safely: [place]    Subjective:  RLE: No Precautions  LLE: No Precautions  RUE: No Precautions  LUE: No Precautions  Spine orthotic: None  Additional  Precautions/Contrandications:: Fall Risk       Pain Score (0-10): 0  Pain Location: Patient denied pain throughout session today, only reported having some chest discomfort and general fatigue/tiredness.     Status: Agreeable to treatment;RN ok'd treatment     Treatment Today  Therapeutic Activities [97530]: x2                                                                                                                           Objective:   Objective Findings: Patient received supine in bed with family present. Supine -> sit at EOB with handheld assist from family, min A. Good static sitting balance. Visual demonstration and verbal instruction for modified plantigrade position using tray table. Sit -> stand with CGA/min A, no AD. In modified plantigrade position, instructed on ant <> post weight shifting with emphasis on ant weight shift onto elbows 3x15 with 30 second holds in ant position at end of each set. Seated rest break in between sets. Progressed to ambulating in room with FWW 2x10f, CGA only for safety. Pt reported feeling fatigue and requested to return to supine in bed. Returned to supine in bed with SBA only, no manual assist required. Left supine in bed, call light within reach, RN notified,  family at bedside, all needs met.     Assessment:  Assessment: Patient with less posterior lean today compared to previous sessions and able to ambulate in room with only CGA for safety. Plan to continue training pt on ant weight shifting to decrease post lean so pt can more independently maintain standing balance necessary for functional mobility. Plan to progress ambulating next session as pt is able to tolerate. Pt would benefit from ongoing skilled PT to train balance, safety awareness, activity tolerance, and strength to maximize functional ability.        Plan:    PHYSICAL THERAPY PATIENT DISCHARGE INSTRUCTIONS  Your Physical Therapist suggests the following: Continue to complete your home exercise program daily as instructed;Continue to utilize correct body mechanics when moving in and out of bed as instructed;Supervision with walking is suggested for increased safety;Continue to use your assistive device as instructed when walking to improve your stability and prevent falls  The following assistive device has been recommended for your safety: [place]  therapy for Diagnosis: Difficulty in walking (dec activity tolerance ) 719.7  Focus for Next Treatment: Gait training;Functional mobility training;Balance activities;Graded tasks to increase function;Safety instruction;Therapeutic exercise  Frequency: 4-7 times/wk    Total Treatment Time (min): 30  Total Timed Treatment (min) : 30

## 2014-10-04 NOTE — Progress Notes (Signed)
Hepatology Progress Note  Consult Attending: Dr. Marykay Lex, MD    24h events:   - feeling better today, minimal abdominal pain, minimal distention. She reports productive cough. CT performed yesterday.    Past Medical History:  Past Medical History   Diagnosis Date    Migraine headache     Glaucoma     Ascites 06/29/2007     History of paracentesis x 2    Pancreatic cyst 06/29/2007    Cirrhosis ([place]) 06/24/2007     Active on the liver transplant waiting list with a blood type of O positive. 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 Due to Hep C    Osteopenia 06/24/2007    Emphysema 06/24/2007    EV (esophageal varices) ([place]) 06/10/2007    Anemia 06/10/2007    HCV (hepatitis C virus) 06/10/2007     Achieved SVR.    Type II or unspecified type diabetes mellitus with peripheral circulatory disorders, not stated as uncontrolled(250.70) 06/10/2007    HTN 06/10/2007    HTN 06/10/2007       Past Surgical History:   Past Surgical History   Procedure Laterality Date    Pb laps surg cholecstc w/expl common duct  1970s     in [place];  open CCX    Pb cesarean delivery only      Pb cstoplasty/cstourtp plstc any       Bladder suspension    Egd procedure  06/01/2013     Procedure: GI EGD;  Surgeon: Jeanette Caprice, MD;  Location: [facility];  Service: Gastroenterology;;    Tips procedure         Allergies:  No Known Allergies    Home Medications:  Prescriptions prior to admission   Medication Sig Dispense Refill Last Dose    alendronate (FOSAMAX) 70 MG tablet    09/22/2014    ALPHAGAN P 0.1 % ophthalmic solution    09/22/2014    bacitracin 500 UNIT/GM ointment Apply small amount to affected area on face twice daily 1 Tube 1 09/22/2014    CALCIUM CARBONATE 600 MG OR TABS 1 tablet 2 times daily one month 11 09/22/2014    ciprofloxacin (CIPRO) 500 MG tablet Take 1 tablet by mouth daily. 30 tablet 5 09/22/2014    furosemide (LASIX) 20 MG tablet Take 2 tablets by mouth daily. 90 tablet 0 09/22/2014     glipiZIDE (GLUCOTROL) 5 MG tablet Take 0.5 tablets (2.5 mg) by mouth 2 times daily. 30 tablet 1 09/22/2014    insulin glargine (LANTUS) 100 UNIT/ML injection Inject 20 Units under the skin daily. 10 mL 1 09/22/2014    lactulose 10 GM/15ML solution Take 30 mLs (20 g) by mouth 3 times daily. 946 mL 0 09/22/2014    LUMIGAN 0.01 %    09/22/2014    multivitamin (MULTI-VITAMIN) tablet Take 1 tablet by mouth daily.   09/22/2014    ranitidine (ZANTAC) 300 MG tablet Take 300 mg by mouth 2 times daily.   09/22/2014    rifaximin (XIFAXAN) 550 MG tablet Take 550 mg by mouth every 12 hours.   09/22/2014    sitagliptin (JANUVIA) 50 MG tablet Take 1 tablet (50 mg) by mouth daily. 60 tablet 1 09/22/2014    spironolactone (ALDACTONE) 50 MG tablet Take 1 tablet (50 mg) by mouth daily. 30 tablet 0 09/22/2014    VITAMIN D 400 UNIT OR CAPS 1 CAPSULE DAILY 30 11 09/22/2014       Inpatient Medications:  Scheduled Meds   cholecalciferol  400 Units Daily    diltiazem  45 mg Q6H    famotidine  20 mg Daily    insulin glargine  35 Units Daily    insulin lispro  1-10 Units 4x Daily WC    insulin lispro  8 Units TID WC    lactulose  20 g BID    multivitamin  5 mL Daily    piperacillin-tazobactam (ZOSYN) IVPB  3,375 mg Q8H    rifaximin  550 mg Q12H    sodium chloride (PF)  3 mL Q8H    spironolactone  50 mg Daily    vancomycin (VANCOCIN) IVPB  500 mg Q8H     IV Meds   sodium chloride       Social History:   Lives with husband. Has 10 children. Was a homemaker.  Denies alcohol, drugs, tobacco.     Review of Systems: -   Constitutional: Denies fevers, chills, weight loss  ENT: negative for eye changes, oral lesions, difficulty swallowing  Cardiac: Denies chest pain or palpitations  Lungs: Denies cough or shortness of breath  GI: Negative except noted in the HPI  GU: Denies dysuria, hematuria  Skin: Denies rashes or other skin lesions  MSK: Denies joint deformities, new joint pain  Lymph: Denies enlarged nodes, night sweats, abnormal  bleeding  Psych: negative for depressed mood, anxiety  Neuro: negative for confusion, weakness, sensory changes    OBJECTIVE:   Vitals signs:   Latest Entry  Range (last 24 hours)    Temperature: 97.6 F (36.4 C)  Temp  Avg: 97.9 F (36.6 C)  Min: 97.6 F (36.4 C)  Max: 98.4 F (36.9 C)    Blood pressure (BP): 118/50 mmHg  BP  Min: 106/62  Max: 139/53    Heart Rate: 113  Pulse  Avg: 103.4  Min: 95  Max: 118    Respirations: 24  Resp  Avg: 19.8  Min: 17  Max: 24    SpO2: 97 %  SpO2  Avg: 96 %  Min: 95 %  Max: 97 %       No Data Recorded     Weight - scale: 53.1 kg (117 lb 1 oz)  Percentage Weight Change (%): -0.19 %    Intake/Output       10/03/14 0600 - 10/04/14 0559 10/04/14 0600 - 10/05/14 0559      9528-4132 4401-0272 Total 0600-1759 5366-4403 Total       Intake    P.O.  1700  480 2180  480  -- 480    I.V.  803  500 1303  --  -- --    Total Intake 2503 980 3483 480 -- 480       Output    Total Output -- -- -- -- -- --       Net I/O     2503 980 3483 480 -- 480        Respiratory support:  Oxygen Therapy  SpO2: 97 %  O2 Device: None (Room air)  O2 Flow Rate (L/min): 2 l/min     Physical Exam:  General:  Well developed, thin elderly Hispanic female in no apparent distress, somnolent, a&o to person and place (never oriented to date per son)  HEENT:  No scleral icterus, Trachea midline, mucus membranes moist.  Lungs:  Diminished BS at R base  Cardiovascular:  Irregularly irregular  Abdomen:  Abdomen less tense, non tender to palpation, bowel sound present. No peritoneal signs.  Extremities:  Trace  peripheral edema. Warm well-perfused.  Skin:  Non-icteric and no visible rash  Neuro:  Cranial nerves II-XII grossly intact. Mild asterixis    Labs:     CBC  Recent Labs      09/27/2014   0537  10/03/14   0459  10/04/14   0455   WBC  17.9*  21.6*  12.2*   HGB  9.6*  9.5*  8.8*   HCT  26.9*  27.2*  25.9*   PLT  95*  108*  79*   BAND   --    --   17*   SEG  87*  88*  78*   LYMPHS  7*  4*   --    MONOS  6  7  4*         Chemistry  Recent Labs      10/03/14   0459  10/03/14   1721  10/04/14   0455   NA  139  133*  136   K  3.0*  3.7  3.7   CL  109*  102  105   BICARB  17*  13*  13*   BUN  24*  26*  29*   CREAT  0.76  0.77  0.78   GLU  98  149*  169*   [place]har  8.2*  8.5  8.5   MG  2.4   --   2.3   PHOS  2.5*   --   2.7     Recent Labs      10/03/14   0459  10/04/14   0455   ALK  100  90   AST  20  22   ALT  21  18   TBILI  3.43*  4.51*   ALB  2.8*  3.9        Coags  Recent Labs      10/03/14   0459  10/04/14   0455   PT  23.3*  26.9*   INR  2.1  2.5       Radiology:   CT A/P w/ contrast 09/25/14: FINDINGS:  LUNG BASES: Small to moderate right pleural effusion with right lower lobe   atelectasis  LIVER/BILIARY:Diffuse nodularity and hepatic atrophy. Stable scattered tiny   low attenuation lesions in the liver.  PANCREAS: Stable numerous pancreatic cysts.  SPLEEN: Unremarkable.  ADRENAL GLANDS: Unremarkable  KIDNEYS: Stable left renal cyst.  STOMACH/DUODENUM: Mild wall thickening from the distal stomach to the proximal  jejunum  VASCULATURE: Patent TIPS. Stable nonocclusive thrombus of the main portal vein  and superior mesenteric vein. New thrombosis of the left portal vein.  LYMPHATIC: Unremarkable  SMALL & LARGE BOWEL: Unremarkable  BLADDER/PELVIC ORGANS: Unremarkable  BONES/SOFT TISSUES: Unremarkable  OTHER: Trace pelvic ascites    IMPRESSION:  1) New thrombosis of the left portal vein. Vessel was patent on prior   abdominal ultrasound 10/05/2014  2) Edema of the distal stomach through proximal jejunum suggestive of   enteritis, nonspecific    CT head 09/28/2014: IMPRESSION:  1. No evidence of acute intracranial hemorrhage or findings to suggest   territorial/transcortical infarct.  2. Mild white matter changes. These may be associated with prior small vessel   ischemia. If there is concern for encephalopathy, MRI would be more helpful   for acute evaluation. MRI screening: No intracranial or intraorbital metal.  3. Frothy secretions within  the right sphenoid sinuses well as reactive bone   changes, suggestive of acute on  chronic sinusitis.    RUQ U/S 10/04/2014: FINDINGS:  The liver measures 11.3 cm in long axis. It is normal in echogenicity. No   focal lesions are identified. There is no intra or extra hepatic bile duct   dilation. The common bile duct measures four mm. The gallbladder is not   visualized. A patent TIPS catheter is present. The TIPS velocities are: Portal  end 115 ; mid 93 ; hepatic 125 cm/s. Flow in the right and left portal veins   is hepatofugal and towards the TIPS ; flow in the main portal vein is   hepatopetal.  The right kidney measures nine cm in long axis and is within normal limits   with no hydronephrosis or renal calculi.  The visualized portion of the pancreas again demonstrates multiple cystic   lesions throughout the pancreas, as seen on prior imaging  The visualized aorta and inferior vena cava are within normal limits.  Right pleural effusion. No evidence of intra abdominal ascites.    IMPRESSION:  Patent TIPS catheter with normal velocities and appropriate flow in portal   veins.  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      Prior Endoscopy:   EGD 05/2013:  Findings: 1. Midline orophyarngeal prominence at the  arytenoid  2. Three columnns of grade 1 esophageal varices, no high  risk features  3. No gastric varices, mild portal hypertensive gastropathy  4. Few punctate clots in the antrum  5. Mild duodenal congestion consistent with portal  hypertension  Endoscopic Diagnosis: 1. Midline orophyarngeal  prominence at the arytenoid  2. Three columnns of grade 1 esophageal varices, no high  risk features  3. No gastric varices, mild portal hypertensive gastropathy  4. Few punctate clots in the antrum  5. 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 Continue propranolol 10 BID  2. ENT consultation to  evaluate oropharyngeal findings  3. Repeat EGD in 1 year for varices screening        CT scan of the abdomen and pelvis without IV contrast 10/03/14    Compared to the prior CT abdomen pelvis of 09/25/2014, there is new gaseous   distention of the large bowel. No evidence of bowel obstruction, transition   point or pneumatosis intestinalis. There has been interval resolution of   proximal jejunal bowel wall thickening. No other significant changes compared   to the prior study.    Note that assessment of solid organs is limited in the absence of intravenous   contrast.    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 Confusion associated with poor medication compliance, including lactulose, N/V, abd pain, diarrhea, and f/c/s. HDS. Work-up here showed GI pathogen panel with norovirus and recent UA consistent with UTI.     #AMS: Resolved. Likely precipitated by infection from UTI and norovirus infection.   -deferring previous plans to downsize TIPS given several infectious precipitants and TIPS downsizing will increase rate of pleural effusion accumulation.    # Sepsis/abdominal pain  Patient with improved abdominal pain and bloating today. X ray found with colonic ileus,  up 7.9 cm colon 09/14/2014. Improved leukocytosis. Thoracentesis with 1400 wbc/70% neutrophils.    - started vanco zosyn (02/23-), need to continue to complete 7 days for treating possible urinary or pulmonary source  - cont supportive care for norovirus  - dc flagyl IV for possible c diff infection  - clear liquid diet, NPO if clinically worse  - ct non con:L gaseous distention, no transition point  - thoracentesis 09/16/2014 with 1400 wbc  and 70% neutrophils    #)Norovirus: likely responsible for initial N/V, abd pain and diarrhea on presentation  - Follow up blood cultures: NGTD  - Supportive care with IVF and anti-emetics as needed    #)Afib w rvr  S/p diltiazem, now rate controlled. CHADS score 2, chads vsc 4. High risk of fall due to her hepatic encephalopathy, highly concerned for the possibility of anticoagulation. We would hold anticoagulation at this time.  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 and TIPS revision 09/14/14 MELD 19    * HE: still encephalopathic but improved, likely related to infection and TIPS revision,    - Continue lactulose enemas, or po titrate for 3-4 soft bowel movements a day and rifaximin  * Ascites: No ascites on recent U/S, diuretics only on aldactone 50 as her K is running low  * EV's: S/p TIPS, no longer needs surveillance if TIPS is patent and she is stable  * HHT: S/p TIPS and TIPS revision   - RUQ U/S 2/13 with patent TIPS   - diuretics aldactone 50 as her K is running low  * Non-occlusive portal vein thrombosis: Multiple outpatient notes indicate poor medication adherence/knowledge, remains fall risk as well.  Risks outweigh benefits  * [place] surveillance: Last CT on 06/27/2014 showed no HCC. Repeat imaging in May 2016.  * Transplant: Age >70, not a candidate    DWA Dr. Lilly Cove, MD  [facility]

## 2014-10-04 NOTE — Progress Notes (Signed)
I have been asked to evaluate Paula Manning for increased shortness of breath.   The patient and the family state that since earlier this afternoon she developed more shortness of breath and tachypnea. She continued to cough with sputum production but that did not change in the last 2 days. She also felt weaker and more tired in the afternoon than earlier in the day.  She has been admitted to the hospital 10 days ago and has multiple medical problems that include end-stage liver disease,sepsis with likely an abdominal or pulmonary source, atrial fibrillation and hepatic encephalopathy. She was started on broad-spectrum antibiotics yesterday for suspected pneumonia or intra-abdominal infection. Being acutely ill and on multiple intravenous medications she received more than 3 liters of fluid yesterday and,at least per the EMR, she is positive more than 10 L of fluid since admission.    Physical examination blood pressure 135/59 heart rate 94 but has been as high as 113 earlier in the day, respirations between 20 and 24/min oxygen saturation 96% on room air 98% on 2 L nasal cannula  Neck is supple with elevated JVP at 11 cm no lymphadenopathy  Lungs with bilateral decreased breath sounds and crackles  Heart with irregular rhythm, no murmurs  Abdomen with mild distention no palpable organomegaly or tenderness  Extremities with bilateral pedal edema 2+ at least up to the knee    Most recent laboratory workup was reviewed no major abnormalities were identified relevant to this clinical situation.    Assessment and plan Paula Manning is respiratory distress is most likely caused by a number of factors that include the recently diagnosed pneumonia, fluid overload and possibly chronic aspiration.   I ordered 1 dose of Lasix 20 mg intravenously and this will be followed in terms of clinical response and urine output and might require additional doses.   She is on the appropriate antibiotic coverage for now.   A swallowing  evaluation was done most recently on 2/19 and pured diet was recommended.It might be reasonable to ask speech therapy to reevaluate her since this most recent evaluation was for 5 days ago

## 2014-10-04 NOTE — Progress Notes (Signed)
Inpatient Medicine Progress Note       Interval Events/Subjective:   No acute events overnight.  Abd pain improving but still with bloating after consuming clears.  No n/v/f/c.    Vitals signs:     Latest Entry  Range (last 24 hours)    Temperature: 97.6 F (36.4 C)  Temp  Avg: 98.1 F (36.7 C)  Min: 97.6 F (36.4 C)  Max: 98.7 F (37.1 C)    Blood pressure (BP): 126/47 mmHg  BP  Min: 105/31  Max: 127/38    Heart Rate: 104  Pulse  Avg: 103.9  Min: 95  Max: 113    Respirations: 19  Resp  Avg: 19  Min: 17  Max: 21    SpO2: 97 %  SpO2  Avg: 96.8 %  Min: 95 %  Max: 100 %       No Data Recorded     Yesterday's intake and output:  02/23 0600 - 02/24 0559  In: 4270 [P.O.:2180; I.V.:1303]  Out: -     Physical Exam  GENERAL:  Thin appearing woman in NAD.  Pleasant and cooperative.     HEENT: EOMI. Oropharynx appears moist. No LAD.   CARDIOVASCULAR:  Tachycardic and irregular rhythm.   LUNGS: CTAB  ABDOMEN: Soft, non-distended. Normoactive bowel sounds. Non-tender  EXTREMITIES: Warm and well perfused.   No asterixis     Labs seen and reviewed.     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     #sepsis: suspect from UTI or infected pleural fluid (SBE), less likely cdiff (cdiff toxin neg, GI panel pending). Wbc rising on ctx  - change ctx to vanc zosyn  - dc flagyl  - f/u pleural fluid cultures  - f/u urine culture     # colonic dilation: less likely 2/2 cdiff as negative studies.  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 F/u repeat abd xray.  Clinically improving    #AMS: multifactorial, contribution of HE + infection with norovirus and now possibly UTI. Improving.   -continue lactulose, rifaximin   -abx treatment as above  -frequent re-orientation, day/night cycle     #HCV  cirrhosis: status post TIPS (06/2014) with recent revision no 09/14/14. Abdominal ultrasound on admission shows patency of vessels.   --Hepatic encephalopathy: continue lactulose/rifaximin, Improving. defer downsizing of TIPS given that downsizing will likely lead to recurrent re accumulating HHT.   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   --Ascites: lasix 20/aldactone 50. S/p TIPS --> hold diuretics now d/t concern for sepsis  Recurrent hepatic hydrothorax: will ask IR to perform thoracentesis with studies on Monday.  --HCC: CT 06/2014 without lesions c/f HCC, repeat due in May.     #Afib: Initially in RVR but HR now better controlled on oral diltiazem  -continue diltiazem, change to ER once dose determined   -Previous team had extensive discussion with patient and family regarding risk/benefits of anticoagulation and family wanted to pursue [facility] with xarelto however will defer beginning ac at this time given patient is at a high fall risk in setting of acute encephalopathy. Patient and family expressed agreement with this plan and discussion regarding ac will be revisited pending improvement in her encephalopathy.     #  norovirus: diarrhea resolved.   --encourage oral intake, and also contact precautions    #DM2: not well controlled   --cont lantus to 35units and 16units lispro qac per endocrine recs  --add insulin coverage for glucerna supplements   --high intensity SSI     #Osteoporosis  --Continue home calcium and vitamin D    #GERD:   --Continue pepcid 20 mg daily    # moderate malnutrition: appreciate nutrition recs. Advance diet when clinically improves (currently on clears)    VTE PPx: SQ heparin   CODE : Full Code  DISPO: Pending clinical improvement.

## 2014-10-04 NOTE — Progress Notes (Signed)
Endocrine/Diabetes Progress Note    Overnight:  No acute events overnight     Subjective: appetite poor, drinking mostly glucernas    Objective:   Temperature:  [97.6 F (36.4 C)-98.7 F (37.1 C)] 97.6 F (36.4 C) (02/24 1300)  Blood pressure (BP): (105-139)/(31-67) 119/47 mmHg (02/24 1302)  Heart Rate:  [95-118] 99 (02/24 1302)  Respirations:  [17-21] 20 (02/24 1300)  Pain Score:  [-] 0 (02/24 1000)  O2 Device:  [-] None (Room air) (02/24 1000)  SpO2:  [95 %-97 %] 96 % (02/24 1300)  Body mass index is 22.13 kg/(m^2).    GEN: NAD, awake, chronically ill appearing  PULM: breathing comfortably    Diet: full liq carb lim + glucerna TID    Blood Sugars reviewed:        AM  Lu  Di  HS  O/N    02.21  326 371 378 248  Lantus   35  Lispro  0+6  0+6+10  0+3+10   +6    02.22  192 273 220 197  Lantus   35  Lispro  0+6+4 +10 +5    2.23  105 159 149 160  Lantus   35  Lispro   +3    2.24  167 188  Lantus   35  Lispro  8+3 8+4     A/P:  Paula Manning is a 74 year old female with uncontrolled DM2 c/b neuropathy as well as HepC cirrhosis, HTN, glaucoma and migraines admitted 09/24/2014 w/ n/v and AMS.     Remains with poor po intake, diet advanced from 0 carb clears to full liq carb lim. Mostly drinking Glucerna supplements. BGs mostly within range. Will continue to monitor.    Inpatient Recs:  - continue lantus  35 units qday  - resume Lispro at 12 units qac now that diet advances to CL full liquid, RN to give 0, 1/2, whole dose based on % tray consumed  - cont Lispro 8 units per can of Glucerna  - continue high Lispro correction scale q ac/hs    Outpatient Recs:  Was on Lantus 20 units qhs, Januvia 50 mg qday and glipizide 2.5 mg bid w/ A1C 6.2%, but home BG 200-400s in setting of OJ-carrot juice and ++rice/beans intake. Final doses TBD based on inpatient needs.    will cont to follow

## 2014-10-04 NOTE — Plan of Care (Signed)
Problem: Tissue Perfusion, Cardiopulmonary - Altered  Goal: Hemodynamic stability  Outcome: Met  No acute events overnight. The pt is hemodynamically stable. The pt denies chest pain or pressure. RN informed that the blood pressure will be taken automatically.  Problem: Infection  Goal: Absence of infection signs and symptoms  Outcome: Met  The patient is afebrile, vitals are stable.Pt is on antibiotic for infection. Will continue to monitor.  Problem: Pain - Acute  Goal: Control of acute pain  Outcome: Met  The patient denied pain. Will reassess pain level and document result. The patient was instructed regarding pain management control. The patient was encouraged to report increased levels of pain to the nurse. The patient verbalized understanding to instructions and teaching. No complaints of pain noted at this time. Will medicate patient for pain per MD order. Will monitor pain levels.  Problem: Skin Integrity- Intact patient high risk for impaired skin integrity Braden scale less than or equal to 18  Goal: Skin remains intact  Outcome: Met  The patient tolerates being repositioned every two hours. Mepilex applied to pt sacrum. Protective barrier used as needed. Skin is kept clean and dry. No evidence of new skin breakdown.  Problem: Falls, Risk of  Goal: Keep patient free from falls utilizing universal fall precautions  Outcome: Met  Pt hasn't fallen. Assessment complete. Pt at high risk for falls. Pt is AOx4. Bed is the lowest position and brakes are locked into place. Bed alarm on and call light within reach. Pt encouraged to used call light. Will monitor the pt closely. Pt works with PT and OT and walks with FWW.   Problem: Aspiration, Risk of  Goal: Evidence of swallowing difficulties is absent  Outcome: Met  No signs of aspiration. Head of bed at 35 degrees. RN educated the pt on risk for aspiration. The patient verbalized understanding.   Problem: Discharge Planning  Goal: Participation in care  planning  Outcome: Met  The patient AOx4 and able to participate in care. No discharge order at this time. Family and pt are aware of plan of care.

## 2014-10-04 NOTE — Plan of Care (Signed)
Problem: Discharge Planning  Goal: Participation in care planning  Outcome: Not Met  Pt needs further education in regards to d/c planning.  Goal: Verbalizes/demonstrates knowledge of discharge instructions  Outcome: Not Met  Pt and family need further education in regards to d/c instructions.   Goal: Patient is at risk for readmission- risk factors have been addressed  Outcome: Not Met  Pt needs further education for risk factors for readmission to hospital.    Problem: Falls, Risk of  Goal: Keep patient free from falls utilizing universal fall precautions  Outcome: Met  Call light within reach, educated pt/family in regards to calling for assistance. Pt/family verbalize understanding. Pt has also been working with PT.   Goal: Ensure safe mobility of patient (Mobility)  Outcome: Met  Pt has been educated on the use of call light. Pt verbalizes understanding and has been working with PT.   Goal: Minimize the risk of falls due to the effects of prescribed medications (Medications)  Outcome: Met  Will assess the effects of prescribed medications and the risk of falls. Will do frequent rounding.    Problem: Tissue Perfusion, Cardiopulmonary - Altered  Goal: Hemodynamic stability  Outcome: Met  Pt has been hemodynamically stable during shift. Will continue to monitor.   Goal: Early recognition of deterioration  Outcome: Met  Pt has not had any signs/symptoms of deterioration, will continue to monitor.     Problem: Pain - Acute  Goal: Communication of presence of pain  Outcome: Met  Pt has been complaining of minimal pain. Will continue to monitor during shift.   Goal: Control of acute pain  Outcome: Met  Pt has complained of minimal pain, will continue to monitor throughout shift.     Problem: Alteration in Blood Glucose  Goal: Glucose level within specified parameters  Outcome: Met  Pt's BG has been greater than 150, will give appropriate insulin as ordered.     Problem: Infection  Goal: Absence of infection signs and  symptoms  Outcome: Not Met  WBCs are 12.2, will continue to monitor for any other signs/symptoms of infection.     Problem: Skin Integrity- Intact patient high risk for impaired skin integrity Braden scale less than or equal to 18  Goal: Skin remains intact  Outcome: Met  Pt's skin remains intact. Will continue to reposition pt every 2 hours.     Problem: Aspiration, Risk of  Goal: Evidence of swallowing difficulties is absent  Outcome: Met  Pt tolerating clear liquid diet.

## 2014-10-05 DIAGNOSIS — J9811 Atelectasis: Secondary | ICD-10-CM

## 2014-10-05 DIAGNOSIS — J9601 Acute respiratory failure with hypoxia: Secondary | ICD-10-CM

## 2014-10-05 DIAGNOSIS — R1310 Dysphagia, unspecified: Secondary | ICD-10-CM

## 2014-10-05 LAB — PROTHROMBIN TIME, BLOOD
INR: 2.4
PT,Patient: 26.1 s — ABNORMAL HIGH (ref 9.7–12.5)

## 2014-10-05 LAB — ARTERIAL BLOOD GAS
BE, Art: -9 mmol/L — ABNORMAL LOW (ref <=2.3)
Flow Rate: 7 L/min
HCO3, Art: 15 mmol/L — ABNORMAL LOW (ref 20–29)
Temp: 36.9 'C
pCO2, Art (T): 25 mmHg — ABNORMAL LOW (ref 36–46)
pCO2, Art (Uncorr): 25 mmHg (ref 36–46)
pH, Art (T): 7.39 (ref 7.35–7.46)
pH, Art (Uncorr): 7.38 (ref 7.35–7.46)
pO2, Art (T): 64 mmHg — ABNORMAL LOW (ref 74–109)
pO2, Art (Uncorr): 65 mmHg (ref 74–109)

## 2014-10-05 LAB — CBC WITH DIFF, BLOOD
ANC-Automated: 9.1 10*3/uL — ABNORMAL HIGH (ref 1.6–7.0)
Abs Lymphs: 0.3 10*3/uL — ABNORMAL LOW (ref 0.8–3.1)
Abs Monos: 0.8 10*3/uL (ref 0.2–0.8)
Hct: 25.2 % — ABNORMAL LOW (ref 34.0–45.0)
Hgb: 8.7 gm/dL — ABNORMAL LOW (ref 11.2–15.7)
Imm Gran %: 1 % (ref <1)
Imm Gran Abs: 0.1 10*3/uL (ref <0.1)
Lymphocytes: 3 % — ABNORMAL LOW (ref 19–53)
MCH: 33.7 pg — ABNORMAL HIGH (ref 26.0–32.0)
MCHC: 34.5 % (ref 32.0–36.0)
MCV: 97.7 um3 — ABNORMAL HIGH (ref 79.0–95.0)
MPV: 10.1 fL (ref 9.4–12.4)
Monocytes: 8 % (ref 5–12)
Plt Count: 88 10*3/uL — ABNORMAL LOW (ref 140–370)
RBC: 2.58 10*6/uL — ABNORMAL LOW (ref 3.90–5.20)
RDW: 19.9 % — ABNORMAL HIGH (ref 12.0–14.0)
Segs: 89 % — ABNORMAL HIGH (ref 34–71)
WBC: 10.3 10*3/uL — ABNORMAL HIGH (ref 4.0–10.0)

## 2014-10-05 LAB — URINE CULTURE

## 2014-10-05 LAB — GLUCOSE (POCT)
Glucose (POCT): 139 mg/dL — ABNORMAL HIGH (ref 70–115)
Glucose (POCT): 224 mg/dL — ABNORMAL HIGH (ref 70–115)
Glucose (POCT): 193 mg/dL — ABNORMAL HIGH (ref 70–115)
Glucose (POCT): 240 mg/dL — ABNORMAL HIGH (ref 70–115)

## 2014-10-05 LAB — COMPREHENSIVE METABOLIC PANEL, BLOOD
ALT (SGPT): 17 U/L (ref 0–33)
AST (SGOT): 22 U/L (ref 0–32)
Albumin: 3.2 g/dL — ABNORMAL LOW (ref 3.5–5.2)
Alkaline Phos: 101 U/L (ref 35–140)
Anion Gap: 15 mmol/L (ref 7–15)
BUN: 28 mg/dL — ABNORMAL HIGH (ref 6–20)
Bicarbonate: 15 mmol/L — ABNORMAL LOW (ref 22–29)
Bilirubin, Tot: 3.19 mg/dL — ABNORMAL HIGH (ref <1.20)
Calcium: 8 mg/dL — ABNORMAL LOW (ref 8.5–10.6)
Chloride: 107 mmol/L (ref 98–107)
Creatinine: 0.89 mg/dL (ref 0.51–0.95)
GFR: >60 mL/min
Glucose: 132 mg/dL — ABNORMAL HIGH (ref 70–115)
Potassium: 3.4 mmol/L — ABNORMAL LOW (ref 3.5–5.1)
Sodium: 137 mmol/L (ref 136–145)
Total Protein: 5.6 g/dL — ABNORMAL LOW (ref 6.0–8.0)

## 2014-10-05 LAB — PHOSPHORUS, BLOOD: Phosphorous: 2.6 mg/dL — ABNORMAL LOW (ref 2.7–4.5)

## 2014-10-05 LAB — RESPIRATORY CULTURE W/GRAM STAIN: Respiratory Culture Result: NORMAL

## 2014-10-05 LAB — MAGNESIUM, BLOOD: Magnesium: 2.2 mg/dL (ref 1.7–2.6)

## 2014-10-05 MED ORDER — ALBUTEROL SULFATE 108 (90 BASE) MCG/ACT IN AERS
2.0000 | INHALATION_SPRAY | RESPIRATORY_TRACT | Status: DC
Start: 2014-10-05 — End: 2014-10-05

## 2014-10-05 MED ORDER — ALBUTEROL SULFATE (5 MG/ML) 0.5% IN NEBU
INHALATION_SOLUTION | RESPIRATORY_TRACT | Status: AC
Start: 2014-10-05 — End: 2014-10-05
  Administered 2014-10-05: 2.5 mg via RESPIRATORY_TRACT
  Filled 2014-10-05: qty 0.5

## 2014-10-05 MED ORDER — ALBUTEROL SULFATE 108 (90 BASE) MCG/ACT IN AERS
2.0000 | INHALATION_SPRAY | RESPIRATORY_TRACT | Status: DC | PRN
Start: 2014-10-05 — End: 2014-10-05

## 2014-10-05 MED ORDER — SODIUM CHLORIDE 0.9 % IV SOLN
Freq: Once | INTRAVENOUS | Status: AC | PRN
Start: 2014-10-05 — End: 2014-10-06

## 2014-10-05 MED ORDER — FUROSEMIDE 20 MG OR TABS
20.0000 mg | ORAL_TABLET | Freq: Every day | ORAL | Status: DC
Start: 2014-10-05 — End: 2014-10-10
  Administered 2014-10-05 – 2014-10-10 (×6): 20 mg via ORAL
  Filled 2014-10-05 (×6): qty 1

## 2014-10-05 MED ORDER — FUROSEMIDE 10 MG/ML IJ SOLN
40.00 mg | Freq: Once | INTRAMUSCULAR | Status: AC
Start: 2014-10-06 — End: 2014-10-06
  Administered 2014-10-06: 40 mg via INTRAVENOUS
  Filled 2014-10-05: qty 4

## 2014-10-05 MED ORDER — ALBUTEROL SULFATE (5 MG/ML) 0.5% IN NEBU
2.5000 mg | INHALATION_SOLUTION | RESPIRATORY_TRACT | Status: DC
Start: 2014-10-05 — End: 2014-10-07
  Administered 2014-10-05 – 2014-10-06 (×4): 2.5 mg via RESPIRATORY_TRACT
  Filled 2014-10-05 (×4): qty 0.5

## 2014-10-05 MED ORDER — ALBUTEROL SULFATE (5 MG/ML) 0.5% IN NEBU
2.5000 mg | INHALATION_SOLUTION | RESPIRATORY_TRACT | Status: DC | PRN
Start: 2014-10-05 — End: 2014-10-14
  Administered 2014-10-08 – 2014-10-12 (×6): 2.5 mg via RESPIRATORY_TRACT
  Filled 2014-10-05 (×7): qty 0.5

## 2014-10-05 MED ORDER — SPIRONOLACTONE 100 MG OR TABS
100.0000 mg | ORAL_TABLET | Freq: Every day | ORAL | Status: DC
Start: 2014-10-06 — End: 2014-10-11
  Administered 2014-10-06 – 2014-10-11 (×6): 100 mg via ORAL
  Filled 2014-10-05 (×6): qty 1

## 2014-10-05 MED ORDER — FUROSEMIDE 10 MG/ML IJ SOLN
20.00 mg | Freq: Once | INTRAMUSCULAR | Status: AC
Start: 2014-10-05 — End: 2014-10-05
  Administered 2014-10-05: 20 mg via INTRAVENOUS
  Filled 2014-10-05: qty 4

## 2014-10-05 MED ORDER — POTASSIUM CHLORIDE 10 MEQ/100ML IV SOLN
10.00 meq | Freq: Once | INTRAVENOUS | Status: AC
Start: 2014-10-05 — End: 2014-10-05
  Administered 2014-10-05: 10 meq via INTRAVENOUS
  Filled 2014-10-05: qty 100

## 2014-10-05 MED ORDER — INSULIN LISPRO (HUMAN) 100 UNIT/ML SC SOLN (CUSTOM)
12.0000 [IU] | Freq: Three times a day (TID) | INTRAMUSCULAR | Status: DC
Start: 2014-10-05 — End: 2014-10-13
  Administered 2014-10-08 (×2): 6 [IU] via SUBCUTANEOUS
  Administered 2014-10-09: 09:00:00 12 [IU] via SUBCUTANEOUS
  Administered 2014-10-10 – 2014-10-11 (×3): 6 [IU] via SUBCUTANEOUS
  Filled 2014-10-05 (×5): qty 12

## 2014-10-05 NOTE — Progress Notes (Signed)
Inpatient Medicine Progress Note       Interval Events/Subjective:   Pt with increased dyspnea overnight, receivied lasix 20 mg iv x1.  No n/v/f/c.    Vitals signs:     Latest Entry  Range (last 24 hours)    Temperature: 98.3 F (36.8 C)  Temp  Avg: 98 F (36.7 C)  Min: 97.6 F (36.4 C)  Max: 98.3 F (36.8 C)    Blood pressure (BP): 135/62 mmHg  BP  Min: 118/50  Max: 142/54    Heart Rate: 93  Pulse  Avg: 99  Min: 89  Max: 118    Respirations: 13  Resp  Avg: 20  Min: 13  Max: 24    SpO2: 99 %  SpO2  Avg: 97.1 %  Min: 95 %  Max: 99 %       No Data Recorded     Yesterday's intake and output:  02/24 0600 - 02/25 0559  In: 1632 [P.O.:1332; I.V.:300]  Out: -     Physical Exam  GENERAL:  Thin appearing woman in NAD.  Pleasant and cooperative.     HEENT: EOMI. Oropharynx appears moist. No LAD.   CARDIOVASCULAR:  Tachycardic and irregular rhythm.   LUNGS: CTAB  ABDOMEN: Soft, non-distended. Normoactive bowel sounds. Non-tender  EXTREMITIES: Warm and well perfused.   No asterixis     Labs seen and reviewed.     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     #sepsis: suspect from UTI or infected pleural fluid (SBE) vs HCAP, less likely cdiff (cdiff toxin neg, GI panel pending). Wbc rose on ctx but now improved on vanc/zosyn  - cont vanc zosyn  - dc flagyl  - f/u pleural fluid cultures; resend pleural fluid for analysis with repeat thoracentesis   - f/u urine culture -> growing candida, consider treating if worsens    # colonic dilation: less likely 2/2 cdiff as negative studies.  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 F/u repeat abd xray.  Clinically improving    #AMS: multifactorial, contribution of HE + infection with norovirus and now possibly UTI. Improving.      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 Abdominal ultrasound on admission shows patency of vessels.   --Hepatic encephalopathy: continue lactulose/rifaximin, Improving. defer downsizing of TIPS given that downsizing will likely lead to recurrent re accumulating HHT.   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   --Ascites: lasix 20/aldactone 50. S/p TIPS --> hold diuretics now d/t concern for sepsis  Recurrent hepatic hydrothorax: will ask IR to perform thoracentesis with studies on Monday.  --HCC: CT 06/2014 without lesions c/f HCC, repeat due in May.     #Afib: Initially in RVR but HR now better controlled on oral diltiazem  -continue diltiazem, change to ER once dose determined   -Previous team had extensive discussion with patient and family regarding risk/benefits of anticoagulation and family wanted to pursue [facility] with xarelto however will defer beginning ac at this time  given patient is at a high fall risk in setting of acute encephalopathy. Patient and family expressed agreement with this plan and discussion regarding ac will be revisited pending improvement in her encephalopathy.     #norovirus: diarrhea resolved.   --encourage oral intake, and also contact precautions    #DM2: not well controlled   --cont lantus to 35units and 12units lispro qac per endocrine recs  --add insulin coverage for glucerna supplements   --high intensity SSI     #Osteoporosis  --Continue home calcium and vitamin D    #GERD:   --Continue pepcid 20 mg daily    # moderate malnutrition: appreciate nutrition recs.     VTE PPx: SQ heparin   CODE : Full Code  DISPO: Pending clinical improvement.

## 2014-10-05 NOTE — Progress Notes (Signed)
ATTENDING PROGRESS NOTE ATTESTATION  Interval history:  SOB overnight. Got lasix.   See fellow's history and physical for further details of the patient's history.    Objective  I have examined the patient and concur with the fellow's exam.  Labs were reviewed.  Wbc 10K today    Assessment and Plan  I agree with the fellow's care plan.  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 Issues with noncompliance as well as norovirus infection. GIven her rapid improvement of hepatic encephalopathy with rifaximin and lactulose, will defer downsizing the transjugular intrahepatic portosystemic shunt.   -- leukocytosis: improved with broadening to v/z.  presumed UTI+?infected pleural fluid.   -- SOB, cough: repeat CXR today with reacumulation of effusion. Patient outpt wt about 112 lbs so up a few lbs here. Can increase diuretics to 100 spiro/20 lasix. Would try to get thora tomorrow. F/u swallow eval   -- colonic ileus: in setting of infections. Improving.   -- afib: eval after acute issues resolve. would not start it if she continues to have episode of hepatic encephalopathy making her a high fall risk.     See the fellow's history and physical for further details.  [name] MD, PID [id number]

## 2014-10-05 NOTE — Interdisciplinary (Signed)
Speech Therapy Progress Note   Assessment:  Pt presents with decline in respiratory status as compared to previous swallow evaluation. Pt demonstrated a cough 2/4 trials with thin liquids and 1/3 trials of puree. This may be associated with general cough, however aspiration can not be excluded. Furthermore, pt reports increase in cough following po intake. Recommend a video swallow study to fully evaluate pharyngeal phase of swallow and sips/chips only until this can be completed. ST will f/u.    Progress Report  Patient attendance: Pt. is admitted to acute care hospital setting  Patient compliance with therapy program: Good  Treatment focus : Treatment of swallowing dysfunction and/or oral function for feeding  Patient has made : Minimal progress toward achievement of goals  Plan: Continue therapy per MD Rx  Treatment today: Yes  Focus/Plan for next treatment  : 92526  Treatment of swallowing dysfunction and/or oral function for feeding    Evaluation Type: Swallow    Oral Motor Evaluation  Facial Muscles: Intact bilaterally  Dentition: Poor dentition  Buccal cavity: Normal  Lips: Normal  Tongue movement: Normal protrusion  Jaw movement: Normal  Hard palate : Normal  Soft palate: Elevates symmetrically  Velar Movement with /a/: Elevates symmetrically  Vocal quality: Normal    Clinical Bedside Swallow Evaluation  Dysphagia Symptoms : Coughing/choking on liquids  Current diet: PO full liquid  Oral Secretions: Normal  Respirations: Requires O2 (2LNC)  Food/liquid administered during evaluation: Water;Pureed food  Lip seal: Within normal limits  Oral Prep : Intact  Swallow Initiation: Intact;Delayed A-P propulsion 1-5 seconds  Hyolaryngeal elevation: Reduced (Mild)  Oral stasis: No  Swallow efficiency: Within normal limits  Duration of deglutition: Increased  Problems: Other (comments);Decreased stamina/fatigue when eating;Increased time to eat;Poor mastication (General cough, which pt reports increases following po  )  Clinical Exam Recommendations: Initiate Swallow therapy;Video swallow study (Will require additional visit)  Diet recommendations: NPO (may require alternate nutrition) (Until completion of video swallow study.)    Functional Limitation Reporting   G-Codes  Functional Limitations: Swallowing     Plan  The goals listed below are achievable and realistic within the designated time frame and the treatments listed below, and referred to in the treatment plan, are medically necessary to achieve these goals. The functional goals were created based on the patient's prior level of function.    Goals                                                                                                         Progress  Long term goals  Swallow: Swallow: Pt will tolerate least restrictive oral diet without e/o laryngeal aspiration  Visits: 6    Short term goals (Swallow)   Goal: Pt will tolerate pureed diet with thickened liquids without symptoms of laryngeal penetration/aspiration  Visits : 2  Status : Continue  Goal: Pt will utilize compensatory strategies independently  Visits : 4  Status : Continue  Goal: Pt will tolerate ice chips without symptoms of laryngeal penetration/aspiration  Visits : 2  Status : Continue Pt. has  made minimal progress towards achievement of goals   Patient stated goal: water, more fruit                                                                       Treatment Plan  Treatment Plan: 92526  Tx of swallowing dysfunction and/or oral function for feeding  Frequency: 1-3 days/wk     Treatment Today  Treatment Today  92526  Tx of swallow dysfunction and/or oral function for feeding: PO trials;Thin liquid;Puree  Total TIMED Treatment (min) : 0     Objective  RN reported that pt has a general cough, but she took her pills without difficulty. Pt is currently on a full liquid diet texture, although previous speech therapy recommendation was for nectar thick liquids by spoon and puree. Given 4 trials of  thin liquids via cup and puree via spoon x3, pt demonstrated cough x1 with puree and x2 following thin liquids. This may be associated with general cough, however aspiration can not be excluded. Pt also reports increase in coughing following po intake, CXR demonstrates R basal atelectasis/consolidation with L lung grossly clear, as well as high WBC @ 10.3. Hyolaryngeal elevation/excursion deemed mildly reduced upon palpation with intermittent delayed A-P transit, and multiple swallows per bolus. ST discussed with pt and family recommendation of VSS and sips/chips only until this can be completed, they agreed.    Rehab Potential: Good   Discussion and Agreement: Patient and family;MD/RN    Total Treatment Time (min): 20  Total TIMED Treatment (min) : 0

## 2014-10-05 NOTE — Progress Notes (Signed)
Endocrine/Diabetes Progress Note    Overnight:  resp distress overnight, given Lasix    Subjective: breathing better today, appetite better today, drinking mostly glucernas plus some food this am    Objective:   Temperature:  [97.6 F (36.4 C)-98.3 F (36.8 C)] 98.3 F (36.8 C) (02/25 0726)  Blood pressure (BP): (114-142)/(45-62) 114/52 mmHg (02/25 0700)  Heart Rate:  [82-113] 87 (02/25 0830)  Respirations:  [12-24] 18 (02/25 0830)  Pain Score:  [-] 0 (02/25 0726)  O2 Device:  [-] Nasal cannula (02/25 0726)  O2 Flow Rate (L/min):  [2 l/min-3 l/min] 2 l/min (02/25 0726)  SpO2:  [95 %-99 %] 99 % (02/25 0830)  Body mass index is 22.46 kg/(m^2).    GEN: NAD, awake, chronically ill appearing  PULM: breathing comfortably    Diet: full liq carb lim + glucerna TID    Blood Sugars reviewed:        AM  Lu  Di  HS  O/N    02.21  326 371 378 248  Lantus   35  Lispro  0+6  0+6+10  0+3+10   +6    02.22  192 273 220 197  Lantus   35  Lispro  0+6+4 +10 +5    2.23  105 159 149 160  Lantus   35  Lispro   +3    2.24  167 188 192 184  Lantus   35  Lispro  8+3 8+4 8+4 12    02.25  139  Lantus  Lispro  12+8     A/P:  Paula Manning is a 74 year old female with uncontrolled DM2 c/b neuropathy as well as HepC cirrhosis, HTN, glaucoma and migraines admitted 09/22/2014 w/ n/v and AMS.     BG at goal for the most part.  Slightly improved appetite, but mostly drinking Glucerna supplements. Noted that pt had later dinner and received full nutritional dose. Improved fasting BG. Will continue to monitor.    Inpatient Recs:  - continue lantus  35 units qday  - cont Lispro 12 units qac now that diet advances to CL full liquid, RN to give 0, 1/2, whole dose based on % tray consumed  - cont Lispro 8 units per can of Glucerna  - continue high Lispro correction scale q ac/hs    Outpatient Recs:  Was on Lantus 20 units qhs, Januvia 50 mg qday and glipizide 2.5 mg bid w/ A1C 6.2%, but home BG 200-400s in setting of OJ-carrot juice and ++rice/beans  intake. Final doses TBD based on inpatient needs.    will cont to follow

## 2014-10-05 NOTE — Plan of Care (Addendum)
UPDATE: Continues to have loose incontinent stools. Placed on air mattress today. Had increasing difficulty breathing. Placed on nonrebreather for comfort. Given extra doses of lasix.     Problem: Discharge Planning  Goal: Participation in care planning  Pt and family fully participative in care/ discharge planning.     Problem: Falls, Risk of  Goal: Keep patient free from falls utilizing universal fall precautions  Pt and family call appropriately for assistance.     Problem: Tissue Perfusion, Cardiopulmonary - Altered  Goal: Hemodynamic stability  VSS. Remains afebrile.     Problem: Pain - Acute  Goal: Communication of presence of pain  Pt has prn medication available for abdominal pain.     Problem: Alteration in Blood Glucose  Goal: Glucose level within specified parameters  AC/HS blood glucose monitoring. Sliding scale and scheduled insulin in place.     Problem: Skin Integrity- Intact patient high risk for impaired skin integrity Braden scale less than or equal to 18  Goal: Skin remains intact  Dressing to L forearm.    Problem: Breathing Pattern - Ineffective  Goal: Respiratory rate, rhythm and depth return to baseline  Continues to cough spontaneously. Coughing up this white sputum.

## 2014-10-05 NOTE — Interdisciplinary (Signed)
Patient with newly diagnosed pneumonia and fluid overload yesterday. Given Lasix IV and started on antibiotics.   Speech to do another swallow evaluation.   Case manager will continue to follow for discharge planning, to SNF when medically stable for discharge.

## 2014-10-05 NOTE — Progress Notes (Signed)
Hepatology Progress Note  Consult Attending: Dr. Marykay Lex, MD    24h events:    - found sob last night, lasix 20 mg iv x1 was given. CXR with moderate right pleural effusion, increased from radiograph 09/21/2014, evidence of increased intravascular volume.      Past Medical History:  Past Medical History   Diagnosis Date    Migraine headache     Glaucoma     Ascites 06/29/2007     History of paracentesis x 2    Pancreatic cyst 06/29/2007    Cirrhosis ([place]) 06/24/2007     Active on the liver transplant waiting list with a blood type of O positive. 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 Due to Hep C    Osteopenia 06/24/2007    Emphysema 06/24/2007    EV (esophageal varices) ([place]) 06/10/2007    Anemia 06/10/2007    HCV (hepatitis C virus) 06/10/2007     Achieved SVR.    Type II or unspecified type diabetes mellitus with peripheral circulatory disorders, not stated as uncontrolled(250.70) 06/10/2007    HTN 06/10/2007    HTN 06/10/2007       Past Surgical History:   Past Surgical History   Procedure Laterality Date    Pb laps surg cholecstc w/expl common duct  1970s     in [place];  open CCX    Pb cesarean delivery only      Pb cstoplasty/cstourtp plstc any       Bladder suspension    Egd procedure  06/01/2013     Procedure: GI EGD;  Surgeon: Jeanette Caprice, MD;  Location: [facility];  Service: Gastroenterology;;    Tips procedure         Allergies:  No Known Allergies    Home Medications:  Prescriptions prior to admission   Medication Sig Dispense Refill Last Dose    alendronate (FOSAMAX) 70 MG tablet    09/22/2014    ALPHAGAN P 0.1 % ophthalmic solution    09/22/2014    bacitracin 500 UNIT/GM ointment Apply small amount to affected area on face twice daily 1 Tube 1 09/22/2014    CALCIUM CARBONATE 600 MG OR TABS 1 tablet 2 times daily one month 11 09/22/2014    ciprofloxacin (CIPRO) 500 MG tablet Take 1 tablet by mouth daily. 30 tablet 5 09/22/2014    furosemide (LASIX) 20 MG tablet Take 2  tablets by mouth daily. 90 tablet 0 09/22/2014    glipiZIDE (GLUCOTROL) 5 MG tablet Take 0.5 tablets (2.5 mg) by mouth 2 times daily. 30 tablet 1 09/22/2014    insulin glargine (LANTUS) 100 UNIT/ML injection Inject 20 Units under the skin daily. 10 mL 1 09/22/2014    lactulose 10 GM/15ML solution Take 30 mLs (20 g) by mouth 3 times daily. 946 mL 0 09/22/2014    LUMIGAN 0.01 %    09/22/2014    multivitamin (MULTI-VITAMIN) tablet Take 1 tablet by mouth daily.   09/22/2014    ranitidine (ZANTAC) 300 MG tablet Take 300 mg by mouth 2 times daily.   09/22/2014    rifaximin (XIFAXAN) 550 MG tablet Take 550 mg by mouth every 12 hours.   09/22/2014    sitagliptin (JANUVIA) 50 MG tablet Take 1 tablet (50 mg) by mouth daily. 60 tablet 1 09/22/2014    spironolactone (ALDACTONE) 50 MG tablet Take 1 tablet (50 mg) by mouth daily. 30 tablet 0 09/22/2014    VITAMIN D 400 UNIT OR CAPS 1 CAPSULE DAILY 30 11 09/22/2014  Inpatient Medications:  Scheduled Meds   cholecalciferol  400 Units Daily    diltiazem  45 mg Q6H    famotidine  20 mg Daily    furosemide  20 mg Daily    insulin glargine  35 Units Daily    insulin lispro  1-10 Units 4x Daily WC    insulin lispro  8 Units TID WC    lactulose  20 g BID    multivitamin  5 mL Daily    piperacillin-tazobactam (ZOSYN) IVPB  3,375 mg Q8H    potassium chloride  10 mEq Once    rifaximin  550 mg Q12H    sodium chloride (PF)  3 mL Q8H    [START ON 09/30/2014] spironolactone  100 mg Daily    vancomycin (VANCOCIN) IVPB  500 mg Q8H     IV Meds   sodium chloride       Social History:   Lives with husband. Has 10 children. Was a homemaker.  Denies alcohol, drugs, tobacco.     Review of Systems: -   Constitutional: Denies fevers, chills, weight loss  ENT: negative for eye changes, oral lesions, difficulty swallowing  Cardiac: Denies chest pain or palpitations  Lungs: Denies cough or shortness of breath  GI: Negative except noted in the HPI  GU: Denies dysuria, hematuria  Skin:  Denies rashes or other skin lesions  MSK: Denies joint deformities, new joint pain  Lymph: Denies enlarged nodes, night sweats, abnormal bleeding  Psych: negative for depressed mood, anxiety  Neuro: negative for confusion, weakness, sensory changes    OBJECTIVE:   Vitals signs:   Latest Entry  Range (last 24 hours)    Temperature: 98.6 F (37 C)  Temp  Avg: 98.2 F (36.8 C)  Min: 97.6 F (36.4 C)  Max: 98.6 F (37 C)    Blood pressure (BP): 131/54 mmHg  BP  Min: 110/53  Max: 142/54    Heart Rate: 91  Pulse  Avg: 93  Min: 82  Max: 113    Respirations: 16  Resp  Avg: 16.7  Min: 12  Max: 24    SpO2: 100 %  SpO2  Avg: 98.4 %  Min: 95 %  Max: 100 %       No Data Recorded     Weight - scale: 53.9 kg (118 lb 13.3 oz)  Percentage Weight Change (%): 1.51 %    Intake/Output       10/04/14 0600 - 10/05/14 0559 10/05/14 0600 - 09/16/2014 0559      [id number] [phone] Total 0600-1759 5643-3295 Total       Intake    P.O.  957  375 1332  587  -- 587    I.V.  --  300 300  --  -- --    Total Intake [phone] 587 -- 587       Output    Total Output -- -- -- -- -- --       Net I/O     [phone] 587 -- 587        Respiratory support:  Oxygen Therapy  SpO2: 100 %  O2 Device: Nasal cannula  O2 Flow Rate (L/min): 2 l/min     Physical Exam:  General:  Well developed, thin elderly Hispanic female in no apparent distress, somnolent, a&o to person and place (never oriented to date per son)  HEENT:  No scleral icterus, Trachea midline, mucus membranes moist.  Lungs:  Cardiovascular:  Irregularly irregular  Abdomen:  Abdomen less tense, non tender to palpation, bowel sound present. No peritoneal signs.  Extremities:  Trace peripheral edema. Warm well-perfused.  Skin:  Non-icteric and no visible rash  Neuro:  Cranial nerves II-XII grossly intact. Mild asterixis    Labs:     CBC  Recent Labs      10/03/14   0459  10/04/14   0455  10/05/14   0458   WBC  21.6*  12.2*  10.3*   HGB  9.5*  8.8*  8.7*   HCT  27.2*  25.9*  25.2*   PLT  108*   79*  88*   BAND   --   17*   --    SEG  88*  78*  89*   LYMPHS  4*   --   3*   MONOS  7  4*  8      Chemistry  Recent Labs      10/04/14   0455  10/05/14   0458   NA  136  137   K  3.7  3.4*   CL  105  107   BICARB  13*  15*   BUN  29*  28*   CREAT  0.78  0.89   GLU  169*  132*   [place]  8.5  8.0*   MG  2.3  2.2   PHOS  2.7  2.6*     Recent Labs      10/04/14   0455  10/05/14   0458   ALK  90  101   AST  22  22   ALT  18  17   TBILI  4.51*  3.19*   ALB  3.9  3.2*        Coags  Recent Labs      10/04/14   0455  10/05/14   0458   PT  26.9*  26.1*   INR  2.5  2.4       Radiology:   CT A/P w/ contrast 09/25/14: FINDINGS:  LUNG BASES: Small to moderate right pleural effusion with right lower lobe   atelectasis  LIVER/BILIARY:Diffuse nodularity and hepatic atrophy. Stable scattered tiny   low attenuation lesions in the liver.  PANCREAS: Stable numerous pancreatic cysts.  SPLEEN: Unremarkable.  ADRENAL GLANDS: Unremarkable  KIDNEYS: Stable left renal cyst.  STOMACH/DUODENUM: Mild wall thickening from the distal stomach to the proximal  jejunum  VASCULATURE: Patent TIPS. Stable nonocclusive thrombus of the main portal vein  and superior mesenteric vein. New thrombosis of the left portal vein.  LYMPHATIC: Unremarkable  SMALL & LARGE BOWEL: Unremarkable  BLADDER/PELVIC ORGANS: Unremarkable  BONES/SOFT TISSUES: Unremarkable  OTHER: Trace pelvic ascites    IMPRESSION:  1) New thrombosis of the left portal vein. Vessel was patent on prior   abdominal ultrasound 09/24/2014  2) Edema of the distal stomach through proximal jejunum suggestive of   enteritis, nonspecific    CT head 09/22/2014: IMPRESSION:  1. No evidence of acute intracranial hemorrhage or findings to suggest   territorial/transcortical infarct.  2. Mild white matter changes. These may be associated with prior small vessel   ischemia. If there is concern for encephalopathy, MRI would be more helpful   for acute evaluation. MRI screening: No intracranial or intraorbital  metal.  3. Frothy secretions within the right sphenoid sinuses well as reactive bone   changes, suggestive of acute on chronic sinusitis.    RUQ U/S  09/15/2014: FINDINGS:  The liver measures 11.3 cm in long axis. It is normal in echogenicity. No   focal lesions are identified. There is no intra or extra hepatic bile duct   dilation. The common bile duct measures four mm. The gallbladder is not   visualized. A patent TIPS catheter is present. The TIPS velocities are: Portal  end 115 ; mid 93 ; hepatic 125 cm/s. Flow in the right and left portal veins   is hepatofugal and towards the TIPS ; flow in the main portal vein is   hepatopetal.  The right kidney measures nine cm in long axis and is within normal limits   with no hydronephrosis or renal calculi.  The visualized portion of the pancreas again demonstrates multiple cystic   lesions throughout the pancreas, as seen on prior imaging  The visualized aorta and inferior vena cava are within normal limits.  Right pleural effusion. No evidence of intra abdominal ascites.    IMPRESSION:  Patent TIPS catheter with normal velocities and appropriate flow in portal   veins.  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    cxr 10/05/14    Moderate right pleural effusion, increased from radiograph 09/19/2014.   Associated right basal atelectasis.  Increased prominent of the right heart. Dilated azygos arch representing   increased intravascular volume.  No other change.      Prior Endoscopy:   EGD 05/2013:  Findings: 1. Midline orophyarngeal prominence at the  arytenoid  2. Three columnns of grade 1 esophageal varices, no high  risk features  3. No gastric varices, mild portal hypertensive gastropathy  4. Few punctate clots in the antrum  5. Mild duodenal congestion consistent with portal  hypertension  Endoscopic Diagnosis: 1. Midline orophyarngeal  prominence at the arytenoid  2. Three columnns of  grade 1 esophageal varices, no high  risk features  3. No gastric varices, mild portal hypertensive gastropathy  4. Few punctate clots in the antrum  5. 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 Continue propranolol 10 BID  2. ENT consultation to evaluate oropharyngeal findings  3. Repeat EGD in 1 year for varices screening        CT scan of the abdomen and pelvis without IV contrast 10/03/14    Compared to the prior CT abdomen pelvis of 09/25/2014, there is new gaseous   distention of the large bowel. No evidence of bowel obstruction, transition   point or pneumatosis intestinalis. There has been interval resolution of   proximal jejunal bowel wall thickening. No other significant changes compared   to the prior study.    Note that assessment of solid organs is limited in the absence of intravenous   Contrast.    CXR 10/05/14    IMPRESSION:  Moderate right pleural effusion, increased from radiograph 09/24/2014.   Associated right basal atelectasis.  Increased prominent of the right heart. Dilated azygos arch representing   increased intravascular volume.  No other change.    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 Confusion associated with poor medication compliance, including lactulose,  N/V, abd pain, diarrhea, and f/c/s. HDS. Work-up here showed GI pathogen panel with norovirus and recent UA consistent with UTI.     #AMS: Resolved. Likely precipitated by infection from UTI and norovirus infection.   -deferring previous plans to downsize TIPS given several infectious precipitants and TIPS downsizing will increase rate of pleural effusion accumulation.    #  Sepsis/abdominal pain  Patient with improved abdominal pain and bloating today. X ray found with colonic ileus, up 7.9 cm colon 09/13/2014. Improved leukocytosis. Thoracentesis with 1400 wbc/70% neutrophils.    - started vanco zosyn (02/23-), need to continue to complete 7 days for treating possible urinary or pulmonary source  - cont supportive care for norovirus  - dc flagyl IV for possible c diff infection  - clear liquid diet, NPO if clinically worse  - ct non con:L gaseous distention, no transition point    #SOB  With increased pulmonary edema on the last CXR. And with increased right pleural effusion.  - diurese with IV lasix  - thoracentesis 10/08/2014 with 1400 wbc and 70% neutrophils  - repeat thoracentesis today, check cell count, protein, ldh, cultures    #)Norovirus: likely responsible for initial N/V, abd pain and diarrhea on presentation  - Follow up blood cultures: NGTD  - Supportive care with IVF and anti-emetics as needed    #)Afib w rvr  S/p diltiazem, now rate controlled. CHADS score 2, chads vsc 4. High risk of fall due to her hepatic encephalopathy, highly concerned for the possibility of anticoagulation. We would hold anticoagulation at this time.  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 and TIPS revision 09/14/14 MELD 23    * HE: still encephalopathic but improved, likely related to infection and TIPS revision,    - Continue lactulose enemas, or po titrate for 3-4 soft bowel movements a day and rifaximin  * Ascites: lasix 20 mg po and spironolactone 100 mg po for increased sob and volume overload (extra ov lasix as needed)  * EV's: S/p TIPS, no longer needs surveillance if TIPS is patent and she is stable  * HHT: S/p TIPS and TIPS revision   - RUQ U/S 2/13 with patent TIPS  * Non-occlusive portal vein thrombosis: Multiple outpatient notes indicate poor medication adherence/knowledge, remains fall risk as  well.  Risks outweigh benefits  * [place] surveillance: Last CT on 06/27/2014 showed no HCC. Repeat imaging in May 2016.  * Transplant: Age >70, not a candidate    DWA Dr. Lilly Cove, MD  [facility]

## 2014-10-05 NOTE — Interdisciplinary (Signed)
Attempted to see pt this PM, but family and pt politely declined PT at this time 2/2 pt reports of fatigue/tiredness and SOB. Family and pt agreeable to participate in PT tomorrow. Will try to see pt at another time as staffing and time permit. Thank you.

## 2014-10-05 NOTE — Plan of Care (Signed)
Problem: Tissue Perfusion, Cardiopulmonary - Altered  Goal: Hemodynamic stability  Outcome: Met  The pt is hemodynamically stable.  The pt denies chest pain or pressure. RN informed that the blood pressure will be taken automatically.  Problem: Infection  Goal: Absence of infection signs and symptoms  Outcome: Met  The patient is afebrile, vitals are stable.Pt is on antibiotic for infection. Will continue to monitor.  Problem: Pain - Acute  Goal: Control of acute pain  Outcome: Met  The patient denied pain. Will reassess pain level and document result. The patient was instructed regarding pain management control. The patient was encouraged to report increased levels of pain to the nurse. The patient verbalized understanding to instructions and teaching. No complaints of pain noted at this time. Will medicate patient for pain per MD order. Will monitor pain levels.  Problem: Skin Integrity- Intact patient high risk for impaired skin integrity Braden scale less than or equal to 18  Goal: Skin remains intact  Outcome: Met  The patient tolerates being repositioned every two hours. Mepilex applied to pt sacrum. Protective barrier used as needed. Skin is kept clean and dry. No evidence of new skin breakdown.  Problem: Falls, Risk of  Goal: Keep patient free from falls utilizing universal fall precautions  Outcome: Met  Pt hasn't fallen. Assessment complete. Pt at high risk for falls. Pt is AOx4. Bed is the lowest position and brakes are locked into place. Bed alarm on and call light within reach. Pt encouraged to used call light. Will monitor the pt closely. Pt works with PT and OT and walks with FWW.   Problem: Aspiration, Risk of  Goal: Evidence of swallowing difficulties is absent  Outcome: Met  Pt is for Speech eval. No signs of aspiration. Head of bed at 35 degrees. RN educated the pt on risk for aspiration. The patient and family verbalized understanding.  Problem: Discharge Planning  Goal: Participation in care  planning  Outcome: Met  Pt is not a candidate for discharge at this time. Family made aware of plan of care.

## 2014-10-05 NOTE — Plan of Care (Signed)
Problem: Breathing Pattern - Ineffective  Goal: Respiratory rate, rhythm and depth return to baseline  Outcome: Not Met  Pt c/o sob while at rest. V/S 107/48 HR=80 O2=95% on 3L NC, RR=20. Increased O2 from 3L to 4L NC for comfort. Sats now at 96%, RR=20. Pt's family states that pt is feeling anxious. Paged on call MD to notify of pt's condition and requesting medication to help ease pt's anxiety. Awaiting response from MD.

## 2014-10-05 NOTE — Plan of Care (Signed)
Problem: Breathing Pattern - Ineffective  Goal: Respiratory rate, rhythm and depth return to baseline  No signs of respiratory distress at this time. RR 14-18. O2 sat at >97% on 3L. Pt has good urine output and breathing unlabored after Lasix was given. No signs of respiratory distress at this time. Will continue to monitor.

## 2014-10-05 NOTE — Interdisciplinary (Signed)
Attempted to see patient for OT therapy session, pt reported that she was too tired and requested therapist to return in the AM.

## 2014-10-05 NOTE — Progress Notes (Signed)
Critical Care Note/RRT Note  Reason for evaluation:     HPI/Narrative:         Paula Manning is a 74 year old female with a history of HCV Cirrhosis, A.fib admitted for AMS.   ~ 2310 Pt developed SOB with increased O2 requirements. RR mid 20s-30, O2 increased to 7 L NC (from 3 L  At baseline).   Pt in mild distress, dry cough +. Stat ABG, Lasix IV, EKG, O2 obtained. D/w family and given UTI- Foley was deferred for now.   D/w Radiology concern for mucus plugging + worsening pleural effusion on Right. D/w RN. Pt more comfortable. Would monitor O@ sats and resp. Status closely.       Physical exam         Temperature:  [98.3 F (36.8 C)-98.6 F (37 C)] 98.6 F (37 C) (02/25 1200)  Blood pressure (BP): (110-138)/(45-64) 126/60 mmHg (02/25 1700)  Heart Rate:  [81-102] 85 (02/25 1830)  Respirations:  [12-23] 23 (02/25 1830)  Pain Score:  [-] 0 (02/25 1630)  O2 Device:  [-] Nasal cannula (02/25 1645)  O2 Flow Rate (L/min):  [2 l/min-3 l/min] 2 l/min (02/25 1645)  SpO2:  [90 %-100 %] 93 % (02/25 1830)    Physical Exam:  Vitals reviewed  Gen: Pt in bed in mild distress  HENT - Mucous membranes moist.  NECK -  JVP not raised  Resp - Dec BS on Right; coarse BS throughout, basal crepts   CVS - Irreg Irreg, No murmurs , S1, S2 +, no thrill  Abd - Benign , NT/ND, +BS, No organomegaly, No rebound tenderness, No Guarding  Ext - Pitting edema until knees R>L  Neuro - moving all four extremities  Psych - speaks Spanish only- responding appropriately      Labs   Lab Results   Component Value Date    NA 137 10/05/2014    K 3.4* 10/05/2014    CL 107 10/05/2014    BICARB 15* 10/05/2014    BUN 28* 10/05/2014    CREAT 0.89 10/05/2014    GLU 132* 10/05/2014    [place] 8.0* 10/05/2014     Lab Results   Component Value Date    WBC 10.3* 10/05/2014    HGB 8.7* 10/05/2014    HCT 25.2* 10/05/2014    PLT 88* 10/05/2014    SEG 89* 10/05/2014    BAND 17* 10/04/2014    LYMPHS 3* 10/05/2014    MONOS 8 10/05/2014    EOS 1 10/04/2014     Lab  Results   Component Value Date    INR 2.4 10/05/2014     No results found for: ARTPH, ARTPO2, ARTPCO2  No results found for: CPK, CKMBH, TROPONIN  Results for LETZY, GULLICKSON (MRN [id number]) as of 10/05/2014 23:35   Ref. Range 10/05/2014 23:36   Temp Latest Units: 'C 36.9   Art Site Unknown Arterial   pH, Art (T) Latest Ref Range: 7.35-7.46  7.39   pCO2, Art (T) Latest Ref Range: 36-46 mmHg 25 (L)   pO2, Art (T) Latest Ref Range: 74-109 mmHg 64 (L)   HCO3, Art Latest Ref Range: 20-29 mmol/L 15 (L)   BE, Art Latest Ref Range: -2.4-2.3 mmol/L -9.0 (L)   pH, Art (Uncorr) Latest Ref Range: 7.35-7.46  7.38   pCO2, Art (Uncorr) Latest Ref Range: 36-46 mmHg 25   pO2, Art (Uncorr) Latest Ref Range: 74-109 mmHg 65       EKG  A.fib, low voltage, rate 82, No acute st /t changes.    Imaging - I have personally reviewed CXR, radiology report reviewed as well.  White out lung on Right + tracheal deviation= Pleural effusion + ?atelectasis; cannot r/o volume overload  D/w Radiology    ASSESSMENT AND PLAN      Acute respiratory failure with hypoxia:   - Suspect pulm edema, similar presentation last night.   - CXR, ABG, Lasix 40 mg IV x1 would f/u response  - Given UTI avoiding foley but if needed may have to place it.   - continue Abx as previously Zosyn+ Vanco  - Close monitoring of the resp. Status and consider Bipap if no response.   - CXR concerning for worsening pleural effusion/Mucus plugging. Frequent suctioning, NPO for now. If O2 sats are not improved- would repeat CXR  - FYIed ICU Re: any detioration in resp, status     Disposition - Retained on the floor (IMU)    I spent 35 minutes (2325-0020) personally attending to this patient's critical care needs. This critical care time is exclusive of any time for separately billable procedures. This patient is critically ill/injured as a result of acute respiratory failure and I personally performed (or personally supervised) the management and interventions.       Oneita Kras,  MD, [facility] Medicine   [phone]

## 2014-10-05 NOTE — Interdisciplinary (Signed)
Speech Language Pathology Video Swallow Study    Assessment   Pt presents with mild-moderate oropharyngeal dysphagia c/b hold up of barium pill in the pharynx 2/2 large anterior cervical osteophyte at the level of C4-C5. Penetration of thin liquids occurred when attempting to clear pill. No airway endangerment noted with thin liquids (spoon/cup/straw), nectar thick liquids, and puree. No solid trials attempted d/t pt's family reporting she has had difficulty with mastication 2/2 poor dentition, thus she avoids solid foods. Pt exhibited intermittent cough throughout assessment despite lack of airway endangerment. Recommend changing diet texture to pureed solids and thin liquids with pills given crushed in puree. 1:1 assistance needed and aspiration precautions in-place including HOB elevated, small bites/sips, slow rate, and alternate solids/liquids. ST will f/u re: diet tolerance and recommendations as warranted.    Evaluation Type: Swallow    Patient History   Treatment Start Time : 1884  Onset date (this episode): 09/30/2014 (DFamily report no dysphagia prior to admit date)  Patient history: Pt. 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 Hospital course complicated by afib with RVR. ST here for bedside swallow evaluation epr MD order 2/2 failing water swallow screen with RN. RN reports coughing with liquids yesterday, confusion and mental status fluctuates per RN and family report. Family deny any h/o dysphagia prior to this admit. ST arrived with [facility], stated preference for use of MAARTI however family deferred its use, state preference to translate directly for pt. Pt. able to follow simple commands in Spanish and participate in evaluation tasks with demo, family translation utilized further as needed.   Prior level of function: No  deficits  Has assistance: Yes  Living situation: Lives with family;Married  Type of evaluation: 92610 Evaluation of oral & pharyngeal swallowing function  Equipment Present: NG/Keo feed tube;IV  Pain Rating (0-10): 0  Pain Location: denied, reports discomfort with swallow 2/2 NG tube    Video Swallow Evaluation  Oral Phase: Normal  Velar Function: Normal  Base of tongue retraction: Normal  Hyolaryngeal elevation: Normal  Epiglottic movement: Incomplete inversion (2/2 osteophyte)  Pharyngeal wall (PW) movement: Anterior cervical osteophytes  Esophageal sphincter (UES): All boluses passed through cervical esophagus  Esophagus: Not evaluated  Thin liquid boluses: Normal;Trace residue;Vallecular residue;Pyriform sinus spillage;Residue clears with multiple swallows  Nectar thick liquid boluses: Normal;Mild residue;Vallecular residue;Pyriform sinus spillage;Residue clears with multiple swallows  Pureed Solids: Moderate residue;Residue clears with multiple swallows  Barium sulfate pill: Hold up in pyriform - cleared with liquid swallow (Hold up in between epiglottis and osteophyte)  Penetration/Asp Score: 3 - Material enters airway, remains above VFs, not ejected from airway  Impression: Pharyngeal phase dysphagia  Diet recommendations: Regular liquids PO;Pureed diet PO  Problems: Signs/symptoms of laryngeal penetration;Coughing/choking on solid foods;Poor mastication    Functional Limitation Reporting   G-Codes  Functional Limitations: Swallowing  Swallow Current Status (Z6606): CK 40%-59% impaired, limited or restricted  Swallow Goal Status (T0160): CJ 20-39% impaired, limited or restricted     Plan  The goals listed below are achievable and realistic within the designated time frame and the treatments listed below, and referred to in the treatment plan, are medically necessary to achieve these goals. The functional goals were created based on the patient's prior level of function.    Goals  Long term goals  Swallow: Swallow: Pt will tolerate least restrictive oral diet without e/o laryngeal aspiration  Visits: 6   Short term goals (Swallow)   Goal: Pt will tolerate pureed diet with regular liquids without symptoms of laryngeal penetration/aspiration  Visits : 2  Status : Revise  Goal: Pt will utilize compensatory strategies independently  Visits : 4  Status : Continue  Goal: Pt will tolerate ice chips without symptoms of laryngeal penetration/aspiration  Visits : 2  Status : Met     Patient stated goal: water, more fruit       Treatment Plan  Treatment Plan: 92526  Tx of swallowing dysfunction and/or oral function for feeding  Frequency: 1-3 days/wk     Treatment Today  92526  Tx of swallow dysfunction and/or oral function for feeding: PO trials;Thin liquid;Puree;Nectar thick liquid;Patient/family education (Barium sulfate pill)  Total TIMED Treatment (min) : 0     Objective : Pt received sitting upright at 90 degrees in a video swallow study chair given various barium consistencies in conjunction with radiology.     Oral phase: Pt presents with adequate A-P transit, bolus formation, and lingual elevation during po trials. Solid food consistencies not attempted d/t poor dentition and family reports pt has not been eating solid foods.     Pharyngeal phase: Large osteophyte observed at the level of C4-C5. This made contact with epiglottis during swallows impeding epiglottic inversion. A timely swallow and no airway endangerment was observed with thin liquids via (spoon, cup, straw), nectar thick liquids via straw, and puree. Trace-minimal residue observed in the valleculae and pyriform sinuses following thin and nectar thick liquid trials. This was cleared with an additional swallow. Moderate residue was noted to sit atop the osteophyte following initial swallow of puree. This was reduced with a second swallow and fully cleared with a thin liquid  rinse. During a barium sulfate pill trial, pt demonstrated hold up of pill in between the epiglottis and osteophyte. This was pushed down to the pyriform sinuses with a liquid wash. Mild penetration of thin liquid occurred at this time. During an additional thin liquid and puree wash, the pill was cleared. Intermittent coughing and throat clearing was noted throughout the assessment despite lack of airway endangerment.     Esophageal phase: All boluses passed through the UES and no reflux was observed.    ST discussed recommendations and reviewed video swallow study results with pt and family. RN and MD contacted.    Rehab Potential: Good   Discussion and Agreement: Patient and family;MD/RN    Total Treatment Time (min): 30  Total TIMED Treatment (min) : 0

## 2014-10-05 NOTE — Interdisciplinary (Signed)
Attempted to see patient for OT therapy session, pt off the floor having a swallow study. Will re-attempt later this PM, time permitting.

## 2014-10-06 ENCOUNTER — Encounter (HOSPITAL_COMMUNITY): Admission: EM | Disposition: E | Payer: Self-pay | Attending: Internal Medicine

## 2014-10-06 DIAGNOSIS — M7989 Other specified soft tissue disorders: Secondary | ICD-10-CM

## 2014-10-06 LAB — BASIC METABOLIC PANEL, BLOOD
Anion Gap: 15 mmol/L (ref 7–15)
Anion Gap: 14 mmol/L (ref 7–15)
BUN: 28 mg/dL — ABNORMAL HIGH (ref 6–20)
BUN: 25 mg/dL — ABNORMAL HIGH (ref 6–20)
Bicarbonate: 15 mmol/L — ABNORMAL LOW (ref 22–29)
Bicarbonate: 17 mmol/L — ABNORMAL LOW (ref 22–29)
Calcium: 7.9 mg/dL — ABNORMAL LOW (ref 8.5–10.6)
Calcium: 7.8 mg/dL — ABNORMAL LOW (ref 8.5–10.6)
Chloride: 103 mmol/L (ref 98–107)
Chloride: 102 mmol/L (ref 98–107)
Creatinine: 0.98 mg/dL — ABNORMAL HIGH (ref 0.51–0.95)
Creatinine: 0.96 mg/dL — ABNORMAL HIGH (ref 0.51–0.95)
GFR: 56 mL/min
GFR: 57 mL/min
Glucose: 233 mg/dL — ABNORMAL HIGH (ref 70–115)
Glucose: 269 mg/dL — ABNORMAL HIGH (ref 70–115)
Potassium: 3.1 mmol/L — ABNORMAL LOW (ref 3.5–5.1)
Potassium: 3.6 mmol/L (ref 3.5–5.1)
Sodium: 133 mmol/L — ABNORMAL LOW (ref 136–145)
Sodium: 133 mmol/L — ABNORMAL LOW (ref 136–145)

## 2014-10-06 LAB — PREPARE FFP/THAWED PLASMA: Fresh Frozen Plasma: TRANSFUSED

## 2014-10-06 LAB — FIBRINOGEN, BLOOD: Fibrinogen: 121 mg/dL — ABNORMAL LOW (ref 200–450)

## 2014-10-06 LAB — BODY FLUID CELL CT / DIFF
Fluid RBC mm3: 4000 uL
Fluid WBC mm3: 709 uL
Lymphocytes fluid %: 18 %
Macrophages fluid %: 62 %
Mesothelial Cells Fluid %: 9 %
Neutrophils Fluid %: 11 %

## 2014-10-06 LAB — PH, OTHER: Ph, Other: 7.43

## 2014-10-06 LAB — LDH, BLOOD: LDH: 272 U/L — ABNORMAL HIGH (ref 135–214)

## 2014-10-06 LAB — COMPREHENSIVE METABOLIC PANEL, BLOOD
ALT (SGPT): 15 U/L (ref 0–33)
AST (SGOT): 20 U/L (ref 0–32)
Albumin: 3.1 g/dL — ABNORMAL LOW (ref 3.5–5.2)
Alkaline Phos: 104 U/L (ref 35–140)
Anion Gap: 15 mmol/L (ref 7–15)
BUN: 27 mg/dL — ABNORMAL HIGH (ref 6–20)
Bicarbonate: 16 mmol/L — ABNORMAL LOW (ref 22–29)
Bilirubin, Tot: 2.71 mg/dL — ABNORMAL HIGH (ref <1.20)
Calcium: 7.9 mg/dL — ABNORMAL LOW (ref 8.5–10.6)
Chloride: 101 mmol/L (ref 98–107)
Creatinine: 0.98 mg/dL — ABNORMAL HIGH (ref 0.51–0.95)
GFR: 56 mL/min
Glucose: 204 mg/dL — ABNORMAL HIGH (ref 70–115)
Potassium: 3.2 mmol/L — ABNORMAL LOW (ref 3.5–5.1)
Sodium: 132 mmol/L — ABNORMAL LOW (ref 136–145)
Total Protein: 5.6 g/dL — ABNORMAL LOW (ref 6.0–8.0)

## 2014-10-06 LAB — CBC WITH DIFF, BLOOD
ANC-Automated: 8.8 10*3/uL — ABNORMAL HIGH (ref 1.6–7.0)
Abs Lymphs: 0.4 10*3/uL — ABNORMAL LOW (ref 0.8–3.1)
Abs Monos: 0.7 10*3/uL (ref 0.2–0.8)
Hct: 24.4 % — ABNORMAL LOW (ref 34.0–45.0)
Hgb: 8.6 gm/dL — ABNORMAL LOW (ref 11.2–15.7)
Imm Gran %: 1 % (ref <1)
Imm Gran Abs: 0.1 10*3/uL (ref <0.1)
Lymphocytes: 4 % — ABNORMAL LOW (ref 19–53)
MCH: 34 pg — ABNORMAL HIGH (ref 26.0–32.0)
MCHC: 35.2 % (ref 32.0–36.0)
MCV: 96.4 um3 — ABNORMAL HIGH (ref 79.0–95.0)
MPV: 9.8 fL (ref 9.4–12.4)
Monocytes: 7 % (ref 5–12)
Plt Count: 80 10*3/uL — ABNORMAL LOW (ref 140–370)
RBC: 2.53 10*6/uL — ABNORMAL LOW (ref 3.90–5.20)
RDW: 19.4 % — ABNORMAL HIGH (ref 12.0–14.0)
Segs: 88 % — ABNORMAL HIGH (ref 34–71)
WBC: 9.9 10*3/uL (ref 4.0–10.0)

## 2014-10-06 LAB — BLOOD CULTURE: BLOOD CULTURE RESULT: NO GROWTH

## 2014-10-06 LAB — PROTHROMBIN TIME, BLOOD
INR: 2.2
PT,Patient: 24.4 s — ABNORMAL HIGH (ref 9.7–12.5)

## 2014-10-06 LAB — GLUCOSE (POCT)
Glucose (POCT): 199 mg/dL — ABNORMAL HIGH (ref 70–115)
Glucose (POCT): 261 mg/dL — ABNORMAL HIGH (ref 70–115)
Glucose (POCT): 259 mg/dL — ABNORMAL HIGH (ref 70–115)
Glucose (POCT): 237 mg/dL — ABNORMAL HIGH (ref 70–115)

## 2014-10-06 LAB — TOTAL PROTEIN, OTHER: Total Protein, Other: 2.2 g/dL

## 2014-10-06 LAB — LDH, OTHER: LDH, Other: 112 IU/L

## 2014-10-06 LAB — MAGNESIUM, BLOOD: Magnesium: 2.5 mg/dL (ref 1.7–2.6)

## 2014-10-06 LAB — PHOSPHORUS, BLOOD: Phosphorous: 3 mg/dL (ref 2.7–4.5)

## 2014-10-06 LAB — TYPE & SCREEN
ABO/RH: O POS
Antibody Screen: NEGATIVE

## 2014-10-06 LAB — VANCOMYCIN, RANDOM: Vancomycin, Random: 20.9 ug/mL

## 2014-10-06 SURGERY — THORACENTESIS

## 2014-10-06 SURGERY — IR THORACENTESIS

## 2014-10-06 MED ORDER — POTASSIUM PHOSPHATE 10 MEQ/100 ML D5W
10.00 meq | Status: AC
Start: 2014-10-06 — End: 2014-10-06
  Administered 2014-10-06 (×2): 10 meq via INTRAVENOUS
  Filled 2014-10-06 (×2): qty 100

## 2014-10-06 MED ORDER — SODIUM CHLORIDE 0.9 % IV SOLN
Freq: Once | INTRAVENOUS | Status: AC | PRN
Start: 2014-10-06 — End: 2014-10-07

## 2014-10-06 MED ORDER — LIDOCAINE, BUFFERED 1% IJ SOLN (COMPOUNDED)
INTRADERMAL | Status: DC | PRN
Start: 2014-10-06 — End: 2014-10-06
  Administered 2014-10-06: 5 mL via INTRADERMAL

## 2014-10-06 MED ORDER — FUROSEMIDE 10 MG/ML IJ SOLN
40.00 mg | Freq: Once | INTRAMUSCULAR | Status: AC
Start: 2014-10-06 — End: 2014-10-06
  Administered 2014-10-06: 40 mg via INTRAVENOUS
  Filled 2014-10-06: qty 4

## 2014-10-06 MED ORDER — VANCOMYCIN HCL 1000 MG IV SOLR
600.0000 mg | Freq: Two times a day (BID) | INTRAVENOUS | Status: DC
Start: 2014-10-06 — End: 2014-10-08
  Administered 2014-10-06 – 2014-10-08 (×4): 600 mg via INTRAVENOUS
  Filled 2014-10-06 (×4): qty 600

## 2014-10-06 SURGICAL SUPPLY — 3 items
CATHETER CENTESIS 1-STEP 4FR 7CM (Lines/Drains) IMPLANT
PREP CHLOROPREP 10.5ML 1-STEP (Misc Medical Supply)
TRAY THORACENTESIS (Kits/Sets/Trays) ×2 IMPLANT

## 2014-10-06 NOTE — Interdisciplinary (Signed)
Pt presents with worsening right pleural effusion.  Scheduled for thoracentesis today.  C/o difficulty breathing, declining OOB activity.  RN aware of pt refusal and complaints.  Recently performed ther ex in bed with OT, but stating she has too much trouble breathing with bed ther ex.  Will defer P.T until after thoracentesis.

## 2014-10-06 NOTE — Progress Notes (Signed)
Inpatient Medicine Progress Note       Interval Events/Subjective:   Pt with increased dyspnea overnight, receivied lasix 40 mg iv x1. Awaiting thoracentesis today. No n/v/f/c.    Vitals signs:     Latest Entry  Range (last 24 hours)    Temperature: 98.4 F (36.9 C)  Temp  Avg: 98.4 F (36.9 C)  Min: 98.2 F (36.8 C)  Max: 98.6 F (37 C)    Blood pressure (BP): (!) 118/32 mmHg  BP  Min: 107/48  Max: 131/54    Heart Rate: 86  Pulse  Avg: 86.4  Min: 80  Max: 98    Respirations: 16  Resp  Avg: 16.8  Min: 12  Max: 25    SpO2: 96 %  SpO2  Avg: 96.9 %  Min: 90 %  Max: 100 %       No Data Recorded     Yesterday's intake and output:  02/25 0600 - 02/26 0559  In: 1478 [P.O.:1177; I.V.:150]  Out: -     Physical Exam  GENERAL:  Thin appearing woman in NAD.  Pleasant and cooperative.     HEENT: EOMI. Oropharynx appears moist. No LAD.   CARDIOVASCULAR:  Tachycardic and irregular rhythm.   LUNGS: CTA on L, absent bs on R  ABDOMEN: Soft, non-distended. Normoactive bowel sounds. Non-tender  EXTREMITIES: Warm and well perfused.   No asterixis     Labs seen and reviewed.     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     # acute respiratory failure: 2/2 increased pleural effusion.  Awaiting repeat thoracentesis, will cont diuresis until then.  On vanc/zosyn for possible hcap/sbe.      #sepsis: suspect from UTI or infected pleural fluid (SBE) vs HCAP. Wbc rose on ctx but now improved on vanc/zosyn  - cont vanc zosyn  - f/u pleural fluid cultures; resend pleural fluid for analysis with repeat thoracentesis   - f/u urine culture -> growing candida, consider treating if worsens    # colonic dilation: less likely 2/2 cdiff as negative studies.  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  Clinically improving    #AMS: multifactorial, contribution of HE + infection with norovirus and now possibly UTI. Improving.   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 Abdominal ultrasound on admission shows patency of vessels.   --Hepatic encephalopathy: continue lactulose/rifaximin, Improving. defer downsizing of TIPS given that downsizing will likely lead to recurrent re accumulating HHT.   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   --Ascites: lasix 20/aldactone 50. S/p TIPS      --HCC: CT 06/2014 without lesions c/f HCC, repeat due in May.     #Afib: Initially in RVR but HR now better controlled on oral diltiazem  -continue diltiazem, change to ER once dose determined   -Previous team had extensive discussion with patient and family regarding risk/benefits of anticoagulation and family wanted to pursue [facility] with xarelto however will defer beginning ac at this time given  patient is at a high fall risk in setting of acute encephalopathy. Patient and family expressed agreement with this plan and discussion regarding ac will be revisited pending improvement in her encephalopathy.     #norovirus: diarrhea resolved.   --encourage oral intake, and also contact precautions    #DM2: not well controlled   --cont lantus to 35units and 12units lispro qac per endocrine recs  --add insulin coverage for glucerna supplements   --high intensity SSI     #Osteoporosis  --Continue home calcium and vitamin D    #GERD:   --Continue pepcid 20 mg daily    # moderate malnutrition: appreciate nutrition recs.     VTE PPx: SQ heparin   CODE : Full Code  DISPO: Pending clinical improvement.

## 2014-10-06 NOTE — Progress Notes (Signed)
Pharmacokinetics Note - Vancomycin    Indication: Pneumonia  Current Regimen: Vancomycin 500 mg Q 8 hr.  Treatment day #4.  Target Level: 15-20 mg/L    Current Levels & Pertinent Labs  IP Vancomycin Latest Ref Rng 09/12/2014 09/26/2014 10/05/2014 10/05/2014 10/04/2014   Vanco Random - - 20.9 - - -   GFR Non-AA - 57 56 56 >60 >60   sCr 0.51 - 0.95 mg/dL 0.96(H) 0.98(H) 0.98(H) 0.89 0.78   WBC 4.0 - 10.0 1000/mm3 - 9.9 - 10.3(H) 12.2(H)       Assessment/Plan:  - Based on these results trough slightly above goal so will decrease dose to 600 mg IV q 12h  - Ordered vancomycin Random to be drawn on 3/1  - Continue to monitor sCr daily.     Pharmacy will continue to follow and make adjustments/recommendations to therapy as needed.    Eunice Blase, PHARMD

## 2014-10-06 NOTE — Interdisciplinary (Signed)
Nutrition f/u note.   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 S/p TIPS (06/2014) with recent revision no 09/14/14. Colonic dilation/ileus: less likely 2/2 cdiff as negative studies/improving. 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 AMS improved. Transplant: Age >70, not a candidate.   S/p thora -1L 2/22.  To repeat today    F/u wt: 106 lbs today @ 101% IBW of 105 lbs, -11 lbs x 3 days which likely incorrect 2/2 bedscale errors.   Stable wt range since admit: 111-117 lbs. Given pt w/ESLD, fluids status and pt s/p thora, will base needs on lowest wt since admit on 2/18 @ 111 lbs(50.5 kg,106% IBW of 105 lbs. BMI: 21). Will continue to monitor trends.   Wt hx: see 2/16 note  Diet Rx: Ice chips, prior on pureed, carb limited diet started early pm today, prior on full liquid diet. Glucerna TID  GI: soft, NT, ND abd, NABS, N improved, edentulous, on SLP list, on pureed, thin liquids per eval 2/25.  +4bm 2/25/ none today so far.    PO intake: improving very slowly  since last f/u. 55% x 4 meals on full liquid x 3 days and Glucerna TID x 2 days when pt was not NPO/ sips of water/clears (~1200 kcal (84% of low end needs), 44 g protein  (73% of low end needs). Family was instructed to bring in blenderized soup from home to encourage po intake, to keep encouraging glucerna TID(no chocolate flavor). Pt dislikes yogurt, but likes applesauce and vanilla pudding. These two items will be provided each meal as pt does not like the pureed consistency foods.   I/O: 1652/5 BRPs ( adequacy deferred to MD). Net fluids since admit: +15.11 L  Labs: POCT-BS: 261, K up to wnl @ 3.6(eplaced), phos up to wnl  @ 3(replaced), corrected [place] @ 8.62, transaminases wnl, T bii @ 2.71 trends down. WB Zn @ 643.4(06/2014), Vit D(25-OH)_0 (06/2014), Vit A @ 0.22(06/2014)  Meds: cholecalciferol 400 units, pepcid, lasix 40 mg daily, lantus 35 units, lispro SSI, nutritional insulin 12 untis TID , ispro SSI, lactulose 20 BID, , MVT, zosyn, KCl/completed,  KPhos /completed, rifaximin, spironolactone, vanco, compazine prn, simethicone prn.   Skin: skin tear to Lt arm per RN head to toe assessment 2/26.   Edema: trace to RLE and LLE per RN head to toe assessment 2/26.   Nutrition-Focused Physical Exam 2/16: (visual) pt expressed being tired of being examined today, explained didn't need to move and we didn't need to touch her  Subcutaneous Fat Loss: Orbital Region: Severe Muscle Loss: Temporalis: moderate, Quadricepts: moderate, Patellar Region: moderate Posterior Calf Region: mild  NFPE2/23: Severe fat loss to orbital and thorasic region, moderate to severe fat loss to upper arm region. Severe muscle loss to temple area, clavicles are protruded, moderate to severe loss to acromion region, moderate loss to quadriceps, patellar region and gastrocnemius muscle. Poor nail pefusion.     Estimated needs per [place](50.5kg):952 kcal x 1.5-1.7= 1428-1618 kcal/day (28-32 kcal/kg). 61-76g protein (1.2-1.5g protein/kg) Maintenance Fluids /deferred to MD/ on diuretics    D: Severe malnutrition r/t chronic catabolic illness AEB pt w/ESLD, ~18.8% wt loss  x 1.5 years, <50% calorie intake x at least 2 wks(1 wk PTA) and per physical exam findings.     I: GOAL(2/16): Pt to meet >75% of estimated needs with acceptable tolerance(not met)  GOAL(2/23):Pt to meet >75% of estimated needs with acceptable tolerance by next f/u (calorie met (74%), not proteins (73%).   GOAL(2/26):Pt to meet >75% of estimated needs with acceptable tolerance by next f/u.      Plan/Recs:   1-Rec to continue monitoring lytes and replacing as needed.    2- Rec to ADAT to carb limited,  2g Na, pureed. Advance to higher consistency per SLP recs.   3-Rec to continue glucerna TID. Will provide high protein snacks to encourage more po intake  4- Highly rec to increase cholecalciferol to 5000 units daily, and to add minerals to MVT  5- Highly rec to check whole blood Cu and Se  6- Rec to continue daily wt    Nutrition POC(coordination of care): paged to MD [facility] [place]  Education: When/if appropriate  Discharge: pending clinical course     RD to monitor/evaluate PO intake/dequacy, GI condition, labs, wt trends, skin condition, and will f/u per policy  Rochele Pages, MS,RD pager # [phone]

## 2014-10-06 NOTE — Progress Notes (Signed)
ATTENDING PROGRESS NOTE ATTESTATION  Interval history:  RRT overnight for increased oxygen requirement. Got IV lasix. More comfortable this AM.   See fellow's history and physical for further details of the patient's history.    Objective  I have examined the patient and concur with the fellow's exam.  Labs were reviewed.  Wbc 10K today    Assessment and Plan  I agree with the fellow's care plan.  25F with HCV gt2 cirrhosis (SVR) c/b ascites and HHthor s/p TIPS 06/28/14, s/p TIPS revision 09/14/14, admitted for HE. Issues with noncompliance as well as norovirus infection. GIven her rapid improvement of hepatic encephalopathy with rifaximin and lactulose, will defer downsizing the transjugular intrahepatic portosystemic shunt.   -- infection: on v/z.  presumed UTI+?infected pleural fluid.   -- respiratory: SOB, cough: repeat CXR today with reacumulation of effusion, significantly worse than before. diuretics to 100 spiro/20 lasix yesterday with IV dose overnight. Needs urgent thora repeat with repeat cell count to look for improvement in cell count.    -- colonic ileus: in setting of infections. Improving.   -- afib: eval after acute issues resolve. would not start it if she continues to have episode of hepatic encephalopathy making her a high fall risk.     See the fellow's history and physical for further details.  [name] MD, PID [id number]

## 2014-10-06 NOTE — Plan of Care (Signed)
O2 titrated down to 3L high flow nasal cannula. O2 sat >96%. Pt denies sob.

## 2014-10-06 NOTE — Plan of Care (Signed)
RRT was called at around 2325 due to  sob even after breathing tx was given by RT [facility]. O2 increased to 7L from 3L. Pt uses accessory muscles in breathing and pt stated that she feels 'drowning'. Dr. Lyndel Safe was paged and came in to examine pt. STAT ABG, CXR, EKG done. Lasix 40 mg iv was administered as ordered. Family at the bedside. Pt is alert and verbally responsive. Will monitor closely.

## 2014-10-06 NOTE — Interdisciplinary (Addendum)
Case manager spoke with family members and confirmed that [facility] [phone] is their 1st choice of SNFs. Patient is having a  thoracentesis today and is requiring oxygen at 6 l/m this afternoon.   Caae manager will continue to follow for discharge planning.

## 2014-10-06 NOTE — Interdisciplinary (Signed)
At around 2313 on 10/05/14 Rapid Response was activated because the primary nurse had a sense that the patient was having trouble breathing via the family members. Patient was awake but lethergic  At the time of my arrival patient was lethergic but appeared comfortable breathing about 20 to 22 per minute with a stable blood pressure. Patient's breath sounds were with crackles bilaterally.    Patient is a 74 year old female with history of HCV complicated by requiring TIPS, DM, HTN and UTI who presented with AMS, nausea and  vomiting on 10/04/2014.    Patient' MD ordered lasix, CXR and ABG which were to be carried out on [address]. There was no immediate need to transfer the patient to another floor

## 2014-10-06 NOTE — Plan of Care (Signed)
Problem: Breathing Pattern - Ineffective  Goal: Respiratory rate, rhythm and depth return to baseline  No signs of respiratory distress at this time. Breathing is unlabored. Pt appears to be comfortable while sleeping. O2 sat at 98-100%. RR 14-16. Pt with good urine output. Diaper is soaked. Kept clean and dry.

## 2014-10-06 NOTE — Progress Notes (Signed)
Hepatology Progress Note  Consult Attending: Dr. Marykay Lex, MD    24h events:    - found sob last night, rapid response was called lasix 40 mg iv x1 was given. CXR with worsening right pleural effusion.      Past Medical History:  Past Medical History   Diagnosis Date    Migraine headache     Glaucoma     Ascites 06/29/2007     History of paracentesis x 2    Pancreatic cyst 06/29/2007    Cirrhosis ([place]) 06/24/2007     Active on the liver transplant waiting list with a blood type of O positive. 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 Due to Hep C    Osteopenia 06/24/2007    Emphysema 06/24/2007    EV (esophageal varices) ([place]) 06/10/2007    Anemia 06/10/2007    HCV (hepatitis C virus) 06/10/2007     Achieved SVR.    Type II or unspecified type diabetes mellitus with peripheral circulatory disorders, not stated as uncontrolled(250.70) 06/10/2007    HTN 06/10/2007    HTN 06/10/2007       Past Surgical History:   Past Surgical History   Procedure Laterality Date    Pb laps surg cholecstc w/expl common duct  1970s     in [place];  open CCX    Pb cesarean delivery only      Pb cstoplasty/cstourtp plstc any       Bladder suspension    Egd procedure  06/01/2013     Procedure: GI EGD;  Surgeon: Jeanette Caprice, MD;  Location: [facility];  Service: Gastroenterology;;    Tips procedure         Allergies:  No Known Allergies    Home Medications:  Prescriptions prior to admission   Medication Sig Dispense Refill Last Dose    alendronate (FOSAMAX) 70 MG tablet    09/22/2014    ALPHAGAN P 0.1 % ophthalmic solution    09/22/2014    bacitracin 500 UNIT/GM ointment Apply small amount to affected area on face twice daily 1 Tube 1 09/22/2014    CALCIUM CARBONATE 600 MG OR TABS 1 tablet 2 times daily one month 11 09/22/2014    ciprofloxacin (CIPRO) 500 MG tablet Take 1 tablet by mouth daily. 30 tablet 5 09/22/2014    furosemide (LASIX) 20 MG tablet Take 2 tablets by mouth daily. 90 tablet 0 09/22/2014     glipiZIDE (GLUCOTROL) 5 MG tablet Take 0.5 tablets (2.5 mg) by mouth 2 times daily. 30 tablet 1 09/22/2014    insulin glargine (LANTUS) 100 UNIT/ML injection Inject 20 Units under the skin daily. 10 mL 1 09/22/2014    lactulose 10 GM/15ML solution Take 30 mLs (20 g) by mouth 3 times daily. 946 mL 0 09/22/2014    LUMIGAN 0.01 %    09/22/2014    multivitamin (MULTI-VITAMIN) tablet Take 1 tablet by mouth daily.   09/22/2014    ranitidine (ZANTAC) 300 MG tablet Take 300 mg by mouth 2 times daily.   09/22/2014    rifaximin (XIFAXAN) 550 MG tablet Take 550 mg by mouth every 12 hours.   09/22/2014    sitagliptin (JANUVIA) 50 MG tablet Take 1 tablet (50 mg) by mouth daily. 60 tablet 1 09/22/2014    spironolactone (ALDACTONE) 50 MG tablet Take 1 tablet (50 mg) by mouth daily. 30 tablet 0 09/22/2014    VITAMIN D 400 UNIT OR CAPS 1 CAPSULE DAILY 30 11 09/22/2014  Inpatient Medications:  Scheduled Meds   albuterol  2.5 mg As Directed    cholecalciferol  400 Units Daily    diltiazem  45 mg Q6H    famotidine  20 mg Daily    furosemide  20 mg Daily    insulin glargine  35 Units Daily    insulin lispro  1-10 Units 4x Daily WC    insulin lispro  12 Units TID AC    insulin lispro  8 Units TID WC    lactulose  20 g BID    multivitamin  5 mL Daily    piperacillin-tazobactam (ZOSYN) IVPB  3,375 mg Q8H    potassium PHOSphate  10 mEq Q2H    rifaximin  550 mg Q12H    sodium chloride (PF)  3 mL Q8H    spironolactone  100 mg Daily    vancomycin (VANCOCIN) IVPB  500 mg Q8H     IV Meds   sodium chloride       Social History:   Lives with husband. Has 10 children. Was a homemaker.  Denies alcohol, drugs, tobacco.     Review of Systems: -   Constitutional: Denies fevers, chills, weight loss  ENT: negative for eye changes, oral lesions, difficulty swallowing  Cardiac: Denies chest pain or palpitations  Lungs: Denies cough or shortness of breath  GI: Negative except noted in the HPI  GU: Denies dysuria, hematuria  Skin:  Denies rashes or other skin lesions  MSK: Denies joint deformities, new joint pain  Lymph: Denies enlarged nodes, night sweats, abnormal bleeding  Psych: negative for depressed mood, anxiety  Neuro: negative for confusion, weakness, sensory changes    OBJECTIVE:   Vitals signs:   Latest Entry  Range (last 24 hours)    Temperature: 98.4 F (36.9 C)  Temp  Avg: 98.4 F (36.9 C)  Min: 98.2 F (36.8 C)  Max: 98.6 F (37 C)    Blood pressure (BP): (!) 118/32 mmHg  BP  Min: 107/48  Max: 131/54    Heart Rate: 86  Pulse  Avg: 86.4  Min: 80  Max: 98    Respirations: 16  Resp  Avg: 16.8  Min: 12  Max: 25    SpO2: 96 %  SpO2  Avg: 96.9 %  Min: 90 %  Max: 100 %       No Data Recorded     Weight - scale: (!) 48.1 kg (106 lb 0.7 oz)  Percentage Weight Change (%): -10.76 %    Intake/Output       10/05/14 0600 - 10/05/2014 0559 10/03/2014 0600 - 10/07/14 0559      2841-3244 1800-0559 Total 0600-1759 1800-0559 Total       Intake    P.O.  827  350 1177  --  -- --    I.V.  --  150 150  150  -- 150    Total Intake 920 495 4719 150 -- 150       Output    Total Output -- -- -- -- -- --       Net I/O     920 495 4719 150 -- 150        Respiratory support:  Oxygen Therapy  SpO2: 96 %  O2 Device: High Flow Nasal Cannula  O2 Flow Rate (L/min): 3 l/min     Physical Exam:  General:  Well developed, thin elderly Hispanic female in no apparent distress  HEENT:  No scleral icterus, Trachea midline,  mucus membranes moist.  Lungs:  Crackles bilaterally  Cardiovascular:  Irregularly irregular  Abdomen:  Abdomen less tense, non tender to palpation, bowel sound present. No peritoneal signs.  Extremities:  Trace peripheral edema. Warm well-perfused.  Skin:  Non-icteric and no visible rash  Neuro/Psych:  Cranial nerves II-XII grossly intact. Awake. No asterixis    Labs:     CBC  Recent Labs      10/04/14   0455  10/05/14   0458  09/17/2014   0617   WBC  12.2*  10.3*  9.9   HGB  8.8*  8.7*  8.6*   HCT  25.9*  25.2*  24.4*   PLT  79*  88*  80*   BAND  17*    --    --    SEG  78*  89*  88*   LYMPHS   --   3*  4*   MONOS  4*  8  7      Chemistry  Recent Labs      10/05/14   0458  10/05/14   2310  09/18/2014   0617   NA  137  133*  132*   K  3.4*  3.1*  3.2*   CL  107  103  101   BICARB  15*  15*  16*   BUN  28*  28*  27*   CREAT  0.89  0.98*  0.98*   GLU  132*  233*  204*   [place]  8.0*  7.9*  7.9*   MG  2.2   --   2.5   PHOS  2.6*   --   3.0     Recent Labs      10/05/14   0458  09/21/2014   0617   ALK  101  104   AST  22  20   ALT  17  15   TBILI  3.19*  2.71*   ALB  3.2*  3.1*        Coags  Recent Labs      10/05/14   0458  09/19/2014   0617   PT  26.1*  24.4*   INR  2.4  2.2       Radiology:   CT A/P w/ contrast 09/25/14: FINDINGS:  LUNG BASES: Small to moderate right pleural effusion with right lower lobe   atelectasis  LIVER/BILIARY:Diffuse nodularity and hepatic atrophy. Stable scattered tiny   low attenuation lesions in the liver.  PANCREAS: Stable numerous pancreatic cysts.  SPLEEN: Unremarkable.  ADRENAL GLANDS: Unremarkable  KIDNEYS: Stable left renal cyst.  STOMACH/DUODENUM: Mild wall thickening from the distal stomach to the proximal  jejunum  VASCULATURE: Patent TIPS. Stable nonocclusive thrombus of the main portal vein  and superior mesenteric vein. New thrombosis of the left portal vein.  LYMPHATIC: Unremarkable  SMALL & LARGE BOWEL: Unremarkable  BLADDER/PELVIC ORGANS: Unremarkable  BONES/SOFT TISSUES: Unremarkable  OTHER: Trace pelvic ascites    IMPRESSION:  1) New thrombosis of the left portal vein. Vessel was patent on prior   abdominal ultrasound 09/11/2014  2) Edema of the distal stomach through proximal jejunum suggestive of   enteritis, nonspecific    CT head 10/01/2014: IMPRESSION:  1. No evidence of acute intracranial hemorrhage or findings to suggest   territorial/transcortical infarct.  2. Mild white matter changes. These may be associated with prior small vessel   ischemia. If there is concern for encephalopathy, MRI would be more helpful  for acute  evaluation. MRI screening: No intracranial or intraorbital metal.  3. Frothy secretions within the right sphenoid sinuses well as reactive bone   changes, suggestive of acute on chronic sinusitis.    RUQ U/S 09/21/2014: FINDINGS:  The liver measures 11.3 cm in long axis. It is normal in echogenicity. No   focal lesions are identified. There is no intra or extra hepatic bile duct   dilation. The common bile duct measures four mm. The gallbladder is not   visualized. A patent TIPS catheter is present. The TIPS velocities are: Portal  end 115 ; mid 93 ; hepatic 125 cm/s. Flow in the right and left portal veins   is hepatofugal and towards the TIPS ; flow in the main portal vein is   hepatopetal.  The right kidney measures nine cm in long axis and is within normal limits   with no hydronephrosis or renal calculi.  The visualized portion of the pancreas again demonstrates multiple cystic   lesions throughout the pancreas, as seen on prior imaging  The visualized aorta and inferior vena cava are within normal limits.  Right pleural effusion. No evidence of intra abdominal ascites.    IMPRESSION:  Patent TIPS catheter with normal velocities and appropriate flow in portal   veins.  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    cxr 10/05/14    Moderate right pleural effusion, increased from radiograph 10/01/2014.   Associated right basal atelectasis.  Increased prominent of the right heart. Dilated azygos arch representing   increased intravascular volume.  No other change.      Prior Endoscopy:   EGD 05/2013:  Findings: 1. Midline orophyarngeal prominence at the  arytenoid  2. Three columnns of grade 1 esophageal varices, no high  risk features  3. No gastric varices, mild portal hypertensive gastropathy  4. Few punctate clots in the antrum  5. Mild duodenal congestion consistent with portal  hypertension  Endoscopic Diagnosis: 1. Midline  orophyarngeal  prominence at the arytenoid  2. Three columnns of grade 1 esophageal varices, no high  risk features  3. No gastric varices, mild portal hypertensive gastropathy  4. Few punctate clots in the antrum  5. 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 Continue propranolol 10 BID  2. ENT consultation to evaluate oropharyngeal findings  3. Repeat EGD in 1 year for varices screening        CT scan of the abdomen and pelvis without IV contrast 10/03/14    Compared to the prior CT abdomen pelvis of 09/25/2014, there is new gaseous   distention of the large bowel. No evidence of bowel obstruction, transition   point or pneumatosis intestinalis. There has been interval resolution of   proximal jejunal bowel wall thickening. No other significant changes compared   to the prior study.    Note that assessment of solid organs is limited in the absence of intravenous   Contrast.    CXR 10/05/14    IMPRESSION:  Moderate right pleural effusion, increased from radiograph 09/17/2014.   Associated right basal atelectasis.  Increased prominent of the right heart. Dilated azygos arch representing   increased intravascular volume.  No other change.    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 Confusion associated with poor medication compliance, including lactulose, N/V, abd pain, diarrhea, and f/c/s. HDS. Work-up here showed GI pathogen panel with norovirus and recent UA consistent with UTI.     #AMS: Resolved. Likely precipitated by infection from UTI and norovirus infection.   -deferring previous plans to downsize TIPS given several infectious precipitants and TIPS  downsizing will increase rate of pleural effusion accumulation.    # Sepsis/abdominal pain  Patient with no abdominal pain and bloating today. X ray found with colonic ileus, up 7.9 cm colon 09/21/2014. Improved leukocytosis. Thoracentesis with 1400 wbc/70% neutrophils.    - started vanco zosyn (02/23-), need to continue to complete 7 days for treating possible urinary or pulmonary source  - cont supportive care for norovirus  - dc flagyl IV for possible c diff infection  - ct non con:L gaseous distention, no transition point    #SOB  With increased pulmonary edema and right pleural effusion on the last CXR. SOB overnight, lasix was given.  - diurese with IV lasix  - thoracentesis 09/26/2014 with 1400 wbc and 70% neutrophils  - repeat thoracentesis today, check cell count, protein, ldh, cultures    #)Norovirus: likely responsible for initial N/V, abd pain and diarrhea on presentation  - Follow up blood cultures: NGTD  - Supportive care with IVF and anti-emetics as needed    #)Afib w rvr  S/p diltiazem, now rate controlled. CHADS score 2, chads vsc 4. High risk of fall due to her hepatic encephalopathy, highly concerned for the possibility of anticoagulation. We would hold anticoagulation at this time.  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 and TIPS revision 09/14/14 MELD 23    * HE: still encephalopathic but improved, likely related to infection and TIPS revision,    - Continue lactulose enemas, or po titrate for 3-4 soft bowel movements a day and rifaximin  * Ascites: lasix 20 mg po and spironolactone 100 mg po for increased sob and volume overload (extra iv lasix as needed)  * EV's: S/p TIPS, no longer needs surveillance if TIPS is patent and she is stable  * HHT: S/p TIPS and TIPS revision   - RUQ U/S 2/13 with patent TIPS  * Non-occlusive portal vein thrombosis: Multiple  outpatient notes indicate poor medication adherence/knowledge, remains fall risk as well.  Risks outweigh benefits  * [place] surveillance: Last CT on 06/27/2014 showed no HCC. Repeat imaging in May 2016.  * Transplant: Age >70, not a candidate    DWA Dr. Lilly Cove, MD  [facility]

## 2014-10-06 NOTE — Consults (Signed)
Reason for consult: R pleural effusion  Consulting Provider: Dr. Bunnie Pion    History of Present Illness:  Paula Manning is a 74 year old female who was admitted to to [facility] on 10/08/2014 with hepatic encephalopathy. 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 Pulm was consulted for thoracentesis.     Review of Systems: Review of systems is negative on a 10-point ROS scale except as mentioned in the HPI.     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   HEENT: PERRL, No JVD.   CV: Normal S1S2, No M/R/Gs.  Pulm: No breath sounds on the right, normal entry with scattered wheezes on the right.   Extr: No edema. No rashes.     Data: Labs/Imaging reviewed.  INR 2.2  CXR shows large R hydrothorax w/o mediastinal shift.     Assessment and Recommendations: Paula Manning is a 74 year old female with H/o HCV Cirrhosis c/b Hepatic encephalopathy s/p TIPSS placement and Hepatic hydrothorax s/p multiple IR guided thoracenteses last on 2/22. Patient is also being diuresed by the primary team, but developed worsening pleural effusion nevertheless. Plan was for thoracentesis this afternoon after coagulopathy was reversed by FFP. However, it seems like patient is going to undergo another IR-guided thoracentesis today. Please let us know if we can assist with anything in the future.     The above recommendations were discussed with Dr.Damieon Armendariz.   Thank you for the consult! Please call with any  questions.    Dianna Limbo, MD  Pulmonary Critical Care Fellow  Pgr: [phone]      ATTENDING NOTE: Patient seen and examined with Pulmonary Fellow.  History, examination, laboratory data, and pertinent radiology reviewed.  I agree with the assessment and plan as outlined above.  73 y/o female with worsening dyspnea secondary to hepatic hydrothorax.  Needs better diuresis.  If not effective, TIPS may be in order.

## 2014-10-06 NOTE — Plan of Care (Signed)
Problem: Tissue Perfusion, Cardiopulmonary - Altered  Goal: Hemodynamic stability  Outcome: Met  Pt is hemodynamically stable at this time. Afib on the 80s. SBP at 100s-110s.Diltiazem 45 mg po given as ordered.  Problem: Infection  Goal: Absence of infection signs and symptoms  Outcome: Met  The patient is afebrile, vitals are stable.Pt is on antibiotic for infection. Will continue to monitor.  Problem: Pain - Acute  Goal: Control of acute pain  Outcome: Met  Tramadol 50 mg po given for headache with adequate relief noted. Pt is sleeping at this time. Will reassess pain level and document result. Patient was encouraged to report increased levels of pain to the nurse. The patient verbalized understanding to instructions and teaching. No complaints of pain noted at this time. Will medicate patient for pain per MD order. Will monitor pain levels.  Problem: Skin Integrity- Intact patient high risk for impaired skin integrity Braden scale less than or equal to 18  Goal: Skin remains intact  Outcome: Met  The patient tolerates being repositioned every two hours. Mepilex applied to pt sacrum. Protective barrier used as needed. Skin is kept clean and dry. No evidence of new skin breakdown. Pt is on air mattress.  Problem: Falls, Risk of  Goal: Keep patient free from falls utilizing universal fall precautions  Outcome: Met  Pt hasn't fallen. Assessment complete. Pt at high risk for falls. Pt is AOx4. Bed is the lowest position and brakes are locked into place. Bed alarm on and call light within reach. Pt encouraged to used call light. Will monitor the pt closely. Pt works with PT and OT and walks with FWW.   Problem: Aspiration, Risk of  Goal: Evidence of swallowing difficulties is absent  Outcome: Met  Pt is now NPO except meds. Patient and family made aware. Pt should be in upright position when taking meds. RN educated patient and family on risk for aspiration. The patient and family verbalized understanding.  Problem:  Discharge Planning  Goal: Participation in care planning  Outcome: Met  Pt is not a candidate for discharge at this time. Family made aware of pt's status and plan of care.

## 2014-10-06 NOTE — Interdisciplinary (Signed)
OT attempted to see pt this morning.  Pt deferred therapy 2/2 fatigue.  Therapy will re-attempt this afternoon time and staffing permitted.  Thank you.

## 2014-10-06 NOTE — Plan of Care (Addendum)
UPDATE: Waiting for IR to perform thoracentesis on R effusion. Pt continues to have difficulty breathing with any amount of activity. Pt went for thoracentesis at about 1600. They took off a liter of fluid. Pt states she feels much better now. bandaid to R flank from puncture site. Pt did convert back into afib in the 80-90's.     Problem: Discharge Planning  Goal: Participation in care planning  Pt and family fully participative in care/ discharge planning.     Problem: Falls, Risk of  Goal: Keep patient free from falls utilizing universal fall precautions  Pt and family call appropriately for assistance.     Problem: Tissue Perfusion, Cardiopulmonary - Altered  Goal: Hemodynamic stability  VSS. Remains afebrile.     Problem: Pain - Acute  Goal: Communication of presence of pain  Pt has prn medication available for abdominal pain.     Problem: Alteration in Blood Glucose  Goal: Glucose level within specified parameters  Q6 blood glucose monitoring. Sliding scale and scheduled insulin in place.     Problem: Skin Integrity- Intact patient high risk for impaired skin integrity Braden scale less than or equal to 18  Goal: Skin remains intact  Dressing to L forearm.    Problem: Breathing Pattern - Ineffective  Goal: Respiratory rate, rhythm and depth return to baseline  Continues to cough spontaneously. Coughing up this white sputum.

## 2014-10-06 NOTE — Progress Notes (Signed)
Endocrine/Diabetes Progress Note    Overnight:  Rapid response overnight for worsened SOB with increased oxygen req.    Subjective: pt resting comfortably; family at bedside    Objective:   Temperature:  [98.2 F (36.8 C)-98.5 F (36.9 C)] 98.4 F (36.9 C) (02/26 0800)  Blood pressure (BP): (107-130)/(31-93) 118/32 mmHg (02/26 1000)  Heart Rate:  [80-89] 88 (02/26 1130)  Respirations:  [12-25] 16 (02/26 1130)  Pain Score:  [-] Patient Sleeping, Respiratory Assessment Done (02/26 1000)  O2 Device:  [-] Nasal cannula (02/26 1259)  O2 Flow Rate (L/min):  [2 l/min-7 l/min] 3 l/min (02/26 1259)  SpO2:  [91 %-100 %] 93 % (02/26 1259)  Body mass index is 20.05 kg/(m^2).    GEN: NAD, sleepy, chronically ill appearing  PULM: breathing comfortably this am on O2    Diet: NPO    Blood Sugars reviewed:        AM  Lu  Di  HS  O/N    02.21  326 371 378 248  Lantus   35  Lispro  0+6  0+6+10  0+3+10   +6    02.22  192 273 220 197  Lantus   35  Lispro  0+6+4 +10 +5    2.23  105 159 149 160  Lantus   35  Lispro   +3    2.24  167 188 192 184  Lantus   35  Lispro  8+3 8+4 8+4 12    02.25  139 224 193 240  Lantus  Lispro  12+8 +5 +4 +5    02.26  199  Lantus  Lispro  +4     A/P:  Paula Manning is a 74 year old female with uncontrolled DM2 c/b neuropathy as well as HepC cirrhosis, HTN, glaucoma and migraines admitted 09/18/2014 w/ n/v and AMS.     BGs in high 100s to low 200s, trending up in setting of poor po intake, likely due to underlying infection. Worsened SOB overnight. Pt currently NPO; Lispro doses q ac meals and for Glucerna supplement being held until pt resumes po intake and diet advances. Will continue to monitor.    Inpatient Recs:  - continue lantus  35 units qday  - resume Lispro 12 units qac meals once diet advances (RN to give 0, 1/2, whole dose based on % tray consumed)  - resume Lispro 8 units per can of Glucerna once diet advances (RN to give 0, 1/2, whole dose based on % tray consumed)  - continue high Lispro  correction scale q ac/hs    Outpatient Recs:  Was on Lantus 20 units qhs, Januvia 50 mg qday and glipizide 2.5 mg bid w/ A1C 6.2%, but home BG 200-400s in setting of OJ-carrot juice and ++rice/beans intake. Final doses TBD based on inpatient needs.    will cont to follow

## 2014-10-06 NOTE — Interdisciplinary (Signed)
OT Daily Treatment Note    Subjective:  RLE: No Precautions  LLE: No Precautions  RUE: No Precautions  LUE: No Precautions  Spine orthotic: None  Additional  Precautions/Contrandications:: Fall Risk    Pain Score (0-10): 0  Pain Location: No c/o pain    Status: Agreeable to treatment;RN ok'd treatment    Treatment Today  ADL Training [97535]: x1  Therex [97110]: x1              Objective:  Objective Findings: Pt received in supine, family present, agreeable to bed level therapy despite max encouragement.  (Pt scheduled for paracentesis this PM).  Pt participated in [place] yellow-theraband exercises in x3 planes, x5 reps each.  Pt completed x5 cross-crawls, x5 fist punches requiring extra time.  Pt educated on completing BUE HEP 3x's daily, x5 reps each.  Pt washed face with SBA.  Pt educated on PLB to improve sats,  able to maintain sats in 90's with frequent rests.  Pt left with family at bedside and RD present.  Nursing updated.    Assessment:  Assessment: Pt remains limited by fatigue and SOB, Pt required frequent rest breaks 2/2 dec O2 sats ~92%.  Pt required v/c's to continue practising PLB throughout [place] strengthening activities, able to maintain sats in 90's%.  Pt continues to benefit from skilled IP OT to Max I in ADL's, activity tolerance and safety.   Equipment Recommendations: to be determined as patient progresses with therapy    Plan:  Continue therapy for Diagnosis: Difficulty in walking (dec activity tolerance ) 719.7  Frequency: 4-7 times/wk  Focus for Next Treatment: Graded tasks to increase function;Patient exercise program instruction;Functional mobility training for ADLs;Safety instruction;ADL training     Total Treatment Time (min): 30  Total Timed Treatment (min) : 30

## 2014-10-07 LAB — COMPREHENSIVE METABOLIC PANEL, BLOOD
ALT (SGPT): 17 U/L (ref 0–33)
AST (SGOT): 23 U/L (ref 0–32)
Albumin: 3.4 g/dL — ABNORMAL LOW (ref 3.5–5.2)
Alkaline Phos: 111 U/L (ref 35–140)
Anion Gap: 15 mmol/L (ref 7–15)
BUN: 23 mg/dL — ABNORMAL HIGH (ref 6–20)
Bicarbonate: 16 mmol/L — ABNORMAL LOW (ref 22–29)
Bilirubin, Tot: 2.39 mg/dL — ABNORMAL HIGH (ref <1.20)
Calcium: 8.2 mg/dL — ABNORMAL LOW (ref 8.5–10.6)
Chloride: 107 mmol/L (ref 98–107)
Creatinine: 0.97 mg/dL — ABNORMAL HIGH (ref 0.51–0.95)
GFR: 56 mL/min
Glucose: 150 mg/dL — ABNORMAL HIGH (ref 70–115)
Potassium: 3.3 mmol/L — ABNORMAL LOW (ref 3.5–5.1)
Sodium: 138 mmol/L (ref 136–145)
Total Protein: 6 g/dL (ref 6.0–8.0)

## 2014-10-07 LAB — CBC WITH DIFF, BLOOD
ANC-Manual Mode: 8.9 10*3/uL — ABNORMAL HIGH (ref 1.6–7.0)
Abs Lymphs: 0.2 10*3/uL — ABNORMAL LOW (ref 0.8–3.1)
Abs Monos: 0.5 10*3/uL (ref 0.2–0.8)
Hct: 23.9 % — ABNORMAL LOW (ref 34.0–45.0)
Hgb: 8.3 gm/dL — ABNORMAL LOW (ref 11.2–15.7)
Lymphocytes: 2 % — ABNORMAL LOW (ref 19–53)
MCH: 33.9 pg — ABNORMAL HIGH (ref 26.0–32.0)
MCHC: 34.7 % (ref 32.0–36.0)
MCV: 97.6 um3 — ABNORMAL HIGH (ref 79.0–95.0)
MPV: 10.5 fL (ref 9.4–12.4)
Metamyelocytes: 1 %
Monocytes: 5 % (ref 5–12)
Number of Cells Counted: 115
Plt Count: 80 10*3/uL — ABNORMAL LOW (ref 140–370)
Plt Est: DECREASED
RBC: 2.45 10*6/uL — ABNORMAL LOW (ref 3.90–5.20)
RDW: 20.1 % — ABNORMAL HIGH (ref 12.0–14.0)
Segs: 92 % — ABNORMAL HIGH (ref 34–71)
WBC: 9.7 10*3/uL (ref 4.0–10.0)

## 2014-10-07 LAB — STERILE FLUID CULTURE W/GRAM STAIN, AEROBIC: Fluid Culture Result: NO GROWTH

## 2014-10-07 LAB — PROTHROMBIN TIME, BLOOD
INR: 1.9
PT,Patient: 20.9 s — ABNORMAL HIGH (ref 9.7–12.5)

## 2014-10-07 LAB — GLUCOSE (POCT)
Glucose (POCT): 142 mg/dL — ABNORMAL HIGH (ref 70–115)
Glucose (POCT): 203 mg/dL — ABNORMAL HIGH (ref 70–115)
Glucose (POCT): 218 mg/dL — ABNORMAL HIGH (ref 70–115)
Glucose (POCT): 204 mg/dL — ABNORMAL HIGH (ref 70–115)

## 2014-10-07 LAB — URINE ELECTROLYTES
Chloride, Urine: <20 mmol/L
Potassium, Urine: 22 mmol/L
Sodium, Urine: <20 mmol/L

## 2014-10-07 LAB — MAGNESIUM, BLOOD: Magnesium: 2.2 mg/dL (ref 1.7–2.6)

## 2014-10-07 LAB — PREPARE FFP/THAWED PLASMA: Fresh Frozen Plasma: TRANSFUSED

## 2014-10-07 LAB — FLUID CULTURE IN BLOOD BOTTLES: Fluid Culture Result: NO GROWTH

## 2014-10-07 LAB — PHOSPHORUS, BLOOD: Phosphorous: 3 mg/dL (ref 2.7–4.5)

## 2014-10-07 LAB — BLOOD CULTURE: Blood Culture Result: NO GROWTH

## 2014-10-07 MED ORDER — POTASSIUM CHLORIDE CRYS CR 10 MEQ OR TBCR
20.00 meq | EXTENDED_RELEASE_TABLET | Freq: Once | ORAL | Status: AC
Start: 2014-10-07 — End: 2014-10-07
  Administered 2014-10-07: 20 meq via ORAL
  Filled 2014-10-07: qty 2

## 2014-10-07 MED ORDER — POTASSIUM CHLORIDE 10 MEQ/100ML IV SOLN
10.0000 meq | INTRAVENOUS | Status: DC
Start: 2014-10-07 — End: 2014-10-07

## 2014-10-07 NOTE — Plan of Care (Signed)
Report given to Arnel RN. Endorsed insulin to be given with meals since patient refused to eat earlier. Pt without distress. Family at bedside ,assisting with dinner.

## 2014-10-07 NOTE — Interdisciplinary (Signed)
1st to cal text paged for 3.3 Potassium level, Awaiting response.

## 2014-10-07 NOTE — Plan of Care (Signed)
Problem: Falls, Risk of  Goal: Keep patient free from falls utilizing universal fall precautions  High fall precaution observed. Hourly rounds in progress to meet patient needs. No falls.    Problem: Tissue Perfusion, Cardiopulmonary - Altered  Goal: Hemodynamic stability  Continue a-fib but well control on cardizem PO. No episodes of hypo or hypertension noted. Pt states she is feeling much better.    Problem: Pain - Acute  Goal: Communication of presence of pain  Assessed q 2 hrs with VS and also on activity- changing position or toilet. Pt denies pain.    Problem: Alteration in Blood Glucose  Goal: Glucose level within specified parameters  Monitored as ordered and insulin administered as ordered. FSG elevated before lunch 203.    Problem: Infection  Goal: Absence of infection signs and symptoms  Continue abx. Labs monitored. No fever noted.

## 2014-10-07 NOTE — Plan of Care (Signed)
Problem: Discharge Planning  Goal: Participation in care planning  Outcome: Not Met  Patient unable to work with PT d/t weakness. No orders for discharge. Continue medical management    Problem: Falls, Risk of  Goal: Keep patient free from falls utilizing universal fall precautions  Outcome: Met  Fall plan implemented. Bed in low position, bed rails x 2, bed alarm on, call light within reach, non skid  Socks on. Patient verbalizes understanding of call light use. Family at bedside    Problem: Tissue Perfusion, Cardiopulmonary - Altered  Goal: Hemodynamic stability  Outcome: Met  Afib/ NSR, Continue antiarrhythmic per eMAR. Denies chest pain or SOB    Problem: Pain - Acute  Goal: Communication of presence of pain  Outcome: Met  Patient able verbalize pain to staff. Denies pain at this time.         Problem: Alteration in Blood Glucose  Goal: Glucose level within specified parameters  Outcome: Met  Continue [place] blood glucose checks.     Problem: Infection  Goal: Absence of infection signs and symptoms  Afebrile, WBC 9.9. Continue Antibiotic as ordered.      Problem: Skin Integrity- Intact patient high risk for impaired skin integrity Braden scale less than or equal to 18  Goal: Skin remains intact  Outcome: Met  Kept clean and dry. Repositioned Q2hrs.     Problem: Aspiration, Risk of  Goal: Evidence of swallowing difficulties is absent  Outcome: Met  Sips and chips, 1:1 feeder. Continue to monitor.

## 2014-10-07 NOTE — Progress Notes (Signed)
ATTENDING PROGRESS NOTE ATTESTATION  Interval history:  S/p 1L thora  Down to 2L NC  See fellow's history and physical for further details of the patient's history.    Objective  I have examined the patient and concur with the fellow's exam.  Labs were reviewed.  thora wbc count decreased    Assessment and Plan  I agree with the fellow's care plan.  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 Issues with noncompliance as well as norovirus infection. GIven her rapid improvement of hepatic encephalopathy with rifaximin and lactulose, will defer downsizing the transjugular intrahepatic portosystemic shunt.   -- infection: on v/z.  presumed UTI+?infected pleural fluid. Plan for empiric course.   -- respiratory: s/p 1L thora yesterday. diuretics 100 spiro/20 lasix. Repeat CXR. Check urine lytes. Weight now back to dry weight (usually around 111) but still volume up on exam. Try nebs.   -- colonic ileus: in setting of infections. Improving.   -- afib: eval after acute issues resolve. would not start it if she continues to have episode of hepatic encephalopathy making her a high fall risk.     See the fellow's history and physical for further details.  [name] MD, PID [id number]

## 2014-10-07 NOTE — Progress Notes (Addendum)
Inpatient Medicine Progress Note       Interval Events/Subjective:   Pt w/increased dyspnea and hypoxia yesterday.  Received lasix 40 mg iv x2, thoracentesis in IR with 1L fluid removed. O2 requirements decreased today from 6L to 2L NC.  Pt says her breathing is "the same" but her family says she appears more comfortable.  Still with LE and UE edema.  No abd pain, n/v.    Vitals signs:     Latest Entry  Range (last 24 hours)    Temperature: 97.6 F (36.4 C)  Temp  Avg: 98 F (36.7 C)  Min: 97.3 F (36.3 C)  Max: 98.4 F (36.9 C)    Blood pressure (BP): (!) 122/37 mmHg  BP  Min: 105/40  Max: 139/49    Heart Rate: 84  Pulse  Avg: 89.1  Min: 80  Max: 101    Respirations: 14  Resp  Avg: 17.4  Min: 12  Max: 27    SpO2: 100 %  SpO2  Avg: 96.3 %  Min: 91 %  Max: 100 %       No Data Recorded     Yesterday's intake and output:  02/26 0600 - 02/27 0559  In: 1410 [P.O.:440; I.V.:400]  Out: 1000     Physical Exam  GENERAL:  Thin appearing woman in NAD.  Pleasant and cooperative.     HEENT: EOMI. Oropharynx appears moist. No LAD.   CARDIOVASCULAR:  Tachycardic and irregular rhythm.   LUNGS: CTA on L, absent bs on R, +wheezing  ABDOMEN: Soft, non-distended. Normoactive bowel sounds. Non-tender  EXTREMITIES: Warm and well perfused.   No asterixis     Labs seen and reviewed.     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     # acute respiratory failure: 2/2 increased pleural effusion.  Improving with diuresis and thoracentesis 2/26, cont diuretics today.  On vanc/zosyn for possible hcap/sbe.  Repeat cxr today    #sepsis: suspect from UTI or infected pleural fluid (SBE) vs HCAP. Wbc rose on ctx but now improved on vanc/zosyn  - cont vanc zosyn (plan for 7d course)  - f/u pleural fluid  cultures; resend pleural fluid for analysis with repeat thoracentesis -> fluid transudative, improved wbc  - f/u urine culture -> growing candida, consider treating if worsens    # colonic dilation: less likely 2/2 cdiff as negative studies.  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 Clinically improving    #AMS: multifactorial, contribution of HE + infection with norovirus and now possibly UTI. Improving.   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 Abdominal ultrasound on admission shows patency of vessels.   --Hepatic encephalopathy: continue lactulose/rifaximin, Improving. defer downsizing of TIPS given that downsizing will likely lead to recurrent re accumulating HHT.   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   --Ascites: lasix 20/aldactone 100. S/p TIPS. Check urine lytes, consider adjusting   --HCC: CT 06/2014 without lesions c/f HCC, repeat due in May.     #  Afib: Initially in RVR but HR now better controlled on oral diltiazem  -continue diltiazem, change to ER once dose determined   -Previous team had extensive discussion with patient and family regarding risk/benefits of anticoagulation and family wanted to pursue [facility] with xarelto however will defer beginning ac at this time given patient is at a high fall risk in setting of acute encephalopathy. Patient and family expressed agreement with this plan and discussion regarding ac will be revisited pending improvement in her encephalopathy.     #norovirus: diarrhea resolved.   --encourage oral intake, and also contact precautions    #DM2: not well controlled   --cont lantus to 35units and 12units lispro qac per endocrine recs  --add insulin coverage for glucerna supplements   --high intensity SSI     #Osteoporosis  --Continue home calcium and  vitamin D    #GERD:   --Continue pepcid 20 mg daily    # moderate malnutrition: appreciate nutrition recs.     VTE PPx: SQ heparin   CODE : Full Code  DISPO: Pending clinical improvement.

## 2014-10-07 NOTE — Progress Notes (Signed)
Hepatology Progress Note  Consult Attending: Dr. Marykay Lex, MD    24h events:    - s/p 1L thora yesterday w improved PMN count, cultures pending      Past Medical History:  Past Medical History   Diagnosis Date    Migraine headache     Glaucoma     Ascites 06/29/2007     History of paracentesis x 2    Pancreatic cyst 06/29/2007    Cirrhosis ([place]) 06/24/2007     Active on the liver transplant waiting list with a blood type of O positive. 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 Due to Hep C    Osteopenia 06/24/2007    Emphysema 06/24/2007    EV (esophageal varices) ([place]) 06/10/2007    Anemia 06/10/2007    HCV (hepatitis C virus) 06/10/2007     Achieved SVR.    Type II or unspecified type diabetes mellitus with peripheral circulatory disorders, not stated as uncontrolled(250.70) 06/10/2007    HTN 06/10/2007    HTN 06/10/2007       Past Surgical History:   Past Surgical History   Procedure Laterality Date    Pb laps surg cholecstc w/expl common duct  1970s     in [place];  open CCX    Pb cesarean delivery only      Pb cstoplasty/cstourtp plstc any       Bladder suspension    Egd procedure  06/01/2013     Procedure: GI EGD;  Surgeon: Jeanette Caprice, MD;  Location: [facility];  Service: Gastroenterology;;    Tips procedure         Allergies:  No Known Allergies    Home Medications:  Prescriptions prior to admission   Medication Sig Dispense Refill Last Dose    alendronate (FOSAMAX) 70 MG tablet    09/22/2014    ALPHAGAN P 0.1 % ophthalmic solution    09/22/2014    bacitracin 500 UNIT/GM ointment Apply small amount to affected area on face twice daily 1 Tube 1 09/22/2014    CALCIUM CARBONATE 600 MG OR TABS 1 tablet 2 times daily one month 11 09/22/2014    ciprofloxacin (CIPRO) 500 MG tablet Take 1 tablet by mouth daily. 30 tablet 5 09/22/2014    furosemide (LASIX) 20 MG tablet Take 2 tablets by mouth daily. 90 tablet 0 09/22/2014    glipiZIDE (GLUCOTROL) 5 MG tablet Take 0.5 tablets (2.5 mg) by  mouth 2 times daily. 30 tablet 1 09/22/2014    insulin glargine (LANTUS) 100 UNIT/ML injection Inject 20 Units under the skin daily. 10 mL 1 09/22/2014    lactulose 10 GM/15ML solution Take 30 mLs (20 g) by mouth 3 times daily. 946 mL 0 09/22/2014    LUMIGAN 0.01 %    09/22/2014    multivitamin (MULTI-VITAMIN) tablet Take 1 tablet by mouth daily.   09/22/2014    ranitidine (ZANTAC) 300 MG tablet Take 300 mg by mouth 2 times daily.   09/22/2014    rifaximin (XIFAXAN) 550 MG tablet Take 550 mg by mouth every 12 hours.   09/22/2014    sitagliptin (JANUVIA) 50 MG tablet Take 1 tablet (50 mg) by mouth daily. 60 tablet 1 09/22/2014    spironolactone (ALDACTONE) 50 MG tablet Take 1 tablet (50 mg) by mouth daily. 30 tablet 0 09/22/2014    VITAMIN D 400 UNIT OR CAPS 1 CAPSULE DAILY 30 11 09/22/2014       Inpatient Medications:  Scheduled Meds   cholecalciferol  400  Units Daily    diltiazem  45 mg Q6H    famotidine  20 mg Daily    furosemide  20 mg Daily    insulin glargine  35 Units Daily    insulin lispro  1-10 Units 4x Daily WC    insulin lispro  12 Units TID AC    insulin lispro  8 Units TID WC    lactulose  20 g BID    multivitamin  5 mL Daily    piperacillin-tazobactam (ZOSYN) IVPB  3,375 mg Q8H    rifaximin  550 mg Q12H    sodium chloride (PF)  3 mL Q8H    spironolactone  100 mg Daily    vancomycin (VANCOCIN) IVPB  600 mg Q12H     IV Meds   sodium chloride       OBJECTIVE:   Vitals signs:   Latest Entry  Range (last 24 hours)    Temperature: 98.2 F (36.8 C)  Temp  Avg: 98 F (36.7 C)  Min: 97.3 F (36.3 C)  Max: 98.4 F (36.9 C)    Blood pressure (BP): 133/48 mmHg  BP  Min: 105/40  Max: 139/49    Heart Rate: 90  Pulse  Avg: 90.2  Min: 84  Max: 101    Respirations: 18  Resp  Avg: 18.1  Min: 14  Max: 27    SpO2: 100 %  SpO2  Avg: 96.3 %  Min: 91 %  Max: 100 %       No Data Recorded     Weight - scale: 49.7 kg (109 lb 9.1 oz)  Percentage Weight Change (%): 3.33 %    Intake/Output       10/04/2014 0600 -  10/07/14 0559 10/07/14 0600 - 10/08/14 0559      [id number] 2706-2376 Total 0600-1759 2831-5176 Total       Intake    P.O.  440  -- 440  350  -- 350    I.V.  300  100 400  300  -- 300    FFP  570  -- 570  --  -- --    Total Intake 1310 100 1410 650 -- 650       Output    Other  1000  -- 1000  --  -- --    Total Output 1000 -- 1000 -- -- --       Net I/O     310 100 410 650 -- 650        Respiratory support:  Oxygen Therapy  SpO2: 100 %  O2 Device: Nasal cannula  O2 Flow Rate (L/min): 2 l/min (Dec from 3L sat 100% to 2L)     Physical Exam:  General:  Well developed, thin elderly Hispanic female in no apparent distress  HEENT:  No scleral icterus, Trachea midline, mucus membranes moist.  Lungs:  Crackles bilaterally  Cardiovascular:  Irregularly irregular  Abdomen:  Abdomen less tense, non tender to palpation, bowel sound present. No peritoneal signs.  Extremities:  Trace peripheral edema. Warm well-perfused.  Skin:  Non-icteric and no visible rash  Neuro/Psych:   Awake. No asterixis    Labs:     CBC  Recent Labs      09/13/2014   0617  10/07/14   0525   WBC  9.9  9.7   HGB  8.6*  8.3*   HCT  24.4*  23.9*   PLT  80*  80*   SEG  88*  92*   LYMPHS  4*  2*   MONOS  7  5      Chemistry  Recent Labs      09/30/2014   0617  09/29/2014   1146  10/07/14   0525   NA  132*  133*  138   K  3.2*  3.6  3.3*   CL  101  102  107   BICARB  16*  17*  16*   BUN  27*  25*  23*   CREAT  0.98*  0.96*  0.97*   GLU  204*  269*  150*   [place]  7.9*  7.8*  8.2*   MG  2.5   --   2.2   PHOS  3.0   --   3.0     Recent Labs      09/22/2014   0617  10/07/14   0525   ALK  104  111   AST  20  23   ALT  15  17   TBILI  2.71*  2.39*   ALB  3.1*  3.4*        Coags  Recent Labs      09/18/2014   0617  10/07/14   0525   PT  24.4*  20.9*   INR  2.2  1.9       Radiology:   CT A/P w/ contrast 09/25/14: FINDINGS:  LUNG BASES: Small to moderate right pleural effusion with right lower lobe   atelectasis  LIVER/BILIARY:Diffuse nodularity and hepatic atrophy. Stable scattered  tiny   low attenuation lesions in the liver.  PANCREAS: Stable numerous pancreatic cysts.  SPLEEN: Unremarkable.  ADRENAL GLANDS: Unremarkable  KIDNEYS: Stable left renal cyst.  STOMACH/DUODENUM: Mild wall thickening from the distal stomach to the proximal  jejunum  VASCULATURE: Patent TIPS. Stable nonocclusive thrombus of the main portal vein  and superior mesenteric vein. New thrombosis of the left portal vein.  LYMPHATIC: Unremarkable  SMALL & LARGE BOWEL: Unremarkable  BLADDER/PELVIC ORGANS: Unremarkable  BONES/SOFT TISSUES: Unremarkable  OTHER: Trace pelvic ascites    IMPRESSION:  1) New thrombosis of the left portal vein. Vessel was patent on prior   abdominal ultrasound 09/27/2014  2) Edema of the distal stomach through proximal jejunum suggestive of   enteritis, nonspecific    CT head 10/01/2014: IMPRESSION:  1. No evidence of acute intracranial hemorrhage or findings to suggest   territorial/transcortical infarct.  2. Mild white matter changes. These may be associated with prior small vessel   ischemia. If there is concern for encephalopathy, MRI would be more helpful   for acute evaluation. MRI screening: No intracranial or intraorbital metal.  3. Frothy secretions within the right sphenoid sinuses well as reactive bone   changes, suggestive of acute on chronic sinusitis.    RUQ U/S 10/03/2014: FINDINGS:  The liver measures 11.3 cm in long axis. It is normal in echogenicity. No   focal lesions are identified. There is no intra or extra hepatic bile duct   dilation. The common bile duct measures four mm. The gallbladder is not   visualized. A patent TIPS catheter is present. The TIPS velocities are: Portal  end 115 ; mid 93 ; hepatic 125 cm/s. Flow in the right and left portal veins   is hepatofugal and towards the TIPS ; flow in the main portal vein is   hepatopetal.  The right kidney measures nine cm in long axis and is within normal limits  with no hydronephrosis or renal calculi.  The visualized portion of the  pancreas again demonstrates multiple cystic   lesions throughout the pancreas, as seen on prior imaging  The visualized aorta and inferior vena cava are within normal limits.  Right pleural effusion. No evidence of intra abdominal ascites.    IMPRESSION:  Patent TIPS catheter with normal velocities and appropriate flow in portal   veins.  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    cxr 10/05/14    Moderate right pleural effusion, increased from radiograph 09/26/2014.   Associated right basal atelectasis.  Increased prominent of the right heart. Dilated azygos arch representing   increased intravascular volume.  No other change.      Prior Endoscopy:   EGD 05/2013:  Findings: 1. Midline orophyarngeal prominence at the  arytenoid  2. Three columnns of grade 1 esophageal varices, no high  risk features  3. No gastric varices, mild portal hypertensive gastropathy  4. Few punctate clots in the antrum  5. Mild duodenal congestion consistent with portal  hypertension  Endoscopic Diagnosis: 1. Midline orophyarngeal  prominence at the arytenoid  2. Three columnns of grade 1 esophageal varices, no high  risk features  3. No gastric varices, mild portal hypertensive gastropathy  4. Few punctate clots in the antrum  5. 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 Continue propranolol 10 BID  2. ENT consultation to evaluate oropharyngeal findings  3. Repeat EGD in 1 year for varices screening        CT scan of the abdomen and pelvis without IV contrast 10/03/14    Compared to the prior CT abdomen pelvis of 09/25/2014, there is new gaseous   distention of the large bowel. No evidence of bowel obstruction, transition   point or pneumatosis intestinalis. There has been interval resolution of   proximal jejunal bowel wall thickening. No other significant changes compared   to the prior study.    Note that assessment  of solid organs is limited in the absence of intravenous   Contrast.    CXR 10/05/14    IMPRESSION:  Moderate right pleural effusion, increased from radiograph 09/15/2014.   Associated right basal atelectasis.  Increased prominent of the right heart. Dilated azygos arch representing   increased intravascular volume.  No other change.    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 Confusion associated with poor medication compliance, including lactulose, N/V, abd pain, diarrhea, and f/c/s. HDS. Work-up here showed GI pathogen panel with norovirus and recent UA consistent with UTI.     #AMS: Resolved. Likely precipitated by infection from UTI and norovirus infection.   -deferring previous plans to downsize TIPS given several infectious precipitants and TIPS downsizing will increase rate of pleural effusion accumulation.    # Sepsis/abdominal pain  Patient with no abdominal pain and bloating today. X ray found with colonic ileus, up 7.9 cm colon 09/24/2014. Improved leukocytosis. Thoracentesis with 1400 wbc/70% neutrophils, with repeat 1L thora on 2/26 with improved PMN count.    -  vanco zosyn (02/23-), need to continue to  complete 7 days for treating possible urinary or pulmonary source  - cont supportive care for norovirus  - ct non con:L gaseous distention, no transition point    #SOB  With increased pulmonary edema and right pleural effusion on the last CXR. SOB overnight, lasix was given.  - cont w diurese with diuresis  -check urine lytes,  - CXR  - wheezy on exam today, try nebs  - thoracentesis 09/13/2014 with 1400 wbc and 70% neutrophils, repeat 1Lthora on 2/16 w improved PMN  count      #)Norovirus: likely responsible for initial N/V, abd pain and diarrhea on presentation  - Follow up blood cultures: NGTD  - Supportive care with IVF and anti-emetics as needed    #)Afib w rvr  S/p diltiazem, now rate controlled. CHADS score 2, chads vsc 4. High risk of fall due to her hepatic encephalopathy, highly concerned for the possibility of anticoagulation. We would hold anticoagulation at this time.  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 and TIPS revision 09/14/14 MELD 23    * HE: still encephalopathic but improved, likely related to infection and TIPS revision,    - Continue lactulose enemas, or po titrate for 3-4 soft bowel movements a day and rifaximin  * Ascites: lasix 20 mg po and spironolactone 100 mg po for increased sob and volume overload (extra iv lasix as needed)  * EV's: S/p TIPS, no longer needs surveillance if TIPS is patent and she is stable  * HHT: S/p TIPS and TIPS revision   - RUQ U/S 2/13 with patent TIPS  * Non-occlusive portal vein thrombosis: Multiple outpatient notes indicate poor medication adherence/knowledge, remains fall risk as well.  Risks outweigh benefits  * [place] surveillance: Last CT on 06/27/2014 showed no HCC. Repeat imaging in May 2016.  * Transplant: Age >70, not a candidate    DWA Dr. Jimmye Norman  GI Fellow

## 2014-10-07 NOTE — Plan of Care (Signed)
Problem: Skin Integrity- Intact patient high risk for impaired skin integrity Braden scale less than or equal to 18  Goal: Skin remains intact  Skin care completed this morning and protective ointment applied to back and buttocks as needed while changing position . Patient has been turn q 2 hrs.Patient is continent.assisted with bedpan.

## 2014-10-08 DIAGNOSIS — I4581 Long QT syndrome: Secondary | ICD-10-CM

## 2014-10-08 DIAGNOSIS — Z136 Encounter for screening for cardiovascular disorders: Secondary | ICD-10-CM

## 2014-10-08 LAB — COMPREHENSIVE METABOLIC PANEL, BLOOD
ALT (SGPT): 16 U/L (ref 0–33)
AST (SGOT): 26 U/L (ref 0–32)
Albumin: 3 g/dL — ABNORMAL LOW (ref 3.5–5.2)
Alkaline Phos: 117 U/L (ref 35–140)
Anion Gap: 15 mmol/L (ref 7–15)
BUN: 23 mg/dL — ABNORMAL HIGH (ref 6–20)
Bicarbonate: 17 mmol/L — ABNORMAL LOW (ref 22–29)
Bilirubin, Tot: 2.71 mg/dL — ABNORMAL HIGH (ref <1.20)
Calcium: 8 mg/dL — ABNORMAL LOW (ref 8.5–10.6)
Chloride: 103 mmol/L (ref 98–107)
Creatinine: 1.01 mg/dL — ABNORMAL HIGH (ref 0.51–0.95)
GFR: 54 mL/min
Glucose: 119 mg/dL — ABNORMAL HIGH (ref 70–115)
Potassium: 3.2 mmol/L — ABNORMAL LOW (ref 3.5–5.1)
Sodium: 135 mmol/L — ABNORMAL LOW (ref 136–145)
Total Protein: 5.9 g/dL — ABNORMAL LOW (ref 6.0–8.0)

## 2014-10-08 LAB — ECG 12-LEAD
ATRIAL RATE: 73 {beats}/min
ECG INTERPRETATION: NORMAL
QRS INTERVAL/DURATION: 70 ms
QT: 422 ms
QTC INTERVAL: 490 ms
R AXIS: 81 degrees
T AXIS: 71 degrees
VENTRICULAR RATE: 81 {beats}/min

## 2014-10-08 LAB — PHOSPHORUS, BLOOD: Phosphorous: 2.6 mg/dL — ABNORMAL LOW (ref 2.7–4.5)

## 2014-10-08 LAB — CBC WITH DIFF, BLOOD
ANC-Manual Mode: 10.9 10*3/uL — ABNORMAL HIGH (ref 1.6–7.0)
Abs Lymphs: 0.3 10*3/uL — ABNORMAL LOW (ref 0.8–3.1)
Abs Monos: 0.3 10*3/uL (ref 0.2–0.8)
Bands: 12 % (ref 0–15)
Hct: 26.5 % — ABNORMAL LOW (ref 34.0–45.0)
Hgb: 9.2 gm/dL — ABNORMAL LOW (ref 11.2–15.7)
Lymphocytes: 3 % — ABNORMAL LOW (ref 19–53)
MCH: 34.1 pg — ABNORMAL HIGH (ref 26.0–32.0)
MCHC: 34.7 % (ref 32.0–36.0)
MCV: 98.1 um3 — ABNORMAL HIGH (ref 79.0–95.0)
MPV: 10.2 fL (ref 9.4–12.4)
Monocytes: 3 % — ABNORMAL LOW (ref 5–12)
Number of Cells Counted: 115
Plt Count: 122 10*3/uL — ABNORMAL LOW (ref 140–370)
Plt Est: DECREASED
RBC: 2.7 10*6/uL — ABNORMAL LOW (ref 3.90–5.20)
RDW: 20.2 % — ABNORMAL HIGH (ref 12.0–14.0)
Segs: 82 % — ABNORMAL HIGH (ref 34–71)
WBC: 11.6 10*3/uL — ABNORMAL HIGH (ref 4.0–10.0)

## 2014-10-08 LAB — PROTHROMBIN TIME, BLOOD
INR: 1.8
PT,Patient: 19.2 s — ABNORMAL HIGH (ref 9.7–12.5)

## 2014-10-08 LAB — GLUCOSE (POCT)
Glucose (POCT): 114 mg/dL (ref 70–115)
Glucose (POCT): 162 mg/dL — ABNORMAL HIGH (ref 70–115)
Glucose (POCT): 180 mg/dL — ABNORMAL HIGH (ref 70–115)
Glucose (POCT): 159 mg/dL — ABNORMAL HIGH (ref 70–115)

## 2014-10-08 LAB — MAGNESIUM, BLOOD: Magnesium: 2.2 mg/dL (ref 1.7–2.6)

## 2014-10-08 MED ORDER — POTASSIUM CHLORIDE CRYS CR 10 MEQ OR TBCR
40.00 meq | EXTENDED_RELEASE_TABLET | Freq: Once | ORAL | Status: AC
Start: 2014-10-08 — End: 2014-10-08
  Administered 2014-10-08: 40 meq via ORAL
  Filled 2014-10-08: qty 4

## 2014-10-08 NOTE — Plan of Care (Signed)
Problem: Discharge Planning  Goal: Participation in care planning  Outcome: Not Met  Patient unable to work with PT/OT. Will keep encouraging and get patient OOB    Problem: Falls, Risk of  Goal: Ensure safe mobility of patient (Mobility)  Outcome: Met  Fall plan implemented. Bed in low position, bed rails x 2, bed alarm on, call light within reach, non skid  Socks on. Patient verbalizes understanding of call light use. Family at bedside        Problem: Tissue Perfusion, Cardiopulmonary - Altered  Goal: Hemodynamic stability  Outcome: Met  Controlled AFIB 90s    Problem: Pain - Acute  Goal: Communication of presence of pain  Outcome: Met  Patient able verbalize pain to staff. Denies pain at this time.     Problem: Alteration in Blood Glucose  Goal: Glucose level within specified parameters  Outcome: Met  Continue Qachs blood glucose check. Insulin coverage as ordered    Problem: Infection  Goal: Absence of infection signs and symptoms  Outcome: Met   Afebrile. Continue ABT as ordered. WBC 9.7    Problem: Skin Integrity- Intact patient high risk for impaired skin integrity Braden scale less than or equal to 18  Goal: Skin remains intact  Outcome: Met  Kept clean and dry. Continue to monitor. On air mattress. Turn q2hrs    Problem: Aspiration, Risk of  Goal: Evidence of swallowing difficulties is absent  Outcome: Met  Supervised when eating. Continue to monitor.     Problem: Breathing Pattern - Ineffective  Goal: Respiratory rate, rhythm and depth return to baseline  Outcome: Met  On 2lNC no sob, continue to monitor.

## 2014-10-08 NOTE — Progress Notes (Signed)
Inpatient Medicine Progress Note       Interval Events/Subjective:   No acute events overnight.  Pt says she feels "the same".  Oxygen requirements decreased from 2L to 1L NC.  LE edema stable.  Tolerating diet.    Vitals signs:     Latest Entry  Range (last 24 hours)    Temperature: 98.1 F (36.7 C)  Temp  Avg: 98.1 F (36.7 C)  Min: 97.9 F (36.6 C)  Max: 98.4 F (36.9 C)    Blood pressure (BP): (!) 127/32 mmHg  BP  Min: 110/41  Max: 138/43    Heart Rate: 91  Pulse  Avg: 91.6  Min: 82  Max: 101    Respirations: 22  Resp  Avg: 16.8  Min: 13  Max: 22    SpO2: 99 %  SpO2  Avg: 99 %  Min: 98 %  Max: 100 %       No Data Recorded     Yesterday's intake and output:  02/27 0600 - 02/28 0559  In: 2683 [P.O.:690; I.V.:600]  Out: 250 [Urine:250]    Physical Exam  GENERAL:  Thin appearing woman in NAD.  Pleasant and cooperative.     HEENT: EOMI. Oropharynx appears moist. No LAD.   CARDIOVASCULAR:  Tachycardic and irregular rhythm.   LUNGS: CTA on L, absent bs on R, +wheezing  ABDOMEN: Soft, non-distended. Normoactive bowel sounds. Non-tender  EXTREMITIES: Warm and well perfused.   No asterixis     Labs seen and reviewed.       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     # acute respiratory failure: 2/2 increased pleural effusion.  Improving with diuresis and thoracentesis 2/26, cont diuretics today.  On vanc/zosyn for possible hcap/sbe.  Repeat cxr tomorrow am.    #sepsis: suspect from UTI or infected pleural fluid (SBE) vs HCAP. Wbc rose on ctx but now improved on vanc/zosyn  - cont vanc zosyn (plan for 5-7d course) stop today as improving  - f/u pleural fluid cultures; resend pleural fluid for analysis with repeat thoracentesis -> fluid transudative, improved wbc  - f/u urine culture -> growing candida, consider treating if worsens    # colonic dilation: less  likely 2/2 cdiff as negative studies.  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 Clinically improving    #AMS: multifactorial, contribution of HE + infection with norovirus and now possibly UTI. Improving.   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 Abdominal ultrasound on admission shows patency of vessels.   --Hepatic encephalopathy: continue lactulose/rifaximin, Improving. defer downsizing of TIPS given that downsizing will likely lead to recurrent re accumulating HHT.   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   --Ascites: lasix 20/aldactone 100. S/p TIPS.    --HCC: CT 06/2014 without lesions c/f HCC, repeat due in May.     #Afib: Initially in RVR but HR now better controlled on oral diltiazem  -continue diltiazem, change to ER once dose determined   -Previous team had extensive discussion with patient and family regarding risk/benefits of anticoagulation and family wanted to pursue [facility] with xarelto however will defer beginning ac at this time  given patient is at a high fall risk in setting of acute encephalopathy. Patient and family expressed agreement with this plan and discussion regarding ac will be revisited pending improvement in her encephalopathy.     #norovirus: diarrhea resolved. Dc contact precautions  --encourage oral intake     #DM2: not well controlled   --cont lantus to 35units and 12units lispro qac per endocrine recs  --add insulin coverage for glucerna supplements   --high intensity SSI     #Osteoporosis  --Continue home calcium and vitamin D    #GERD:   --Continue pepcid 20 mg daily    # moderate malnutrition: cont glucerna.  appreciate nutrition recs.     VTE PPx: SQ heparin   CODE : Full Code  DISPO: Pending clinical improvement.

## 2014-10-08 NOTE — Progress Notes (Signed)
Hepatology Progress Note  Consult Attending: Dr. Marykay Lex, MD    24h events:    - feels unchanged from yesterday, CXR improved, but still w mod R plerual effusion      Past Medical History:  Past Medical History   Diagnosis Date    Migraine headache     Glaucoma     Ascites 06/29/2007     History of paracentesis x 2    Pancreatic cyst 06/29/2007    Cirrhosis ([place]) 06/24/2007     Active on the liver transplant waiting list with a blood type of O positive. 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 Due to Hep C    Osteopenia 06/24/2007    Emphysema 06/24/2007    EV (esophageal varices) ([place]) 06/10/2007    Anemia 06/10/2007    HCV (hepatitis C virus) 06/10/2007     Achieved SVR.    Type II or unspecified type diabetes mellitus with peripheral circulatory disorders, not stated as uncontrolled(250.70) 06/10/2007    HTN 06/10/2007    HTN 06/10/2007       Past Surgical History:   Past Surgical History   Procedure Laterality Date    Pb laps surg cholecstc w/expl common duct  1970s     in [place];  open CCX    Pb cesarean delivery only      Pb cstoplasty/cstourtp plstc any       Bladder suspension    Egd procedure  06/01/2013     Procedure: GI EGD;  Surgeon: Jeanette Caprice, MD;  Location: [facility];  Service: Gastroenterology;;    Tips procedure         Allergies:  No Known Allergies    Home Medications:  Prescriptions prior to admission   Medication Sig Dispense Refill Last Dose    alendronate (FOSAMAX) 70 MG tablet    09/22/2014    ALPHAGAN P 0.1 % ophthalmic solution    09/22/2014    bacitracin 500 UNIT/GM ointment Apply small amount to affected area on face twice daily 1 Tube 1 09/22/2014    CALCIUM CARBONATE 600 MG OR TABS 1 tablet 2 times daily one month 11 09/22/2014    ciprofloxacin (CIPRO) 500 MG tablet Take 1 tablet by mouth daily. 30 tablet 5 09/22/2014    furosemide (LASIX) 20 MG tablet Take 2 tablets by mouth daily. 90 tablet 0 09/22/2014    glipiZIDE (GLUCOTROL) 5 MG tablet Take 0.5  tablets (2.5 mg) by mouth 2 times daily. 30 tablet 1 09/22/2014    insulin glargine (LANTUS) 100 UNIT/ML injection Inject 20 Units under the skin daily. 10 mL 1 09/22/2014    lactulose 10 GM/15ML solution Take 30 mLs (20 g) by mouth 3 times daily. 946 mL 0 09/22/2014    LUMIGAN 0.01 %    09/22/2014    multivitamin (MULTI-VITAMIN) tablet Take 1 tablet by mouth daily.   09/22/2014    ranitidine (ZANTAC) 300 MG tablet Take 300 mg by mouth 2 times daily.   09/22/2014    rifaximin (XIFAXAN) 550 MG tablet Take 550 mg by mouth every 12 hours.   09/22/2014    sitagliptin (JANUVIA) 50 MG tablet Take 1 tablet (50 mg) by mouth daily. 60 tablet 1 09/22/2014    spironolactone (ALDACTONE) 50 MG tablet Take 1 tablet (50 mg) by mouth daily. 30 tablet 0 09/22/2014    VITAMIN D 400 UNIT OR CAPS 1 CAPSULE DAILY 30 11 09/22/2014       Inpatient Medications:  Scheduled Meds  cholecalciferol  400 Units Daily    diltiazem  45 mg Q6H    famotidine  20 mg Daily    furosemide  20 mg Daily    insulin glargine  35 Units Daily    insulin lispro  1-10 Units 4x Daily WC    insulin lispro  12 Units TID AC    insulin lispro  8 Units TID WC    lactulose  20 g BID    multivitamin  5 mL Daily    rifaximin  550 mg Q12H    sodium chloride (PF)  3 mL Q8H    spironolactone  100 mg Daily     IV Meds   sodium chloride       OBJECTIVE:   Vitals signs:   Latest Entry  Range (last 24 hours)    Temperature: 97.9 F (36.6 C)  Temp  Avg: 98.1 F (36.7 C)  Min: 97.9 F (36.6 C)  Max: 98.4 F (36.9 C)    Blood pressure (BP): 145/51 mmHg  BP  Min: 113/50  Max: 145/51    Heart Rate: 93  Pulse  Avg: 90.7  Min: 82  Max: 102    Respirations: 15  Resp  Avg: 16.7  Min: 14  Max: 22    SpO2: 94 %  SpO2  Avg: 98.2 %  Min: 94 %  Max: 99 %       No Data Recorded     Weight - scale: (!) 47.8 kg (105 lb 6.1 oz)  Percentage Weight Change (%): -3.82 %    Intake/Output       10/07/14 0600 - 10/08/14 0559 10/08/14 0600 - 09/17/2014 0559      [id number] Total  0600-1759 [id number] Total       Intake    P.O.  690  -- 690  400  -- 400    I.V.  300  300 600  300  -- 300    Total Intake [phone] 700 -- 700       Output    Urine  250  -- 250  --  -- --    Total Output 250 -- 250 -- -- --       Net I/O     [phone] 700 -- 700        Respiratory support:  Oxygen Therapy  SpO2: 94 %  O2 Device: Nasal cannula  O2 Flow Rate (L/min): 1 l/min     Physical Exam:  General:  Well developed, thin elderly Hispanic female in no apparent distress  HEENT:  No scleral icterus, Trachea midline, mucus membranes moist.  Lungs:  Crackles bilaterally  Cardiovascular:  Irregularly irregular  Abdomen:  Abdomen less tense, non tender to palpation, bowel sound present. No peritoneal signs.  Extremities:  Trace peripheral edema. Warm well-perfused.  Skin:  Non-icteric and no visible rash  Neuro/Psych:   Awake. No asterixis    Labs:     CBC  Recent Labs      10/07/14   0525  10/08/14   0400   WBC  9.7  11.6*   HGB  8.3*  9.2*   HCT  23.9*  26.5*   PLT  80*  122*   BAND   --   12   SEG  92*  82*   LYMPHS  2*  3*   MONOS  5  3*      Chemistry  Recent Labs  10/07/14   0525  10/08/14   0400   NA  138  135*   K  3.3*  3.2*   CL  107  103   BICARB  16*  17*   BUN  23*  23*   CREAT  0.97*  1.01*   GLU  150*  119*   [place]  8.2*  8.0*   MG  2.2  2.2   PHOS  3.0  2.6*     Recent Labs      10/07/14   0525  10/08/14   0400   ALK  111  117   AST  23  26   ALT  17  16   TBILI  2.39*  2.71*   ALB  3.4*  3.0*        Coags  Recent Labs      10/07/14   0525  10/08/14   0400   PT  20.9*  19.2*   INR  1.9  1.8       Radiology:   CT A/P w/ contrast 09/25/14: FINDINGS:  LUNG BASES: Small to moderate right pleural effusion with right lower lobe   atelectasis  LIVER/BILIARY:Diffuse nodularity and hepatic atrophy. Stable scattered tiny   low attenuation lesions in the liver.  PANCREAS: Stable numerous pancreatic cysts.  SPLEEN: Unremarkable.  ADRENAL GLANDS: Unremarkable  KIDNEYS: Stable left renal  cyst.  STOMACH/DUODENUM: Mild wall thickening from the distal stomach to the proximal  jejunum  VASCULATURE: Patent TIPS. Stable nonocclusive thrombus of the main portal vein  and superior mesenteric vein. New thrombosis of the left portal vein.  LYMPHATIC: Unremarkable  SMALL & LARGE BOWEL: Unremarkable  BLADDER/PELVIC ORGANS: Unremarkable  BONES/SOFT TISSUES: Unremarkable  OTHER: Trace pelvic ascites    IMPRESSION:  1) New thrombosis of the left portal vein. Vessel was patent on prior   abdominal ultrasound 10/01/2014  2) Edema of the distal stomach through proximal jejunum suggestive of   enteritis, nonspecific    CT head 09/25/2014: IMPRESSION:  1. No evidence of acute intracranial hemorrhage or findings to suggest   territorial/transcortical infarct.  2. Mild white matter changes. These may be associated with prior small vessel   ischemia. If there is concern for encephalopathy, MRI would be more helpful   for acute evaluation. MRI screening: No intracranial or intraorbital metal.  3. Frothy secretions within the right sphenoid sinuses well as reactive bone   changes, suggestive of acute on chronic sinusitis.    RUQ U/S 10/03/2014: FINDINGS:  The liver measures 11.3 cm in long axis. It is normal in echogenicity. No   focal lesions are identified. There is no intra or extra hepatic bile duct   dilation. The common bile duct measures four mm. The gallbladder is not   visualized. A patent TIPS catheter is present. The TIPS velocities are: Portal  end 115 ; mid 93 ; hepatic 125 cm/s. Flow in the right and left portal veins   is hepatofugal and towards the TIPS ; flow in the main portal vein is   hepatopetal.  The right kidney measures nine cm in long axis and is within normal limits   with no hydronephrosis or renal calculi.  The visualized portion of the pancreas again demonstrates multiple cystic   lesions throughout the pancreas, as seen on prior imaging  The visualized aorta and inferior vena cava are within normal  limits.  Right pleural effusion. No evidence of intra abdominal ascites.    IMPRESSION:  Patent TIPS catheter with normal velocities and appropriate flow in portal   veins.  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    cxr 10/05/14    Moderate right pleural effusion, increased from radiograph 09/12/2014.   Associated right basal atelectasis.  Increased prominent of the right heart. Dilated azygos arch representing   increased intravascular volume.  No other change.      Prior Endoscopy:   EGD 05/2013:  Findings: 1. Midline orophyarngeal prominence at the  arytenoid  2. Three columnns of grade 1 esophageal varices, no high  risk features  3. No gastric varices, mild portal hypertensive gastropathy  4. Few punctate clots in the antrum  5. Mild duodenal congestion consistent with portal  hypertension  Endoscopic Diagnosis: 1. Midline orophyarngeal  prominence at the arytenoid  2. Three columnns of grade 1 esophageal varices, no high  risk features  3. No gastric varices, mild portal hypertensive gastropathy  4. Few punctate clots in the antrum  5. 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 Continue propranolol 10 BID  2. ENT consultation to evaluate oropharyngeal findings  3. Repeat EGD in 1 year for varices screening        CT scan of the abdomen and pelvis without IV contrast 10/03/14    Compared to the prior CT abdomen pelvis of 09/25/2014, there is new gaseous   distention of the large bowel. No evidence of bowel obstruction, transition   point or pneumatosis intestinalis. There has been interval resolution of   proximal jejunal bowel wall thickening. No other significant changes compared   to the prior study.    Note that assessment of solid organs is limited in the absence of intravenous   Contrast.    CXR 10/05/14    IMPRESSION:  Moderate right pleural effusion, increased from radiograph  10/04/2014.   Associated right basal atelectasis.  Increased prominent of the right heart. Dilated azygos arch representing   increased intravascular volume.  No other change.    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 Confusion associated with poor medication compliance, including lactulose, N/V, abd pain, diarrhea, and f/c/s. HDS. Work-up here showed GI pathogen panel with norovirus and recent UA consistent with UTI.     #AMS: Resolved. Likely precipitated by infection from UTI and norovirus infection.   -deferring previous plans to downsize TIPS given several infectious precipitants and TIPS downsizing will increase rate of pleural effusion accumulation.    # Sepsis/abdominal pain  Patient with no abdominal pain and bloating today. X ray found with colonic ileus, up 7.9 cm colon 09/28/2014. Improved leukocytosis. Thoracentesis with 1400 wbc/70% neutrophils, with repeat 1L thora on 2/26 with improved PMN count.    -  Dc vanco zosyn (02/23-7/28), given negative cultures, overall improvement  - cont supportive care for norovirus  - ct non con:L gaseous distention, no transition point    #SOB  With increased pulmonary edema and right pleural effusion on the last CXR. SOB overnight, lasix was given.  - cont w diurese  - CXR tmr  - prn nebs   -  thoracentesis 10/01/2014 with 1400 wbc and 70% neutrophils, repeat 1Lthora on 2/16 w improved PMN count      #)Norovirus: likely responsible for initial N/V, abd pain and diarrhea on presentation  - Follow up blood cultures: NGTD  - Supportive care with IVF and anti-emetics as needed    #)Afib w rvr  S/p diltiazem, now rate controlled. CHADS score 2, chads vsc 4.  High risk of fall due to her hepatic encephalopathy, highly concerned for the possibility of anticoagulation. We would hold anticoagulation at this time.  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 and TIPS revision 09/14/14 MELD 23    * HE: still encephalopathic but improved, likely related to infection and TIPS revision,    - Continue lactulose enemas, or po titrate for 3-4 soft bowel movements a day and rifaximin  * Ascites: lasix 20 mg po and spironolactone 100 mg po for increased sob and volume overload (extra iv lasix as needed)  * EV's: S/p TIPS, no longer needs surveillance if TIPS is patent and she is stable  * HHT: S/p TIPS and TIPS revision   - RUQ U/S 2/13 with patent TIPS  * Non-occlusive portal vein thrombosis: Multiple outpatient notes indicate poor medication adherence/knowledge, remains fall risk as well.  Risks outweigh benefits  * [place] surveillance: Last CT on 06/27/2014 showed no HCC. Repeat imaging in May 2016.  * Transplant: Age >70, not a candidate    DWA Dr. Jimmye Norman  GI Fellow

## 2014-10-08 NOTE — Plan of Care (Addendum)
UPDATE: Pt continues in Afib today. Intermittent episodes of SOB, but no increase in supplemental O2 needed. Remains at 1L via NC.  Was up in wheelchair for about 40 minutes and went for ride in the hall today.     Problem: Discharge Planning  Goal: Participation in care planning  Pt and family fully participative in care/ discharge planning.     Problem: Falls, Risk of  Goal: Keep patient free from falls utilizing universal fall precautions  Pt and family call appropriately for assistance.     Problem: Tissue Perfusion, Cardiopulmonary - Altered  Goal: Hemodynamic stability  VSS. Remains afebrile.     Problem: Pain - Acute  Goal: Communication of presence of pain  Pt has prn medication available for abdominal pain & migraines.     Problem: Alteration in Blood Glucose  Goal: Glucose level within specified parameters  Q6 blood glucose monitoring. Sliding scale and scheduled insulin in place.     Problem: Skin Integrity- Intact patient high risk for impaired skin integrity Braden scale less than or equal to 18  Goal: Skin remains intact  Dressing to L forearm.    Problem: Breathing Pattern - Ineffective  Goal: Respiratory rate, rhythm and depth return to baseline  Continues to cough spontaneously. Coughing up this white sputum. Had episode of SOB with increased crackles/coughing at about 1030. MD notified and RT at bedside for breathing treatment.

## 2014-10-08 NOTE — Progress Notes (Signed)
ATTENDING PROGRESS NOTE ATTESTATION  Interval history:  Wt continues to trend down  See fellow's history and physical for further details of the patient's history.    Objective  I have examined the patient and concur with the fellow's exam.  Labs were reviewed.  Cr 1.01    Assessment and Plan  I agree with the fellow's care plan.  63F with HCV gt2 cirrhosis (SVR) c/b ascites and HHthor s/p TIPS 06/28/14, s/p TIPS revision 09/14/14, admitted for HE. Issues with noncompliance as well as norovirus infection. GIven her rapid improvement of hepatic encephalopathy with rifaximin and lactulose, will defer downsizing the transjugular intrahepatic portosystemic shunt. Course c/b development of UTI+ ?infected pleural fluid, colonic ileus.   -- infection: on v/z.  presumed UTI+?infected pleural fluid. Stop abx today.   -- respiratory: s/p 1L thora 2/27. diuretics 100 spiro/20 lasix. Repeat cxr tomorrow. still volume up on exam. Some improvement with nebs.   -- afib: eval after acute issues resolve. would not start it if she continues to have episode of hepatic encephalopathy making her a high fall risk.   -- PT/OT  -- resume calorie count    See the fellow's history and physical for further details.  [name] MD, PID [id number]

## 2014-10-09 ENCOUNTER — Ambulatory Visit (INDEPENDENT_AMBULATORY_CARE_PROVIDER_SITE_OTHER): Payer: 59 | Admitting: Ophthalmology

## 2014-10-09 ENCOUNTER — Encounter (HOSPITAL_COMMUNITY): Admission: EM | Disposition: E | Payer: Self-pay | Attending: Internal Medicine

## 2014-10-09 ENCOUNTER — Encounter (INDEPENDENT_AMBULATORY_CARE_PROVIDER_SITE_OTHER): Payer: 59

## 2014-10-09 DIAGNOSIS — R0602 Shortness of breath: Secondary | ICD-10-CM

## 2014-10-09 DIAGNOSIS — R001 Bradycardia, unspecified: Secondary | ICD-10-CM

## 2014-10-09 DIAGNOSIS — I491 Atrial premature depolarization: Secondary | ICD-10-CM

## 2014-10-09 DIAGNOSIS — I498 Other specified cardiac arrhythmias: Secondary | ICD-10-CM

## 2014-10-09 LAB — CBC WITH DIFF, BLOOD
ANC-Automated: 10.7 10*3/uL — ABNORMAL HIGH (ref 1.6–7.0)
Abs Lymphs: 0.6 10*3/uL — ABNORMAL LOW (ref 0.8–3.1)
Abs Monos: 0.9 10*3/uL — ABNORMAL HIGH (ref 0.2–0.8)
Hct: 28.3 % — ABNORMAL LOW (ref 34.0–45.0)
Hgb: 9.5 gm/dL — ABNORMAL LOW (ref 11.2–15.7)
Imm Gran %: 1 % (ref <1)
Imm Gran Abs: 0.1 10*3/uL (ref <0.1)
Lymphocytes: 5 % — ABNORMAL LOW (ref 19–53)
MCH: 33.5 pg — ABNORMAL HIGH (ref 26.0–32.0)
MCHC: 33.6 % (ref 32.0–36.0)
MCV: 99.6 um3 — ABNORMAL HIGH (ref 79.0–95.0)
MPV: 10 fL (ref 9.4–12.4)
Monocytes: 8 % (ref 5–12)
Plt Count: 138 10*3/uL — ABNORMAL LOW (ref 140–370)
RBC: 2.84 10*6/uL — ABNORMAL LOW (ref 3.90–5.20)
RDW: 20.5 % — ABNORMAL HIGH (ref 12.0–14.0)
Segs: 87 % — ABNORMAL HIGH (ref 34–71)
WBC: 12.4 10*3/uL — ABNORMAL HIGH (ref 4.0–10.0)

## 2014-10-09 LAB — ARTERIAL BLOOD GAS
BE, Art: -4.4 mmol/L — ABNORMAL LOW (ref <=2.3)
Flow Rate: 3 L/min
HCO3, Art: 19 mmol/L — ABNORMAL LOW (ref 20–29)
O2 Sat, Art (Est): 96.6 % (ref 94–100)
Temp: 36.6 'C
pCO2, Art (T): 32 mmHg — ABNORMAL LOW (ref 36–46)
pCO2, Art (Uncorr): 32 mmHg (ref 36–46)
pH, Art (T): 7.4 (ref 7.35–7.46)
pH, Art (Uncorr): 7.4 (ref 7.35–7.46)
pO2, Art (T): 83 mmHg (ref 74–109)
pO2, Art (Uncorr): 86 mmHg (ref 74–109)

## 2014-10-09 LAB — BODY FLUID CELL CT / DIFF
Fluid RBC mm3: 1850 uL
Fluid WBC mm3: 202 uL
Lymphocytes fluid %: 40 %
Macrophages fluid %: 41 %
Mesothelial Cells Fluid %: 4 %
Neutrophils Fluid %: 15 %

## 2014-10-09 LAB — PHOSPHORUS, BLOOD: Phosphorous: 3 mg/dL (ref 2.7–4.5)

## 2014-10-09 LAB — GLUCOSE (POCT)
Glucose (POCT): 88 mg/dL (ref 70–115)
Glucose (POCT): 113 mg/dL (ref 70–115)
Glucose (POCT): 231 mg/dL — ABNORMAL HIGH (ref 70–115)
Glucose (POCT): 213 mg/dL — ABNORMAL HIGH (ref 70–115)

## 2014-10-09 LAB — COMPREHENSIVE METABOLIC PANEL, BLOOD
ALT (SGPT): 16 U/L (ref 0–33)
AST (SGOT): 23 U/L (ref 0–32)
Albumin: 3.1 g/dL — ABNORMAL LOW (ref 3.5–5.2)
Alkaline Phos: 118 U/L (ref 35–140)
Anion Gap: 14 mmol/L (ref 7–15)
BUN: 25 mg/dL — ABNORMAL HIGH (ref 6–20)
Bicarbonate: 16 mmol/L — ABNORMAL LOW (ref 22–29)
Bilirubin, Tot: 2.87 mg/dL — ABNORMAL HIGH (ref <1.20)
Calcium: 8.2 mg/dL — ABNORMAL LOW (ref 8.5–10.6)
Chloride: 103 mmol/L (ref 98–107)
Creatinine: 1.03 mg/dL — ABNORMAL HIGH (ref 0.51–0.95)
GFR: 52 mL/min
Glucose: 94 mg/dL (ref 70–115)
Potassium: 4 mmol/L (ref 3.5–5.1)
Sodium: 133 mmol/L — ABNORMAL LOW (ref 136–145)
Total Protein: 6.1 g/dL (ref 6.0–8.0)

## 2014-10-09 LAB — MAGNESIUM, BLOOD: Magnesium: 2.3 mg/dL (ref 1.7–2.6)

## 2014-10-09 LAB — PROTHROMBIN TIME, BLOOD
INR: 1.8
PT,Patient: 19.5 s — ABNORMAL HIGH (ref 9.7–12.5)

## 2014-10-09 LAB — ECG 12-LEAD
ATRIAL RATE: 82 {beats}/min
PR INTERVAL: 192 ms
QRS INTERVAL/DURATION: 74 ms
QT: 398 ms
QTC INTERVAL: 464 ms
R AXIS: 38 degrees
T AXIS: 94 degrees
VENTRICULAR RATE: 82 {beats}/min

## 2014-10-09 LAB — TOTAL PROTEIN, OTHER: Total Protein, Other: 1.8 g/dL

## 2014-10-09 LAB — PREPARE FFP/THAWED PLASMA

## 2014-10-09 LAB — ANAEROBIC CULTURE

## 2014-10-09 SURGERY — IR THORACENTESIS

## 2014-10-09 MED ORDER — INSULIN GLARGINE 100 UNIT/ML SC SOLN
28.0000 [IU] | Freq: Every day | SUBCUTANEOUS | Status: DC
Start: 2014-10-09 — End: 2014-10-11
  Administered 2014-10-09 – 2014-10-11 (×3): 28 [IU] via SUBCUTANEOUS
  Filled 2014-10-09 (×3): qty 28

## 2014-10-09 MED ORDER — LIDOCAINE, BUFFERED 1% IJ SOLN (COMPOUNDED)
INTRADERMAL | Status: DC | PRN
Start: 2014-10-09 — End: 2014-10-09
  Administered 2014-10-09: 5 mL via INTRADERMAL

## 2014-10-09 MED ORDER — FUROSEMIDE 10 MG/ML IJ SOLN
40.00 mg | Freq: Once | INTRAMUSCULAR | Status: AC
Start: 2014-10-09 — End: 2014-10-09
  Administered 2014-10-09: 40 mg via INTRAVENOUS
  Filled 2014-10-09: qty 4

## 2014-10-09 SURGICAL SUPPLY — 4 items
CATHETER CENTESIS 1-STEP 4FR 7CM (Lines/Drains) IMPLANT
COVER PROBE MICROTEK INTRAOPERATIVE 5" X 96" PC1308 (Drapes/towels) ×2 IMPLANT
PREP CHLOROPREP 10.5ML 1-STEP (Misc Medical Supply) IMPLANT
TRAY THORACENTESIS (Kits/Sets/Trays) ×2

## 2014-10-09 NOTE — Progress Notes (Signed)
Endocrine/Diabetes Progress Note    Overnight:  No acute events    Subjective: pt resting comfortably, but reports ongoing SOB; family at bedside    Objective:   Temperature:  [97.7 F (36.5 C)-98.2 F (36.8 C)] 97.7 F (36.5 C) (02/29 1653)  Blood pressure (BP): (110-141)/(44-63) 115/45 mmHg (02/29 1653)  Heart Rate:  [85-115] 108 (02/29 1653)  Respirations:  [17-26] 20 (02/29 1653)  Pain Score:  [-] 0 (02/29 1645)  O2 Device:  [-] Nasal cannula (02/29 1645)  O2 Flow Rate (L/min):  [2 l/min-3 l/min] 2 l/min (02/29 1645)  SpO2:  [93 %-100 %] 98 % (02/29 1653)  Body mass index is 21.34 kg/(m^2).    GEN: NAD, chronically ill appearing  PULM: breathing comfortably this am on O2    Diet: pureed carb limited + glucerna TID    Blood Sugars reviewed:        AM  Lu  Di  HS  O/N    2/28  114 162 180 159  Lantus   35  Lispro  8+6 8+3 6+3    2/29  88 113  Lantus   28  Lispro  12+8     A/P:  Paula Manning is a 74 year old female with uncontrolled DM2 c/b neuropathy as well as HepC cirrhosis, HTN, glaucoma and migraines admitted 09/16/2014 w/ n/v and AMS.     Fasting BG trending < 100mg /dL, will decrease Lantus dosing.    Inpatient Recs:  - dec lantus  35-->28 units qday  - cont Lispro 12 units qac meals (RN to give 0, 1/2, whole dose based on % tray consumed)  - cont Lispro 8 units per can of Glucerna once diet advances (RN to give 0, 1/2, whole dose based on % tray consumed)  - continue high Lispro correction scale q ac/hs    Outpatient Recs:  Was on Lantus 20 units qhs, Januvia 50 mg qday and glipizide 2.5 mg bid w/ A1C 6.2%, but home BG 200-400s in setting of OJ-carrot juice and ++rice/beans intake. Final doses TBD based on inpatient needs.    will cont to follow

## 2014-10-09 NOTE — Plan of Care (Signed)
Problem: Discharge Planning  Goal: Participation in care planning  Outcome: Met  Education is primarily given to family (patient is spanish speaking only) to explain to the patient. No discharge plans at this time. Patient shows compliance to plan of care.     Problem: Falls, Risk of  Goal: Keep patient free from falls utilizing universal fall precautions  Outcome: Met  Patient is at [place] for falls. Bed on lowest position, locked, exit alarm on. Hourly rounding done and needs attended. Family at bedside at all times during the day. Side rails up x3, yellow socks on. No falls or injuries this shift.     Problem: Tissue Perfusion, Cardiopulmonary - Altered  Goal: Hemodynamic stability  Outcome: Met  Remains a-fib on tele monitor, controlled ventricular rate. Patient denies chest pain or palpitations. Afebrile.     Problem: Pain - Acute  Goal: Communication of presence of pain  Outcome: Met  Patient has been denying pain during assessment, only complains of shortness of breath. Bazick MD made aware.     Problem: Alteration in Blood Glucose  Goal: Glucose level within specified parameters  Outcome: Met  Insulin coverage given as ordered. Patient with no episodes of hyperglycemia/hypoglycemia noted. Will continue to check blood glucose as ordered.     Problem: Infection  Goal: Absence of infection signs and symptoms  Outcome: Met  Patient is afebrile. Completed antibiotic treatment on 02/28. WBC trending up today.      Results for FLORIE, CARICO (MRN [id number]) as of 09/20/2014 16:11    Ref. Range 09/15/2014 05:56   WBC Latest Ref Range: 4.0-10.0 1000/mm3 12.4 (H)           Problem: Skin Integrity- Intact patient high risk for impaired skin integrity Braden scale less than or equal to 18  Goal: Skin remains intact  Outcome: Met  Patient is repositioned every 2 hours and as needed. On air mattress overlay. Skin tear to both arms covered with dressings. Peri care done with every diaper change. Skin kept clean and  dry.     Problem: Aspiration, Risk of  Goal: Evidence of swallowing difficulties is absent  Outcome: Met  Patient is tolerating pureed carb limited diet and is able to swallow pills without crushing. HOB maintained upright when eating or taking medications. No episodes of aspiration found.     Problem: Breathing Pattern - Ineffective  Goal: Respiratory rate, rhythm and depth return to baseline  Outcome: Met  Patient reports increase in labored breathing. Bazick MD made aware and orders received for chest x-ray, ABG and Lasix IV. Found patient to have pleural effusion to both lungs (per chest x-ray result), received order for thoracentesis. Patient currently in Interventional Radiology for the said procedure.

## 2014-10-09 NOTE — Progress Notes (Signed)
Inpatient Medicine Progress Note       Interval Events/Subjective:   No acute events overnight.  Pt weaned to RA this am but developed acute worsening of sob around 10am.  CXR and lasix 40 mg iv ordered.    Vitals signs:     Latest Entry  Range (last 24 hours)    Temperature: 97.8 F (36.6 C)  Temp  Avg: 97.9 F (36.6 C)  Min: 97.7 F (36.5 C)  Max: 98.2 F (36.8 C)    Blood pressure (BP): 126/54 mmHg  BP  Min: 111/56  Max: 145/51    Heart Rate: 93  Pulse  Avg: 92  Min: 85  Max: 102    Respirations: 21  Resp  Avg: 20.5  Min: 15  Max: 24    SpO2: 97 %  SpO2  Avg: 96 %  Min: 93 %  Max: 98 %       No Data Recorded     Yesterday's intake and output:  02/28 0600 - 02/29 0559  In: 703 [P.O.:400; I.V.:303]  Out: -     Physical Exam  GENERAL:  Thin appearing woman in NAD.  Pleasant and cooperative.     HEENT: EOMI. Oropharynx appears moist. No LAD.   CARDIOVASCULAR:  Tachycardic and irregular rhythm.   LUNGS: CTA on L, absent bs on R, no wheezing  ABDOMEN: Soft, non-distended. Normoactive bowel sounds. Non-tender  EXTREMITIES: Warm and well perfused.   No asterixis     Labs seen and reviewed.       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     # acute respiratory failure: 2/2 increased pleural effusion. Had improvement after thoracentesis on 2/26 but now with inc dyspnea, likely from reaccumulation of fluid.  Less likely new infection. Repeat cxr, lasix 40 mg iv x1 with foley placement to measure UOP, nebs prn, ir consult for repeat thoracentesis, check abg, icu eval if not improving    #sepsis: suspect from UTI or infected pleural fluid (SBE) vs HCAP. Wbc rose on ctx but now improved on vanc/zosyn  - completed vanc zosyn x6d, stopped 2/28  - f/u pleural fluid cultures; resend pleural fluid for analysis with repeat thoracentesis -> fluid transudative, improved  wbc  - f/u urine culture -> growing candida, consider treating if worsens    # colonic dilation: less likely 2/2 cdiff as negative studies.  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 Clinically improving    #AMS: multifactorial, contribution of HE + infection with norovirus and now possibly UTI. Improving.   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 Abdominal ultrasound on admission shows patency of vessels.   --Hepatic encephalopathy: continue lactulose/rifaximin, Improving. defer downsizing of TIPS given that downsizing will likely lead to recurrent re accumulating HHT.   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   --Ascites: lasix 20/aldactone 100 -> gave addl lasix 40 mg iv for dyspnea. S/p TIPS.    --HCC: CT 06/2014 without lesions c/f HCC, repeat due in May.     #Afib: Initially in RVR but HR now better controlled on oral diltiazem  -continue diltiazem, change to ER once dose determined   -  Previous team had extensive discussion with patient and family regarding risk/benefits of anticoagulation and family wanted to pursue [facility] with xarelto however will defer beginning ac at this time given patient is at a high fall risk in setting of acute encephalopathy. Patient and family expressed agreement with this plan and discussion regarding ac will be revisited pending improvement in her encephalopathy.     #norovirus: diarrhea resolved. Dc contact precautions  --encourage oral intake     #DM2: not well controlled   --cont lantus 28units and 12units lispro qac per endocrine recs  --add insulin coverage for glucerna supplements   --high intensity SSI     #Osteoporosis  --Continue home calcium and vitamin D    #GERD:   --Continue pepcid 20 mg daily    # moderate malnutrition: cont glucerna.  appreciate  nutrition recs.     VTE PPx: SQ heparin   CODE : Full Code  DISPO: Pending clinical improvement.

## 2014-10-09 NOTE — Plan of Care (Deleted)
Problem: Discharge Planning  Goal: Participation in care planning  Outcome: Not Met  No discharge orders noted at this time. Patient is capable of participating with POC. Family member present at bedside at this time. Report received from [place], day RN. Patient POC assumed. Will continue to provide supportive care to patient.

## 2014-10-09 NOTE — Plan of Care (Signed)
Problem: Discharge Planning  Goal: Participation in care planning  Outcome: Not Met  No discharge orders noted at this time. Patient is capable of participating with POC. Family member present at bedside at this time. Report received from [place], day RN. Patient POC assumed. Will continue to provide supportive care to patient.

## 2014-10-09 NOTE — Plan of Care (Signed)
Problem: Falls, Risk of  Goal: Keep patient free from falls utilizing universal fall precautions  Outcome: Met  Patient was assesed to be a high fall risk. Protocol implemented including bed at lowest position, brakes in placed, bed alarm activated, call light within reach, and side rails up x3. Family member at bedside. Patient remain free from fall and/or injury this shift. Will continue to monitor.    Problem: Tissue Perfusion, Cardiopulmonary - Altered  Goal: Hemodynamic stability  Outcome: Met  No changes from previous assessments. VSS; pt afebrile. See VS flowsheet for details. Will continue to monitor.        Problem: Pain - Acute  Goal: Communication of presence of pain  Outcome: Met  No verbal and/or non-verbal; cues for pain observed. Patient have ben resting and/or sleeping comfortably. Will continue to monitor.        Problem: Alteration in Blood Glucose  Goal: Glucose level within specified parameters  Outcome: Met  FSBS being monitored as ordered. Current reading was 159.    Problem: Infection  Goal: Absence of infection signs and symptoms  Outcome: Met  No S/S of infection observed. Patient remain afebrile. Will continue to monitor.        Problem: Skin Integrity- Intact patient high risk for impaired skin integrity Braden scale less than or equal to 18  Goal: Skin remains intact  Outcome: Met  Braden score this shift is 16. Left arm skin tear per report secondary to d/c'd PIV. Foam dressing remain clean, dry, and intact. Patient have been repositioned Q2H and/or PRN per comfort level. Patient is already on air mattress. No other major skin issues observed otherwise. Will continue to monitor.    Problem: Aspiration, Risk of  Goal: Evidence of swallowing difficulties is absent  Outcome: Met  Patient tolerated PO meds and thin liquid. HOB maintained at high fowler's positron during and after. No S/S of aspiration observed. Will monitor.    Problem: Breathing Pattern - Ineffective  Goal: Respiratory  rate, rhythm and depth return to baseline  Outcome: Met  Patient remain stable and in no acute respiratory distress observed. Patient still not coughing out anything. O2 protocol ordered. Will continue to monitor patient.

## 2014-10-09 NOTE — Plan of Care (Signed)
Problem: Breathing Pattern - Ineffective  Goal: Respiratory rate, rhythm and depth return to baseline  Outcome: Met  No changes from previous assessments. VSS. Patient remain stable and in no acute respiratory distress observed. Report given to [place], day RN. Patient POC reviewed and endorsed. AM labs endorsed to phlebotomist.

## 2014-10-09 NOTE — Interdisciplinary (Signed)
Attempted to see patient for therapy.  Per RN, patient having respiratory issues, but request to check with family.  Family and patient requesting PT to hold therapy today.  PT to follow up at a later time as scheduling permits. Please see last note for latest recommendations.  Thank you!

## 2014-10-09 NOTE — Interdisciplinary (Signed)
Patient sent to Interventional Radiology for thoracentesis. Pre-op checklist completed. Consent found in chart. Patient still complains of shortness of breath. Family at bedside and aware about procedure.

## 2014-10-09 NOTE — Progress Notes (Signed)
Hepatology Progress Note  Consult Attending: Dr. Andreas Ohm, MD    24h events:      - pt found dyspneic, lasix 40 mg iv x1 was given. CXR was performed    Past Medical History:  Past Medical History   Diagnosis Date    Migraine headache     Glaucoma     Ascites 06/29/2007     History of paracentesis x 2    Pancreatic cyst 06/29/2007    Cirrhosis ([place]) 06/24/2007     Active on the liver transplant waiting list with a blood type of O positive. 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 Due to Hep C    Osteopenia 06/24/2007    Emphysema 06/24/2007    EV (esophageal varices) ([place]) 06/10/2007    Anemia 06/10/2007    HCV (hepatitis C virus) 06/10/2007     Achieved SVR.    Type II or unspecified type diabetes mellitus with peripheral circulatory disorders, not stated as uncontrolled(250.70) 06/10/2007    HTN 06/10/2007    HTN 06/10/2007       Past Surgical History:   Past Surgical History   Procedure Laterality Date    Pb laps surg cholecstc w/expl common duct  1970s     in [place];  open CCX    Pb cesarean delivery only      Pb cstoplasty/cstourtp plstc any       Bladder suspension    Egd procedure  06/01/2013     Procedure: GI EGD;  Surgeon: Jeanette Caprice, MD;  Location: [facility];  Service: Gastroenterology;;    Tips procedure         Allergies:  No Known Allergies    Home Medications:  Prescriptions prior to admission   Medication Sig Dispense Refill Last Dose    alendronate (FOSAMAX) 70 MG tablet    09/22/2014    ALPHAGAN P 0.1 % ophthalmic solution    09/22/2014    bacitracin 500 UNIT/GM ointment Apply small amount to affected area on face twice daily 1 Tube 1 09/22/2014    CALCIUM CARBONATE 600 MG OR TABS 1 tablet 2 times daily one month 11 09/22/2014    ciprofloxacin (CIPRO) 500 MG tablet Take 1 tablet by mouth daily. 30 tablet 5 09/22/2014    furosemide (LASIX) 20 MG tablet Take 2 tablets by mouth daily. 90 tablet 0 09/22/2014    glipiZIDE (GLUCOTROL) 5 MG tablet Take 0.5 tablets (2.5 mg)  by mouth 2 times daily. 30 tablet 1 09/22/2014    insulin glargine (LANTUS) 100 UNIT/ML injection Inject 20 Units under the skin daily. 10 mL 1 09/22/2014    lactulose 10 GM/15ML solution Take 30 mLs (20 g) by mouth 3 times daily. 946 mL 0 09/22/2014    LUMIGAN 0.01 %    09/22/2014    multivitamin (MULTI-VITAMIN) tablet Take 1 tablet by mouth daily.   09/22/2014    ranitidine (ZANTAC) 300 MG tablet Take 300 mg by mouth 2 times daily.   09/22/2014    rifaximin (XIFAXAN) 550 MG tablet Take 550 mg by mouth every 12 hours.   09/22/2014    sitagliptin (JANUVIA) 50 MG tablet Take 1 tablet (50 mg) by mouth daily. 60 tablet 1 09/22/2014    spironolactone (ALDACTONE) 50 MG tablet Take 1 tablet (50 mg) by mouth daily. 30 tablet 0 09/22/2014    VITAMIN D 400 UNIT OR CAPS 1 CAPSULE DAILY 30 11 09/22/2014       Inpatient Medications:  Scheduled Meds  cholecalciferol  400 Units Daily    diltiazem  45 mg Q6H    famotidine  20 mg Daily    furosemide  20 mg Daily    insulin glargine  28 Units Daily    insulin lispro  1-10 Units 4x Daily WC    insulin lispro  12 Units TID AC    insulin lispro  8 Units TID WC    lactulose  20 g BID    multivitamin  5 mL Daily    rifaximin  550 mg Q12H    sodium chloride (PF)  3 mL Q8H    spironolactone  100 mg Daily     IV Meds   sodium chloride       OBJECTIVE:   Vitals signs:   Latest Entry  Range (last 24 hours)    Temperature: 97.8 F (36.6 C)  Temp  Avg: 97.8 F (36.6 C)  Min: 97.7 F (36.5 C)  Max: 98.2 F (36.8 C)    Blood pressure (BP): 141/52 mmHg  BP  Min: 111/56  Max: 145/51    Heart Rate: 102  Pulse  Avg: 93.1  Min: 85  Max: 102    Respirations: 19  Resp  Avg: 20.1  Min: 15  Max: 24    SpO2: 100 %  SpO2  Avg: 96.3 %  Min: 93 %  Max: 100 %       No Data Recorded     Weight - scale: 51.2 kg (112 lb 14 oz)  Percentage Weight Change (%): 7.11 %    Intake/Output       10/08/14 0600 - 09/18/2014 0559 10/05/2014 0600 - 10/10/14 0559      9811-9147 [id number] Total 0600-1759 0865-7846  Total       Intake    P.O.  400  -- 400  287  -- 287    I.V.  300  3 303  3  -- 3    Total Intake 700 3 703 290 -- 290       Output    Urine  --  -- --  225  -- 225    Total Output -- -- -- 225 -- 225       Net I/O     700 3 703 65 -- 65        Respiratory support:  Oxygen Therapy  SpO2: 100 %  O2 Device: None (Room air)  O2 Flow Rate (L/min): 2 l/min     Physical Exam:  General:  Well developed, thin elderly Hispanic female in no apparent distress  HEENT:  No scleral icterus, Trachea midline, mucus membranes moist.  Lungs:  Crackles bilaterally, appearing dyspneic  Cardiovascular:  Irregularly irregular  Abdomen:  Abdomen less tense, non tender to palpation, bowel sound present. No peritoneal signs.  Extremities:  Trace peripheral edema. Warm well-perfused.  Skin:  Non-icteric and no visible rash  Neuro/Psych:   Awake. No asterixis    Labs:     CBC  Recent Labs      10/08/14   0400  10/05/2014   0556   WBC  11.6*  12.4*   HGB  9.2*  9.5*   HCT  26.5*  28.3*   PLT  122*  138*   BAND  12   --    SEG  82*  87*   LYMPHS  3*  5*   MONOS  3*  8      Chemistry  Recent Labs  10/08/14   0400  09/13/2014   0556   NA  135*  133*   K  3.2*  4.0   CL  103  103   BICARB  17*  16*   BUN  23*  25*   CREAT  1.01*  1.03*   GLU  119*  94   [place]  8.0*  8.2*   MG  2.2  2.3   PHOS  2.6*  3.0     Recent Labs      10/08/14   0400  09/24/2014   0556   ALK  117  118   AST  26  23   ALT  16  16   TBILI  2.71*  2.87*   ALB  3.0*  3.1*        Coags  Recent Labs      10/08/14   0400  10/03/2014   0556   PT  19.2*  19.5*   INR  1.8  1.8       Radiology:   CT A/P w/ contrast 09/25/14: FINDINGS:  LUNG BASES: Small to moderate right pleural effusion with right lower lobe   atelectasis  LIVER/BILIARY:Diffuse nodularity and hepatic atrophy. Stable scattered tiny   low attenuation lesions in the liver.  PANCREAS: Stable numerous pancreatic cysts.  SPLEEN: Unremarkable.  ADRENAL GLANDS: Unremarkable  KIDNEYS: Stable left renal cyst.  STOMACH/DUODENUM: Mild  wall thickening from the distal stomach to the proximal  jejunum  VASCULATURE: Patent TIPS. Stable nonocclusive thrombus of the main portal vein  and superior mesenteric vein. New thrombosis of the left portal vein.  LYMPHATIC: Unremarkable  SMALL & LARGE BOWEL: Unremarkable  BLADDER/PELVIC ORGANS: Unremarkable  BONES/SOFT TISSUES: Unremarkable  OTHER: Trace pelvic ascites    IMPRESSION:  1) New thrombosis of the left portal vein. Vessel was patent on prior   abdominal ultrasound 09/28/2014  2) Edema of the distal stomach through proximal jejunum suggestive of   enteritis, nonspecific    CT head 09/24/2014: IMPRESSION:  1. No evidence of acute intracranial hemorrhage or findings to suggest   territorial/transcortical infarct.  2. Mild white matter changes. These may be associated with prior small vessel   ischemia. If there is concern for encephalopathy, MRI would be more helpful   for acute evaluation. MRI screening: No intracranial or intraorbital metal.  3. Frothy secretions within the right sphenoid sinuses well as reactive bone   changes, suggestive of acute on chronic sinusitis.    RUQ U/S 10/01/2014: FINDINGS:  The liver measures 11.3 cm in long axis. It is normal in echogenicity. No   focal lesions are identified. There is no intra or extra hepatic bile duct   dilation. The common bile duct measures four mm. The gallbladder is not   visualized. A patent TIPS catheter is present. The TIPS velocities are: Portal  end 115 ; mid 93 ; hepatic 125 cm/s. Flow in the right and left portal veins   is hepatofugal and towards the TIPS ; flow in the main portal vein is   hepatopetal.  The right kidney measures nine cm in long axis and is within normal limits   with no hydronephrosis or renal calculi.  The visualized portion of the pancreas again demonstrates multiple cystic   lesions throughout the pancreas, as seen on prior imaging  The visualized aorta and inferior vena cava are within normal limits.  Right pleural effusion.  No evidence of intra abdominal ascites.    IMPRESSION:  Patent TIPS catheter with normal velocities and appropriate flow in portal   veins.  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    cxr 10/05/14    Moderate right pleural effusion, increased from radiograph 09/21/2014.   Associated right basal atelectasis.  Increased prominent of the right heart. Dilated azygos arch representing   increased intravascular volume.  No other change.      Prior Endoscopy:   EGD 05/2013:  Findings: 1. Midline orophyarngeal prominence at the  arytenoid  2. Three columnns of grade 1 esophageal varices, no high  risk features  3. No gastric varices, mild portal hypertensive gastropathy  4. Few punctate clots in the antrum  5. Mild duodenal congestion consistent with portal  hypertension  Endoscopic Diagnosis: 1. Midline orophyarngeal  prominence at the arytenoid  2. Three columnns of grade 1 esophageal varices, no high  risk features  3. No gastric varices, mild portal hypertensive gastropathy  4. Few punctate clots in the antrum  5. 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 Continue propranolol 10 BID  2. ENT consultation to evaluate oropharyngeal findings  3. Repeat EGD in 1 year for varices screening        CT scan of the abdomen and pelvis without IV contrast 10/03/14    Compared to the prior CT abdomen pelvis of 09/25/2014, there is new gaseous   distention of the large bowel. No evidence of bowel obstruction, transition   point or pneumatosis intestinalis. There has been interval resolution of   proximal jejunal bowel wall thickening. No other significant changes compared   to the prior study.    Note that assessment of solid organs is limited in the absence of intravenous   Contrast.    CXR 10/05/14    IMPRESSION:  Moderate right pleural effusion, increased from radiograph 09/15/2014.   Associated right basal  atelectasis.  Increased prominent of the right heart. Dilated azygos arch representing   increased intravascular volume.  No other change.    cxr 10/03/2014      IMPRESSION:  Slightly faded but more extensive opacity in the periphery of the left mid   lung zone. Possible left perihilar opacities. Infection is considered in the   appropriate setting.    Improved left basal aeration.    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 Confusion associated with poor medication compliance, including lactulose, N/V, abd pain, diarrhea, and f/c/s. HDS. Work-up here showed GI pathogen panel with norovirus and recent UA consistent with UTI.     #AMS: Resolved. Likely precipitated by infection from UTI and norovirus infection.   -deferring previous plans to downsize TIPS given several infectious precipitants and TIPS downsizing will increase rate of pleural effusion accumulation.    # Sepsis/abdominal pain  Patient with no abdominal pain and bloating today. X ray found with colonic ileus, up 7.9 cm colon 09/13/2014. Improved leukocytosis. Thoracentesis with 1400 wbc/70% neutrophils, with repeat 1L thora on 2/26 with improved PMN count.    -  Dc vanco zosyn (02/23-7/28), given negative cultures, overall improvement  - cont supportive care  for norovirus  - ct non con:L gaseous distention, no transition point    #SOB  With increased pulmonary edema and right pleural effusion on the last CXR.  Patient improved after thoracentesis on 2/26 but now with inc dyspnea.    - icu eval if not improving  - IR repeat thoracentesis for today  - cont w diurese: Lasix 40 mg iv x1 with foley placement to measure UOP  - CXR: performed  today with worse findings  - prn nebs   - thoracentesis 10/05/2014 with 1400 wbc and 70% neutrophils, repeat 1Lthora on 2/16 w improved PMN count      #)Norovirus: likely responsible for initial N/V, abd pain and diarrhea on presentation  - Follow up blood cultures: NGTD  - Supportive care with IVF and anti-emetics as needed    #)Afib w rvr  S/p diltiazem, now rate controlled. CHADS score 2, chads vsc 4. High risk of fall due to her hepatic encephalopathy, highly concerned for the possibility of anticoagulation. We would hold anticoagulation at this time.  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 and TIPS revision 09/14/14 MELD 21    * HE: improved, likely related to infection and TIPS revision,    - Continue lactulose enemas, or po titrate for 3-4 soft bowel movements a day and rifaximin  * Ascites: lasix 20 mg po and spironolactone 100 mg po for increased sob and volume overload (extra iv lasix as needed)  * EV's: S/p TIPS, no longer needs surveillance if TIPS is patent and she is stable  * HHT: S/p TIPS and TIPS revision   - RUQ U/S 2/13 with patent TIPS  * Non-occlusive portal vein thrombosis: Multiple outpatient notes indicate poor medication adherence/knowledge, remains fall risk as well.  Risks outweigh benefits  * [place] surveillance: Last CT on 06/27/2014 showed no HCC. Repeat imaging in May 2016.  * Transplant: Age >53, not a candidate    DWA Dr. Carollee Herter, MD  GI Fellow

## 2014-10-10 DIAGNOSIS — R188 Other ascites: Secondary | ICD-10-CM

## 2014-10-10 LAB — CBC WITH DIFF, BLOOD
ANC-Automated: 12.1 10*3/uL — ABNORMAL HIGH (ref 1.6–7.0)
Abs Lymphs: 0.4 10*3/uL — ABNORMAL LOW (ref 0.8–3.1)
Abs Monos: 0.9 10*3/uL — ABNORMAL HIGH (ref 0.2–0.8)
Hct: 28.2 % — ABNORMAL LOW (ref 34.0–45.0)
Hgb: 9.6 gm/dL — ABNORMAL LOW (ref 11.2–15.7)
Imm Gran %: 1 % (ref <1)
Imm Gran Abs: 0.1 10*3/uL (ref <0.1)
Lymphocytes: 3 % — ABNORMAL LOW (ref 19–53)
MCH: 33.7 pg — ABNORMAL HIGH (ref 26.0–32.0)
MCHC: 34 % (ref 32.0–36.0)
MCV: 98.9 um3 — ABNORMAL HIGH (ref 79.0–95.0)
MPV: 9.6 fL (ref 9.4–12.4)
Monocytes: 6 % (ref 5–12)
Plt Count: 136 10*3/uL — ABNORMAL LOW (ref 140–370)
RBC: 2.85 10*6/uL — ABNORMAL LOW (ref 3.90–5.20)
RDW: 20.1 % — ABNORMAL HIGH (ref 12.0–14.0)
Segs: 90 % — ABNORMAL HIGH (ref 34–71)
WBC: 13.5 10*3/uL — ABNORMAL HIGH (ref 4.0–10.0)

## 2014-10-10 LAB — GLUCOSE (POCT)
Glucose (POCT): 227 mg/dL — ABNORMAL HIGH (ref 70–115)
Glucose (POCT): 265 mg/dL — ABNORMAL HIGH (ref 70–115)
Glucose (POCT): 329 mg/dL — ABNORMAL HIGH (ref 70–115)
Glucose (POCT): 251 mg/dL — ABNORMAL HIGH (ref 70–115)

## 2014-10-10 LAB — COMPREHENSIVE METABOLIC PANEL, BLOOD
ALT (SGPT): 16 U/L (ref 0–33)
AST (SGOT): 23 U/L (ref 0–32)
Albumin: 2.7 g/dL — ABNORMAL LOW (ref 3.5–5.2)
Alkaline Phos: 137 U/L (ref 35–140)
Anion Gap: 14 mmol/L (ref 7–15)
BUN: 30 mg/dL — ABNORMAL HIGH (ref 6–20)
Bicarbonate: 16 mmol/L — ABNORMAL LOW (ref 22–29)
Bilirubin, Tot: 2.95 mg/dL — ABNORMAL HIGH (ref <1.20)
Calcium: 8.2 mg/dL — ABNORMAL LOW (ref 8.5–10.6)
Chloride: 100 mmol/L (ref 98–107)
Creatinine: 1.04 mg/dL — ABNORMAL HIGH (ref 0.51–0.95)
GFR: 52 mL/min
Glucose: 220 mg/dL — ABNORMAL HIGH (ref 70–115)
Potassium: 4.2 mmol/L (ref 3.5–5.1)
Sodium: 130 mmol/L — ABNORMAL LOW (ref 136–145)
Total Protein: 6 g/dL (ref 6.0–8.0)

## 2014-10-10 LAB — PROTHROMBIN TIME, BLOOD
INR: 1.7
PT,Patient: 19 s — ABNORMAL HIGH (ref 9.7–12.5)

## 2014-10-10 LAB — PHOSPHORUS, BLOOD: Phosphorous: 3.8 mg/dL (ref 2.7–4.5)

## 2014-10-10 LAB — MAGNESIUM, BLOOD: Magnesium: 2.3 mg/dL (ref 1.7–2.6)

## 2014-10-10 MED ORDER — FUROSEMIDE 10 MG/ML IJ SOLN
20.0000 mg | Freq: Every day | INTRAMUSCULAR | Status: DC
Start: 2014-10-10 — End: 2014-10-11
  Administered 2014-10-10 – 2014-10-11 (×2): 20 mg via INTRAVENOUS
  Filled 2014-10-10 (×2): qty 4

## 2014-10-10 MED ORDER — TIOTROPIUM BROMIDE MONOHYDRATE 18 MCG IN CAPS
1.0000 | ORAL_CAPSULE | Freq: Every day | RESPIRATORY_TRACT | Status: DC
Start: 2014-10-10 — End: 2014-10-14
  Administered 2014-10-10 – 2014-10-12 (×3): 18 ug via RESPIRATORY_TRACT
  Filled 2014-10-10: qty 5

## 2014-10-10 MED ORDER — ALBUMIN HUMAN 25 % IV SOLN
25.0000 g | Freq: Two times a day (BID) | INTRAVENOUS | Status: DC
Start: 2014-10-10 — End: 2014-10-13
  Administered 2014-10-10 – 2014-10-13 (×7): 25 g via INTRAVENOUS
  Filled 2014-10-10 (×7): qty 100

## 2014-10-10 NOTE — Progress Notes (Signed)
Inpatient Medicine Progress Note       Interval Events/Subjective:   Thoracentesis completed in IR yesterday with 1.3L removed.  Pt said she did not feel better after procedure.   Cont to c/o sob throughout evening/am though no desaturations/stable oxygen requirement.  No f/c.    Vitals signs:     Latest Entry  Range (last 24 hours)    Temperature: 98 F (36.7 C)  Temp  Avg: 97.9 F (36.6 C)  Min: 97.7 F (36.5 C)  Max: 98 F (36.7 C)    Blood pressure (BP): 119/55 mmHg  BP  Min: 110/55  Max: 141/52    Heart Rate: 95  Pulse  Avg: 100.7  Min: 87  Max: 115    Respirations: 19  Resp  Avg: 20.7  Min: 17  Max: 26    SpO2: 100 %  SpO2  Avg: 97.9 %  Min: 95 %  Max: 100 %       No Data Recorded     Yesterday's intake and output:  02/29 0600 - 03/01 0559  In: 538 [P.O.:532; I.V.:6]  Out: 2100 [Urine:800]    Physical Exam  GENERAL:  Thin appearing woman in NAD.  Pleasant and cooperative.     HEENT: EOMI. Oropharynx appears moist. No LAD.   CARDIOVASCULAR:  Tachycardic and irregular rhythm.   LUNGS: CTA on L, absent bs on R, no wheezing  ABDOMEN: Soft, non-distended. Normoactive bowel sounds. Non-tender  EXTREMITIES: Warm and well perfused.   No asterixis     Labs seen and reviewed.       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     # acute respiratory failure: 2/2 increased pleural effusion. Had improvement after thoracentesis on 2/26 and 2/29.  Less likely new infection.  Still symptomatic, suspect component of anxiety.  Cont diuresis, f/u repeat cxr, nebs prn. Can try low dose ativan prn for dyspnea if not desaturating  Overall prognosis poor for pt and little options for improving hepatic hydrothorax.  Hepatology to meet with family today to discuss goals of care     #sepsis: suspect from UTI or infected pleural fluid (SBE) vs HCAP. Wbc rose on ctx but now  improved on vanc/zosyn. Still with leukocytosis but VS improved  - completed vanc zosyn x6d, stopped 2/28  - f/u pleural fluid cultures; resend pleural fluid for analysis with repeat thoracentesis -> fluid transudative, improved wbc  - f/u urine culture -> growing candida, consider treating if worsens    # colonic dilation: less likely 2/2 cdiff as negative studies.  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 Clinically improving    #AMS: multifactorial, contribution of HE + infection with norovirus and now possibly UTI. Improving.   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 Abdominal ultrasound on admission shows patency of vessels.   --Hepatic encephalopathy: continue lactulose/rifaximin, Improving. defer downsizing of TIPS given that downsizing will likely lead to recurrent re accumulating HHT.   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   --Ascites: lasix 20/aldactone 100 -> gave addl lasix 40 mg iv for dyspnea. S/p TIPS.    --HCC: CT 06/2014 without lesions c/f  [place], repeat due in May.     #Afib: Initially in RVR but HR now better controlled on oral diltiazem  -continue diltiazem, change to ER once dose determined   -Previous team had extensive discussion with patient and family regarding risk/benefits of anticoagulation and family wanted to pursue [facility] with xarelto however will defer beginning ac at this time given patient is at a high fall risk in setting of acute encephalopathy. Patient and family expressed agreement with this plan and discussion regarding ac will be revisited pending improvement in her encephalopathy.     #norovirus: diarrhea resolved. Dc contact precautions  --encourage oral intake     #DM2: not well controlled   --cont lantus 28units and 12units lispro qac per endocrine recs  --add  insulin coverage for glucerna supplements   --high intensity SSI     #Osteoporosis  --Continue home calcium and vitamin D    #GERD:   --Continue pepcid 20 mg daily    # moderate malnutrition: cont glucerna.  appreciate nutrition recs.     VTE PPx: SQ heparin   CODE : Full Code  DISPO: Pending clinical improvement and GOC discussions

## 2014-10-10 NOTE — Plan of Care (Signed)
36 Primary Team MD, Dr. Edgardo Roys was asked if the patient would benefit with albumin since her albumin levels were low and to help with her effusion as well.     Results for Paula Manning, Paula Manning (MRN [id number]) as of 10/10/2014 16:57   Ref. Range 10/03/2014 05:56 09/26/2014 13:40 09/15/2014 15:19 10/06/2014 16:15 10/10/2014 06:38   ALBUMIN Latest Ref Range: 3.5-5.2 g/dL 3.1 (L)    2.7 (L)     Patients nutritional consumption is still low and not at goal.

## 2014-10-10 NOTE — Progress Notes (Signed)
Hepatology Progress Note  Consult Attending: Dr. Andreas Ohm, MD    24h events:      - s/p thora with 1.3L removed after acutely dyspneic yesterday  - pt reports no improvement in SOB    Past Medical History:  Past Medical History   Diagnosis Date    Migraine headache     Glaucoma     Ascites 06/29/2007     History of paracentesis x 2    Pancreatic cyst 06/29/2007    Cirrhosis ([place]) 06/24/2007     Active on the liver transplant waiting list with a blood type of O positive. 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 Due to Hep C    Osteopenia 06/24/2007    Emphysema 06/24/2007    EV (esophageal varices) ([place]) 06/10/2007    Anemia 06/10/2007    HCV (hepatitis C virus) 06/10/2007     Achieved SVR.    Type II or unspecified type diabetes mellitus with peripheral circulatory disorders, not stated as uncontrolled(250.70) 06/10/2007    HTN 06/10/2007    HTN 06/10/2007       Past Surgical History:   Past Surgical History   Procedure Laterality Date    Pb laps surg cholecstc w/expl common duct  1970s     in [place];  open CCX    Pb cesarean delivery only      Pb cstoplasty/cstourtp plstc any       Bladder suspension    Egd procedure  06/01/2013     Procedure: GI EGD;  Surgeon: Jeanette Caprice, MD;  Location: [facility];  Service: Gastroenterology;;    Tips procedure         Allergies:  No Known Allergies    Home Medications:  Prescriptions prior to admission   Medication Sig Dispense Refill Last Dose    alendronate (FOSAMAX) 70 MG tablet    09/22/2014    ALPHAGAN P 0.1 % ophthalmic solution    09/22/2014    bacitracin 500 UNIT/GM ointment Apply small amount to affected area on face twice daily 1 Tube 1 09/22/2014    CALCIUM CARBONATE 600 MG OR TABS 1 tablet 2 times daily one month 11 09/22/2014    ciprofloxacin (CIPRO) 500 MG tablet Take 1 tablet by mouth daily. 30 tablet 5 09/22/2014    furosemide (LASIX) 20 MG tablet Take 2 tablets by mouth daily. 90 tablet 0 09/22/2014    glipiZIDE (GLUCOTROL) 5 MG  tablet Take 0.5 tablets (2.5 mg) by mouth 2 times daily. 30 tablet 1 09/22/2014    insulin glargine (LANTUS) 100 UNIT/ML injection Inject 20 Units under the skin daily. 10 mL 1 09/22/2014    lactulose 10 GM/15ML solution Take 30 mLs (20 g) by mouth 3 times daily. 946 mL 0 09/22/2014    LUMIGAN 0.01 %    09/22/2014    multivitamin (MULTI-VITAMIN) tablet Take 1 tablet by mouth daily.   09/22/2014    ranitidine (ZANTAC) 300 MG tablet Take 300 mg by mouth 2 times daily.   09/22/2014    rifaximin (XIFAXAN) 550 MG tablet Take 550 mg by mouth every 12 hours.   09/22/2014    sitagliptin (JANUVIA) 50 MG tablet Take 1 tablet (50 mg) by mouth daily. 60 tablet 1 09/22/2014    spironolactone (ALDACTONE) 50 MG tablet Take 1 tablet (50 mg) by mouth daily. 30 tablet 0 09/22/2014    VITAMIN D 400 UNIT OR CAPS 1 CAPSULE DAILY 30 11 09/22/2014       Inpatient Medications:  Scheduled Meds   albumin human  25 g BID    cholecalciferol  400 Units Daily    diltiazem  45 mg Q6H    famotidine  20 mg Daily    furosemide  20 mg Daily    insulin glargine  28 Units Daily    insulin lispro  1-10 Units 4x Daily WC    insulin lispro  12 Units TID AC    insulin lispro  8 Units TID WC    lactulose  20 g BID    multivitamin  5 mL Daily    rifaximin  550 mg Q12H    sodium chloride (PF)  3 mL Q8H    spironolactone  100 mg Daily    tiotropium  1 capsule Daily     IV Meds   sodium chloride       OBJECTIVE:   Vitals signs:   Latest Entry  Range (last 24 hours)    Temperature: 97.3 F (36.3 C)  Temp  Avg: 97.8 F (36.6 C)  Min: 97.3 F (36.3 C)  Max: 98 F (36.7 C)    Blood pressure (BP): 109/47 mmHg  BP  Min: 109/47  Max: 140/99    Heart Rate: 94  Pulse  Avg: 100.7  Min: 88  Max: 115    Respirations: 19  Resp  Avg: 20.4  Min: 17  Max: 26    SpO2: 100 %  SpO2  Avg: 98 %  Min: 95 %  Max: 100 %       No Data Recorded     Weight - scale: 51 kg (112 lb 7 oz)  Percentage Weight Change (%): -0.39 %    Intake/Output       09/11/2014 0600 -  10/10/14 0559 10/10/14 0600 - 10/11/14 0559      [id number] 2595-6387 Total 0600-1759 5643-3295 Total       Intake    P.O.  287  245 532  360  -- 360    I.V.  3  3 6  3   -- 3    Total Intake 290 248 538 363 -- 363       Output    Urine  450  350 800  --  -- --    Other  1300  -- 1300  --  -- --    Total Output 1750 350 2100 -- -- --       Net I/O     -1460 -102 -1562 363 -- 363        Respiratory support:  Oxygen Therapy  SpO2: 100 %  O2 Device: Nasal cannula  O2 Flow Rate (L/min): 3 l/min     Physical Exam:  General:  Well developed, thin elderly Hispanic female in no apparent distress  HEENT:  No scleral icterus, Trachea midline, mucus membranes moist.  Lungs:  Crackles bilaterally, appearing dyspneic  Cardiovascular:  Irregularly irregular  Abdomen:  Abdomen less tense, non tender to palpation, bowel sound present. No peritoneal signs.  Extremities:  Trace peripheral edema. Warm well-perfused.  Skin:  Non-icteric and no visible rash  Neuro/Psych:   Awake. No asterixis    Labs:     CBC  Recent Labs      10/08/14   0400  09/27/2014   0556  10/10/14   0638   WBC  11.6*  12.4*  13.5*   HGB  9.2*  9.5*  9.6*   HCT  26.5*  28.3*  28.2*   PLT  122*  138*  136*   BAND  12   --    --    SEG  82*  87*  90*   LYMPHS  3*  5*  3*   MONOS  3*  8  6      Chemistry  Recent Labs      09/24/2014   0556  10/10/14   0638   NA  133*  130*   K  4.0  4.2   CL  103  100   BICARB  16*  16*   BUN  25*  30*   CREAT  1.03*  1.04*   GLU  94  220*   [place]  8.2*  8.2*   MG  2.3  2.3   PHOS  3.0  3.8     Recent Labs      10/04/2014   0556  10/10/14   0638   ALK  118  137   AST  23  23   ALT  16  16   TBILI  2.87*  2.95*   ALB  3.1*  2.7*        Coags  Recent Labs      10/03/2014   0556  10/10/14   0638   PT  19.5*  19.0*   INR  1.8  1.7       Radiology:   CT A/P w/ contrast 09/25/14: FINDINGS:  LUNG BASES: Small to moderate right pleural effusion with right lower lobe   atelectasis  LIVER/BILIARY:Diffuse nodularity and hepatic atrophy. Stable scattered tiny    low attenuation lesions in the liver.  PANCREAS: Stable numerous pancreatic cysts.  SPLEEN: Unremarkable.  ADRENAL GLANDS: Unremarkable  KIDNEYS: Stable left renal cyst.  STOMACH/DUODENUM: Mild wall thickening from the distal stomach to the proximal  jejunum  VASCULATURE: Patent TIPS. Stable nonocclusive thrombus of the main portal vein  and superior mesenteric vein. New thrombosis of the left portal vein.  LYMPHATIC: Unremarkable  SMALL & LARGE BOWEL: Unremarkable  BLADDER/PELVIC ORGANS: Unremarkable  BONES/SOFT TISSUES: Unremarkable  OTHER: Trace pelvic ascites    IMPRESSION:  1) New thrombosis of the left portal vein. Vessel was patent on prior   abdominal ultrasound 09/15/2014  2) Edema of the distal stomach through proximal jejunum suggestive of   enteritis, nonspecific    CT head 10/05/2014: IMPRESSION:  1. No evidence of acute intracranial hemorrhage or findings to suggest   territorial/transcortical infarct.  2. Mild white matter changes. These may be associated with prior small vessel   ischemia. If there is concern for encephalopathy, MRI would be more helpful   for acute evaluation. MRI screening: No intracranial or intraorbital metal.  3. Frothy secretions within the right sphenoid sinuses well as reactive bone   changes, suggestive of acute on chronic sinusitis.    RUQ U/S 09/22/2014: FINDINGS:  The liver measures 11.3 cm in long axis. It is normal in echogenicity. No   focal lesions are identified. There is no intra or extra hepatic bile duct   dilation. The common bile duct measures four mm. The gallbladder is not   visualized. A patent TIPS catheter is present. The TIPS velocities are: Portal  end 115 ; mid 93 ; hepatic 125 cm/s. Flow in the right and left portal veins   is hepatofugal and towards the TIPS ; flow in the main portal vein is   hepatopetal.  The right kidney measures nine cm in long axis and is within normal  limits   with no hydronephrosis or renal calculi.  The visualized portion of the  pancreas again demonstrates multiple cystic   lesions throughout the pancreas, as seen on prior imaging  The visualized aorta and inferior vena cava are within normal limits.  Right pleural effusion. No evidence of intra abdominal ascites.    IMPRESSION:  Patent TIPS catheter with normal velocities and appropriate flow in portal   veins.  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    cxr 10/05/14    Moderate right pleural effusion, increased from radiograph 10/07/2014.   Associated right basal atelectasis.  Increased prominent of the right heart. Dilated azygos arch representing   increased intravascular volume.  No other change.      Prior Endoscopy:   EGD 05/2013:  Findings: 1. Midline orophyarngeal prominence at the  arytenoid  2. Three columnns of grade 1 esophageal varices, no high  risk features  3. No gastric varices, mild portal hypertensive gastropathy  4. Few punctate clots in the antrum  5. Mild duodenal congestion consistent with portal  hypertension  Endoscopic Diagnosis: 1. Midline orophyarngeal  prominence at the arytenoid  2. Three columnns of grade 1 esophageal varices, no high  risk features  3. No gastric varices, mild portal hypertensive gastropathy  4. Few punctate clots in the antrum  5. 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 Continue propranolol 10 BID  2. ENT consultation to evaluate oropharyngeal findings  3. Repeat EGD in 1 year for varices screening        CT scan of the abdomen and pelvis without IV contrast 10/03/14    Compared to the prior CT abdomen pelvis of 09/25/2014, there is new gaseous   distention of the large bowel. No evidence of bowel obstruction, transition   point or pneumatosis intestinalis. There has been interval resolution of   proximal jejunal bowel wall thickening. No other significant changes compared   to the prior study.    Note that assessment  of solid organs is limited in the absence of intravenous   Contrast.    CXR 10/05/14    IMPRESSION:  Moderate right pleural effusion, increased from radiograph 10/03/2014.   Associated right basal atelectasis.  Increased prominent of the right heart. Dilated azygos arch representing   increased intravascular volume.  No other change.    cxr 09/28/2014      IMPRESSION:  Slightly faded but more extensive opacity in the periphery of the left mid   lung zone. Possible left perihilar opacities. Infection is considered in the   appropriate setting.    Improved left basal aeration.    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 Confusion associated with poor medication compliance, including lactulose, N/V, abd pain, diarrhea, and f/c/s. HDS. Work-up here showed GI pathogen panel with norovirus and recent UA consistent with UTI.     #SOB: With increased pulmonary edema and right pleural effusion on the last CXR.  Minimal subjective improvement after thora, but satting 100 on 3L and non-labored breathing.  Does have prior h/o COPD, but ABG without  signs of retention.     - cont w diurese Lasix 20 mg IV daily + spironolactone, strict I/O's, add IV albumin 25% BID until serum albumin > 3  - wean O2 as tolerated  - minimal emphysematous changes seen on CT, can start advair/spiriva and encourage PRN albuterol use  - repeat CXR with changes in resp status    #AMS: Resolved. Likely precipitated by infection from UTI and norovirus infection.   -deferring previous plans to downsize TIPS given several infectious precipitants and TIPS downsizing will increase rate of pleural effusion accumulation.    # Sepsis/abdominal  pain Patient with no abdominal pain and bloating today. X ray found with colonic ileus, up 7.9 cm colon 10/03/2014. Improved leukocytosis. Thoracentesis with 1400 wbc/70% neutrophils, with repeat 1L thora on 2/26 with improved PMN count.    - DC vanco zosyn (02/23-2/28), given negative cultures, overall improvement  - cont supportive care for norovirus  - ct non con:L gaseous distention, no transition point    # HCV GT2 cirrhosis c/b ascites, HE and diuretic resistant HHT now s/p TIPS 06/28/2014 and TIPS revision 09/14/14 MELD 17/MELD-Na 22    -poor nutritional status and overall poor prognosis outside of acute setting  -family discussion regarding goals of care today  -begin calorie counts, may require feeding tube if not taking in adequate PO    * HE: Continue lactulose, titrate for 3-4 soft bowel movements a day and rifaximin  * Ascites: lasix IV 67m and spironolactone 100 mg po  * EV's: S/p TIPS, no longer needs surveillance if TIPS is patent and she is stable  * HHT: S/p TIPS and TIPS revision   - RUQ U/S 2/16 with patent TIPS  * Non-occlusive portal vein thrombosis: Multiple outpatient notes indicate poor medication adherence/knowledge, remains fall risk as well.  Risks outweigh benefits  * HEast [place]urveillance: Last CT on 06/27/2014 showed no HCC. Repeat imaging in May 2016.  * Transplant: Age >70, not a candidate    # Norovirus: likely responsible for initial N/V, abd pain and diarrhea on presentation  - Follow up blood cultures: NGTD  - Supportive care with IVF and anti-emetics as needed    # Afib w rvr: S/p diltiazem, now rate controlled. CHADS score 2, chads vsc 4. High risk of fall due to her hepatic encephalopathy, highly concerned for the possibility of anticoagulation. We would hold anticoagulation at this time.  - hold anticoagulation  - cont rate control    DWA Dr. KSilverio Decamp MD  [facility]

## 2014-10-10 NOTE — Plan of Care (Signed)
Problem: Discharge Planning  Goal: Participation in care planning  Outcome: Met  Patient is able to participate with her care,eg,reposition. She is able to verbalize her wishes with regard to her care.    Problem: Falls, Risk of  Goal: Keep patient free from falls utilizing universal fall precautions  Outcome: Met  No falls noted or reported.    Problem: Tissue Perfusion, Cardiopulmonary - Altered  Goal: Hemodynamic stability  Outcome: Not Met  Patient has afib    Problem: Pain - Acute  Goal: Communication of presence of pain  Outcome: Met  Patient denies any pain.    Problem: Alteration in Blood Glucose  Goal: Glucose level within specified parameters  Outcome: Not Met  Patient has elevated blood sugar.    Problem: Infection  Goal: Absence of infection signs and symptoms  Outcome: Met  Patient is not exhibiting any signs of infection.    Problem: Skin Integrity- Intact patient high risk for impaired skin integrity Braden scale less than or equal to 18  Goal: Skin remains intact  Outcome: Not Met  Patient has multiple skin tears in her arms but no impaired skin integrity on bony areas.    Problem: Aspiration, Risk of  Goal: Evidence of swallowing difficulties is absent  Outcome: Met  Patient is not exhibiting any signs of aspiration. Swallow study not done today due to patients procedure.    Problem: Breathing Pattern - Ineffective  Goal: Respiratory rate, rhythm and depth return to baseline  Outcome: Not Met  Patients respiratory status is unchanged. Patient is still complaining of feeling not enough oxygen, MD's are aware.

## 2014-10-10 NOTE — Interdisciplinary (Signed)
OT Daily Treatment Note    Subjective:  RLE: No Precautions  LLE: No Precautions  RUE: No Precautions  LUE: No Precautions  Spine orthotic: None  Additional  Precautions/Contrandications:: Fall Risk    Pain Score (0-10): 0  Pain Location: No c/o pain    Status: Agreeable to treatment;RN ok'd treatment    Treatment Today  Therapeutic Activities [97530]: x2              Objective:  Objective Findings: Pt received in supine, agreeable to therapy, family members present.  Pt on 2.5 L O2, sats at rest ~97%.  Pt mobilized to EOB with Mod A, able to use bedrails to assist with side-lying.  At EOB, pt demo fair static sitting balance, participated in PLB x5 with visual cues.  Pt able to increase sats from low 90's to ~99%.  Pt sit>stand with Mod A and FWW, required v/c's for LE positioning and tactile cues for safety.  Pt sat in chair to participate in facial hygiene, SBA, and BUE HEP in x3 planes.  Pt with dec ROM L shoulder FF 2/2 PMH fx, all other joints WFL's.  Pt participated in PLB x10 breaths, educated on completing breathing exercises to dec SOB and improve O2 sats, pt's O2 with PLB ~98%.  Pt left with needs in reach, nursing updated and family present.  BP in sitting ~107/47.    Assessment:  Assessment: Pt demo improved activity tolerance to transfer to chair with FWW, Mod A.  Pt able to improve O2 sats high 90's with PLB.  Pt completed BUE HEP with visual cues, educated on participating in HEP 3x's daily, pt verbalized understanding and agreement.  Pt continues to benefit from skilled IP OT to Max I in ADL's, BUE strengthening and safety.   Equipment Recommendations: to be determined as patient progresses with therapy    Plan:  Continue therapy for Diagnosis: Difficulty in walking (dec activity tolerance ) 719.7  Frequency: 4-7 times/wk  Focus for Next Treatment: Graded tasks to increase function;Patient exercise program instruction;Functional mobility training for ADLs;ADL training ;Safety instruction    Total  Treatment Time (min): 30  Total Timed Treatment (min) : 30

## 2014-10-10 NOTE — Plan of Care (Signed)
Problem: Discharge Planning  Goal: Participation in care planning  No orders for d/c at this time. Will continue to keep pt informed and updated regarding changes to plan of care and treatment.         Problem: Falls, Risk of  Goal: Keep patient free from falls utilizing universal fall precautions  Outcome: Met  Pt's family members at bedside, instructed regarding fall precautions, verbalized understanding. Fall precautions in place. Bed in lowest and locked position, side rails upx3, bed alarm on, call light within reach. Will monitor.          Problem: Tissue Perfusion, Cardiopulmonary - Altered  Goal: Hemodynamic stability  Pt remains afib on cardiac monitors. Scheduled dose of diltiazem 40m PO given. Will monitor.    Problem: Pain - Acute  Goal: Communication of presence of pain  Outcome: Met  Pain levels assessed, pt stated that pain is not present at this time. Pt educated and instructed regarding pain management. Pt also instructed to report to nurse increased levels of pain. Pt verbalized understanding to instructions. Will monitor and medicate for pain prn.        Problem: Alteration in Blood Glucose  Goal: Glucose level within specified parameters  Outcome: Not Met  Fingerstick this evening=213. One unit of lispro given. Will monitor for s/sx's of hypo/hyperglycemia.        Problem: Infection  Goal: Absence of infection signs and symptoms  Outcome: Met  Pt afebrile, HR within normal limits. Scheduled dose of antibiotics given. Will monitor for s/sx's of infection and report changes to MD.        Problem: Aspiration, Risk of  Goal: Evidence of swallowing difficulties is absent  Outcome: Met  Pt HOB elevated when pt consuming meals and fluids. No cough present.     Problem: Breathing Pattern - Ineffective  Goal: Respiratory rate, rhythm and depth return to baseline  Per pt's family request, O2 increased from 2L to 3L via NC. No acute distress noted but sats in the mid to high 90s. Will monitor.

## 2014-10-10 NOTE — Interdisciplinary (Signed)
Attempted to see pt this pm, however, pt having procedure done in room. RN covering reports family conference today. ST to f/u when pt able to participate.

## 2014-10-10 NOTE — Interdisciplinary (Signed)
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 Confusion associated with poor medication compliance, including lactulose, N/V, abd pain, diarrhea, and f/c/s, now attributed to norovirus. Pt w/ recent UA consistent with UTI.   Interval Events:AMS resolved. No abd pain or bloating today. S/p thora -1.3 L(3/1), -1L(2/26), -1 L(2/22).   HE improved (continue lactulose enemas), diuresing for ascites. MELD 17/MELD-Na 22 . Not tx candidate for age,poor prognosis, team will have family discussion re [place] with family today.     F/u wt (61"): 112# today @ 106% IBW of 105 lbs. Stable wt range since admit: 111-117#. Given pt w/ESLD, fluids status and pt s/p thora, will continue basing needs on previous low wt since admit on 2/18 _0  lbs (50.5 kg,106% IBW of 105 lbs. BMI: 21). Will continue to monitor trends. Noted per EPIC review, pt ~112# x3 months ago (no loss), and 118# x6 months ago (pt reports UBW of 118#). For additional wt Hx, see 2/16 note.   Diet Rx: Pureed, Carb Limited Diet + Glucerna TID   GI: LBM 3/1 x1 so far (loose, brown, large). Noted stool ranging from 0-5x/d x 4 days (1,4,0,5).   Abd: tense, NT, BS+, no peritoneal signs per MD note 2/29. On pureed, thin liquids d/t mild/moderate dysphagia per SLP eval 2/25.   PO intake: Pt with continued poor PO intake. Percentage of plate items taken has decreased since last F/U, but total calorie intake has increased with the addition of Glucerna.   Past 4 days: 40% x 7 meals plus ~75-100% of 7 Glucerna. Avg: 1106kcal (77% low end needs) + 60g pro/d (98% low end needs). Noted at visit pt had only taken two bites of her meal and had quite a few  unopened apple sauces and puddings at bedside.  Daughter [name] translated today: pt no longer interested in vanilla pudding with meals, but still open to applesauce (applesauce and soup are the only foods she's consistently eating). Pt has "so so" appetite that has been consistent since PTA, however pt refuses to eat pureed hospital foods. Family reports that RN instructed them last week not to bring food from outside d/t pt w/norovirus. Pt not open to increasing Glucerna to QID, but agreeable to continuing TID. Pt denied most snack offers, but was agreeable to greek yogurt and jello w/meals. Pt w/difficulty chewing d/t missing teeth and dentures but was reportedly consuming a Mech Soft diet at home (2 meals/d, lots of soups and vegetables, low fat).   I/O: 418m /18728m(-1387103m net +16L since admit (adequacy deferred to MD).   Labs: Na 130 (trend down), BUN 30/Cr1.04 (both trend up), GFR 52 (trend down), Glu 220, POCBS 227-265, [place] 9.24, T Bili 2.95 trends up (ALT/AST WNL), K/Phos/Mg WNL, WBC 13.5 trends up, Vit D(25-OH) 22 (06/2014), Vit A 0.22(06/2014), quantitative Zn 643/WNL (06/2014), A1C 6.2 (2/13)  Meds: human albumin BID, cholecalciferol 400 units daily, pepcid, lasix 52m46mily, lantus 28 units daily (down from 35 units at last f/u), Lispro SSI QID, Lispro 12 units TID, Lispro 8 units TID, lactulose 20g BID, MVT, compazine prn, rifaximin, mylicon prn, aldactone, ultram prn  Skin: dry, warm, ecchymosis. skin tear to L arm per RN head  to toe assessment 3/1  Edema: Trace peripheral edema per MD note 2/29. Pitting +1 to BLE per RN head to toe assessment 3/1.   NFPE 3/1: (visual, limited) agree with most recent RD observations.   RD 2/23: Severe fat loss to orbital and thorasic region, moderate to severe fat loss to upper arm region. Severe muscle loss to temple area, clavicles are protruded, moderate to severe loss to acromion region, moderate loss to quadriceps, patellar region and gastrocnemius muscle.  Poor nail perfusion.   Nutrition-Focused Physical Exam 2/16: (visual) Subcutaneous Fat Loss: Orbital Region: Severe Muscle Loss: Temporalis: moderate, Quadricepts: moderate, Patellar Region: moderate Posterior Calf Region: mild    Estimated needs per [place](50.5kg):952 kcal x 1.5-1.7= 1428-1618 kcal/day (28-32 kcal/kg). 61-76g protein (1.2-1.5g protein/kg) Fluids deferred to MD d/t diuresis    D: Severe malnutrition r/t chronic catabolic illness AEB pt w/ESLD, ~18.8% wt loss x 1.5 years and severe fat and muscle loss noted in physical exam findings.      I: GOAL(2/23):Pt to meet >75% of estimated needs with acceptable tolerance by next f/u (calorie met (74%), not proteins (73%).   GOAL(2/26):Pt to meet >75% of estimated needs with acceptable tolerance by next f/u (met, 77% of calorie needs, 99% of protein needs)  GOAL (3/1):  Pt to meet 100% of low end estimated needs with acceptable tolerance     Plan/Recs:   1-Rec to continue monitoring lytes and replacing as needed.   2- Rec add 2gm Na restriction to current Pureed Carb Limited Diet order  3- Rec consult to SLP for possible diet advancement d/t pt refusing pureed hospital foods. Family instructed to bring in cooked soups from home to encourage po intake.   4-Rec continue (vanilla) Glucerna/Boost Glucose Control TID   5-Rec adjust insulin per endocrine with goal of POCBS <159m/dL  6- Highly rec to increase cholecalciferol to 5000 units daily and to add minerals to MVT  7-Highly rec to check whole blood Cu and Se  8- If warranted per GOC, highly rec to place post pyloric feeding tube to start noct TF. Consult unit RD for recs if placed.   9-Rec to continue to weigh daily to monitor trends     Nutrition POC(coordination of care): [facility] [phone] Education: Emphasized importance of family encouraging PO/supplement intake whenever possible.   Discharge: pending clinical course   RD to monitor/evaluate PO intake/dequacy, GI condition, labs, wt trends, skin  condition, and will f/u per policy    JLoma Sousa Dietetic Intern    Reviewed and edited above note, agree with current POC  Will continue to f/u per policy  ARochele Pages MS, RD pager # [phone]

## 2014-10-10 NOTE — Interdisciplinary (Signed)
PT Daily Treatment Note    Discharge Information:     PHYSICAL THERAPY DISCHARGE RECOMMENDATIONS  Discharge Physical Therapy and equipment needs: Patient will benefit from continued skilled Physical Therapy  Patient is appropriate for discharge to: a supervised living situation  Patient has the following deficits that may restrict safe completion of mobility activities of daily living skills: physical limitations  Patient functional limitations necessitate consideration of the following assistive device to complete mobility activities of daily living safely: [place]    Subjective:  RLE: No Precautions  LLE: No Precautions  RUE: No Precautions  LUE: No Precautions  Spine orthotic: None  Additional  Precautions/Contrandications:: Fall Risk       Pain Location: Patient did not report pain today.     Status: Agreeable to treatment;RN ok'd treatment     Treatment Today  Therapeutic Activities [97530]: x1  Therex [97110]: x1                                                                                                                           Objective:   Objective Findings: Patient received reclined in bed with family at bedside. Air mattress deflated prior to activity. Supine -> sit at EOB with mod Ax2. Good static sitting balance at EOB, able to use BUEs on bed for support. Sit -> stand with FWW, mod A due to retropulsion, required constant manual support in standing due to retropulsion. Transfer to bedside chair with stand step using FWW, mod Ax2, cues for hand placement and assist with managing FWW. Instructed on therex while in bedside chair including LAQS 1x10, knee extension + APs 1x10, and seated marches x20. Pt with fatigue completing exercises. Pt instructed to remain sitting in bedside chair for 30 minutes or as tolerated. Pt left in bedside chair, family present, RN notified, call light within reach, all needs met.     Assessment:  Assessment: Patient continues to have posterior lean when  standing and requires constant manual assist to maintain midline. Able to transfer to bedside chair today and complete therex but with significant decreased activity tolerance. Required cues for deep breathing and lung expansion. Plan to continue progressing OOB activities and training midline awareness to decrease posterior lean when standing and ambulating. Pt would benefit from ongoing skilled PT to train activity tolerance, balance, mobility strategies, and safety awareness to maximize functional ability.        Plan:    PHYSICAL THERAPY PATIENT DISCHARGE INSTRUCTIONS  Your Physical Therapist suggests the following: Continue to complete your home exercise program daily as instructed;Continue to utilize correct body mechanics when moving in and out of bed as instructed;Supervision with walking is suggested for increased safety;Continue to use your assistive device as instructed when walking to improve your stability and prevent falls  The following assistive device has been recommended for your safety: [place] therapy for Diagnosis: Difficulty in walking (dec activity tolerance ) 719.7  Focus  for Next Treatment: Gait training;Functional mobility training;Balance activities;Graded tasks to increase function;Safety instruction;Therapeutic exercise  Frequency: 4-7 times/wk    Total Treatment Time (min): 30  Total Timed Treatment (min) : 30

## 2014-10-10 NOTE — Progress Notes (Signed)
Endocrine/Diabetes Progress Note    Overnight:  No acute events    Subjective: pt resting comfortably, but reports ongoing SOB    Objective:   Temperature:  [97.3 F (36.3 C)-98 F (36.7 C)] 97.6 F (36.4 C) (03/01 1600)  Blood pressure (BP): (109-140)/(45-99) 120/48 mmHg (03/01 1600)  Heart Rate:  [85-106] 86 (03/01 1600)  Respirations:  [17-22] 19 (03/01 1600)  Pain Score:  [-] 0 (03/01 1600)  O2 Device:  [-] Nasal cannula (03/01 1600)  O2 Flow Rate (L/min):  [2 l/min-3 l/min] 3 l/min (03/01 1600)  SpO2:  [95 %-100 %] 98 % (03/01 1600)  Body mass index is 21.26 kg/(m^2).    GEN: NAD, chronically ill appearing  PULM: breathing comfortably    Diet: pureed carb limited + glucerna TID    Blood Sugars reviewed:        AM  Lu  Di  HS  O/N    2/28  114 162 180 159  Lantus   35  Lispro  8+6 8+3 6+3    2/29  88 113 231 213  Lantus   28  Lispro  12+8  4+6 +1    3/1  227   Lantus  Lispro  8+6     A/P:  [name] is a 74 year old female with uncontrolled DM2 c/b neuropathy as well as HepC cirrhosis, HTN, glaucoma and migraines admitted 09/26/2014 w/ n/v and AMS.     BG high since decreasing Lantus dose yesterday, will monitor overnight before adjusting further.    Inpatient Recs:  - cont lantus  28 units qday  - cont Lispro 12 units qac meals (RN to give 0, 1/2, whole dose based on % tray consumed)  - cont Lispro 8 units per can of Glucerna once diet advances (RN to give 0, 1/2, whole dose based on % tray consumed)  - continue high Lispro correction scale q ac/hs    Outpatient Recs:  Was on Lantus 20 units qhs, Januvia 50 mg qday and glipizide 2.5 mg bid w/ A1C 6.2%, but home BG 200-400s in setting of OJ-carrot juice and ++rice/beans intake. Final doses TBD based on inpatient needs.    will cont to follow

## 2014-10-10 DEATH — deceased

## 2014-10-11 DIAGNOSIS — K6389 Other specified diseases of intestine: Secondary | ICD-10-CM

## 2014-10-11 DIAGNOSIS — K862 Cyst of pancreas: Secondary | ICD-10-CM

## 2014-10-11 DIAGNOSIS — J189 Pneumonia, unspecified organism: Secondary | ICD-10-CM

## 2014-10-11 LAB — PROTHROMBIN TIME, BLOOD
INR: 1.8
PT,Patient: 20.2 s — ABNORMAL HIGH (ref 9.7–12.5)

## 2014-10-11 LAB — URINALYSIS
Bilirubin: NEGATIVE
Glucose: NEGATIVE
Hyaline Cast: >5 — AB (ref 0–?)
Ketones: NEGATIVE
Nitrite: NEGATIVE
Specific Gravity: 1.026 (ref 1.002–1.030)
Urobilinogen: NEGATIVE
pH: 5 (ref 5.0–8.0)

## 2014-10-11 LAB — PHOSPHORUS, BLOOD: Phosphorous: 5.2 mg/dL — ABNORMAL HIGH (ref 2.7–4.5)

## 2014-10-11 LAB — COMPREHENSIVE METABOLIC PANEL, BLOOD
ALT (SGPT): 16 U/L (ref 0–33)
AST (SGOT): 20 U/L (ref 0–32)
Albumin: 3.8 g/dL (ref 3.5–5.2)
Alkaline Phos: 118 U/L (ref 35–140)
Anion Gap: 22 mmol/L — ABNORMAL HIGH (ref 7–15)
BUN: 36 mg/dL — ABNORMAL HIGH (ref 6–20)
Bicarbonate: 12 mmol/L — ABNORMAL LOW (ref 22–29)
Bilirubin, Tot: 3.19 mg/dL — ABNORMAL HIGH (ref <1.20)
Calcium: 8.8 mg/dL (ref 8.5–10.6)
Chloride: 97 mmol/L — ABNORMAL LOW (ref 98–107)
Creatinine: 1.44 mg/dL — ABNORMAL HIGH (ref 0.51–0.95)
GFR: 36 mL/min
Glucose: 256 mg/dL — ABNORMAL HIGH (ref 70–115)
Potassium: 3.9 mmol/L (ref 3.5–5.1)
Sodium: 131 mmol/L — ABNORMAL LOW (ref 136–145)
Total Protein: 6.5 g/dL (ref 6.0–8.0)

## 2014-10-11 LAB — CBC WITH DIFF, BLOOD
ANC-Automated: 13.4 10*3/uL — ABNORMAL HIGH (ref 1.6–7.0)
Abs Lymphs: 0.2 10*3/uL — ABNORMAL LOW (ref 0.8–3.1)
Abs Monos: 0.9 10*3/uL — ABNORMAL HIGH (ref 0.2–0.8)
Hct: 25.5 % — ABNORMAL LOW (ref 34.0–45.0)
Hgb: 8.7 gm/dL — ABNORMAL LOW (ref 11.2–15.7)
Imm Gran %: 1 % (ref <1)
Imm Gran Abs: 0.1 10*3/uL (ref <0.1)
Lymphocytes: 2 % — ABNORMAL LOW (ref 19–53)
MCH: 34.1 pg — ABNORMAL HIGH (ref 26.0–32.0)
MCHC: 34.1 % (ref 32.0–36.0)
MCV: 100 um3 — ABNORMAL HIGH (ref 79.0–95.0)
MPV: 10.2 fL (ref 9.4–12.4)
Monocytes: 6 % (ref 5–12)
Plt Count: 139 10*3/uL — ABNORMAL LOW (ref 140–370)
RBC: 2.55 10*6/uL — ABNORMAL LOW (ref 3.90–5.20)
RDW: 19.6 % — ABNORMAL HIGH (ref 12.0–14.0)
Segs: 91 % — ABNORMAL HIGH (ref 34–71)
WBC: 14.6 10*3/uL — ABNORMAL HIGH (ref 4.0–10.0)

## 2014-10-11 LAB — URINE ELECTROLYTES
Chloride, Urine: <20 mmol/L
Potassium, Urine: 19 mmol/L
Sodium, Urine: <20 mmol/L

## 2014-10-11 LAB — GLUCOSE (POCT)
Glucose (POCT): 300 mg/dL — ABNORMAL HIGH (ref 70–115)
Glucose (POCT): 303 mg/dL — ABNORMAL HIGH (ref 70–115)
Glucose (POCT): 334 mg/dL — ABNORMAL HIGH (ref 70–115)
Glucose (POCT): 288 mg/dL — ABNORMAL HIGH (ref 70–115)

## 2014-10-11 LAB — MAGNESIUM, BLOOD: Magnesium: 2.7 mg/dL — ABNORMAL HIGH (ref 1.7–2.6)

## 2014-10-11 LAB — RANDOM URINE UREA NITROGEN: Urea Nitrogen, Urine: 195 mg/dL

## 2014-10-11 LAB — FLUID CULTURE IN BLOOD BOTTLES: Fluid Culture Result: NO GROWTH

## 2014-10-11 LAB — STERILE FLUID CULTURE W/GRAM STAIN, AEROBIC: Fluid Culture Result: NO GROWTH

## 2014-10-11 MED ORDER — LORAZEPAM 0.5 MG OR TABS
0.5000 mg | ORAL_TABLET | Freq: Every day | ORAL | Status: DC | PRN
Start: 2014-10-11 — End: 2014-10-13

## 2014-10-11 MED ORDER — INSULIN GLARGINE 100 UNIT/ML SC SOLN
4.00 [IU] | Freq: Once | SUBCUTANEOUS | Status: AC
Start: 2014-10-11 — End: 2014-10-11
  Administered 2014-10-11: 4 [IU] via SUBCUTANEOUS
  Filled 2014-10-11: qty 4

## 2014-10-11 MED ORDER — DILTIAZEM HCL ER BEADS 180 MG OR CP24
180.0000 mg | ORAL_CAPSULE | Freq: Every day | ORAL | Status: DC
Start: 2014-10-12 — End: 2014-10-13
  Administered 2014-10-12: 180 mg via ORAL
  Filled 2014-10-11 (×2): qty 1

## 2014-10-11 MED ORDER — INSULIN GLARGINE 100 UNIT/ML SC SOLN
32.0000 [IU] | Freq: Every day | SUBCUTANEOUS | Status: DC
Start: 2014-10-12 — End: 2014-10-13
  Administered 2014-10-12: 32 [IU] via SUBCUTANEOUS
  Filled 2014-10-11: qty 32

## 2014-10-11 NOTE — Progress Notes (Signed)
GI/Hepatology Daily Progress Note:    Consult Fellow: Fredric Mare, MD  Consult Attending:  , MD    10/11/2014   Current Hospital Stay:   18 days - Admitted on: 09/25/2014    Events/Subjective:  - Ongoing air hunger, predominantly overnight  - Last paracentesis 2/29 with 1.3L removed, minimal lasting improvement  - No fevers or chills.        Objective:  Vital Signs:  Temperature:  [97.4 F (36.3 C)-97.9 F (36.6 C)] 97.4 F (36.3 C) (03/02 1200)  Blood pressure (BP): (102-130)/(38-57) 117/38 mmHg (03/02 1600)  Heart Rate:  [74-95] 76 (03/02 1600)  Respirations:  [18-23] 22 (03/02 1600)  Pain Score:  [-] 0 (03/02 1600)  O2 Device:  [-] Nasal cannula (03/02 1600)  O2 Flow Rate (L/min):  [2 l/min-3 l/min] 2 l/min (03/02 1600)  SpO2:  [93 %-99 %] 96 % (03/02 1600)    Wt Readings from Last 1 Encounters:   10/11/14 50 kg (110 lb 3.7 oz)       Intake/Output (Current Shift):  03/02 0600 - 03/02 1759  In: 700 [P.O.:600; I.V.:100]  Out: 49 [Urine:70]    Physical Exam:  General: Well developed, thin elderly Hispanic female in no apparent distress  HEENT: No scleral icterus, Trachea midline, mucus membranes moist.  Lungs: Crackles bilaterally, appearing dyspneic  Cardiovascular: Irregularly irregular  Abdomen: Abdomen less tense, non tender to palpation, bowel sound present. No peritoneal signs.   Extremities: Trace peripheral edema. Warm well-perfused.  Skin: Non-icteric and no visible rash  Neuro/Psych: Awake. No asterixis    Laboratory data:  Labs reviewed:  Lab Results   Component Value Date    NA 131* 10/11/2014    K 3.9 10/11/2014    CL 97* 10/11/2014    BICARB 12* 10/11/2014    BUN 36* 10/11/2014    CREAT 1.44* 10/11/2014    GLU 256* 10/11/2014    [place] 8.8 10/11/2014     Lab Results   Component Value Date    WBC 14.6* 10/11/2014    HGB 8.7* 10/11/2014    HCT 25.5* 10/11/2014    PLT 139* 10/11/2014    SEG 91* 10/11/2014    LYMPHS 2* 10/11/2014    MONOS 6 10/11/2014     Lab Results   Component Value Date    AST 20  10/11/2014    ALT 16 10/11/2014    ALK 118 10/11/2014    TBILI 3.19* 10/11/2014    TP 6.5 10/11/2014    ALB 3.8 10/11/2014     Lab Results   Component Value Date    INR 1.8 10/11/2014     Prior Endoscopy:   EGD 05/2013:  Findings: 1. Midline orophyarngeal prominence at the  arytenoid  2. Three columnns of grade 1 esophageal varices, no high  risk features  3. No gastric varices, mild portal hypertensive gastropathy  4. Few punctate clots in the antrum  5. Mild duodenal congestion consistent with portal  hypertension    Endoscopic Diagnosis: 1. Midline orophyarngeal  prominence at the arytenoid  2. Three columnns of grade 1 esophageal varices, no high  risk features  3. No gastric varices, mild portal hypertensive gastropathy  4. Few punctate clots in the antrum  5. Mild duodenal congestion consistent with portal  hypertension    Recommendations: 1. Continue propranolol 10 BID  2. ENT consultation to evaluate oropharyngeal findings  3. Repeat EGD in 1 year for varices screening    Radiology:   CT A/P  w/ contrast 09/25/14:   IMPRESSION:  1) New thrombosis of the left portal vein. Vessel was patent on prior abdominal ultrasound 10/04/2014  2) Edema of the distal stomach through proximal jejunum suggestive of enteritis, nonspecific    CT A/P w/o contrast 10/03/14  Compared to the prior CT abdomen pelvis of 09/25/2014, there is new gaseous distention of the large bowel. No evidence of bowel obstruction, transition point or pneumatosis intestinalis. There has been interval resolution of proximal jejunal bowel wall thickening. No other significant changes compared   to the prior study.    Note that assessment of solid organs is limited in the absence of intravenous contrast.    CT head 09/19/2014: IMPRESSION:  1. No evidence of acute intracranial hemorrhage or findings to suggest territorial/transcortical infarct.  2. Mild white matter changes. These may be associated with prior small vessel ischemia. If there is concern for  encephalopathy, MRI would be more helpful for acute evaluation. MRI screening: No intracranial or intraorbital metal.  3. Frothy secretions within the right sphenoid sinuses well as reactive bone changes, suggestive of acute on chronic sinusitis.    RUQ U/S 09/19/2014: FINDINGS:  The liver measures 11.3 cm in long axis. It is normal in echogenicity. No focal lesions are identified. There is no intra or extra hepatic bile duct dilation. The common bile duct measures four mm. The gallbladder is not visualized. A patent TIPS catheter is present. The TIPS velocities are: Portal end 115 ; mid 93 ; hepatic 125 cm/s. Flow in the right and left portal veins is hepatofugal and towards the TIPS ; flow in the main portal vein is hepatopetal.  The right kidney measures nine cm in long axis and is within normal limits with no hydronephrosis or renal calculi.  The visualized portion of the pancreas again demonstrates multiple cystic lesions throughout the pancreas, as seen on prior imaging.  The visualized aorta and inferior vena cava are within normal limits.  Right pleural effusion. No evidence of intra abdominal ascites.    IMPRESSION:  Patent TIPS catheter with normal velocities and appropriate flow in portal veins.  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    RUQ u/s 10/10/14  The liver measures 12.1 cm in long axis. It is course in echogenicity with nodular contour. There is no intra or extra hepatic bile duct dilation. The common bile duct was not clearly visualized. The gallbladder was surgically   removed. A 3.0 x 2.4 x 2.4 cm anechoic avascular area seen within the gallbladder bed, most likely representing loculated fluid in the presence of ascites.    The right kidney measures 7.8 cm in long axis. The right kidney shows no evidence of hydronephrosis or renal calculi.    The visualized portion of the pancreas is within normal limits.    Moderate  ascites and large right pleural effusion are seen.    The visualized aorta and inferior vena cava are within normal limits.    TIPS noted with velocities as follows:  1. Hepatic end: 111 cm/s  2. Mid: 88 cm/s  3. Portal venous end: 70 cm/s    The left and right portal venous flow are bidirectional. Main portal venous flow is normal (hepatopetal).    CXR:  09/25/2014  Associated right basal atelectasis.  Increased prominent of the right heart. Dilated azygos arch representing increased intravascular volume.  No other change.     10/05/14  IMPRESSION: Moderate right  pleural effusion, increased from radiograph     10/01/2014  IMPRESSION: Slightly faded but more extensive opacity in the periphery of the left mid lung zone. Possible left perihilar opacities. Infection is considered in the appropriate setting.  Improved left basal aeration.      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     Confusion associated with poor medication compliance, including lactulose, N/V, abd pain, diarrhea, and f/c/s. HDS. Work-up here showed GI pathogen panel with norovirus and recent UA consistent with UTI.  Course complicated by recurring HHT and ongoing air hunger/dyspnea.    #Hypoxic Respiratory Failure, stable: Satting well on 3L supplemental O2, though with ongoing air hunger/dyspnea.  Likely secondary to underlying COPD with pulmonary edema and reaccumulating pleural effusion from HHT.  Query possible anxiety component as well.     Unfortunately had minimal subjective improvement after thoracentesis.      - 24h I/O net positive despite increase in lasix yesterday with associated AKI now precluding further medical  management. - Continue strict I/O's  - IV albumin 25% BID until serum albumin > 3  - wean O2 as tolerated  - minimal emphysematous changes seen on CT, can start advair/spiriva and encourage PRN albuterol use  - Repeat CXR tomorrow, thoracentesis if effusion has  reaccumulated.   - Discuss with CT Surgery regarding pleurodesis as patient with ongoing symptoms refractory to medical management.     # HCV GT2 cirrhosis c/b ascites, HE and diuretic resistant HHT now s/p TIPS 06/28/2014 and TIPS revision 09/14/14 MELD 21/MELD-Na 25    - Poor nutritional status and overall poor prognosis outside of acute setting  - Ongoing family discussion regarding goals of care   - begin calorie counts, may require feeding tube if not taking in adequate PO    * HE: Continue lactulose, titrate for 3-4 soft bowel movements a day and rifaximin  * Ascites: diuretics held given AKI   * EV's: S/p TIPS, no longer needs surveillance with patent TIPS and global patient stability   * HHT: S/p TIPS and TIPS revision  - RUQ U/S 3/1 with patent TIPS  * Non-occlusive portal vein thrombosis: Multiple outpatient notes indicate poor medication adherence/knowledge, remains fall risk as well. Risks outweigh benefits  * [place] surveillance: Last CT on 06/27/2014 showed no HCC.  Negative RUQ u/s 10/2014.  Repeat imaging in May 2016.  * Transplant: Age >70, not a candidate    # Afib w RVR: Rate controlled on diltiazem.  CHADS2=2, CHADS2-VASC=4. High risk of fall due to HE, increased risk of coagulopathy given cirrhosis.   - hold anticoagulation given risks outweigh benefits of therapy  - cont rate control    # Norovirus: likely responsible for initial N/V, abd pain and diarrhea on presentation  - Supportive care with IVF and anti-emetics as needed    #AMS: Resolved. Likely precipitated by infection from UTI and norovirus infection.   -deferring previous plans to downsize TIPS given several infectious precipitants and TIPS downsizing will increase rate of pleural  effusion accumulation.  - Frequent re-orientation  - Continue lactulose/rifaxmin    # Sepsis/abdominal pain, resolved.  HCAP: X ray with colonic ileus, up 7.9  cm colon 09/19/2014. Improved leukocytosis. Thoracentesis with 1400 wbc/70% neutrophils, with repeat 1L thora on 2/26 with improved PMN count.  - Vanco/zosyn discontinued (02/23-2/28), given negative cultures, overall improvement  - cont supportive care for norovirus  - ct non con:L gaseous distention, no transition point    Patient discussed with Dr. Andreas Ohm    Electronically signed by:  Fredric Mare  Gastroenterology Fellow  10/11/2014  4:45 PM

## 2014-10-11 NOTE — Plan of Care (Signed)
Report given to Orbie Pyo. Plan of care, skin issues and pain issues discussed. VSS. Care endorsed.

## 2014-10-11 NOTE — Plan of Care (Signed)
Problem: Discharge Planning  Goal: Participation in care planning  Outcome: Not Met  No discharge date yet.    Problem: Falls, Risk of  Goal: Keep patient free from falls utilizing universal fall precautions  Outcome: Met  No fall. Fall precaution enforced. Will continue to monitor.    Problem: Tissue Perfusion, Cardiopulmonary - Altered  Goal: Hemodynamic stability  Outcome: Met  HR remaian on 80-90's a-fib. On dilt  P.o as routine meds.    Problem: Pain - Acute  Goal: Communication of presence of pain  Outcome: Met  Headache complaint from coughing episodes.tramdol administered and with pain relief.    Problem: Alteration in Blood Glucose  Goal: Glucose level within specified parameters  Outcome: Not Met  FS 251 tonight. Insulin coverage given.will continue to monitor,.    Problem: Skin Integrity- Intact patient high risk for impaired skin integrity Braden scale less than or equal to 18  Goal: Skin remains intact  Outcome: Not Met  Excoriation on b uttocks from frequent stooling. Will monitor. Area maintain clean and dry with protective ointment apllied.    Problem: Aspiration, Risk of  Goal: Evidence of swallowing difficulties is absent  Outcome: Met  Tolerated apple sauce.no aspiration observed.    Problem: Breathing Pattern - Ineffective  Goal: Respiratory rate, rhythm and depth return to baseline  Outcome: Met  Remain on 2 liters nasal cannula,sats >95%.short of breath complaint with even unlabored breaths,encouraged to do deep breathing exercises.and fan turned on and faced to her face on low control.

## 2014-10-11 NOTE — Plan of Care (Signed)
Problem: Falls, Risk of  Goal: Keep patient free from falls utilizing universal fall precautions  Patient alert and oriented x 4. Bed in lowest, locked position. Side rails up x 2. Bed alarm on. Call light in reach and patient aware to call nurse for help when needed. Patient has non skid socks on. High fall risk sign on door, high fall risk clasp on ID. Family at bedside. Will continue to monitor and do hourly rounding.            Problem: Tissue Perfusion, Cardiopulmonary - Altered  Goal: Hemodynamic stability  Cardiac rhythm afib. Edema noted to right upper extremity. VSS.     Problem: Pain - Acute  Goal: Communication of presence of pain  Patient has no c/o pain at this time. She is aware to notify Rn for pain medication if needed or if pain is not tolerable. Will continue to monitor. See document flow sheet for reassessment.         Problem: Alteration in Blood Glucose  Goal: Glucose level within specified parameters  Blood sugar this am=300. md paged to advise. Will continue to monitor ac/hs as ordered and administer insulin if needed.     Problem: Infection  Goal: Absence of infection signs and symptoms  Patient afebrile at this time. Will continue to monitor.     Problem: Skin Integrity- Intact patient high risk for impaired skin integrity Braden scale less than or equal to 18  Goal: Skin remains intact  Patient has no pressure related skin issues noted. Patient turned and repositioned. Skin clean and dry. Boney prominences elevated. Protective ointment applied to buttocks/sacral area. Patient has skin tear noted to left upper extremity covered with mepilex dressing. Will continue to monitor and turn patient q 2 hours to prevent skin breakdown.         Problem: Aspiration, Risk of  Goal: Evidence of swallowing difficulties is absent  Head of bed up with all meals to prevent aspiration. Patient has no signs of aspiration during consumption of meal.     Problem: Breathing Pattern - Ineffective  Goal:  Respiratory rate, rhythm and depth return to baseline  Patient on 3 liters 02/nasal cannula. She continues to c/o sob at rest. 02 sat wnl.

## 2014-10-11 NOTE — Plan of Care (Signed)
Problem: Nutrition Deficit  Goal: Adequate nutritional intake  Patient continues to have poor appetite. Calorie counts in place. Order received from [facility] insertion but patient refusing. Family to discuss with patient and if patient agrees will inform RN. Patient also has had no urine output the past 2 hours. 1st call md paged to inform.

## 2014-10-11 NOTE — Interdisciplinary (Signed)
CPT protocol D/C'd as pt does not meet criteria as pt has not produced any sputum with CPT.  Pt can still benefit from respiratory services.  Pt transitioned to lung expansion protocol.

## 2014-10-11 NOTE — Progress Notes (Signed)
Inpatient Medicine Progress Note       Interval Events/Subjective:   Thoracentesis completed in IR yesterday with 1.3L removed.  Pt said she did not feel better after procedure.   Cont to c/o sob throughout evening/am though no desaturations/stable oxygen requirement.  No f/c.    Vitals signs:     Latest Entry  Range (last 24 hours)    Temperature: 97.9 F (36.6 C)  Temp  Avg: 97.7 F (36.5 C)  Min: 97.5 F (36.4 C)  Max: 97.9 F (36.6 C)    Blood pressure (BP): (!) 102/38 mmHg  BP  Min: 102/38  Max: 130/51    Heart Rate: 77  Pulse  Avg: 84.8  Min: 74  Max: 99    Respirations: 20  Resp  Avg: 20  Min: 18  Max: 23    SpO2: 94 %  SpO2  Avg: 96.3 %  Min: 93 %  Max: 100 %       No Data Recorded     Yesterday's intake and output:  03/01 0600 - 03/02 0559  In: 933 [P.O.:830; I.V.:103]  Out: 340 [Urine:340]    Physical Exam  GENERAL:  Thin appearing woman in NAD.  Pleasant and cooperative.     HEENT: EOMI. Oropharynx appears moist. No LAD.   CARDIOVASCULAR:  Tachycardic and irregular rhythm.   LUNGS: CTA on L, absent bs on R, no wheezing  ABDOMEN: Soft, non-distended. Normoactive bowel sounds. Non-tender  EXTREMITIES: Warm and well perfused.   No asterixis     Labs seen and reviewed.       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     # acute respiratory failure: 2/2 increased pleural effusion. Had improvement after thoracentesis on 2/26 and 2/29.  Has not responded to iv diuresis and has now developed AKI.  Will consult IR for repeat thoracentesis, cont nebs prn, try low dose ativan prn for dyspnea if not desaturating.  Repeat cxr ordered.  Overall prognosis poor for pt and little options for improving hepatic hydrothorax.  TIPS appears to be patent but has not helped reduce reaccumulation of fluid.  Hepatology met with family on 3/1 to discuss treatment options  and goals of care. Family wants to cont pursuing interventions that may help and are not ready for hospice.  Will consult CT surgery to eval if pt is candidate for pleurodesis    # AKI: suspect 2/2 diuresis.  Recheck ua, urine lytes.  Received lasix/aldactone today but will hold further doses.  Cont scheduled albumin.    #possible sepsis: prior infections have been treated but meets criteria w/intermittent tachycardia, tachypnea, leukocytosis though no new infectious sx   - completed vanc zosyn x6d for HCAP, poss SBE, stopped 2/28  - f/u pleural fluid cultures; resend pleural fluid for analysis with repeat thoracentesis -> fluid transudative, improved wbc  - f/u repeat urine culture    #AMS: multifactorial, contribution of HE + infection with norovirus. Improved   -continue lactulose, rifaximin   -frequent re-orientation, day/night cycle   -avoid sedating meds    #HCV cirrhosis: status post TIPS (06/2014) with recent revision no 09/14/14. Abdominal ultrasound on admission shows patency of vessels on 2/16 and 3/1.   --Hepatic encephalopathy: continue lactulose/rifaximin, Improving. defer downsizing of TIPS given that downsizing will likely lead to recurrent re accumulating HHT.   --  HHT: cont diuretics, last thoracentesis done 2/26 and 2/29 with 1L removed. 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   --Ascites: lasix 20/aldactone 100 -> gave addl lasix 40 mg iv for dyspnea. S/p TIPS.    --HCC: CT 06/2014 without lesions c/f HCC, repeat due in May.     #Afib: Initially in RVR but HR now better controlled on oral diltiazem  -continue diltiazem, change to ER once dose determined   -Previous team had extensive discussion with patient and family regarding risk/benefits of anticoagulation and family wanted to pursue [facility] with xarelto however will defer beginning ac at this time given patient is at a high fall risk in setting of acute encephalopathy. Patient and family expressed agreement  with this plan and discussion regarding ac will be revisited pending improvement in her encephalopathy.     #norovirus: resolved.    #DM2: not well controlled, appreciate endocrine recs  --cont lantus 28units and 12units lispro qac per endocrine recs  --add insulin coverage for glucerna supplements   --high intensity SSI     #Osteoporosis  --Continue home calcium and vitamin D    #GERD:   --Continue pepcid 20 mg daily    # moderate malnutrition: cont glucerna.  appreciate nutrition recs.     VTE PPx: SQ heparin   CODE : Full Code  DISPO: Pending clinical improvement

## 2014-10-11 NOTE — Progress Notes (Signed)
Endocrine/Diabetes Progress Note    Overnight:  No acute events, remains hyperglycemic    Subjective: reports ongoing SOB    Objective:   Temperature:  [97.4 F (36.3 C)-97.9 F (36.6 C)] 97.4 F (36.3 C) (03/02 1200)  Blood pressure (BP): (102-130)/(38-57) 104/38 mmHg (03/02 1200)  Heart Rate:  [74-95] 78 (03/02 1226)  Respirations:  [18-23] 19 (03/02 1200)  Pain Score:  [-] 0 (03/02 1200)  O2 Device:  [-] Nasal cannula (03/02 1200)  O2 Flow Rate (L/min):  [3 l/min] 3 l/min (03/02 1200)  SpO2:  [93 %-99 %] 97 % (03/02 1200)  Body mass index is 20.84 kg/(m^2).    GEN: NAD, chronically ill appearing  PULM: breathing comfortably    Diet: pureed carb limited + glucerna TID    Blood Sugars reviewed:        AM  Lu  Di  HS  O/N    2/28  114 162 180 159  Lantus   35  Lispro  8+6 8+3 6+3    2/29  88 113 231 213  Lantus   28  Lispro  12+8  4+6 +1    3/1  227 265 329 251  Lantus   28  Lispro  8+6 6+8+8 6+8+10  6    3/2  300  Lantus    Lispro  8+6      A/P:  Paula Manning is a 74 year old female with uncontrolled DM2 c/b neuropathy as well as HepC cirrhosis, HTN, glaucoma and migraines admitted 10/02/2014 w/ n/v and AMS.     BG trend remains high since decreasing Lantus dose 2 days ago for fasting glucose < 100 mg/dL. Per pt, no known overnight intake, recommend increasing lantus today.     Inpatient Recs: paged to first call  - increase lantus  28 -> 32 units qday  - cont Lispro 12 units qac meals (RN to give 0, 1/2, whole dose based on % tray consumed)  - cont Lispro 8 units per can of Glucerna once diet advances (RN to give 0, 1/2, whole dose based on % tray consumed)  - continue high Lispro correction scale q ac/hs    Outpatient Recs:  Was on Lantus 20 units qhs, Januvia 50 mg qday and glipizide 2.5 mg bid w/ A1C 6.2%, but home BG 200-400s in setting of OJ-carrot juice and ++rice/beans intake. Final doses TBD based on inpatient needs.    will cont to follow

## 2014-10-11 NOTE — Plan of Care (Signed)
Problem: Alteration in Blood Glucose  Goal: Glucose level within specified parameters  Blood sugar=334. md paged to advise.

## 2014-10-11 NOTE — Plan of Care (Signed)
Problem: Nutrition Deficit  Goal: Adequate nutritional intake  Patient continues to refuse NG tube insertion. Patient stating that she will increased food consumption.

## 2014-10-11 NOTE — Consults (Addendum)
HISTORY AND PHYSICAL    Attending MD:   Brentton Wardlow    Chief Complaint:  Re-accumulation of hepatic hydrothorax    Pain Assessment:  The patient denies any pain.    History of Present Illness:     Paula Manning is a 74 year old female with a PMH HCV, afib, cirrhosis (ascietes, esophageal varices, encephalopathy), s/p TIPS and recent revision, s/p thoracentesis in IR on  2/22, 2/26, and 2/29 who has increased dyspnea thought to be related to recurrent hepatic hydrothorax despite TIPS, and aggressive diuresis. We are being consulted for pleurodesis. She has not had any episodes of desaturation, and has had stable oxygen requirements. She does not feel any improvement after the 1.3L thoracentesis yesterday.       Past Medical and Surgical History:  Past Medical History   Diagnosis Date    Migraine headache     Glaucoma     Ascites 06/29/2007     History of paracentesis x 2    Pancreatic cyst 06/29/2007    Cirrhosis ([place]) 06/24/2007     Active on the liver transplant waiting list with a blood type of O positive. 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 Due to Hep C    Osteopenia 06/24/2007    Emphysema 06/24/2007    EV (esophageal varices) ([place]) 06/10/2007    Anemia 06/10/2007    HCV (hepatitis C virus) 06/10/2007     Achieved SVR.    Type II or unspecified type diabetes mellitus with peripheral circulatory disorders, not stated as uncontrolled(250.70) 06/10/2007    HTN 06/10/2007    HTN 06/10/2007     Past Surgical History   Procedure Laterality Date    Pb laps surg cholecstc w/expl common duct  1970s     in [place];  open CCX    Pb cesarean delivery only      Pb cstoplasty/cstourtp plstc any       Bladder suspension    Egd procedure  06/01/2013     Procedure: GI EGD;  Surgeon: Jeanette Caprice, MD;  Location: [facility];  Service: Gastroenterology;;    Tips procedure         Allergies:  No Known Allergies    Medications:  Prescriptions prior to admission   Medication Sig Dispense  Refill Last Dose    alendronate (FOSAMAX) 70 MG tablet    09/22/2014    ALPHAGAN P 0.1 % ophthalmic solution    09/22/2014    bacitracin 500 UNIT/GM ointment Apply small amount to affected area on face twice daily 1 Tube 1 09/22/2014    CALCIUM CARBONATE 600 MG OR TABS 1 tablet 2 times daily one month 11 09/22/2014    ciprofloxacin (CIPRO) 500 MG tablet Take 1 tablet by mouth daily. 30 tablet 5 09/22/2014    furosemide (LASIX) 20 MG tablet Take 2 tablets by mouth daily. 90 tablet 0 09/22/2014    glipiZIDE (GLUCOTROL) 5 MG tablet Take 0.5 tablets (2.5 mg) by mouth 2 times daily. 30 tablet 1 09/22/2014    insulin glargine (LANTUS) 100 UNIT/ML injection Inject 20 Units under the skin daily. 10 mL 1 09/22/2014    lactulose 10 GM/15ML solution Take 30 mLs (20 g) by mouth 3 times daily. 946 mL 0 09/22/2014    LUMIGAN 0.01 %    09/22/2014    multivitamin (MULTI-VITAMIN) tablet Take 1 tablet by mouth daily.   09/22/2014    ranitidine (ZANTAC) 300 MG tablet Take 300 mg by mouth 2 times daily.  09/22/2014    rifaximin (XIFAXAN) 550 MG tablet Take 550 mg by mouth every 12 hours.   09/22/2014    sitagliptin (JANUVIA) 50 MG tablet Take 1 tablet (50 mg) by mouth daily. 60 tablet 1 09/22/2014    spironolactone (ALDACTONE) 50 MG tablet Take 1 tablet (50 mg) by mouth daily. 30 tablet 0 09/22/2014    VITAMIN D 400 UNIT OR CAPS 1 CAPSULE DAILY 30 11 09/22/2014       Social History:  History     Social History    Marital Status: Married     Spouse Name: N/A    Number of Children: N/A    Years of Education: N/A     Occupational History    retired      Social History Main Topics    Smoking status: Former Smoker -- 10 years     Quit date: 11/13/2012    Smokeless tobacco: Never Used    Alcohol Use: No    Drug Use: No    Sexual Activity: Not on file     Other Topics Concern    Not on file     Social History Narrative       Family History:  No family history on file.    Review of Systems:      Physical Exam:  BP 104/38 mmHg    Pulse 78   Temp(Src) 97.4 F (36.3 C)   Resp 19   Ht _0  (1.549 m)   Wt 50 kg (110 lb 3.7 oz)   BMI 20.84 kg/m2   SpO2 97%      Labs and Other Data:  Lab Results   Component Value Date    NA 131* 10/11/2014    K 3.9 10/11/2014    CL 97* 10/11/2014    BICARB 12* 10/11/2014    BUN 36* 10/11/2014    CREAT 1.44* 10/11/2014    GLU 256* 10/11/2014    [place] 8.8 10/11/2014     Lab Results   Component Value Date    WBC 14.6* 10/11/2014    HGB 8.7* 10/11/2014    HCT 25.5* 10/11/2014    PLT 139* 10/11/2014    SEG 91* 10/11/2014    LYMPHS 2* 10/11/2014    MONOS 6 10/11/2014     Lab Results   Component Value Date    AST 20 10/11/2014    ALT 16 10/11/2014    ALK 118 10/11/2014    TBILI 3.19* 10/11/2014    TP 6.5 10/11/2014    ALB 3.8 10/11/2014     Lab Results   Component Value Date    INR 1.8 10/11/2014       Assessment and Care Plan:  1. Recurrent hepatic hydrothorax. S/p thoracentesis on 2/22, 2/26, 2/29. Still c/o dyspnea. Recommend CT chest non contrast.  2. HCV, cirrhosis with ascietes, encephalopathy and EV. S/p Recent TIPS and revision. Continue care per Hepatology/medicine.  3. Afib controlled rate with oral diltiazem  4. DM- on insulins  5. AKI- ? r/t overdiuresis    This plan and alternatives have not been discussed with the patient and/or surrogate.    Code Status:    Code Status  Full Code - Call Code    The patient's primary care physician or clinic has not been contacted regarding this admission.    Note Author: Rolanda Jay, 10/11/2014, 1:09 PM    Attending Addendum:  Patient course and imaging studies reviewed.  Patient will need a non-contrast chest,  abdomen, pelvic CT for me to evaluate amount of ascites and pleural effusion.

## 2014-10-11 NOTE — Interdisciplinary (Signed)
Speech Therapy Daily Treatment Note       Assessment              Assessment : Pt continues to have mild oropharyngeal dysphagia significantly impacted by generalized weakness. Recommend continue pureed solids and thin liquids, pt appeared to tolerate straw sips better due to better control of intake. ST to f/u to continue to safety of prescribed diet.      Subjective   Subjective   Treatment Start Time : 7341  Treatment type : Dysphagia  Subjective : Agreeable to treatment  Pain Rating (0-10): 0  Pain Location: No pain reported at this time.    Objective  Objective : Pt seen this afternoon with in-house Spanish Interpreter. Pt initially asleep, however, once awake agreeable to limited amount of tx. Supportive family present, RN reports MDs have ordered keo feeding tube placement as pt has poor PO intake. Pt observed taking water via cup and straw and took 3 bites of yogurt. Pt's swallow c/b increased difficulty coordinating intake from cup sip with better control from a straw sip, slightly delayed trigger of pharyngeal swallow, and seemingly adequate laryngeal elevation. Pt with cough x1, however, voice remained clear. Continued pt and family education regarding safe swallow strategies and current level of function significantly impacted by generalized weakness as pt is taking very limited amounts of food. Pt reports she has no appetite.    Treatment Today  93790  Tx of swallow dysfunction and/or oral function for feeding: PO trials;Puree;Thin liquid;Patient/family education  Total TIMED Treatment (min) : 0    Plan  Patient to continue therapy for Diagnosis : Oropharyngeal dysphagia  Plan: 92526  Tx of swallowing dysfunction and/or oral function for feeding  Frequency: 2x week       Goals                                                                                                Progress   Long term goals  Swallow: Swallow: Pt will tolerate least restrictive oral diet without e/o laryngeal aspiration  Visits: 6     Short term goals (Swallow)   Goal: Pt will tolerate pureed diet with regular liquids without symptoms of laryngeal penetration/aspiration  Visits : 2  Status : Continue  Goal: Pt will utilize compensatory strategies independently  Visits : 4  Status : Continue  Goal: Pt will tolerate mechanical soft diet with regular liquids without symptoms of laryngeal penetration/aspiration  Visits : 7  Status : Revise Pt. has made minimal progress towards achievement of goals   Patient stated goal: water, more fruit            Total Treatment Time (min): 15  Total TIMED Treatment (min) : 0

## 2014-10-12 DIAGNOSIS — Z452 Encounter for adjustment and management of vascular access device: Secondary | ICD-10-CM

## 2014-10-12 DIAGNOSIS — R262 Difficulty in walking, not elsewhere classified: Secondary | ICD-10-CM

## 2014-10-12 DIAGNOSIS — N179 Acute kidney failure, unspecified: Secondary | ICD-10-CM

## 2014-10-12 DIAGNOSIS — J80 Acute respiratory distress syndrome: Secondary | ICD-10-CM

## 2014-10-12 LAB — ARTERIAL BLOOD GAS
BE, Art: -10.7 mmol/L — ABNORMAL LOW (ref <=2.3)
Flow Rate: 9 L/min
HCO3, Art: 16 mmol/L — ABNORMAL LOW (ref 20–29)
O2 Sat, Art (Est): 89.7 % (ref 94–100)
Temp: 36.2 'C
pCO2, Art (T): 35 mmHg — ABNORMAL LOW (ref 36–46)
pCO2, Art (Uncorr): 36 mmHg (ref 36–46)
pH, Art (T): 7.26 — ABNORMAL LOW (ref 7.35–7.46)
pH, Art (Uncorr): 7.25 (ref 7.35–7.46)
pO2, Art (T): 62 mmHg — ABNORMAL LOW (ref 74–109)
pO2, Art (Uncorr): 65 mmHg (ref 74–109)

## 2014-10-12 LAB — GLUCOSE (POCT)
Glucose (POCT): 232 mg/dL — ABNORMAL HIGH (ref 70–115)
Glucose (POCT): 271 mg/dL — ABNORMAL HIGH (ref 70–115)
Glucose (POCT): 179 mg/dL — ABNORMAL HIGH (ref 70–115)
Glucose (POCT): 173 mg/dL — ABNORMAL HIGH (ref 70–115)

## 2014-10-12 LAB — CBC WITH DIFF, BLOOD
ANC-Automated: 11.7 10*3/uL — ABNORMAL HIGH (ref 1.6–7.0)
Abs Lymphs: 0.4 10*3/uL — ABNORMAL LOW (ref 0.8–3.1)
Abs Monos: 1 10*3/uL — ABNORMAL HIGH (ref 0.2–0.8)
Hct: 23.8 % — ABNORMAL LOW (ref 34.0–45.0)
Hgb: 8.1 gm/dL — ABNORMAL LOW (ref 11.2–15.7)
Imm Gran %: 1 % (ref <1)
Imm Gran Abs: 0.1 10*3/uL (ref <0.1)
Lymphocytes: 3 % — ABNORMAL LOW (ref 19–53)
MCH: 33.6 pg — ABNORMAL HIGH (ref 26.0–32.0)
MCHC: 34 % (ref 32.0–36.0)
MCV: 98.8 um3 — ABNORMAL HIGH (ref 79.0–95.0)
MPV: 10.8 fL (ref 9.4–12.4)
Monocytes: 8 % (ref 5–12)
Plt Count: 94 10*3/uL — ABNORMAL LOW (ref 140–370)
RBC: 2.41 10*6/uL — ABNORMAL LOW (ref 3.90–5.20)
RDW: 19.5 % — ABNORMAL HIGH (ref 12.0–14.0)
Segs: 88 % — ABNORMAL HIGH (ref 34–71)
WBC: 13.2 10*3/uL — ABNORMAL HIGH (ref 4.0–10.0)

## 2014-10-12 LAB — BASIC METABOLIC PANEL, BLOOD
Anion Gap: 20 mmol/L — ABNORMAL HIGH (ref 7–15)
BUN: 44 mg/dL — ABNORMAL HIGH (ref 6–20)
Bicarbonate: 13 mmol/L — ABNORMAL LOW (ref 22–29)
Calcium: 8.9 mg/dL (ref 8.5–10.6)
Chloride: 96 mmol/L — ABNORMAL LOW (ref 98–107)
Creatinine: 2.31 mg/dL — ABNORMAL HIGH (ref 0.51–0.95)
GFR: 21 mL/min
Glucose: 230 mg/dL — ABNORMAL HIGH (ref 70–115)
Potassium: 3.9 mmol/L (ref 3.5–5.1)
Sodium: 129 mmol/L — ABNORMAL LOW (ref 136–145)

## 2014-10-12 LAB — PROTHROMBIN TIME, BLOOD
INR: 2.1
PT,Patient: 23.3 s — ABNORMAL HIGH (ref 9.7–12.5)

## 2014-10-12 LAB — PHOSPHORUS, BLOOD: Phosphorous: 5.6 mg/dL — ABNORMAL HIGH (ref 2.7–4.5)

## 2014-10-12 LAB — MAGNESIUM, BLOOD: Magnesium: 3 mg/dL — ABNORMAL HIGH (ref 1.7–2.6)

## 2014-10-12 MED ORDER — PHYTONADIONE 10 MG/ML IJ SOLN
10.00 mg | Freq: Once | INTRAMUSCULAR | Status: AC
Start: 2014-10-12 — End: 2014-10-13
  Administered 2014-10-12: 10 mg via INTRAVENOUS
  Filled 2014-10-12: qty 1

## 2014-10-12 MED ORDER — SODIUM CHLORIDE 0.9 % IV SOLN
Freq: Once | INTRAVENOUS | Status: AC
Start: 2014-10-12 — End: 2014-10-12
  Administered 2014-10-12: 11:00:00 via INTRAVENOUS

## 2014-10-12 MED ORDER — VANCOMYCIN PER PHARMACY
INTRAVENOUS | Status: DC
Start: 2014-10-12 — End: 2014-10-13

## 2014-10-12 MED ORDER — SODIUM CHLORIDE 0.9 % IV SOLN
Freq: Once | INTRAVENOUS | Status: AC | PRN
Start: 2014-10-12 — End: 2014-10-13

## 2014-10-12 MED ORDER — METOCLOPRAMIDE HCL 5 MG/ML IJ SOLN
5.0000 mg | Freq: Two times a day (BID) | INTRAMUSCULAR | Status: DC
Start: 2014-10-12 — End: 2014-10-14
  Administered 2014-10-13 – 2014-10-14 (×4): 5 mg via INTRAVENOUS
  Filled 2014-10-12 (×4): qty 2

## 2014-10-12 MED ORDER — MIDODRINE HCL 5 MG OR TABS
10.0000 mg | ORAL_TABLET | Freq: Three times a day (TID) | ORAL | Status: DC
Start: 2014-10-12 — End: 2014-10-13
  Administered 2014-10-12 (×2): 10 mg via ORAL
  Filled 2014-10-12 (×3): qty 2

## 2014-10-12 MED ORDER — OCTREOTIDE ACETATE 500 MCG/ML IJ SOLN
200.0000 ug | Freq: Three times a day (TID) | INTRAMUSCULAR | Status: DC
Start: 2014-10-12 — End: 2014-10-14
  Administered 2014-10-12 – 2014-10-14 (×7): 200 ug via SUBCUTANEOUS
  Filled 2014-10-12 (×9): qty 0.4

## 2014-10-12 MED ORDER — SODIUM CHLORIDE 0.9 % IV SOLN
1000.0000 mg | INTRAVENOUS | Status: DC
Start: 2014-10-12 — End: 2014-10-12
  Administered 2014-10-12: 1000 mg via INTRAVENOUS
  Filled 2014-10-12: qty 1000

## 2014-10-12 MED ORDER — DESMOPRESSIN ACETATE 4 MCG/ML IJ SOLN
0.30 ug/kg | Freq: Once | INTRAMUSCULAR | Status: AC
Start: 2014-10-12 — End: 2014-10-13
  Administered 2014-10-12: 15 ug via INTRAVENOUS
  Filled 2014-10-12: qty 3.75

## 2014-10-12 MED ORDER — SODIUM CHLORIDE 0.9 % IJ SOLN (CUSTOM)
10.0000 mL | INTRAMUSCULAR | Status: DC | PRN
Start: 2014-10-12 — End: 2014-10-14

## 2014-10-12 MED ORDER — VANCOMYCIN HCL 1000 MG IV SOLR
1000.00 mg | Freq: Once | INTRAVENOUS | Status: AC
Start: 2014-10-12 — End: 2014-10-12
  Administered 2014-10-12: 1000 mg via INTRAVENOUS
  Filled 2014-10-12: qty 1000

## 2014-10-12 MED ORDER — SODIUM CHLORIDE 0.9 % IV SOLN
3375.0000 mg | Freq: Two times a day (BID) | INTRAVENOUS | Status: DC
Start: 2014-10-12 — End: 2014-10-13
  Administered 2014-10-12 – 2014-10-13 (×2): 3375 mg via INTRAVENOUS
  Filled 2014-10-12 (×2): qty 3375

## 2014-10-12 MED ORDER — LACTULOSE 10 GM/15ML OR SOLN
20.0000 g | Freq: Three times a day (TID) | ORAL | Status: DC
Start: 2014-10-13 — End: 2014-10-13
  Filled 2014-10-12: qty 30

## 2014-10-12 MED ORDER — LIDOCAINE HCL 1 % IJ SOLN
5.0000 mL | Freq: Once | INTRAMUSCULAR | Status: AC | PRN
Start: 2014-10-12 — End: 2014-10-13

## 2014-10-12 MED ORDER — INSULIN LISPRO (HUMAN) 100 UNIT/ML SC SOLN (CUSTOM)
10.0000 [IU] | Freq: Three times a day (TID) | INTRAMUSCULAR | Status: DC
Start: 2014-10-12 — End: 2014-10-12
  Administered 2014-10-12: 09:00:00 10 [IU] via SUBCUTANEOUS
  Filled 2014-10-12: qty 10

## 2014-10-12 NOTE — Progress Notes (Signed)
GI/Hepatology Daily Progress Note:    Consult Fellow: Harle Stanford, MD  Consult Attending: Dr. Andreas Ohm, [facility] Stay:   19 days - Admitted on: 10/01/2014    Events/Subjective:  - Ongoing air hunger, predominantly overnight  - Last thoracentesis 2/29 with 1.3L removed, has not yet undergone repeat thoracentesis  - Increased Cr with decreased UOP, nonresponsive to fluid bolus and worsening O2 requirement    Objective:  Vital Signs:  Temperature:  [97.2 F (36.2 C)-97.9 F (36.6 C)] 97.2 F (36.2 C) (03/03 1200)  Blood pressure (BP): (109-135)/(38-63) 135/43 mmHg (03/03 1200)  Heart Rate:  [75-94] 90 (03/03 1254)  Respirations:  [16-24] 23 (03/03 1254)  Pain Score:  [-] 0 (03/03 1200)  O2 Device:  [-] Non-rebreather mask (03/03 1254)  O2 Flow Rate (L/min):  [2 l/min-10 l/min] 10 l/min (03/03 1254)  SpO2:  [84 %-100 %] 100 % (03/03 1254)    Wt Readings from Last 1 Encounters:   10/12/14 49.1 kg (108 lb 3.9 oz)       Intake/Output (Current Shift):  03/03 0600 - 03/03 1759  In: 940 [P.O.:287; I.V.:653]  Out: 20 [Urine:20]    Physical Exam:  General: Well developed, thin elderly Hispanic female in no apparent distress  HEENT: No scleral icterus, Trachea midline, mucus membranes moist.  Lungs: Crackles bilaterally, appearing dyspneic  Cardiovascular: Irregularly irregular  Abdomen: Abdomen less tense, non tender to palpation, bowel sound present. No peritoneal signs.   Extremities: Trace peripheral edema. Warm well-perfused.  Skin: Non-icteric and no visible rash  Neuro/Psych: Awake. No asterixis    Laboratory data:  Labs reviewed:  Lab Results   Component Value Date    NA 129* 10/12/2014    K 3.9 10/12/2014    CL 96* 10/12/2014    BICARB 13* 10/12/2014    BUN 44* 10/12/2014    CREAT 2.31* 10/12/2014    GLU 230* 10/12/2014     8.9 10/12/2014     Lab Results   Component Value Date    WBC 13.2* 10/12/2014    HGB 8.1* 10/12/2014    HCT 23.8* 10/12/2014    PLT 94* 10/12/2014    SEG 88* 10/12/2014    LYMPHS 3*  10/12/2014    MONOS 8 10/12/2014     Lab Results   Component Value Date    AST 20 10/11/2014    ALT 16 10/11/2014    ALK 118 10/11/2014    TBILI 3.19* 10/11/2014    TP 6.5 10/11/2014    ALB 3.8 10/11/2014     Lab Results   Component Value Date    INR 2.1 10/12/2014     Prior Endoscopy:   EGD 05/2013:  Findings: 1. Midline orophyarngeal prominence at the  arytenoid  2. Three columnns of grade 1 esophageal varices, no high  risk features  3. No gastric varices, mild portal hypertensive gastropathy  4. Few punctate clots in the antrum  5. Mild duodenal congestion consistent with portal  hypertension    Endoscopic Diagnosis: 1. Midline orophyarngeal  prominence at the arytenoid  2. Three columnns of grade 1 esophageal varices, no high  risk features  3. No gastric varices, mild portal hypertensive gastropathy  4. Few punctate clots in the antrum  5. Mild duodenal congestion consistent with portal  hypertension    Recommendations: 1. Continue propranolol 10 BID  2. ENT consultation to evaluate oropharyngeal findings  3. Repeat EGD in 1 year for varices screening  Radiology:   CT A/P w/ contrast 09/25/14:   IMPRESSION:  1) New thrombosis of the left portal vein. Vessel was patent on prior abdominal ultrasound 10/01/2014  2) Edema of the distal stomach through proximal jejunum suggestive of enteritis, nonspecific    CT A/P w/o contrast 10/03/14  Compared to the prior CT abdomen pelvis of 09/25/2014, there is new gaseous distention of the large bowel. No evidence of bowel obstruction, transition point or pneumatosis intestinalis. There has been interval resolution of proximal jejunal bowel wall thickening. No other significant changes compared   to the prior study.    Note that assessment of solid organs is limited in the absence of intravenous contrast.    CT head 09/24/2014: IMPRESSION:  1. No evidence of acute intracranial hemorrhage or findings to suggest territorial/transcortical infarct.  2. Mild white matter changes. These  may be associated with prior small vessel ischemia. If there is concern for encephalopathy, MRI would be more helpful for acute evaluation. MRI screening: No intracranial or intraorbital metal.  3. Frothy secretions within the right sphenoid sinuses well as reactive bone changes, suggestive of acute on chronic sinusitis.    RUQ U/S 10/05/2014: FINDINGS:  The liver measures 11.3 cm in long axis. It is normal in echogenicity. No focal lesions are identified. There is no intra or extra hepatic bile duct dilation. The common bile duct measures four mm. The gallbladder is not visualized. A patent TIPS catheter is present. The TIPS velocities are: Portal end 115 ; mid 93 ; hepatic 125 cm/s. Flow in the right and left portal veins is hepatofugal and towards the TIPS ; flow in the main portal vein is hepatopetal.  The right kidney measures nine cm in long axis and is within normal limits with no hydronephrosis or renal calculi.  The visualized portion of the pancreas again demonstrates multiple cystic lesions throughout the pancreas, as seen on prior imaging.  The visualized aorta and inferior vena cava are within normal limits.  Right pleural effusion. No evidence of intra abdominal ascites.    IMPRESSION:  Patent TIPS catheter with normal velocities and appropriate flow in portal veins.  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    RUQ u/s 10/10/14  The liver measures 12.1 cm in long axis. It is course in echogenicity with nodular contour. There is no intra or extra hepatic bile duct dilation. The common bile duct was not clearly visualized. The gallbladder was surgically   removed. A 3.0 x 2.4 x 2.4 cm anechoic avascular area seen within the gallbladder bed, most likely representing loculated fluid in the presence of ascites.    The right kidney measures 7.8 cm in long axis. The right kidney shows no evidence of hydronephrosis or renal  calculi.    The visualized portion of the pancreas is within normal limits.    Moderate ascites and large right pleural effusion are seen.    The visualized aorta and inferior vena cava are within normal limits.    TIPS noted with velocities as follows:  1. Hepatic end: 111 cm/s  2. Mid: 88 cm/s  3. Portal venous end: 70 cm/s    The left and right portal venous flow are bidirectional. Main portal venous flow is normal (hepatopetal).    CXR:  09/22/2014  Associated right basal atelectasis.  Increased prominent of the right heart. Dilated azygos arch representing increased intravascular volume.  No other change.  10/05/14  IMPRESSION: Moderate right pleural effusion, increased from radiograph     10/08/2014  IMPRESSION: Slightly faded but more extensive opacity in the periphery of the left mid lung zone. Possible left perihilar opacities. Infection is considered in the appropriate setting.  Improved left basal aeration.    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     Confusion associated with poor medication compliance, including lactulose, N/V, abd pain, diarrhea, and f/c/s. HDS. Work-up here showed GI pathogen panel with norovirus and recent UA consistent with UTI.  Course complicated by recurring HHT and ongoing air hunger/dyspnea.    #Hypoxic Respiratory Failure, stable: Likely secondary to underlying COPD with ?pulmonary edema and reaccumulating pleural effusion from HHT.   CT chest with possible multifocal opacities concerning for PNA.  Query possible anxiety component as well. Minimal subjective improvement after thoracentesis.      - 24h I/O net positive despite increase in lasix  yesterday with associated AKI now precluding further medical management  - repeat thoracentesis with IR  - if no improvement, would broaden abx to cover HCAP  - planning for [place] shunt prior to consideration of pleurodesis  - IV albumin 25% BID until serum albumin > 3  - wean O2 as tolerated  - minimal emphysematous changes seen on CT, cont advair/spiriva and encourage PRN albuterol use    #Acute kidney injury: Rising Cr with minimal UOP. Concern for developing HRS.   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 HCAP vs. norovirus infection and now at baseline.   - cont lactulose, titrate for 3-4 soft bowel movements a day and rifaximin  * Ascites: diuretics held given AKI   * EV's: S/p TIPS, no longer needs surveillance with patent TIPS and global patient stability   * HHT: RUQ U/S 3/1 with patent TIPS.    -planning for [place] shunt and if no improvement will consider pleurodesis  -thoracentesis PRN with VIR   * Non-occlusive portal vein thrombosis: Multiple outpatient notes indicate poor medication adherence/knowledge, remains fall risk as well. Risks outweigh benefits  * [place] surveillance: Last CT on 06/27/2014 showed no HCC.  Negative RUQ u/s 10/2014.  Repeat imaging in May 2016.  * Transplant: Age >70, not a candidate    # Sepsis/abdominal pain:  Previously tx with broad-spectrum abx for HCAP. Possible UTI and multifocal PNA on imaging.   Retained oral contrast on CT suggestive poor motility.     - Vanco/zosyn discontinued (02/23-2/28), broaden from CTX if no improvement in resp status  - repeat thoracentesis, send for  full pleural studies to rule out exudative effusion or infection  - cont supportive care for norovirus    # Afib w RVR: Rate controlled on diltiazem.  CHADS2=2, CHADS2-VASC=4. High risk of fall due to HE, increased risk of coagulopathy given cirrhosis.   - hold anticoagulation given risks outweigh benefits of therapy  - cont rate control    Patient discussed with Dr. Silverio Decamp, MD  [facility]

## 2014-10-12 NOTE — Plan of Care (Signed)
Problem: Discharge Planning  Goal: Participation in care planning  Outcome: Met  Pt. Is cooperative and understands her care such as glucose monitoring and medication regime. Family also involved in pt. care planning. No discharge orders at this time.     Problem: Falls, Risk of  Goal: Keep patient free from falls utilizing universal fall precautions  Outcome: Met  Pt and pts. family educated regarding fall risk precautions and to call staff for assistance, verbalized understanding.   Non-skid socks on, bed alarm on, bed in lowest and locked position, side rails up x3, call light within reach.      Problem: Tissue Perfusion, Cardiopulmonary - Altered  Goal: Hemodynamic stability  Outcome: Met  JVD noted on left side of neck when HOB elevated. Pulses palpable. Skin moist and pink. Capillary refill <3 sec.          Problem: Pain - Acute  Goal: Communication of presence of pain  Outcome: Met  Pian levels assessed, pt. did not complain of any pain. Family informed to report pain to nurse if it presents.     Problem: Alteration in Blood Glucose  Goal: Glucose level within specified parameters  Intervention: Blood glucose monitoring  Blood glucose reading 288. Given six Units of Lispro. Will monitor for S&S of hypo/hyperglycemia.      Problem: Infection  Goal: Absence of infection signs and symptoms  Intervention: Thermoneutral temperature maintenance  Assessed temperature 36.6 C at 2045. Scheduled dose of antibiotics given. Will continue to monitor for infection.       Problem: Skin Integrity- Intact patient high risk for impaired skin integrity Braden scale less than or equal to 18  Goal: Skin remains intact  Outcome: Met  Pt turned a repositioned Q2H and prn. Pt on specialty air mattress, heels elevated offloaded on pillows. No pressure related skin issues noted. Pt cleansed promptly after episodes of incontinence.    Problem: Aspiration, Risk of  Goal: Evidence of swallowing difficulties is absent  Intervention:  Maintain upright position during oral intake, and for 45 minutes after oral intake  HOB elevated >45 degrees when given sips of water. Swallow reflex present, no difficulty swallowing noted. Some coughing noted. Pt. Maintained upright before/after fluids were given.       Problem: Breathing Pattern - Ineffective  Goal: Respiratory rate, rhythm and depth return to baseline  Outcome: Met  Pt is not experiencing difficulty breathing or short of breath. Pt is on 2L Nasal Cannula, Sat. at 96-97%   Pt and pts family informed to notify staff if breathing becomes difficult or pt short of breath, verbalized understanding to instructions.     Problem: Nutrition Deficit  Goal: Adequate nutritional intake  Outcome: Not Met  Pt with poor appetite, encouraged nutritional intake but pt family gave pt. one Glucerna supplement, pt. tolerated well.

## 2014-10-12 NOTE — Interdisciplinary (Signed)
ICU doctor here at bedside to eval pt. And spoke with the dtr/son and husband.

## 2014-10-12 NOTE — Plan of Care (Signed)
Scanner at bedside computer has been not working properly since the beginning of the shift this am. Several trouble shooting made. No extra computer and scanner available. Help desk was called and waiting to be fixed. Manually documented for most medications administration so far today. using 2 identifiers and 5 rights.

## 2014-10-12 NOTE — Interdisciplinary (Signed)
Clinical Nutr Kcal Count Results:   Kcal Count note for day 1. Data from 10/11/2014.   Estimated needs per [place](50.5kg):952 kcal x 1.5-1.7= 1428-1618 kcal/day (28-32 kcal/kg). 61-76g protein (1.2-1.5g protein/kg) Fluids deferred to MD d/t diuresis   PO Diet Rx: Pureed+ Carb Ltd+ 2g Na + Glucerna TID   PO intake: 939 kcal 53 grams Protein (2 meals recorded + 100% of 3 Boost Glucose Control supplements)   66% of low end energy needs met.   87% of low end protein needs met.   Will continue collecting kcal count data and RD to assess/summarize per policy.  Horris Latino, BS, NDTR

## 2014-10-12 NOTE — Progress Notes (Signed)
[facility] LOS: 19 days     Subjective/Interval History:  Still feels overall weak, not ambulating because she feels her legs are to weak.  Did not sit up in chair yesterday.  Breathing still an issue and feels short of breath.    Called later after fluid bolus given and patient complained of SOB.      Objective:    Vital Signs:   Temperature:  [97.4 F (36.3 C)-97.9 F (36.6 C)] 97.8 F (36.6 C) (03/03 0400)  Blood pressure (BP): (102-122)/(38-63) 110/49 mmHg (03/03 0400)  Heart Rate:  [75-82] 82 (03/03 0400)  Respirations:  [16-23] 17 (03/03 0400)  Pain Score:  [-] Patient Sleeping, Respiratory Assessment Done (03/03 0400)  O2 Device:  [-] Nasal cannula (03/03 0200)  O2 Flow Rate (L/min):  [2 l/min-3 l/min] 2 l/min (03/03 0200)  SpO2:  [93 %-100 %] 98 % (03/03 0400)    Recent Labs      10/11/14   1143  10/11/14   1721  10/11/14   2203  10/12/14   0757   GLUCPOCT  303*  334*  288*  232*     Wt Readings from Last 1 Encounters:   10/12/14 49.1 kg (108 lb 3.9 oz)     03/02 0600 - 03/03 0559  In: 1150 [P.O.:947; I.V.:203]  Out: 112 [Urine:112]    Physical Exam:  Thin appearing female in NAD  Neck supple  Lungs CTAB with slight decrease on R  CV RRR  Abdomen soft non distended +BS  Ext no edema  aox 3    Laboratory data:   Lab Results   Component Value Date    NA 129* 10/12/2014    K 3.9 10/12/2014    CL 96* 10/12/2014    BICARB 13* 10/12/2014    BUN 44* 10/12/2014    CREAT 2.31* 10/12/2014    GLU 230* 10/12/2014    [place] 8.9 10/12/2014     Lab Results   Component Value Date    WBC 13.2* 10/12/2014    HGB 8.1* 10/12/2014    HCT 23.8* 10/12/2014    PLT 94* 10/12/2014    SEG 88* 10/12/2014    LYMPHS 3* 10/12/2014    MONOS 8 10/12/2014     Lab Results   Component Value Date    AST 20 10/11/2014    ALT 16 10/11/2014    ALK 118 10/11/2014    TBILI 3.19* 10/11/2014    TP 6.5 10/11/2014    ALB 3.8 10/11/2014     Lab Results   Component Value Date    INR 2.1 10/12/2014       Assessment and  Plan:  55F with HCV (GT2, SVR), cirrhosis (c/b ascites, HE, EV) 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     # acute respiratory failure: 2/2 increased pleural effusion. Had improvement after thoracentesis on 2/26 and 2/29. Has not responded to iv diuresis and has now developed AKI. Will consult IR for repeat thoracentesis, cont nebs prn, try low dose ativan prn for dyspnea if not desaturating. Repeat cxr ordered.  Overall prognosis poor for pt and little options for improving hepatic hydrothorax. TIPS appears to be patent but has not helped reduce reaccumulation of fluid. Hepatology met with family on 3/1 to discuss treatment options and goals of care. Family wants to cont pursuing interventions that may  help and are not ready for hospice. Will consult CT surgery to eval if pt is candidate for pleurodesis but will need improvement in renal function and infectious work up completed prior.  --IR to perform another thoracentesis if possible  --does not appear to have large component of pulm edema on imaging  --may have component of anxiety as O2 sats stable on O2.  Does appear to have component of respiratory acidosis on her metabolic acidosis  --start broad coverage for HAP  --improved after increase in O2, will continue to monitor, no signs of accessory muscle use or tiring, can repeat ABG if breathing worsens    # AKI: suspect 2/2 diuresis. Recheck ua, urine lytes. Received lasix/aldactone yesterday but will hold further doses. Cont scheduled albumin.  --start midodrine and octreotide  --500 cc bolus NS given    #possible sepsis: prior infections have been treated but meets criteria w/intermittent tachycardia, tachypnea, leukocytosis though no new infectious sx   - completed vanc zosyn x6d for HCAP, poss SBP, stopped 2/28  - f/u pleural fluid cultures; resend pleural fluid for analysis with repeat thoracentesis -> fluid  transudative, improved wbc  - f/u repeat urine culture  -sozyn for suspected UTI    #AMS: multifactorial, contribution of HE + infection with norovirus. Improved   -continue lactulose, rifaximin   -frequent re-orientation, day/night cycle   -avoid sedating meds    #HCV cirrhosis: status post TIPS (06/2014) with recent revision no 09/14/14. Abdominal ultrasound on admission shows patency of vessels on 2/16 and 3/1.   --Hepatic encephalopathy: continue lactulose/rifaximin, Improving. defer downsizing of TIPS given that downsizing will likely lead to recurrent re accumulating HHT.   --HHT: cont diuretics, last thoracentesis done 2/26 and 2/29 with 1L removed. 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   --Ascites: lasix 20/aldactone 100 -> gave addl lasix 40 mg iv for dyspnea. S/p TIPS.   --HCC: CT 06/2014 without lesions c/f HCC, repeat due in May.     #Afib: Initially in RVR but HR now better controlled on oral diltiazem  -continue diltiazem, change to ER once dose determined   -Previous team had extensive discussion with patient and family regarding risk/benefits of anticoagulation and family wanted to pursue [facility] with xarelto however will defer beginning ac at this time given patient is at a high fall risk in setting of acute encephalopathy. Patient and family expressed agreement with this plan and discussion regarding ac will be revisited pending improvement in her encephalopathy.     #norovirus: resolved.    #DM2: not well controlled, appreciate endocrine recs  --cont lantus 32 units and 12 units lispro qac and 10 units for glucerna per endocrine recs  --high intensity SSI     #Osteoporosis  --Continue home calcium and vitamin D    #GERD:   --Continue pepcid 20 mg daily    # moderate malnutrition: cont glucerna. appreciate nutrition recs.     VTE PPx: SQ heparin   CODE : Full Code  DISPO: Pending clinical improvement

## 2014-10-12 NOTE — Interdisciplinary (Signed)
Report given to ICU nurse to Donna,rn.pt. Going to ccu #3 via bed.Family informed for the transfer.

## 2014-10-12 NOTE — Plan of Care (Signed)
After NS 530ml IV bolus x 1 hour, pt c/o difficulty to breathe. Dr.Ramos paged. MD at bedside. MD asked to turn off O2. O2 sat dropped to low  80% shortly. Non-rebreathe mask applied. O2sat went up to mid 90s. Paged RT for stat ABG. Will continue to monitor.

## 2014-10-12 NOTE — Progress Notes (Addendum)
Medical ICU   Accept Note    Patient: Paula Manning Day:   19 days - Admitted on: 10/03/2014     Location: 1113/1113    ID: Paula Manning is a 74 year old female with a history of HCV-2 cirrhosis c/b ascites, EV, and HE s/p TIPS (06/28/14 with revision 09/14/14 for pleural effusion), DM, and HTN who initially presented on 2/13 with AMS in the setting of recent UTI and was found to have diarrhea 2/2 Norovirus. Has diuretic-refractory hepatic hydrothorax for which she has been given serial thoracenteses (last done 2/29), now being transferred to the ICU due to ongoing dyspnea and progressive hypoxia.    SUBJECTIVE: Patient is Spanish-speaking only and was unable to follow commands or respond to questions. Her daughter and husband provided the history. Patient has had progressive shortness of breath over this hospitalization 2/2 increasing pulmonary edema and R pleural effusion. Has been continued on lasix/spironolactone and had multiple thoracenteses (2/16, 2/22, 2/27, 2/29), each draining ~1L. Initially had good symptomatic relief after thoras, but had minimal subjective improvement after her most recent thoracentesis (2/29, 1.3L drained). For the past three days, had had increasing dyspnea and oxygen requirement, now on 10L NC. Per daughter, also appears more confused and disoriented, intermittently unable to follow commands. At baseline prior to this hospitalization, patient was living at home with her husband and did require thoracentesis about once every couple of months.    OBJECTIVE:  Vital Signs:   Latest Entry  Range (last 24 hours)    Temperature: 97.4 F (36.3 C)  Temp  Avg: 97.4 F (36.3 C)  Min: 97.2 F (36.2 C)  Max: 97.8 F (36.6 C)    Blood pressure (BP): (!) 119/39 mmHg  BP  Min: 109/45  Max: 136/39    Heart Rate: 80  Pulse  Avg: 83.8  Min: 78  Max: 94    Respirations: 19  Resp  Avg: 19.6  Min: 13  Max: 24    SpO2: 96 %  SpO2  Avg: 95.6 %  Min: 84 %  Max: 100 %       No Data  Recorded     Weight - scale: 49.1 kg (108 lb 3.9 oz)  Percentage Weight Change (%): -1.8 %  Blood pressure (BP): (!) 119/39 mmHg  Heart Rate: 80  SpO2: 96 %    Intake/Output       10/11/14 0600 - 10/12/14 0559 10/12/14 0600 - 10/13/14 0559      0600-1759 4332-9518 Total 0600-1759 8416-6063 Total       Intake    P.O.  660  287 947  287  40 327    I.V.  200  3 203  1056  -- 1056    Total Intake [id number] 1343 40 1383       Output    Urine  80  32 112  40  10 50    Total Output 80 32 112 40 10 50       Net I/O     [phone] 1303 30 1333        Physical Exam:  General: elderly appearing woman with generalized jaundice, wearing NC. Speaks intermittently in short sentences. No accessory muscle use.   HEENT: EOMI, PERRL, moist mucous membranes  CV: RRR  Pulm: crackles bilaterally, most prominent in LUL  Abdomen: tense ascites, non-TTP, bowel sounds present  Ext: 4 mm pitting edema bilaterally from ankles  to sacrum  Skin: multiple ecchymoses on bilateral forearms with mepilex covering wound on L forearm  GU: foley catheter intact    Current Antibiotics:   IV vancomycin (2/23-2/28, restarted 3/3-)  IV zosyn (2/23-2/28, restarted 3/3-)    Continuous Medications:   sodium chloride       Scheduled Medications:   albumin human  25 g BID    cholecalciferol  400 Units Daily    diltiazem  180 mg Daily    famotidine  20 mg Daily    insulin glargine  32 Units Daily    insulin lispro  1-10 Units 4x Daily WC    insulin lispro  10 Units TID WC    insulin lispro  12 Units TID AC    lactulose  20 g BID    midodrine  10 mg TID    multivitamin  5 mL Daily    octreotide  200 mcg TID    piperacillin-tazobactam (ZOSYN) IVPB  3,375 mg Q12H    rifaximin  550 mg Q12H    sodium chloride (PF)  3 mL Q8H    tiotropium  1 capsule Daily    VANCOMYCIN PER PHARMACY   As Directed     PRN Medications:   albuterol  2.5 mg PRN    benzonatate  100 mg TID PRN    dextrose  12.5 g PRN    glucagon  1 mg Once PRN    glucose  4 tablet  PRN    glucose  1 Tube PRN    guaiFENesin-dextromethorphan  10 mL Q4H PRN    lidocaine  5 mL Once PRN    LORazepam  0.5 mg Daily PRN    meclizine  25 mg TID PRN    nalOXone  0.1 mg Q2 Min PRN    prochlorperazine  10 mg Q6H PRN    simethicone  80 mg Q6H PRN    sodium chloride (PF)  10 mL PRN    sodium chloride (PF)  3 mL PRN    sodium chloride   Continuous PRN    traMADol  50 mg Q6H PRN         Labs:   Recent Labs      10/12/14   1330   ARTPH  7.26*   ARTPCO2  35*   ARTPO2  62*     Recent Labs      10/10/14   0638  10/11/14   0452  10/12/14   0446   WBC  13.5*  14.6*  13.2*   HGB  9.6*  8.7*  8.1*   HCT  28.2*  25.5*  23.8*   PLT  136*  139*  94*     Recent Labs      10/10/14   0638  10/11/14   0452  10/12/14   0659   NA  130*  131*  129*   K  4.2  3.9  3.9   CL  100  97*  96*   BICARB  16*  12*  13*   BUN  30*  36*  44*   CREAT  1.04*  1.44*  2.31*   GLU  220*  256*  230*   [place]  8.2*  8.8  8.9   PHOS  3.8  5.2*  5.6*   MG  2.3  2.7*  3.0*   TP  6.0  6.5   --    ALB  2.7*  3.8   --  AST  23  20   --    ALT  16  16   --    ALK  137  118   --    TBILI  2.95*  3.19*   --        Microbiology:  Pleural fluid culture (10/08/2014): NGTD  Urine culture (10/11/14): NGTD    Imaging:  3/2 CT chest  1. Multifocal consolidations within the bilateral upper lobes, consistent with  multifocal pneumonia.    2. Large right and moderate left pleural effusion.    3. Please see the report for the concurrent CT abdomen/pelvis for findings   below the diaphragm.    3/2 CT abdomen w/o contrast  1. Significant liquid and gaseous distension of the large bowel, with   high-density material which may represent previously ingested contrast   previous exam, which would be unusual for patient on lactulose. In addition,   if this is residual oral contrast from the prior study, this indicates very   slow transit as the oral contrast is present within the colon 8 days ago.    2. Multiple cysts seen along the pancreas. Continued attention on  follow up.    3. Worsening bilateral pleural effusions, right greater than left. Patchy left  basal consolidation is seen. Please see concurrently acquired CT chest for   findings above the diaphragm.    Note that assessment of solid organs is limited in the absence of intravenous   contrast.    ASSESSMENT/PLAN: Paula Manning is a 74 year old female with a history of HCV-2 cirrhosis c/b ascites, EV, and HE s/p TIPS (06/28/14 with revision 09/14/14 for pleural effusion), DM, and HTN who initially presented on 2/13 with AMS in the setting of recent UTI and was found to have diarrhea 2/2 Norovirus. Has diuretic-refractory hepatic hydrothorax for which she has been given serial thoracenteses (last done 2/29), now being transferred to the ICU due to ongoing dyspnea and progressive hypoxia.    # Acute renal failure with metabolic acidosis: Cr 8.18 from 1.44 yesterday, concerning for HRS.   - Check lactate  - Consult nephrology to start dialysis  - Place vas cath in AM prior to iHD  - Will reverse INR with 2 units FFP, DDAVP, and Vitamin K pre-procedure    # Acute hypoxic respiratory failure: 2/2 recurrent hepatic hydrothorax. Performed thoracentesis this evening (3/3) with ~1L serosanguinous output and some symptomatic improvement. Patient appears to have less labored breathing, still on 10L NC.   - Transfer to ICU   - NPO  - Start BiPAP (set at 12/5)  - Family has consented for intubation/mech ventilation  - Repeat CXR and ABG   - Thoracentesis done this evening  - f/u pleural fluid studies   - Continue IV albumin 25% BID until serum albumin > 3  - Continue Advair/Spiriva and Albuterol PRN  - Continue IV vanc/zosyn (2/23-2/28, restarted 3/3-)    # AMS: per daughter, has ongoing confusion and disorientation, likely 2/2 respiratory failure and possibly also due in part to delirium of hospitalization.  - Per hepatology note, deferring previous plans to downsize TIPS given several infectious precipitants and TIPS  downsizing will increase rate of pleural effusion accumulation  - Manage respiratory failure as above    # Abdominal tinkling: noted on PEX, no abdominal pain today. Had recent Norovirus diarrhea and abdominal pain with colonic ileus on imaging.  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 MELD score   - Consult hepatology  - HE: lactulose, titrate to 3-4 BM/day + rifaximin  - Ascites: IV lasix 20 mg qdaily + spironolactone 100 mg qdaily  - EV: s/p TIPS  - HHT: s/p TIPS and revision  - Portal vein thrombosis, non-occlusive: anticoagulation relatively contraindicated given medication non-adherence and fall risk  - HCC surveillance: CT 06/27/14 negative for [place]. Due for imaging in May 2016  - Transplant: not candidate    # Recent UTI: no growth on repeat cultures  - IV zosyn as above  - f/u urine culture    # Afib:   - Continue diltiazem  - Anticoagulation relative contraindication as above    # DM2: not well controlled. On Lantus 32 units and 12 units lispro qac and 10 units for glucerna per endocrine recs.  - Decrease to lantus 16 units + moderate intensity sliding scale given NPO    F: NPO  A: Tramadol  S: None  T: ICDs H: >30 degrees  U: Famotidine  G: Insulin, hold mealtime insulin while NPO  S: Ecchymoses on bilateral forearms, titrate lactulose to 3 BM/day     Code: Full code, full resuscitation    Patient Lines/Drains/Airways Status    Active PICC Line / CVC Line / PIV Line / Drain / Airway / Intraosseous Line / Epidural Line / ART Line / Line Type / Wound     Name: Placement date: Placement time: Site: Days:    PICC Double Lumen - Power PICC Right Brachial 10/12/14  1350  Brachial  less than 1    Indwelling Urinary Catheter -  09/25/2014 1150 RN Standard (Latex) 10 ml Yes 09/16/2014  1150  Standard (Latex)  3    Impaired Skin Integrity -  Skin tear Arm Left 10/05/14  0800  Arm  7                Patient was seen  and discussed with the attending physician, Dr. Cristal Deer, Demosthen* who agrees with my assessment and plan.    Hessie Knows, PGY-1  Internal Medicine  Pager [phone]

## 2014-10-12 NOTE — Procedures (Addendum)
Paula Manning is a 74 year old female patient.    ICD-10-CM ICD-9-CM   1. Hepatic encephalopathy K72.90 572.2   2. Other cirrhosis of liver (HCC) K74.69 571.5   3. Diabetic polyneuropathy associated with type 2 diabetes mellitus (HCC) E11.42 250.60     357.2   4. Nausea and vomiting, unspecified intactability, vomiting of unspecified type R11.2 787.01   5. S/P TIPS (transjugular intrahepatic portosystemic shunt) Z95.828 V45.89   6. Portal vein thrombosis I81 452   7. Difficulty walking R26.2 719.7   8. Oropharyngeal dysphagia R13.12 787.22   9. Type 2 diabetes, uncontrolled, with neuropathy (HCC) E11.40 250.62    E11.65 357.2   10. Essential hypertension I10 401.9   11. Acute respiratory failure with hypoxia (HCC) J96.01 518.81   12. 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 Cirrhosis of liver with ascites, unspecified hepatic cirrhosis type (HCC) K74.60 571.5   14. Ileus of unspecified type (HCC) K56.7 560.1   15. AKI (acute kidney injury) N17.9 584.9     Past Medical History   Diagnosis Date    Migraine headache     Glaucoma     Ascites 06/29/2007     History of paracentesis x 2    Pancreatic cyst 06/29/2007    Cirrhosis ([place]) 06/24/2007     Active on the liver transplant waiting list with a blood type of O positive. 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 Due to Hep C    Osteopenia 06/24/2007    Emphysema 06/24/2007    EV (esophageal varices) ([place]) 06/10/2007    Anemia 06/10/2007    HCV (hepatitis C virus) 06/10/2007     Achieved SVR.    Type II or unspecified type diabetes mellitus with peripheral circulatory disorders, not stated as uncontrolled(250.70) 06/10/2007    HTN 06/10/2007    HTN 06/10/2007     Blood pressure 94/78, pulse 80, temperature 97.4 F (36.3 C), resp. rate 21, height 5\' 1"  (1.549 m), weight 49.1 kg (108 lb 3.9 oz), SpO2 96 %.    Thoracentesis  Date/Time: 10/12/2014 11:51 PM  Performed by: [name]  Authorized by: Richardson Chiquito  Consent: Verbal  consent obtained. Written consent obtained.  Risks and benefits: risks, benefits and alternatives were discussed  Consent given by: spouse  Patient understanding: patient states understanding of the procedure being performed  Patient consent: the patient's understanding of the procedure matches consent given  Procedure consent: procedure consent matches procedure scheduled  Relevant documents: relevant documents present and verified  Test results: test results available and properly labeled  Site marked: the operative site was marked  Imaging studies: imaging studies available  Patient identity confirmed: verbally with patient, arm band and hospital-assigned identification number  Time out: Immediately prior to procedure a "time out" was called to verify the correct patient, procedure, equipment, support staff and site/side marked as required.  Procedure purpose: therapeutic  Indications: pleural effusion  Preparation: Patient was prepped and draped in the usual sterile fashion.  Local anesthesia used: yes  Anesthesia: local infiltration  Local anesthetic: lidocaine 1% with epinephrine  Anesthetic total: 10 ml  Patient sedated: no  Preparation: skin prepped with Chloraprep  Patient position: left lateral decubitus  Ultrasound guidance: yes  Location: right posterior  Intercostal space: 9th  Puncture method: over-the-needle catheter  Needle gauge: 22  Catheter size: 6 [place]  Number of attempts: 1  Drainage amount: 1000 ml  Drainage characteristics: serosanguinous  Chest x-ray performed: yes  Chest x-ray interpreted by  me and other physician.  Chest x-ray findings: pleural effusion        [name]  10/12/2014      ATTENDING BEDSIDE PROCEDURE NOTE ATTESTATION    I was present throughout the procedure and assisted at key points.    Francesca Jewett, MD  Pulmonary and Critical Care  [place] [id number] / Pager [phone]

## 2014-10-12 NOTE — Consults (Signed)
NEPHROLOGY FELLOW INITIAL CONSULT NOTE    Date: 10/12/2014  Requested by: Dr. Cristal Deer  Reason: electrolyte imbalance, anuric, fluid overload      HISTORY OF PRESENT ILLNESS:  74 year old woman with h/o HCV cirrhosis s/p TIPS with recurrent hepatic hydrothorax and hepatic encephalopathy, HTN, DM Type II, admitted for altered mental status on 09/14/2014.  The patient was found with worsening respiratory status and acute renal failure.   She has been having worsening urine output since 10/10/2014.  She was admitted with serum Creatinine of 0.57 which was slowly rising since 10/05/2014 and nearly double today (compared to yesterday).  Chest Xray revealed increasing right pleural effusion.  Her BPs have been lower on the floor as well (SBP 80-90s).  The patient had multiple paracentesis procedures during this hospital admission.  The patient was transferred to the ICU this evening due to worsening respiratory failure and nephrology team was consulted to provided assistance with fluid removal. Thoracentesis was performed in the ICU today and 1.5L of fluid was removed.        PAST MEDICAL HISTORY:  Past Medical History   Diagnosis Date    Migraine headache     Glaucoma     Ascites 06/29/2007     History of paracentesis x 2    Pancreatic cyst 06/29/2007    Cirrhosis ([place]) 06/24/2007     Active on the liver transplant waiting list with a blood type of O positive. 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 Due to Hep C    Osteopenia 06/24/2007    Emphysema 06/24/2007    EV (esophageal varices) ([place]) 06/10/2007    Anemia 06/10/2007    HCV (hepatitis C virus) 06/10/2007     Achieved SVR.    Type II or unspecified type diabetes mellitus with peripheral circulatory disorders, not stated as uncontrolled(250.70) 06/10/2007    HTN 06/10/2007    HTN 06/10/2007       PAST SURGICAL HISTORY:  Past Surgical History   Procedure Laterality Date    Pb laps surg cholecstc w/expl common duct  1970s     in [place];  open CCX      Pb cesarean delivery only      Pb cstoplasty/cstourtp plstc any       Bladder suspension    Egd procedure  06/01/2013     Procedure: GI EGD;  Surgeon: Jeanette Caprice, MD;  Location: [facility];  Service: Gastroenterology;;    Tips procedure         SOCIAL HISTORY:  History     Social History    Marital Status: Married     Spouse Name: N/A    Number of Children: N/A    Years of Education: N/A     Occupational History    retired      Social History Main Topics    Smoking status: Former Smoker -- 10 years     Quit date: 11/13/2012    Smokeless tobacco: Never Used    Alcohol Use: No    Drug Use: No    Sexual Activity: Not on file     Other Topics Concern    Not on file     Social History Narrative       FAMILY HISTORY:  No family history on file.    REVIEW OF SYSTEMS:  Unable to obtain, the patient was altered.      OUTPATIENT MEDICATIONS:  No current facility-administered medications on file prior to encounter.     Current Outpatient  Prescriptions on File Prior to Encounter   Medication Sig Dispense Refill    alendronate (FOSAMAX) 70 MG tablet       ALPHAGAN P 0.1 % ophthalmic solution       bacitracin 500 UNIT/GM ointment Apply small amount to affected area on face twice daily 1 Tube 1    CALCIUM CARBONATE 600 MG OR TABS 1 tablet 2 times daily one month 11    ciprofloxacin (CIPRO) 500 MG tablet Take 1 tablet by mouth daily. 30 tablet 5    furosemide (LASIX) 20 MG tablet Take 2 tablets by mouth daily. 90 tablet 0    glipiZIDE (GLUCOTROL) 5 MG tablet Take 0.5 tablets (2.5 mg) by mouth 2 times daily. 30 tablet 1    insulin glargine (LANTUS) 100 UNIT/ML injection Inject 20 Units under the skin daily. 10 mL 1    lactulose 10 GM/15ML solution Take 30 mLs (20 g) by mouth 3 times daily. 946 mL 0    LUMIGAN 0.01 %       multivitamin (MULTI-VITAMIN) tablet Take 1 tablet by mouth daily.      ranitidine (ZANTAC) 300 MG tablet Take 300 mg by mouth 2 times daily.      rifaximin (XIFAXAN) 550 MG  tablet Take 550 mg by mouth every 12 hours.      sitagliptin (JANUVIA) 50 MG tablet Take 1 tablet (50 mg) by mouth daily. 60 tablet 1    spironolactone (ALDACTONE) 50 MG tablet Take 1 tablet (50 mg) by mouth daily. 30 tablet 0    VITAMIN D 400 UNIT OR CAPS 1 CAPSULE DAILY 30 11       INPATIENT MEDICATIONS:   albumin human  25 g BID    cholecalciferol  400 Units Daily    desmopressin (DDAVP) IVPB - Bleeding / Hemophilia  0.3 mcg/kg Once    diltiazem  180 mg Daily    famotidine  20 mg Daily    insulin glargine  32 Units Daily    insulin lispro  1-10 Units 4x Daily WC    insulin lispro  12 Units TID AC    [START ON 10/13/2014] lactulose  20 g TID    metoclopramide  5 mg Q12H    midodrine  10 mg TID    multivitamin  5 mL Daily    octreotide  200 mcg TID    phytonadione (VITAMIN K) IVPB  10 mg Once    piperacillin-tazobactam (ZOSYN) IVPB  3,375 mg Q12H    rifaximin  550 mg Q12H    sodium chloride (PF)  3 mL Q8H    tiotropium  1 capsule Daily    VANCOMYCIN PER PHARMACY   As Directed        ALLERGY:  No Known Allergies      OBJECTIVES:  VITAL SIGNS:  Temperature:  [97.2 F (36.2 C)-97.8 F (36.6 C)] 97.4 F (36.3 C) (03/03 2000)  Blood pressure (BP): (87-136)/(39-78) 94/78 mmHg (03/03 2205)  Heart Rate:  [78-94] 80 (03/03 2205)  Respirations:  [13-24] 21 (03/03 2205)  Pain Score:  [-] 0 (03/03 2205)  O2 Device:  [-] Nasal cannula (03/03 2205)  O2 Flow Rate (L/min):  [2 l/min-15 l/min] 10 l/min (03/03 2205)  SpO2:  [84 %-100 %] 96 % (03/03 2205)      03/02 0600 - 03/03 0559  In: 1150 [P.O.:947; I.V.:203]  Out: 112 [Urine:112]   Net: +1433 mL  Net since Admission: +18.8 L    Weight 49.1 kg ((50  kg yesterday)      PHYSICAL EXAM:  GEN: elderly, ill woman laying in bed, no in distress, not alert and oriented, awake  HEENT: normocephalic, scleral icterus  NECK: JVD  CARDIOVASCULAR: RRR, no murmurs, rubs, or gallops.    PULMONARY: Coarse breath sounds throughout  ABDOMEN: ND, soft, mild distended,+BS, NT,  no rebound tenderness, no guarding  UE: 2 mm pitting edema  LE: 5-6 mm pitting edema up to bilateral hips  SKIN: jaundice, bruises on arms      LAB STUDIES:  Recent Labs      10/10/14   0638  10/11/14   0452  10/12/14   0659   NA  130*  131*  129*   K  4.2  3.9  3.9   CL  100  97*  96*   BICARB  16*  12*  13*   BUN  30*  36*  44*   CREAT  1.04*  1.44*  2.31*   ALB  2.7*  3.8   --      8.2*  8.8  8.9   PHOS  3.8  5.2*  5.6*   MG  2.3  2.7*  3.0*       Lab Results   Component Value Date    AST 20 10/11/2014    ALT 16 10/11/2014    GGT 12 10/16/2004    LDH 272 09/19/2014    ALK 118 10/11/2014    TP 6.5 10/11/2014    ALB 3.8 10/11/2014    TBILI 3.19 10/11/2014    DBILI 1.3 09/25/2014       Recent Labs      10/10/14   0638  10/11/14   0452  10/12/14   0446   WBC  13.5*  14.6*  13.2*   HCT  28.2*  25.5*  23.8*   HGB  9.6*  8.7*  8.1*   PLT  136*  139*  94*   SEG  90*  91*  88*   MCV  98.9*  100.0*  98.8*       Lab Results   Component Value Date    COLORUA Amber* 10/11/2014    APPEARUA Slightly Hazy* 10/11/2014    GLUCOSEUA Negative 10/11/2014    BILIUA Negative 10/11/2014    KETONEUA Negative 10/11/2014    SGUA 1.026 10/11/2014    BLOODUA 2+* 10/11/2014    PHUA 5.0 10/11/2014    PROTEINUA 2+* 10/11/2014    UROBILUA Negative 10/11/2014    NITRITEUA Negative 10/11/2014    LEUKESTUA 2+* 10/11/2014    WBCUA 21-50* 10/11/2014    RBCUA 11-20* 10/11/2014    HYALINEUA >5* 10/11/2014    CRYSTALSUA AMORPHOUS 12/14/2002       ABG 7.26/35/62/16/90% on 10L NC  INR 2.1 PT 23.3      DIAGNOSTIC STUDIES:  CXR 10/12/2014:  Right PICC with tip projecting over the mid SVC.  Increasing right pleural effusion. Increasing parenchymal opacities particularly in the right upper lung and left lower lung which may represent worsening edema and/ or infection.  Trace left pleural effusion.    CT CHEST WITHOUT CONTRAST 10/11/2014:  1. Multifocal consolidations within the bilateral upper lobes, consistent with multifocal pneumonia.  2. Large right and  moderate left pleural effusion.  3. Please see the report for the concurrent CT abdomen/pelvis for findings   below the diaphragm.    CT ABDOMEN/PELVIS WITHOUT CONTRAST 10/11/2014:  1. Significant liquid and gaseous distension of the large bowel, with  high-density material which may represent previously ingested contrast previous exam, which would be unusual for patient on lactulose. In addition,   if this is residual oral contrast from the prior study, this indicates very   slow transit as the oral contrast is present within the colon 8 days ago.  2. Multiple cysts seen along the pancreas. Continued attention on follow up.  3. Worsening bilateral pleural effusions, right greater than left. Patchy left  basal consolidation is seen. Please see concurrently acquired CT chest for findings above the diaphragm.    RUQ [place] 10/10/2014:  Coarse nodular liver, ascites and large right pleural effusion consistent with  history of liver cirrhosis and portal hypertension.  The TIPS is patent with normal velocities, however there is nonspecific bidirectional flow in the right and left portal veins.  Status post cholecystectomy, with anechoic loculated fluid within the gallbladder bed, most likely related to the presence of ascites.      IMPRESSION:  74 year old woman with h/o HCV cirrhosis s/p TIPS with recurrent hepatic hydrothorax and hepatic encephalopathy, HTN, DM Type II, admitted for altered mental status on 09/14/2014.  Now, the patient is found to have anuric AKI and respiratory failure.  #Anuric AKI: due to sepsis  #Metabolic acidosis and respiratory acidosis  #Respiratory failure: hepatic hydrothorax and fluid overload  #HCV cirrhosis  #Sepsis    PLANS:  -Discussed with her family (her husband, daughter, and son-in-law) about her poor prognosis and dialysis modalities  -The family agreed with starting CRRT for one week for electrolyte, acid/base, and fluid management and will revisit goals of care  -Obtain consent for CRRT  or intermittent hemodialysis    -Will start CRRT with the following prescription:  Expected clearance: 45 mL/min   Access: vasc cath pending  Blood flow: 100 mL/min   Dialysate flow: 1 L/hr   UF rate: 1 L/hr   Prefilter fluid: NS at 500 mL/min   Postfilter fluid: NS at 200 mL/min   Total effluent: 2.7 L/hr   Anticoagulation: [place] citrate   iCa goals: Peripheral 0.95-1.05, Post-filter 0.35-0.45  Current citrate drip: 100 mL/hr   Current [place] drip: 50 mL/hr   Dialysate: Prismasate: 6 K, 2.5 Mg, 2.5 [place], 32HCO3 (mEq/L)   Replacement fluids: bicarb scale, midrange 18-24.  Fluid goal: net even for the first two hours and then -50 mL/hr.    -Please do not discontinue electrolyte repletion protocol  -Please do not alter CRRT orders with notifying the nephrology service.  -Sepsis management per ICU team.        ANH Lorrine Killilea, MD, MPH  Nephrology Fellow  Discussed with Attending Dr.  Cindie Laroche who agreed with the assessment and plan.

## 2014-10-12 NOTE — Plan of Care (Signed)
Report given to Derreck, Therapist, sports. Patient in no apparent distress, vital signs stable. RN aware of the patient plan of care, the patient's condition, labs, and treatment.

## 2014-10-12 NOTE — Interdisciplinary (Signed)
Dr. Roger Shelter on-call for the night notified re: pt. Declined in mentation very drowsy and needing higher O2 level.currently on 15 liters/nc from 5-6 liters.see flowsheet vs

## 2014-10-12 NOTE — Plan of Care (Signed)
Problem: Falls, Risk of  Goal: Keep patient free from falls utilizing universal fall precautions  Patient alert and oriented x 3. Bed in lowest, locked position. Side rails up x 2. Bed alarm on. Call light in reach and patient aware to call nurse for help when needed. Patient has non skid socks on. High fall risk sign on door, high fall risk clasp on ID. Family at bedside. Will continue to monitor and do hourly rounding.     Problem: Tissue Perfusion, Cardiopulmonary - Altered  Goal: Hemodynamic stability  Cardiac rhythm afib. Edema noted to right upper extremity. VSS.     Problem: Pain - Acute  Goal: Communication of presence of pain  Patient has no c/o pain at this time. She is aware to notify Rn for pain medication if needed or if pain is not tolerable. Will continue to monitor. See document flow sheet for reassessment.     Problem: Alteration in Blood Glucose  Goal: Glucose level within specified parameters  Blood sugar this am=200s, Insulin coverage for glucern increased. SSI given as ordered.  Will continue to monitor BS QAC and hs.     Problem: Infection  Goal: Absence of infection signs and symptoms  Patient afebrile at this time. Will continue to monitor.     Problem: Skin Integrity- Intact patient high risk for impaired skin integrity Braden scale less than or equal to 18  Goal: Skin remains intact  Patient has no pressure related skin issues noted. Patient turned and repositioned. Skin clean and dry. Boney prominences elevated. Protective ointment applied to buttocks/sacral area. Patient has skin tear noted to left upper extremity covered with mepilex dressing. Will continue to monitor and turn patient q 2 hours to prevent skin breakdown.     Problem: Aspiration, Risk of  Goal: Evidence of swallowing difficulties is absent  Head of bed up with all meals to prevent aspiration. Patient has no signs of aspiration during consumption of meal.     Problem: Breathing Pattern - Ineffective  Goal: Respiratory  rate, rhythm and depth return to baseline  Patient on 3 liters 02/nasal cannula. She continues to c/o sob at rest. 02 sat wnl.

## 2014-10-12 NOTE — Progress Notes (Signed)
Endocrine/Diabetes Progress Note    Overnight:  No acute events, remains hyperglycemic    Subjective: reports ongoing SOB, drinking mostly glucernas    Objective:   Temperature:  [97.2 F (36.2 C)-97.9 F (36.6 C)] 97.2 F (36.2 C) (03/03 0800)  Blood pressure (BP): (104-125)/(38-63) 121/57 mmHg (03/03 1000)  Heart Rate:  [75-85] 84 (03/03 1000)  Respirations:  [16-23] 20 (03/03 1000)  Pain Score:  [-] 0 (03/03 1000)  O2 Device:  [-] Nasal cannula (03/03 1000)  O2 Flow Rate (L/min):  [2 l/min-3 l/min] 2 l/min (03/03 1000)  SpO2:  [93 %-100 %] 100 % (03/03 1000)  Body mass index is 20.46 kg/(m^2).    GEN: NAD, chronically ill appearing  PULM: breathing comfortably    Diet: pureed carb limited + glucerna TID    Blood Sugars reviewed:        AM  Lu  Di  HS  O/N    2/28  114 162 180 159  Lantus   35  Lispro  8+6 8+3 6+3    2/29  88 113 231 213  Lantus   28  Lispro  12+8  4+6 +1    3/1  227 265 329 251  Lantus   28  Lispro  8+6 6+8+8 6+8+10  6    3/2  300 303 334 288  Lantus   32   Lispro  8+6 10+8 +10 +6    3/3  232 271  Lantus   32  Lispro  10+6     A/P:  Emmely Bittinger is a 74 year old female with uncontrolled DM2 c/b neuropathy as well as HepC cirrhosis, HTN, glaucoma and migraines admitted 09/16/2014 w/ n/v and AMS.     BG trend remains high, drinking mostly glucernas will increase glucerna coverage    Inpatient Recs: paged to first call  - cont lantus  32 units qday  - cont Lispro 12 units qac meals (RN to give 0, 1/2, whole dose based on % tray consumed)  - inc Lispro 8-->10 units per can of Glucerna once diet advances (RN to give 0, 1/2, whole dose based on % tray consumed)  - continue high Lispro correction scale q ac/hs    Outpatient Recs:  Was on Lantus 20 units qhs, Januvia 50 mg qday and glipizide 2.5 mg bid w/ A1C 6.2%, but home BG 200-400s in setting of OJ-carrot juice and ++rice/beans intake. Final doses TBD based on inpatient needs.    will cont to follow

## 2014-10-12 NOTE — Interdisciplinary (Signed)
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 Confusion associated with poor medication compliance, including lactulose, N/V, abd pain, diarrhea, and f/c/s, now attributed to norovirus. Pt w/ recent UA consistent with UTI. Not tx candidate for age.  Interval Events: S/p thora -1.3 L(3/1), -1L(2/26), -1 L(2/22). HE improved (continue lactulose,goal 3-4 soft BM/d), diuretics for ascites held given AKI/ suspecting HRS. Increased Cr with decreased UOP, nonresponsive to fluid bolus and worsening O2 requirements. MELD 21/MELD-Na 25. Ongoing family discussion regarding GOC. Per SLP 3/2, pt still w/mild dysphagia significantly impacted by generalized weakness. Per RN POC 3/2, pt continues to refuse NG tube insertion (pt stating she will increased food consumption), however order placed 3/2 to insert keofeed.    F/u wt (61"): 108# today @ 103% of 105# IBW. Stable wt range since admit:  Trending down slightly from previous stable range of 111-113#. Will continue basing needs on previous low wt since admit on 2/18 @111  lbs (50.5 kg,106% IBW of 105 lbs. BMI: 21), however will continue to monitor trends. Noted per EPIC review, pt ~112# x3 months ago (no loss), and 118# x6 months ago (pt reports UBW of 118#). For additional wt Hx, see 2/16 note.   Diet Rx: Pureed, Carb Limited, 2gm Sodium Diet + Glucerna TID   GI: LBM 3/3 x 2 so far (loose, brown, large). Noted stool ranging from 1-2x/d since last f/u, trended down from 5. Norovirus detected 2/14, c.diff negative 2/22. Abd: less tense, NT, BS+ per MD note 3/2. Abd distended, incontinent per RN head to toe assessment 3/3. Continues on pureed, thin  liquids d/t mild dysphagia per SLP eval 3/2.   PO intake: Pt with continued poor PO intake. Percentage of plate items taken has remained stable since last F/U, but total calorie intake has increased with the addition of Glucerna.   Past 2 days: Pt consuming 39% x 5 meals over two days plus ~2-3 Boost Glucose Control supplements/d. Avg estimated intake x 2 days: 1276kcal (89% low end needs), 70g protein(115% low end needs). However, intake via Calorie Count x1 day: 66% of low end energy needs met, 87% of low end protein needs met. Half of pt's calorie/protein intake is via Glucerna. Noted pt only completed one tray item this morning, 1 Glucerna, and 0% of lunch today (lunch and multiple untouched supplements and snacks at bedside at visit this afternoon). Pt refusing all food options, mildly agreeable only to applesauce.     I/O: 1111m /1763m(+1BRP), (+99347m net +18.2L since admit (adequacy deferred to MD).   Labs: Na 129/corrected (trend down), BUN 44/Cr2.31 (both trend up), GFR 21(trend down), Glu 230, POCBS 232-271, T Bili 3.19 trends up(3/2), (ALT/AST WNL), K+ WNL, Phos 5.6/Mag 3 (both trend up), WBC 13.5 (trends down), Vit D(25-OH) 22 (06/2014), Vit A 0.22(06/2014), quantitative Zn 643/WNL (06/2014), A1C 6.2 (2/13)  Meds: MOA, cholecalciferol 400 units daily, pepcid, Lantus 32 units daily (up from 28 units at last f/u), Lispro 10 units TID (down from 12 units), Lispro SSI QID, Lispro 12 units TID, lactulose 20g BID, MVT, zosyn, rifaximin, mylicon prn, NaCl completed 3/3, ultram prn, vancomycin, compazine prn.   Skin: dry, warm, ecchymosis. Skin tear to L arm  per RN head to toe assessment 3/3  Edema: Pitting +3 to BLE, nonpitting to RUE per RN head to toe assessment 3/3.   NFPE 3/3: (visual, limited) agree with most recent RD observations.   RD 2/23: Severe fat loss to orbital and thoracic region, moderate to severe fat loss to upper arm region. Severe muscle loss to temple area, clavicles are protruded,  moderate to severe loss to acromion region, moderate loss to quadriceps, patellar region and gastrocnemius muscle. Poor nail perfusion.     Estimated needs per [place](50.5kg):952 kcal x 1.5-1.7= 1428-1618 kcal/day (28-32 kcal/kg). 51-61g protein (1-1.2 g protein/kg) / given HRS. Fluids deferred to MD.     D: Severe malnutrition r/t chronic catabolic illness AEB pt w/ESLD, ~18.8% wt loss x 1.5 years and severe fat and muscle loss noted in physical exam findings.     I: GOAL(2/26): Pt to meet >75% of estimated needs with acceptable tolerance by next f/u (met, 77% of calorie needs, 99% of protein needs)  GOAL (3/1): Pt to meet 100% of low end estimated needs with acceptable tolerance (not met , 66% calories,  87% proteins)   GOAL (3/3): Pt to meet 100% of low end estimated needs with acceptable tolerance     Plan/Recs:   1- Rec to continue current diet order. Will apply renal restrictions once po intake improves.   2- Given current labs, rec to d/c glucerna and start vanilla Nepro TID: 1280 kcal (89% of low end kcal needs and, 57 g protein(100% of needs).    3- Rec to start phos binder  4-  If post pyloric keofeed placed, REC to start noct TF : TF Rx: Suplena @ 40 mL/hr x 12 hrs: 480 mL, 864 kcal (60% of low end needs), 39 g protein (76% low end needs).    5- Rec continue to adjust insulin per endocrine with goal of POCBS <190m/dL  6- Highly rec to increase cholecalciferol to 5000 units. Will monitor the need to switch calcitriol  7- Rec to continue MVT  8- Rec to continue to weigh daily to monitor trends    Nutrition POC(coordination of care): paged to MD Ramos @ [phone] Education: when appropriate, no family at bedside this afternoon.    Discharge: pending clinical course   RD to monitor/evaluate PO intake/dequacy, GI condition, labs, wt trends, skin condition, and will f/u per policy    JLoma Sousa Dietetic Intern    Reviewed and edited above note, agree with current POC  Will continue to f/u per policy  ARochele Pages MS, RD pager # [phone]

## 2014-10-12 NOTE — Progress Notes (Addendum)
ATTENDING CRITICAL CARE PROGRESS NOTE ATTESTATION    Subjective  74 y/o F with HCV cirrhosis s/p TIPS with recurrent hepatic hydrothorax and hepatic encephalopathy, now with worsening Sx in hospital since middle of February and now acute respiratory failure and acute renal failure.    I have reviewed the history.  Interval history:  Moved to ICU on higher FiO2 needs, will get Bipap for WOB, worse metabolic acidosis and anuric, Renal seeing pt to start CRRT, getting Thora for re-accumulating R sided effusion.    Objective  NAD, encephalopathic, Irr-Irr, decreased BS on R, mild T mild D, +2 BLEx edema to thighs.  I have examined the patient and concur with the resident exam.    Assessment and Plan  74 y/o F with HCV cirrhosis s/p TIPS with recurrent hepatic hydrothorax and hepatic encephalopathy, now with worsening Sx in hospital since middle of February and now acute respiratory failure and acute renal failure.  1. Acute respiratory failure:  - Thora and CXR  - Bipap  - CRRT for volume management  - ABG  - Labs  2. AKI:  - Renal to start CRRT  - Appreciate help  - Vascath once INR improved  - Follow pH  3. HVC cirrhosis:  - Hepatology following  - Continue supportive Rx  - Lactulose, rifaximin  4. AFIB/RVR:  - Rate control  5. Coagulopathy:  - Worsening  - Reverse INR for Vascath  6. Pleural effusion:  - Drain in an attempt to improve WOB  7. Ileus:  - No need for NGT yet  - Reglan  - Follow    I agree with the resident care plan.  See the resident / fellow note for further details.    Critical Care Time evaluating, managing, providing care and documenting critical care activities = 40 minutes, during which I was immediately available to this patient. Time involved in the performance of separately reportable procedures was not counted toward critical care time.     Francesca Jewett, MD  Pulmonary and Critical Care  PID [id number] / Pager [phone]

## 2014-10-12 NOTE — Interdisciplinary (Signed)
MD spoke with patients daughter and son. Plan for transfer to higher level of care.

## 2014-10-12 NOTE — Procedures (Incomplete)
Paula Manning is a 74 year old female patient.    ICD-10-CM ICD-9-CM   1. Hepatic encephalopathy K72.90 572.2   2. Other cirrhosis of liver (HCC) K74.69 571.5   3. Diabetic polyneuropathy associated with type 2 diabetes mellitus (HCC) E11.42 250.60     357.2   4. Nausea and vomiting, unspecified intactability, vomiting of unspecified type R11.2 787.01   5. S/P TIPS (transjugular intrahepatic portosystemic shunt) Z95.828 V45.89   6. Portal vein thrombosis I81 452   7. Difficulty walking R26.2 719.7   8. Oropharyngeal dysphagia R13.12 787.22   9. Type 2 diabetes, uncontrolled, with neuropathy (HCC) E11.40 250.62    E11.65 357.2   10. Essential hypertension I10 401.9   11. Acute respiratory failure with hypoxia (HCC) J96.01 518.81   12. 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 Cirrhosis of liver with ascites, unspecified hepatic cirrhosis type (HCC) K74.60 571.5   14. Ileus of unspecified type ([place]) K56.7 560.1     Past Medical History   Diagnosis Date    Migraine headache     Glaucoma     Ascites 06/29/2007     History of paracentesis x 2    Pancreatic cyst 06/29/2007    Cirrhosis ([place]) 06/24/2007     Active on the liver transplant waiting list with a blood type of O positive. 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 Due to Hep C    Osteopenia 06/24/2007    Emphysema 06/24/2007    EV (esophageal varices) ([place]) 06/10/2007    Anemia 06/10/2007    HCV (hepatitis C virus) 06/10/2007     Achieved SVR.    Type II or unspecified type diabetes mellitus with peripheral circulatory disorders, not stated as uncontrolled(250.70) 06/10/2007    HTN 06/10/2007    HTN 06/10/2007     Blood pressure 133/46, pulse 88, temperature 97.2 F (36.2 C), resp. rate 20, height 5\' 1"  (1.549 m), weight 49.1 kg (108 lb 3.9 oz), SpO2 97 %.    PICC Placement  Date/Time: 10/12/2014 3:45 PM  Performed by: Osborne Casco  Authorized by: Danton Sewer  Consent: Verbal consent obtained. Written consent obtained.  Risks and benefits: risks,  benefits and alternatives were discussed  Consent given by: spouse (bedside consent with patient's husband)  Patient understanding: patient states understanding of the procedure being performed  Patient consent: the patient's understanding of the procedure matches consent given  Procedure consent: procedure consent matches procedure scheduled  Relevant documents: relevant documents present and verified  Test results: test results available and properly labeled  Site marked: the operative site was marked  Imaging studies: imaging studies available  Patient identity confirmed: arm band, provided demographic data, hospital-assigned identification number and anonymous protocol, patient vented/unresponsive  Time out: Immediately prior to procedure a "time out" was called to verify the correct patient, procedure, equipment, support staff and site/side marked as required.  Indications: vascular access  Anesthesia: local infiltration  Local anesthetic: lidocaine 1% without epinephrine  Anesthetic total: 3 ml  Patient sedated: no  Pre-placement Checklist  Hand hygiene by all in room/procedure room: yes  CHG applicator used: yes   30 second scrub and 2 minute dry: yes  Operator wore: sterile gloves, mask with eye shield, sterile gown and hat  Maximum sterile barrier precautions: yes  Sterile field maintained during procedure: Sterile field maintained during procedure  Sterile technique maintained while applying dressing: Yes  CHG impregnated site patch placed: No  Dressing Date/Time: 10/12/2014 4:30 PM  Pre-procedure: landmarks identified  Preparation: skin prepped with chlorhexidine and sterile field established  Insertion side: right   Insertion site: brachial  Brand:Navilyst Francena Hanly (LOT# [id number])  Line type: PICC  Catheter tip location: SVC  Patient position: reverse Trendelenburg  Catheter type: double lumen  Ultrasound guidance: yes  Ultrasound guide placement: vessel located, needle entry and guide wire  removed  Indications for ultrasound: safety    Reason for insertion: new indication  Number of attempts: 1  Successful placement: yes  Estimated Blood Loss: 5 ml  Attending was not presentPost-procedure: stat-lock applied and dressing applied  Assessment: blood return through all parts and placement verified by x-ray  Complications: none  Follow-up: portable chest xray ordered and flush protocol by guideline    Patient tolerance: Patient tolerated the procedure well with no immediate complications  Comments: 36/0 right brachial vein          Osborne Casco  10/12/2014

## 2014-10-12 NOTE — Interdisciplinary (Signed)
Patient too tired and SOB . Family requesting PT try back later.   Returned twice , pt still too tired and saturation is 90-91 at rest in bed.   PT will try back tomorrow.

## 2014-10-12 NOTE — Plan of Care (Signed)
Problem: Discharge Planning  Goal: Participation in care planning  Outcome: Not Met  No discharge date yet.    Problem: Falls, Risk of  Goal: Keep patient free from falls utilizing universal fall precautions  Outcome: Met  No fall. Fall precaution enforced. Will continue to monitor.    Problem: Tissue Perfusion, Cardiopulmonary - Altered  Goal: Hemodynamic stability  Outcome: Met  Remain on a-fib with HR at 70-80's    Problem: Pain - Acute  Goal: Communication of presence of pain  Outcome: Not Met  Denies pain at this time.will continue to monitor.    Problem: Alteration in Blood Glucose  Goal: Glucose level within specified parameters  Outcome: Met  FS on 170's. No insulin given as per scale. Will continue to monitor.    Problem: Skin Integrity- Intact patient high risk for impaired skin integrity Braden scale less than or equal to 18  Goal: Skin remains intact  Outcome: Not Met  Multiple skin tears. mepilex in-place on BUE. Will continue to monitor.    Problem: Aspiration, Risk of  Goal: Evidence of swallowing difficulties is absent  Outcome: Met  No aspiration. Took neds with apple sauce. No aspiration observed. Will continue to monitor.    Problem: Breathing Pattern - Ineffective  Goal: Respiratory rate, rhythm and depth return to baseline  Outcome: Not Met  Short of breath on exertion and at times at rest. Pleural effusion on cxr today right side worse than left side.breathing treatment given with temp relief, oxygen increase usage tonight due to sats at 90-22 on nasal cannula 6 liters.at 10 liters now.sats 96%.will continue to monitor.    Problem: Nutrition Deficit  Goal: Adequate nutritional intake  Outcome: Not Met  Poor appetite.on calorie count.

## 2014-10-13 DIAGNOSIS — E8770 Fluid overload, unspecified: Secondary | ICD-10-CM

## 2014-10-13 DIAGNOSIS — R091 Pleurisy: Secondary | ICD-10-CM

## 2014-10-13 DIAGNOSIS — E872 Acidosis: Secondary | ICD-10-CM

## 2014-10-13 LAB — BASIC METABOLIC PANEL, BLOOD
Anion Gap: 17 mmol/L — ABNORMAL HIGH (ref 7–15)
Anion Gap: 20 mmol/L — ABNORMAL HIGH (ref 7–15)
BUN: 49 mg/dL — ABNORMAL HIGH (ref 6–20)
BUN: 36 mg/dL — ABNORMAL HIGH (ref 6–20)
Bicarbonate: 13 mmol/L — ABNORMAL LOW (ref 22–29)
Bicarbonate: 18 mmol/L — ABNORMAL LOW (ref 22–29)
Calcium: 7.9 mg/dL — ABNORMAL LOW (ref 8.5–10.6)
Calcium: 8.8 mg/dL (ref 8.5–10.6)
Chloride: 99 mmol/L (ref 98–107)
Chloride: 94 mmol/L — ABNORMAL LOW (ref 98–107)
Creatinine: 2.99 mg/dL — ABNORMAL HIGH (ref 0.51–0.95)
Creatinine: 2.35 mg/dL — ABNORMAL HIGH (ref 0.51–0.95)
GFR: 15 mL/min
GFR: 20 mL/min
Glucose: 76 mg/dL (ref 70–115)
Glucose: 126 mg/dL — ABNORMAL HIGH (ref 70–115)
Potassium: 4 mmol/L (ref 3.5–5.1)
Potassium: 3.3 mmol/L — ABNORMAL LOW (ref 3.5–5.1)
Sodium: 129 mmol/L — ABNORMAL LOW (ref 136–145)
Sodium: 132 mmol/L — ABNORMAL LOW (ref 136–145)

## 2014-10-13 LAB — CBC WITH DIFF, BLOOD
ANC-Automated: 11.3 10*3/uL — ABNORMAL HIGH (ref 1.6–7.0)
ANC-Manual Mode: 13.1 10*3/uL — ABNORMAL HIGH (ref 1.6–7.0)
ANC-Manual Mode: 10.6 10*3/uL — ABNORMAL HIGH (ref 1.6–7.0)
Abs Lymphs: 0.3 10*3/uL — ABNORMAL LOW (ref 0.8–3.1)
Abs Lymphs: 0.3 10*3/uL — ABNORMAL LOW (ref 0.8–3.1)
Abs Lymphs: 0.5 10*3/uL — ABNORMAL LOW (ref 0.8–3.1)
Abs Monos: 0.4 10*3/uL (ref 0.2–0.8)
Abs Monos: 1.3 10*3/uL — ABNORMAL HIGH (ref 0.2–0.8)
Abs Monos: 1 10*3/uL — ABNORMAL HIGH (ref 0.2–0.8)
Bands: 7 % (ref 0–15)
Bands: 1 % (ref 0–15)
Hct: 21.1 % — ABNORMAL LOW (ref 34.0–45.0)
Hct: 21.8 % — ABNORMAL LOW (ref 34.0–45.0)
Hct: 19.6 % — ABNORMAL LOW (ref 34.0–45.0)
Hgb: 7.2 gm/dL — ABNORMAL LOW (ref 11.2–15.7)
Hgb: 7.3 gm/dL — ABNORMAL LOW (ref 11.2–15.7)
Hgb: 6.7 gm/dL — CL (ref 11.2–15.7)
Imm Gran %: 1 % (ref <1)
Imm Gran Abs: 0.1 10*3/uL (ref <0.1)
Lymphocytes: 2 % — ABNORMAL LOW (ref 19–53)
Lymphocytes: 3 % — ABNORMAL LOW (ref 19–53)
Lymphocytes: 4 % — ABNORMAL LOW (ref 19–53)
MCH: 34.1 pg — ABNORMAL HIGH (ref 26.0–32.0)
MCH: 33.5 pg — ABNORMAL HIGH (ref 26.0–32.0)
MCH: 33.3 pg — ABNORMAL HIGH (ref 26.0–32.0)
MCHC: 34.1 % (ref 32.0–36.0)
MCHC: 33.5 % (ref 32.0–36.0)
MCHC: 34.2 % (ref 32.0–36.0)
MCV: 100 um3 — ABNORMAL HIGH (ref 79.0–95.0)
MCV: 100 um3 — ABNORMAL HIGH (ref 79.0–95.0)
MCV: 97.5 um3 — ABNORMAL HIGH (ref 79.0–95.0)
MPV: 10.5 fL (ref 9.4–12.4)
MPV: 10.3 fL (ref 9.4–12.4)
MPV: 10.3 fL (ref 9.4–12.4)
Monocytes: 3 % — ABNORMAL LOW (ref 5–12)
Monocytes: 10 % (ref 5–12)
Monocytes: 8 % (ref 5–12)
Number of Cells Counted: 117
Number of Cells Counted: 100
Plt Count: 86 10*3/uL — ABNORMAL LOW (ref 140–370)
Plt Count: 83 10*3/uL — ABNORMAL LOW (ref 140–370)
Plt Count: 59 10*3/uL — ABNORMAL LOW (ref 140–370)
Plt Est: DECREASED
Plt Est: DECREASED
RBC: 2.11 10*6/uL — ABNORMAL LOW (ref 3.90–5.20)
RBC: 2.18 10*6/uL — ABNORMAL LOW (ref 3.90–5.20)
RBC: 2.01 10*6/uL — ABNORMAL LOW (ref 3.90–5.20)
RDW: 19.7 % — ABNORMAL HIGH (ref 12.0–14.0)
RDW: 19.6 % — ABNORMAL HIGH (ref 12.0–14.0)
RDW: 19.4 % — ABNORMAL HIGH (ref 12.0–14.0)
Segs: 88 % — ABNORMAL HIGH (ref 34–71)
Segs: 87 % — ABNORMAL HIGH (ref 34–71)
Segs: 87 % — ABNORMAL HIGH (ref 34–71)
WBC: 13.8 10*3/uL — ABNORMAL HIGH (ref 4.0–10.0)
WBC: 13 10*3/uL — ABNORMAL HIGH (ref 4.0–10.0)
WBC: 12.1 10*3/uL — ABNORMAL HIGH (ref 4.0–10.0)

## 2014-10-13 LAB — ARTERIAL BLOOD GAS
BE, Art: -10.8 mmol/L — ABNORMAL LOW (ref <=2.3)
BE, Art: -12.1 mmol/L — ABNORMAL LOW (ref <=2.3)
BE, Art: -6.1 mmol/L — ABNORMAL LOW (ref <=2.3)
FIO2: 40 %
FIO2: 40 %
Flow Rate: 5 L/min
HCO3, Art: 15 mmol/L — ABNORMAL LOW (ref 20–29)
HCO3, Art: 15 mmol/L — ABNORMAL LOW (ref 20–29)
HCO3, Art: 20 mmol/L (ref 20–29)
O2 Sat, Art (Est): 95 % (ref 94–100)
Temp: 36.6 'C
Temp: 36.1 'C
Temp: 35.8 'C
pCO2, Art (T): 35 mmHg — ABNORMAL LOW (ref 36–46)
pCO2, Art (T): 36 mmHg (ref 36–46)
pCO2, Art (T): 39 mmHg (ref 36–46)
pCO2, Art (Uncorr): 35 mmHg (ref 36–46)
pCO2, Art (Uncorr): 38 mmHg (ref 36–46)
pCO2, Art (Uncorr): 42 mmHg (ref 36–46)
pH, Art (T): 7.26 — ABNORMAL LOW (ref 7.35–7.46)
pH, Art (T): 7.22 — ABNORMAL LOW (ref 7.35–7.46)
pH, Art (T): 7.32 — ABNORMAL LOW (ref 7.35–7.46)
pH, Art (Uncorr): 7.26 (ref 7.35–7.46)
pH, Art (Uncorr): 7.21 (ref 7.35–7.46)
pH, Art (Uncorr): 7.3 (ref 7.35–7.46)
pO2, Art (T): 83 mmHg (ref 74–109)
pO2, Art (T): 95 mmHg (ref 74–109)
pO2, Art (T): 87 mmHg (ref 74–109)
pO2, Art (Uncorr): 85 mmHg (ref 74–109)
pO2, Art (Uncorr): 100 mmHg (ref 74–109)
pO2, Art (Uncorr): 94 mmHg (ref 74–109)

## 2014-10-13 LAB — BUN, PREFILTER
BUN, Prefilter: 39 mg/dL
BUN, Prefilter: 29 mg/dL

## 2014-10-13 LAB — APTT, BLOOD: PTT: 54.3 s — ABNORMAL HIGH (ref 25.0–34.0)

## 2014-10-13 LAB — PROTHROMBIN TIME, BLOOD
INR: 1.4
INR: 1.8
PT,Patient: 15.1 s — ABNORMAL HIGH (ref 9.7–12.5)
PT,Patient: 20.1 s — ABNORMAL HIGH (ref 9.7–12.5)

## 2014-10-13 LAB — GLUCOSE (POCT)
Glucose (POCT): 45 mg/dL — CL (ref 70–115)
Glucose (POCT): 45 mg/dL — CL (ref 70–115)
Glucose (POCT): 111 mg/dL (ref 70–115)
Glucose (POCT): 89 mg/dL (ref 70–115)
Glucose (POCT): 76 mg/dL (ref 70–115)
Glucose (POCT): 77 mg/dL (ref 70–115)
Glucose (POCT): 91 mg/dL (ref 70–115)
Glucose (POCT): 98 mg/dL (ref 70–115)
Glucose (POCT): 120 mg/dL — ABNORMAL HIGH (ref 70–115)

## 2014-10-13 LAB — CREATININE, ULTRAFILTER
Creat, Ultrafilter: 2.54 mg/dL
Creat, Ultrafilter: 1.72 mg/dL

## 2014-10-13 LAB — MAGNESIUM, BLOOD
Magnesium: 2.7 mg/dL — ABNORMAL HIGH (ref 1.7–2.6)
Magnesium: 2.8 mg/dL — ABNORMAL HIGH (ref 1.7–2.6)
Magnesium: 2.4 mg/dL (ref 1.7–2.6)

## 2014-10-13 LAB — LIVER PANEL, BLOOD
ALT (SGPT): 38 U/L — ABNORMAL HIGH (ref 0–33)
ALT (SGPT): 34 U/L — ABNORMAL HIGH (ref 0–33)
AST (SGOT): 35 U/L — ABNORMAL HIGH (ref 0–32)
AST (SGOT): 28 U/L (ref 0–32)
Albumin: 3.8 g/dL (ref 3.5–5.2)
Albumin: 4.1 g/dL (ref 3.5–5.2)
Alkaline Phos: 111 U/L (ref 35–140)
Alkaline Phos: 103 U/L (ref 35–140)
Bilirubin, Dir: 1.2 mg/dL — ABNORMAL HIGH (ref <0.2)
Bilirubin, Dir: 1.3 mg/dL — ABNORMAL HIGH (ref <0.2)
Bilirubin, Tot: 2.27 mg/dL — ABNORMAL HIGH (ref <1.20)
Bilirubin, Tot: 2.69 mg/dL — ABNORMAL HIGH (ref <1.20)
Total Protein: 6.1 g/dL (ref 6.0–8.0)
Total Protein: 6.2 g/dL (ref 6.0–8.0)

## 2014-10-13 LAB — TYPE & SCREEN
ABO/RH: O POS
Antibody Screen: NEGATIVE

## 2014-10-13 LAB — GLUCOSE, OTHER: Glucose, Other: 184 mg/dL

## 2014-10-13 LAB — COMPREHENSIVE METABOLIC PANEL, BLOOD
ALT (SGPT): 39 U/L — ABNORMAL HIGH (ref 0–33)
AST (SGOT): 37 U/L — ABNORMAL HIGH (ref 0–32)
Albumin: 4 g/dL (ref 3.5–5.2)
Alkaline Phos: 113 U/L (ref 35–140)
Anion Gap: 15 mmol/L (ref 7–15)
BUN: 46 mg/dL — ABNORMAL HIGH (ref 6–20)
Bicarbonate: 15 mmol/L — ABNORMAL LOW (ref 22–29)
Bilirubin, Tot: 2.17 mg/dL — ABNORMAL HIGH (ref <1.20)
Calcium: 7.8 mg/dL — ABNORMAL LOW (ref 8.5–10.6)
Chloride: 99 mmol/L (ref 98–107)
Creatinine: 2.79 mg/dL — ABNORMAL HIGH (ref 0.51–0.95)
GFR: 17 mL/min
Glucose: 40 mg/dL — CL (ref 70–115)
Potassium: 4 mmol/L (ref 3.5–5.1)
Sodium: 129 mmol/L — ABNORMAL LOW (ref 136–145)
Total Protein: 6.1 g/dL (ref 6.0–8.0)

## 2014-10-13 LAB — CHOLESTEROL, OTHER: Cholesterol, Other: 13 mg/dL

## 2014-10-13 LAB — CREATININE, POSTFILTER
Creat, Postfilter: 1.68 mg/dL
Creat, Postfilter: 1.61 mg/dL

## 2014-10-13 LAB — CALCIUM, IONIZED BLOOD
Ca, Ionized: 1.03 mMol/L — ABNORMAL LOW (ref 1.13–1.32)
Ca, Ionized: 0.9 mMol/L — ABNORMAL LOW (ref 1.13–1.32)
Ca, Ionized: 0.95 mMol/L — ABNORMAL LOW (ref 1.13–1.32)
Ca, Ionized: 0.89 mMol/L — ABNORMAL LOW (ref 1.13–1.32)
Ca, Ionized: 0.95 mMol/L — ABNORMAL LOW (ref 1.13–1.32)

## 2014-10-13 LAB — URINE EOSINOPHILS: Eos Smear, Urine: NONE SEEN

## 2014-10-13 LAB — TOTAL PROTEIN, OTHER: Total Protein, Other: 2 g/dL

## 2014-10-13 LAB — CREATININE, PREFILTER
Creat, Prefilter: 2.35 mg/dL
Creat, Prefilter: 1.87 mg/dL

## 2014-10-13 LAB — IONIZED CALCIUM, POSTFILTER
Ca, Ionized, Postfilter: 0.48 mMol/L
Ca, Ionized, Postfilter: 0.31 mMol/L
Ca, Ionized, Postfilter: 0.37 mMol/L
Ca, Ionized, Postfilter: 0.4 mMol/L
Ca, Ionized, Postfilter: 0.45 mMol/L

## 2014-10-13 LAB — PHOSPHORUS, BLOOD
Phosphorous: 6.4 mg/dL — ABNORMAL HIGH (ref 2.7–4.5)
Phosphorous: 4.8 mg/dL — ABNORMAL HIGH (ref 2.7–4.5)

## 2014-10-13 LAB — LDH, BLOOD: LDH: 183 U/L (ref 135–214)

## 2014-10-13 LAB — BICARBONATE, BLOOD: Bicarbonate: 19 mmol/L — ABNORMAL LOW (ref 22–29)

## 2014-10-13 LAB — VANCOMYCIN, RANDOM: Vancomycin, Random: 20 ug/mL

## 2014-10-13 LAB — FIBRINOGEN, BLOOD: Fibrinogen: 96 mg/dL — CR (ref 200–450)

## 2014-10-13 LAB — LDH, OTHER: LDH, Other: 91 IU/L

## 2014-10-13 LAB — BUN, ULTRAFILTER
BUN, Ultrafilter: 41 mg/dL
BUN, Ultrafilter: 26 mg/dL

## 2014-10-13 LAB — PREPARE FFP/THAWED PLASMA

## 2014-10-13 LAB — BUN, POSTFILTER
BUN, Postfilter: 30 mg/dL
BUN, Postfilter: 27 mg/dL

## 2014-10-13 LAB — LACTATE, BLOOD: Lactate: 1.8 mmol/L (ref 0.5–2.2)

## 2014-10-13 LAB — URINE CULTURE: Urine Culture Result: NO GROWTH

## 2014-10-13 LAB — AMYLASE, OTHER: Amylase, Other: 7 U/L

## 2014-10-13 MED ORDER — FUROSEMIDE 10 MG/ML IJ SOLN
Freq: Once | INTRAMUSCULAR | Status: AC
Start: 2014-10-13 — End: 2014-10-13
  Administered 2014-10-13: 04:00:00 via INTRAVENOUS
  Filled 2014-10-13: qty 50

## 2014-10-13 MED ORDER — POTASSIUM CHLORIDE 20 MEQ/50ML IV SOLN
20.0000 meq | INTRAVENOUS | Status: DC | PRN
Start: 2014-10-13 — End: 2014-10-14
  Administered 2014-10-13 – 2014-10-14 (×6): 20 meq via INTRAVENOUS

## 2014-10-13 MED ORDER — LACTULOSE 10 GM/15ML OR SOLN
20.0000 g | Freq: Three times a day (TID) | ORAL | Status: DC
Start: 2014-10-13 — End: 2014-10-14
  Administered 2014-10-13 – 2014-10-14 (×4): 20 g via NASOGASTRIC
  Filled 2014-10-13 (×3): qty 30

## 2014-10-13 MED ORDER — PROTHROMBIN COMPLEX CONC HUMAN 500 UNITS IV KIT
528.00 [IU] | PACK | Freq: Once | INTRAVENOUS | Status: AC
Start: 2014-10-13 — End: 2014-10-13
  Administered 2014-10-13: 528 [IU] via INTRAVENOUS
  Filled 2014-10-13: qty 528

## 2014-10-13 MED ORDER — SODIUM PHOSPHATE 10 MEQ/50 ML D5W
10.0000 meq | Status: DC | PRN
Start: 2014-10-13 — End: 2014-10-14

## 2014-10-13 MED ORDER — PRISMASATE BGK 4/2.5 CRRT DIALYSATE BASE
INTRAVENOUS | Status: DC
Start: 2014-10-13 — End: 2014-10-14
  Administered 2014-10-13 – 2014-10-14 (×6): via INTRAVENOUS_CENTRAL
  Filled 2014-10-13 (×11): qty 5000

## 2014-10-13 MED ORDER — POTASSIUM CHLORIDE 20 MEQ/50ML IV SOLN
20.0000 meq | INTRAVENOUS | Status: DC | PRN
Start: 2014-10-13 — End: 2014-10-14
  Filled 2014-10-13 (×2): qty 150

## 2014-10-13 MED ORDER — SODIUM CITRATE 4% (0.14 M) SYRINGE
4.0000 mL | Status: DC
Start: 2014-10-13 — End: 2014-10-15
  Administered 2014-10-13: 4 mL
  Filled 2014-10-13: qty 8
  Filled 2014-10-13 (×2): qty 4

## 2014-10-13 MED ORDER — FAMOTIDINE 20 MG OR TABS
20.0000 mg | ORAL_TABLET | Freq: Every day | ORAL | Status: DC
Start: 2014-10-14 — End: 2014-10-14
  Administered 2014-10-14: 20 mg via NASOGASTRIC
  Filled 2014-10-13 (×2): qty 1

## 2014-10-13 MED ORDER — ANTICOAGULANT SODIUM CITRATE 4 % SOLUTION
Status: DC
Start: 2014-10-13 — End: 2014-10-14
  Administered 2014-10-13: 100 mL/h via INTRAVENOUS_CENTRAL
  Administered 2014-10-13 (×2): 90 mL/h via INTRAVENOUS_CENTRAL
  Administered 2014-10-13: 09:00:00 via INTRAVENOUS_CENTRAL
  Administered 2014-10-14 (×2): 110 mL/h via INTRAVENOUS_CENTRAL
  Administered 2014-10-14 (×2): via INTRAVENOUS_CENTRAL
  Filled 2014-10-13 (×6): qty 500

## 2014-10-13 MED ORDER — MIDODRINE HCL 5 MG OR TABS
10.0000 mg | ORAL_TABLET | Freq: Three times a day (TID) | ORAL | Status: DC
Start: 2014-10-13 — End: 2014-10-14
  Administered 2014-10-13 – 2014-10-14 (×4): 10 mg via NASOGASTRIC
  Filled 2014-10-13 (×4): qty 2

## 2014-10-13 MED ORDER — GUAIFENESIN-DM 100-10 MG/5ML OR SYRP
10.0000 mL | ORAL_SOLUTION | ORAL | Status: DC | PRN
Start: 2014-10-13 — End: 2014-10-14
  Administered 2014-10-14: 10 mL via NASOGASTRIC
  Filled 2014-10-13: qty 10

## 2014-10-13 MED ORDER — SODIUM CHLORIDE 0.9 % IV SOLN
INTRAVENOUS | Status: DC
Start: 2014-10-13 — End: 2014-10-14
  Administered 2014-10-13 – 2014-10-14 (×3): via INTRAVENOUS
  Filled 2014-10-13 (×3): qty 140

## 2014-10-13 MED ORDER — K PHOS DI & MONO-SOD PHOS MONO 155-852-130 MG OR TABS
250.0000 mg | ORAL_TABLET | ORAL | Status: DC | PRN
Start: 2014-10-13 — End: 2014-10-14

## 2014-10-13 MED ORDER — SODIUM CHLORIDE 0.9 % IV SOLN
INTRAVENOUS | Status: DC
Start: 2014-10-13 — End: 2014-10-14
  Administered 2014-10-13 – 2014-10-14 (×2): via INTRAVENOUS_CENTRAL

## 2014-10-13 MED ORDER — DEXTROSE 5 % IV SOLN
INTRAVENOUS | Status: DC
Start: 2014-10-13 — End: 2014-10-14
  Administered 2014-10-13 (×2): via INTRAVENOUS

## 2014-10-13 MED ORDER — K PHOS DI & MONO-SOD PHOS MONO 155-852-130 MG OR TABS
250.0000 mg | ORAL_TABLET | ORAL | Status: DC | PRN
Start: 2014-10-13 — End: 2014-10-13

## 2014-10-13 MED ORDER — CALCIUM GLUCONATE 1000 MG/60 ML D5W
1.0000 g | Status: DC | PRN
Start: 2014-10-13 — End: 2014-10-14
  Administered 2014-10-13 – 2014-10-14 (×5): 1 g via INTRAVENOUS
  Filled 2014-10-13 (×5): qty 50

## 2014-10-13 MED ORDER — MAGNESIUM SULFATE 2 GM/50ML IV SOLN
2.0000 g | INTRAVENOUS | Status: DC | PRN
Start: 2014-10-13 — End: 2014-10-14

## 2014-10-13 MED ORDER — POTASSIUM CHLORIDE 20 MEQ/50ML IV SOLN
20.0000 meq | INTRAVENOUS | Status: DC | PRN
Start: 2014-10-13 — End: 2014-10-14

## 2014-10-13 MED ORDER — SODIUM CHLORIDE 0.9 % IV SOLN
3375.0000 mg | Freq: Three times a day (TID) | INTRAVENOUS | Status: DC
Start: 2014-10-13 — End: 2014-10-14
  Administered 2014-10-13 – 2014-10-14 (×4): 3375 mg via INTRAVENOUS
  Filled 2014-10-13 (×4): qty 3375

## 2014-10-13 MED ORDER — K PHOS DI & MONO-SOD PHOS MONO 155-852-130 MG OR TABS
500.0000 mg | ORAL_TABLET | ORAL | Status: DC | PRN
Start: 2014-10-13 — End: 2014-10-14

## 2014-10-13 MED ORDER — K PHOS DI & MONO-SOD PHOS MONO 155-852-130 MG OR TABS
500.0000 mg | ORAL_TABLET | ORAL | Status: DC | PRN
Start: 2014-10-13 — End: 2014-10-13

## 2014-10-13 MED ORDER — SODIUM CHLORIDE 0.45 % IV SOLN
INTRAVENOUS | Status: DC
Start: 2014-10-13 — End: 2014-10-14
  Administered 2014-10-13 – 2014-10-14 (×9): via INTRAVENOUS
  Filled 2014-10-13 (×13): qty 75

## 2014-10-13 MED ORDER — VANCOMYCIN HCL 1000 MG IV SOLR
600.0000 mg | Freq: Two times a day (BID) | INTRAVENOUS | Status: DC
Start: 2014-10-13 — End: 2014-10-14
  Administered 2014-10-13 – 2014-10-14 (×3): 600 mg via INTRAVENOUS
  Filled 2014-10-13 (×3): qty 600

## 2014-10-13 MED ORDER — INSULIN GLARGINE 100 UNIT/ML SC SOLN
16.0000 [IU] | Freq: Every day | SUBCUTANEOUS | Status: DC
Start: 2014-10-13 — End: 2014-10-14
  Administered 2014-10-13: 16 [IU] via SUBCUTANEOUS
  Filled 2014-10-13: qty 16

## 2014-10-13 MED ORDER — FENTANYL CITRATE 0.05 MG/ML IJ SOLN
12.5000 ug | INTRAMUSCULAR | Status: DC | PRN
Start: 2014-10-13 — End: 2014-10-14
  Administered 2014-10-13 – 2014-10-14 (×2): 12.5 ug via INTRAVENOUS
  Filled 2014-10-13 (×2): qty 2

## 2014-10-13 MED ORDER — SODIUM CHLORIDE 0.9 % IV SOLN
INTRAVENOUS | Status: DC
Start: 2014-10-13 — End: 2014-10-14

## 2014-10-13 MED ORDER — STERILE WATER FOR INJECTION IV SOLN (CUSTOM)
INTRAVENOUS | Status: DC
Start: 2014-10-13 — End: 2014-10-14
  Administered 2014-10-13 (×4): via INTRAVENOUS
  Filled 2014-10-13 (×4): qty 150

## 2014-10-13 MED ORDER — PROTHROMBIN COMPLEX CONC HUMAN 500 UNITS IV KIT
500.0000 [IU] | PACK | Freq: Once | INTRAVENOUS | Status: DC
Start: 2014-10-13 — End: 2014-10-13

## 2014-10-13 MED ORDER — RIFAXIMIN 550 MG PO TABS
550.0000 mg | ORAL_TABLET | Freq: Two times a day (BID) | ORAL | Status: DC
Start: 2014-10-13 — End: 2014-10-14
  Administered 2014-10-13 – 2014-10-14 (×2): 550 mg via NASOGASTRIC
  Filled 2014-10-13 (×2): qty 1

## 2014-10-13 MED ORDER — INSULIN REGULAR HUMAN 100 UNIT/ML IJ SOLN
1.0000 [IU] | Freq: Four times a day (QID) | INTRAMUSCULAR | Status: DC
Start: 2014-10-13 — End: 2014-10-14
  Administered 2014-10-14: 1 [IU] via SUBCUTANEOUS
  Filled 2014-10-13: qty 1

## 2014-10-13 MED ORDER — FUROSEMIDE 10 MG/ML IJ SOLN
120.0000 mg | Freq: Once | INTRAMUSCULAR | Status: DC
Start: 2014-10-13 — End: 2014-10-13

## 2014-10-13 MED ORDER — DILTIAZEM HCL 60 MG OR TABS
30.0000 mg | ORAL_TABLET | Freq: Four times a day (QID) | ORAL | Status: DC
Start: 2014-10-13 — End: 2014-10-14
  Administered 2014-10-13 – 2014-10-14 (×5): 30 mg via NASOGASTRIC
  Filled 2014-10-13 (×5): qty 1

## 2014-10-13 MED ORDER — FENTANYL CITRATE 0.05 MG/ML IJ SOLN
25.0000 ug | INTRAMUSCULAR | Status: DC | PRN
Start: 2014-10-13 — End: 2014-10-14
  Administered 2014-10-14 (×2): 25 ug via INTRAVENOUS
  Filled 2014-10-13 (×2): qty 2

## 2014-10-13 MED ORDER — SODIUM CHLORIDE 0.9 % IV SOLN
INTRAVENOUS | Status: DC
Start: 2014-10-13 — End: 2014-10-14
  Administered 2014-10-13 – 2014-10-14 (×5): via INTRAVENOUS_CENTRAL

## 2014-10-13 NOTE — Interdisciplinary (Signed)
Pt transferred to ICU level of care on 3/3.  Pt is currently ON HOLD as pt not appropriate for therapy.  Please reconsult/reorder OT when pt is appropriate for therapy services.    Thank you, Azucena Kuba (COTA).

## 2014-10-13 NOTE — Progress Notes (Addendum)
Medical ICU   Progress Note    Patient: [name] Hospital Day:   20 days - Admitted on: 10/04/2014     Location: CCU03/CCU03    ID: Paula Manning is a 74 year old female with a history of HCV-2 cirrhosis c/b ascites, EV, and HE s/p TIPS (06/28/14 with revision 09/14/14 for pleural effusion), DM, and HTN who initially presented on 2/13 with AMS in the setting of recent UTI and was found to have diarrhea 2/2 Norovirus. Has diuretic-refractory hepatic hydrothorax for which she has been given serial thoracenteses (last done 2/29) who was transferred to the ICU last night due to ongoing dyspnea, progressive hypoxia, and acute renal failure with anion-gap metabolic acidosis.     INTERVAL EVENTS/SUBJECTIVE:   - Pleural fluid studies consistent with transudate  - Placed A-line and vas-cath   - Started CRRT this morning  - Family updated on patient's condition and treatment plan  - Patient on BiPAP appearing disoriented and unable to follow commands    OBJECTIVE:  BiPAP Settings:  FiO2 70%  12/5    Vital Signs:   Latest Entry  Range (last 24 hours)    Temperature: 97.4 F (36.3 C)  Temp  Avg: 97.3 F (36.3 C)  Min: 97.2 F (36.2 C)  Max: 97.4 F (36.3 C)    Blood pressure (BP): 114/48 mmHg  BP  Min: 87/59  Max: 136/39    Heart Rate: 88  Pulse  Avg: 83.2  Min: 78  Max: 94    Respirations: 15  Resp  Avg: 17.4  Min: 10  Max: 24    SpO2: 98 %  SpO2  Avg: 96.7 %  Min: 84 %  Max: 100 %       No Data Recorded     Weight - scale: 49.1 kg (108 lb 3.9 oz)  Percentage Weight Change (%): -1.8 %  Blood pressure (BP): 114/48 mmHg  Heart Rate: 88  SpO2: 98 %    Intake/Output       10/12/14 0600 - 10/13/14 0559 10/13/14 0600 - 10/14/14 0559      0947-0962 8366-2947 Total 0600-1759 6546-5035 Total       Intake    P.O.  287  40 327  --  -- --    I.V.  1056  153 1209  --  -- --    Total Intake 1343 193 1536 -- -- --       Output    Urine  40  10 50  --  -- --    Other  --  1000 1000  --  -- --    Total Output 40 1010  1050 -- -- --       Net I/O     1303 -817 486 -- -- --          Physical Exam:  General: elderly appearing woman on BiPAP, unable to follow commands   HEENT: EOMI, PERRL, moist mucous membranes  CV: RRR  Pulm: crackles bilaterally, most prominent in LUL  Abdomen: tense ascites, non-TTP, bowel sounds present  Ext: 4 mm pitting edema bilaterally from ankles to sacrum  Skin: multiple ecchymoses on bilateral forearms with mepilex covering wound on L forearm  GU: foley catheter intact    Current Antibiotics:   IV vancomycin (2/23-2/28, restarted 3/3-)  IV zosyn (2/23-2/28, restarted 3/3-)    Continuous Medications:   calcium gluconate infusion (CRRT)      dextrose 40  mL/hr at 10/13/14 0659    PRISMASATE with Calcium CRRT dialysate      CRRT Replacement - sodium bicarbonate 150 mEq/L in sterile water replacement fluid      CRRT Replacement - sodium bicarbonate 75 mEq/L in NaCl 0.45%      CRRT Machine Post-Filter Fluid - sodium chloride 0.9% infusion      CRRT Machine Pre-Filter Fluid - sodium chloride 0.9% infusion      CRRT Replacement - sodium chloride 0.9% infusion      sodium chloride      sodium citrate       Scheduled Medications:   albumin human  25 g BID    cholecalciferol  400 Units Daily    diltiazem  180 mg Daily    famotidine  20 mg Daily    insulin glargine  16 Units Daily    insulin regular  1-10 Units Q6H    lactulose  20 g TID    metoclopramide  5 mg Q12H    midodrine  10 mg TID    multivitamin  5 mL Daily    octreotide  200 mcg TID    piperacillin-tazobactam (ZOSYN) IVPB  3,375 mg Q12H    rifaximin  550 mg Q12H    sodium chloride (PF)  3 mL Q8H    sodium citrate  4 mL As Directed    tiotropium  1 capsule Daily    VANCOMYCIN PER PHARMACY   As Directed     PRN Medications:   albuterol  2.5 mg PRN    benzonatate  100 mg TID PRN    calcium gluconate  1 g PRN    dextrose  12.5 g PRN    glucagon  1 mg Once PRN    glucose  4 tablet PRN    glucose  1 Tube PRN     guaiFENesin-dextromethorphan  10 mL Q4H PRN    lidocaine  5 mL Once PRN    LORazepam  0.5 mg Daily PRN    magnesium sulfate  2 g PRN    magnesium sulfate  2 g PRN    magnesium sulfate  2 g PRN    nalOXone  0.1 mg Q2 Min PRN    phosphorus  250 mg PRN    phosphorus  500 mg PRN    potassium chloride  20 mEq PRN    potassium chloride  20 mEq PRN    potassium chloride  20 mEq PRN    prochlorperazine  10 mg Q6H PRN    simethicone  80 mg Q6H PRN    sodium chloride (PF)  10 mL PRN    sodium chloride (PF)  3 mL PRN    0.9% sodium chloride flush   Once PRN    sodium chloride   Continuous PRN    sodium PHOSphate  10 mEq PRN    traMADol  50 mg Q6H PRN         Labs:   Recent Labs      10/12/14   1330  10/13/14   0153   ARTPH  7.26*  7.26*   ARTPCO2  35*  35*   ARTPO2  62*  83     Recent Labs      10/11/14   0452  10/12/14   0446  10/13/14   0500   WBC  14.6*  13.2*  13.8*   HGB  8.7*  8.1*  7.2*   HCT  25.5*  23.8*  21.1*  PLT  139*  94*  86*     Recent Labs      10/11/14   0452  10/12/14   0659  10/13/14   0500   NA  131*  129*  129*   K  3.9  3.9  4.0   CL  97*  96*  99   BICARB  12*  13*  15*   BUN  36*  44*  46*   CREAT  1.44*  2.31*  2.79*   GLU  256*  230*  40*   [place]  8.8  8.9  7.8*   PHOS  5.2*  5.6*   --    MG  2.7*  3.0*  2.7*   TP  6.5   --   6.1   ALB  3.8   --   4.0   AST  20   --   37*   ALT  16   --   39*   ALK  118   --   113   TBILI  3.19*   --   2.17*       Microbiology:  Pleural fluid culture (09/19/2014): NGTD  Urine culture (10/11/14): NGTD  Pleural fluid culture (10/12/14): pending    ASSESSMENT/PLAN:  Paula Manning is a 74 year old female with a history of HCV-2 cirrhosis c/b ascites, EV, and HE s/p TIPS (06/28/14 with revision 09/14/14 for pleural effusion), DM, and HTN who initially presented on 2/13 with AMS in the setting of recent UTI and was found to have diarrhea 2/2 Norovirus. Has diuretic-refractory hepatic hydrothorax for which she has been given serial thoracenteses (last done  2/29) who was transferred to the ICU last night due to ongoing dyspnea, progressive hypoxia, and acute renal failure with anion-gap metabolic acidosis.     # Acute renal failure with metabolic acidosis: Cr continues to trend up with poor UOP overnight, now starting CRRT. Etiologies for ARF include ATN, AIN (possibly medications), HRS, and post-obstructive. Less likely post-renal given absence of hydronephrosis on recent US (3/2). 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    - Appreciate nephrology recommendations  - CRRT today  - Consider obtaining urine electrolytes to evaluate for alternative etiologies given HRS is a diagnosis of exclusion     # Acute hypoxic respiratory failure: 2/2 recurrent hepatic hydrothorax and concerning for PNA based on recent CT scan. Now s/p thoracentesis (3/3) and tolerated BiPAP overnight.   - Place NG tube to administer medications  - Wean O2 as tolerated; check ABG  - Continue IV vanc/zosyn (2/23-2/28, restarted 3/3-)   - Continue albumin 25 g BID     # AMS: more disoriented and confused this morning compared to yesterday. Concerning for ongoing acidosis vs delirium vs infection vs encephalopathy.   - Check ABG  - Continue lactulose/rifaximin    # Abdominal tinkling: noted on PEX, had Norovirus diarrhea and abdominal pain with colonic ileus on imaging.  - Reglan   - f/u blood cultures     # HCV GT2 cirrhosis: c/b ascites, HE, and diuretic resistant HHT, s/p TIPS 06/28/2014 and TIPS revision 09/14/14. MELD score 27 points (from 20 points about 2 days ago).   - Appreciate ongoing hepatology recommendations  - HE: lactulose, titrate to 3-4 BM/day + rifaximin  - Ascites: IV lasix 20 mg qdaily + spironolactone 100 mg qdaily  - EV: s/p TIPS. Octreotide 200 mcg TID  - HHT: s/p TIPS and revision  - Portal vein thrombosis,  non-occlusive: anticoagulation relatively contraindicated given medication non-adherence and fall risk  - [place] surveillance: CT 06/27/14 negative for HCC. Due for imaging in May  2016  - Transplant: not candidate    # Recent UTI: no growth on repeat cultures.  - IV zosyn as above  - f/u urine culture    # Afib: rate controlled.   - Continue diltiazem via NG tube  - Anticoagulation relative contraindication as above     # DM2: c/b hypoglycemia overnight with BG 40-80s.  - Continue lantus 16 units + moderate intensity sliding scale   - Increase D5W @ 100 ml/h       F: NPO, D5W @ 100 ml/h  A: None  S: None  T: ICDs H: >30 degrees  U: Famotidine  G: Basal with sliding scale, on D5W  S: Lactulose, titrated to 3 BM per day     Code: Full code, full resuscitation    Patient Lines/Drains/Airways Status    Active PICC Line / CVC Line / PIV Line / Drain / Airway / Intraosseous Line / Epidural Line / ART Line / Line Type / Wound     Name: Placement date: Placement time: Site: Days:    PICC Double Lumen - Power PICC Right Brachial 10/12/14  1350  Brachial  less than 1    HD/Pheresis Catheter - Vas Cath - Triple Lumen Right Femoral vein 10/13/14  0600  Femoral vein  less than 1    Indwelling Urinary Catheter -  10/01/2014 1150 RN Standard (Latex) 10 ml Yes 09/25/2014  1150  Standard (Latex)  3    Impaired Skin Integrity -  Skin tear Arm Left 10/05/14  0800  Arm  7                Patient was seen and discussed with the attending physician, Dr. Cristal Deer, Demosthen* who agrees with my assessment and plan.    Hessie Knows, PGY-1  Internal Medicine  Pager [phone]            The patient was seen and examined on ICU rounds.  Laboratory tests, and imaging studies were reviewed and discussed with the medical team. 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 The patient was supported with noninvasive positive pressure ventilation  Broad-spectrum antibiotics started, questionable aspiration pneumonia with altered mental status, the patient is continued on lactulose and rifaximin, in addition to diuretics, follow urine and blood cultures, rate control for atrial fibrillation, decision was made not to anticoagulate the patient because of high bleeding risk.  Prognosis is poor, the family is aware.  I am issuing letters from family members to facilitate the border crossing, the patient is not expected to survive this episode.        Karl Pock, M.D., FACP, [facility],  Pulmonary and critical care medicine.

## 2014-10-13 NOTE — Interdisciplinary (Signed)
Pt with change in status and now in CCU. ST to place on hold, please re-consult if/when pt able to participate.

## 2014-10-13 NOTE — Plan of Care (Signed)
Problem: Discharge Planning  Goal: Participation in care planning  Outcome: Met  Discussed POC with family overnight, CRRT to be initiated.  Goal: Verbalizes/demonstrates knowledge of discharge instructions  Outcome: Not Met  Pt not ready for discharge.  Goal: Patient is at risk for readmission- risk factors have been addressed  Outcome: Not Met  Pt not ready for discharge.    Problem: Falls, Risk of  Goal: Keep patient free from falls utilizing universal fall precautions  Outcome: Met  Goal: Ensure safe mobility of patient (Mobility)  Outcome: Not Met  Pt not OOB overnight.  Goal: Minimize the risk of falls due to the effects of prescribed medications (Medications)  Outcome: Met    Problem: Tissue Perfusion, Cardiopulmonary - Altered  Goal: Hemodynamic stability  Outcome: Met  Goal: Early recognition of deterioration  Outcome: Met    Problem: Pain - Acute  Goal: Communication of presence of pain  Outcome: Met  Goal: Control of acute pain  Outcome: Met    Problem: Alteration in Blood Glucose  Goal: Glucose level within specified parameters  Outcome: Met    Problem: Infection  Goal: Absence of infection signs and symptoms  Outcome: Met    Problem: Skin Integrity- Intact patient high risk for impaired skin integrity Braden scale less than or equal to 18  Goal: Skin remains intact  Outcome: Met    Problem: Aspiration, Risk of  Goal: Evidence of swallowing difficulties is absent  Outcome: Met  Pt is NPO.    Problem: Breathing Pattern - Ineffective  Goal: Respiratory rate, rhythm and depth return to baseline  Outcome: Not Met  Pt requires BiPAP overnight.    Problem: Nutrition Deficit  Goal: Adequate nutritional intake  Outcome: Not Met  Pt is NPO.

## 2014-10-13 NOTE — Interdisciplinary (Signed)
Paged 1st to call, Dr. Evelina Bucy re: pt's bp goals. Pt's current SBP = 100, MAP = 56-58.  Dr. Evelina Bucy and Dr. Reuel Derby came to bedside and recommended to continue monitoring and contact them again if MAP is sustained <55.

## 2014-10-13 NOTE — Progress Notes (Signed)
GI/Hepatology Daily Progress Note:    Consult Fellow: Harle Stanford, MD  Consult Attending: Dr. Andreas Ohm, [place] [facility] Stay:   20 days - Admitted on: 10/05/2014    Events/Subjective:  - Transferred to ICU overnight for increased SOB/oxygen requirement requiring up to 15L NRB and BIPAP, currently on 10L this morning  - s/p thora 1L with minimal improvement  - minimal UOP and seen by renal with plans to start CRRT this morning after discussion with husband    Objective:  Vital Signs:  Temperature:  [96.1 F (35.6 C)-97.4 F (36.3 C)] 96.4 F (35.8 C) (03/04 1400)  Blood pressure (BP): (87-136)/(32-78) 109/36 mmHg (03/04 0700)  Heart Rate:  [72-91] 74 (03/04 1500)  Respirations:  [9-23] 14 (03/04 1500)  Pain Score:  [-] 0 (03/04 0600)  O2 Device:  [-] Nasal cannula (03/04 1200)  O2 Flow Rate (L/min):  [5 l/min-15 l/min] 5 l/min (03/04 1200)  SpO2:  [92 %-100 %] 100 % (03/04 1500)    Wt Readings from Last 1 Encounters:   10/13/14 61.8 kg (136 lb 3.9 oz)       Intake/Output (Current Shift):  03/04 0600 - 03/04 1759  In: 5338.5 [I.V.:5218.5]  Out: 5111 [Urine:55]    Physical Exam:  General:  On Bipap.   HEENT: No scleral icterus, Trachea midline, mucus membranes moist.  Lungs: Crackles bilaterally, appearing dyspneic  Cardiovascular: Irregularly irregular  Abdomen: Abdomen less tense, non tender to palpation, bowel sound present. No peritoneal signs.   Extremities: Trace peripheral edema. Warm well-perfused.  Skin: Non-icteric and no visible rash  Neuro/Psych: Somonlent. No asterixis    Laboratory data:  Labs reviewed:  Lab Results   Component Value Date    NA 129* 10/13/2014    K 4.0 10/13/2014    CL 99 10/13/2014    BICARB 13* 10/13/2014    BUN 49* 10/13/2014    CREAT 2.99* 10/13/2014    GLU 76 10/13/2014     7.9* 10/13/2014     Lab Results   Component Value Date    WBC 13.0* 10/13/2014    HGB 7.3* 10/13/2014    HCT 21.8* 10/13/2014    PLT 83* 10/13/2014    SEG 87* 10/13/2014    BAND 7 10/13/2014    LYMPHS 3*  10/13/2014    MONOS 10 10/13/2014     Lab Results   Component Value Date    AST 35* 10/13/2014    ALT 38* 10/13/2014    ALK 111 10/13/2014    TBILI 2.27* 10/13/2014    DBILI 1.2* 10/13/2014    TP 6.1 10/13/2014    ALB 3.8 10/13/2014     Lab Results   Component Value Date    INR 1.8 10/13/2014    PTT 54.3* 10/13/2014     Prior Endoscopy:   EGD 05/2013:  Findings: 1. Midline orophyarngeal prominence at the  arytenoid  2. Three columnns of grade 1 esophageal varices, no high  risk features  3. No gastric varices, mild portal hypertensive gastropathy  4. Few punctate clots in the antrum  5. Mild duodenal congestion consistent with portal  hypertension    Endoscopic Diagnosis: 1. Midline orophyarngeal  prominence at the arytenoid  2. Three columnns of grade 1 esophageal varices, no high  risk features  3. No gastric varices, mild portal hypertensive gastropathy  4. Few punctate clots in the antrum  5. Mild duodenal congestion consistent with portal  hypertension    Recommendations: 1. Continue  propranolol 10 BID  2. ENT consultation to evaluate oropharyngeal findings  3. Repeat EGD in 1 year for varices screening    Radiology:   CT chest/abd/pelvis 3/2  IMPRESSION:  1. Multifocal consolidations within the bilateral upper lobes, consistent with  multifocal pneumonia.    2. Large right and moderate left pleural effusion.    3. Please see the report for the concurrent CT abdomen/pelvis for findings   below the diaphragm.    IMPRESSION:  CT scan of the abdomen and pelvis without IV contrast    1. Significant liquid and gaseous distension of the large bowel, with   high-density material which may represent previously ingested contrast   previous exam, which would be unusual for patient on lactulose. In addition,   if this is residual oral contrast from the prior study, this indicates very   slow transit as the oral contrast is present within the colon 8 days ago.    2. Multiple cysts seen along the pancreas. Continued  attention on follow up.    3. Worsening bilateral pleural effusions, right greater than left. Patchy left  basal consolidation is seen. Please see concurrently acquired CT chest for   findings above the diaphragm.    RUQ u/s 10/10/14  The liver measures 12.1 cm in long axis. It is course in echogenicity with nodular contour. There is no intra or extra hepatic bile duct dilation. The common bile duct was not clearly visualized. The gallbladder was surgically   removed. A 3.0 x 2.4 x 2.4 cm anechoic avascular area seen within the gallbladder bed, most likely representing loculated fluid in the presence of ascites.    The right kidney measures 7.8 cm in long axis. The right kidney shows no evidence of hydronephrosis or renal calculi.    The visualized portion of the pancreas is within normal limits.    Moderate ascites and large right pleural effusion are seen.    The visualized aorta and inferior vena cava are within normal limits.    TIPS noted with velocities as follows:  1. Hepatic end: 111 cm/s  2. Mid: 88 cm/s  3. Portal venous end: 70 cm/s    The left and right portal venous flow are bidirectional. Main portal venous flow is normal (hepatopetal).    CXR 3/4  FINDINGS/IMPRESSION:  1. Normal cardiac silhouette size. Minimal interval decrease in hilar vascular  distention and perihilar interstitial pulmonary edema, especially on the   right.  2. Increase in focal consolidation in the right perihilar region with   persistent left perihilar consolidation.  3. Right basal atelectasis and bilateral pleural effusions, layering, right   greater than left persist.    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     Confusion associated with poor medication compliance, including lactulose, N/V, abd pain, diarrhea, and f/c/s. HDS. Work-up here showed GI pathogen panel with norovirus and recent UA consistent with UTI.  Course complicated by recurring HHT and ongoing air hunger/dyspnea now with decreased UOP/AKI leading to ICU transfer on 3/3  Ongoing family discussions with poor prognosis.     #Hypoxic Respiratory Failure: Likely secondary to underlying COPD with ?pulmonary edema and  reaccumulating pleural effusion from HHT.  CT chest with possible multifocal opacities concerning for PNA.  CXR demonstrating worsening fluid overload and now with minimal UOP    - planning to initiate CRRT for fluid removal   - family discussion planned today regarding poor prognosis  - d/c IV albumin, serum albumin > 3  - cont on broad-spectrum abx for HCAP   - PRN thoracentesis, avoid chest tube placement  - wean BiPAP and O2 as tolerated  - minimal emphysematous changes seen on CT, cont advair/spiriva and encourage PRN albuterol use    #Acute kidney injury: Rising Cr with minimal UOP. Concern for developing HRS.   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 HCAP vs. norovirus infection and now at baseline.   - cont lactulose, titrate for 3-4 soft bowel movements a day and rifaximin  * Ascites: diuretics held given AKI   * EV's: S/p TIPS, no longer needs surveillance with patent TIPS and global patient stability   * HHT: RUQ U/S 3/1 with patent TIPS.     -planning for [place] shunt and if no improvement will consider pleurodesis  -thoracentesis PRN with VIR   * Non-occlusive portal vein thrombosis: Multiple outpatient notes indicate poor medication adherence/knowledge, remains fall risk as well. Risks outweigh benefits  * [place] surveillance: Last CT on 06/27/2014 showed no HCC.  Negative RUQ u/s 10/2014.  Repeat imaging in May 2016.  * Transplant: Age >70, not a candidate    # Sepsis/abdominal pain:  Previously tx with broad-spectrum abx for HCAP. Possible UTI and multifocal PNA on imaging.  Retained oral contrast on CT suggestive poor motility.     - Vanco/zosyn discontinued (02/23-2/28), broaden from CTX if no improvement in resp status  - repeat thoracentesis, send for full pleural studies to rule out exudative effusion or infection  - cont supportive care for norovirus    # Afib w RVR: Rate controlled on diltiazem.  CHADS2=2, CHADS2-VASC=4. High risk of fall due to HE, increased risk of coagulopathy given cirrhosis.   - hold anticoagulation given risks outweigh benefits of therapy  - cont rate control    Patient discussed with Dr. Silverio Decamp, MD  [facility] Fellow

## 2014-10-13 NOTE — Interdisciplinary (Signed)
10 CCU BED 3  Allure, Greaser  CHest and Abd xray just done. if ngt is ok to use, pls write an order , ok to use.

## 2014-10-13 NOTE — Interdisciplinary (Signed)
DCP UPDATE:  LOS# 20 days.  Patient currently in CCU#3, somnolent, O2 supplement via nasal cannnula, CRRT in progress.  Will have family meeting with TEAM today at 2:30pm to discuss goals of care.   CM will continue to follow discharge

## 2014-10-13 NOTE — Interdisciplinary (Signed)
Pt transferred to ICU level of care on 3/3, will need new order to resume PT services.

## 2014-10-13 NOTE — Plan of Care (Signed)
Problem: Falls, Risk of  Goal: Keep patient free from falls utilizing universal fall precautions  Outcome: Met  Goal: Ensure safe mobility of patient (Mobility)  Outcome: Met  Goal: Minimize the risk of falls due to the effects of prescribed medications (Medications)  Outcome: Met    Problem: Tissue Perfusion, Cardiopulmonary - Altered  Goal: Hemodynamic stability  Outcome: Not Met  Pt's MAP trended down to low 50s. MICU team aware. Family refused central line insertion and pressors. Continue to monitor hemodynamic parameters via A-line.   Goal: Early recognition of deterioration  Outcome: Met    Problem: Pain - Acute  Goal: Communication of presence of pain  Outcome: Met  Pt has altered level of consciousness. RASS -2. Continue to assess pt using CPOT.  Goal: Control of acute pain  Outcome: Met    Problem: Alteration in Blood Glucose  Goal: Glucose level within specified parameters  Outcome: Met    Problem: Infection  Goal: Absence of infection signs and symptoms  Outcome: Met    Problem: Skin Integrity- Intact patient high risk for impaired skin integrity Braden scale less than or equal to 18  Goal: Skin remains intact  Outcome: Met    Problem: Aspiration, Risk of  Goal: Evidence of swallowing difficulties is absent  Outcome: Not Met  Pt has altered level of consciousness. MICU team aware. NG tube inserted. All PO meds switched to IV or NG.     Problem: Breathing Pattern - Ineffective  Goal: Respiratory rate, rhythm and depth return to baseline  Outcome: Met  Pt's O2 within normal limits on 4L NC.    Problem: Nutrition Deficit  Goal: Adequate nutritional intake  Outcome: Not Met  Pt is currently NPO.

## 2014-10-13 NOTE — Progress Notes (Signed)
Endocrine/Diabetes Progress Note    Overnight:  Hypoglycemic this am in setting of clinical decompensation, transferred to ICU overnight for resp distress and now on CRRT, but not intubated, on d5@100cc /hr    Subjective: lethargic, family at bedside    Objective:   Temperature:  [96.1 F (35.6 C)-97.4 F (36.3 C)] 96.4 F (35.8 C) (03/04 1400)  Blood pressure (BP): (87-136)/(32-78) 109/36 mmHg (03/04 0700)  Heart Rate:  [72-91] 74 (03/04 1500)  Respirations:  [9-23] 14 (03/04 1500)  Pain Score:  [-] 0 (03/04 0600)  O2 Device:  [-] Nasal cannula (03/04 1200)  O2 Flow Rate (L/min):  [5 l/min-15 l/min] 5 l/min (03/04 1200)  SpO2:  [92 %-100 %] 100 % (03/04 1500)  Body mass index is 25.76 kg/(m^2).    GEN: chronically ill appearing  PULM: breathing comfortably    Diet: NPO    Blood Sugars reviewed:        AM  Lu  Di  HS  O/N    2/28  114 162 180 159  Lantus   35  Lispro  8+6 8+3 6+3    2/29  88 113 231 213  Lantus   28  Lispro  12+8  4+6 +1    3/1  227 265 329 251  Lantus   28  Lispro  8+6 6+8+8 6+8+10  6    3/2  300 303 334 288  Lantus   32   Lispro  8+6 10+8 +10 +6    3/3  232 271 179 173  Lantus   32  Lispro  10+6 +8 +4    3/4  40 98  Lantus   16  Lispro     A/P:  Paula Manning is a 74 year old female with uncontrolled DM2 c/b neuropathy as well as HepC cirrhosis, HTN, glaucoma and migraines admitted 09/12/2014 w/ n/v and AMS.     Hypoglycemia in setting of clinical decompensation today, Lantus dose appropriately decreased, pt remains on D5.  Goals of care in discussion, currently DNAR.      Inpatient Recs:   - agree w lantus  16 units qday  - dec mod-->low regular correction scale q ac/hs  - would decrease rate D5 before escalating insulin  - low threshold for insulin drip in critical care setting, but if goals moving towards comfort can cont to titrate down on scheduled insulin    Outpatient Recs:  Likely no meds/monitoring given poor prognosis    will cont to follow

## 2014-10-13 NOTE — Interdisciplinary (Addendum)
SOCIAL WORK: Family meeting today 2:30pm, room reserved 9th floor conference room [id number], SW notified family members who are current in 10th floor waiting area and notified 1st call. SW to attend if available. SW requested Spanish interpreter. Also reminded 1st call of FMLA paperwork on chart for physician signature.    UPDATE Spanish interpreter confirmed for 2:30pm. Thank you.    Leanna Battles MSW  Clinical Social Worker II  Pager [phone]

## 2014-10-13 NOTE — Procedures (Addendum)
Paula Manning is a 74 year old female patient.    ICD-10-CM ICD-9-CM   1. Hepatic encephalopathy K72.90 572.2   2. Other cirrhosis of liver (HCC) K74.69 571.5   3. Diabetic polyneuropathy associated with type 2 diabetes mellitus (HCC) E11.42 250.60     357.2   4. Nausea and vomiting, unspecified intactability, vomiting of unspecified type R11.2 787.01   5. S/P TIPS (transjugular intrahepatic portosystemic shunt) Z95.828 V45.89   6. Portal vein thrombosis I81 452   7. Difficulty walking R26.2 719.7   8. Oropharyngeal dysphagia R13.12 787.22   9. Type 2 diabetes, uncontrolled, with neuropathy (HCC) E11.40 250.62    E11.65 357.2   10. Essential hypertension I10 401.9   11. Acute respiratory failure with hypoxia (HCC) J96.01 518.81   12. 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 Cirrhosis of liver with ascites, unspecified hepatic cirrhosis type (HCC) K74.60 571.5   14. Ileus of unspecified type (HCC) K56.7 560.1   15. AKI (acute kidney injury) N17.9 584.9     Past Medical History   Diagnosis Date    Migraine headache     Glaucoma     Ascites 06/29/2007     History of paracentesis x 2    Pancreatic cyst 06/29/2007    Cirrhosis ([place]) 06/24/2007     Active on the liver transplant waiting list with a blood type of O positive. 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 Due to Hep C    Osteopenia 06/24/2007    Emphysema 06/24/2007    EV (esophageal varices) ([place]) 06/10/2007    Anemia 06/10/2007    HCV (hepatitis C virus) 06/10/2007     Achieved SVR.    Type II or unspecified type diabetes mellitus with peripheral circulatory disorders, not stated as uncontrolled(250.70) 06/10/2007    HTN 06/10/2007    HTN 06/10/2007     Blood pressure 114/48, pulse 88, temperature 97.4 F (36.3 C), resp. rate 15, height 5\' 1"  (1.549 m), weight 49.1 kg (108 lb 3.9 oz), SpO2 98 %.    Arterial line placement  Date/Time: 10/13/2014 7:15 AM  Performed by: Anastasio Auerbach  Authorized by: Richardson Chiquito  Consent: The procedure was performed in an emergent situation. Verbal consent obtained. Written consent obtained.  Risks and benefits: risks, benefits and alternatives were discussed  Consent given by: guardian  Patient understanding: patient states understanding of the procedure being performed  Patient consent: the patient's understanding of the procedure matches consent given  Procedure consent: procedure consent matches procedure scheduled  Relevant documents: relevant documents present and verified  Test results: test results available and properly labeled  Site marked: the operative site was marked  Patient identity confirmed: verbally with patient and arm band  Time out: Immediately prior to procedure a "time out" was called to verify the correct patient, procedure, equipment, support staff and site/side marked as required.  Preparation: Patient was prepped and draped in the usual sterile fashion.  Local anesthesia used: yes  Local anesthetic: lidocaine 1% without epinephrine  Anesthetic total: 3 ml  Patient sedated: no  Patient tolerance: Patient tolerated the procedure well with no immediate complications  Comments: Procedure was supervised by the pulmonary critical care fellow, Dr. Kendall Flack        Anastasio Auerbach  10/13/2014    Procedure was done under my supervision.  Karl Pock, M.D., FACP, [facility],  Pulmonary and critical care medicine.

## 2014-10-13 NOTE — Interdisciplinary (Signed)
SOCIAL WORK: Brief note: Family meeting. In attendance were spouse, children, SW, [place] interpreter, liver specialist who has know the patient for years. Physician discussed liver failure, kidney failure, difficulty breathing and dropping blood pressure. Physician stated patient may have as little as hours to live, depending on her blood pressure. Family expressed clear wish not to intervene with pressure support, intubation, or chest compressions. Family wants border crossing letter for son Cory Roughen. I contacted patient financial services who have been in touch with patient's son [name] to gather the necessary information. [name] still asking about FMLA paperwork which remains not completed by physician. I spoke to resident about completing it. Family appropriately tearful, focused on keeping patient comfortable,and in agreement that patient would not want extreme interventions.    Leanna Battles MSW  Clinical Social Worker II  Pager [phone]

## 2014-10-13 NOTE — Progress Notes (Signed)
HEPATOLOGY ATTENDING NOTE    Held a meeting with the family this afternoon. [name] provided [name] interpreter support. ICU social worker, nursing student also present along with patient's husband, 2 sons, and 3 daughters. I gave them an update on her condition which includes decompensated cirrhosis, renal failure on CRRT, hypoxia with hepatic hydrothorax. Given her poor prognosis the family was unanimous in deciding that they did not want any escalation of care including no CPR, no intubation, no central line placement, and no pressors. They would like to continue the CRRT for now. The family is informing other family members to come to bedside to say goodbye. There is a son in [place] who [name] hasn't seen in many years who will need a letter to help cross the border. The SW will assist with this. I communicated the family's wishes to the ICU fellow and the patient's nurse.

## 2014-10-13 NOTE — Procedures (Signed)
Paula Manning is a 74 year old female patient.    ICD-10-CM ICD-9-CM   1. Hepatic encephalopathy K72.90 572.2   2. Other cirrhosis of liver (HCC) K74.69 571.5   3. Diabetic polyneuropathy associated with type 2 diabetes mellitus (HCC) E11.42 250.60     357.2   4. Nausea and vomiting, unspecified intactability, vomiting of unspecified type R11.2 787.01   5. S/P TIPS (transjugular intrahepatic portosystemic shunt) Z95.828 V45.89   6. Portal vein thrombosis I81 452   7. Difficulty walking R26.2 719.7   8. Oropharyngeal dysphagia R13.12 787.22   9. Type 2 diabetes, uncontrolled, with neuropathy (HCC) E11.40 250.62    E11.65 357.2   10. Essential hypertension I10 401.9   11. Acute respiratory failure with hypoxia (HCC) J96.01 518.81   12. 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 Cirrhosis of liver with ascites, unspecified hepatic cirrhosis type (HCC) K74.60 571.5   14. Ileus of unspecified type (HCC) K56.7 560.1   15. AKI (acute kidney injury) N17.9 584.9     Past Medical History   Diagnosis Date    Migraine headache     Glaucoma     Ascites 06/29/2007     History of paracentesis x 2    Pancreatic cyst 06/29/2007    Cirrhosis ([place]) 06/24/2007     Active on the liver transplant waiting list with a blood type of O positive. 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 Due to Hep C    Osteopenia 06/24/2007    Emphysema 06/24/2007    EV (esophageal varices) ([place]) 06/10/2007    Anemia 06/10/2007    HCV (hepatitis C virus) 06/10/2007     Achieved SVR.    Type II or unspecified type diabetes mellitus with peripheral circulatory disorders, not stated as uncontrolled(250.70) 06/10/2007    HTN 06/10/2007    HTN 06/10/2007     Blood pressure 114/48, pulse 88, temperature 97.4 F (36.3 C), resp. rate 15, height 5\' 1"  (1.549 m), weight 49.1 kg (108 lb 3.9 oz), SpO2 98 %.    Central Line  Date/Time: 10/13/2014 7:12 AM  Performed by: Anastasio Auerbach  Authorized by: Richardson Chiquito  Consent: Verbal  consent obtained. Written consent obtained.  Risks and benefits: risks, benefits and alternatives were discussed  Consent given by: guardian  Patient understanding: patient states understanding of the procedure being performed  Patient consent: the patient's understanding of the procedure matches consent given  Procedure consent: procedure consent matches procedure scheduled  Relevant documents: relevant documents present and verified  Test results: test results available and properly labeled  Site marked: the operative site was marked  Imaging studies: imaging studies available  Required items: required blood products, implants, devices, and special equipment available  Patient identity confirmed: arm band  Time out: Immediately prior to procedure a "time out" was called to verify the correct patient, procedure, equipment, support staff and site/side marked as required.  Indications: vascular access and exchange transfusion  Local anesthetic: lidocaine 1% without epinephrine  Patient sedated: no  Pre-placement Checklist  Hand hygiene by all in room/procedure room: yes  CHG applicator used: yes   30 second scrub and 2 minute dry: yes  Operator wore: sterile gloves, mask with eye shield, sterile gown and hat  Maximum sterile barrier precautions: yes  Sterile field maintained during procedure: Sterile field maintained during procedure  Sterile technique maintained while applying dressing: Yes  CHG impregnated site patch placed: Yes  Dressing Date/Time: 10/13/2014 7:13 AM  Ointment applied: yes  Pre-procedure: landmarks identified  Preparation: skin prepped with chlorhexidine  Insertion side: right   Insertion site: femoral  Patient position: Trendelenburg  Catheter type: triple lumen  Ultrasound guidance: yes  Ultrasound guide placement: needle entry  Indications for ultrasound: safety    Reason for insertion: new indication  Will catheter be used for dialysis: yes  Number of attempts: 1  Successful placement:  yes  Estimated Blood Loss: 10 ml  Post-procedure: line sutured and dressing applied  Assessment: blood return through all parts  Complications: none  Follow-up: portable chest xray ordered    Patient tolerance: Patient tolerated the procedure well with no immediate complications  Comments: Supervised by the pulmonary critical care fellow, Dr. Kendall Flack        Anastasio Auerbach  10/13/2014

## 2014-10-14 DIAGNOSIS — N179 Acute kidney failure, unspecified: Secondary | ICD-10-CM

## 2014-10-14 LAB — CBC WITH DIFF, BLOOD
ANC-Automated: 6.5 10*3/uL (ref 1.6–7.0)
ANC-Manual Mode: 9.5 10*3/uL — ABNORMAL HIGH (ref 1.6–7.0)
Abs Lymphs: 0.6 10*3/uL — ABNORMAL LOW (ref 0.8–3.1)
Abs Lymphs: 0.2 10*3/uL — ABNORMAL LOW (ref 0.8–3.1)
Abs Monos: 0.6 10*3/uL (ref 0.2–0.8)
Abs Monos: 0.8 10*3/uL (ref 0.2–0.8)
Absolute Nucleated RBC: 0.1 10*3/uL (ref 0.1–1.3)
Bands: 3 % (ref 0–15)
Hct: 18.6 % — ABNORMAL LOW (ref 34.0–45.0)
Hct: 21.5 % — ABNORMAL LOW (ref 34.0–45.0)
Hgb: 6.3 gm/dL — CL (ref 11.2–15.7)
Hgb: 7.1 gm/dL — ABNORMAL LOW (ref 11.2–15.7)
Imm Gran %: 1 % (ref <1)
Imm Gran Abs: 0.1 10*3/uL (ref <0.1)
Lymphocytes: 8 % — ABNORMAL LOW (ref 19–53)
Lymphocytes: 2 % — ABNORMAL LOW (ref 19–53)
MCH: 33.7 pg — ABNORMAL HIGH (ref 26.0–32.0)
MCH: 33.2 pg — ABNORMAL HIGH (ref 26.0–32.0)
MCHC: 33.9 % (ref 32.0–36.0)
MCHC: 33 % (ref 32.0–36.0)
MCV: 99.5 um3 — ABNORMAL HIGH (ref 79.0–95.0)
MCV: 100.5 um3 — ABNORMAL HIGH (ref 79.0–95.0)
MPV: 11.3 fL (ref 9.4–12.4)
MPV: 11 fL (ref 9.4–12.4)
Metamyelocytes: 3 %
Monocytes: 8 % (ref 5–12)
Monocytes: 7 % (ref 5–12)
Myelocytes: 2 %
NRBC: 1 /100 WBC (ref 1–8)
Number of Cells Counted: 100
Plt Count: 32 10*3/uL — ABNORMAL LOW (ref 140–370)
Plt Count: 54 10*3/uL — ABNORMAL LOW (ref 140–370)
Plt Est: DECREASED
RBC: 1.87 10*6/uL — ABNORMAL LOW (ref 3.90–5.20)
RBC: 2.14 10*6/uL — ABNORMAL LOW (ref 3.90–5.20)
RDW: 19.9 % — ABNORMAL HIGH (ref 12.0–14.0)
RDW: 20.8 % — ABNORMAL HIGH (ref 12.0–14.0)
Segs: 84 % — ABNORMAL HIGH (ref 34–71)
Segs: 83 % — ABNORMAL HIGH (ref 34–71)
WBC: 7.8 10*3/uL (ref 4.0–10.0)
WBC: 11.1 10*3/uL — ABNORMAL HIGH (ref 4.0–10.0)

## 2014-10-14 LAB — GLUCOSE (POCT)
Glucose (POCT): 113 mg/dL (ref 70–115)
Glucose (POCT): 122 mg/dL — ABNORMAL HIGH (ref 70–115)
Glucose (POCT): 153 mg/dL — ABNORMAL HIGH (ref 70–115)
Glucose (POCT): 180 mg/dL — ABNORMAL HIGH (ref 70–115)

## 2014-10-14 LAB — ARTERIAL BLOOD GAS
BE, Art: -1 mmol/L (ref <=2.3)
BE, Art: -0.2 mmol/L (ref <=2.3)
Flow Rate: 5 L/min
Flow Rate: 15 L/min
HCO3, Art: 23 mmol/L (ref 20–29)
HCO3, Art: 26 mmol/L (ref 20–29)
O2 Sat, Art (Est): 93.4 % (ref 94–100)
O2 Sat, Art (Est): 88.3 % (ref 94–100)
Temp: 36.5 'C
Temp: 36.6 'C
pCO2, Art (T): 38 mmHg (ref 36–46)
pCO2, Art (T): 45 mmHg (ref 36–46)
pCO2, Art (Uncorr): 38 mmHg (ref 36–46)
pCO2, Art (Uncorr): 46 mmHg (ref 36–46)
pH, Art (T): 7.41 (ref 7.35–7.46)
pH, Art (T): 7.37 (ref 7.35–7.46)
pH, Art (Uncorr): 7.4 (ref 7.35–7.46)
pH, Art (Uncorr): 7.37 (ref 7.35–7.46)
pO2, Art (T): 64 mmHg — ABNORMAL LOW (ref 74–109)
pO2, Art (T): 55 mmHg — ABNORMAL LOW (ref 74–109)
pO2, Art (Uncorr): 67 mmHg (ref 74–109)
pO2, Art (Uncorr): 56 mmHg (ref 74–109)

## 2014-10-14 LAB — BASIC METABOLIC PANEL, BLOOD
Anion Gap: 18 mmol/L — ABNORMAL HIGH (ref 7–15)
Anion Gap: 19 mmol/L — ABNORMAL HIGH (ref 7–15)
BUN: 24 mg/dL — ABNORMAL HIGH (ref 6–20)
BUN: 13 mg/dL (ref 6–20)
Bicarbonate: 21 mmol/L — ABNORMAL LOW (ref 22–29)
Bicarbonate: 23 mmol/L (ref 22–29)
Calcium: 8.6 mg/dL (ref 8.5–10.6)
Calcium: 10.1 mg/dL (ref 8.5–10.6)
Chloride: 96 mmol/L — ABNORMAL LOW (ref 98–107)
Chloride: 96 mmol/L — ABNORMAL LOW (ref 98–107)
Creatinine: 1.77 mg/dL — ABNORMAL HIGH (ref 0.51–0.95)
Creatinine: 1.19 mg/dL — ABNORMAL HIGH (ref 0.51–0.95)
GFR: 28 mL/min
GFR: 44 mL/min
Glucose: 113 mg/dL (ref 70–115)
Glucose: 191 mg/dL — ABNORMAL HIGH (ref 70–115)
Potassium: 3.3 mmol/L — ABNORMAL LOW (ref 3.5–5.1)
Potassium: 3.6 mmol/L (ref 3.5–5.1)
Sodium: 135 mmol/L — ABNORMAL LOW (ref 136–145)
Sodium: 138 mmol/L (ref 136–145)

## 2014-10-14 LAB — PHOSPHORUS, BLOOD
Phosphorous: 3.1 mg/dL (ref 2.7–4.5)
Phosphorous: 2.4 mg/dL — ABNORMAL LOW (ref 2.7–4.5)

## 2014-10-14 LAB — CALCIUM, IONIZED BLOOD
Ca, Ionized: 1.02 mMol/L — ABNORMAL LOW (ref 1.13–1.32)
Ca, Ionized: 0.93 mMol/L — ABNORMAL LOW (ref 1.13–1.32)
Ca, Ionized: 0.94 mMol/L — ABNORMAL LOW (ref 1.13–1.32)
Ca, Ionized: 1 mMol/L — ABNORMAL LOW (ref 1.13–1.32)
Ca, Ionized: 0.94 mMol/L — ABNORMAL LOW (ref 1.13–1.32)
Ca, Ionized: 0.98 mMol/L — ABNORMAL LOW (ref 1.13–1.32)

## 2014-10-14 LAB — LIVER PANEL, BLOOD
ALT (SGPT): 30 U/L (ref 0–33)
AST (SGOT): 26 U/L (ref 0–32)
Albumin: 3.4 g/dL — ABNORMAL LOW (ref 3.5–5.2)
Alkaline Phos: 100 U/L (ref 35–140)
Bilirubin, Dir: 1.2 mg/dL — ABNORMAL HIGH (ref <0.2)
Bilirubin, Tot: 2.55 mg/dL — ABNORMAL HIGH (ref <1.20)
Total Protein: 5.3 g/dL — ABNORMAL LOW (ref 6.0–8.0)

## 2014-10-14 LAB — BUN, POSTFILTER
BUN, Postfilter: 17 mg/dL
BUN, Postfilter: 9 mg/dL

## 2014-10-14 LAB — MAGNESIUM, BLOOD
Magnesium: 1.9 mg/dL (ref 1.7–2.6)
Magnesium: 1.8 mg/dL (ref 1.7–2.6)

## 2014-10-14 LAB — CREATININE, PREFILTER
Creat, Prefilter: 1.54 mg/dL
Creat, Prefilter: 1.08 mg/dL

## 2014-10-14 LAB — IONIZED CALCIUM, POSTFILTER
Ca, Ionized, Postfilter: 0.54 mMol/L
Ca, Ionized, Postfilter: 0.46 mMol/L
Ca, Ionized, Postfilter: 0.44 mMol/L
Ca, Ionized, Postfilter: 0.45 mMol/L
Ca, Ionized, Postfilter: 0.46 mMol/L
Ca, Ionized, Postfilter: 0.4 mMol/L

## 2014-10-14 LAB — BICARBONATE, BLOOD: Bicarbonate: 22 mmol/L (ref 22–29)

## 2014-10-14 LAB — STERILE FLUID CULTURE W/GRAM STAIN, AEROBIC
Fluid Culture Result: NO GROWTH
Gram Stain Result: NONE SEEN

## 2014-10-14 LAB — BUN, ULTRAFILTER
BUN, Ultrafilter: 22 mg/dL
BUN, Ultrafilter: 12 mg/dL

## 2014-10-14 LAB — CREATININE, POSTFILTER
Creat, Postfilter: 1.15 mg/dL
Creat, Postfilter: 0.77 mg/dL

## 2014-10-14 LAB — POTASSIUM, BLOOD: Potassium: 4 mmol/L (ref 3.5–5.1)

## 2014-10-14 LAB — BUN, PREFILTER
BUN, Prefilter: 21 mg/dL
BUN, Prefilter: 12 mg/dL

## 2014-10-14 LAB — APTT, BLOOD: PTT: 91 s (ref 25.0–34.0)

## 2014-10-14 LAB — PROTHROMBIN TIME, BLOOD
INR: 2.4
PT,Patient: 26.8 s — ABNORMAL HIGH (ref 9.7–12.5)

## 2014-10-14 LAB — CREATININE, ULTRAFILTER
Creat, Ultrafilter: 1.63 mg/dL
Creat, Ultrafilter: 1.1 mg/dL

## 2014-10-14 MED ORDER — MORPHINE IV BOLUS FROM BAG
2.0000 mg | INTRAMUSCULAR | Status: DC | PRN
Start: 2014-10-14 — End: 2014-10-15
  Administered 2014-10-14 (×2): 2 mg via INTRAVENOUS

## 2014-10-14 MED ORDER — INSULIN GLARGINE 100 UNIT/ML SC SOLN
9.0000 [IU] | Freq: Every evening | SUBCUTANEOUS | Status: DC
Start: 2014-10-14 — End: 2014-10-14

## 2014-10-14 MED ORDER — GLYCOPYRROLATE 0.2 MG/ML IJ SOLN
0.2000 mg | INTRAMUSCULAR | Status: DC | PRN
Start: 2014-10-14 — End: 2014-10-15
  Administered 2014-10-14: 200 ug via INTRAVENOUS
  Filled 2014-10-14: qty 1

## 2014-10-14 MED ORDER — NARCOTIC/MIDAZOLAM DRIP (FOR PYXIS)
Status: DC
Start: 2014-10-14 — End: 2014-10-15
  Filled 2014-10-14: qty 1

## 2014-10-14 MED ORDER — GLYCOPYRROLATE 0.2 MG/ML IJ SOLN
0.4000 mg | INTRAMUSCULAR | Status: DC | PRN
Start: 2014-10-14 — End: 2014-10-14

## 2014-10-14 MED ORDER — SODIUM CHLORIDE 0.9 % IV SOLN
0.0000 mg/h | INTRAVENOUS | Status: DC
Start: 2014-10-14 — End: 2014-10-15
  Administered 2014-10-14: 2 mg/h via INTRAVENOUS
  Filled 2014-10-14: qty 4

## 2014-10-14 MED ORDER — LORAZEPAM 2 MG/ML IJ SOLN
1.0000 mg | INTRAMUSCULAR | Status: DC | PRN
Start: 2014-10-14 — End: 2014-10-14

## 2014-10-14 MED ORDER — DEXTROSE 10 % IV SOLN
INTRAVENOUS | Status: DC
Start: 2014-10-14 — End: 2014-10-14
  Administered 2014-10-14: 09:00:00 via INTRAVENOUS

## 2014-10-14 MED ORDER — FUROSEMIDE 10 MG/ML IJ SOLN
40.0000 mg | Freq: Two times a day (BID) | INTRAMUSCULAR | Status: DC
Start: 2014-10-14 — End: 2014-10-14

## 2014-10-14 MED ORDER — MORPHINE IV BOLUS FROM BAG
2.0000 mg | Freq: Once | INTRAMUSCULAR | Status: AC | PRN
Start: 2014-10-14 — End: 2014-10-14
  Administered 2014-10-14: 2 mg via INTRAVENOUS

## 2014-10-14 MED ORDER — LORAZEPAM 2 MG/ML IJ SOLN
1.0000 mg | Freq: Every evening | INTRAMUSCULAR | Status: DC | PRN
Start: 2014-10-14 — End: 2014-10-14

## 2014-10-14 MED ORDER — SODIUM CHLORIDE 0.9 % IV SOLN
0.0000 mg/h | INTRAVENOUS | Status: DC
Start: 2014-10-14 — End: 2014-10-15
  Administered 2014-10-14: 2 mg/h via INTRAVENOUS
  Filled 2014-10-14: qty 10

## 2014-10-14 MED ORDER — NARCOTIC DRIP (FOR PYXIS) PLACEHOLDER
Status: DC
Start: 2014-10-14 — End: 2014-10-15
  Filled 2014-10-14: qty 1

## 2014-10-14 MED ORDER — MIDAZOLAM IV BOLUS FROM BAG
2.0000 mg | Freq: Once | INTRAMUSCULAR | Status: DC | PRN
Start: 2014-10-14 — End: 2014-10-15

## 2014-10-14 MED ORDER — MIDAZOLAM IV BOLUS FROM BAG
2.0000 mg | INTRAMUSCULAR | Status: DC | PRN
Start: 2014-10-14 — End: 2014-10-15

## 2014-10-14 NOTE — Interdisciplinary (Signed)
Dr. Jacky Kindle text paged for PTT 91.  INR 2.4.

## 2014-10-14 NOTE — Progress Notes (Signed)
PCCM Attending: Patient seen and examined. Labs and imaging reviewed. Agree with the assessment and plan as outlined in the resident's note.     Critical patient:   - Hypoxic resp failure  - Cirrhosis and end stage liver disease  - Hepatic hydrothorax  - AKI on CRRT  - A fib  - coagulopathy    Plan:   - CRRT, antibiotics, D10 infusion, transfusion as needed  - However, onping goals of care dicussion (see advanced care planning tab). May switch to comfort care goals    Critical care time 35 minutes    Katheren Shams, MD

## 2014-10-14 NOTE — Progress Notes (Signed)
Medical ICU   Progress Note    Patient: [place] Hospital Day:   21 days - Admitted on: 09/17/2014     Location: CCU03/CCU03    ID: Ralphine Hinks is a 74 year old female with a history of HCV-2 cirrhosis c/b ascites, EV, HE s/p TIPS (06/28/14 with revision 09/14/14 for pleural effusion), and diuretic-refractory HHT, DM, and HTN who initially presented on 2/13 with AMS in the setting of recent UTI and was found to have diarrhea 2/2 Norovirus, now in the ICU for acute renal failure and acute hypoxic respiratory failure, on CRRT, with decreasing oxygen requirement and improving mentation this morning.     INTERVAL EVENTS/SUBJECTIVE:   - Family meeting held yesterday with Dr. Andreas Ohm  - Plan to continue CRRT for now, but will not escalate care or intubate  - Would prefer to avoid blood products unless emergently needed  - Was very sleepy on exam early this morning  - On re-evaluation, able to respond to questions and follow commands    OBJECTIVE:    Vital Signs:   Latest Entry  Range (last 24 hours)    Temperature: 98.2 F (36.8 C)  Temp  Avg: 96.8 F (36 C)  Min: 96.1 F (35.6 C)  Max: 98.2 F (36.8 C)    Blood pressure (BP): (!) 109/36 mmHg  No Data Recorded    Heart Rate: 88  Pulse  Avg: 81.7  Min: 70  Max: 92    Respirations: 15  Resp  Avg: 13.4  Min: 9  Max: 22    SpO2: 98 %  SpO2  Avg: 98.2 %  Min: 94 %  Max: 100 %       No Data Recorded     Weight - scale: 60.8 kg (134 lb 0.6 oz)  Percentage Weight Change (%): -1.62 %  Blood pressure (BP): (!) 109/36 mmHg  Arterial Line BP (ABP): 120/40 mmHg  Arterial Line MAP (ABP): 66 mmHg  Heart Rate: 88  SpO2: 98 % on 5L NC    Intake/Output       10/13/14 0600 - 10/14/14 0559 10/14/14 0600 - 11-11-14 0559      0600-1759 1800-0559 Total 0600-1759 4627-0350 Total       Intake    I.V.  7725  9185 16910  2802  -- 2802    NG/GT  210  210 420  --  -- --    Total Intake 7935 9395 17330 2802 -- 2802       Output    Urine  61  47 108  0  -- 0    Emesis/NG Output  --   0 0  --  -- --    Stool  --  200 200  50  -- 50    Hemodialysis  7969  8573 16542  2902  -- 2902    Total Output 8030 8820 16850 2952 -- 2952       Net I/O     -95 575 480 -150 -- -150          Physical Exam:  General: elderly appearing woman on BiPAP, unable to follow commands   HEENT: EOMI, PERRL, moist mucous membranes  CV: RRR  Pulm: crackles bilaterally, most prominent in LUL  Abdomen: tense ascites, non-TTP, bowel sounds present  Ext: 4 mm pitting edema bilaterally from ankles to sacrum  Skin: multiple ecchymoses on bilateral forearms with mepilex covering wound on L forearm  GU:  foley catheter intact    Current Antibiotics:   IV vancomycin (2/23-2/28, restarted 3/3-)  IV zosyn (2/23-2/28, restarted 3/3-)    Continuous Medications:   calcium gluconate infusion (CRRT) 70 mL/hr at 10/14/14 0700    dextrose 10% 50 mL/hr at 10/14/14 0857    PRISMASATE with Calcium CRRT dialysate 1,000 mL/hr at 10/14/14 0914    CRRT Replacement - sodium bicarbonate 150 mEq/L in sterile water replacement fluid      CRRT Replacement - sodium bicarbonate 75 mEq/L in NaCl 0.45%      CRRT Machine Post-Filter Fluid - sodium chloride 0.9% infusion      CRRT Machine Pre-Filter Fluid - sodium chloride 0.9% infusion      CRRT Replacement - sodium chloride 0.9% infusion      sodium chloride      sodium citrate 110 mL/hr (10/14/14 0715)     Scheduled Medications:   diltiazem  30 mg Q6H    famotidine  20 mg Daily    insulin glargine  9 Units HS    insulin regular  1-10 Units Q6H    lactulose  20 g TID    metoclopramide  5 mg Q12H    midodrine  10 mg TID    multivitamin  5 mL Daily    octreotide  200 mcg TID    piperacillin-tazobactam (ZOSYN) IVPB  3,375 mg Q8H    rifaximin  550 mg Q12H    sodium chloride (PF)  3 mL Q8H    sodium citrate  4 mL As Directed    tiotropium  1 capsule Daily    vancomycin (VANCOCIN) IVPB  600 mg Q12H NR     PRN Medications:   albuterol  2.5 mg PRN    calcium gluconate  1 g PRN    dextrose   12.5 g PRN    fentaNYL  12.5 mcg Q1H PRN    fentaNYL  25 mcg Q1H PRN    glucagon  1 mg Once PRN    glucose  4 tablet PRN    glucose  1 Tube PRN    guaiFENesin-dextromethorphan  10 mL Q4H PRN    magnesium sulfate  2 g PRN    magnesium sulfate  2 g PRN    magnesium sulfate  2 g PRN    nalOXone  0.1 mg Q2 Min PRN    phosphorus  250 mg PRN    phosphorus  500 mg PRN    potassium chloride  20 mEq PRN    potassium chloride  20 mEq PRN    potassium chloride  20 mEq PRN    prochlorperazine  10 mg Q6H PRN    simethicone  80 mg Q6H PRN    sodium chloride (PF)  10 mL PRN    sodium chloride (PF)  3 mL PRN    sodium chloride   Continuous PRN    sodium PHOSphate  10 mEq PRN         Labs:   Recent Labs      10/12/14   1330  10/13/14   0153  10/13/14   0900  10/13/14   1438   ARTPH  7.26*  7.26*  7.22*  7.32*   ARTPCO2  35*  35*  36  39   ARTPO2  62*  83  95  87     Recent Labs      10/12/14   0446  10/13/14   0500  10/13/14   0854  10/13/14  1437  10/14/14   0135   WBC  13.2*  13.8*  13.0*  12.1*  7.8   HGB  8.1*  7.2*  7.3*  6.7*  6.3*   HCT  23.8*  21.1*  21.8*  19.6*  18.6*   PLT  94*  86*  83*  59*  32*     Recent Labs      10/12/14   0659   10/13/14   0500   10/13/14   0854  10/13/14   1437  10/13/14   1944  10/14/14   0135   NA  129*   --   129*   --   129*  132*   --   135*   K  3.9   --   4.0   --   4.0  3.3*   --   3.3*   CL  96*   --   99   --   99  94*   --   96*   BICARB  13*   --   15*   --   13*  18*  19*  21*   BUN  44*   --   46*   --   49*  36*   --   24*   CREAT  2.31*   --   2.79*   --   2.99*  2.35*   --   1.77*   GLU  230*   --   40*   --   76  126*   --   113   [place]  8.9   --   7.8*   --   7.9*  8.8   --   8.6   PHOS  5.6*   --    --    --   6.4*  4.8*   --   3.1   MG  3.0*   --   2.7*   --   2.8*  2.4   --   1.9   TP   --    < >  6.1   --   6.1  6.2   --   5.3*   ALB   --    < >  4.0   --   3.8  4.1   --   3.4*   AST   --    < >  37*   --   35*  28   --   26   ALT   --    < >  39*   --    38*  34*   --   30   ALK   --    < >  113   --   111  103   --   100   DBILI   --    --    --    < >  1.2*  1.3*   --   1.2*   TBILI   --    < >  2.17*   --   2.27*  2.69*   --   2.55*    < > = values in this interval not displayed.       Microbiology:  Pleural fluid culture (10/08/2014): NGTD  Urine culture (10/11/14): NGTD  Pleural fluid culture (10/12/14): pending    Imaging:  CXR (10/14/14): per note author interpretation- decreased pulmonary edema, moderate right sided pleural effusion  ASSESSMENT/PLAN:  Esperanza Madrazo is a 74 year old female with a history of HCV-2 cirrhosis c/b ascites, EV, HE s/p TIPS (06/28/14 with revision 09/14/14 for pleural effusion), and diuretic-refractory HHT, DM, and HTN who initially presented on 2/13 with AMS in the setting of recent UTI and was found to have diarrhea 2/2 Norovirus, now in the ICU for acute renal failure and acute hypoxic respiratory failure, on CRRT, with decreasing oxygen requirement and improving mentation this morning.     # Acute renal failure: c/b metabolic acidosis and oliguria, most likely HRS. Now on CRRT (started 3/4).with improved acidosis.   - Appreciate nephrology recommendations  - Continue CRRT for now  - Will tentatively plan on another goals of care discussion once patient's sons who live in [place] are able to reunite with the patient    # Acute hypoxic respiratory failure: 2/2 refractory hepatic hydrothorax and possible aspiration PNA. Now resolving, s/p thoracentesis (3/3), continuing IV antibiotics.  - Continue IV vanc/zosyn (2/23-2/28, restarted 3/3-)   - Continue albumin 25 g BID     # AMS: improving mental status this morning. More alert and able to follow commands, still drowsy likely 2/2 ongoing encephalopathy vs renal failure.   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   - Reglan     # HCV GT2 cirrhosis:  c/b ascites, HE, and diuretic resistant HHT, s/p TIPS 06/28/2014 and TIPS revision 09/14/14. MELD score 33 points.   - Appreciate ongoing hepatology recommendations  - HE: lactulose, titrate to 3-4 BM/day + rifaximin  - Ascites: IV lasix 20 mg qdaily + spironolactone 100 mg qdaily  - EV: s/p TIPS. Octreotide 200 mcg TID  - HHT: s/p TIPS and revision  - Portal vein thrombosis, non-occlusive: anticoagulation relatively contraindicated given medication non-adherence and fall risk  - HCC surveillance: CT 06/27/14 negative for HCC. Due for imaging in May 2016  - Transplant: not candidate    # Recent UTI: no growth on repeat cultures.  - IV zosyn as above  - f/u urine culture    # Afib: rate controlled.   - Continue diltiazem  - Anticoagulation relative contraindication as above    # DM2: initially hypoglycemic, now normoglycemic after starting D5W. Will hold lantus until evening and reassess need for insulin.   - Change lantus 16 units before lunch to lantus 9 units at bedtime   - May not need basal insulin if remains normoglycemic during day  - Continue moderate intensity sliding scale   - Change D5W @ 100 ml/h to D10W @ 50 ml/h to reduce fluid intake    F: NPO, D10W  A: Fentanyl  S: None  T: ICDs H: >30 degrees  U: Famotidine  G: Insulin SQ  S: Lactulose     Code: Full code, full resuscitation    Patient Lines/Drains/Airways Status    Active PICC Line / CVC Line / PIV Line / Drain / Airway / Intraosseous Line / Epidural Line / ART Line / Line Type / Wound     Name: Placement date: Placement time: Site: Days:    PICC Double Lumen - Power PICC Right Brachial 10/12/14  1350  Brachial  1    HD/Pheresis Catheter - Vas Cath - Triple Lumen Right Femoral vein 10/13/14  0600  Femoral vein  1    NG/OG Tube -  Nasoenteric feeding tube Left  nostril 10/13/14  1100  Left nostril  less than 1    Indwelling Urinary Catheter -  09/18/2014 1150 RN Standard (Latex) 10 ml Yes 09/25/2014  1150  Standard (Latex)  4    Arterial Line (ABP)  -   10/13/14 0630 Right Femoral 10/13/14  0630  Femoral  1    Impaired Skin Integrity -  Skin tear Arm Left 10/05/14  0800  Arm  9                Patient was seen and discussed with the attending physician, Dr. Katheren Shams, MD who agrees with my assessment and plan.    Hessie Knows, PGY-1  Internal Medicine  Pager [phone]

## 2014-10-14 NOTE — Goals of Care (Signed)
At 1530, notified [facility] that pt's code status had changed to comfort care. Due to positive HCV status, Paula Manning navy officer) states pt is ruled out for all organ donation and requested that we call back with time of death.

## 2014-10-14 NOTE — Plan of Care (Signed)
Problem: Falls, Risk of  Goal: Keep patient free from falls utilizing universal fall precautions  Outcome: Met  Goal: Minimize the risk of falls due to the effects of prescribed medications (Medications)  Outcome: Met    Problem: Tissue Perfusion, Cardiopulmonary - Altered  Goal: Hemodynamic stability  Outcome: Met  Goal: Early recognition of deterioration  Outcome: Met    Problem: Pain - Acute  Goal: Communication of presence of pain  Outcome: Met  Goal: Control of acute pain  Outcome: Met    Problem: Alteration in Blood Glucose  Goal: Glucose level within specified parameters  Outcome: Met    Problem: Infection  Goal: Absence of infection signs and symptoms  Outcome: Met    Problem: Skin Integrity- Intact patient high risk for impaired skin integrity Braden scale less than or equal to 18  Goal: Skin remains intact  Outcome: Met    Problem: Aspiration, Risk of  Goal: Evidence of swallowing difficulties is absent  Outcome: Met    Problem: Breathing Pattern - Ineffective  Goal: Respiratory rate, rhythm and depth return to baseline  Outcome: Met    Problem: Nutrition Deficit  Goal: Adequate nutritional intake  Outcome: Not Met

## 2014-10-14 NOTE — Goals of Care (Signed)
GOALS OF CARE / ADVANCE CARE PLANNING CONVERSATION NOTE    A discussion was held with the patient and/or their surrogate decision maker regarding advanced care planning and goals of care.    Date and time of goals of care discussion:  10/14/14    Persons present:  The patient's two sons, her ICU nurse, and I.    What is the name of the decision-maker: Shared between sons and husband.    Has the patient made their wishes clear to their surrogate decision maker?  Yes    The following topics were discussed:  patient's wishes and desires, prognosis and disease trajectory    Details of discussion (address all topics identified above):      I spoke with the two sons, both of whom preferred brevity so they could spend more time with their mother.  They asked about prognosis, and I explained that given her recent increase in oxygen requirement and her overall end-stage disease that she may have as little as minutes, but likely hours to live.  They made it clear that once the husband was here, that Paula Manning would want only measures to keep her comfortable.  I explained that there would likely be modest to no difference in overall prognosis as result of this.  I explained removing oxygen and providing morphine for comfort.  They expressed their understanding.    What are the patient's priorities regarding quality of life?  Comfort.    What are the anticipated outcomes, and were they communicated to the patient and/or surrogate decision maker?  As noted above.    After today's discussion, the patient's code status is confirmed as:  Do Not Attempt Resuscitation / Full Comfort Care Measures..  This will be instituted once the husband is here and the family lets the nurse know.  Until then, it is Do Not Attempt Resuscitation / Limited Intervention (no lines, pressors, intubation, procedures).    Total time spent face-to-face with patient and/or surrogate conducting advance care planning:  15 minutes.    Brent General,  M.D.  [facility], PGY-2

## 2014-10-14 NOTE — Plan of Care (Signed)
Problem: Pain - Acute  Goal: Communication of presence of pain  Outcome: Met  Pt placed on comfort care at 1530. CRRT discontinued and NG tube was removed. Pt started on morphine and versed drip. Pt given boluses for comfort per titration schedule. Pt also given glycopyrulate for comfort. Will continue to monitor for pain via CPOT scale. Also enlisted help of family to let me know if pt appears in pain or uncomfortable.   Goal: Control of acute pain  Outcome: Met    Problem: Coping - Ineffective, Patient and Family  Goal: Effective or improved coping  Outcome: Met  Pt met with MICU team regarding goals of care. Pt desaturated and required higher oxygen support. See goals of care note. Family decided to change status to comfort care.   Goal: Knowledge of available hospital resources.  Outcome: Met

## 2014-10-14 NOTE — Progress Notes (Signed)
Endocrine/Diabetes Progress Note    Overnight:  Normoglycemic this am on D5% at 100 mL/hr    Objective:   Temperature:  [96.4 F (35.8 C)-98.4 F (36.9 C)] 98.4 F (36.9 C) (03/05 1100)  Heart Rate:  [70-102] 96 (03/05 1223)  Respirations:  [9-24] 19 (03/05 1223)  Pain Score:  [-] CPOT (03/05 1204)  O2 Device:  [-] Nasal cannula (03/05 1212)  O2 Flow Rate (L/min):  [5 l/min-10 l/min] 10 l/min (03/05 1221)  SpO2:  [87 %-100 %] 93 % (03/05 1223)  Body mass index is 25.34 kg/(m^2).    Diet: NPO    Blood Sugars reviewed:        AM  Lu  Di  HS  O/N    2/28  114 162 180 159  Lantus   35  Lispro  8+6 8+3 6+3    2/29  88 113 231 213  Lantus   28  Lispro  12+8  4+6 +1    3/1  227 265 329 251  Lantus   28  Lispro  8+6 6+8+8 6+8+10  6    3/2  300 303 334 288  Lantus   32   Lispro  8+6 10+8 +10 +6    3/3  232 271 179 173  Lantus   32  Lispro  10+6 +8 +4    3/4  40 98 120 122  Lantus   16  Lispro    3/5  113 153  Lantus  Regular  1     A/P:  Paula Manning is a 74 year old female with uncontrolled DM2 c/b neuropathy as well as HepC cirrhosis, HTN, glaucoma and migraines admitted 10/08/2014 w/ n/v and AMS.     Pt normoglycemic this am, recommend weaning dextrose IVF and decreasing basal insulin. Would reschedule basal insulin to HS and hold if pt continues to have glucose < 150 mg/dL      Inpatient Recs: d/w pharmacist [name]  - continue to wean dextrose IVF before increasing insulin doses  - Reduce and retime Lantus to 9 units nightly, would hold dose if glucose consistently below 150 mg/dL  - continue low regular correction scale q 6 hours  - low threshold for insulin drip in critical care setting, but if goals moving towards comfort can cont to titrate down on scheduled insulin    Outpatient Recs:  Likely no meds/monitoring given poor prognosis    will cont to follow

## 2014-10-14 NOTE — Progress Notes (Signed)
INPATIENT NEPHROLOGY PROGRESS NOTE    EVENTS:  - Filter clotting. Off CRRT for 2 hours.      SUBJECTIVE:  Intubated. Sedated.    MEDICATIONS:   diltiazem  30 mg Q6H    famotidine  20 mg Daily    insulin glargine  9 Units HS    insulin regular  1-10 Units Q6H    lactulose  20 g TID    metoclopramide  5 mg Q12H    midodrine  10 mg TID    multivitamin  5 mL Daily    octreotide  200 mcg TID    piperacillin-tazobactam (ZOSYN) IVPB  3,375 mg Q8H    rifaximin  550 mg Q12H    sodium chloride (PF)  3 mL Q8H    sodium citrate  4 mL As Directed    tiotropium  1 capsule Daily    vancomycin (VANCOCIN) IVPB  600 mg Q12H NR         calcium gluconate infusion (CRRT) 70 mL/hr at 10/14/14 0700    dextrose 10% 50 mL/hr at 10/14/14 0857    PRISMASATE with Calcium CRRT dialysate 1,000 mL/hr at 10/14/14 0914    CRRT Replacement - sodium bicarbonate 150 mEq/L in sterile water replacement fluid      CRRT Replacement - sodium bicarbonate 75 mEq/L in NaCl 0.45%      CRRT Machine Post-Filter Fluid - sodium chloride 0.9% infusion      CRRT Machine Pre-Filter Fluid - sodium chloride 0.9% infusion      CRRT Replacement - sodium chloride 0.9% infusion      sodium chloride      sodium citrate 110 mL/hr (10/14/14 0715)       ALLERGY:  No Known Allergies      OBJECTIVES:  VITAL SIGNS:  Temperature:  [96.1 F (35.6 C)-98.2 F (36.8 C)] 98.2 F (36.8 C) (03/05 0400)  Heart Rate:  [70-92] 88 (03/05 0700)  Respirations:  [9-22] 15 (03/05 0700)  Pain Score:  [-] Anticipated Pain (03/05 0827)  O2 Device:  [-] Nasal cannula (03/04 2006)  O2 Flow Rate (L/min):  [5 l/min-8 l/min] 5 l/min (03/04 2006)  SpO2:  [94 %-100 %] 98 % (03/05 0700)      03/04 0600 - 03/05 0559  In: 17330 [I.V.:16910]  Out: 89211 [Urine:108]       Weights (last 3 days)     Date/Time Weight Who    10/14/14 0100 60.8 kg (134 lb 0.6 oz) SD    10/13/14 0750 61.8 kg (136 lb 3.9 oz) MM    10/12/14 0400 49.1 kg (108 lb 3.9 oz) PM    10/11/14 0400 50 kg (110 lb 3.7  oz) IR              Lab Results   Component Value Date    ARTPH 7.41 10/14/2014    ARTPO2 64* 10/14/2014    ARTPCO2 38 10/14/2014         PHYSICAL EXAM:  GEN: elderly, ill woman laying in bed, no in distress, not alert and oriented, awake  HEENT: normocephalic, scleral icterus  NECK: JVD  CARDIOVASCULAR: RRR, no murmurs, rubs, or gallops.   PULMONARY: Coarse breath sounds throughout  ABDOMEN: ND, soft, mild distended,+BS, NT, no rebound tenderness, no guarding  UE: 2 mm pitting edema  LE: 5-6 mm pitting edema up to bilateral hips  SKIN: jaundice, bruises on arms      LAB STUDIES:  Recent Labs  10/12/14   8756  10/13/14   0500  10/13/14   0854  10/13/14   1437  10/13/14   1944  10/14/14   0135  10/14/14   0640  10/14/14   0810   NA  129*  129*  129*  132*   --   135*   --    --    K  3.9  4.0  4.0  3.3*   --   3.3*  4.0   --    CL  96*  99  99  94*   --   96*   --    --    BICARB  13*  15*  13*  18*  19*  21*   --   22   BUN  44*  46*  49*  36*   --   24*   --    --    CREAT  2.31*  2.79*  2.99*  2.35*   --   1.77*   --    --    ALB   --   4.0  3.8  4.1   --   3.4*   --    --    [place]  8.9  7.8*  7.9*  8.8   --   8.6   --    --    PHOS  5.6*   --   6.4*  4.8*   --   3.1   --    --    MG  3.0*  2.7*  2.8*  2.4   --   1.9   --    --      Recent Labs      10/12/14   0446  10/13/14   0500  10/13/14   0854  10/13/14   1437  10/14/14   0135   WBC  13.2*  13.8*  13.0*  12.1*  7.8   HCT  23.8*  21.1*  21.8*  19.6*  18.6*   HGB  8.1*  7.2*  7.3*  6.7*  6.3*   PLT  94*  86*  83*  59*  32*   SEG  88*  88*  87*  87*  84*   MCV  98.8*  100.0*  100.0*  97.5*  99.5*         IMPRESSION:  74 year old woman with h/o HCV cirrhosis s/p TIPS with recurrent hepatic hydrothorax and hepatic encephalopathy, HTN, DM Type II, admitted for altered mental status on 09/22/2014. Now, the patient is found to have anuric AKI and respiratory failure.    #Anuric AKI  - Remains with minimal urine output  - Electrolytes: Hypokalemia. K will be adujsted  on dialysate to 7 K.   - Acid/base: At target with CRRT  - Volume status: Hypervolemic    CVVHDF Rx  Expected clearance: 45 mL/min   Access: vas-cath   Blood flow: 100 mL/min   Dialysate flow: 1 L/hr   UF rate: 1 L/hr   Prefilter fluid: NS at 500 mL/min   Postfilter fluid: NS at 200 mL/min   Total effluent: 2.7 L/hr   Anticoagulation: Regional citrate   iCa goals: Peripheral 0.95-1.05, Post-filter 0.35-0.45  Current citrate drip: 110 mL/hr   Current [place] drip: 70 mL/hr   Dialysate: Prismasate: 7 K, 2.5 Mg, 2.5 [place], 32HCO3 (mEq/L)   Replacement fluids: bicarb scale, midrange 18-24.  Fluid goal: Net negative 50 mL/hr    RECOMMENDATIONS:  -  Continue goals of care discussion  - Pt will remain on CRRT  - Please do not discontinue electrolyte replacement scales while on CRRT  - Dose all meds for CrCl < 45 mL/min     Discussed with attending Dr. Peterson Ao who agreed with the assessment and plan.    Leota Sauers, MD  Nephrology Fellow  Pager [phone]

## 2014-10-14 NOTE — Interdisciplinary (Signed)
10/14/2014 Weekend Social Services Consult - received page from [employer] - requesting assistance w/ [place] letter due to pt.'s critical condition w/ prospectus of imminent death. Letter provided by Financial Counselor was provided however w/ incorrect spelling of son's name. Informed FC - Antoine([phone]) who states he will attempt to rectify letter however informed that Director of Financial Dept is not in-house to sign letter today. Spouse will attempt to provide new letter to son in hopes he will be able to enter the [place] from [place] & spend time w/ pt at bedside before spouse makes decision to withdraw care (CRRT). Weekday LCSW to follow-up w/ family - otherwise   please re-consult Social Services for any acute issues or changes.--Paula Manning, [place]

## 2014-10-14 NOTE — Interdisciplinary (Signed)
Received report from [place]. Reviewed plan of care, head to toe assessment, and gtts. Family at bedside and addressed questions/concerns. Pt appears comfortable and comfort measures provided as appropriate. Encouraged pt's family to ask for assistance when needed.

## 2014-10-16 LAB — HEPATITIS B SURFACE AG, BLOOD: HBsAg: NONREACTIVE

## 2014-10-16 LAB — PATHOLOGY REVIEW

## 2014-10-16 LAB — HEPATITIS C AB, BLOOD: Hepatitis C Ab: REACTIVE

## 2014-10-18 LAB — BODY FLUID CELL CT / DIFF
Fluid RBC mm3: 48000 uL
Fluid WBC mm3: 502 uL
Lymphocytes fluid %: 13 %
Macrophages fluid %: 20 %
Mesothelial Cells Fluid %: 3 %
Neutrophils Fluid %: 54 %
Other Cells Fluid %: 10 %

## 2014-10-18 LAB — STERILE FLUID CULTURE W/GRAM STAIN, AEROBIC
Fluid Culture Result: NO GROWTH
Gram Stain Result: NONE SEEN

## 2014-10-18 LAB — PATHOLOGY REVIEW

## 2014-10-18 LAB — BLOOD CULTURE: Blood Culture Result: NO GROWTH

## 2014-10-18 NOTE — Procedures (Signed)
SPECIMENS SUBMITTED:  A Pleural Fluid    CLINICAL INFORMATION:  Clinical findings 33: 74 year old female with hydrothorax    GROSS DESCRIPTION:  80ml, cloudy red fluid.    FINAL CYTOPATHOLOGIC DIAGNOSIS:  No Atypical or Malignant Cells  Acute inflammation  Chronic Inflammation    COMMENT:  Wright stained [Diff-Quik] smear(s) examined  Cell block examined  CONFIDENTIAL HEALTH INFORMATION: Health Care information is personal and  sensitive information. If it is being faxed to you it is done so under  appropriate authorization from the patient or under circumstances that do  not require patient authorization. You, the recipient, are obligated to  maintain it in a safe, secure and confidential manner. Re-disclosure  without additional patient consent or as permitted by law is prohibited.  Unauthorized re-disclosure or failure to maintain confidentiality could  subject you to penalties described in federal and state law.  If you have  received this report or facsimile in error, please notify the [facility] immediately and destroy the received document(s).    Material reviewed and Interpreted and  Report Electronically Signed by:  Ronnell Guadalajara M.D. [phone])  Attending Surgical Pathologist  10/18/14 11:41  Electronic Signature derived from a single  controlled access password

## 2014-10-26 ENCOUNTER — Ambulatory Visit (HOSPITAL_BASED_OUTPATIENT_CLINIC_OR_DEPARTMENT_OTHER): Payer: MEDICAID | Admitting: Family

## 2014-10-30 ENCOUNTER — Encounter (INDEPENDENT_AMBULATORY_CARE_PROVIDER_SITE_OTHER): Payer: Medicare Other

## 2014-10-30 ENCOUNTER — Ambulatory Visit (INDEPENDENT_AMBULATORY_CARE_PROVIDER_SITE_OTHER): Payer: Medicare Other | Admitting: Ophthalmology

## 2014-11-10 NOTE — Plan of Care (Signed)
Problem: Pain - Acute  Goal: Communication of presence of pain  Outcome: Met  Comfort care was provided to the pt and the family members. Support provided through the shift.     Problem: Coping - Ineffective, Patient and Family  Goal: Effective or improved coping  Outcome: Met  Family members present at bedside. Coped effectively and appropriately with pt's death.

## 2014-11-10 NOTE — Progress Notes (Signed)
DEATH NOTE    Circumstances of Determination of Death  I was called by the nursing staff to confirm that the patient had expired.      The patient was determined to have expired, based on the following findings:  The patient did not show any visible signs of spontaneous breathing.  The patient did not have any palpable carotid or peripheral pulses.  There were no audible heart sounds upon auscultation.  Pupillary light response were absent, and the pupils were fixed and dilated.    The time of death was recorded as 98.    Family and Caregiver Notification  The patient's family was present at the bedside at the time of the patient's death.    Autopsy Request and Organ Donation  Permission to obtain an autopsy was refused.    I have not yet contacted the organ donation service to review the case for any potential organ donations.

## 2014-11-10 NOTE — Discharge Summary (Signed)
Paula Manning was admitted to [facility] on 09/17/2014.  The patient expired during the hospital stay.  (Please refer to the Death Note for the events on the day of death.)    Principal Diagnosis (required):  Hepatic encephalopathy, Acute renal failure, hypoxemic respiratory failure    Hospital Problem List (required):  Active Hospital Problems    Diagnosis    *Hepatic encephalopathy (HCC) [Paula Manning]    AKI (acute kidney injury) [Paula Manning]    Nausea and vomiting [R11.2]    S/P TIPS (transjugular intrahepatic portosystemic shunt) [Paula Manning]    Diabetic polyneuropathy associated with type 2 diabetes mellitus ([place]) [E11.42]    Type 2 diabetes, uncontrolled, with neuropathy (HCC) [E11.40, E11.65]    Cirrhosis of liver ([place]) [Paula Manning]    Hypertensive disorder [place] [facility] Problems    Diagnosis   No resolved problems to display.       Additional Hospital Diagnoses ("rule out" or "suspected" diagnoses, etc.):  Atrial Fibrillation, Hypertension, Type 2 Diabetes Mellitus,    Principal Procedure During This Hospitalization (required):  CRRT  Thoracentesis  Central line    Other Procedures Performed During This Hospitalization (required):  CT imaging of abdomen, chest    Consultations Obtained During This Hospitalization:  Endocrinology  Hepatology  Interventional Radiology  Nephrology  Pulmonary Medicine   Cardiothoracic Surgery    Endocrinology  Inpatient Recs: d/w pharmacist Paula Manning  - continue to wean dextrose IVF before increasing insulin doses  - Reduce and retime Lantus to 9 units nightly, would hold dose if glucose consistently below 150 mg/dL  - continue low regular correction scale q 6 hours  - low threshold for insulin drip in critical care setting, but if goals moving towards comfort can cont to titrate down on scheduled insulin  Outpatient Recs:  Likely no meds/monitoring given poor prognosis    Hepatology  Held a meeting with the family this afternoon. Paula Manning provided Paula Manning  interpreter support. ICU social worker, nursing student also present along with patient's husband, 2 sons, and 3 daughters. I gave them an update on her condition which includes decompensated cirrhosis, renal failure on CRRT, hypoxia with hepatic hydrothorax. Given her poor prognosis the family was unanimous in deciding that they did not want any escalation of care including no CPR, no intubation, no central line placement, and no pressors. They would like to continue the CRRT for now. The family is informing other family members to come to bedside to say goodbye. There is a son in [place] who Paula Manning hasn't seen in many years who will need a letter [place] help [place]. The SW will assist with this. I communicated the family's wishes to the ICU fellow and the patient's nurse.    Nephrology  -Discussed with her family (her husband, daughter, and son-in-law) about her poor prognosis and dialysis modalities  -The family agreed with starting CRRT for one week for electrolyte, acid/base, and fluid management and will revisit goals of care  -Obtain consent for CRRT or intermittent hemodialysis    Cardiothoracic Surgery  1. Recurrent hepatic hydrothorax. S/p thoracentesis on 2/22, 2/26, 2/29. Still c/o dyspnea. Recommend CT chest non contrast.  2. HCV, cirrhosis with ascietes, encephalopathy and EV. S/p Recent TIPS and revision. Continue care per Hepatology/medicine.  3. Afib controlled rate with oral diltiazem  4. DM- on insulins  5. AKI- ? r/t overdiuresis    Pulmonary Medicine  Paula Manning is a 74 year old female with H/o HCV  Cirrhosis c/b Hepatic encephalopathy s/p TIPSS placement and Hepatic hydrothorax s/p multiple IR guided thoracenteses last on 2/22. Patient is also being diuresed by the primary team, but developed worsening pleural effusion nevertheless. Plan was for thoracentesis this afternoon after coagulopathy was reversed by [facility]. However, it seems like patient is going to undergo another IR-guided  thoracentesis today. Please let us know if we can assist with anything in the future.     Reason for Admission to the Hospital / Initial Presentation:  Altered Mental Status    Per H/P  "Paula Manning is a 74 year old Spanish-speaking woman with pmh of HCV (GT2, achieved SVR), Cirrhosis (c/b ascites, HE, EV) s/p TIPS (06/2014) and recent TIPS revision (09/14/14), DM2, HTN, and recent UTI treated with 4 day course of abx by non-[facility] PCP who presents with 4 days of nausea, vomiting, and 1 day of altered mental status.    History was obtained via interpretation from her son. She was in her usual state of health until 4 days ago when she acutely developed nausea and vomiting. She in unsure of the cause and denies having any strange or undercooked foods, sick contacts, or recent travel. She denies abdominal pain at that time but now reports reflux irritation from the multiple bouts of vomiting. She denies hematemesis, melena, and hematochezia. Her PO intake was poor for the 4 days prior to admission.    One day prior to admission, her family noticed that she appeared altered. She was unable to focus on the television, answer basic questions, and she was very unsteady on her feet requiring help from family to ambulate (not her usual). Notably, she has not taken her lactulose in the past 2 days because of one small episode of loose stool.    She denies chest pain, cough, headache, neck stiffness, dysuria, or hematuria.   Upon presentation to the ED, she was hemodynamically stable, given a dose of Ceftriaxone for presumed UTI as well as lactulose. When seen by the internal medicine team in the ED, she was alert and oriented."    Hospital Course (required):    #Hypoxemic Respiratory Failure-likely combination of underlying COPD with pleural effusion and pulmonary edema secondary to cirrhosis (hepatic hydrothorax). Patient was treated for pneumonia and therapeutic thoracentesis without resolution of symptoms. Eventually family  meeting was held and patient was placed on comfort care.    #Altered Mental Status-patient brought in 09/22/2014 secondary to altered mental status, thought multifactorial and secondary to hepatic encephalopathy and ongoing infections. Patient was started on treatment, including lactulose and rifaximin with improvement on therapy.     #Cirrhosis-patient was treated with lactulose and rifaximin. Diuresed well with lasix and aldactone. No acute bleeding from varices was noted/diagnosed during hospitalization. Patient had diagnostic paracentesis performed, showing no bacterial peritonitis. Not transplant candidate.    #Norovirus Infection-thought contributing to ongoing nausea/vomiting, abdominal pain, and diarrhea. Patient was treated with supportive measures.    #Urinary Tract Infection/Pyuria-treated with ceftriaxone    #Acute Renal Failure-acute anuria likely secondary to hepatorenal syndrome from worsening cirrhosis and portal hypertension. Patient was placed on continuous renal replacement therapy     #Atrial Fibrillation-patient suffered Atrial Fibrillation with rapid ventricular rate and was treated with diltiazem; no anticoagulation given per discussion with family.      Discharge Condition (required):  Expired.    Autopsy Request:  Permission to obtain an autopsy was refused.    Contact Information for Discharging Physician:  [facility] operator at [phone].

## 2014-11-10 DEATH — deceased

## 2015-05-11 IMAGING — CR FOREARM LT 2 VWS
1 series · 2 of 2 positions shown · non-contrast
Comparison: Left hand and wrist from the same day.

Images Obtained from [facility]
HISTORY/INDICATIONS:   Fall one day ago from chair resulting in left upper extremity pain.
TECHNIQUE: Left forearm, 2 views.

[Series 1: lat · 0.17mm/px · 2 of 2 slices shown]
[im 1/2]
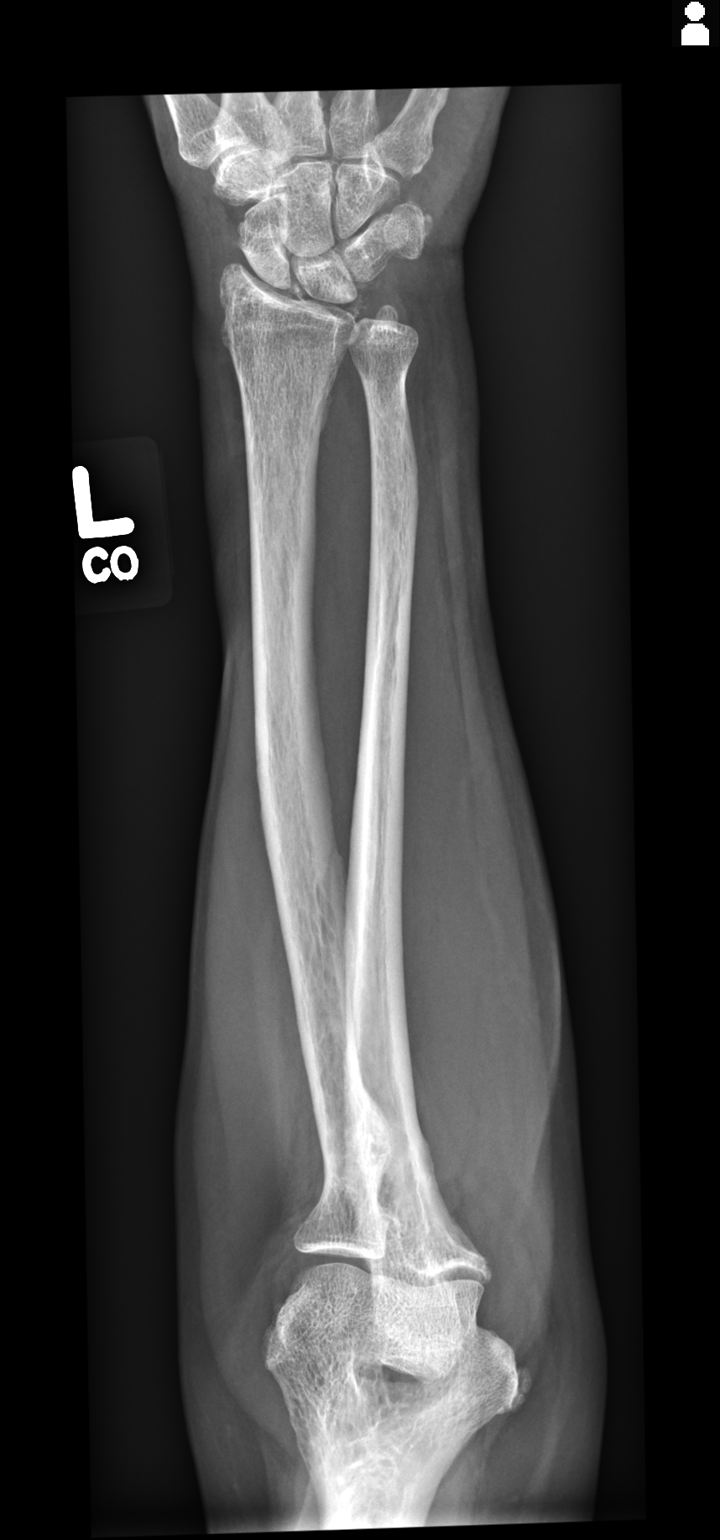
[im 2/2]
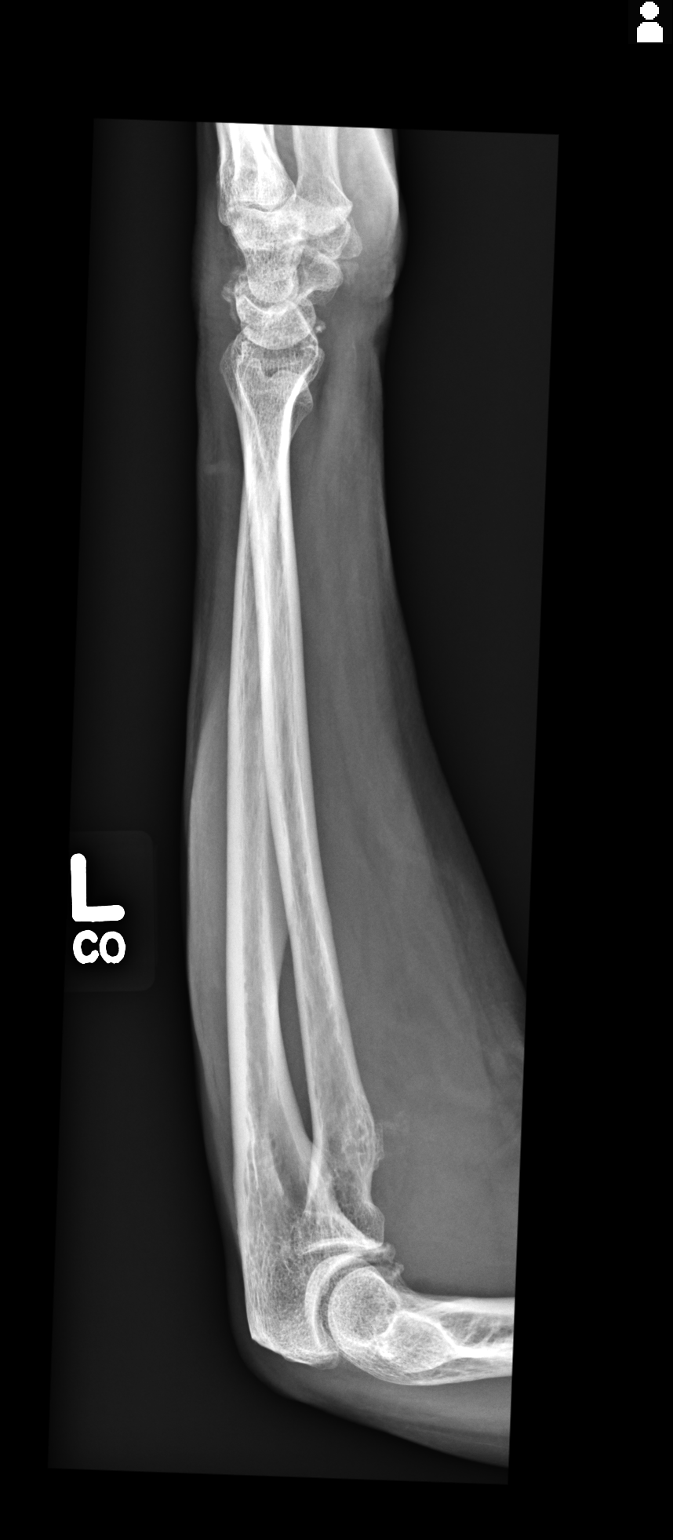

[2 of 2 positions shown; findings below may reference images not displayed]

FINDINGS: Soft tissue calcification at the distal radioulnar joint proximal carpal row is again seen. In particular, there is no evidence of fracture, dislocation, or radiopaque foreign body seen in
the left radius or ulna. Increased soft tissue density at the dorsal forearm may represent underlying soft tissue contusion. Elbow joint is intact and free of effusion.
IMPRESSION: 1. No convincing fractures or dislocations.
2. Suspected dorsal soft tissue contusion in the mid left forearm seen on the lateral view.
3. Soft tissue calcification at the wrist suggesting CPPD, stable.
STAT Fax

## 2015-05-11 IMAGING — CR WRIST LT 3 VWS MIN
2 series · 4 of 4 positions shown · non-contrast
Comparison: Left hand and forearm from the same date.

HISTORY/INDICATIONS:   Fall from chair while looking for something in [place] 1 day ago. Patient complains of left upper extremity pain.
TECHNIQUE: Left wrist, 4 views.

[Series 1: oblique · 0.17mm/px · 3 of 3 slices shown]
[im 1/3]
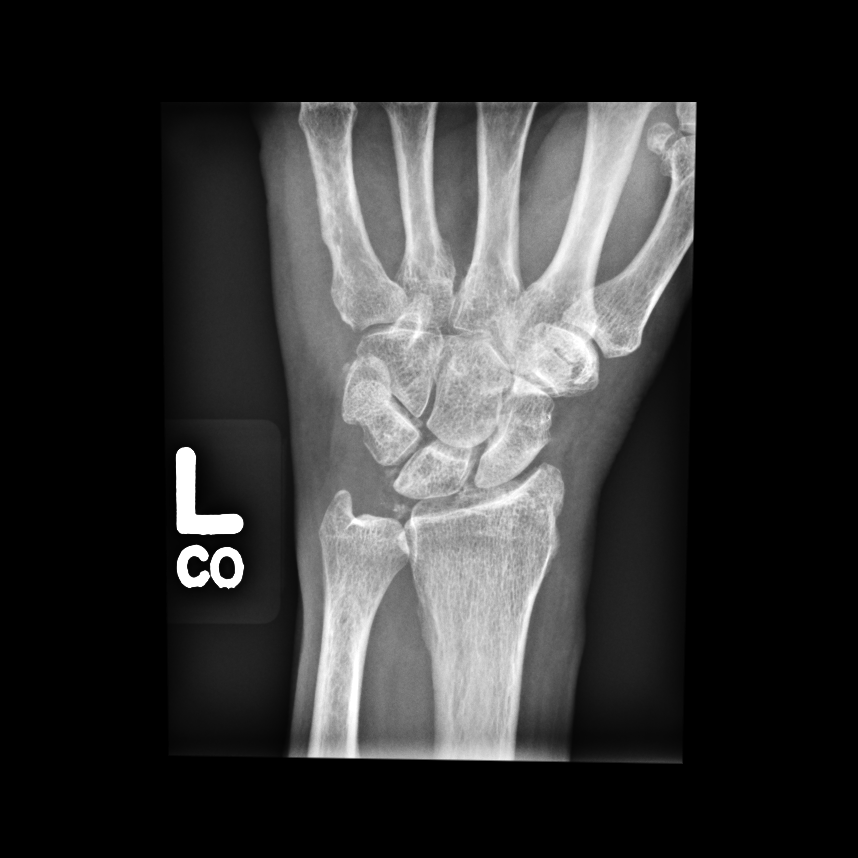
[im 2/3]
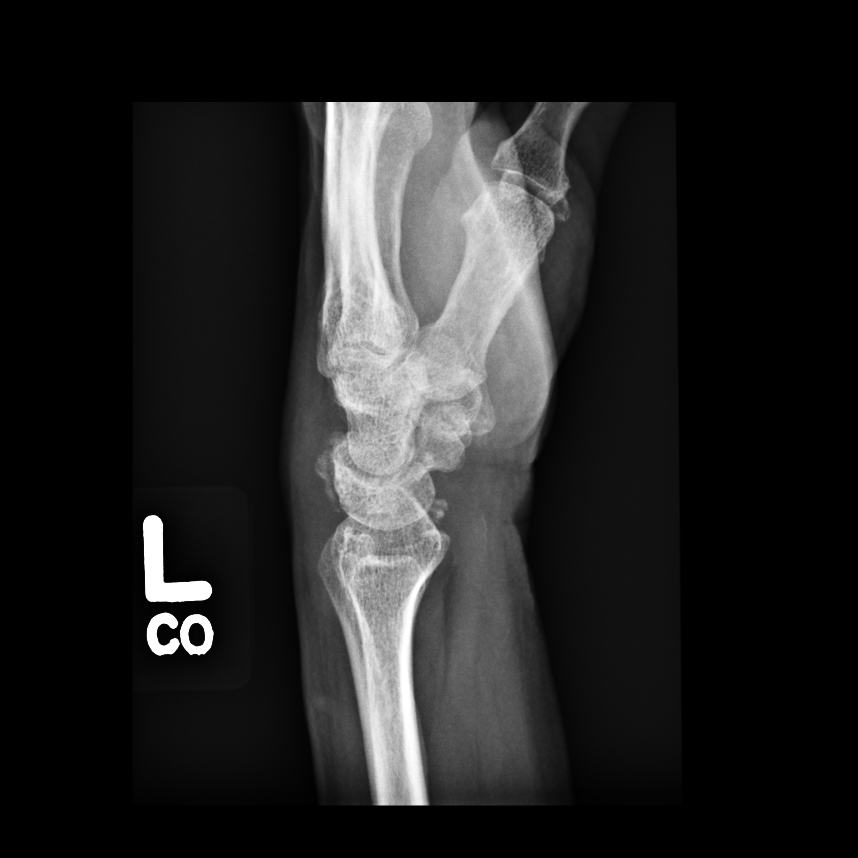
[im 3/3]
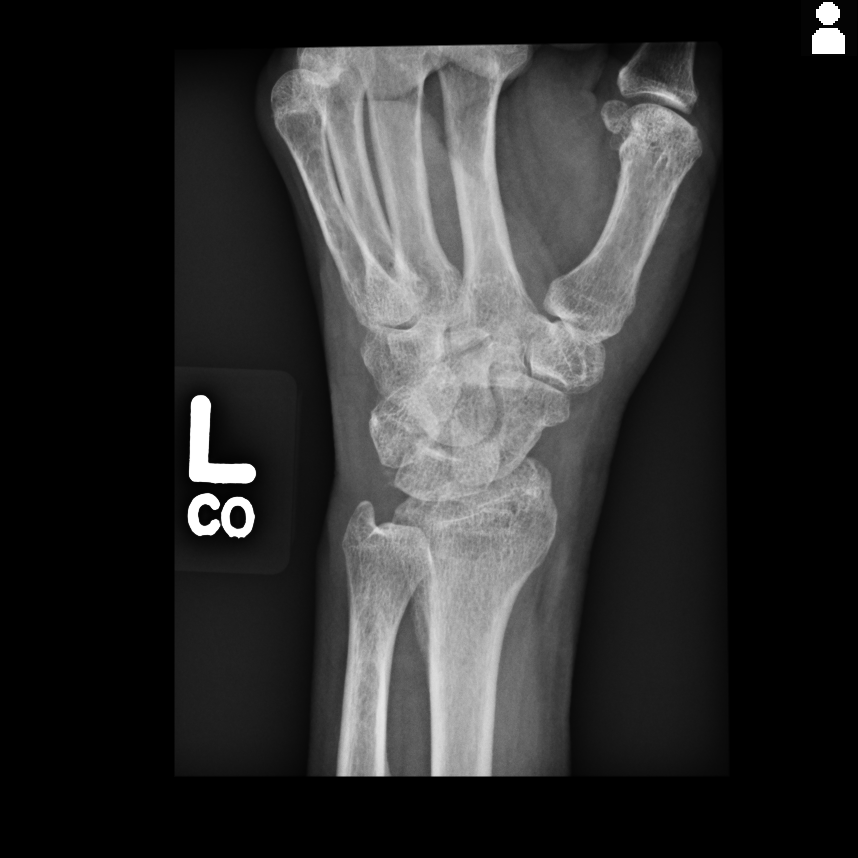

[ulnar flexion]
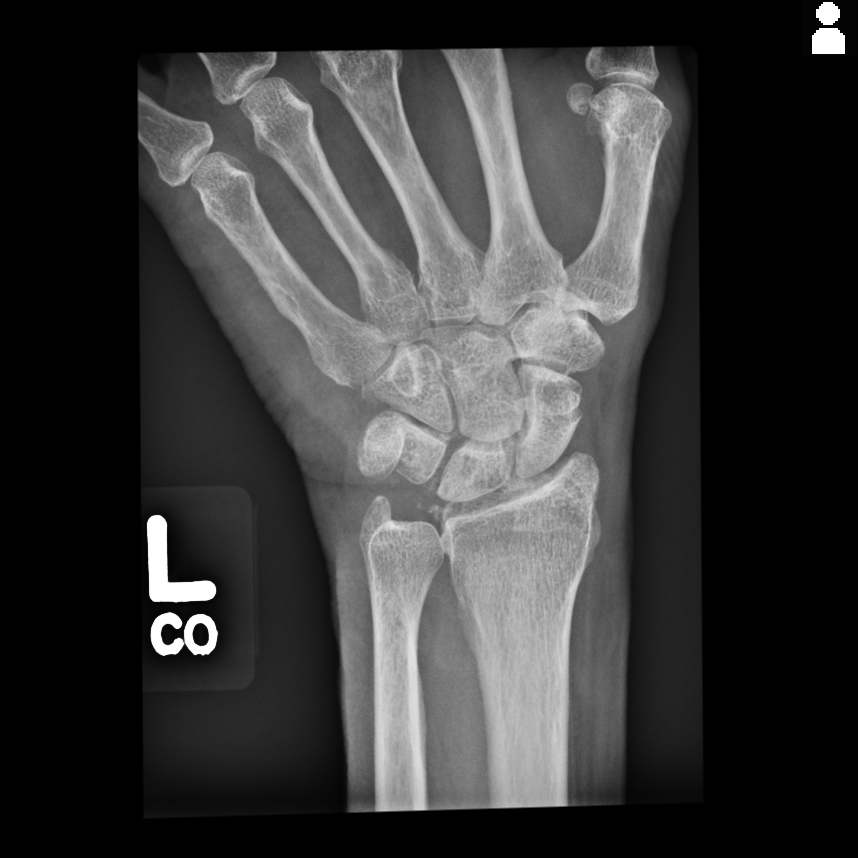

[4 of 4 positions shown; findings below may reference images not displayed]

FINDINGS: Abnormal calcifications seen within the soft tissues at the distal radioulnar joint and proximal carpal row suggesting underlying CPPD. Bone mineral density is preserved. No convincing acute fractures or dislocations are evident. In particular, there is no evidence of fracture, dislocation, or radiopaque foreign body.
IMPRESSION: 1. Soft tissue calcifications suggesting CPPD noted at the distal radioulnar joint and proximal ulnar row interspace.

2. No convincing fractures or dislocations.

3. Preservation of normal bone density.

STAT Fax

## 2015-05-11 IMAGING — CR HAND LT 3 VWS MIN
1 series · 3 of 3 positions shown · non-contrast
Comparison: Left wrist from same date.

HISTORY/INDICATIONS:   Fall from chair one day ago.
TECHNIQUE: Left hand, 3 views.

[Series 1: lat · 0.17mm/px · 3 of 3 slices shown]
[im 1/3]
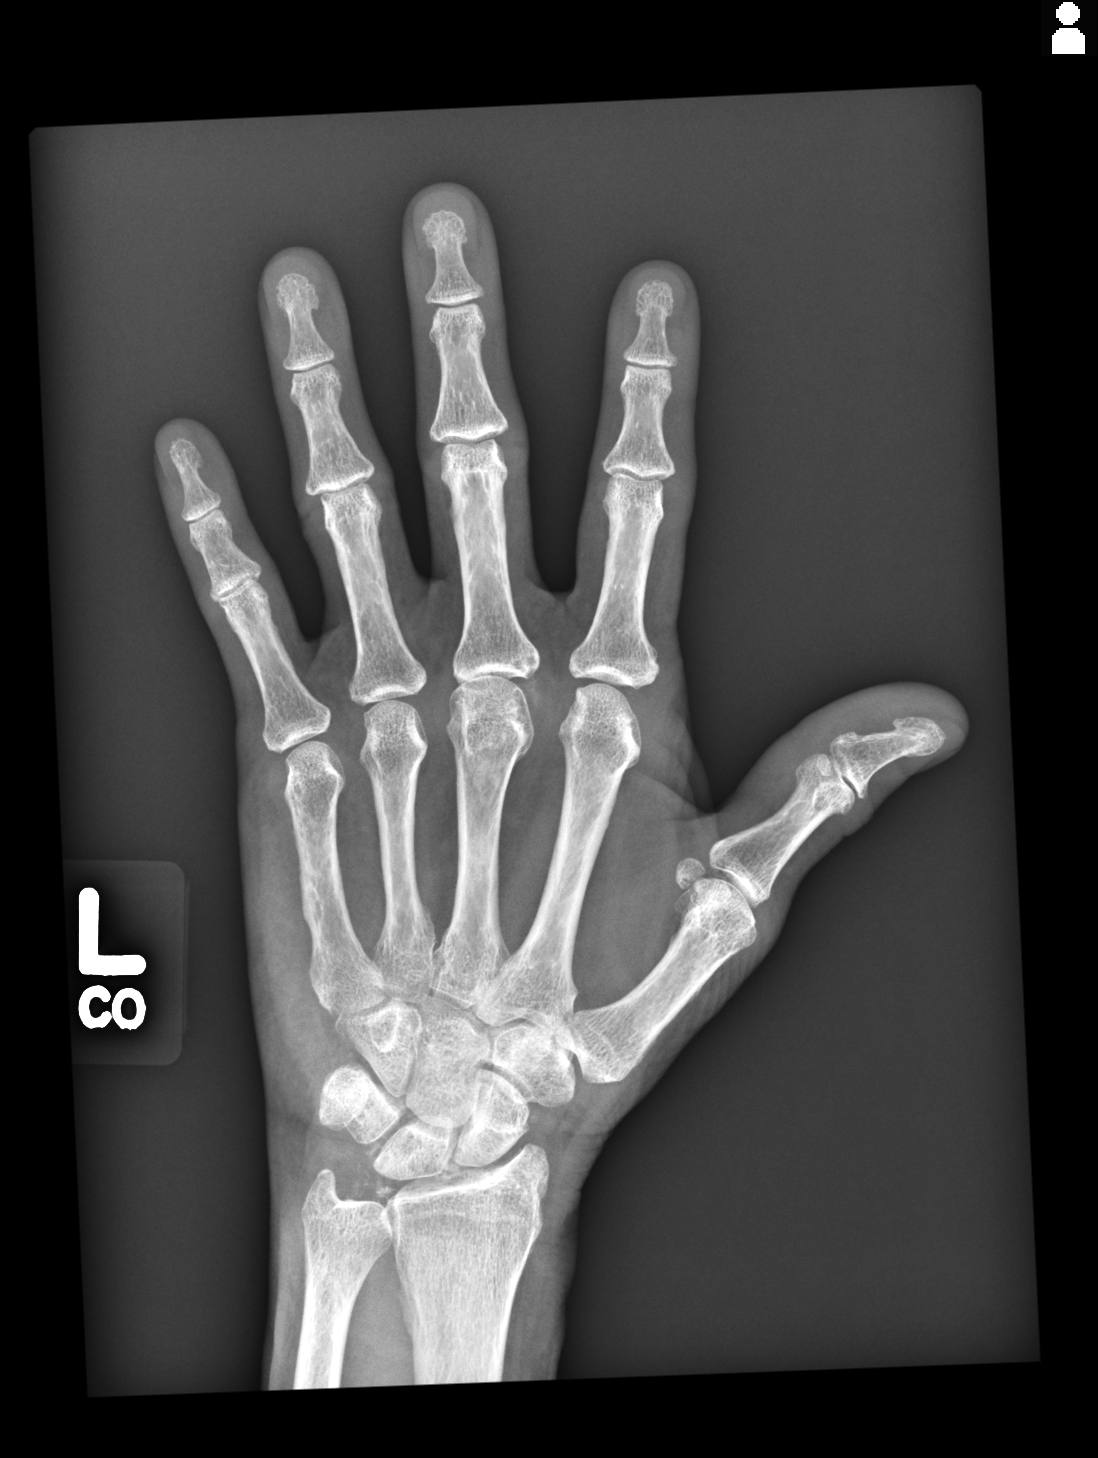
[im 2/3]
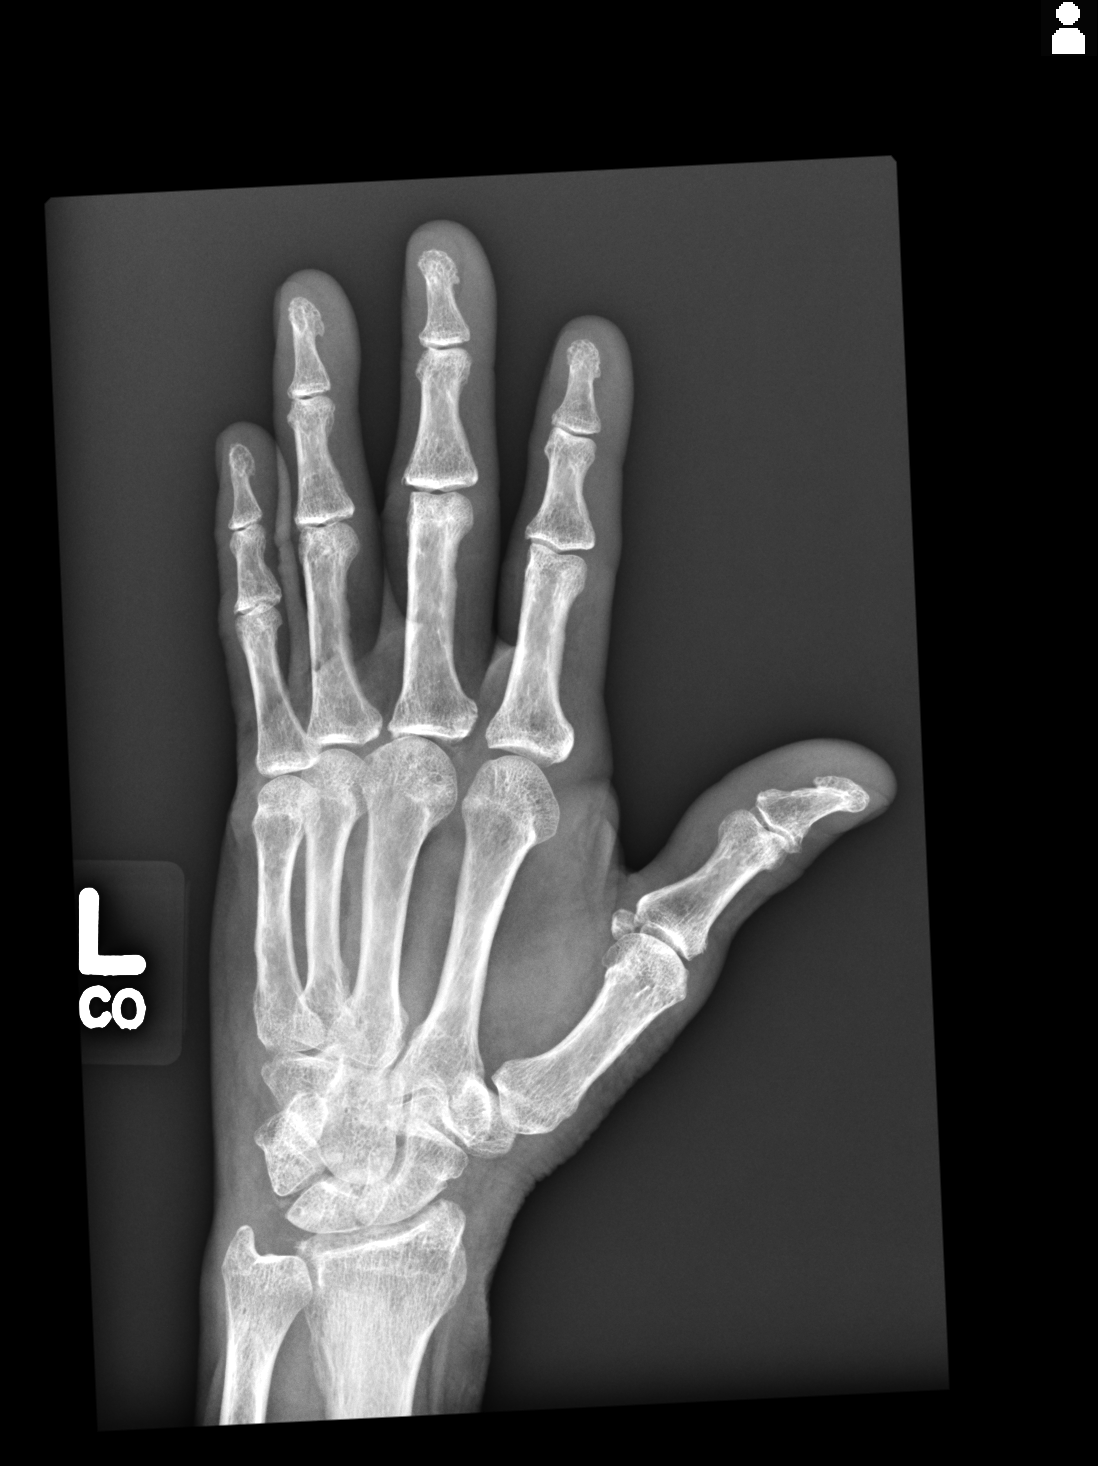
[im 3/3]
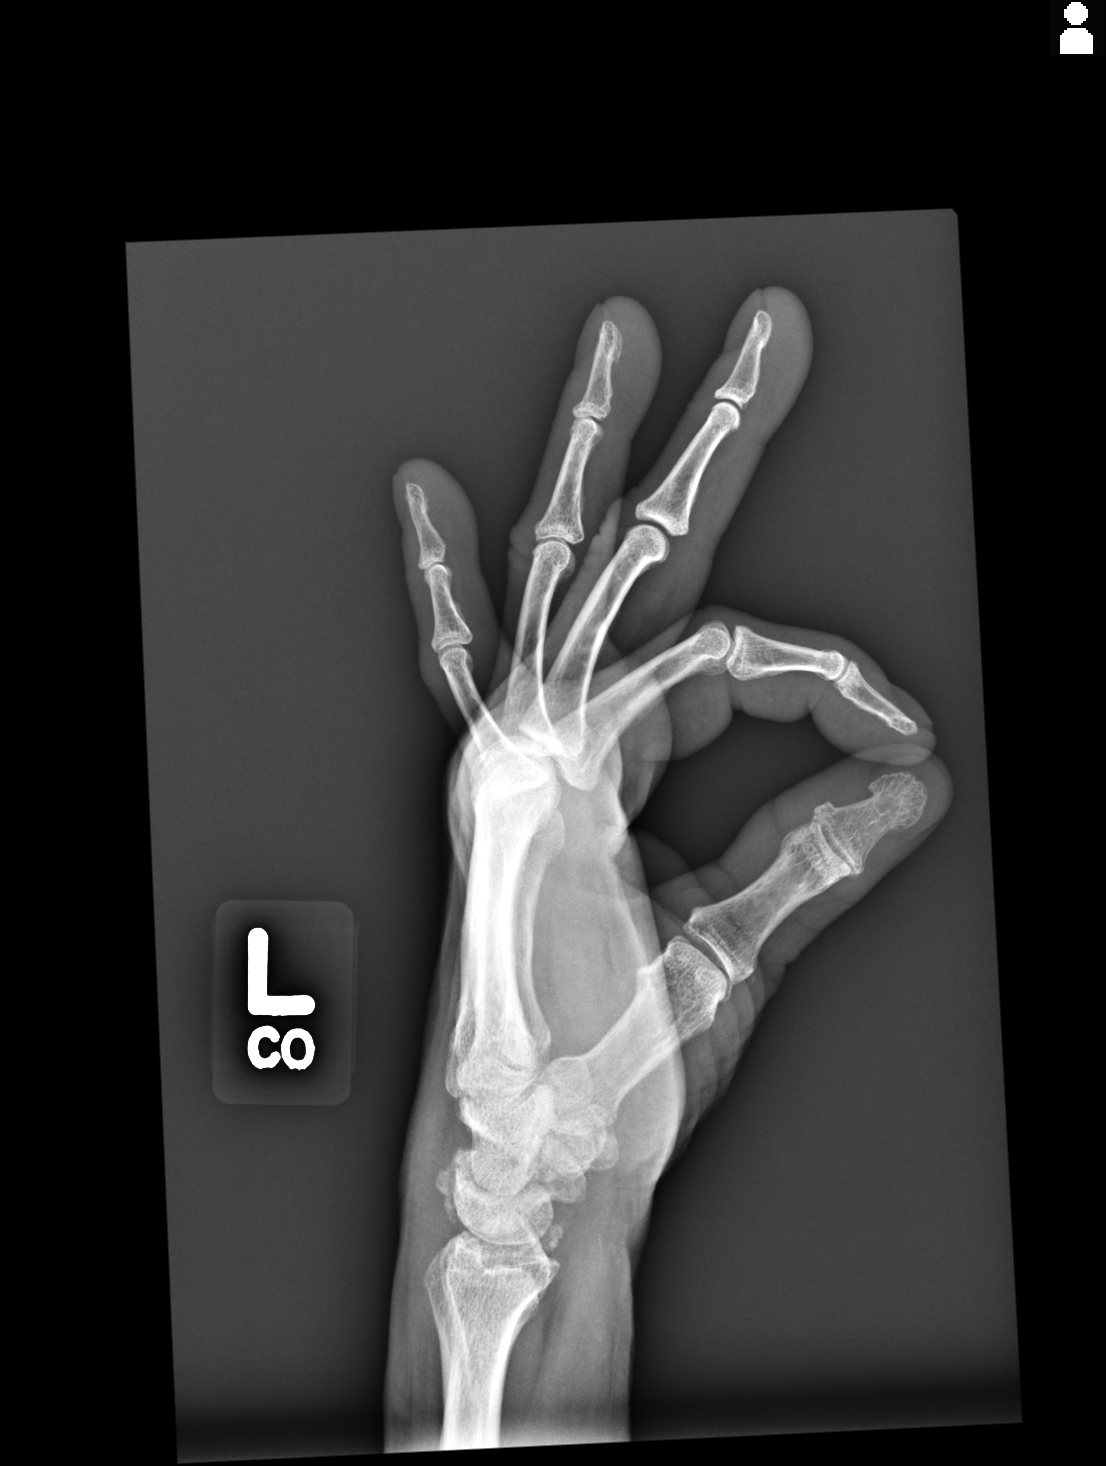

[3 of 3 positions shown; findings below may reference images not displayed]

FINDINGS: Bone mineral density is preserved. No fractures or dislocations are seen. Soft tissue calcification at the proximal carpal row and distal radioulnar joint suggest underlying CPPD. Osteoarthritis with joint space narrowing is seen at the third MCP joint and at the first carpal metacarpal joint. No convincing fractures or dislocations are seen. No radiopaque foreign bodies are evident.
IMPRESSION: 1. Soft tissue calcifications suggesting CPPD at the wrist.

2. Osteoarthritis is noted at the first carpal metacarpal joint and prominently at the third MCP joint.

3. Bone mineral density is preserved. No convincing fractures or dislocations are seen.

Stat fax

## 2015-05-11 IMAGING — CR SHOULDER LT 2-3 VWS
1 series · 3 of 3 positions shown · non-contrast
Comparison: Left forearm, hand and wrist radiographs from the same date.

Images Obtained from [facility]
HISTORY/INDICATIONS:   Fall from chair one day ago with left upper shoulder pain.
TECHNIQUE: Left shoulder  3 views

[Series 1: internal rotate · 0.17mm/px · 3 of 3 slices shown]
[im 1/3]
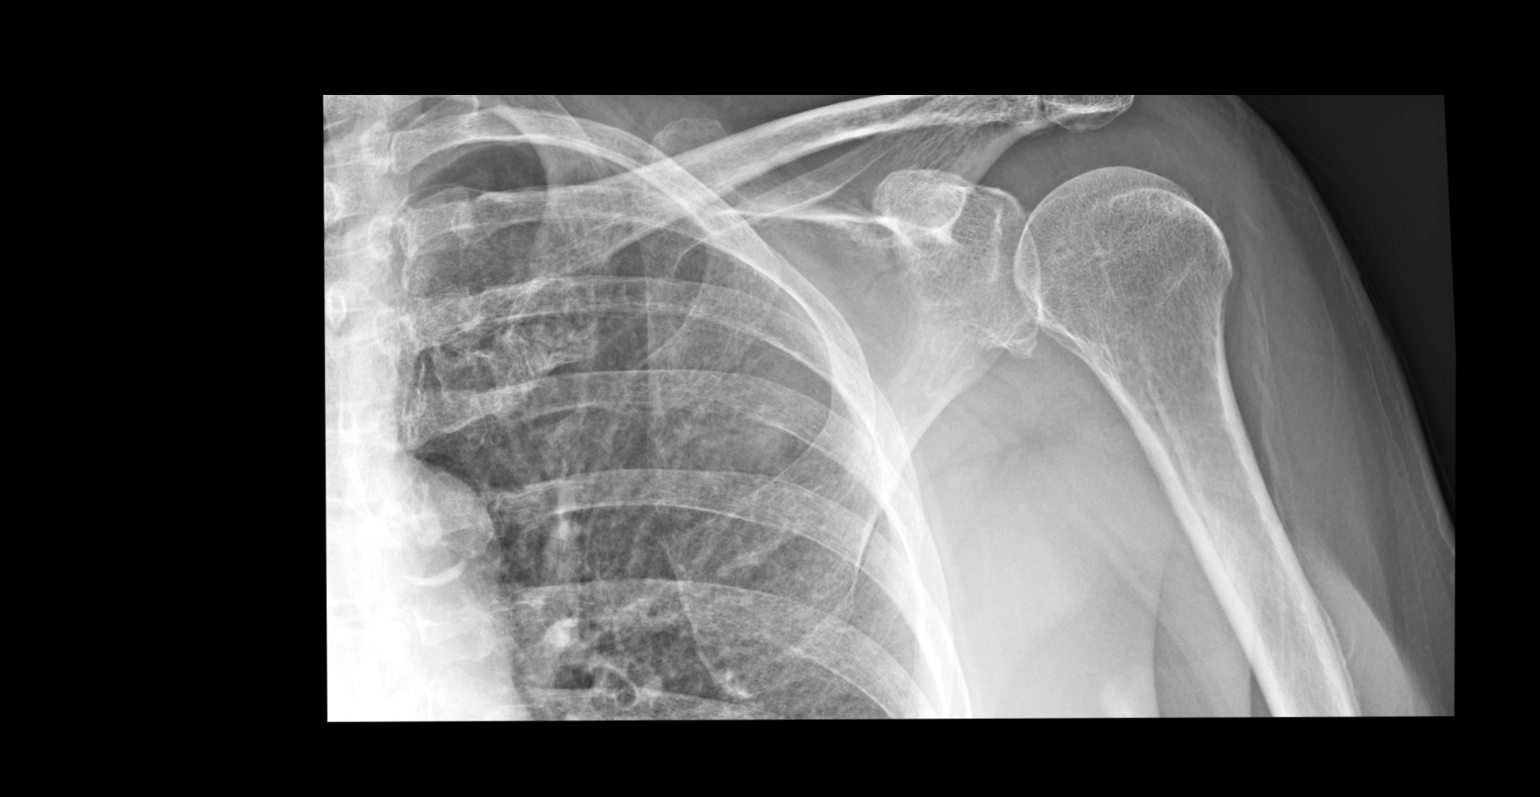
[im 2/3]
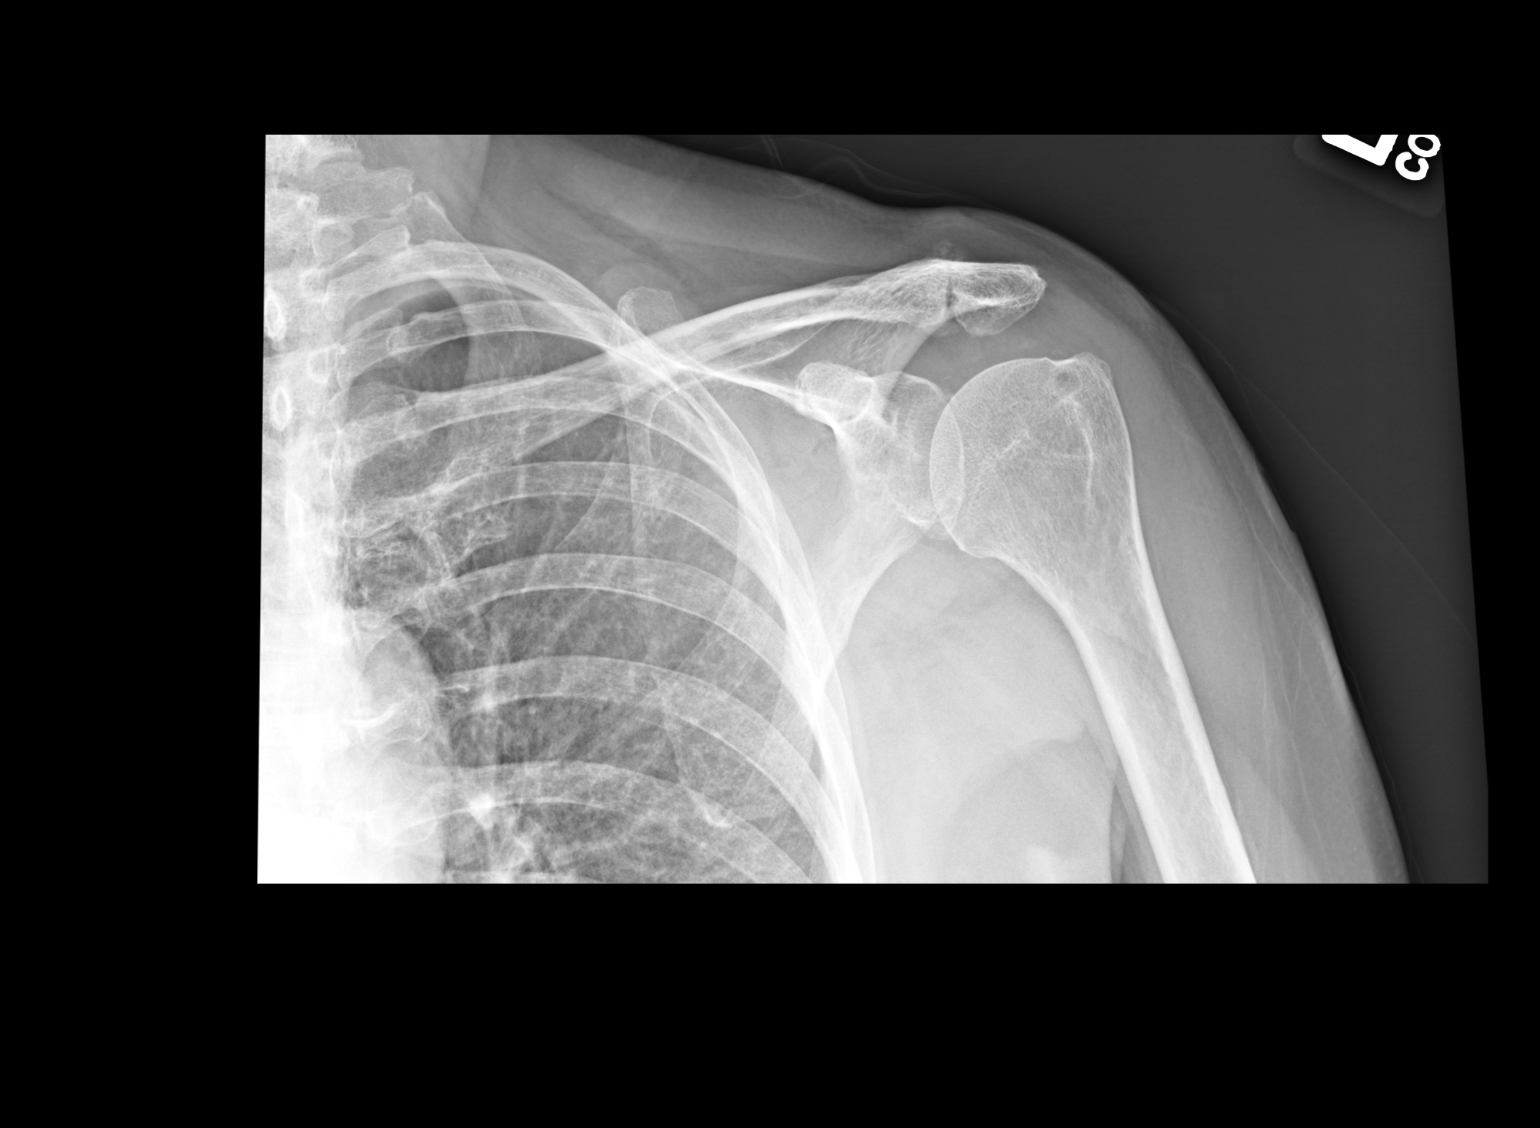
[im 3/3]
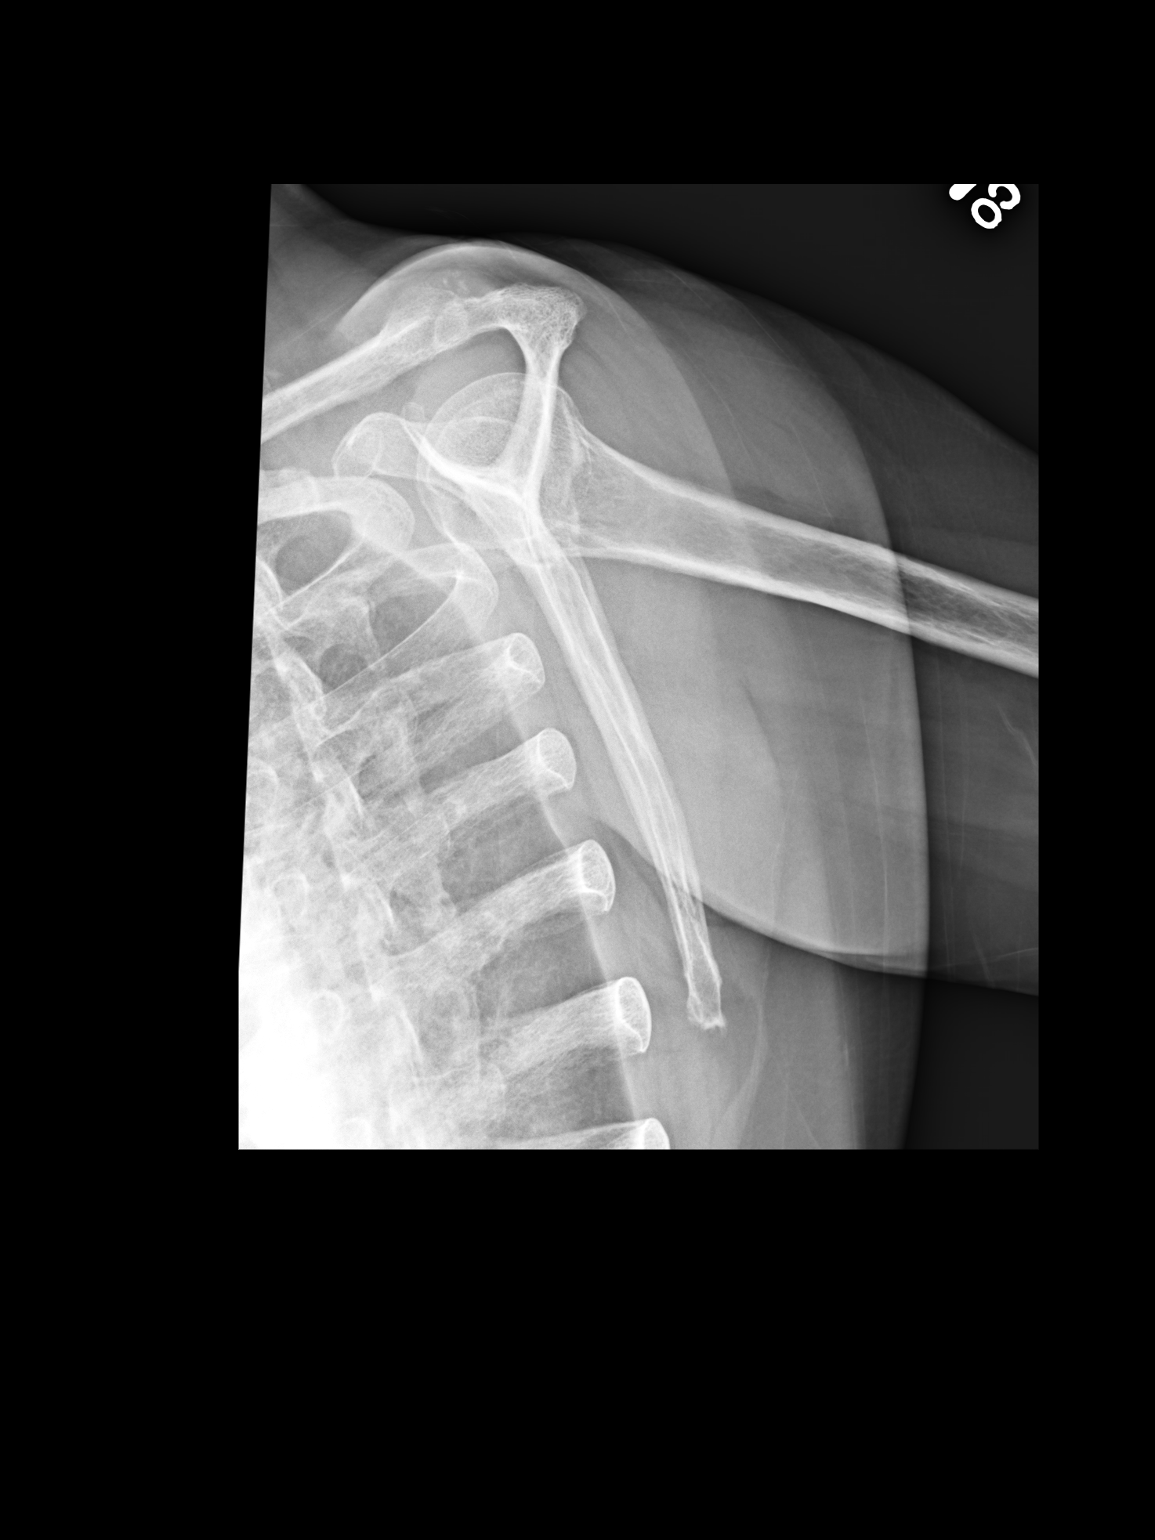

[3 of 3 positions shown; findings below may reference images not displayed]

FINDINGS: Osteoarthritis is seen prominently at the AC joint. There is minimal inferior spurring at the glenoid. Adequate acromiohumeral distance is noted. No fractures or dislocations are seen.
IMPRESSION: 1. No identified fractures or dislocations.
2. Severe osteoarthritis of the left AC joint and mild osteoarthritis of the inferior glenohumeral joint.
3. Remaining exam is unremarkable for age.
STAT Fax

## 2015-07-18 IMAGING — MG MAMMO SCRN BIL W/CAD TOMO
8 series · 8 of 24 positions shown · non-contrast
Comparison: none

Images Obtained from [facility]
CLINICAL RA REF: Mammo Scrn bilateral (Digital) W/Cad Routine with Tomosynthesis.
Digital images were generated from the 3D Tomosynthesis data acquired during the exam.
Comparison is made to exams dated:  07/18/2014, 07/14/2013, and 07/02/[facility]
The tissue of both breasts is predominantly fatty.
Current study was also evaluated with a Computer Aided Detection (CAD) system.
No significant masses, calcifications, or other findings are seen in either breast.
There has been no significant interval change.

[R MLO]
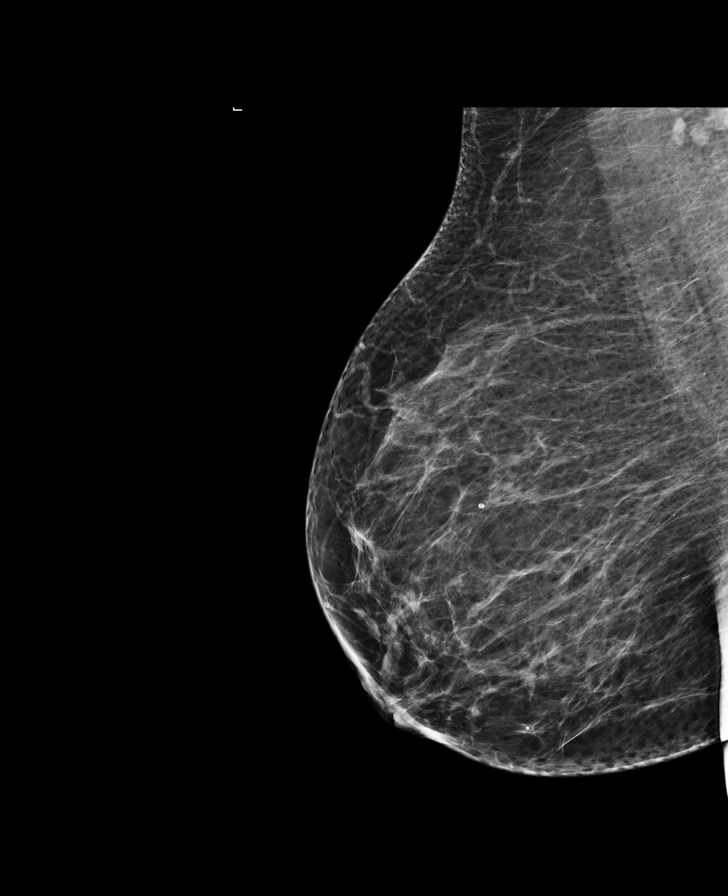

[L CC]
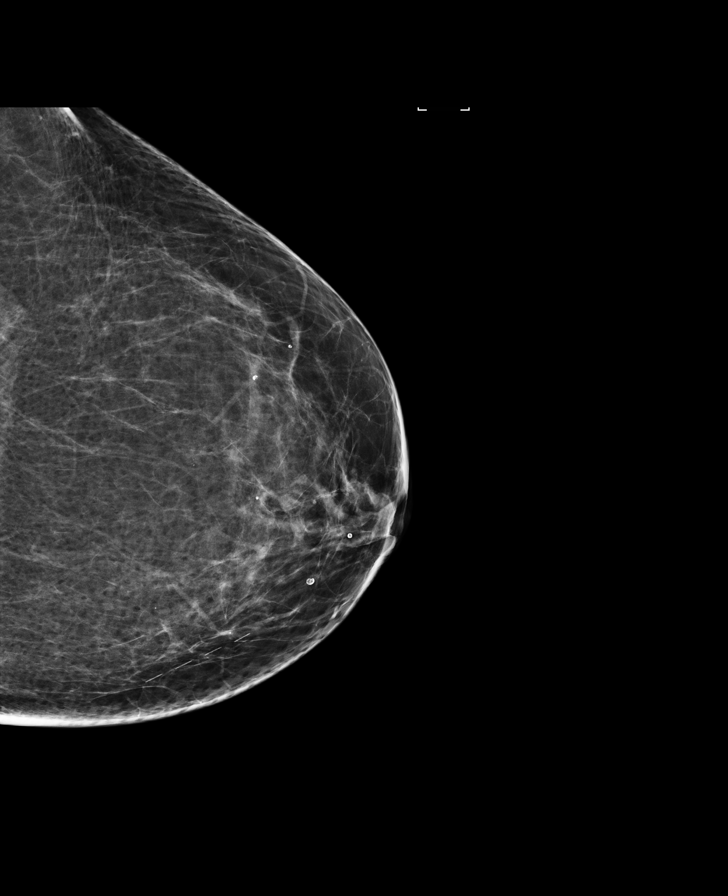

[R CC]
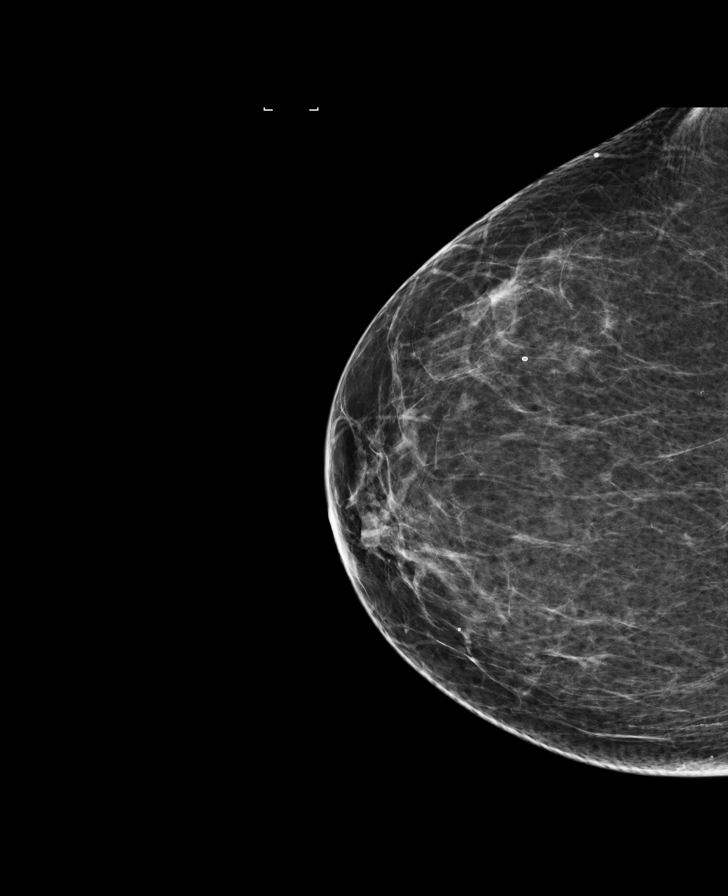

[L MLO]
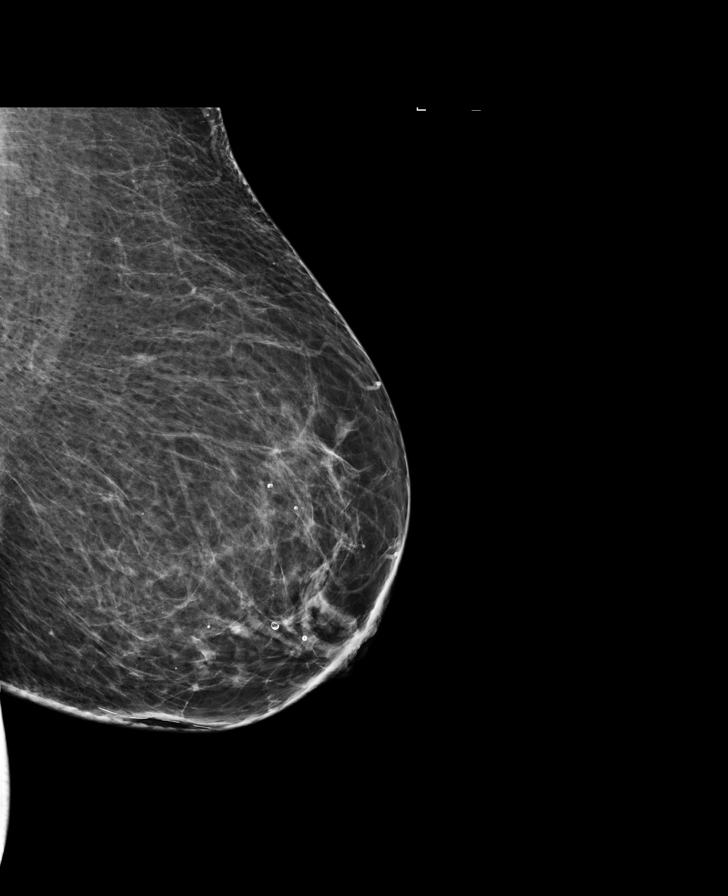

[R CC tomo · tomo slice 35/68.0]
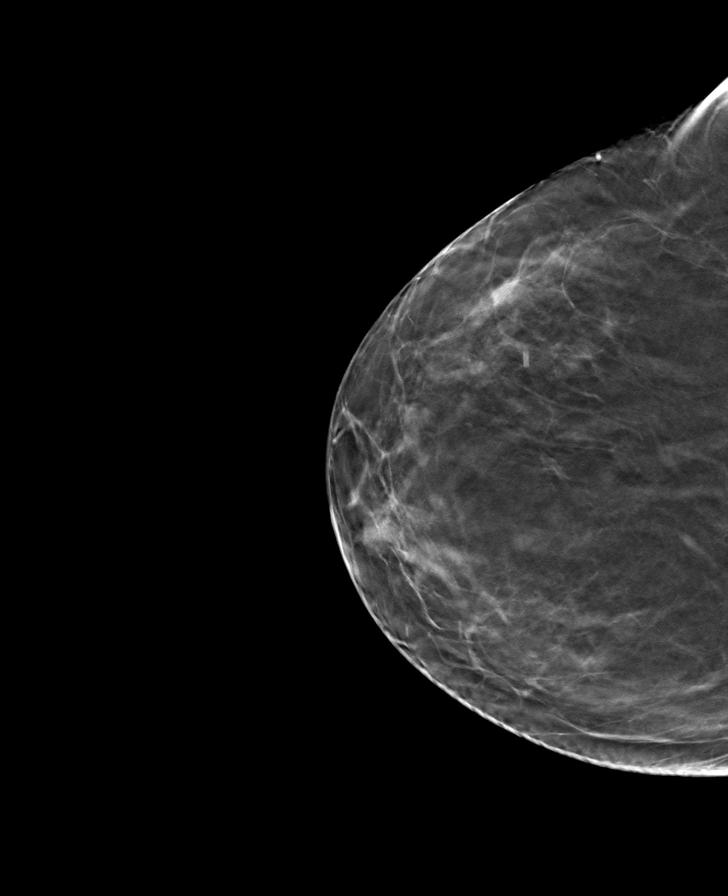

[L MLO tomo · tomo slice 37/74.0]
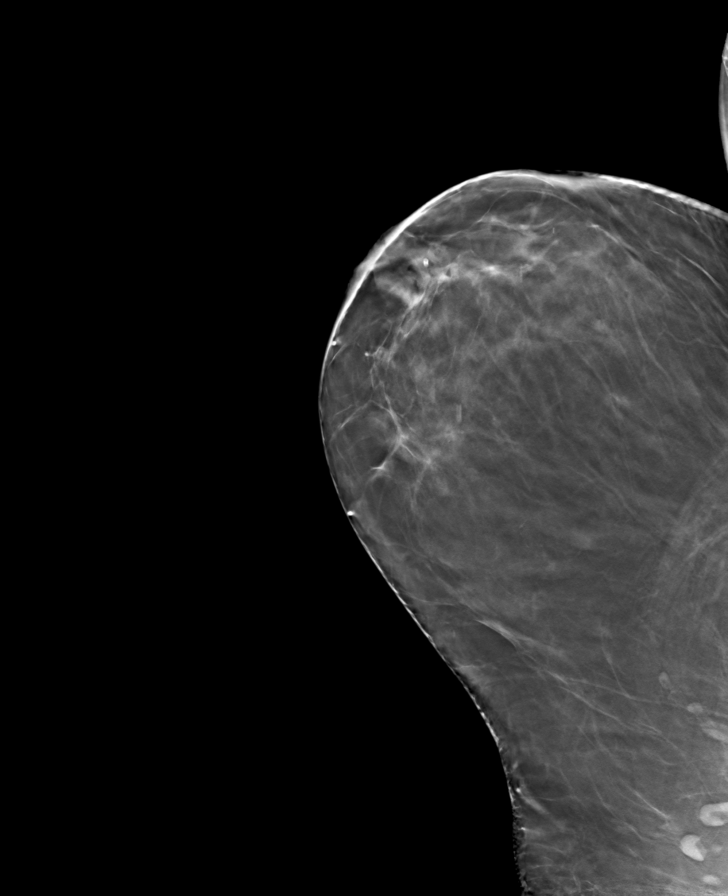

[L CC tomo · tomo slice 37/73.0]
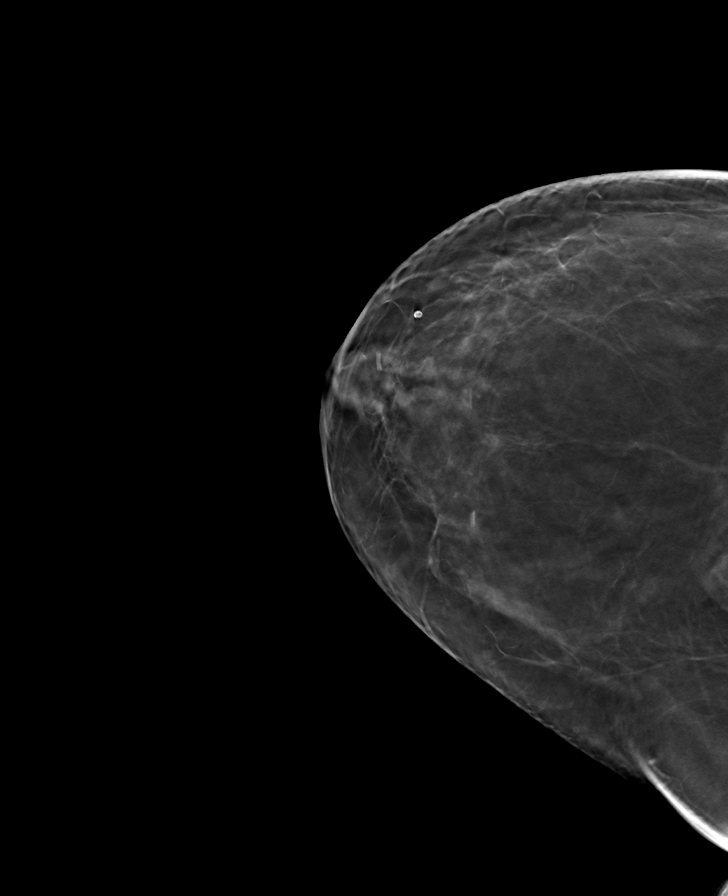

[R MLO tomo · tomo slice 38/75.0]
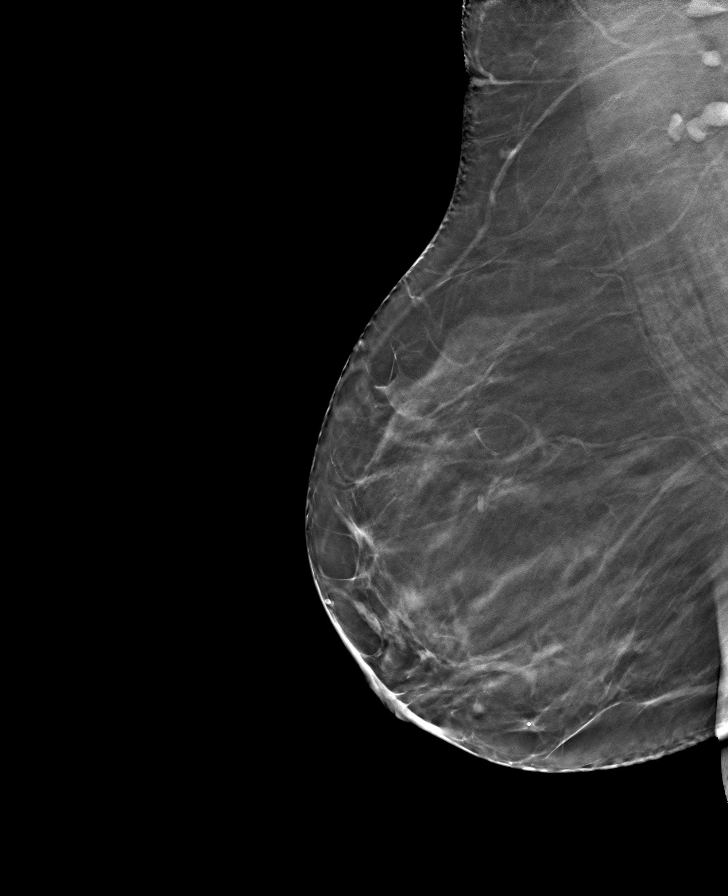

[8 of 24 positions shown; findings below may reference images not displayed]

IMPRESSION: There is no mammographic evidence of malignancy. A 1 year screening mammogram is recommended.
Virginijaa/Geir Arne:07/20/2015 [DATE]
letter sent: Normal
Mammogram BI-RADS: 1 Negative   [id number]

## 2016-03-19 IMAGING — CR CHEST 2 VWS PA LAT
1 series · 2 of 2 positions shown · non-contrast
Comparison: None

HISTORY/INDICATIONS: Smoker
TECHNIQUE: Chest 2 views.

[Series 1: pa · 0.17mm/px · 2 of 2 slices shown]
[im 1/2]
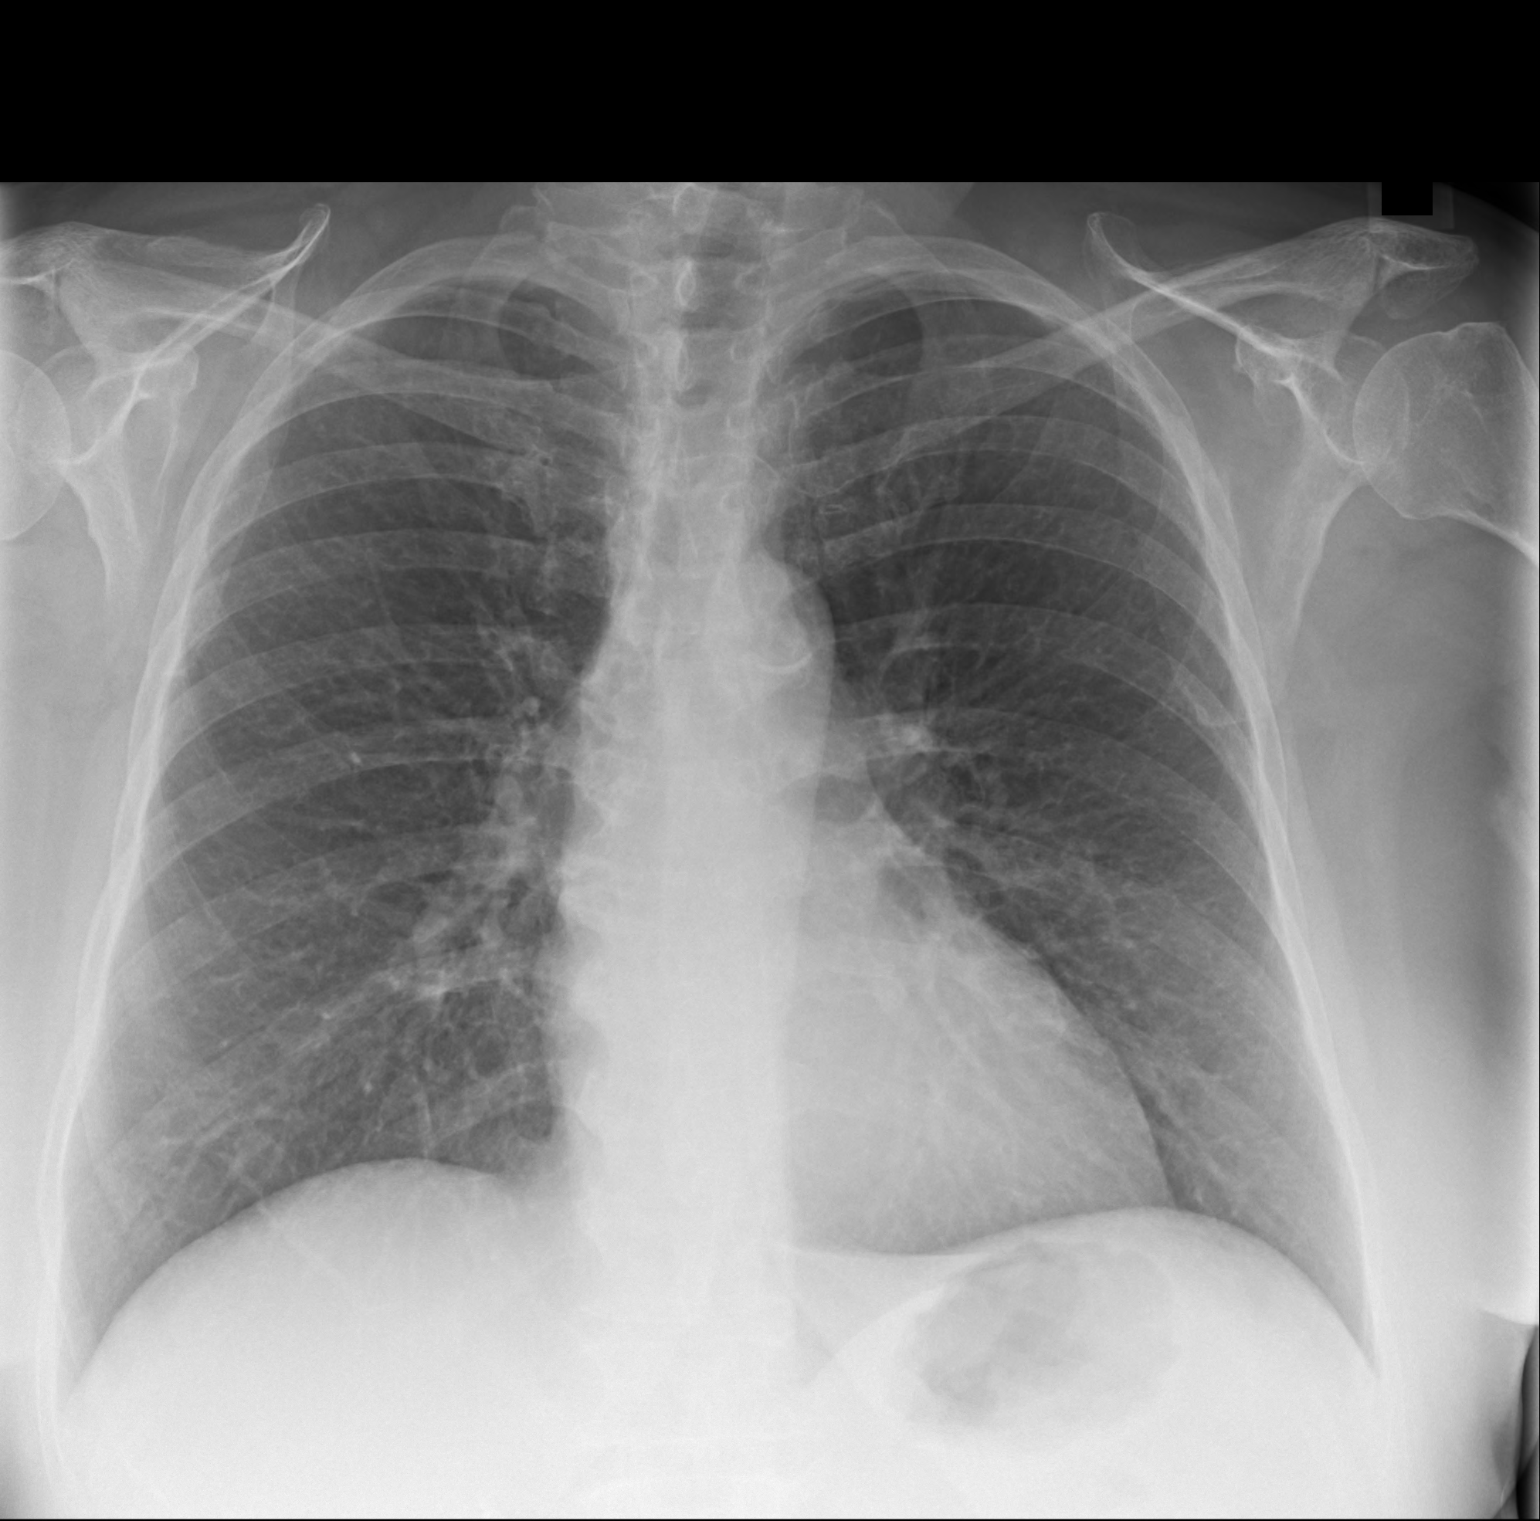
[im 2/2]
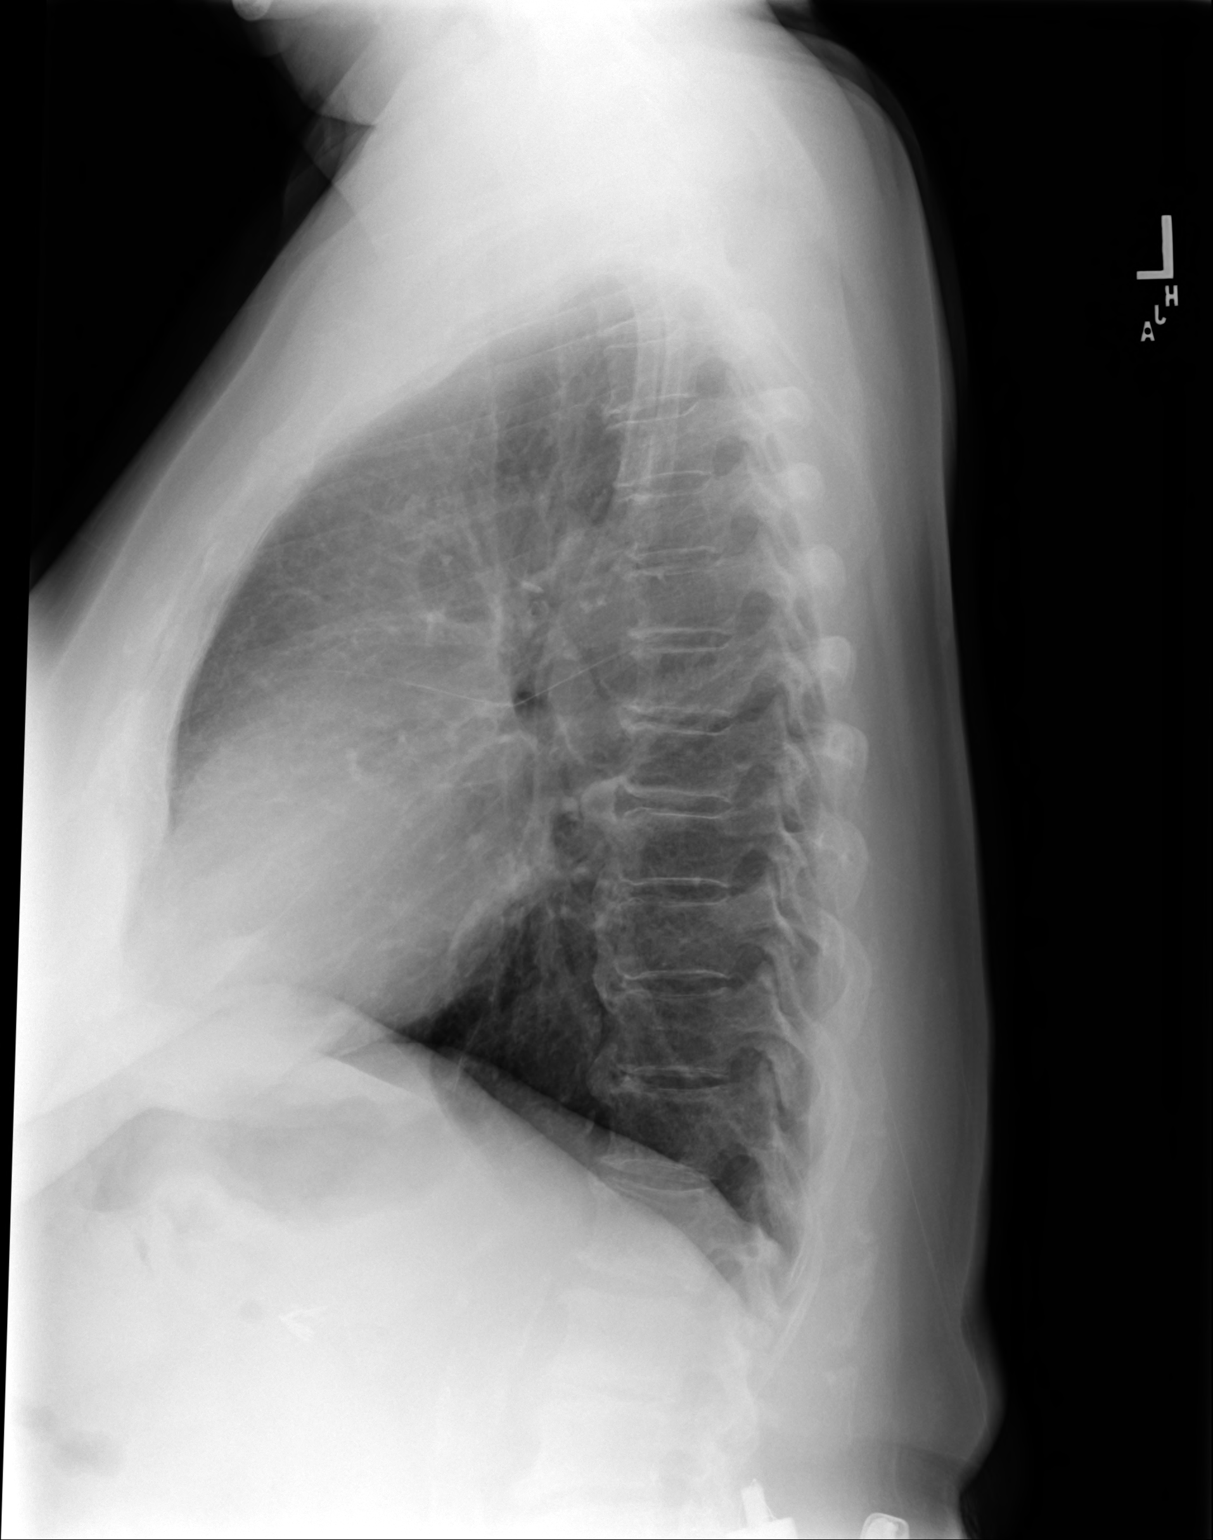

[2 of 2 positions shown; findings below may reference images not displayed]

FINDINGS: Cardiac size and pulmonary vascularity are normal. Vascular calcifications of the aorta. The mediastinal silhouette is unremarkable.  Minimal air trapping. The lungs are otherwise normal, and there is no pleural fluid. Degenerative changes about the spine.
IMPRESSION: 1. Minimal air trapping suggesting mild COPD.

2. Atherosclerotic vascular disease.

3. No focal infiltrate, nodule or mass.

## 2016-07-30 IMAGING — MG MAMMO SCRN BIL W/CAD TOMO
8 series · 8 of 24 positions shown · non-contrast
Comparison: none

[L CC]
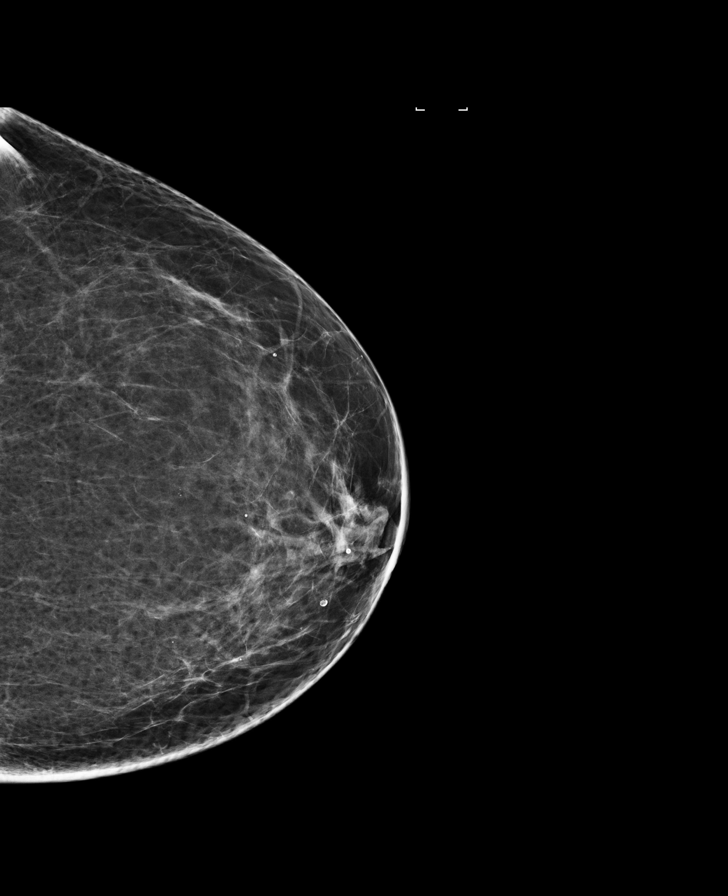

[L MLO]
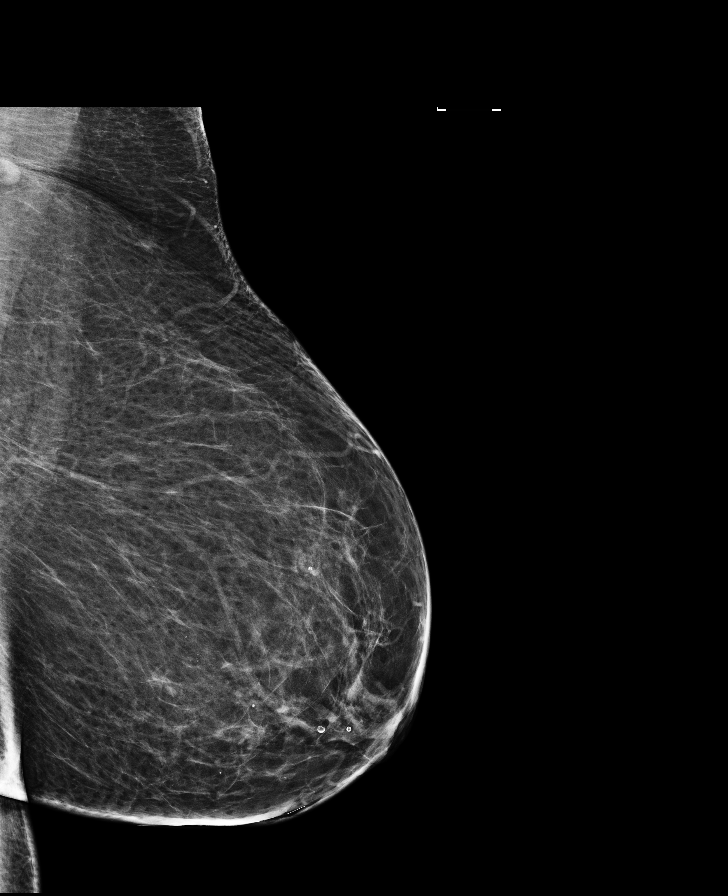

[R MLO]
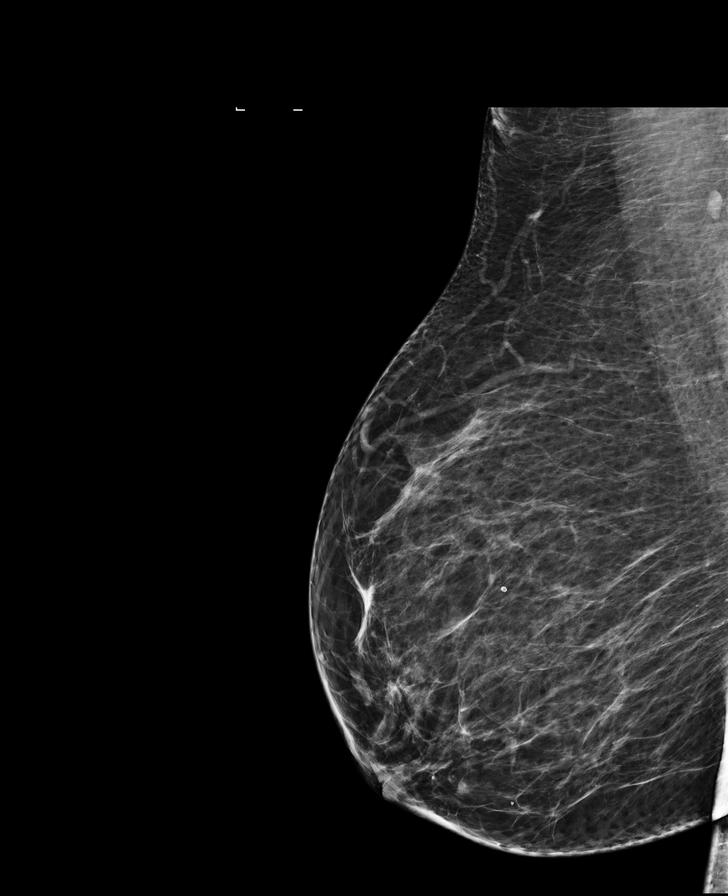

[R CC]
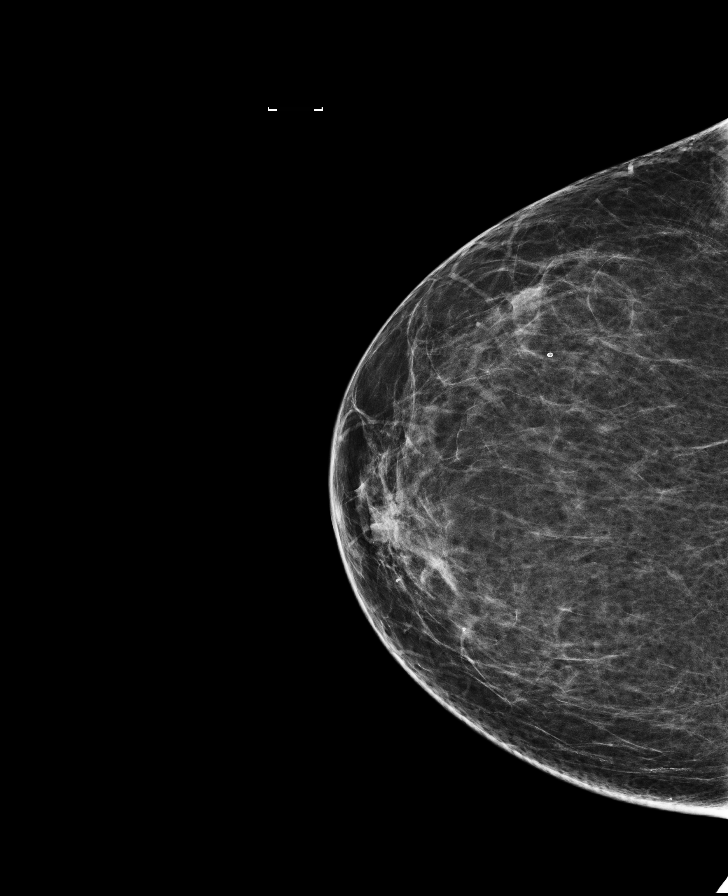

[L MLO tomo · tomo slice 41/81.0]
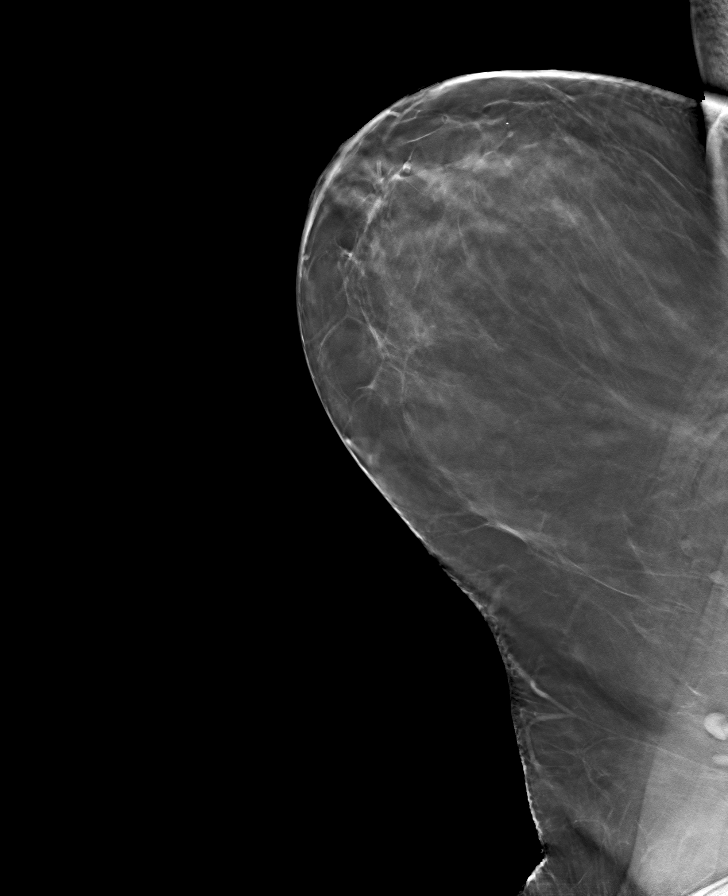

[R CC tomo · tomo slice 35/70.0]
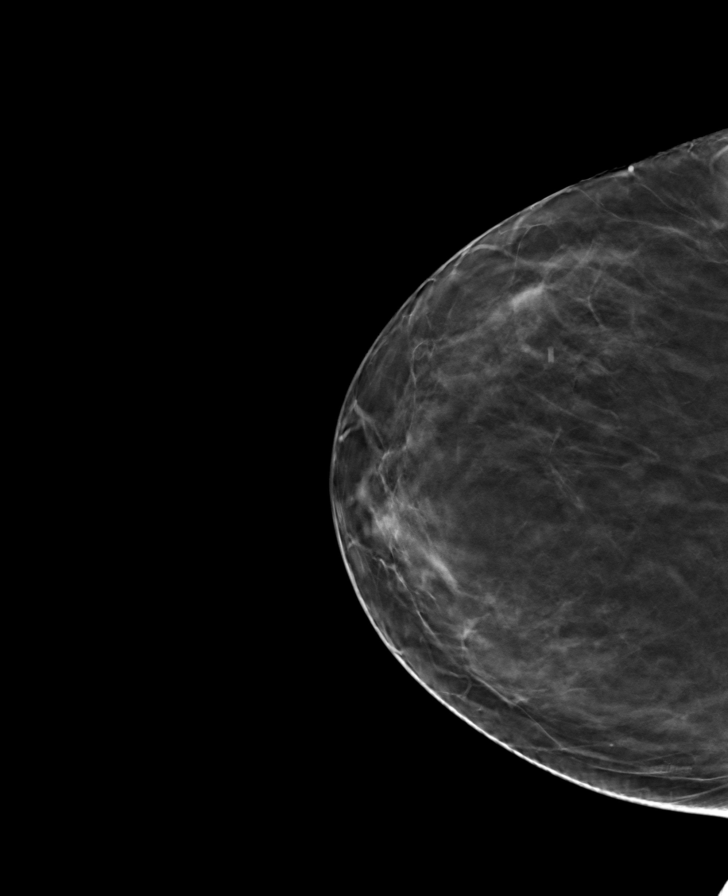

[L CC tomo · tomo slice 35/69.0]
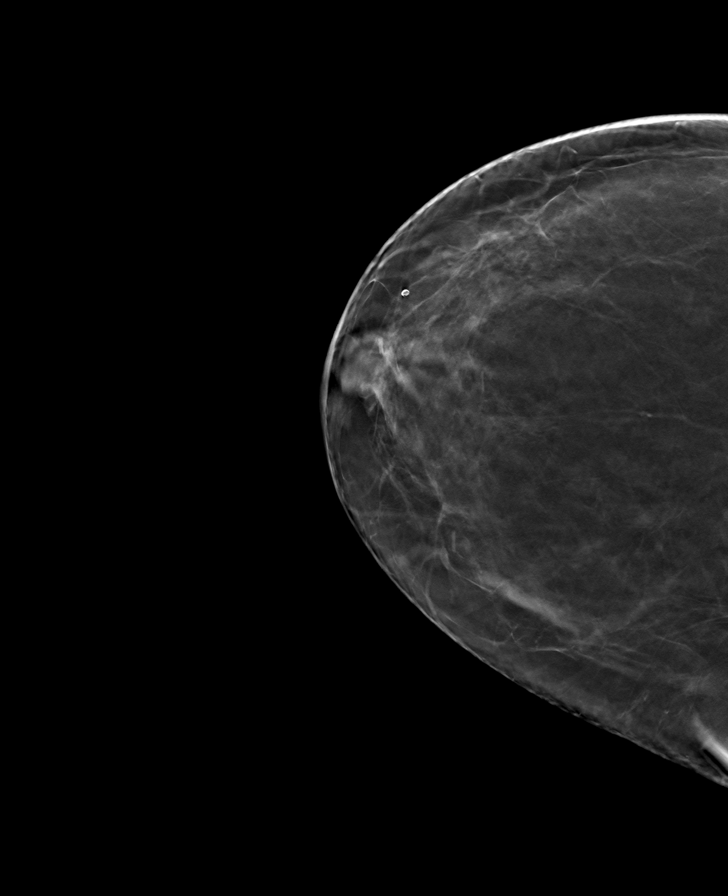

[R MLO tomo · tomo slice 39/76.0]
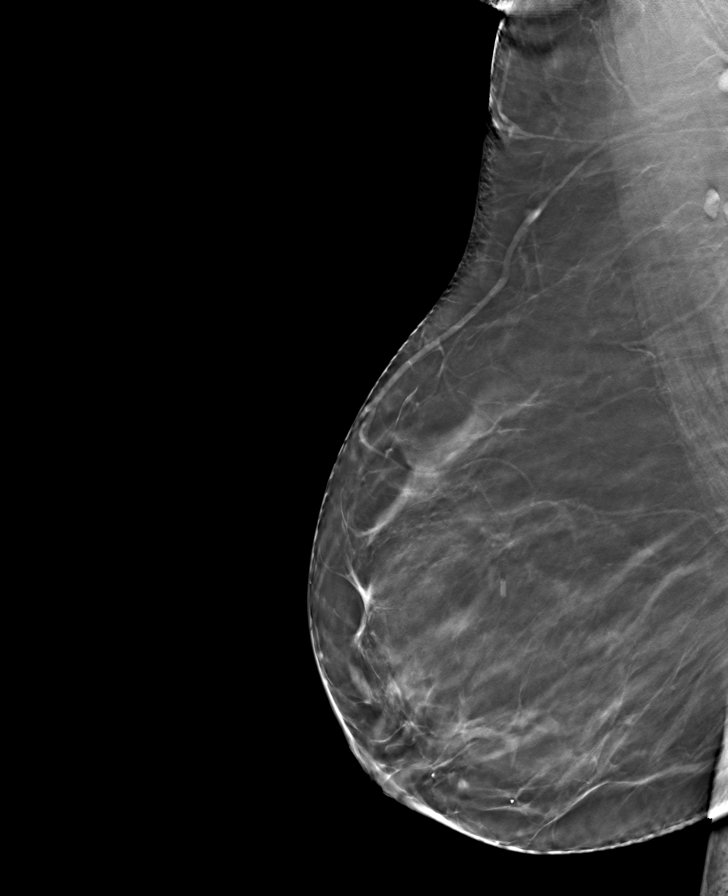

[8 of 24 positions shown; findings below may reference images not displayed]

Images Obtained from [facility]
CLINICAL RA REF: Mammo Scrn bilateral (Digital) W/Cad Routine with Tomosynthesis. History of a biopsy left breast.
Digital images were generated from the 3D Tomosynthesis data acquired during the exam.
Comparison is made to exams dated:  07/18/2015 [facility] - [HOSPITAL], 07/18/2014, and 07/14/2013 [facility] [[facility]].
The breasts are almost entirely fatty.
Current study was also evaluated with a Computer Aided Detection (CAD) system.
No significant masses, calcifications, or other findings are seen in either breast.
There has been no significant interval change.
NEGATIVE
There is no mammographic evidence of malignancy. A 1 year screening mammogram is recommended.
mc/penrad:07/30/2016 [DATE]
letter sent: Normal
FINAL ASSESSMENT: BI-RADS:Category 1: Negative   OUDUD

## 2017-09-01 IMAGING — MG MAMMO SCRN BIL W/CAD TOMO
8 series · 8 of 24 positions shown · non-contrast
Comparison: Comparison is made to exams dated:  07/30/2016, 07/18/2015 [facility] [[facility]], 07/18/2014, 07/14/2013, and 07/02/2012 [facility] [[facility]].

Images Obtained from [facility]
INDICATION: Screening mammogram. History of a biopsy left breast.
TECHNIQUE: Bilateral 2 dimensional digital mammogram was performed, followed by bilateral 3 dimensional mammogram. Current study was also evaluated with a Computer Aided Detection (CAD) system.
BREAST COMPOSITION: The breasts are composed of scattered areas of fibroglandular density.

[R MLO]
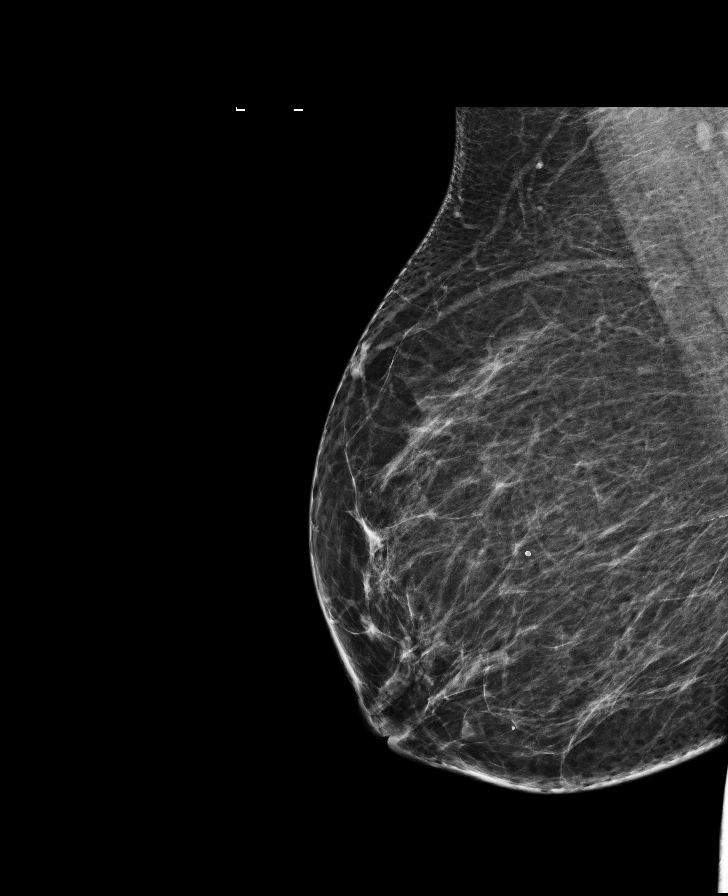

[R CC]
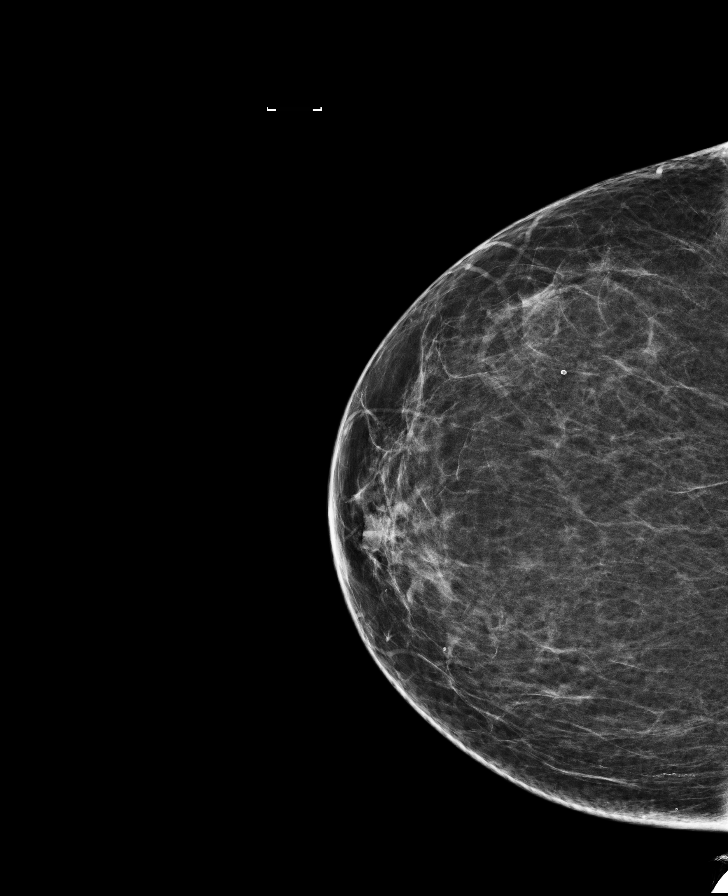

[L CC]
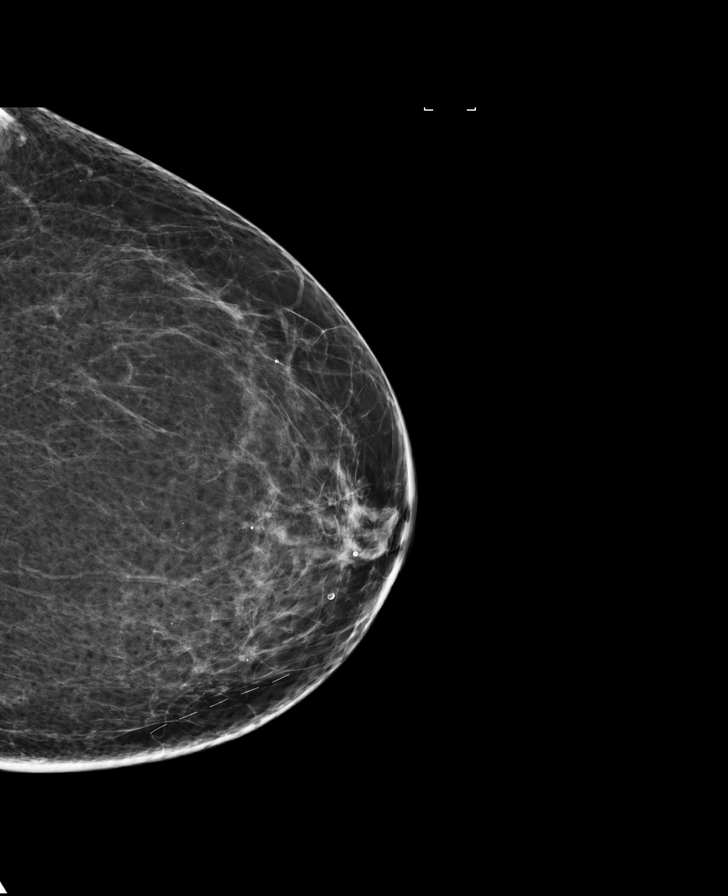

[L MLO]
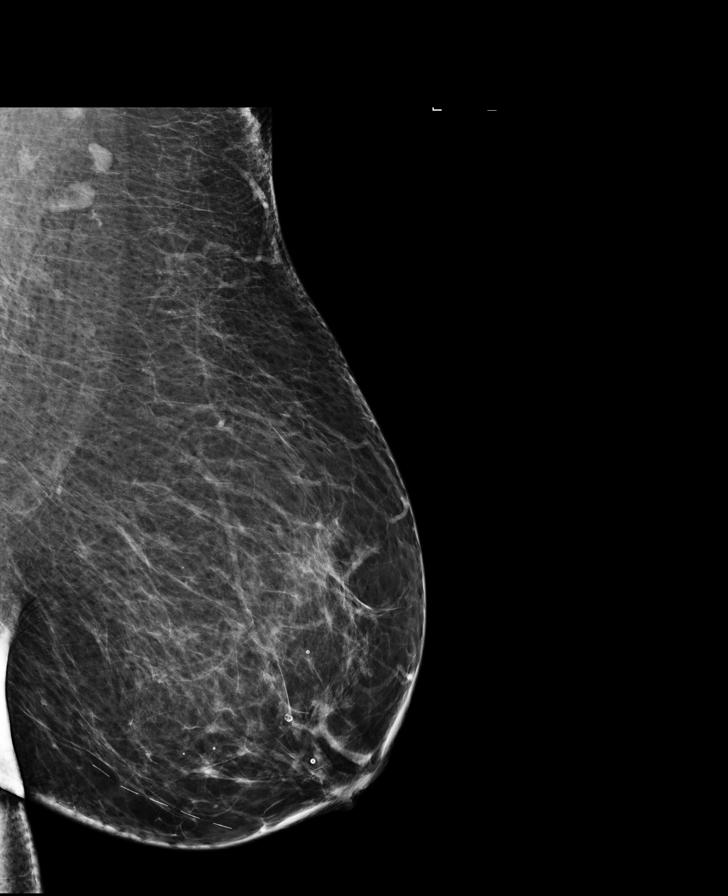

[L MLO tomo · tomo slice 45/90.0]
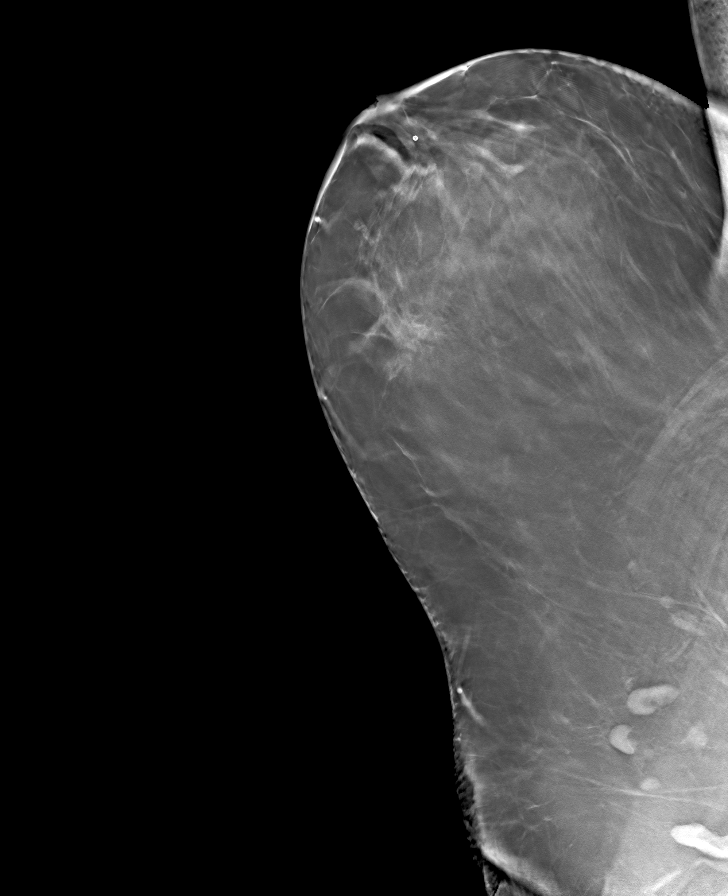

[R MLO tomo · tomo slice 41/81.0]
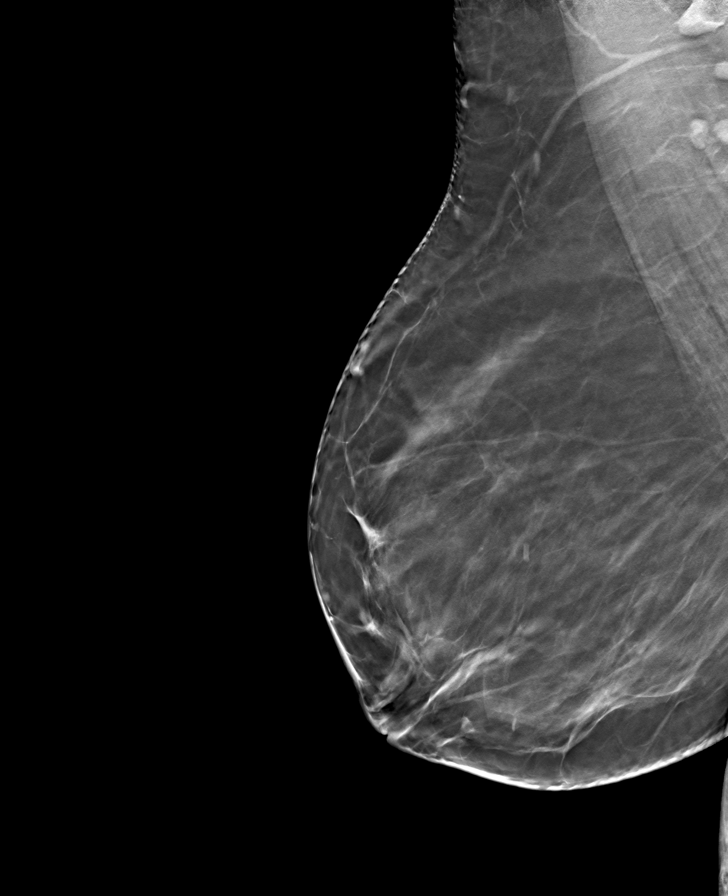

[L CC tomo · tomo slice 37/72.0]
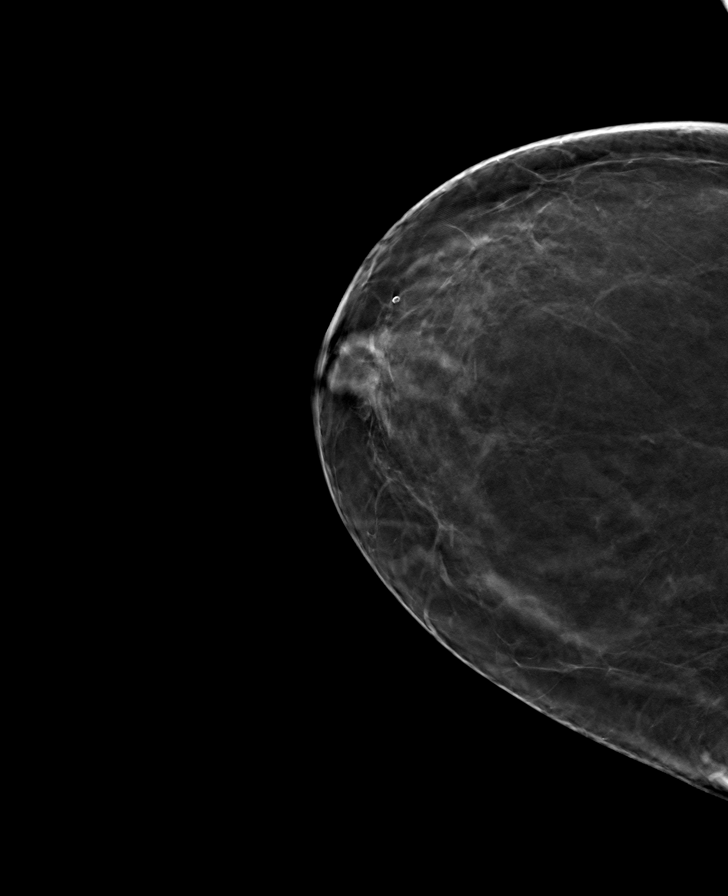

[R CC tomo · tomo slice 37/73.0]
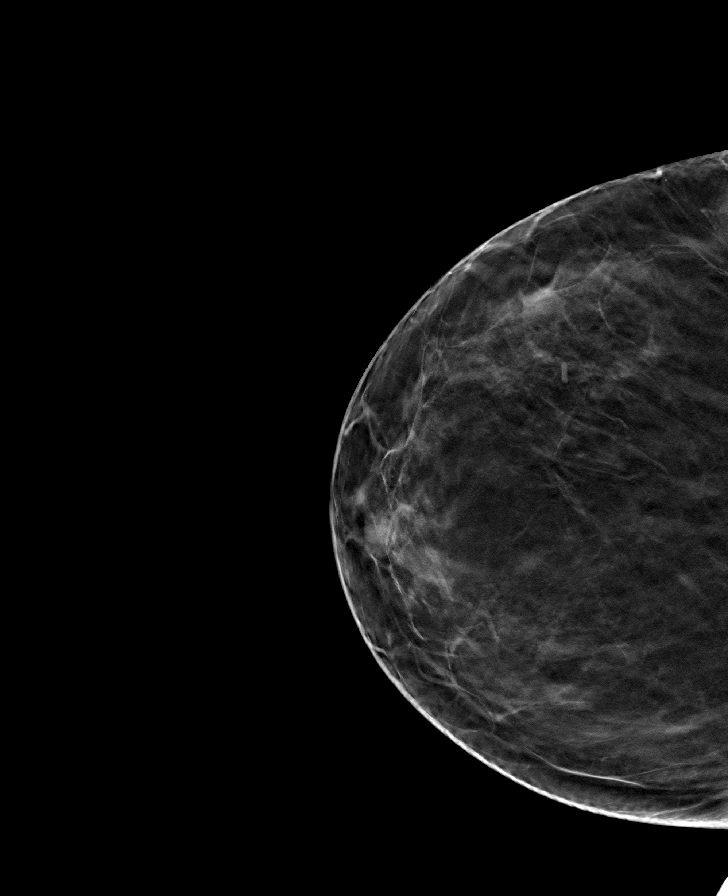

[8 of 24 positions shown; findings below may reference images not displayed]

FINDINGS: No significant masses, calcifications, or other findings are seen in either breast.
There has been no significant interval change.
IMPRESSION: There is no mammographic evidence of malignancy. A 1 year screening mammogram is recommended.
Dibos/Rinnah:09/01/2017 [DATE]
letter sent: Normal
FINAL ASSESSMENT: BI-RADS:Category 1: Negative

## 2018-02-08 IMAGING — OT DXA BONE DENSITY
3 series · 3 of 3 positions shown · non-contrast
Comparison: none

REASON FOR EXAM: Screening for osteoporosis

RISK FACTORS:  Diabetes increases risk of secondary osteoporosis.  
PRIOR EXAMS:  None
METHOD:  Scans of the spine, hip, and forearm were performed using dual energy X-ray densitometry (DXA) with the [employer] Discovery C ([id number]) system.

[Series 5: — · left · 1 of 1 slices shown (1 of 3)]
[im 1/1]
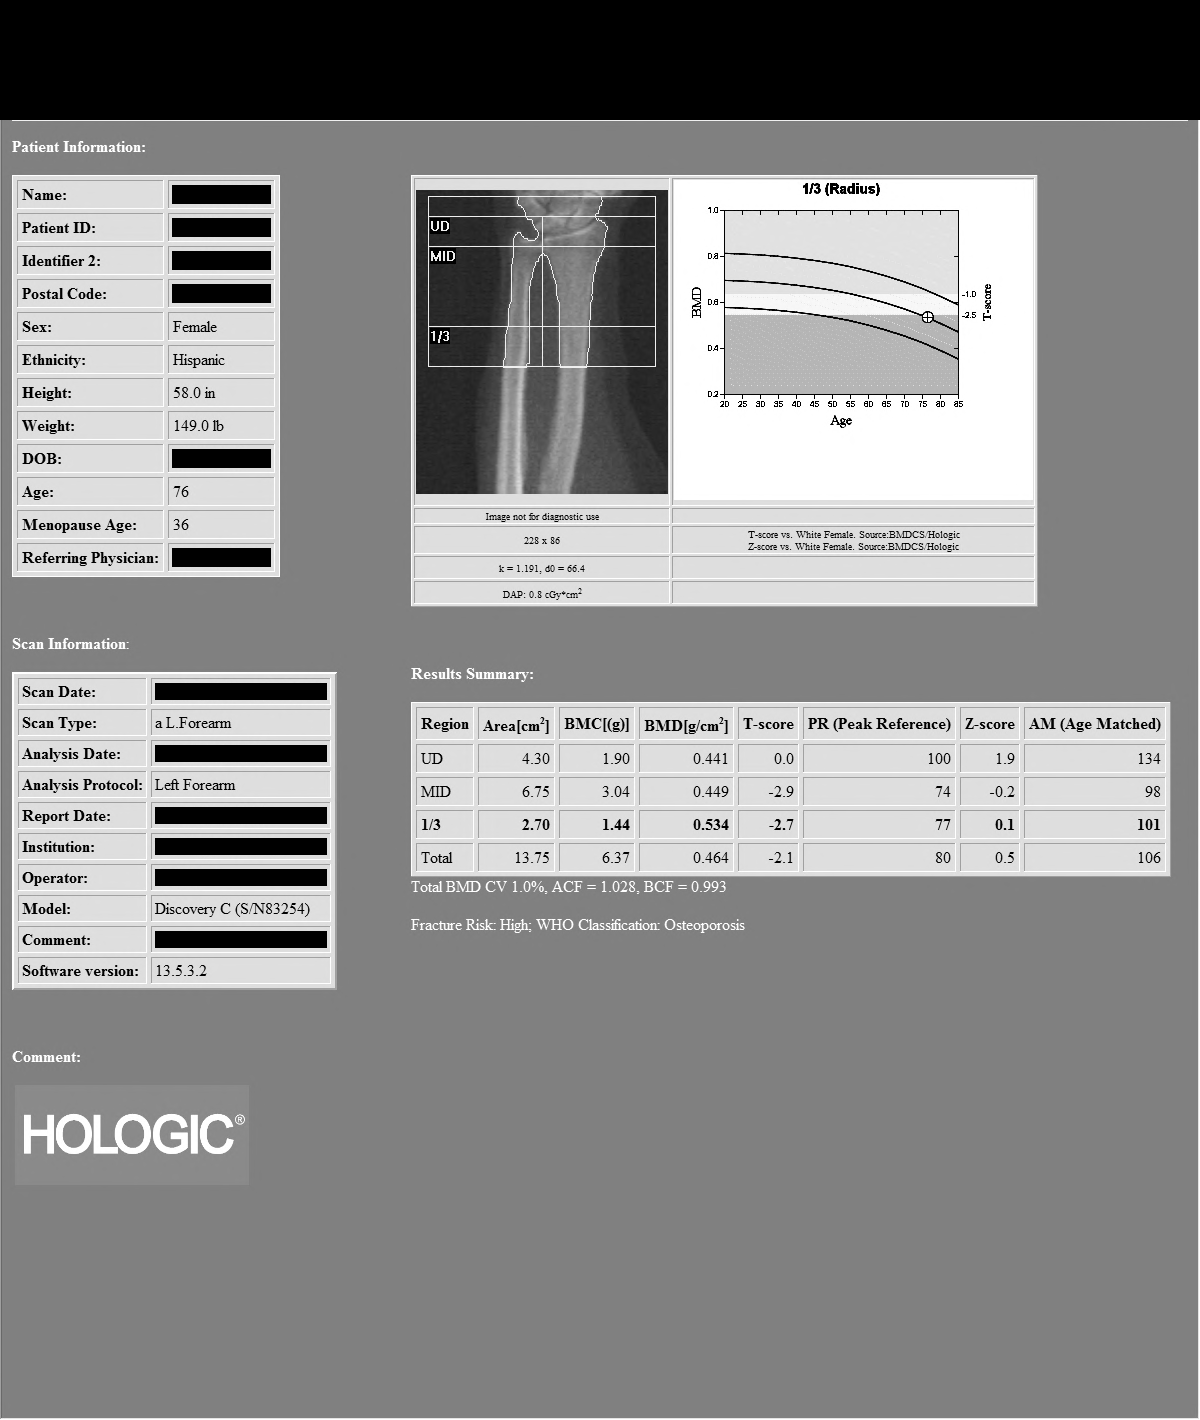

[Series 6: — · 1 of 1 slices shown (2 of 3)]
[im 1/1]
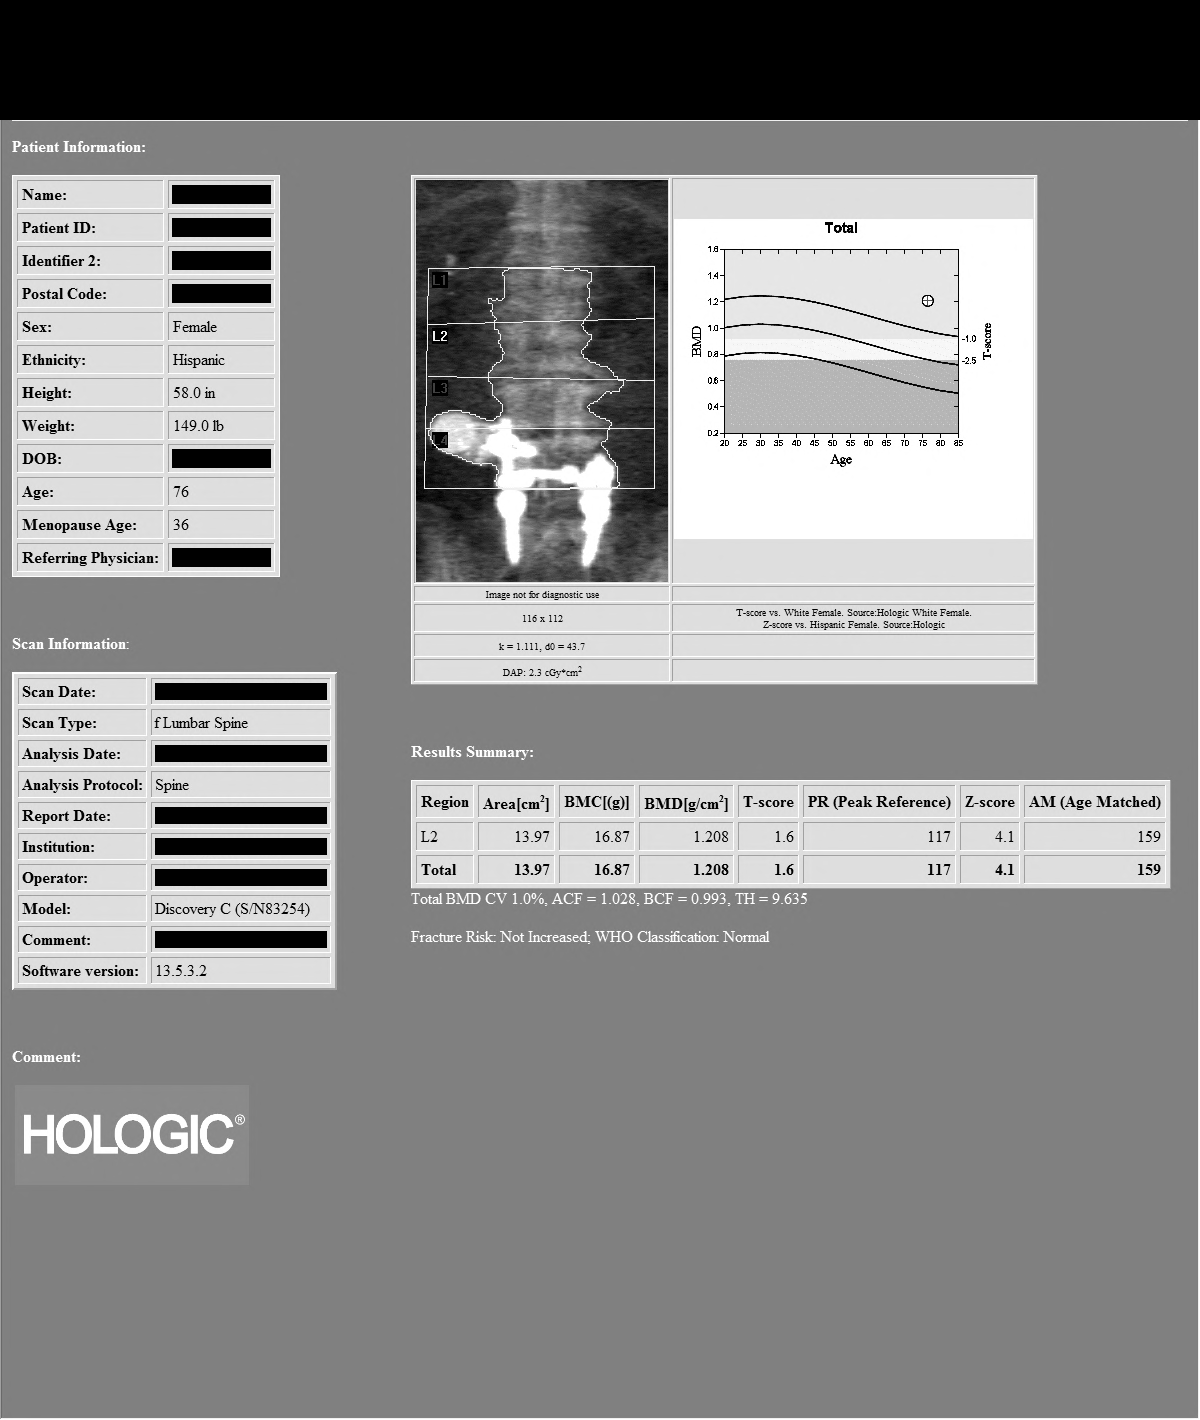

[Series 7: — · left · 1 of 1 slices shown (3 of 3)]
[im 1/1]
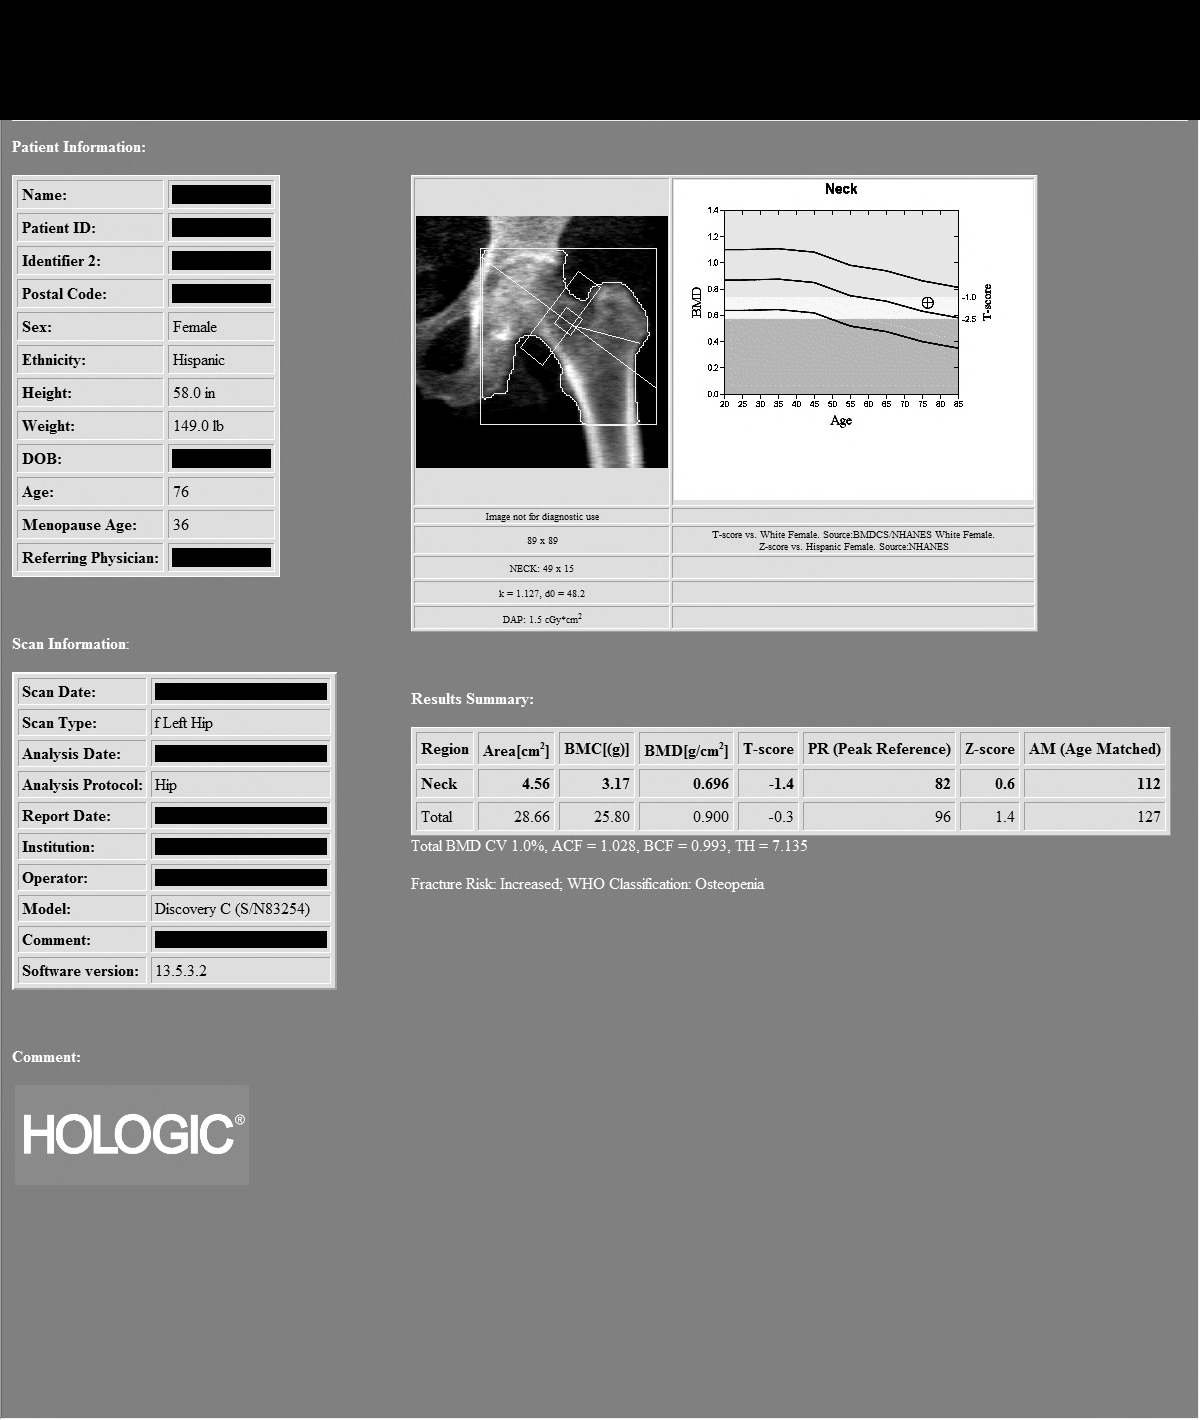

[3 of 3 positions shown; findings below may reference images not displayed]

IMPRESSION: As defined by World Health Organization, the patient meets the criteria for OSTEOPOROSIS based on forearm T-scores.

PATIENT DEMOGRAPHICS:  76-year-old Hispanic female.
FINDINGS: 1.    Review of scanogram images shows no factor invalidating scan results. L1, L3 and L4 were excluded due to artifact.

2.    The lumbar spine exam using L2 regions shows average Bone Mineral Density is 1.208 gm/cm2 of Hydroxyapatite.  The T-score (comparing patient with a young adult group) is 1.6 standard deviations ABOVE mean. The Z-score (comparing patient with an age-matched group) is 4.1 standard deviations ABOVE mean.

3.  The left hip exam using femoral neck region of interest shows average Bone Mineral Density is 0.696 gm/cm2 of Hydroxyapatite. The T-score (comparing patient with a young adult group) is 1.4 standard deviations BELOW mean. The Z-score (comparing patient with an age-matched group) is 0.6 standard deviations ABOVE mean.

4.   The left forearm exam using [DATE] radius region of interest shows average Bone Mineral Density is 0.534 gm/cm2 of Hydroxyapatite. The T-score (comparing patient with a young adult group) is 2.7 standard deviations BELOW mean. The Z-score (comparing patient with an age-matched group) is 0.1 standard deviations ABOVE mean.

RECOMMENDATIONS:  The patient states that she is taking vitamin D and calcium supplements on a regular basis.  The patient should continue being a nonsmoker and regular exercise to patient tolerance would be of benefit.  The patient is currently not taking prescribed medication for prevention of bone loss.  According to criteria established by the National Osteoporosis Foundation, the patient DOES meet the current indications for prescribed medical therapy.  The National Osteoporosis Foundation now recommends followup DXA scanning every two years in patients at risk regardless of whether the patient is undergoing pharmacological treatment.

World Health Organization criteria for diagnosis, please see link below.

[URL]

## 2018-04-28 IMAGING — US US LIVER
1 series · 14 of 25 positions shown · non-contrast
Comparison: None.

HISTORY: 76 year-old Female with abnormal liver function studies.
TECHNIQUE: Using real-time and Doppler ultrasound, the liver and gallbladder were evaluated.

[Series 1: us liver · 14 of 34 slices shown]
[im 1/34]
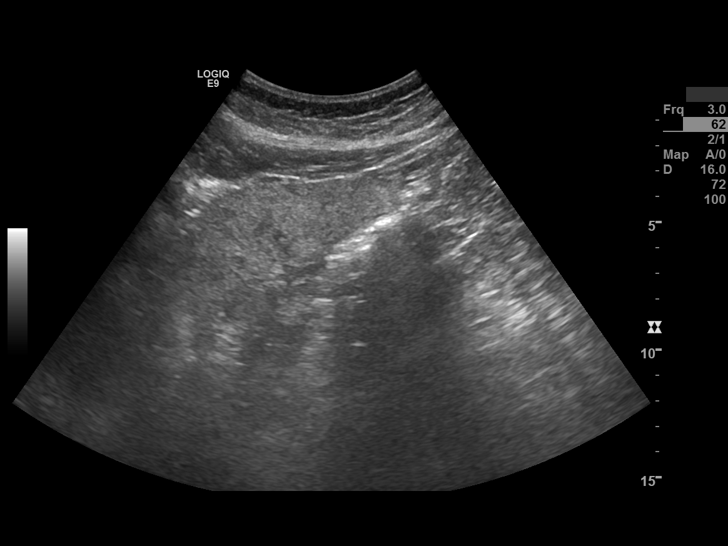
[im 3/34]
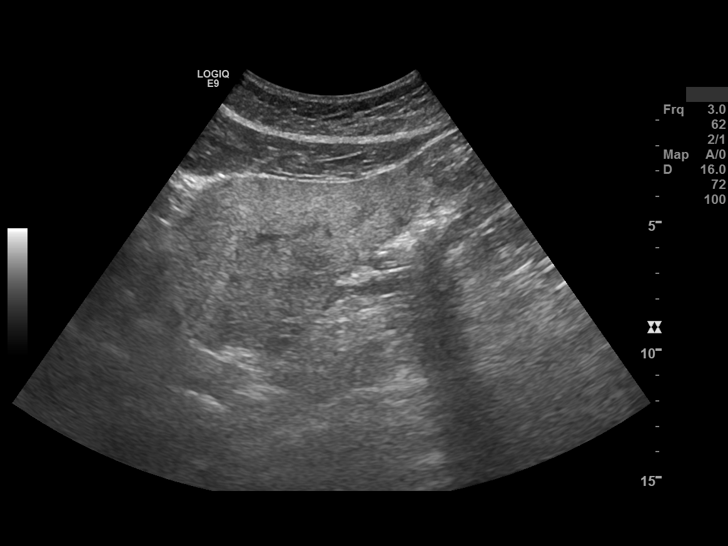
[im 6/34]
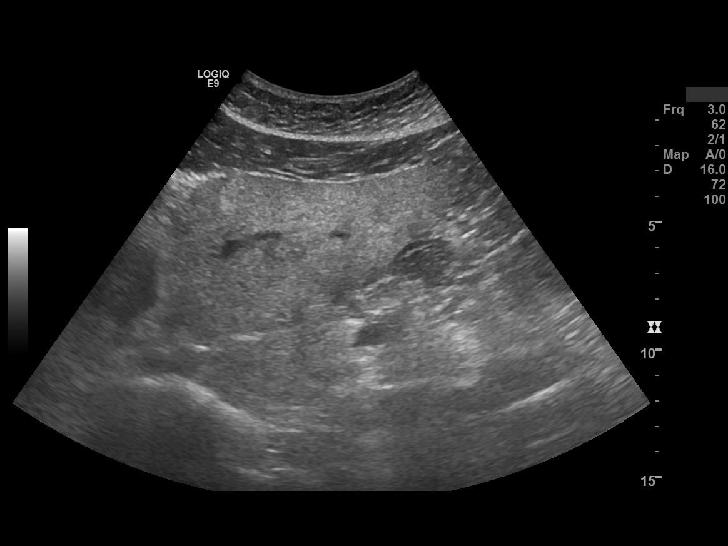
[im 9/34]
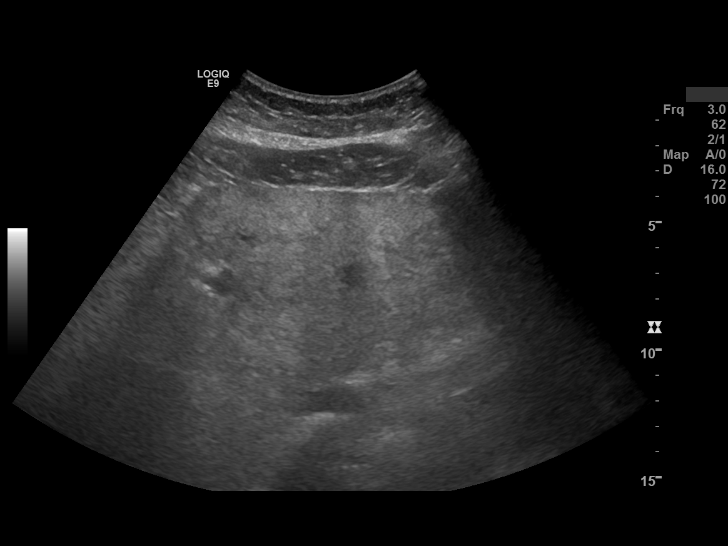
[im 12/34]
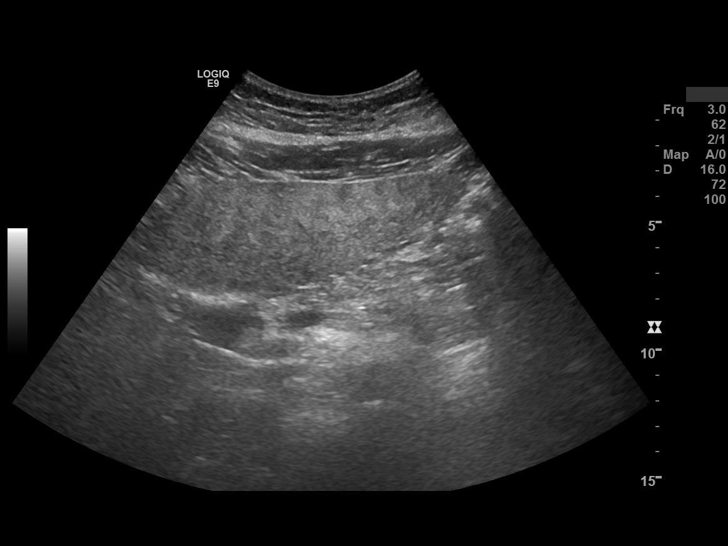
[im 13/34]
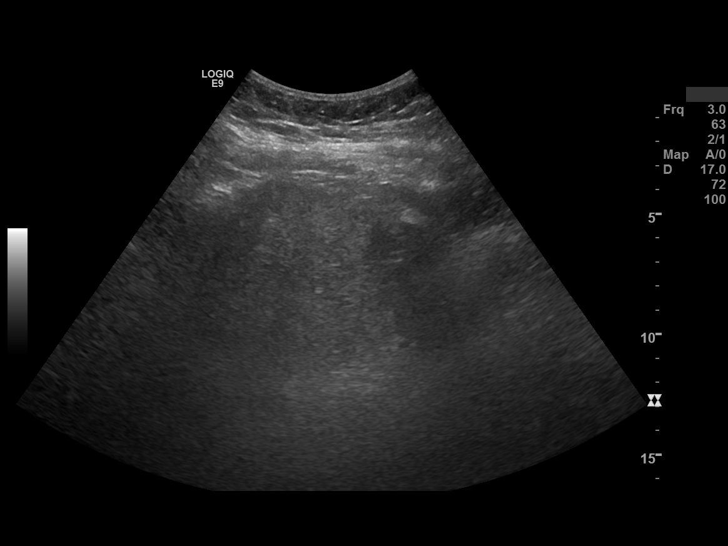
[im 16/34]
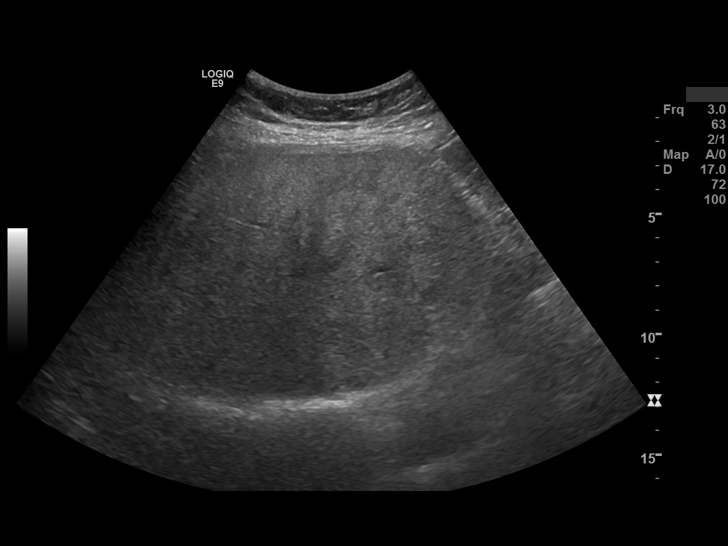
[im 18/34]
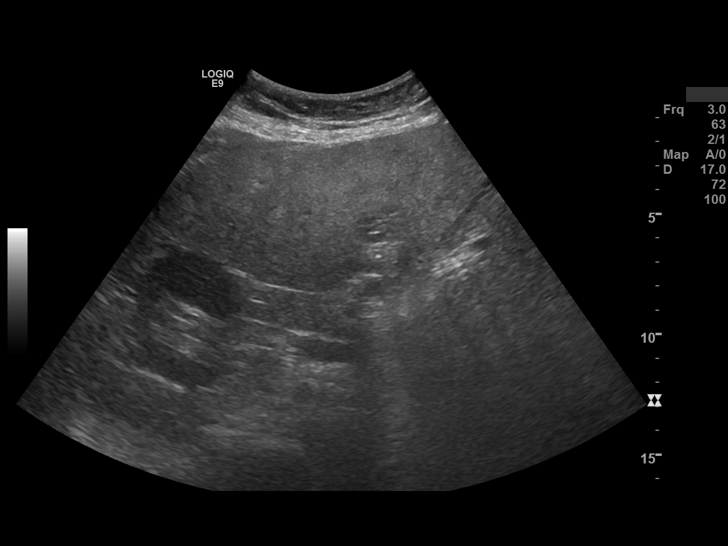
[im 21/34]
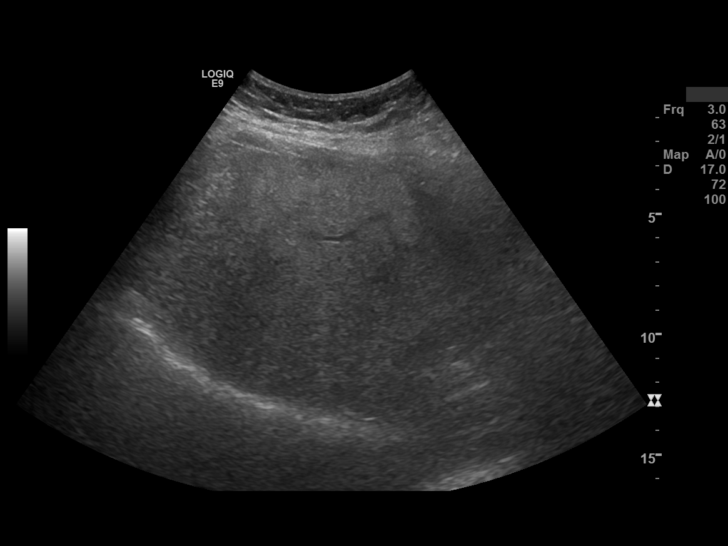
[im 23/34]
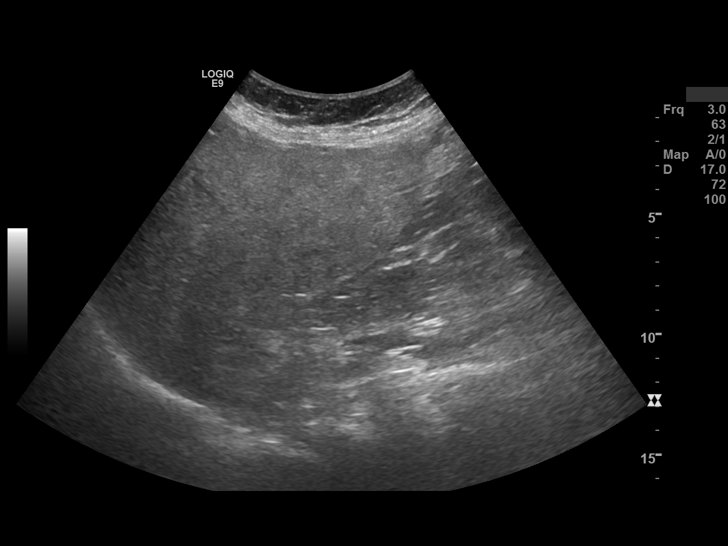
[im 25/34]
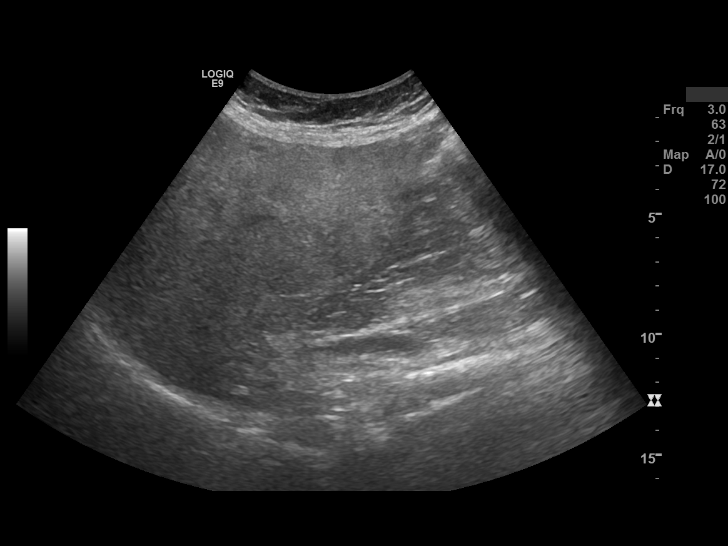
[im 28/34]
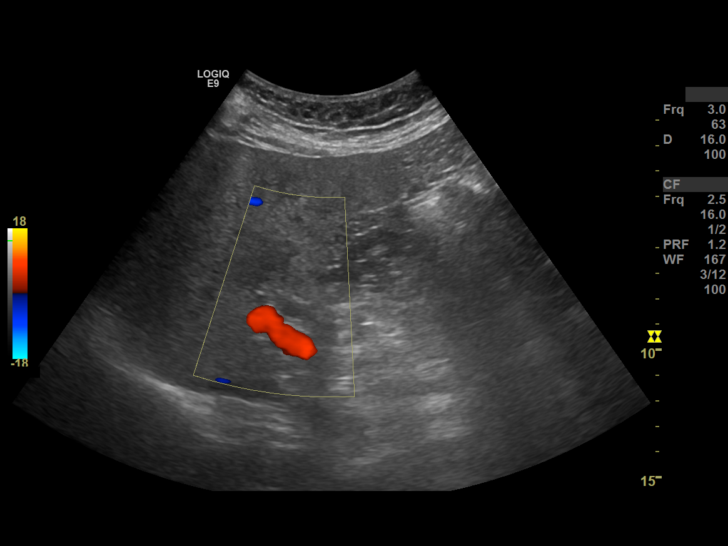
[im 31/34]
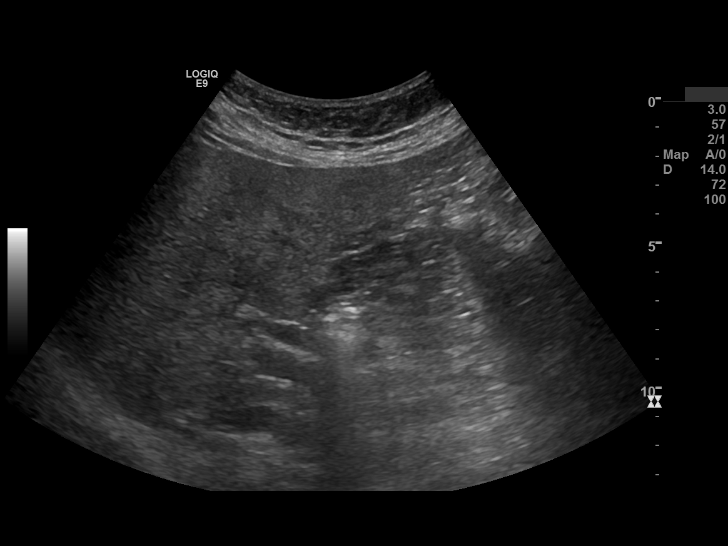
[im 34/34]
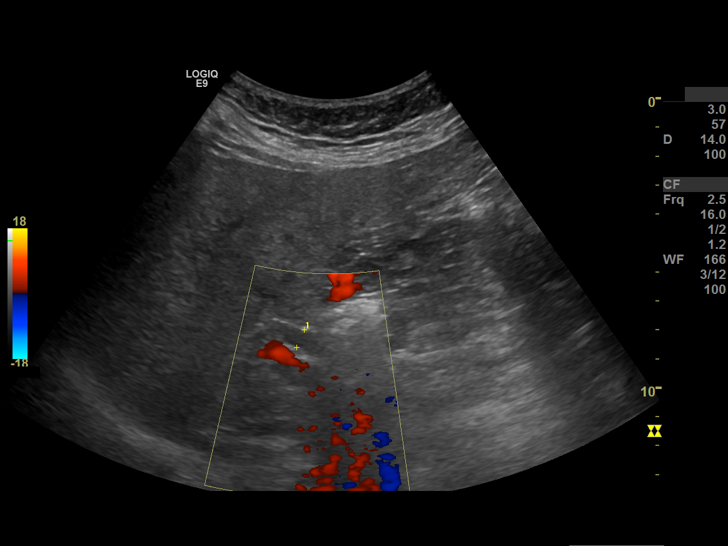

[14 of 25 positions shown; findings below may reference images not displayed]

FINDINGS: The liver demonstrates coarse echotexture. Hepatopetal flow is identified in the portal vein.

Patient is status post cholecystectomy. There is a negative sonographic Murphy's sign. The common bile duct measures 7 mm.
IMPRESSION: 1. Coarse echotexture involving the liver. Liver fibrosis/cirrhosis is a consideration. Consider ultrasound elastography study to differentiate liver fibrosis vs cirrhosis.

2. Status post cholecystectomy.

## 2018-04-28 IMAGING — CR L-SPINE 4 VWS MIN
1 series · 5 of 5 positions shown · non-contrast
Comparison: None.

HISTORY: Low back pain.
TECHNIQUE: L-SPINE 4 VWS MIN

[Series 1: ap · 0.17mm/px · 5 of 5 slices shown]
[im 1/5]
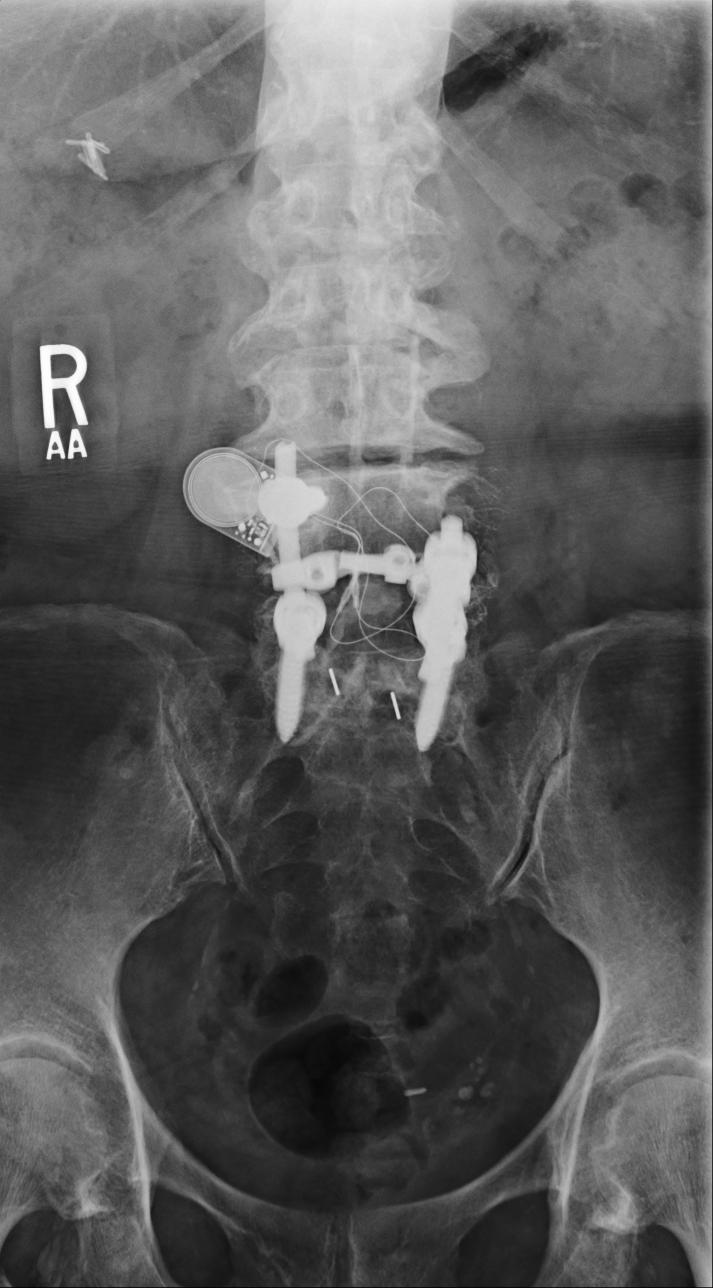
[im 2/5]
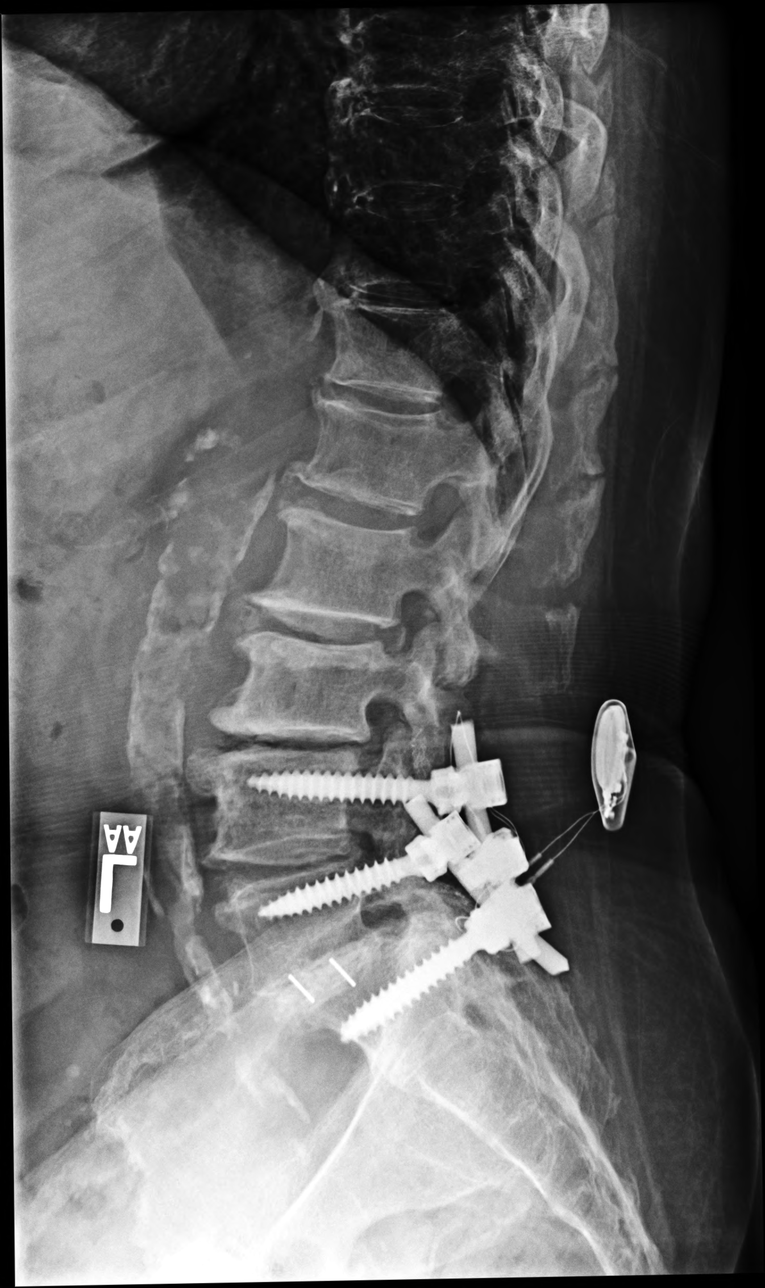
[im 3/5]
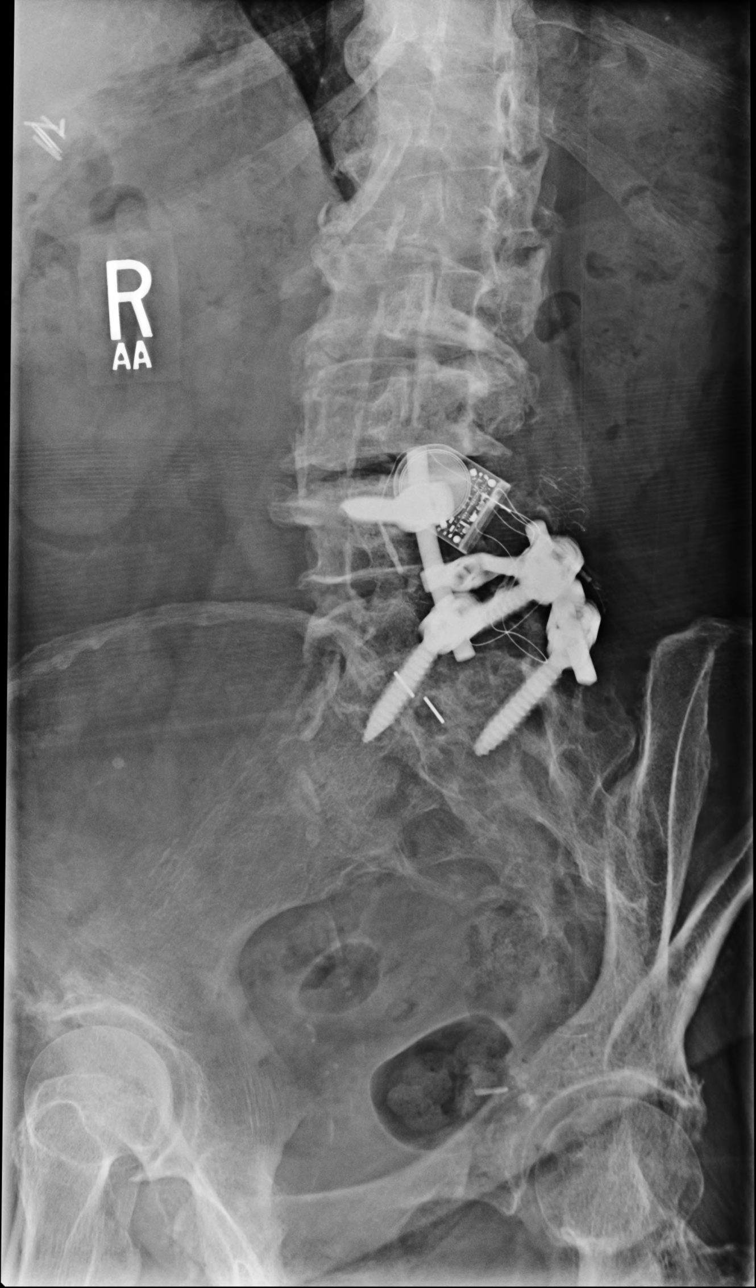
[im 4/5]
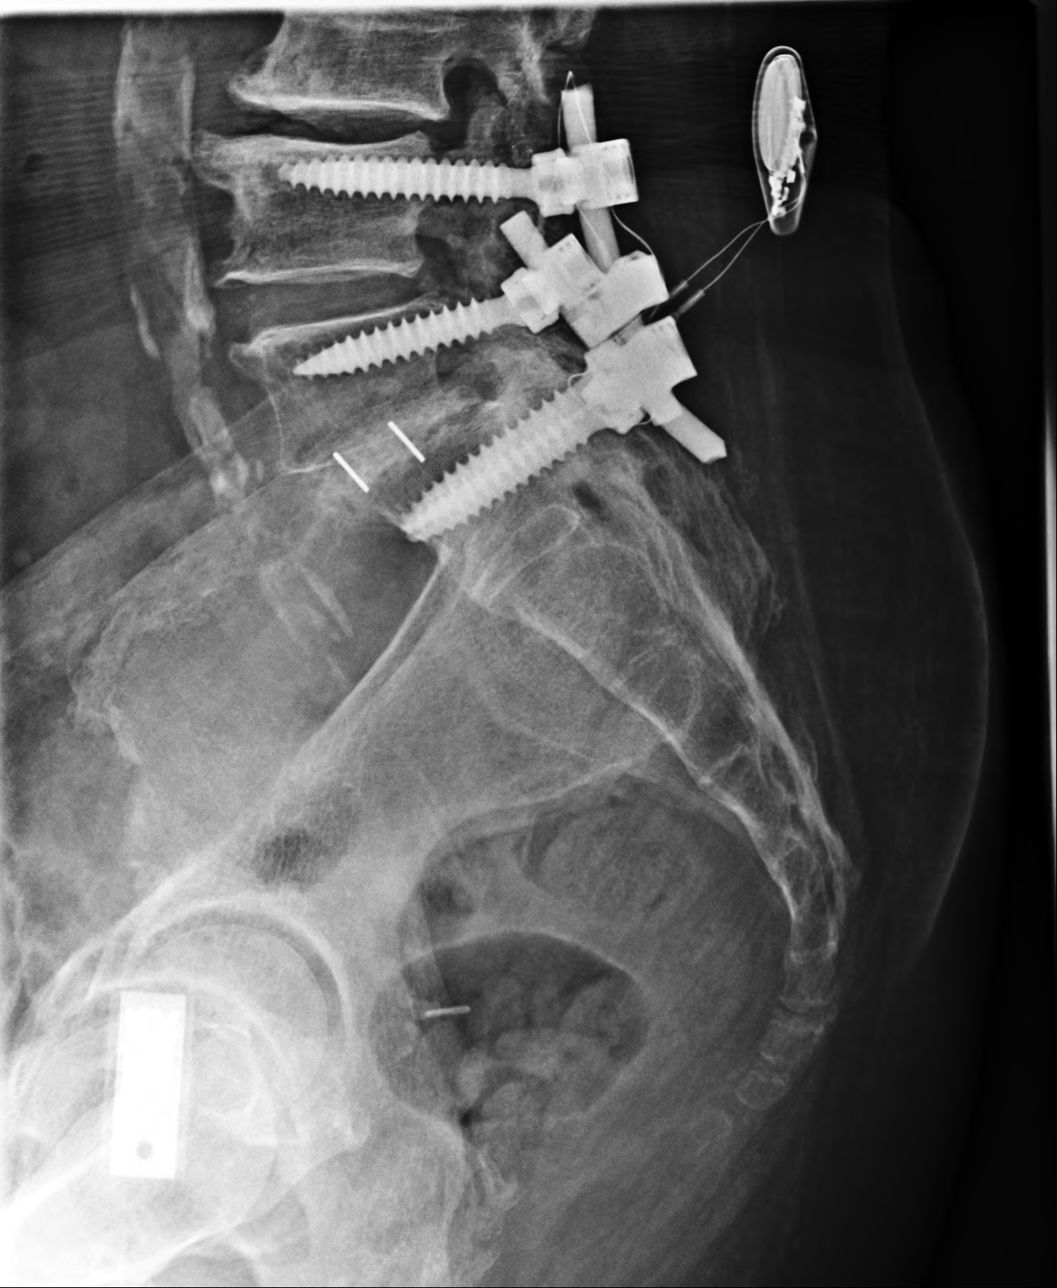
[im 5/5]
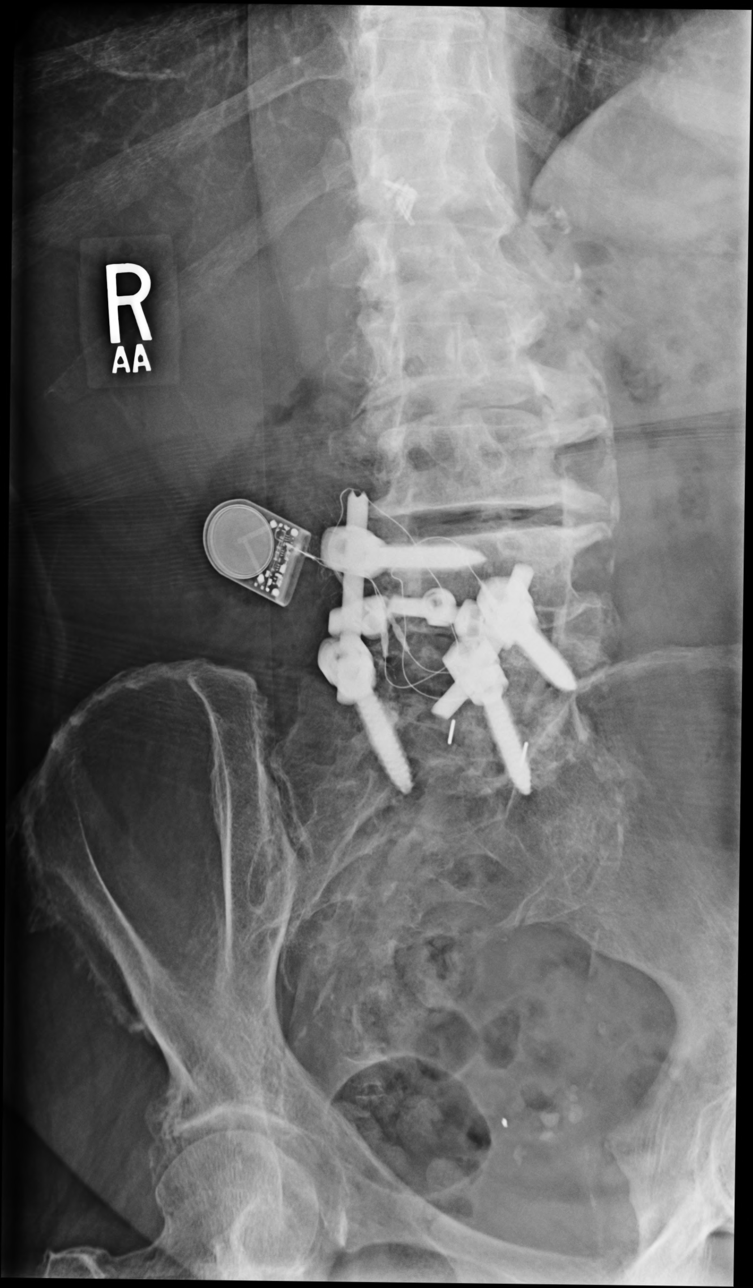

[5 of 5 positions shown; findings below may reference images not displayed]

FINDINGS: Five views of the lumbar spine demonstrate postop changes of decompressive laminectomy and fusion from L4 to S1. Pedicle screws and connecting rods. No evidence of hardware failure. No convincing evidence of loosening. Discectomy at L5-S1. Grade 1 anterolisthesis at L5-S1. Multilevel degenerative loss of disc space height at remaining disc space levels. Associated marginal endplate osteophytes. Moderately advanced multilevel facet arthropathy. Bone growth stimulator. Vascular calcifications of the aorta. Degenerative changes of the SI joints.
IMPRESSION: Postoperative changes, as discussed above. No hardware failure. No convincing evidence of loosening. Multilevel degenerative disc disease and facet arthropathy. Grade 1 anterolisthesis at L5-S1 where there are postoperative changes of discectomy.

## 2018-06-11 IMAGING — US US LIVER W/ELASTOGRAPHY
1 series · 13 of 25 positions shown · non-contrast
Comparison: Liver ultrasound from 04/28/2018.

INDICATIONS:  Abnormal liver enzymes.
TECHNIQUE: Multiple images from a real-time, duplex and color Doppler ultrasound of liver and biliary system are obtained.

[Series 1: us liver w/elastography · 13 of 36 slices shown]
[im 1/36]
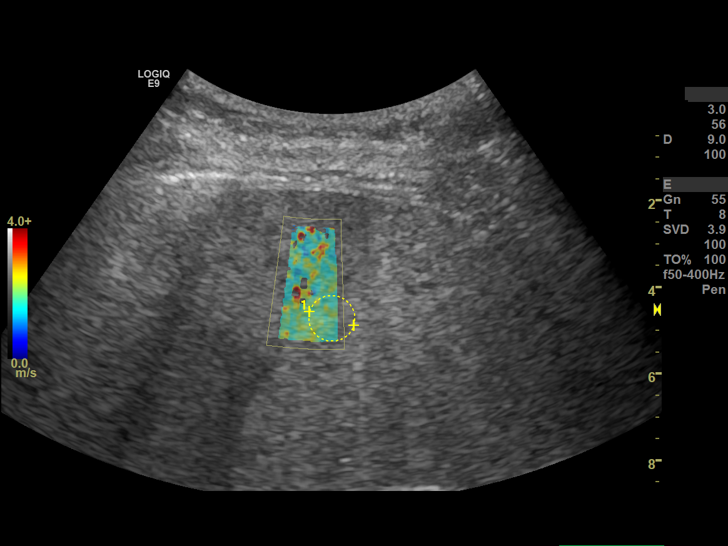
[im 3/36]
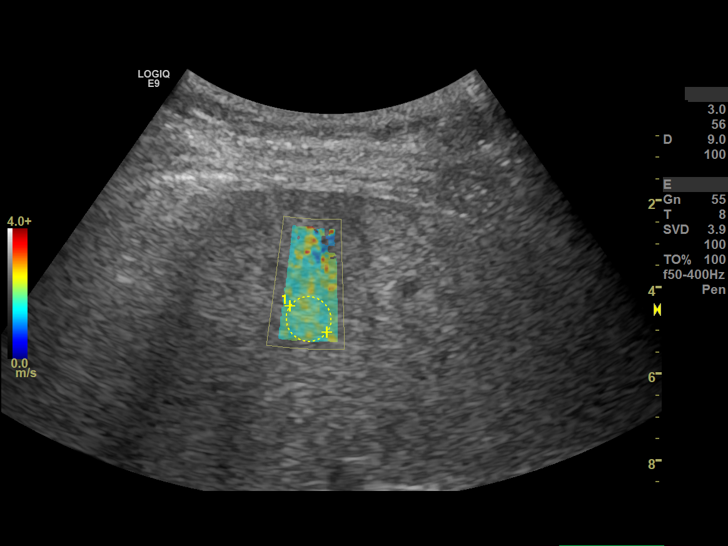
[im 6/36]
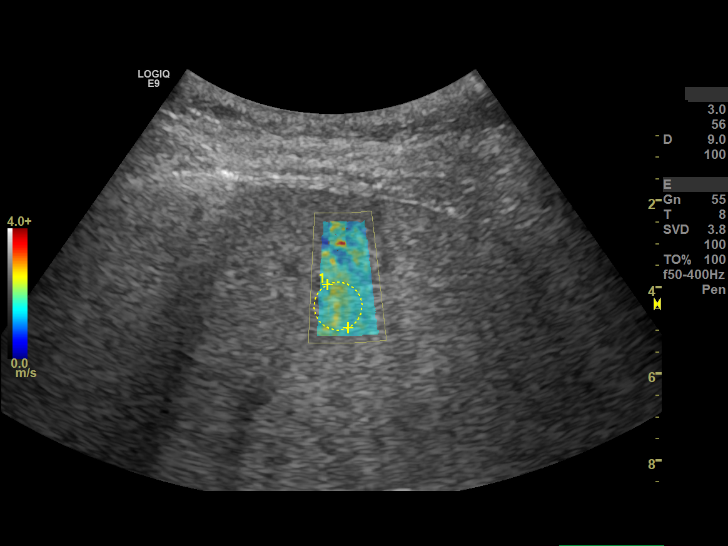
[im 9/36]
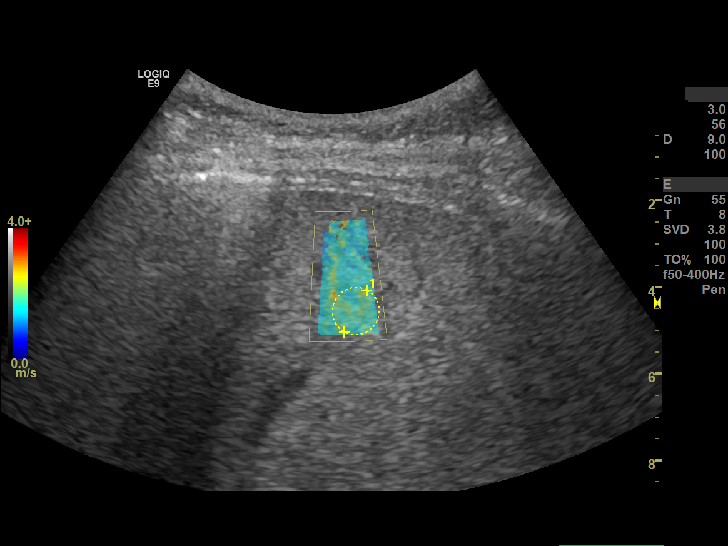
[im 12/36]
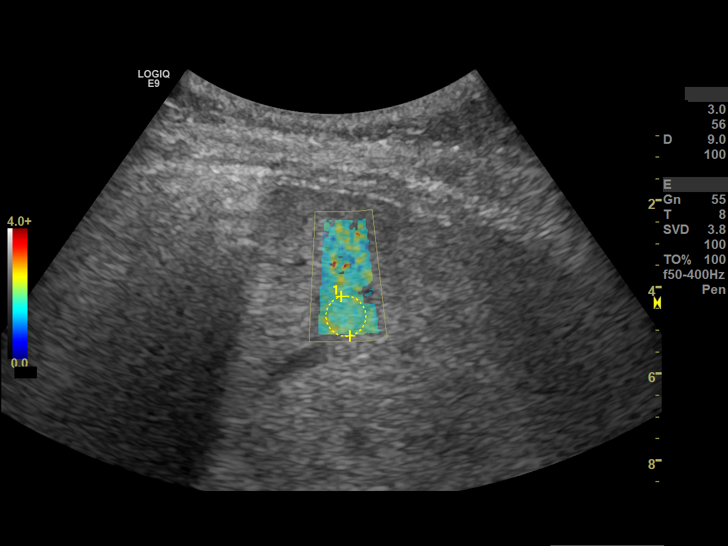
[im 15/36]
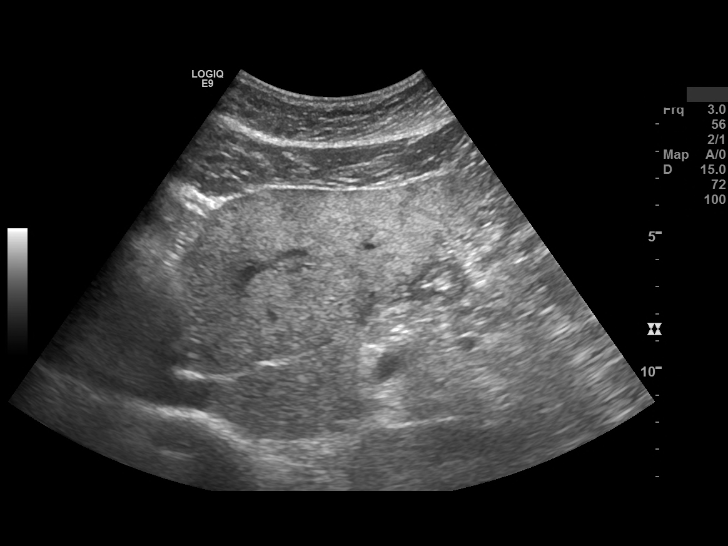
[im 18/36]
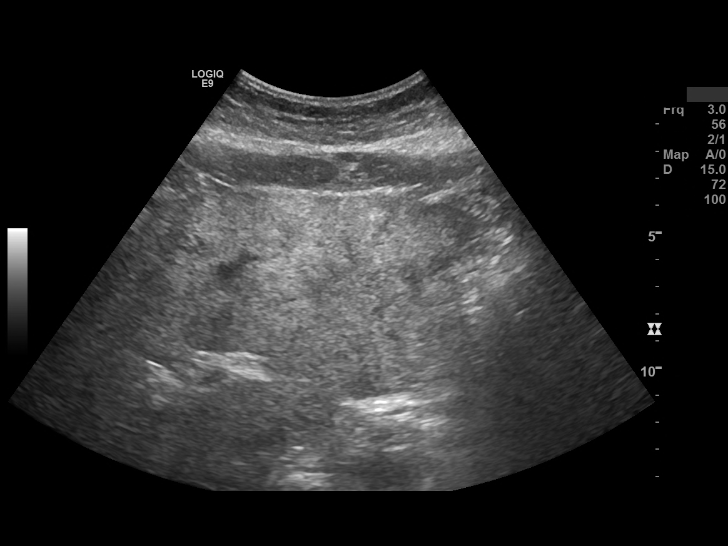
[im 21/36]
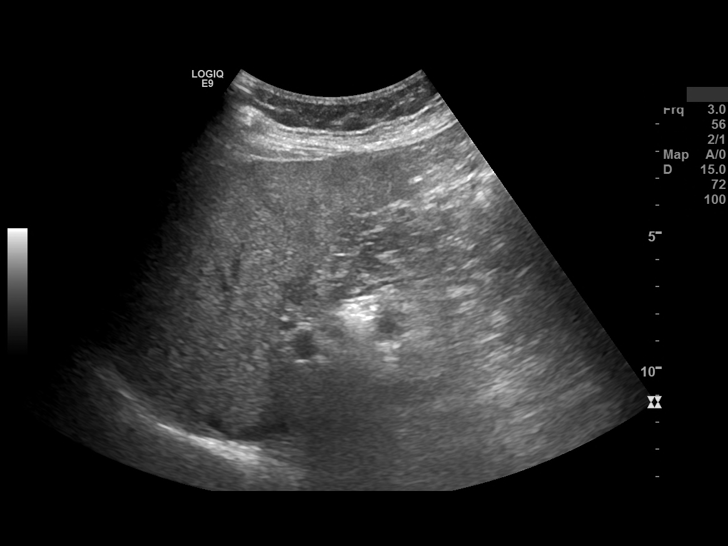
[im 24/36]
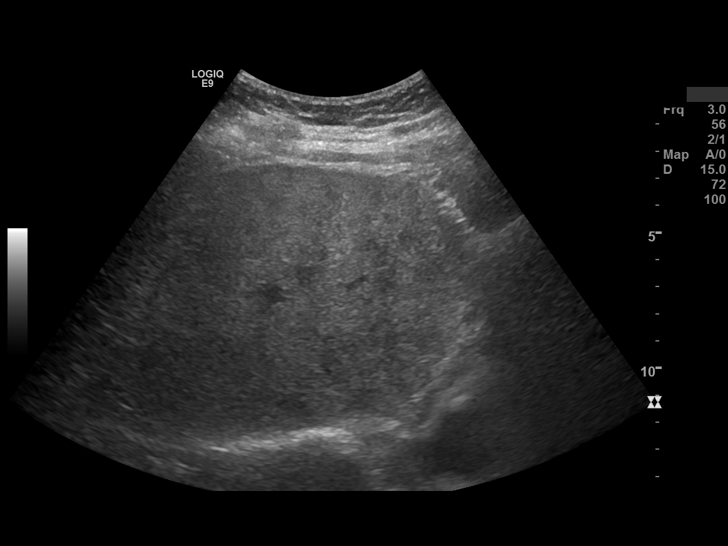
[im 27/36]
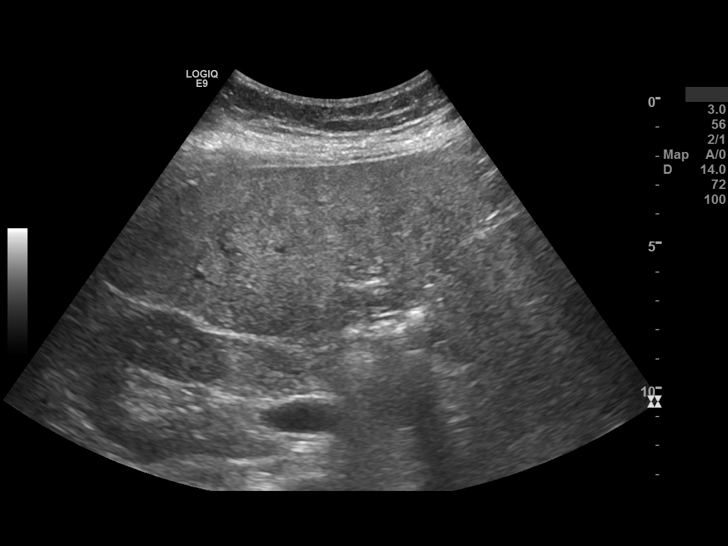
[im 30/36]
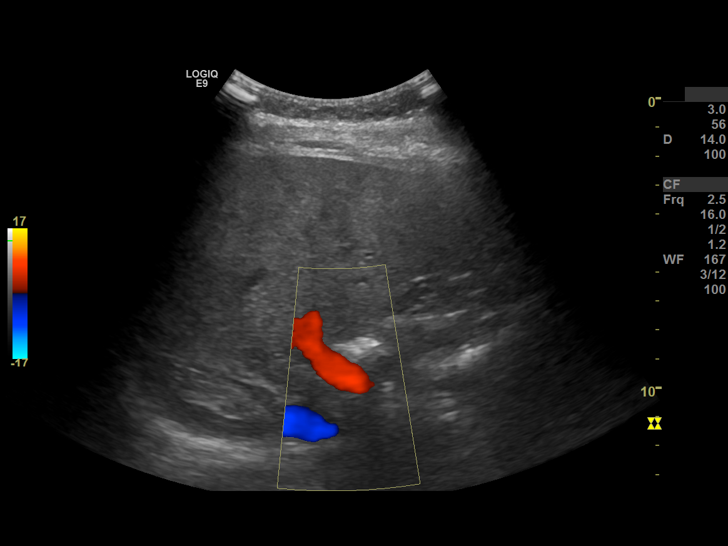
[im 33/36]
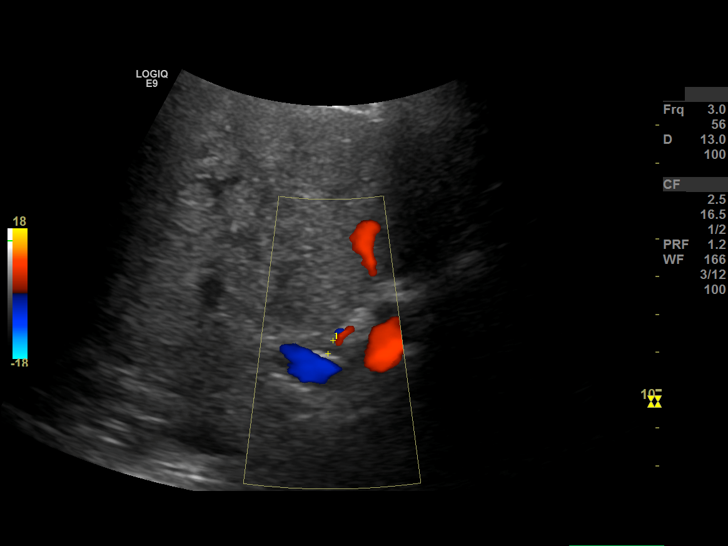
[im 36/36]
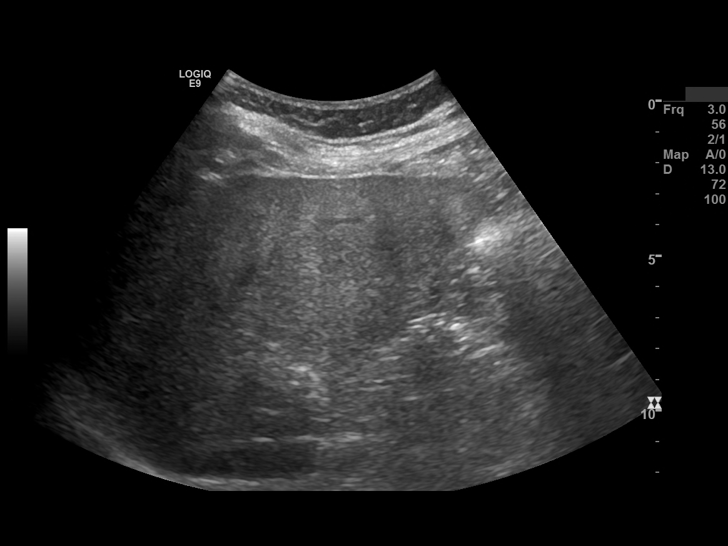

[13 of 25 positions shown; findings below may reference images not displayed]

FINDINGS: The right liver lobe is 141 mm in craniocaudal extent at the midclavicular line. Moderate heterogeneous increased echogenicity is seen diffusely within the liver. Hepatic capsule remains relatively smooth. No convincing solid mass or dilated intrahepatic bile duct is appreciated. Portal vein is patent on Doppler ultrasound and shows normal blood flow towards the liver. Main portal vein is normal in size at 8 mm.

The gallbladder is surgically absent.  Common bile duct is 4 mm in diameter and free of calculus. Sonographic Murphy sign is negative.
IMPRESSION: 1.   Heterogeneous moderate increased liver echogenicity can represent hepatic steatosis or developing liver fibrosis with parenchymal disease. No current mass or dilated intrahepatic bile duct is seen. Blood flow remains towards the liver in the main portal vein.

2.  Prior cholecystectomy with normal common bile duct size of 4 mm.

US ELASTOGRAPHY E9

12 shear wave measurements were acquired using a right intercostal approach with patient in a LLO position, with median of 1.82 m/s (range, 1.74 - 1.87 m/s). The IQR/median value is 3.8%. Measurements were obtained using a C1-6 transducer.
IMPRESSION: 1. Moderate to severe liver fibrosis staging. METAVIR score is F3. Findings are concerning for NASH. GI consultation is encouraged.

2. Median liver shear wave speed = 1.82 m/s.

GE E9 Shear Wave Elastography Reference Ranges

Liver Fibrosis Staging:        METAVIR Score                    m/s

Normal-Mild                        F1                              1.35-1.66 m/s

Mild-Moderate                    F2                              1.66-1.77 m/s

Moderate-Severe                 F3                              1.77-1.99 m/s

Cirrhosis                             F4                                     >1.99 m/s

Note, ultrasound shear wave measurements may also be affected by presence of hepatic congestion, steatosis and inflammation. Shear wave speed measurements should be interpreted in conjunction with other available clinical data, and they should not be considered interchangeable with MRI- derived stiffness measurements at this time.

ABNORMAL FINDINGS!

STAT Fax

## 2018-09-09 IMAGING — MG MAMMO SCRN BIL W/CAD TOMO
8 series · 8 of 24 positions shown · non-contrast
Comparison: none

Images Obtained from [facility]
HISTORY: Patient is 77 years old and is seen for screening. The patient has a history of left biopsy at age 71 - benign. The patient has no personal history of cancer. The patient has no family history of
breast cancer.
FILMS COMPARED:
The present examination has been compared to prior imaging studies.
TECHNIQUE: Bilateral 2-D digital screening mammogram was performed followed by 3-D tomosynthesis.  Current study was also evaluated with a computer aided detection (CAD) system.
MAMMOGRAM FINDINGS:
The breasts are almost entirely fatty.
There are benign-type calcifications in the left breast.
No suspicious abnormality is seen in either breast.

[L CC]
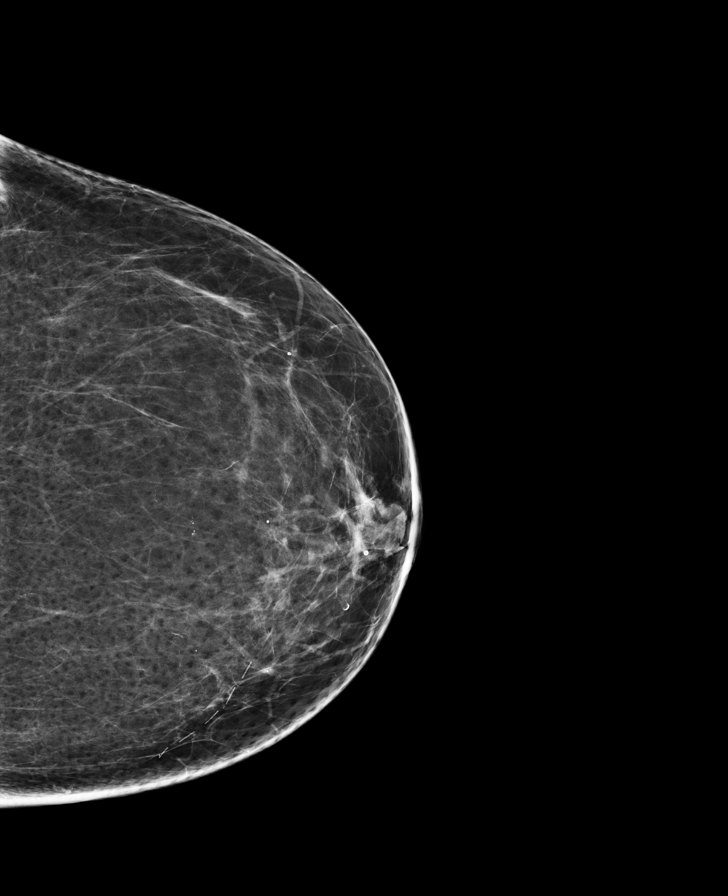

[R CC]
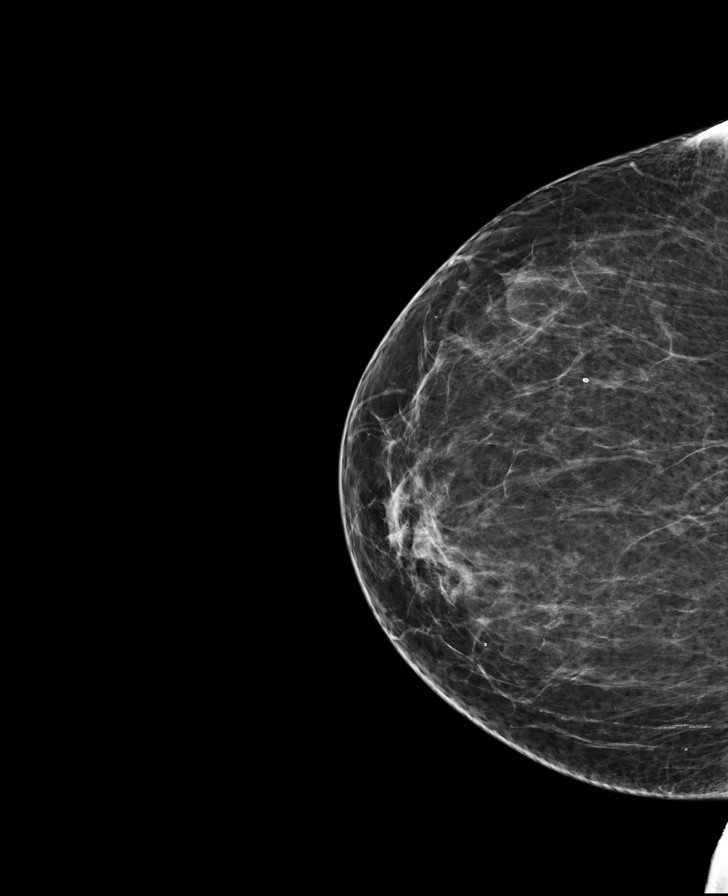

[R MLO]
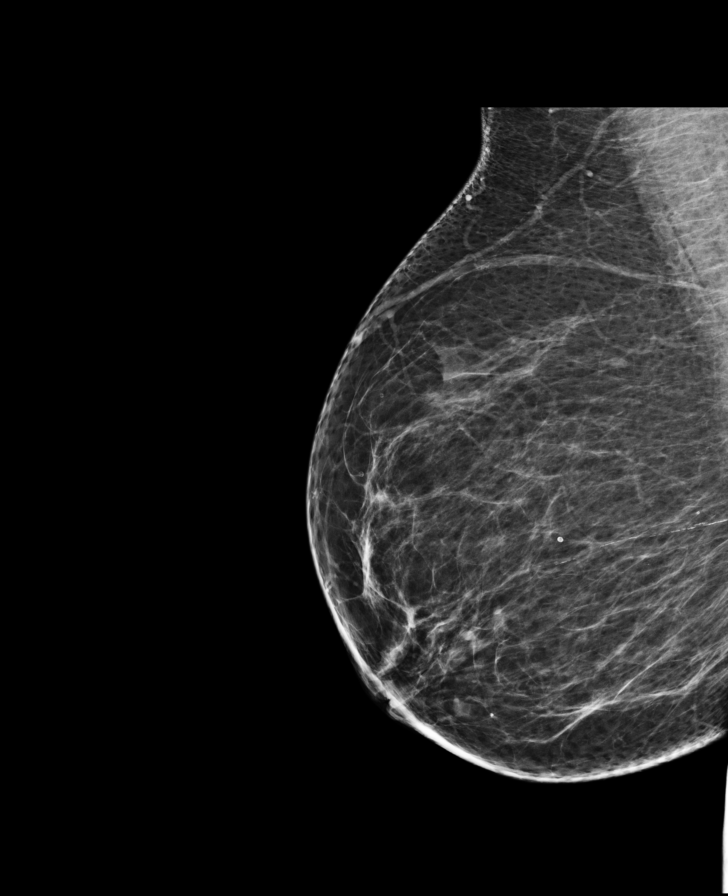

[L MLO]
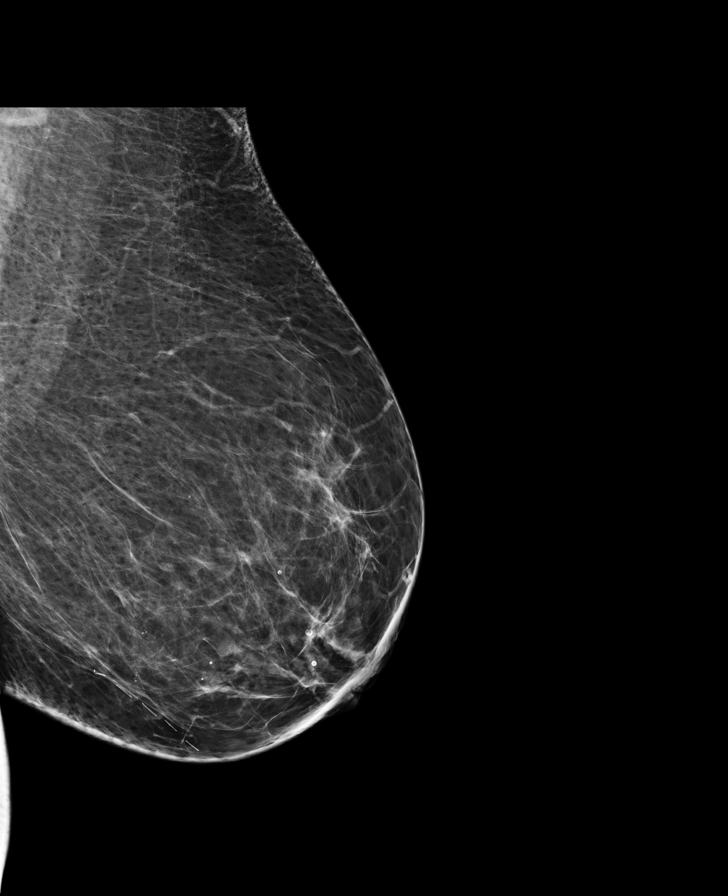

[L MLO tomo · tomo slice 41/82.0]
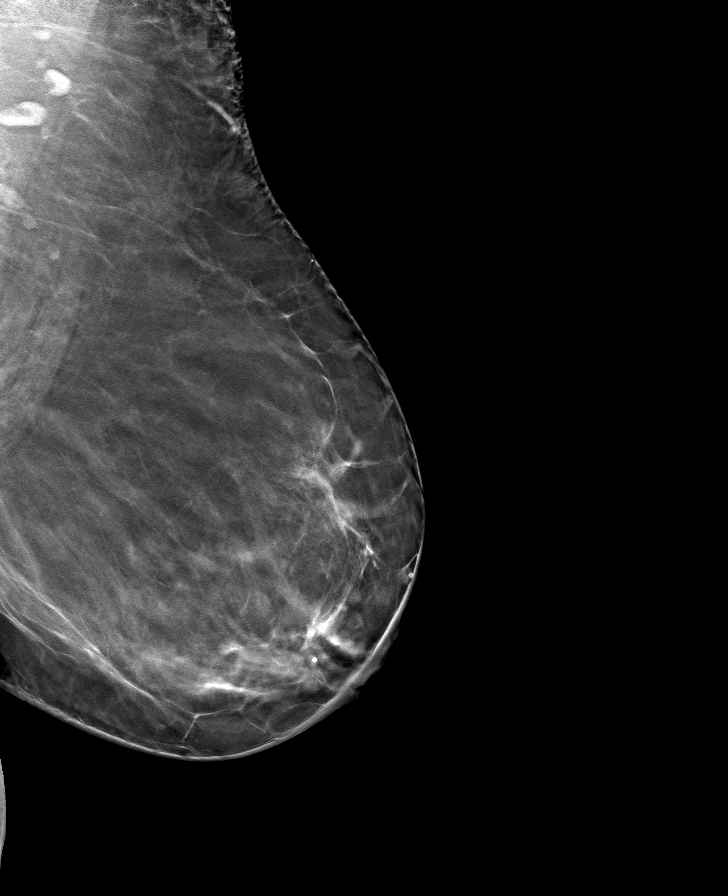

[R MLO tomo · tomo slice 38/75.0]
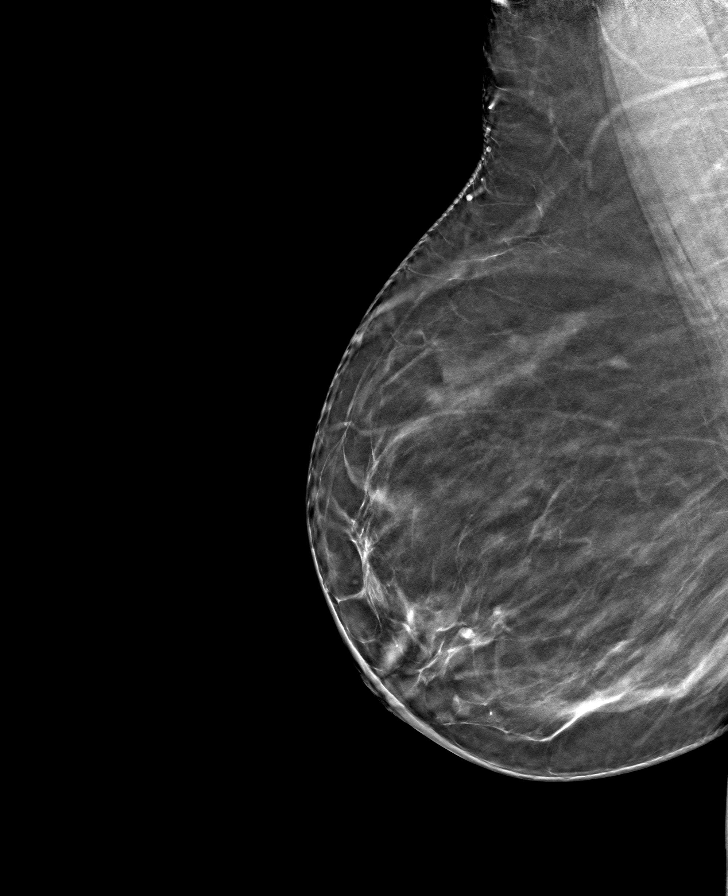

[R CC tomo · tomo slice 37/73.0]
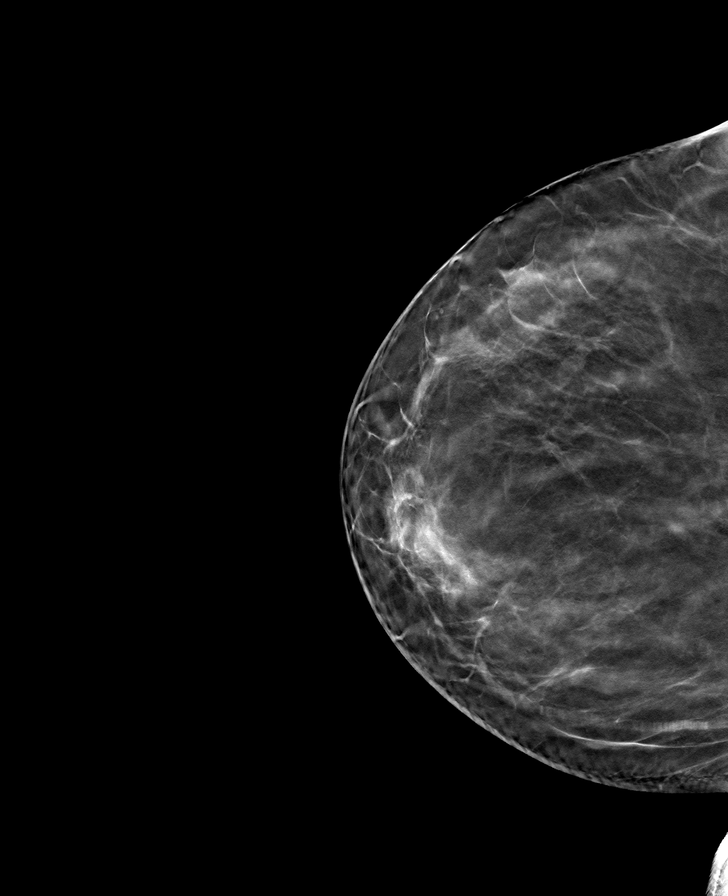

[L CC tomo · tomo slice 35/69.0]
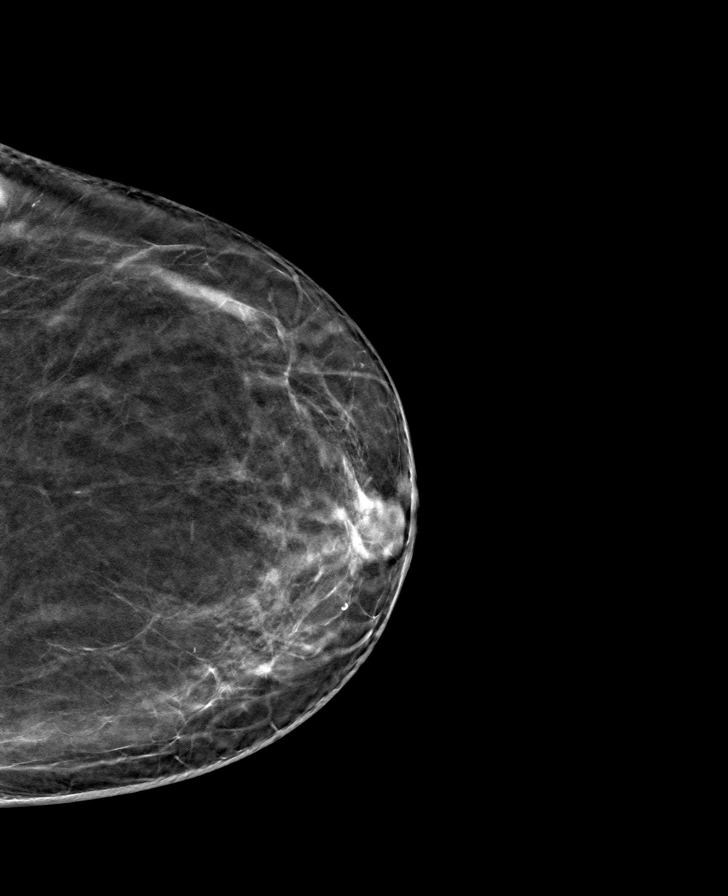

[8 of 24 positions shown; findings below may reference images not displayed]

IMPRESSION: There is no mammographic evidence of malignancy.
Screening mammogram recommended in 1 year.
BI-RADS Category 2: Benign

## 2019-07-15 IMAGING — US US LIVER W/ELASTOGRAPHY
1 series · 13 of 25 positions shown · non-contrast
Comparison: Liver ultrasound with elastography 06/11/2018

HISTORY: 77 years old Female with fatty liver
TECHNIQUE: Using a GE Logiq E9 ultrasound unit with a C1-6 transducer, ten shear wave measurements were acquired using a right intercostal approach.  Additional gray-scale and color Doppler images of the right upper quadrant were obtained with ultrasound.

[Series 1: us liver w/elastography · 13 of 47 slices shown]
[im 1/47]
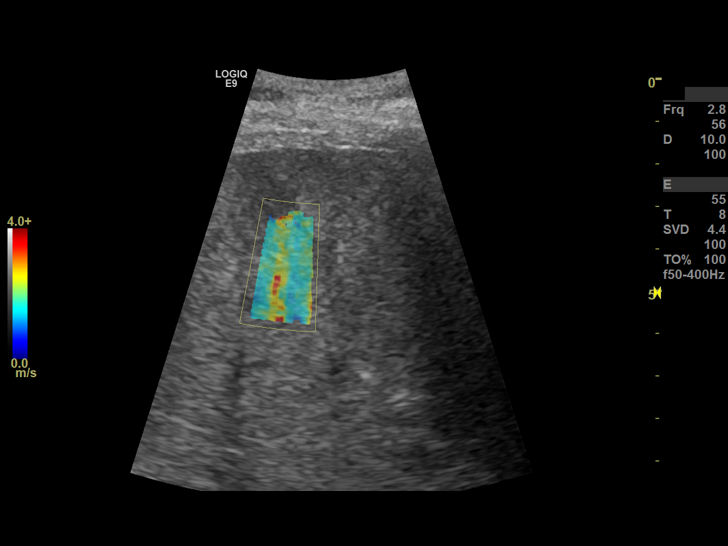
[im 4/47]
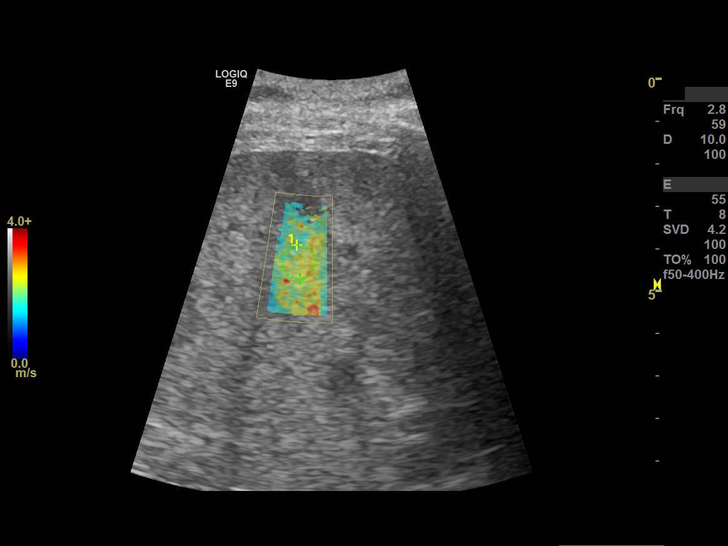
[im 8/47]
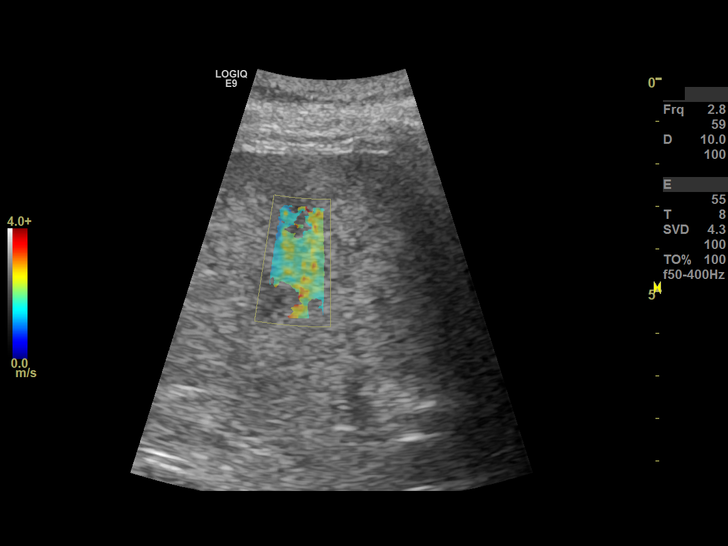
[im 12/47]
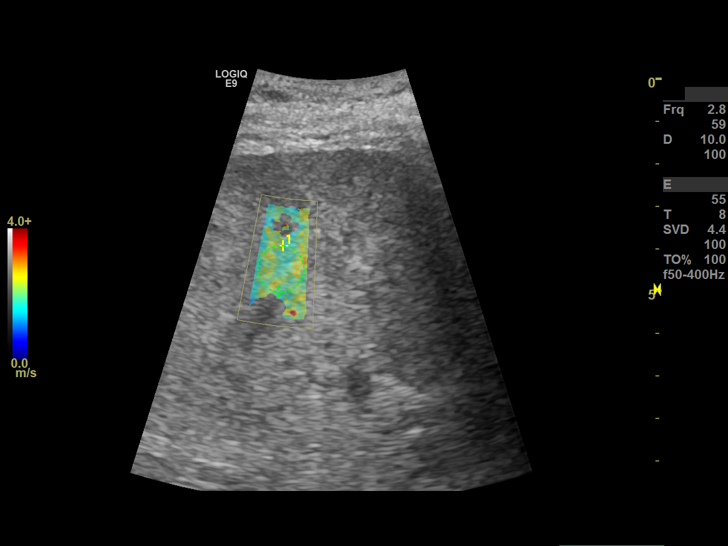
[im 16/47]
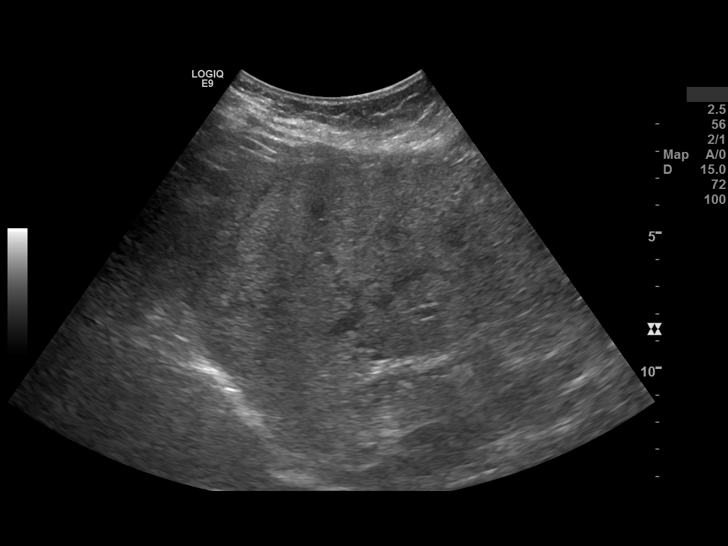
[im 20/47]
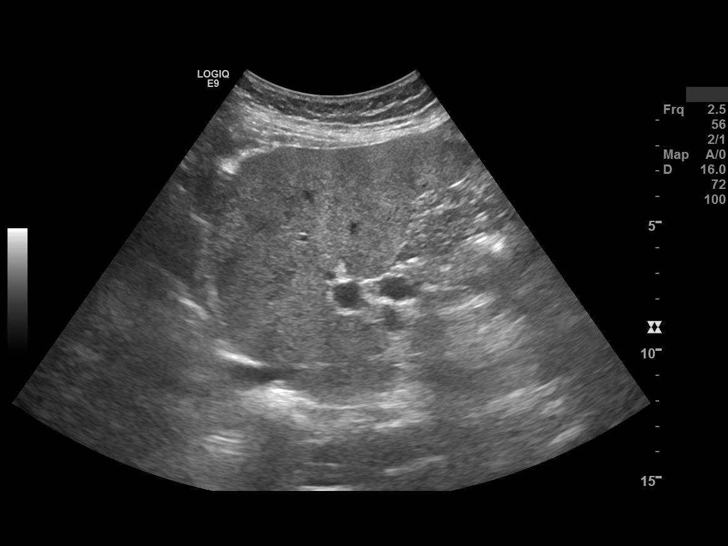
[im 24/47]
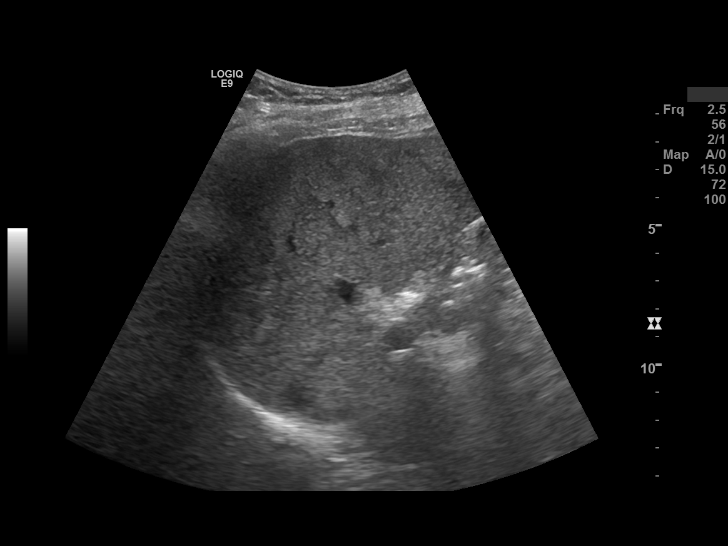
[im 27/47]
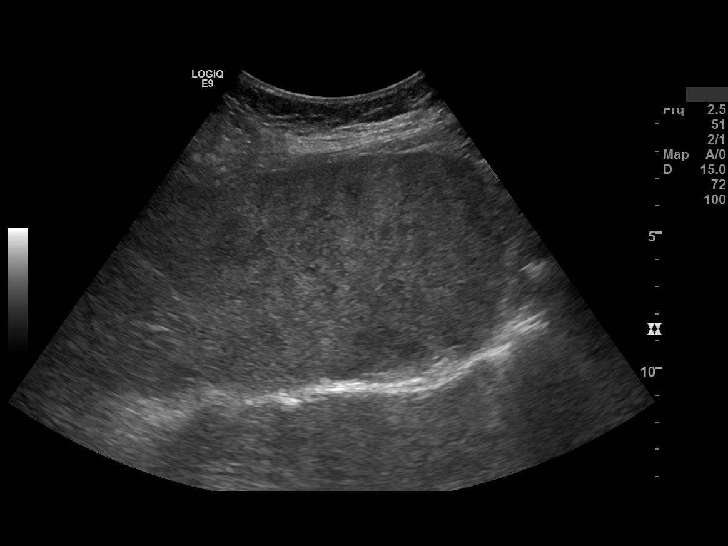
[im 31/47]
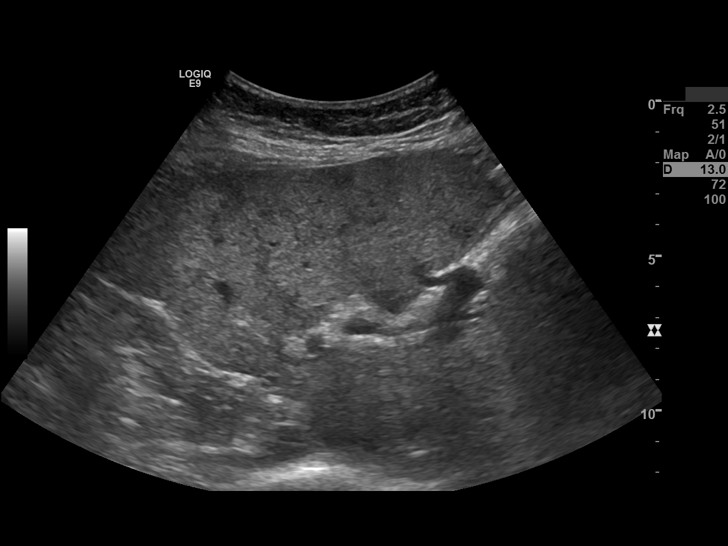
[im 35/47]
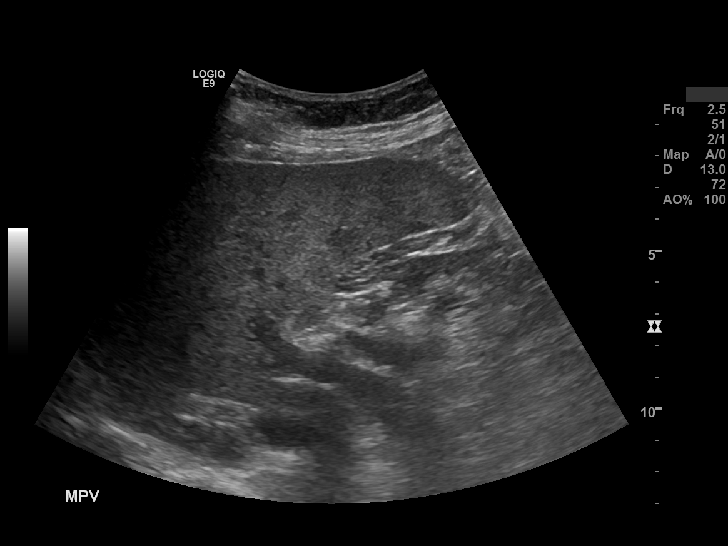
[im 39/47]
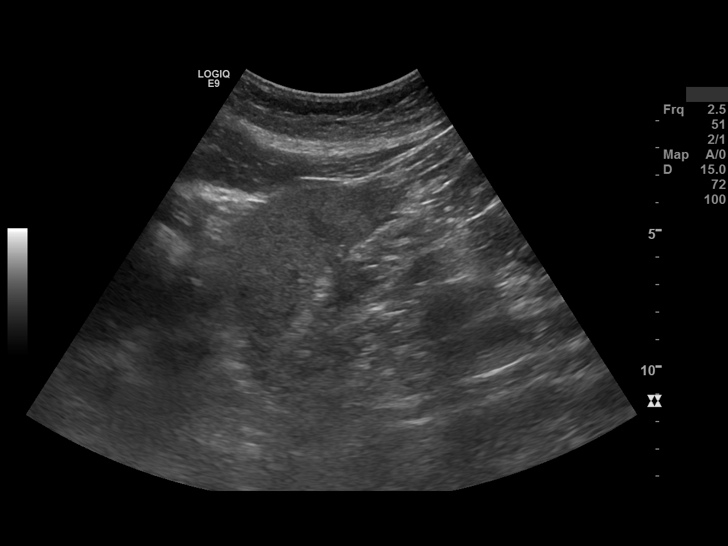
[im 43/47]
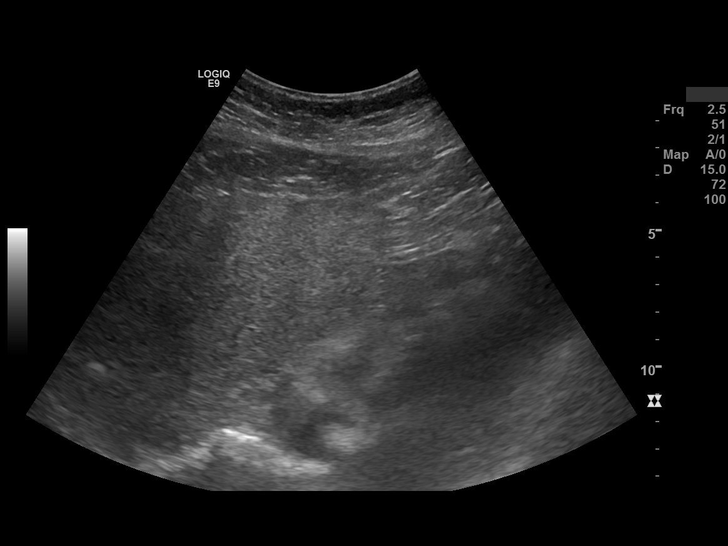
[im 47/47]
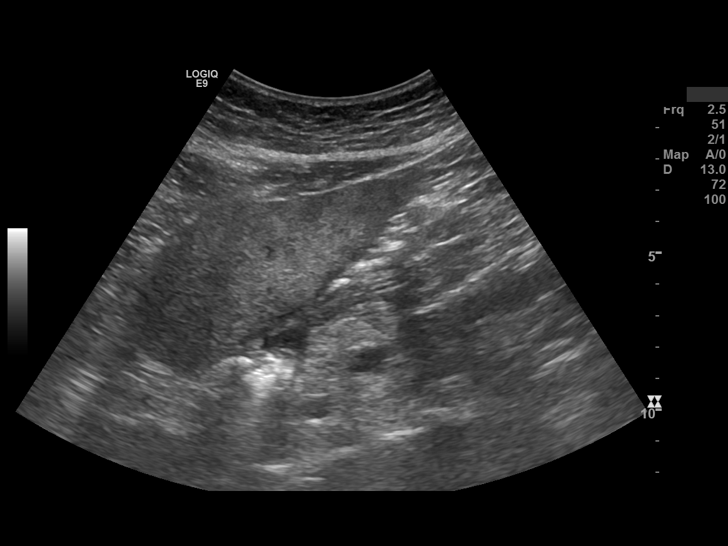

[13 of 25 positions shown; findings below may reference images not displayed]

FINDINGS: Image quality: Satisfactory.

LIVER: 

*
Size: Normal in size, measuring 152 mm in length. 

*
Parenchymal Echogenicity: increased

*
Parenchymal Echotexture: coarsened. 

*
Surface: smooth. 

*
Focal Hepatic Lesions: None.

*
Portal Vein: Patent with appropriate direction of flow and normal spectral waveform.

ELASTOGRAPHY ASSESSMENT:

Median shear wave velocity: 2.07 m/s

IQR/median: 5%.

GE Logiq E9 Shear Wave Elastography Reference Ranges

Liver Fibrosis Staging:        METAVIR Score                    m/s

Normal-Mild                        F1                              1.35-1.66 m/s

Mild-Moderate                    F2                              1.66-1.77 m/s

Moderate-Severe               F3                              1.77-1.99 m/s

Cirrhosis                             F4                                     >1.99 m/s

Disclaimer: Interpretation of ultrasound elastography measurements should take into consideration correct scanning protocol, patient factors such as body habitus, ability to hold breath and at least 4-6h of fasting. Iron overload in the liver can interfere with elastography and may render inaccurate measurements. 

BILIARY DUCTAL SYSTEM: 

*
Dilation: No intra- or extrahepatic biliary ductal dilation.  

*
Common Bile Duct: Measures 7.4 mm in greatest diameter. 

GALLBLADDER: 

Status post cholecystectomy.

RIGHT KIDNEY:

Partially visualized and is unremarkable.

PERITONEUM: 

No free fluid is identified within the provided images.
IMPRESSION: 1.
Cirrhotic morphology hepatic parenchyma. 

2.
Hepatic elastography demonstrates cirrhosis.

## 2019-10-17 IMAGING — MG MAMMO SCRN BIL W/CAD TOMO
8 series · 8 of 24 positions shown · non-contrast
Comparison: The present examination has been compared to prior imaging studies.

Images Obtained from [facility]
INDICATION: Screening.
TECHNIQUE: Bilateral 2-D digital screening mammogram was performed followed by 3-D tomosynthesis.  Current study was also evaluated with a computer aided detection (CAD) system.
MAMMOGRAM FINDINGS:
The breasts are almost entirely fatty.
There are benign-type calcifications in both breasts.
No suspicious abnormality is seen in either breast.

[R MLO]
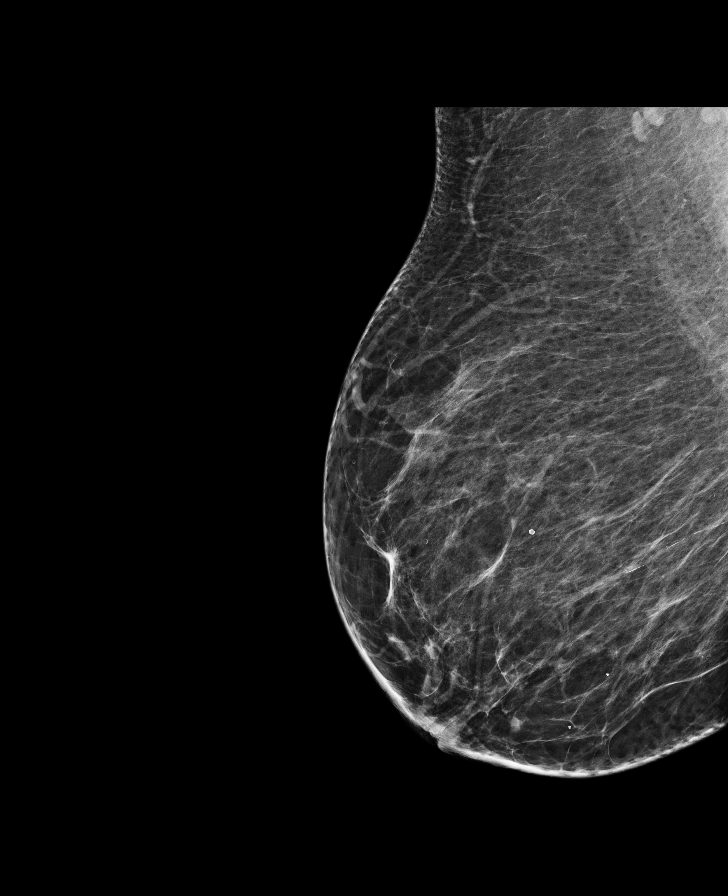

[L MLO]
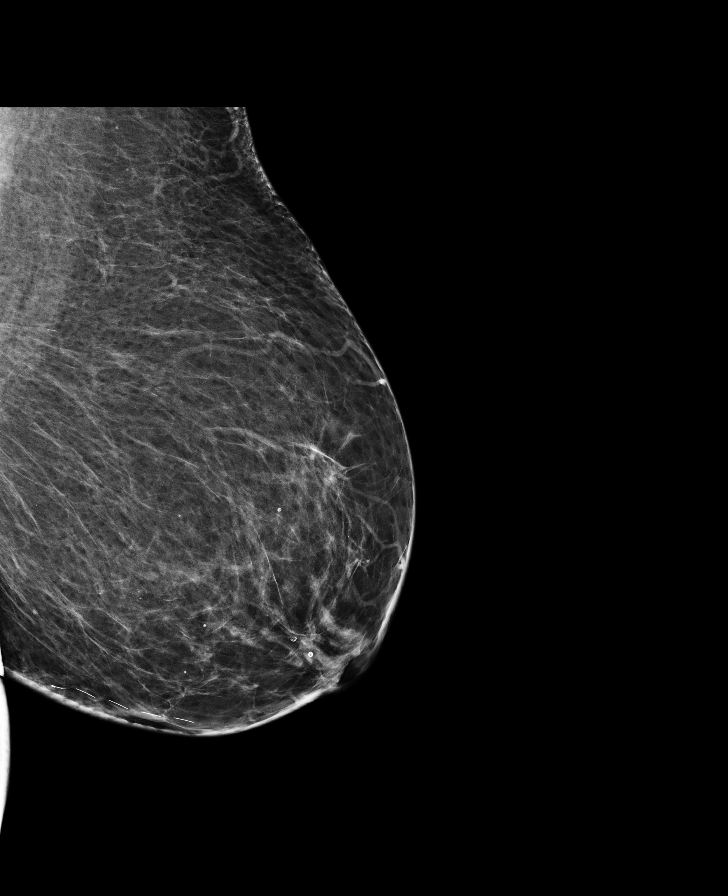

[L CC]
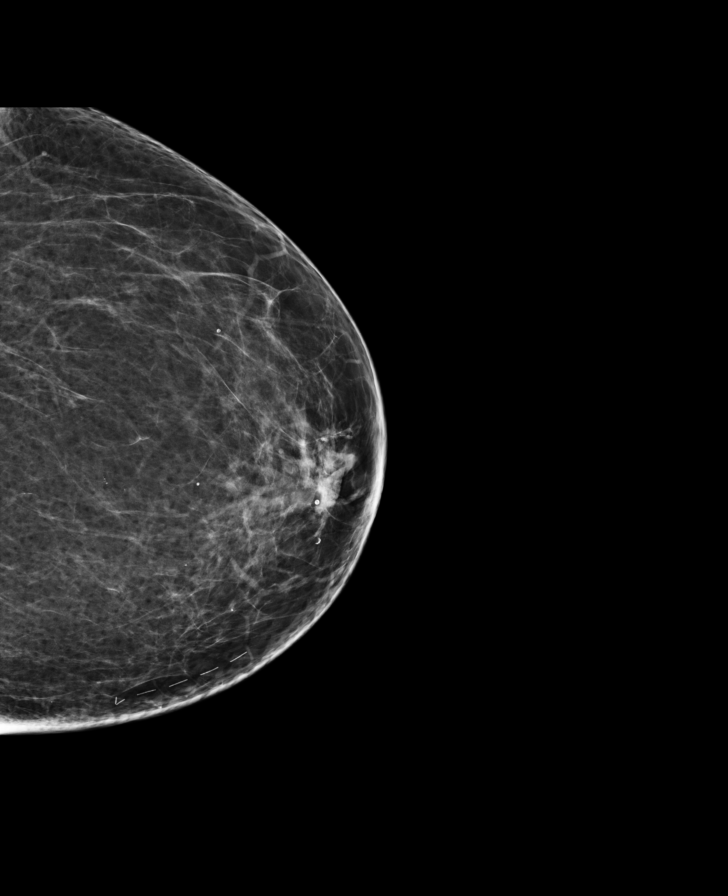

[R CC]
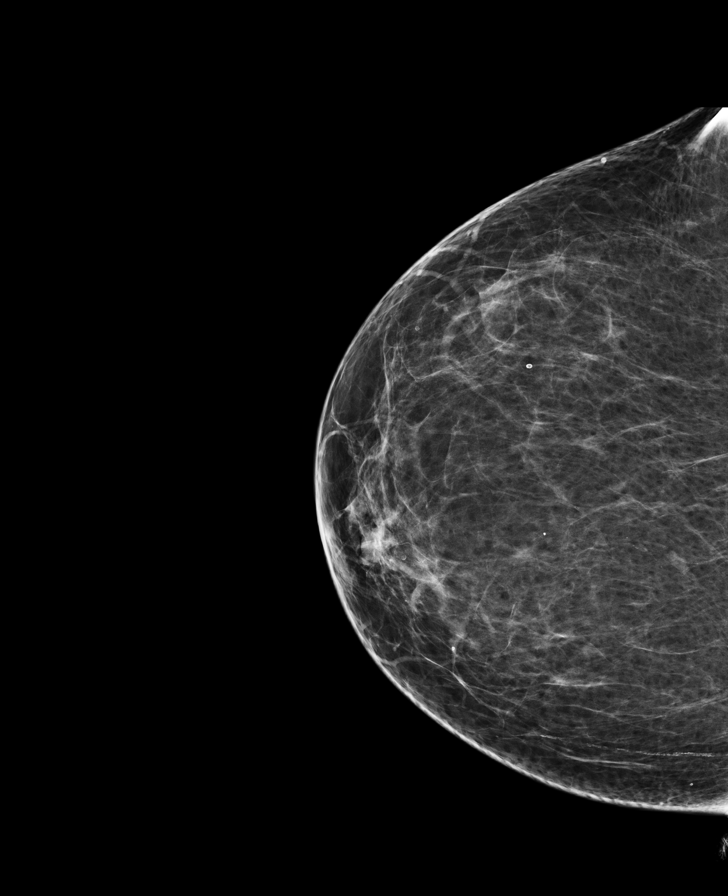

[L MLO tomo · tomo slice 43/85.0]
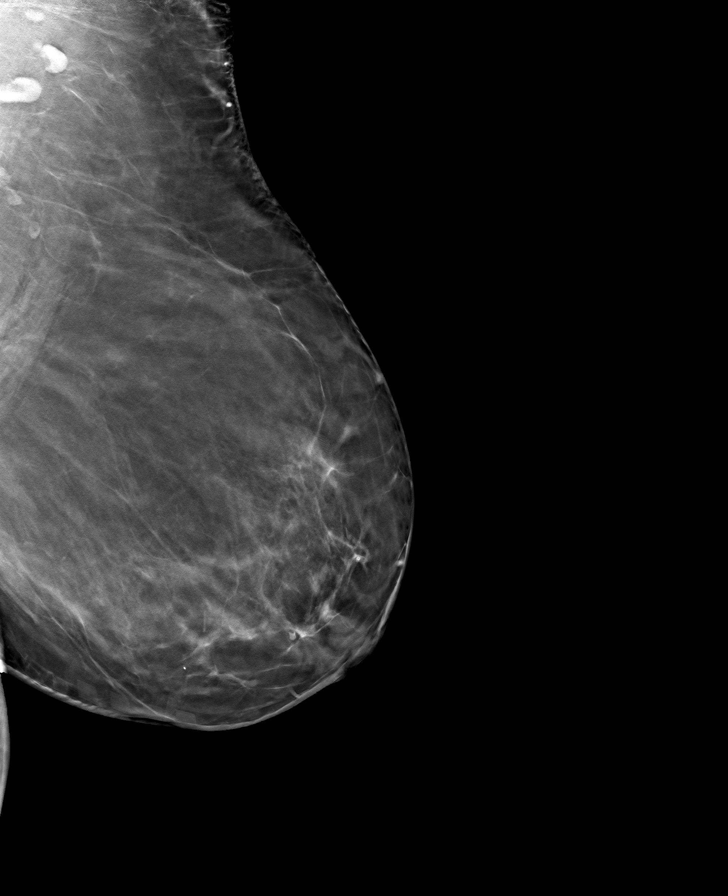

[R CC tomo · tomo slice 37/72.0]
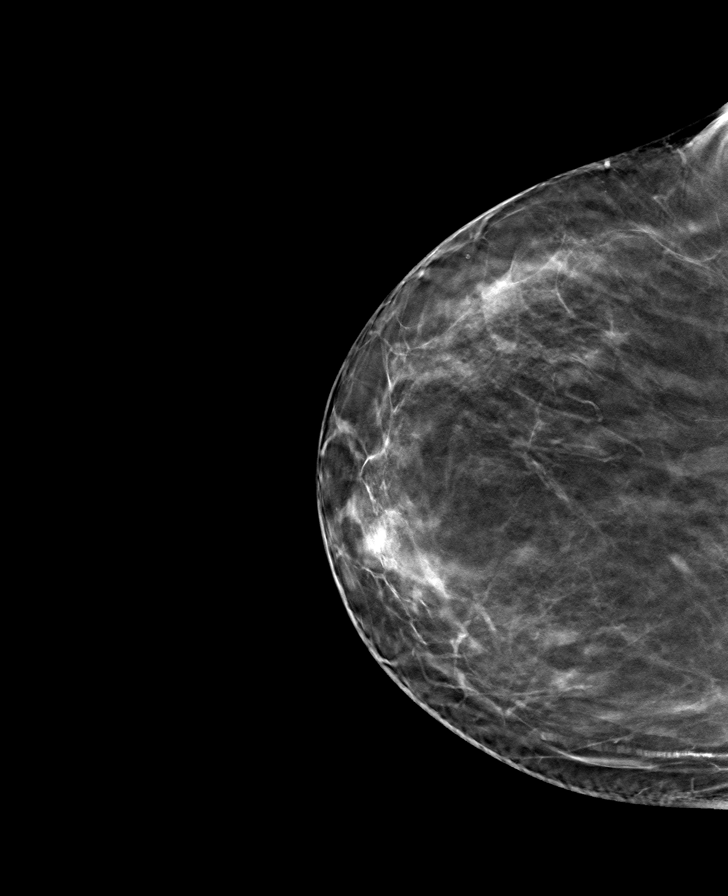

[L CC tomo · tomo slice 37/72.0]
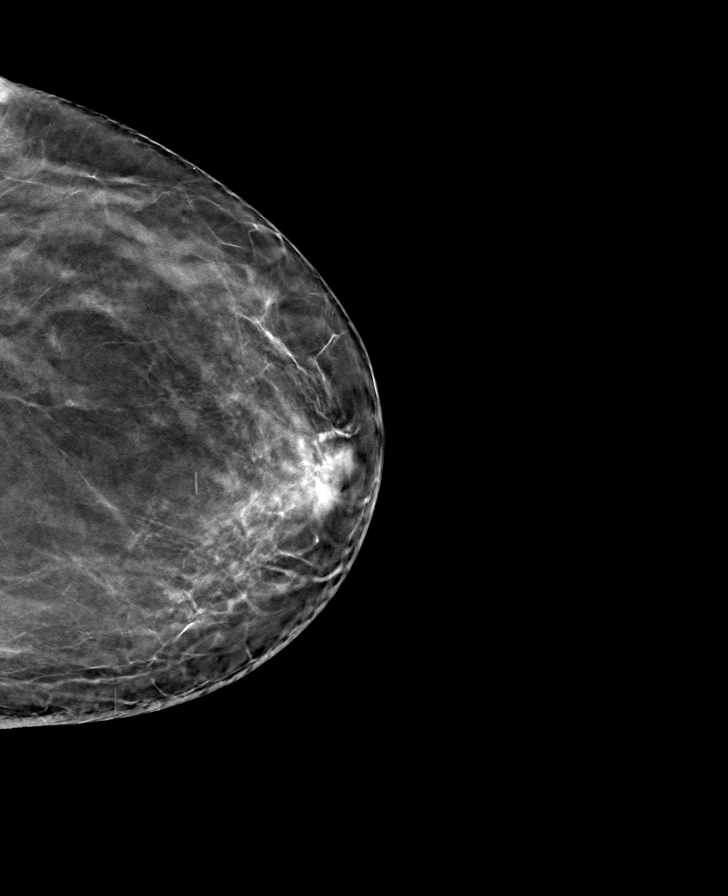

[R MLO tomo · tomo slice 43/85.0]
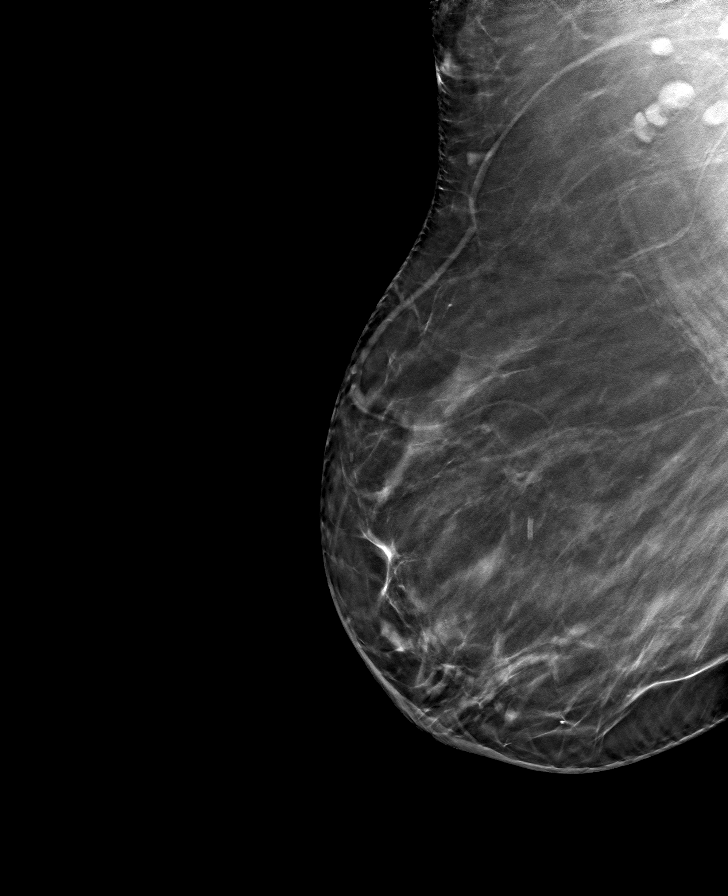

[8 of 24 positions shown; findings below may reference images not displayed]

IMPRESSION: There is no mammographic evidence of malignancy.
Screening mammogram recommended in 1 year.
BI-RADS Category 2: Benign

## 2020-02-16 IMAGING — OT DXA BONE DENSITY
3 series · 3 of 3 positions shown · non-contrast
Comparison: none

REASON FOR EXAM: Postmenopausal osteoporosis screening

RISK FACTORS:  Diabetes (oral meds).
PRIOR EXAMS:  DXA from [HOSPITAL] dated 02/08/2018.
METHOD:  Scans of the spine, hip, and forearm were performed using dual energy X-ray densitometry (DXA) with the [employer] C ([id number]) system.

[Series 1: — · left · 1 of 1 slices shown (1 of 3)]
[im 1/1]
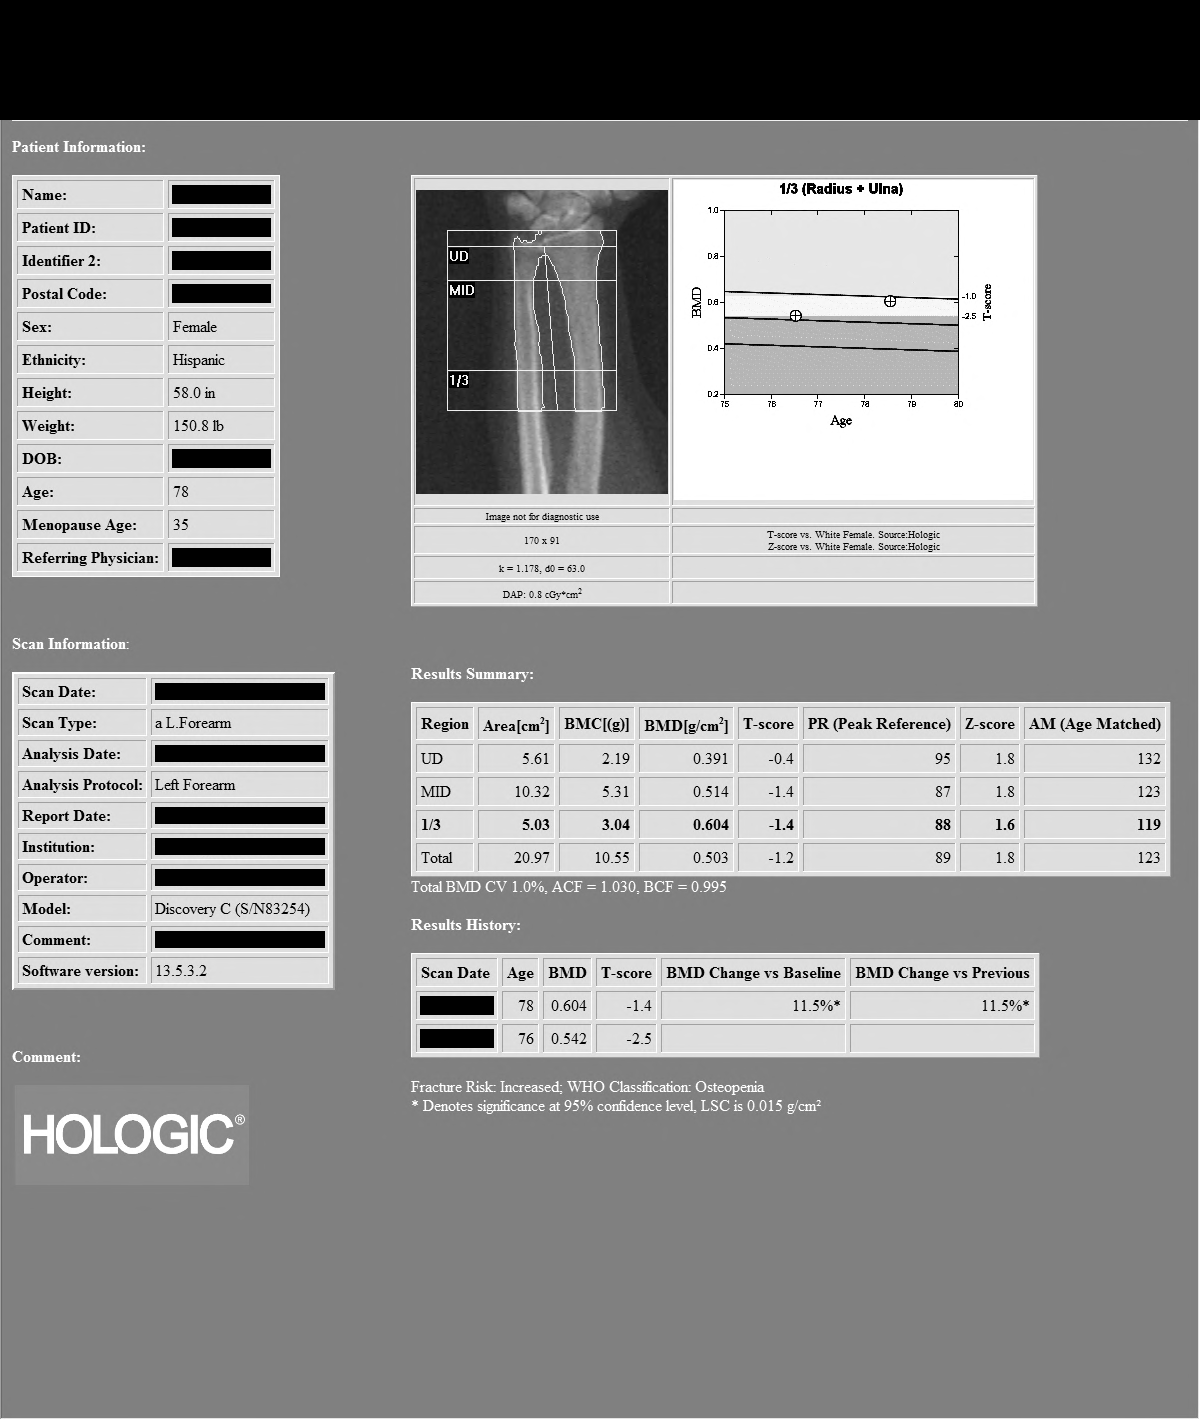

[Series 2: — · 1 of 1 slices shown (2 of 3)]
[im 1/1]
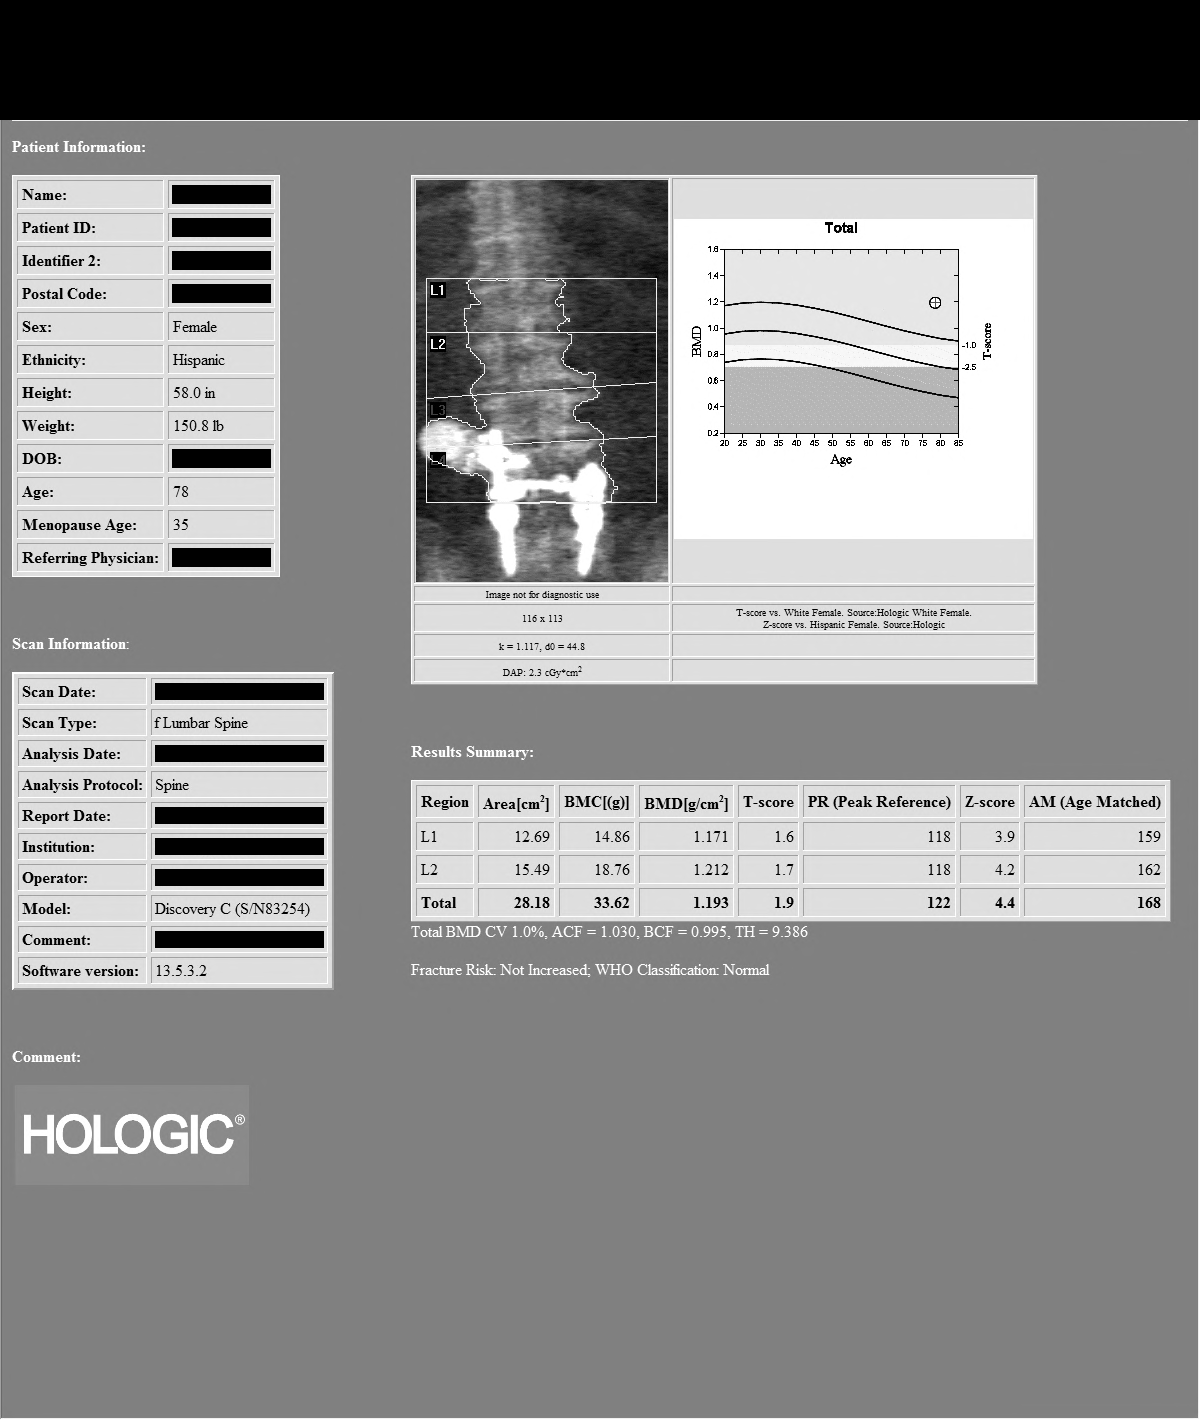

[Series 3: — · left · 1 of 1 slices shown (3 of 3)]
[im 1/1]
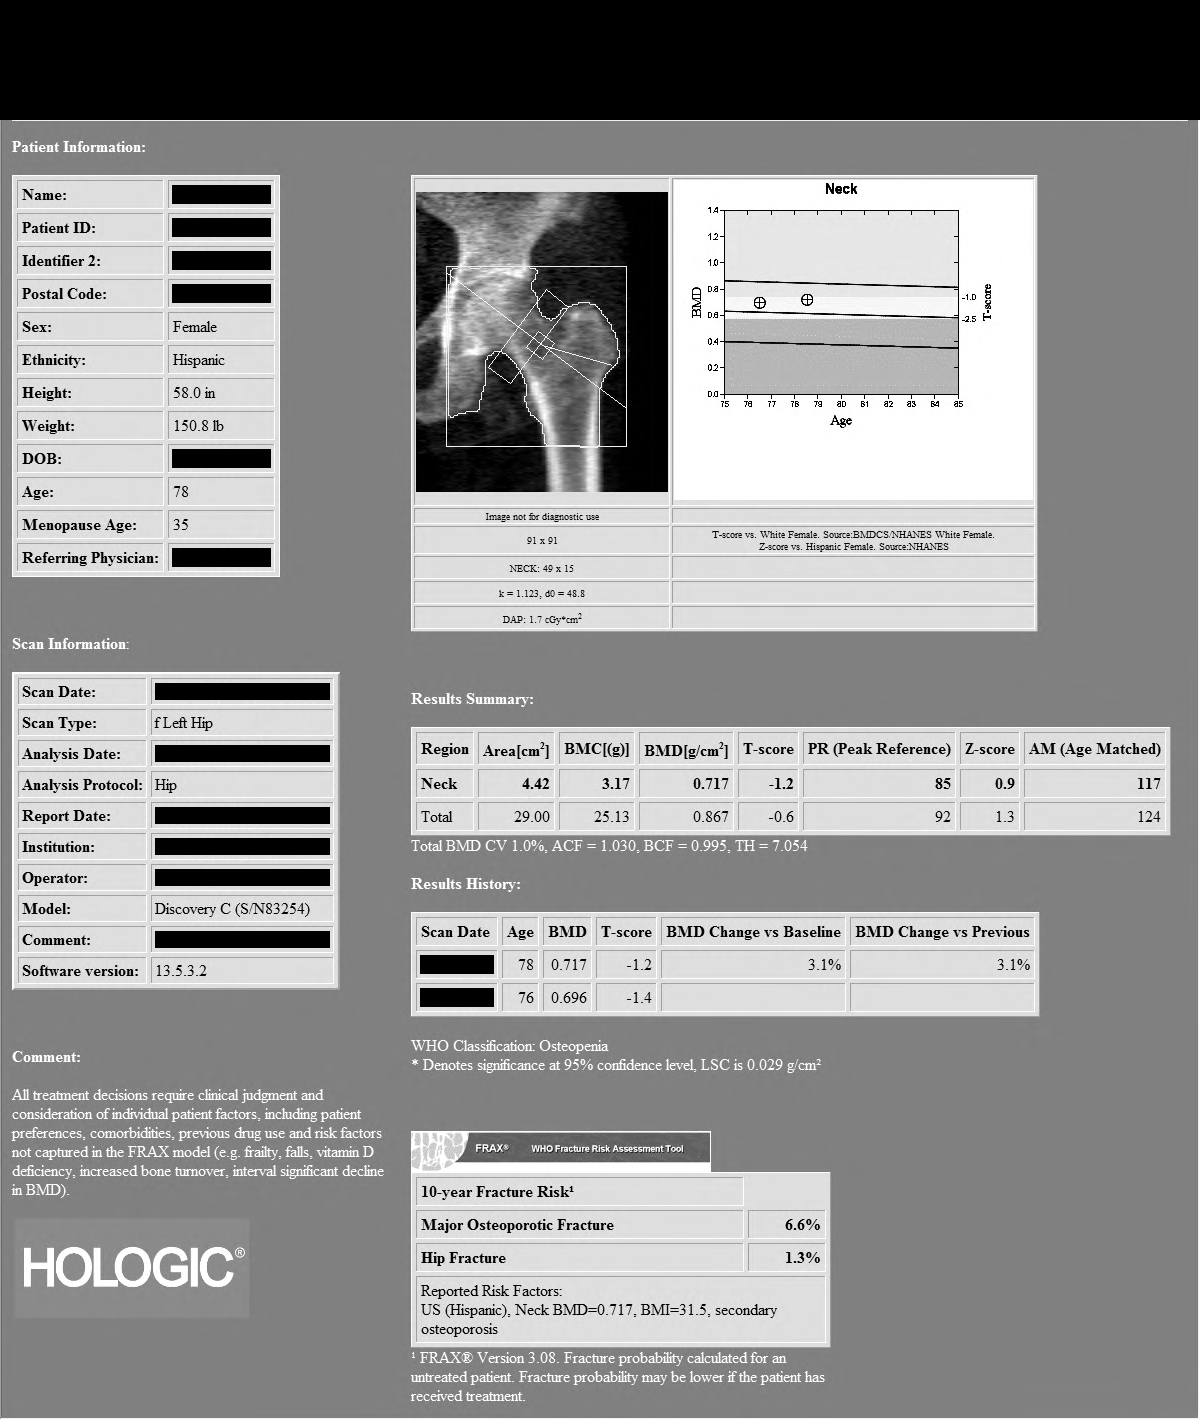

[3 of 3 positions shown; findings below may reference images not displayed]

IMPRESSION: As defined by World Health Organization, the patient meets the criteria for OSTEOPENIA based on hip and forearm T-scores.

PATIENT DEMOGRAPHICS:  78-year-old Hispanic female.
FINDINGS: 1.    Review of scanogram images shows surgical hardware in the lumbar spine because of which the L3 and L4 levels were excluded.  

2.    The lumbar spine exam using L1-L2 regions shows average Bone Mineral Density is 1.193 gm/cm2 of Hydroxyapatite.  The T-score (comparing patient with a young adult group) is 1.9 standard deviations ABOVE mean. The Z-score (comparing patient with an age-matched group) is 4.4 standard deviations ABOVE mean.

3.  The left hip exam using femoral neck region of interest shows average Bone Mineral Density is 0.717 gm/cm2 of Hydroxyapatite. The T-score (comparing patient with a young adult group) is 1.2 standard deviations BELOW mean. The Z-score (comparing patient with an age-matched group) is 0.9 standard deviations ABOVE mean.

COMPARED TO PRIOR DXA, hip bone density was 0.696 gm/cm2.  This is an interval increase of 0.021 gm/cm2 or 3.1 %. Technologist least significant change in the hip is 0.032 gm/cm2. This is not a statistically significant interval increase.

4.   The left forearm exam using [DATE] radius region of interest shows average Bone Mineral Density is 0.604 gm/cm2 of Hydroxyapatite. The T-score (comparing patient with a young adult group) is 1.4 standard deviations BELOW mean. The Z-score (comparing patient with an age-matched group) is 1.6 standard deviations ABOVE mean.

COMPARED TO PRIOR DXA, forearm bone density was 0.542 gm/cm2.  This is an interval increase of 0.062 gm/cm2 or 11.5 %. Least significant change in the forearm is 0.015 gm/cm2.  This is a statistically significant interval increase.

According to the World Health Organization risk assessment tool (FRAX) for osteopenia only, which uses the femoral neck T-score and includes other patient risk factors for fracture, the patient has a 10-year absolute risk of hip fracture of 1.3% and 10-year absolute risk fracture for any major fracture of 6.6%.

RECOMMENDATIONS:  The patient states that she is taking supplements on a regular basis.  The patient should continue being a nonsmoker and  REGULAR EXERCISE TO PATIENT TOLERANCE WOULD BE OF BENEFIT.  The patient is currently not taking prescribed medication for prevention of bone loss.  According to criteria established by the National Osteoporosis Foundation, the patient DOES NOT meet the current indications for prescribed medical therapy.  The National Osteoporosis Foundation now recommends followup DXA scanning every two years in patients at risk regardless of whether the patient is undergoing pharmacological treatment.

World Health Organization criteria for diagnosis, please see link below.

[URL]

## 2020-02-28 IMAGING — US US LIVER
1 series · 14 of 25 positions shown · non-contrast
Comparison: Liver ultrasound with elastography 07/15/2019.

HISTORY: 78-year-old Female with cirrhosis
TECHNIQUE: Gray-scale and color and spectral Doppler ultrasound imaging of the abdomen was performed.

[Series 1: us liver · 14 of 31 slices shown]
[im 1/31]
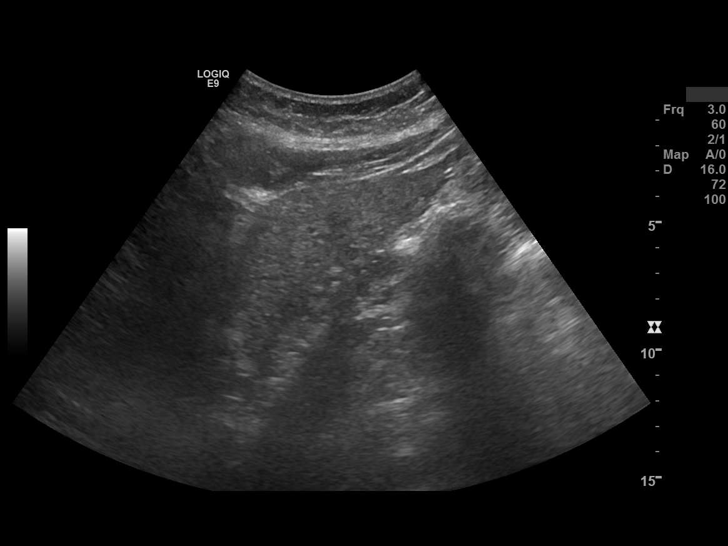
[im 3/31]
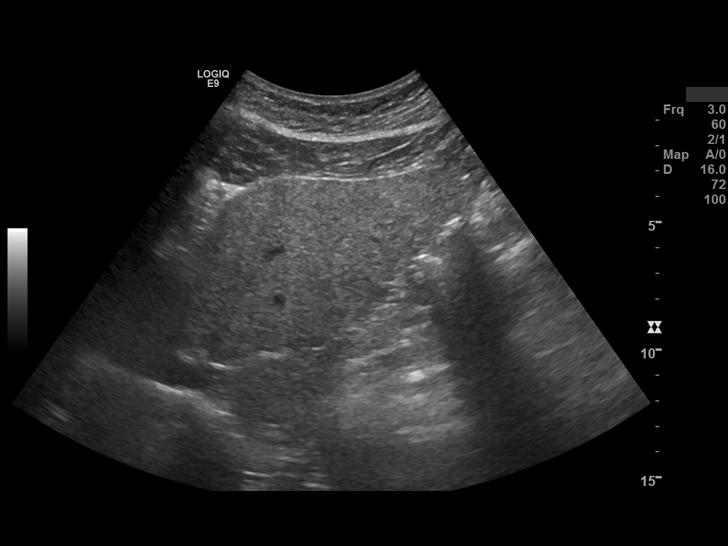
[im 6/31]
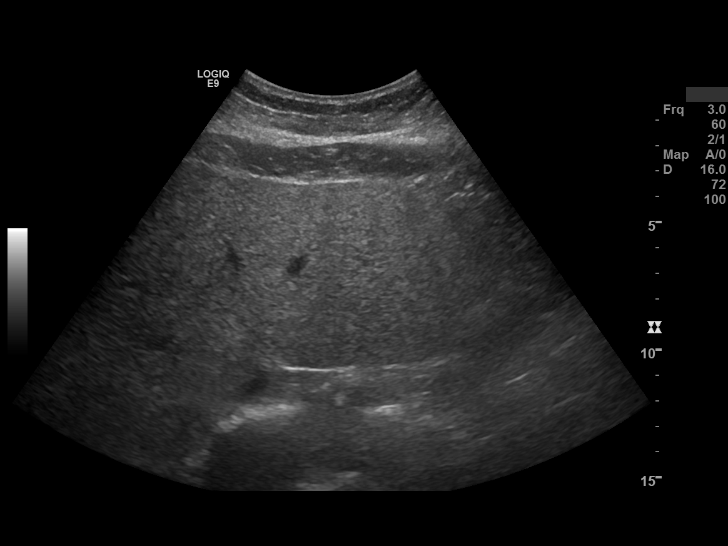
[im 8/31]
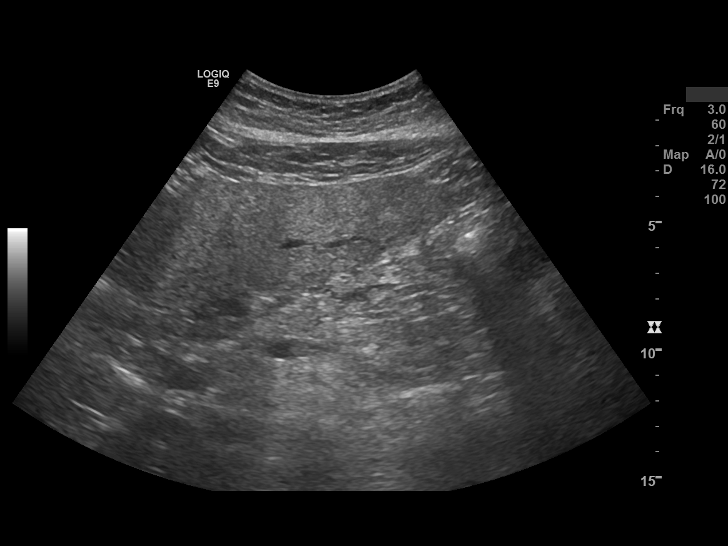
[im 11/31]
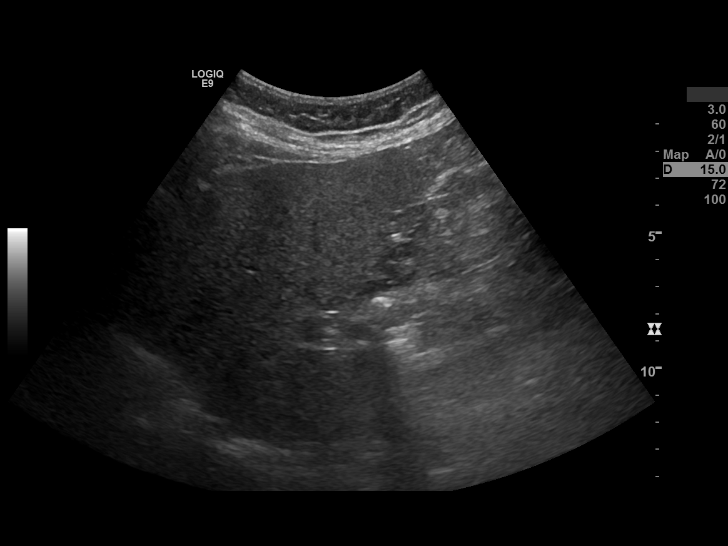
[im 12/31]
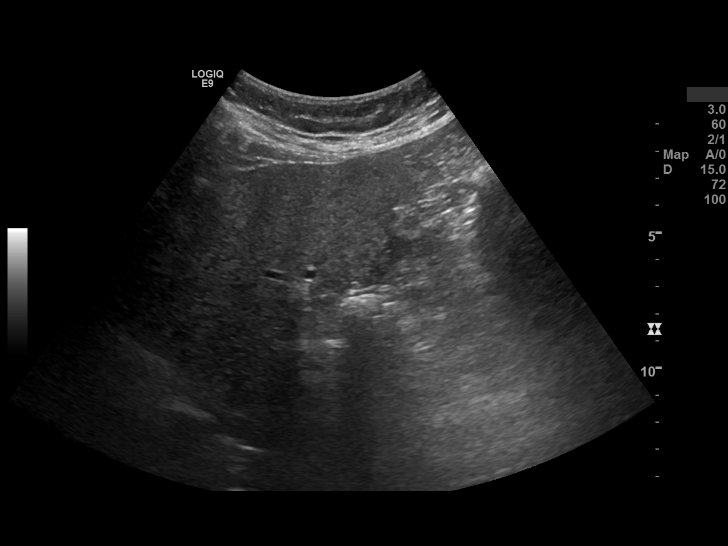
[im 14/31]
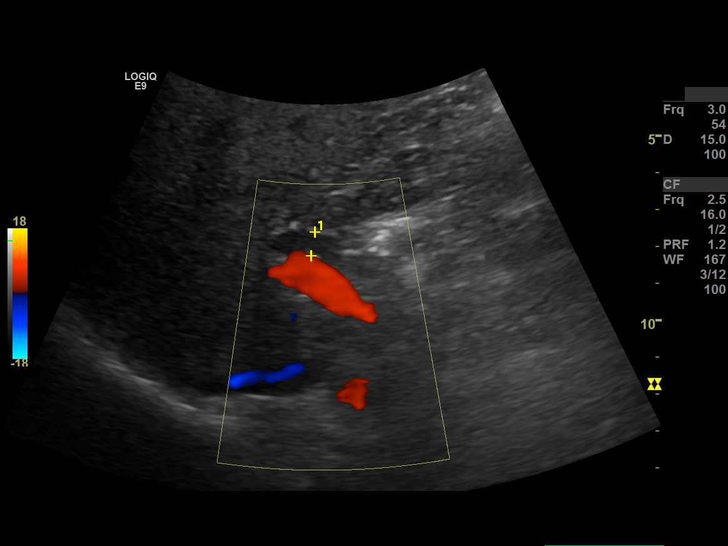
[im 17/31]
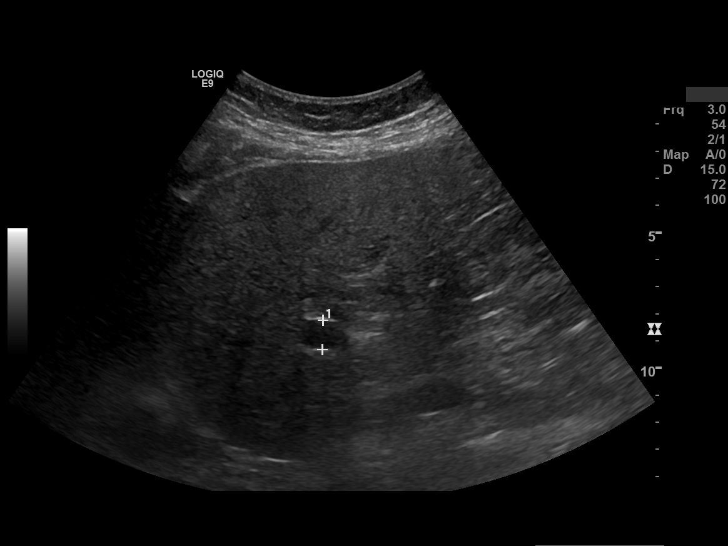
[im 19/31]
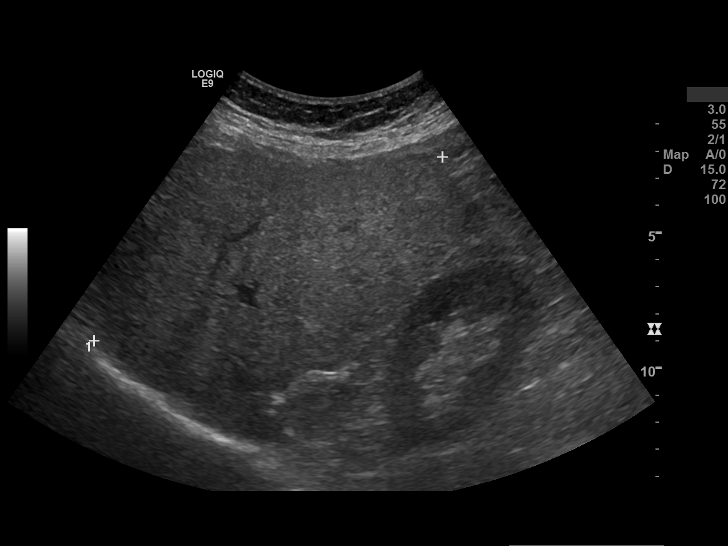
[im 21/31]
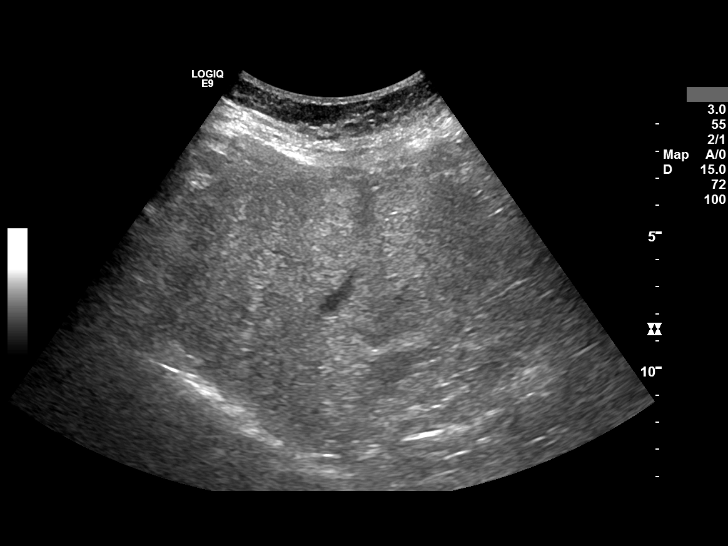
[im 23/31]
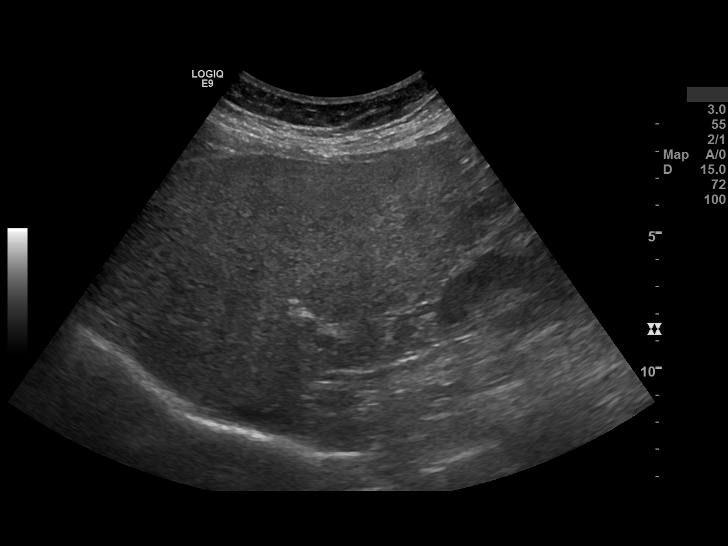
[im 26/31]
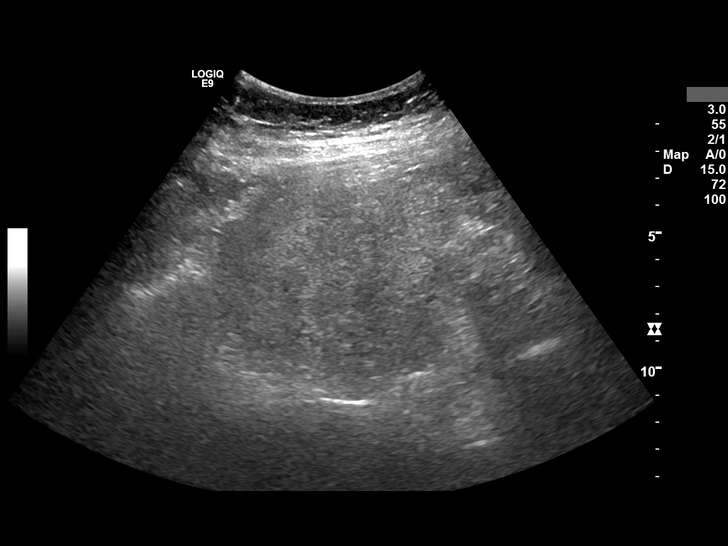
[im 28/31]
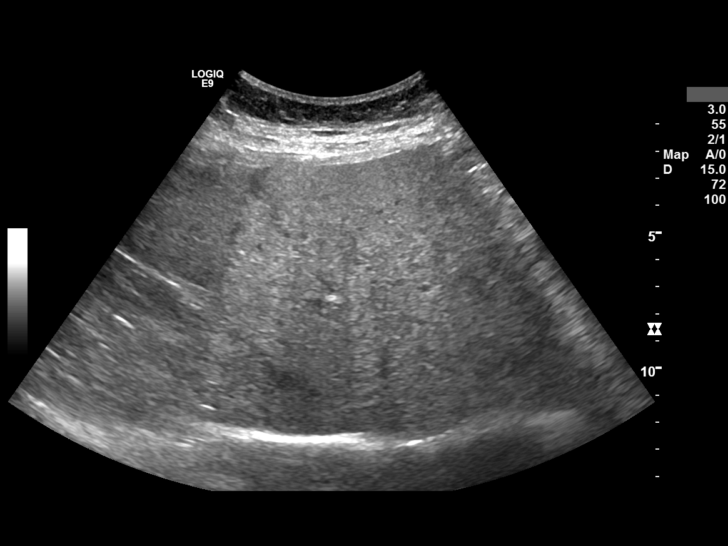
[im 31/31]
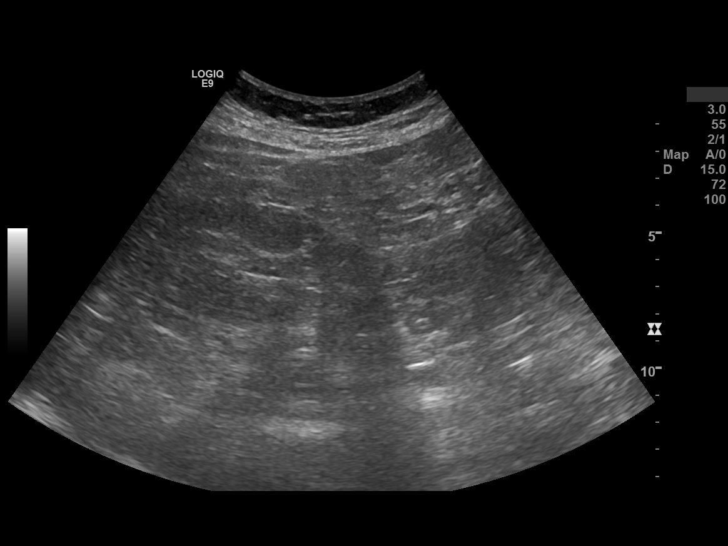

[14 of 25 positions shown; findings below may reference images not displayed]

FINDINGS: Image quality: Satisfactory.

LIVER: 

*
Size: Normal in size, measuring 145 mm in length. 

*
Parenchymal Echogenicity: Increased

*
Parenchymal Echotexture: Coarsened. 

*
Surface: Mildly nodular. 

*
Focal Hepatic Lesions: None.

*
Portal Vein: Patent with appropriate direction of flow and normal spectral waveform.

BILIARY DUCTAL SYSTEM: 

*
Dilation: No intra- or extrahepatic biliary ductal dilation.  

*
Common Bile Duct: Measures 6.52 mm in greatest diameter. 

GALLBLADDER: 

Status post cholecystectomy.

RIGHT KIDNEY:

Partially visualized and is unremarkable.

PERITONEUM: 

No free fluid is identified within the provided images.
IMPRESSION: Cirrhotic morphology of the hepatic parenchyma. No focal hepatic lesions by sonography. Portal vein is patent with normal direction of flow.

## 2020-07-18 IMAGING — CR L-SPINE 4 VWS MIN
1 series · 5 of 5 positions shown · non-contrast
Comparison: Lumbar spine radiographs 04/28/2018

HISTORY: Low back pain
TECHNIQUE: Lumbar spine 5 views

[Series 1: ap · 0.17mm/px · 5 of 5 slices shown]
[im 1/5]
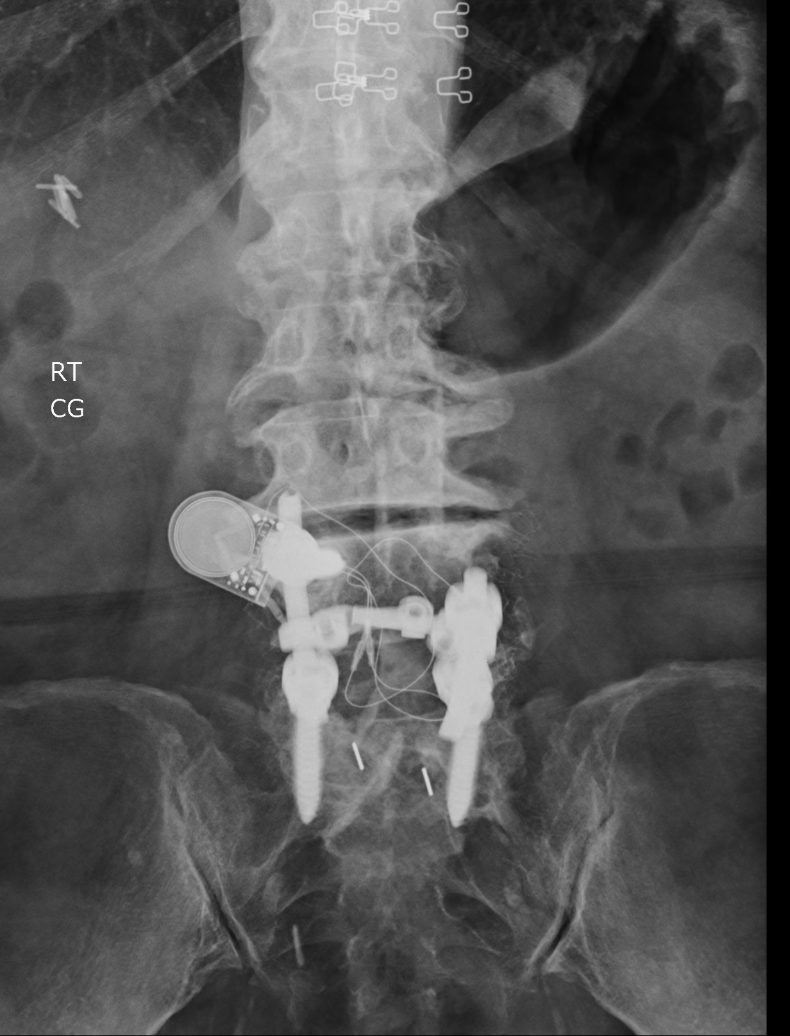
[im 2/5]
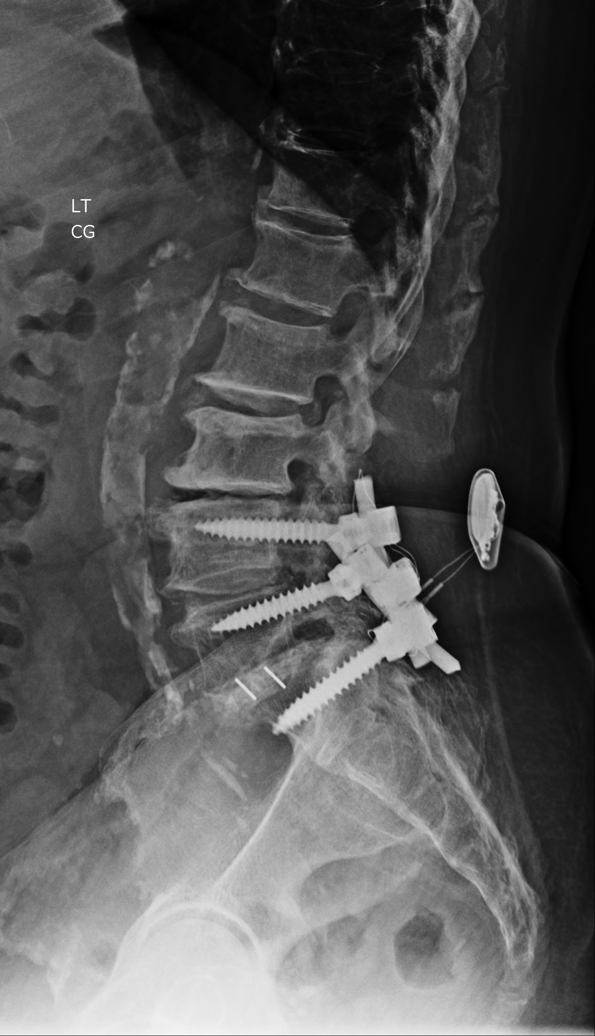
[im 3/5]
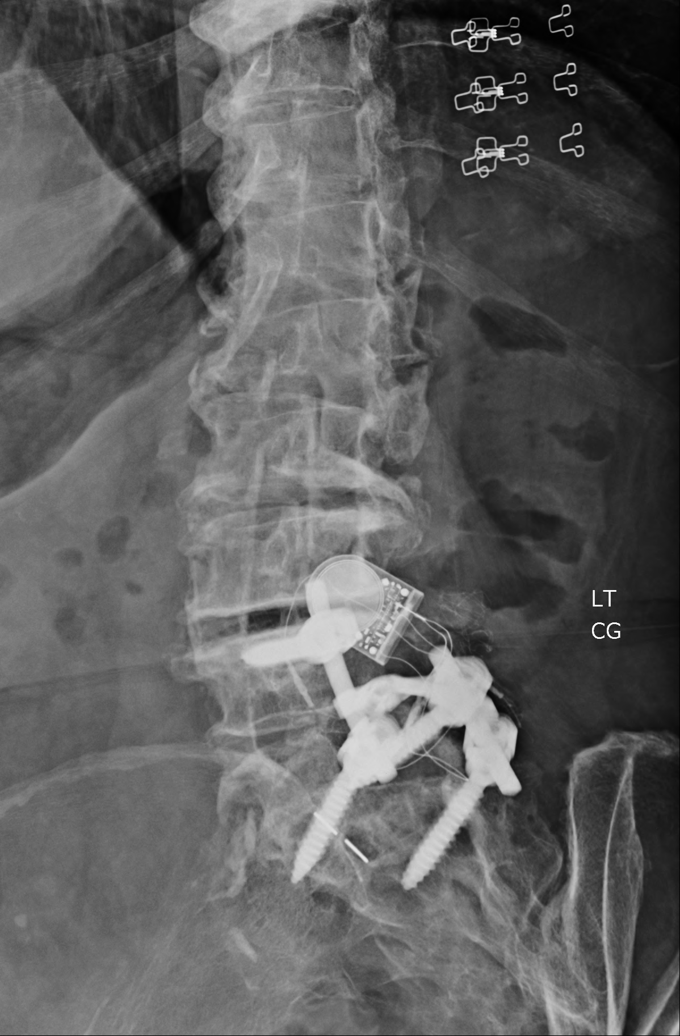
[im 4/5]
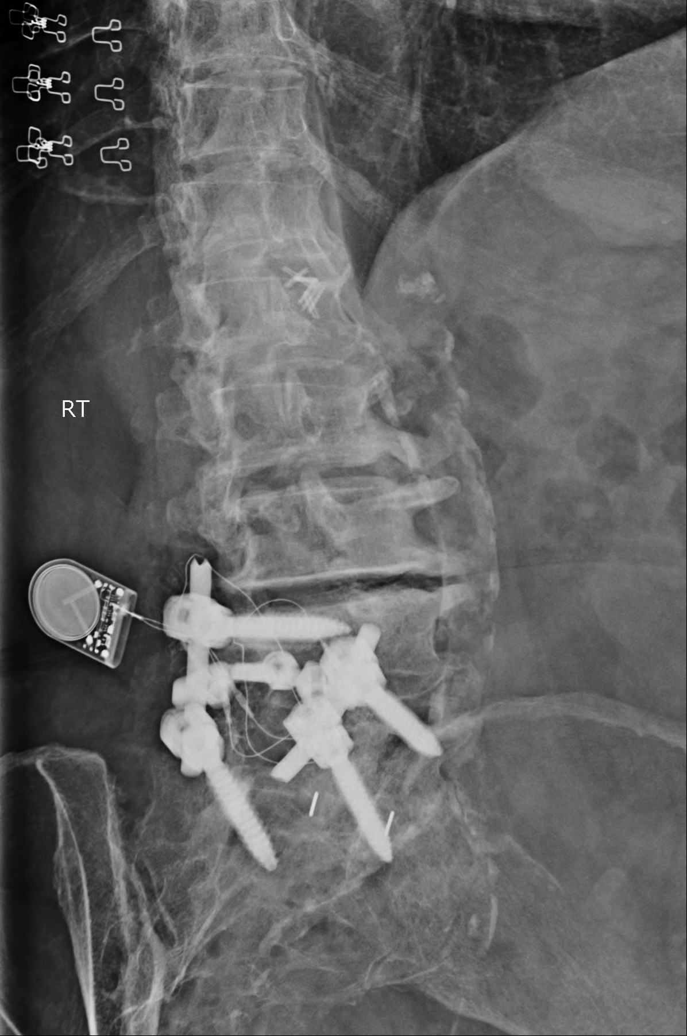
[im 5/5]
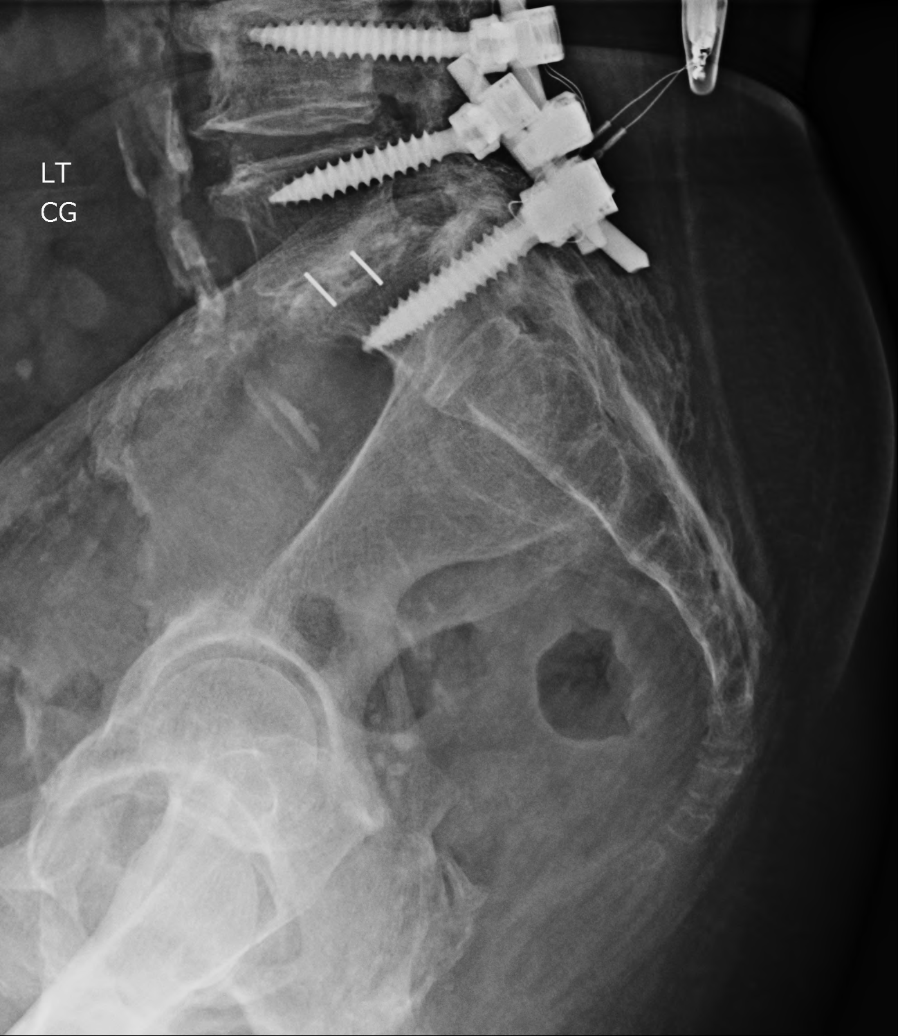

[5 of 5 positions shown; findings below may reference images not displayed]

FINDINGS: 5 view L-spine demonstrates no interval change compared to prior. Stable postsurgical changes of posterior fusion with pedicle screws and connecting rods from L4 to S1. No evidence of loosening. No evidence of hardware failure. Stable appearance of bone growth stimulator. Multilevel degenerative loss of disc space height most advanced at L3-4, similar to prior. Stable grade 1 anterolisthesis L5-S1. Postoperative changes of L5-S1 discectomy, also stable. Multilevel facet arthropathy. Vascular calcifications of the aorta.
IMPRESSION: Stable lumbar spine.

## 2020-07-18 IMAGING — CR KNEE RT 4 VWS MIN
1 series · 4 of 4 positions shown · non-contrast
Comparison: None available.

INDICATION: Right knee pain
TECHNIQUE: 4 views of the right knee are obtained.

[Series 1: ap · 0.17mm/px · 4 of 4 slices shown]
[im 1/4]
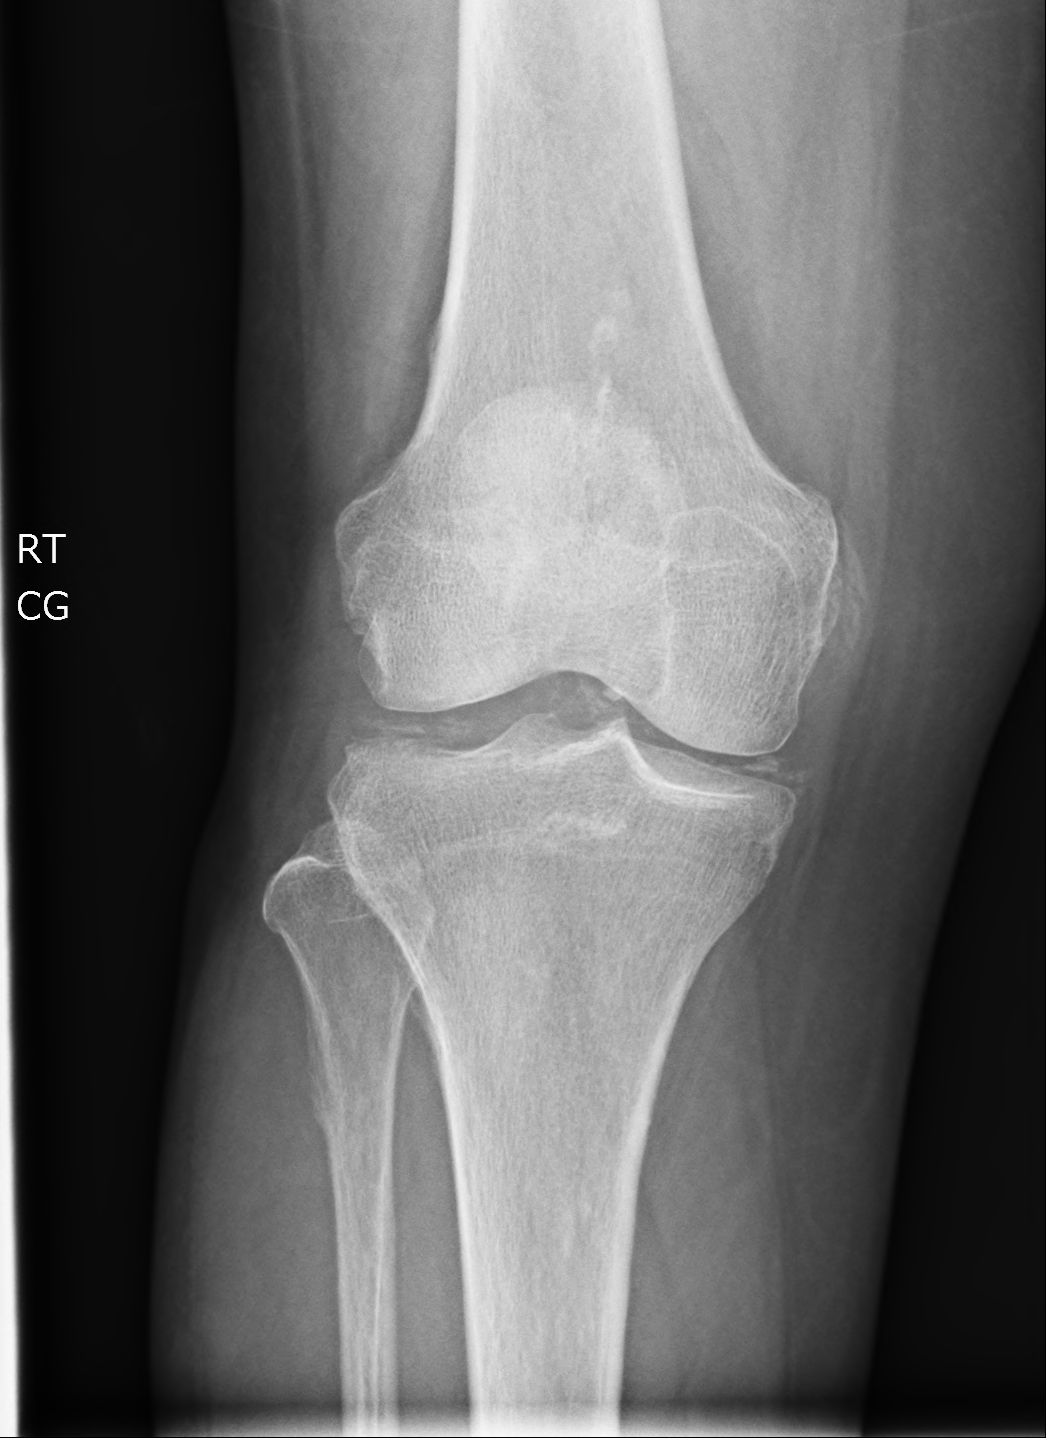
[im 2/4]
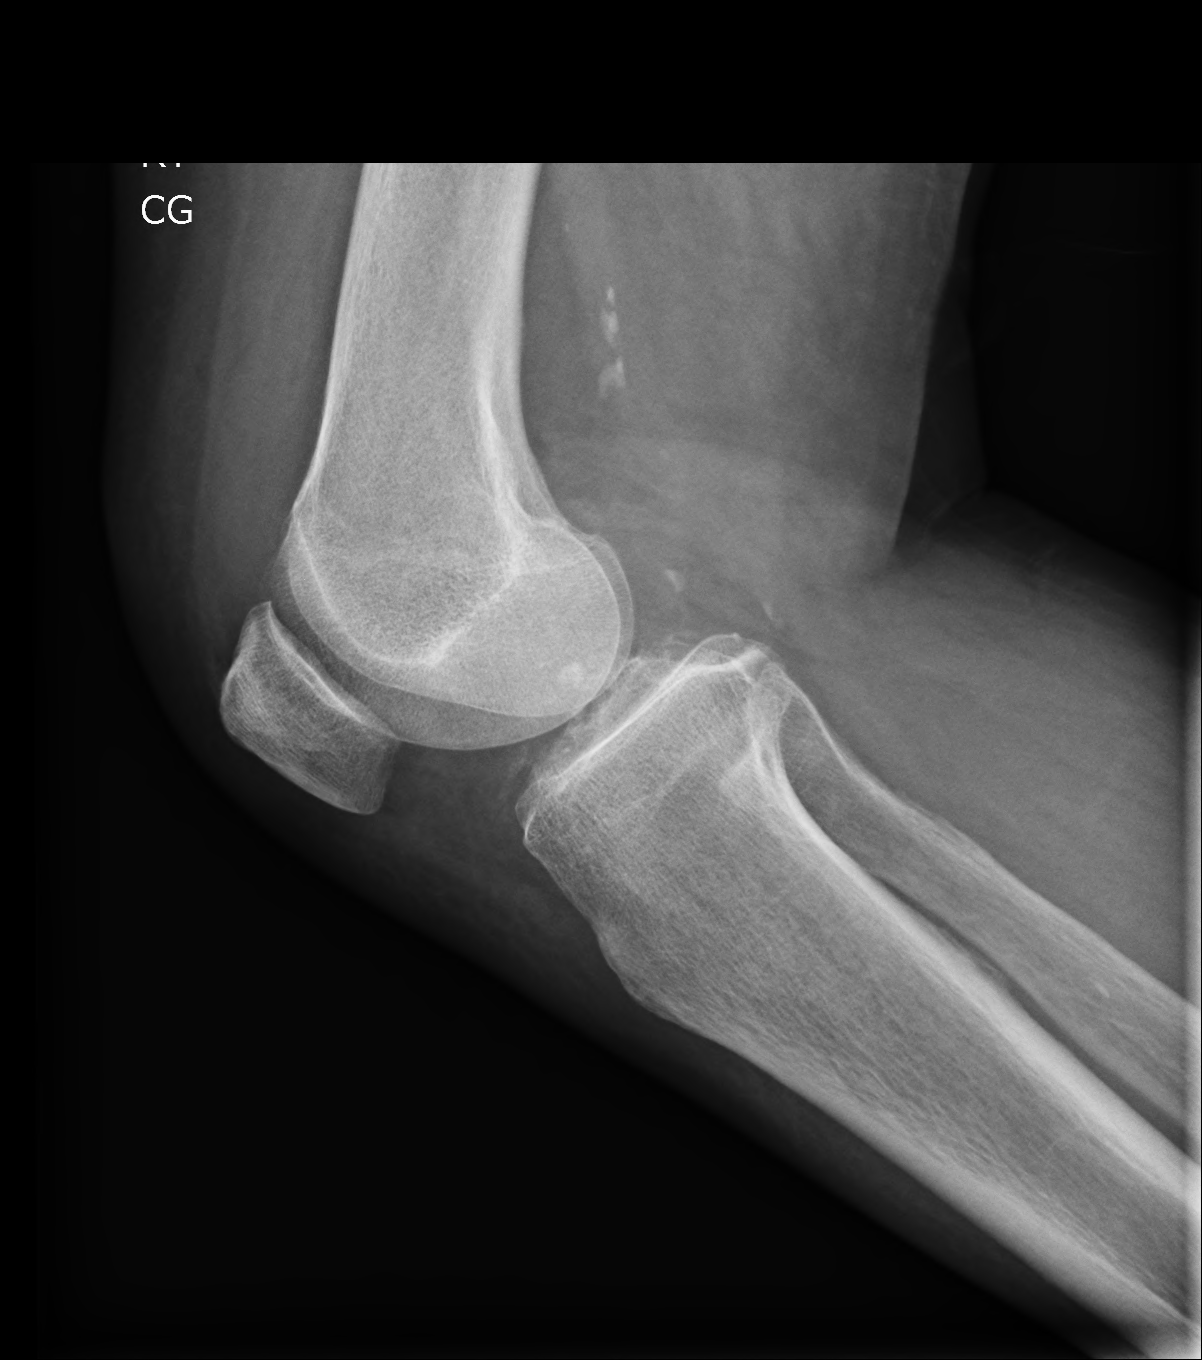
[im 3/4]
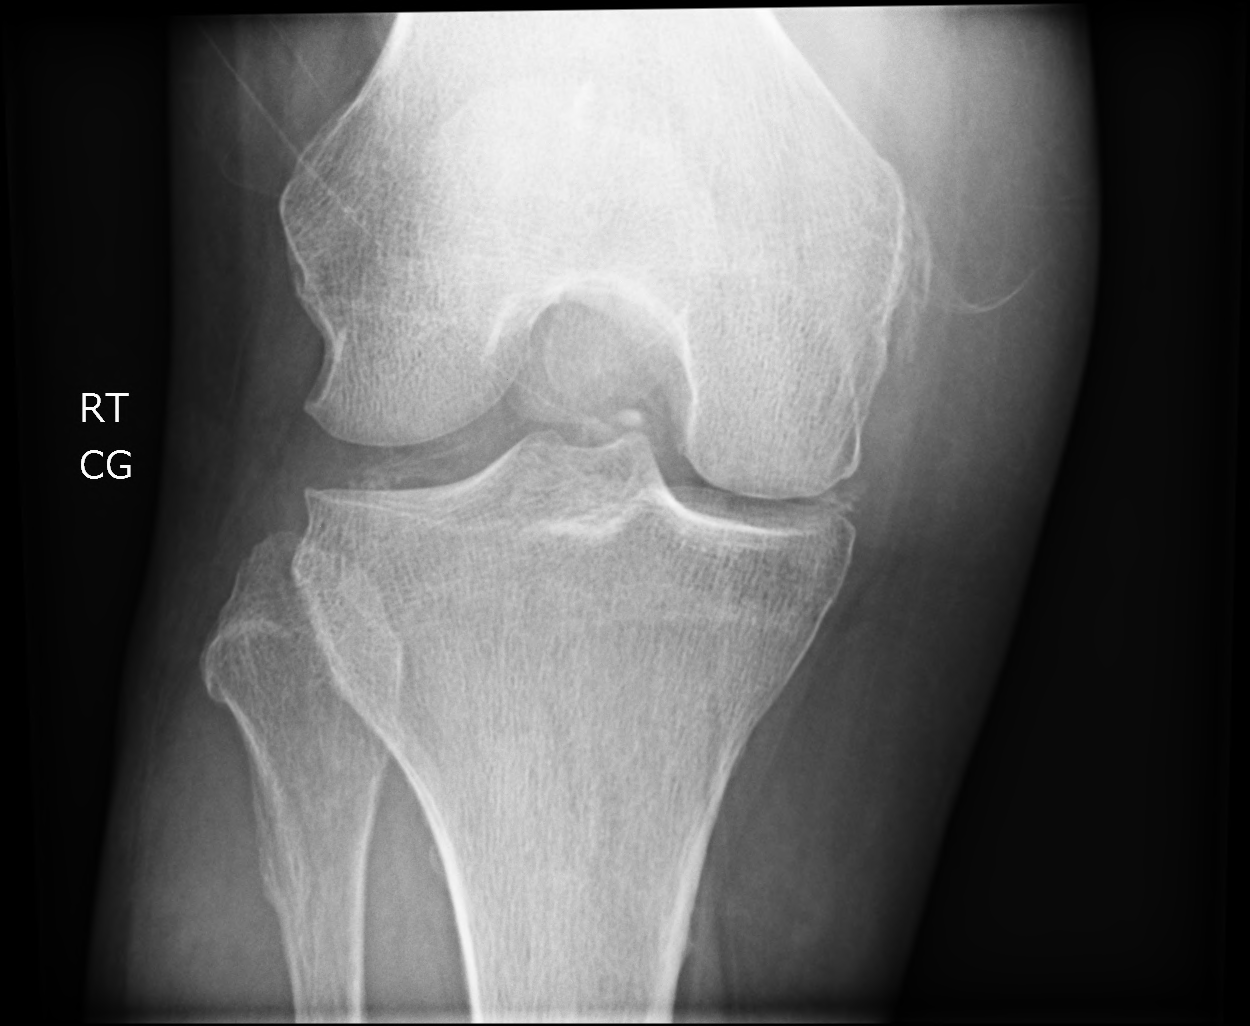
[im 4/4]
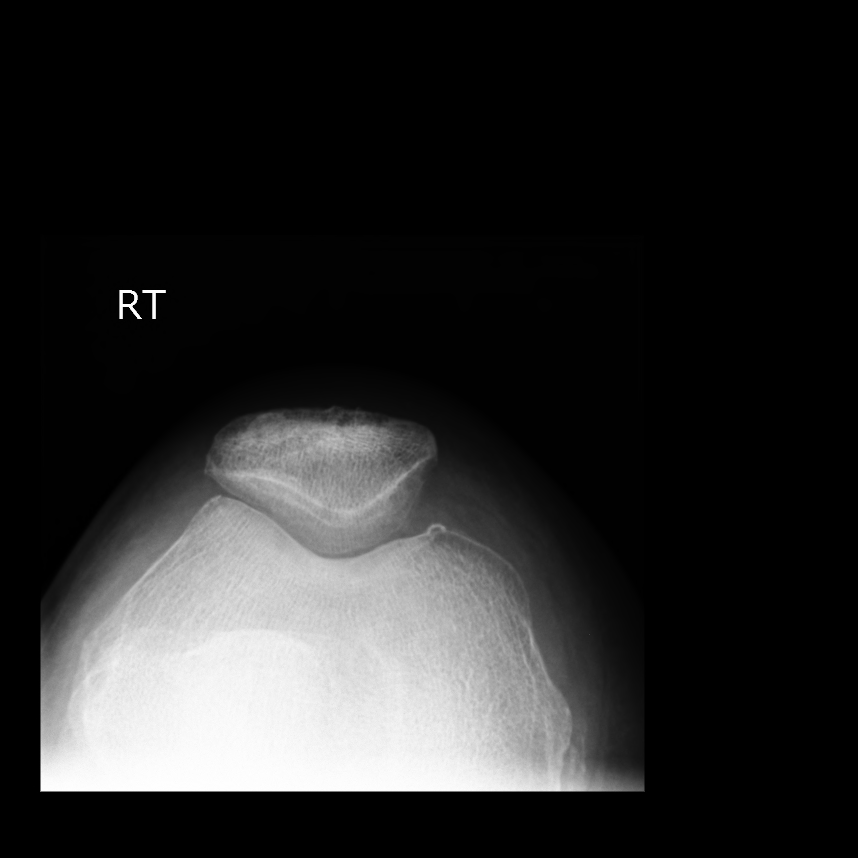

[4 of 4 positions shown; findings below may reference images not displayed]

FINDINGS: No acute fracture or dislocation. Mild medial compartment osteoarthritis. Patellofemoral compartment osteoarthritis. Lateral joint space is maintained. Chondrocalcinosis of the menisci. Faint mineralization is also noted along the medial aspect of the knee along the expected course of the medial collateral ligament. Moderate knee joint effusion.
IMPRESSION: 1.
No acute fracture or dislocation.

2.
Mild medial and patellofemoral compartment osteoarthritis.

3.
Moderate knee joint effusion. Consider further evaluation noncontrast MRI to evaluate for internal derangement.

4.
Chondrocalcinosis.

## 2020-07-27 IMAGING — MR MRI KNEE RT WO CONTRAST
5 series · 40 of 40 positions shown · IV contrast (gadolinium)
Comparison: Right knee radiographs 07/18/2020.

HISTORY: Right knee pain and effusion.
TECHNIQUE: Multiplanar and multisequence MR imaging of the right knee was performed without the administration of intravenous gadolinium.

[Series 4: t2_axial_fs · axial · 4.0mm · 0.44mm/px · z∈[-71,+43]mm · 8 of 24 slices shown]
[im 1/24]
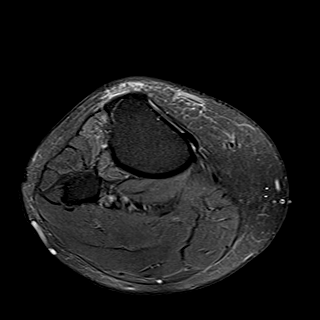
[im 4/24]
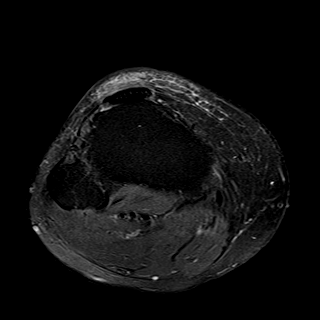
[im 7/24]
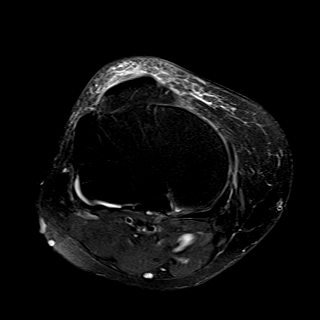
[im 10/24]
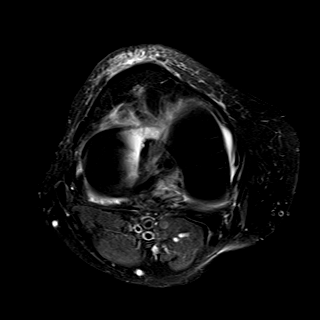
[im 14/24]
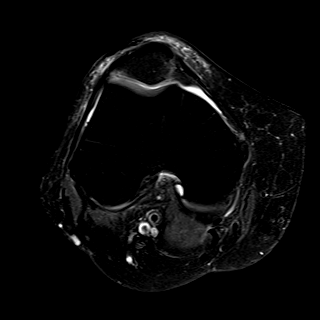
[im 17/24]
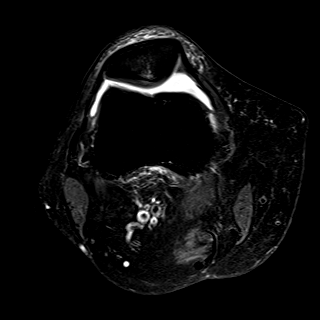
[im 20/24]
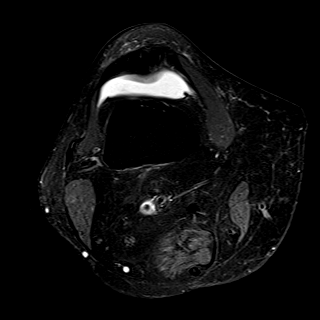
[im 24/24]
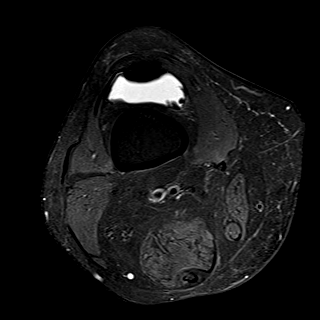

[Series 6: pd_sag_fs · sagittal · 3.0mm · 0.57mm/px · 9 of 24 slices shown]
[im 1/24]
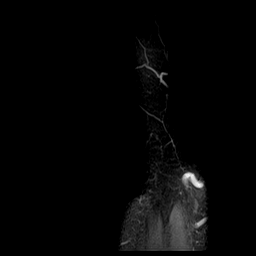
[im 3/24]
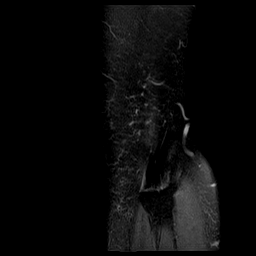
[im 6/24]
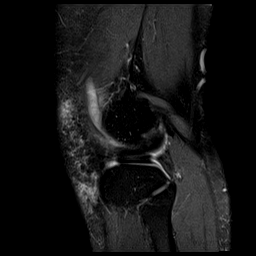
[im 9/24]
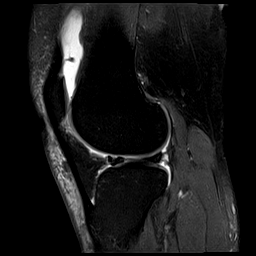
[im 12/24]
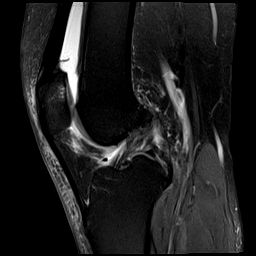
[im 15/24]
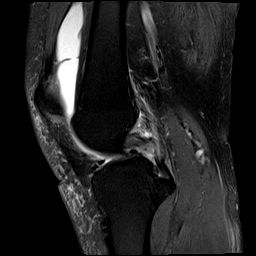
[im 18/24]
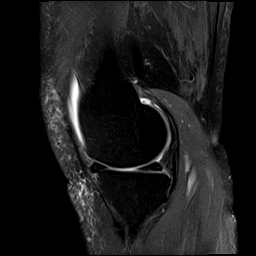
[im 21/24]
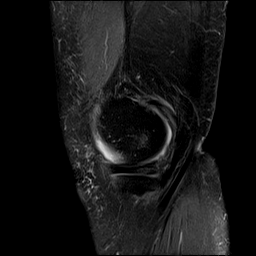
[im 24/24]
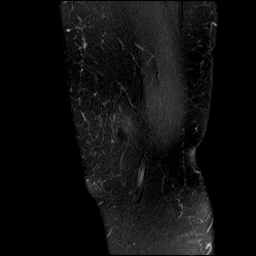

[Series 7: t2_sag_fs · sagittal · 3.0mm · 0.57mm/px · 9 of 24 slices shown]
[im 1/24]
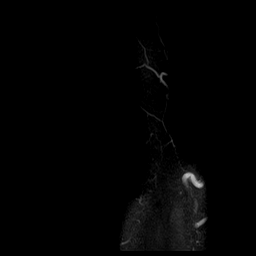
[im 3/24]
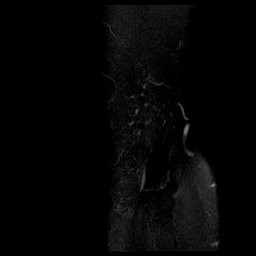
[im 6/24]
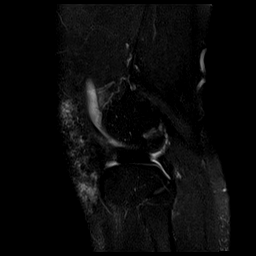
[im 9/24]
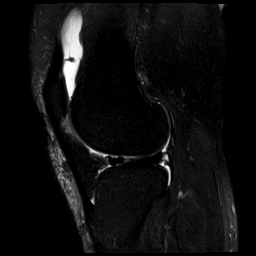
[im 12/24]
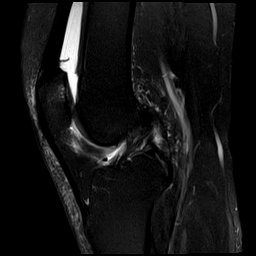
[im 15/24]
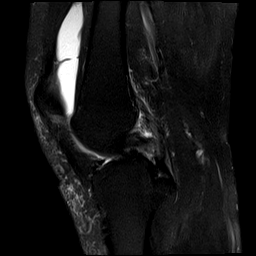
[im 18/24]
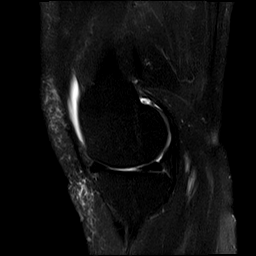
[im 21/24]
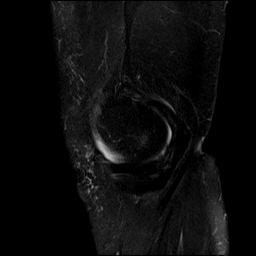
[im 24/24]
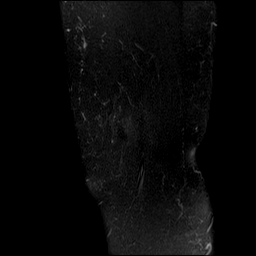

[Series 8: t1_cor · coronal · 4.0mm · 0.44mm/px · 7 of 20 slices shown]
[im 1/20]
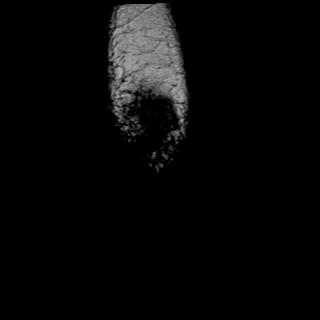
[im 4/20]
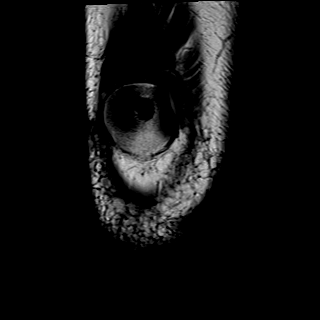
[im 7/20]
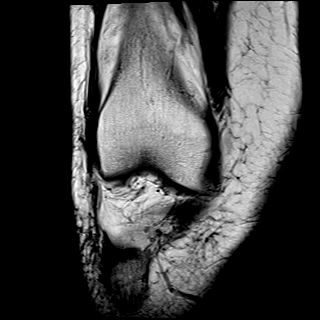
[im 10/20]
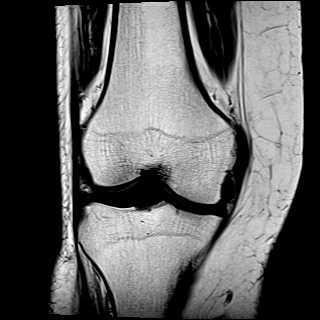
[im 13/20]
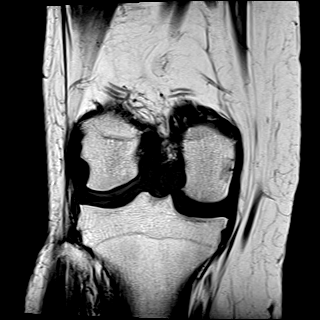
[im 16/20]
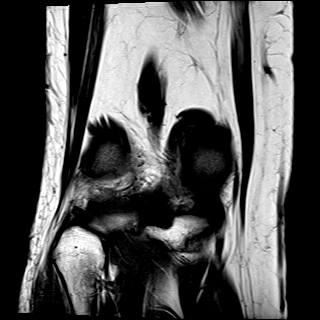
[im 20/20]
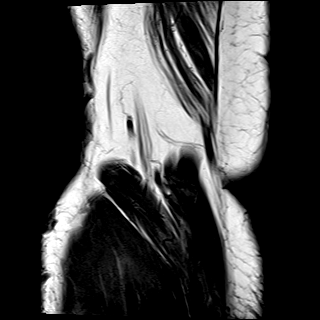

[Series 9: t2_cor_fs · coronal · 4.0mm · 0.44mm/px · 7 of 20 slices shown]
[im 1/20]
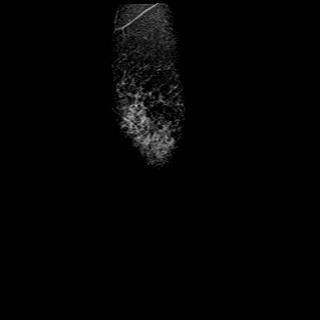
[im 4/20]
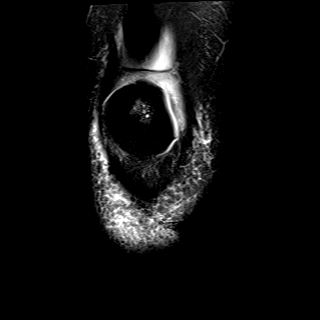
[im 7/20]
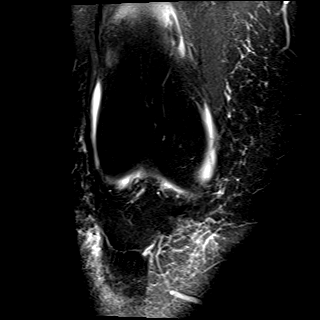
[im 10/20]
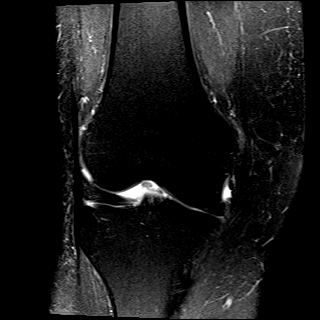
[im 13/20]
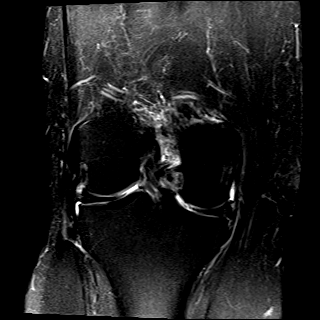
[im 16/20]
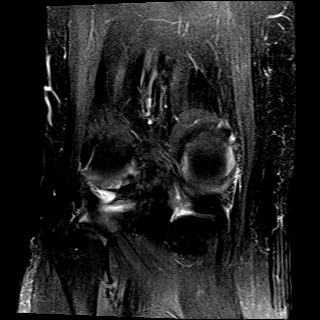
[im 20/20]
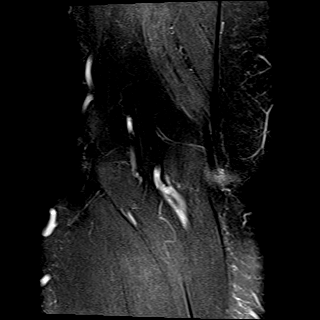

[40 of 40 positions shown; findings below may reference images not displayed]

FINDINGS: Anterior cruciate ligament intact and unremarkable. Low to intermediate-grade, partial-thickness, midsubstance tear of the posterior cruciate ligament, likely chronic.

Medial and lateral collateral ligaments are intact.

Intrasubstance degeneration, posterior horn of medial meniscus. There is a superimposed small oblique undersurface tear at the junction of the posterior horn and body. Intrasubstance degeneration, anterior horn of lateral meniscus without convincing evidence of tear.

Moderate effusion.

Moderate chondral surface irregularity, patellofemoral compartment. Reactive subcortical marrow edema present along the median ridge of the patella.

Mild to moderate DJD, medial compartment. Lateral compartment articular surfaces appear adequately maintained.

Osseous structures demonstrate no fractures or destructive lesions.

Extensor mechanism intact. Patellar retinacula are unremarkable. Signal from muscle normal. Muscle mass normal. No cystic or solid lesions. No visible adenopathy.

Intra-articular fat pads are normal.
IMPRESSION: 1. Moderate DJD, patellofemoral compartment. Mild to moderate DJD, medial compartment.

2. Intrasubstance degeneration, posterior horn of medial meniscus. There is a small superimposed oblique undersurface tear at the junction of the posterior horn and body.

3. Moderate effusion.

4. Intermediate-grade, partial-thickness, mid substance tear, posterior cruciate ligament.

5. No fractures or destructive osseous lesions.

## 2020-11-08 IMAGING — MG MAMMO SCRN BIL W/CAD TOMO
8 series · 8 of 24 positions shown · non-contrast
Comparison: The present examination has been compared to prior imaging studies.

Images Obtained from [facility]
INDICATION: Screening.
TECHNIQUE: Bilateral 2-D digital screening mammogram was performed followed by 3-D tomosynthesis.  Current study was also evaluated with a computer aided detection (CAD) system.
MAMMOGRAM FINDINGS:
The breasts are almost entirely fatty.
No suspicious abnormality is seen in either breast.  There are no significant changes from the prior study.

[R CC]
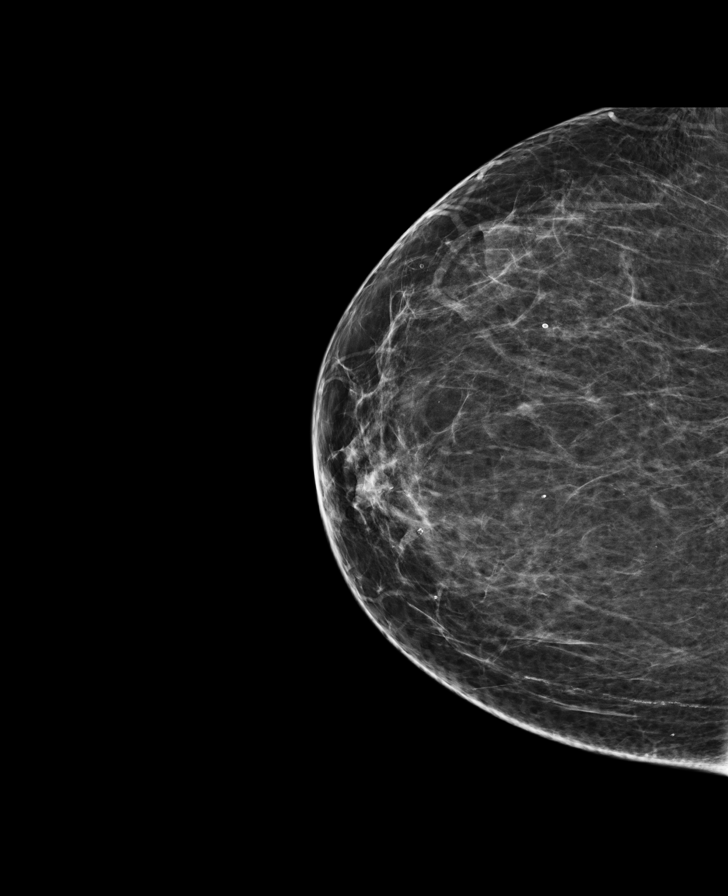

[L MLO]
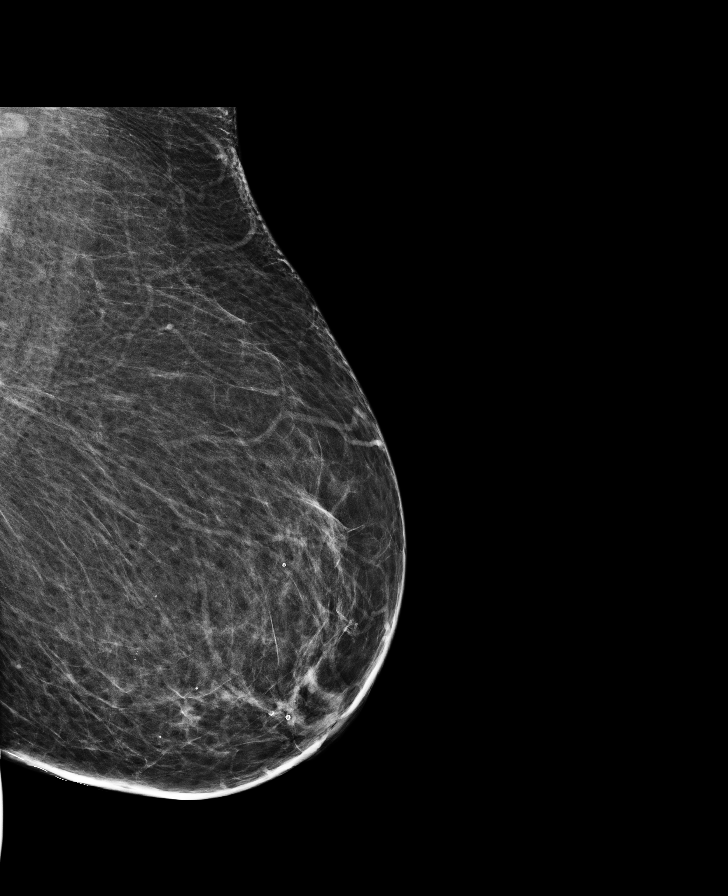

[R MLO]
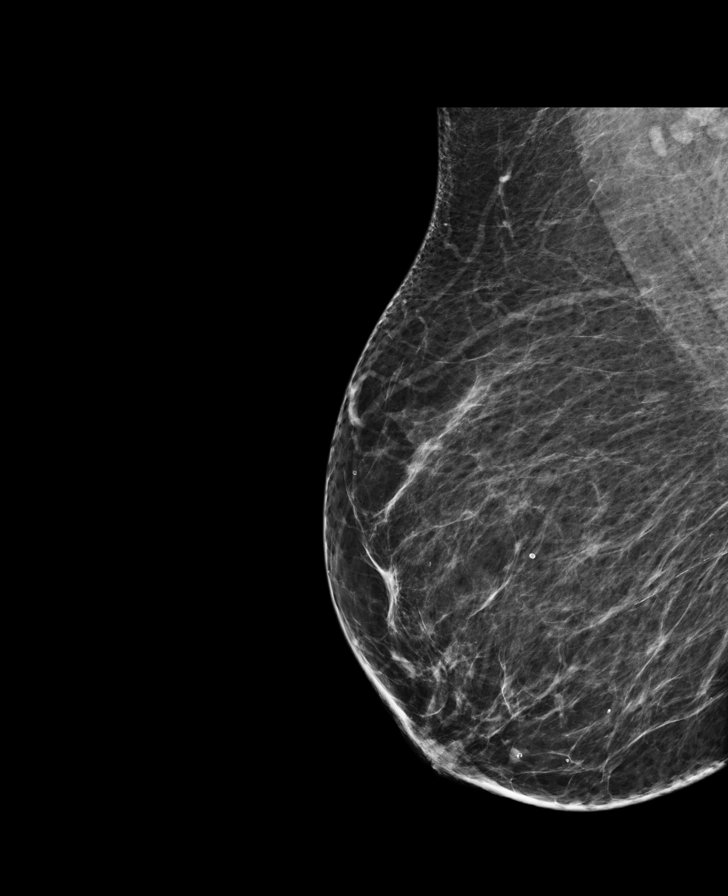

[L CC]
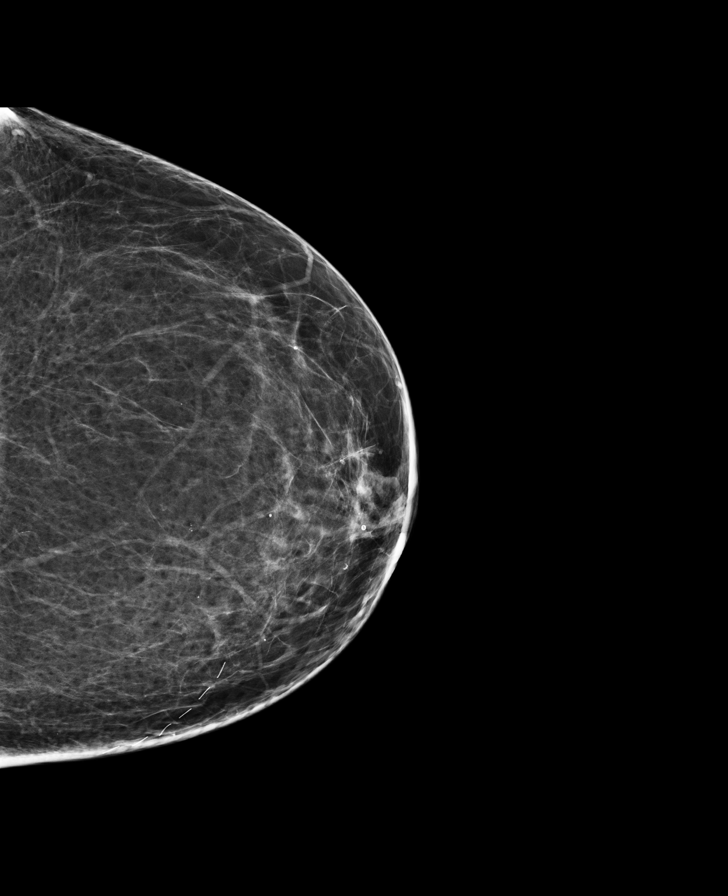

[R MLO tomo · tomo slice 41/81.0]
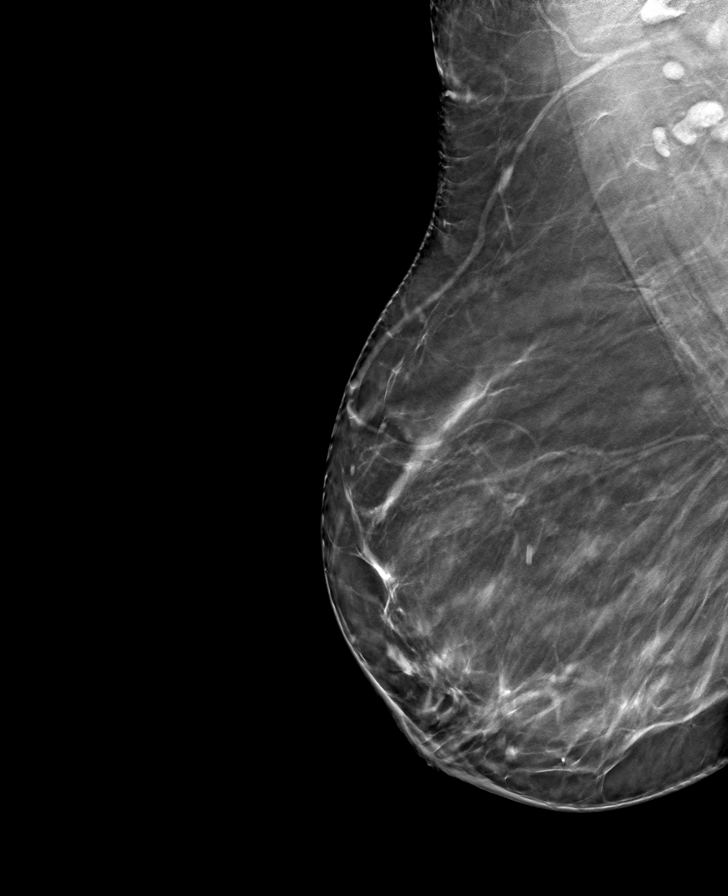

[R CC tomo · tomo slice 38/75.0]
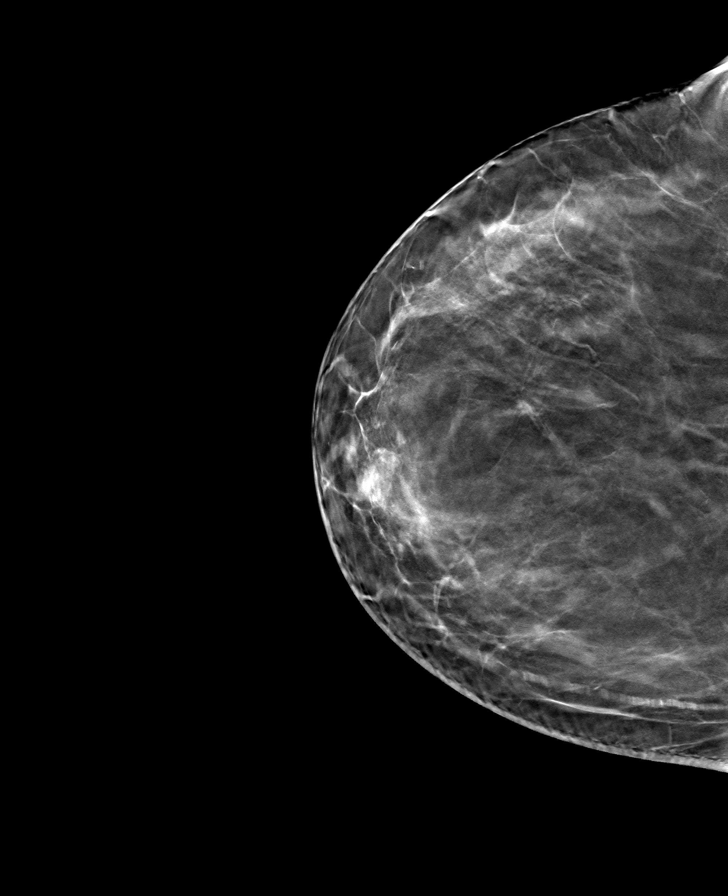

[L CC tomo · tomo slice 37/74.0]
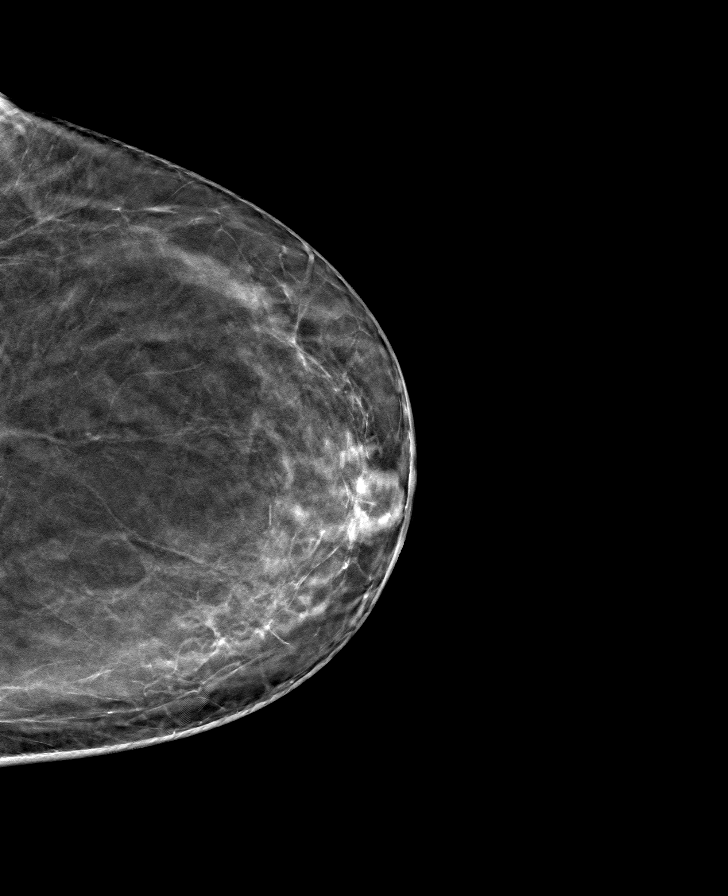

[L MLO tomo · tomo slice 39/78.0]
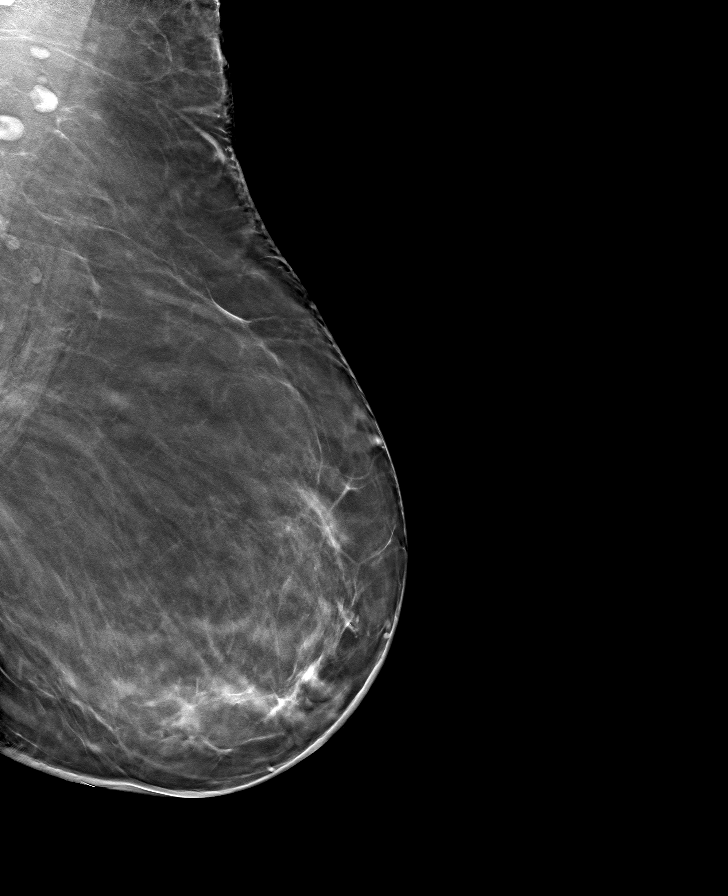

[8 of 24 positions shown; findings below may reference images not displayed]

IMPRESSION: There is no mammographic evidence of malignancy.
Screening mammogram recommended in 1 year.
BI-RADS Category 1: Negative

## 2021-03-18 IMAGING — US US LIVER
1 series · 14 of 25 positions shown · non-contrast
Comparison: Ultrasound of liver 02/28/2020.

HISTORY: 79-year-old female with cirrhosis of liver.
TECHNIQUE: Using real-time and Doppler ultrasound, the liver and gallbladder were evaluated.

[Series 1: us liver · 14 of 38 slices shown]
[im 1/38]
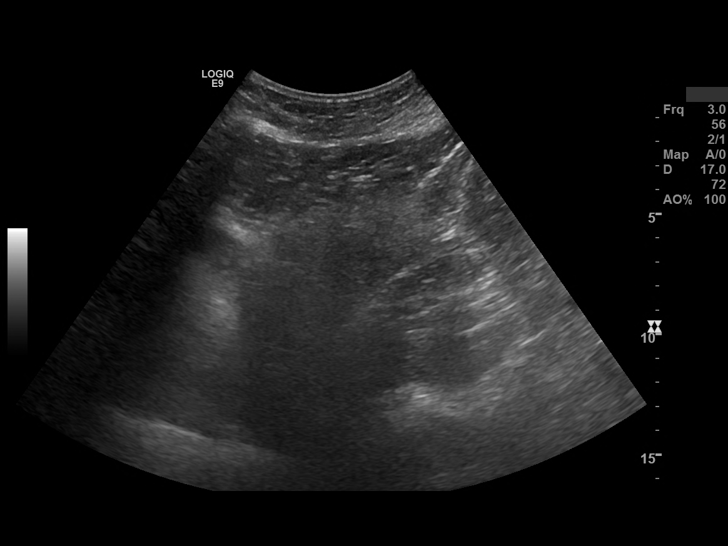
[im 4/38]
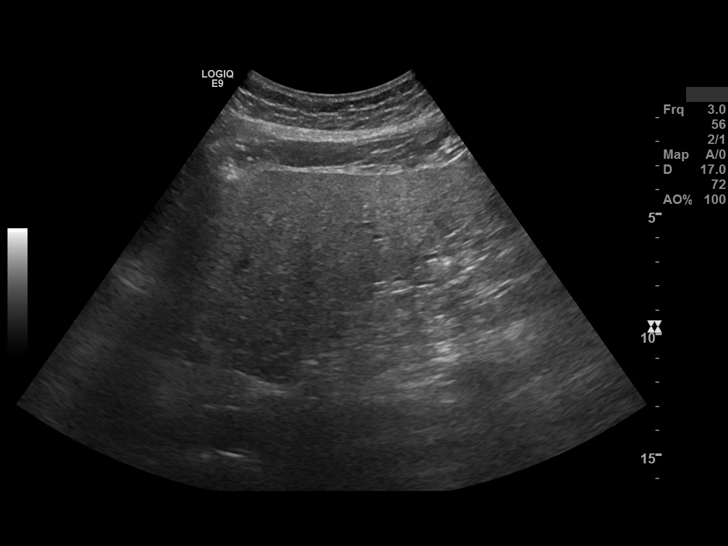
[im 7/38]
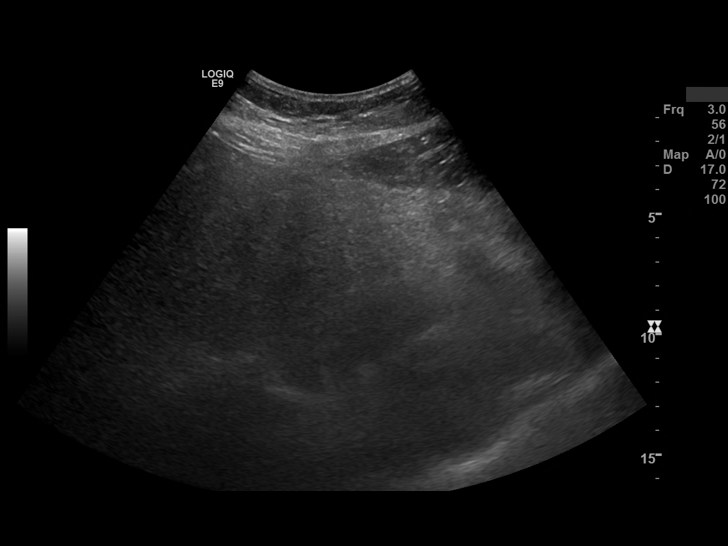
[im 10/38]
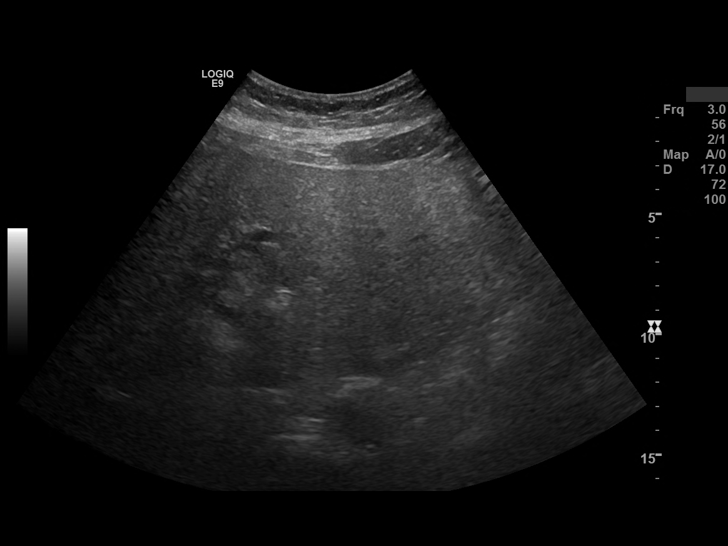
[im 13/38]
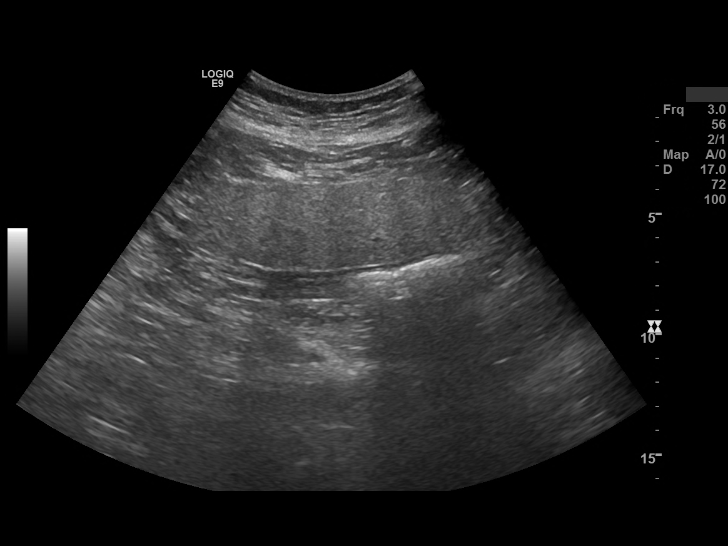
[im 14/38]
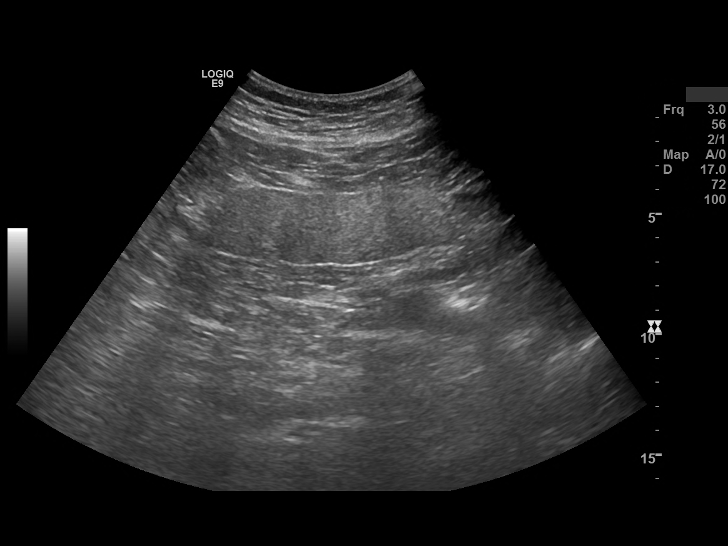
[im 17/38]
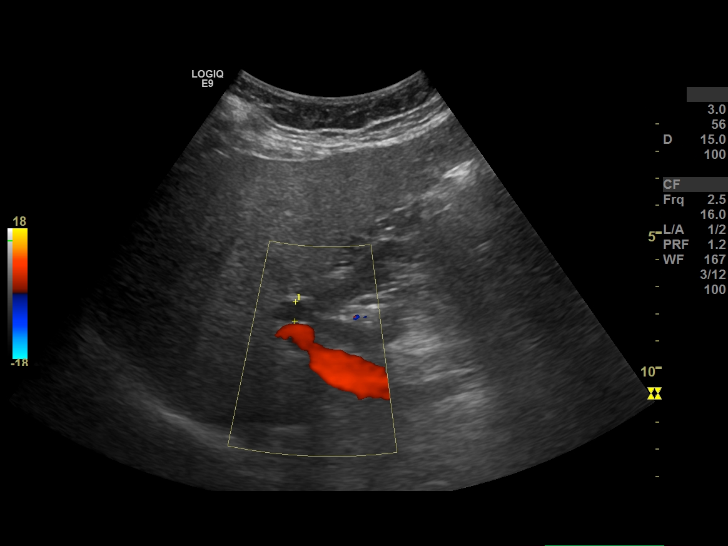
[im 21/38]
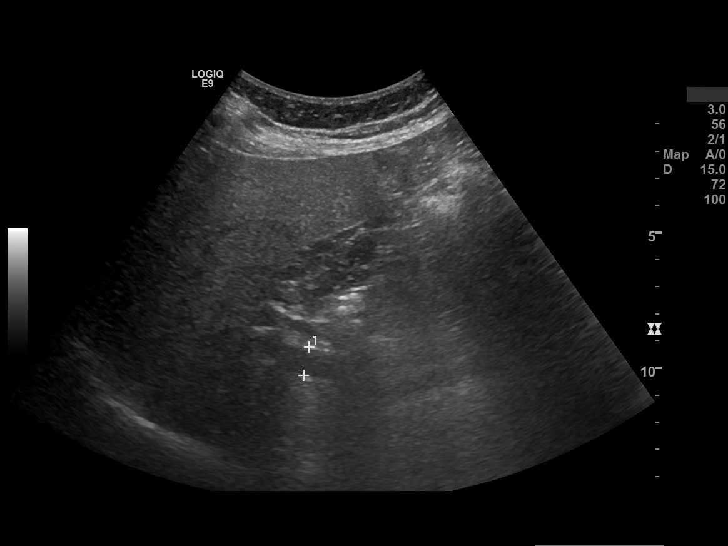
[im 24/38]
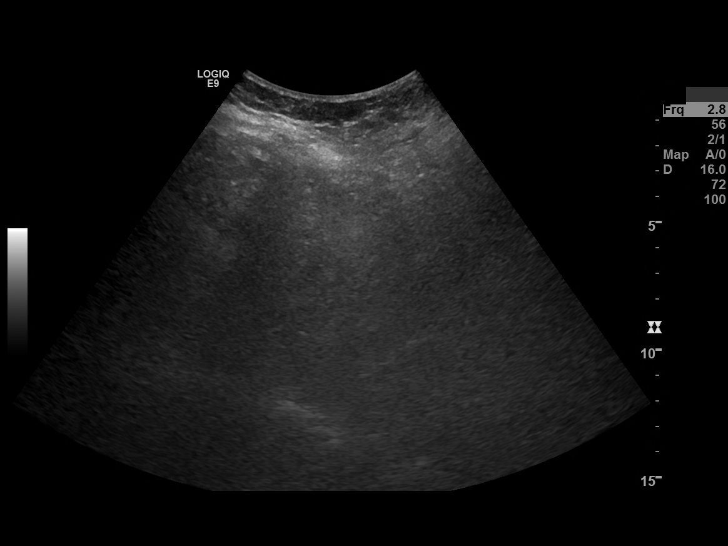
[im 25/38]
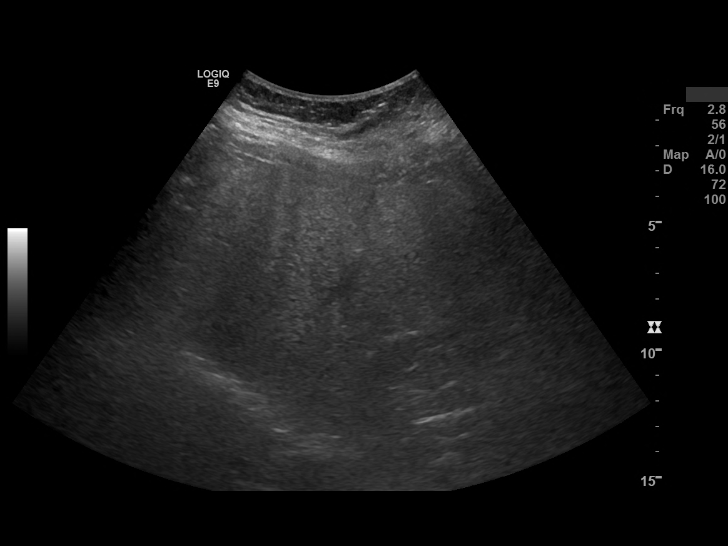
[im 28/38]
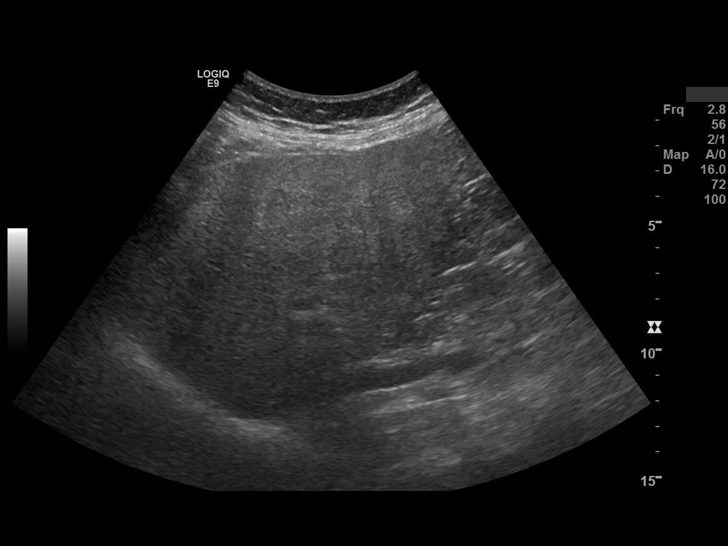
[im 31/38]
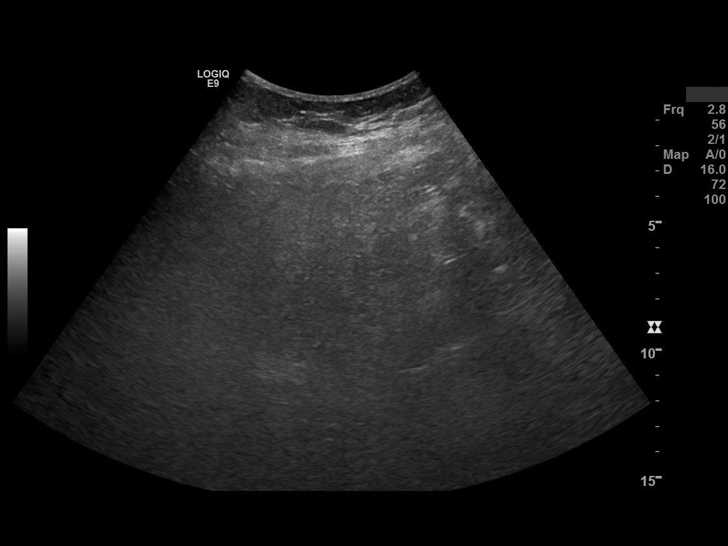
[im 34/38]
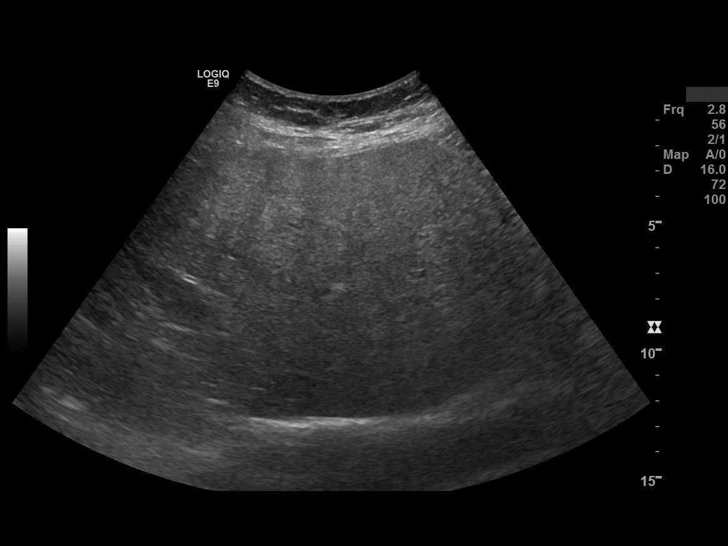
[im 38/38]
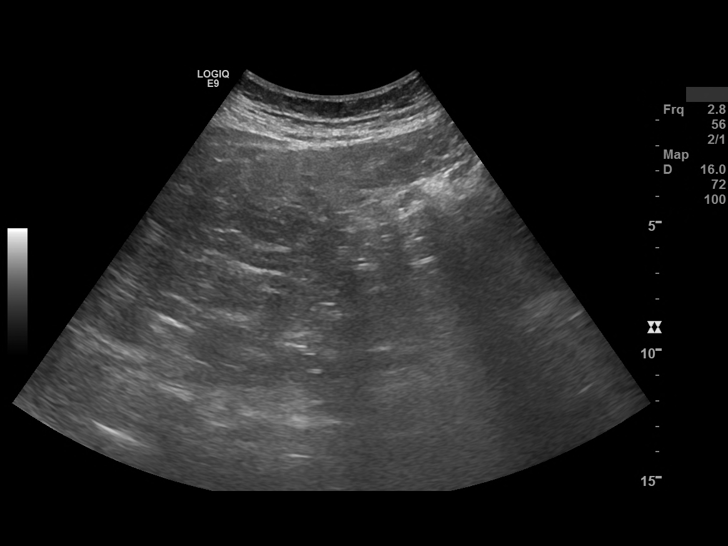

[14 of 25 positions shown; findings below may reference images not displayed]

FINDINGS: Liver: Length of 153 mm in the right midclavicular line. The liver echogenicity is increased. The margin is mildly nodular. Hepatopetal flow is identified in the portal vein. The main portal vein diameter measures 10.6 mm. Portions of the liver are obscured by artifact. No focal masses identified. No intrahepatic biliary ductal dilatation is seen. 

Gallbladder: Cholecystectomy. There is a negative sonographic Murphy's sign. The common bile duct measures 7.14 mm.
IMPRESSION: Cirrhosis. No mass. Patent portal vein with normal direction of flow.

## 2021-03-28 IMAGING — CR SHOULDER LT 2-3 VWS
1 series · 3 of 3 positions shown · non-contrast
Comparison: 05/11/2015

HISTORY: Left shoulder pain
TECHNIQUE: Left shoulder 3 views

[Series 1: internal rotate · 0.17mm/px · 3 of 3 slices shown]
[im 1/3]
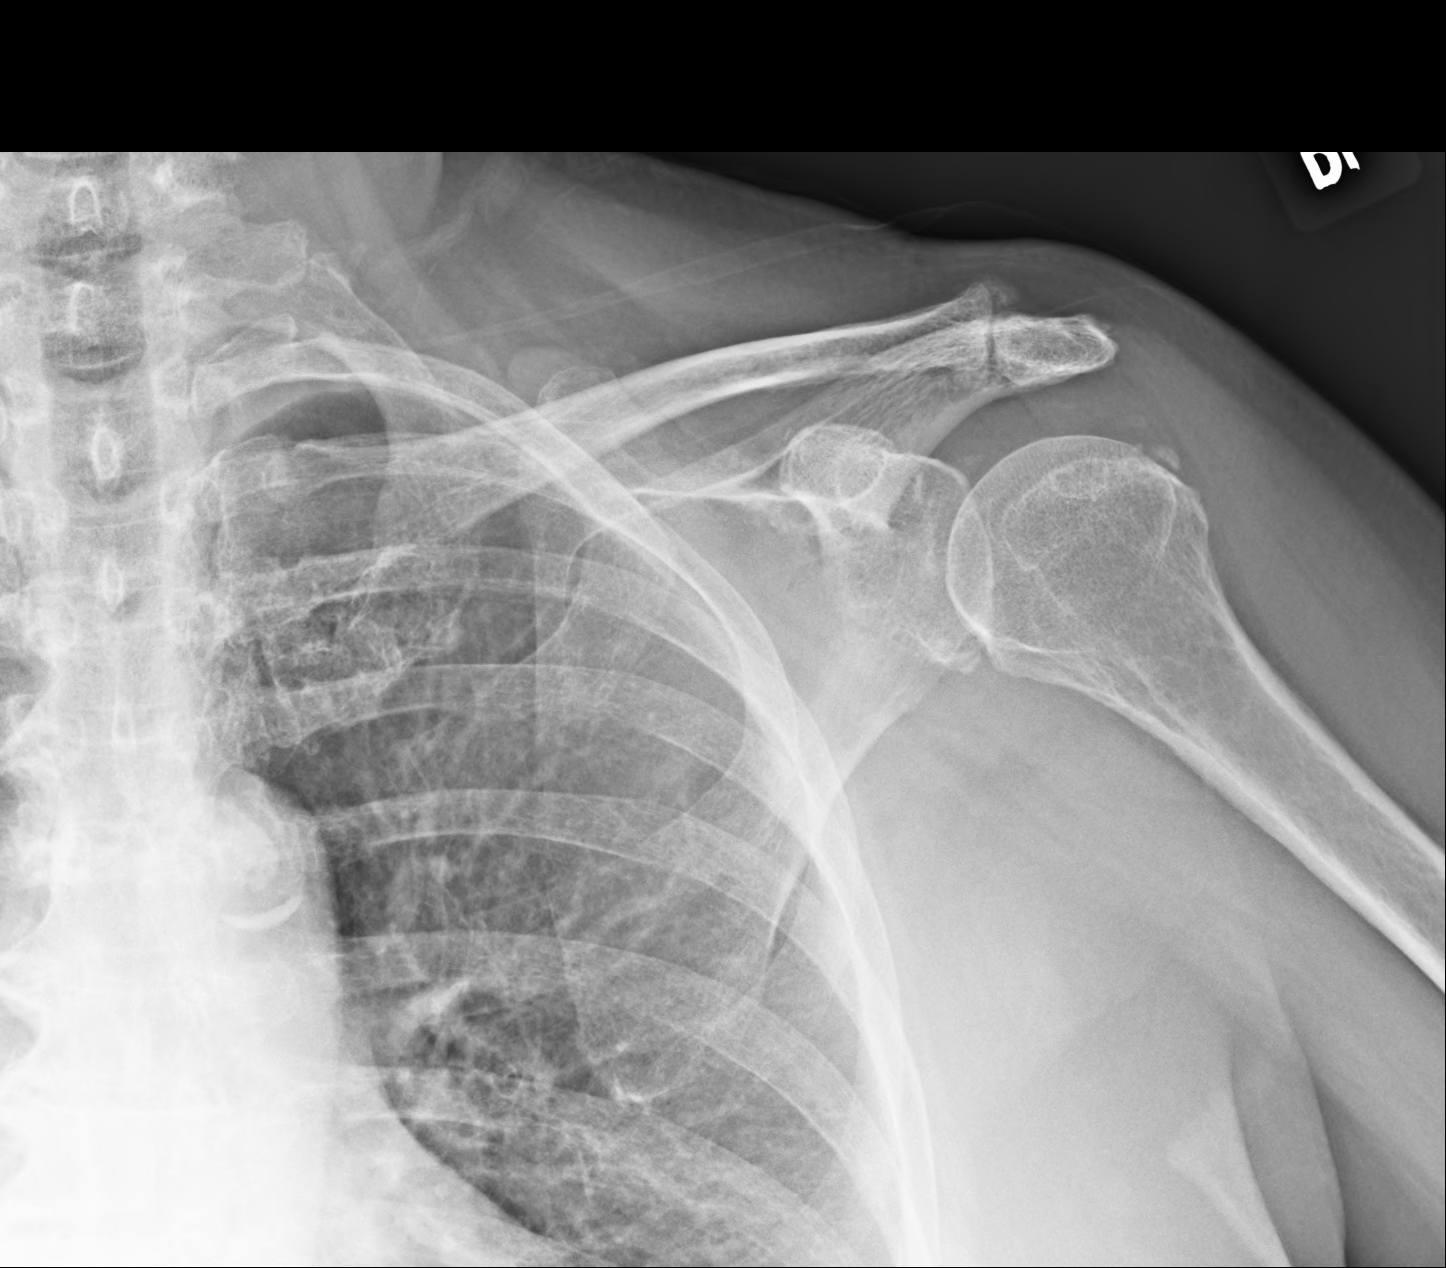
[im 2/3]
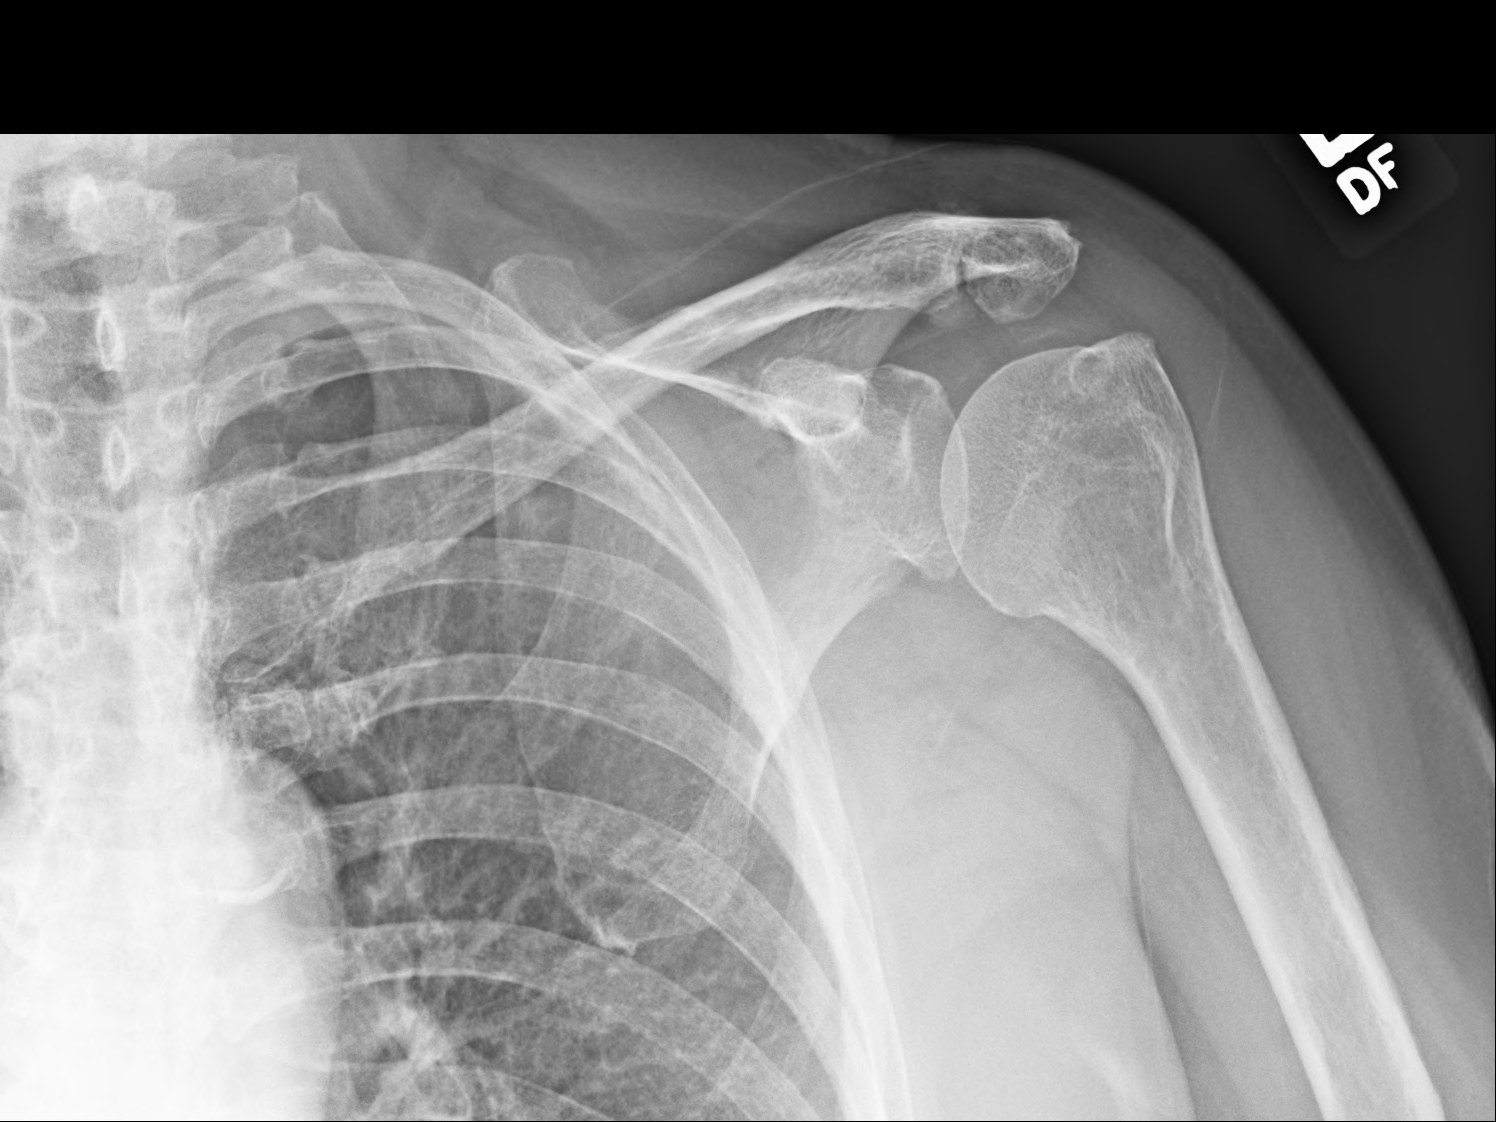
[im 3/3]
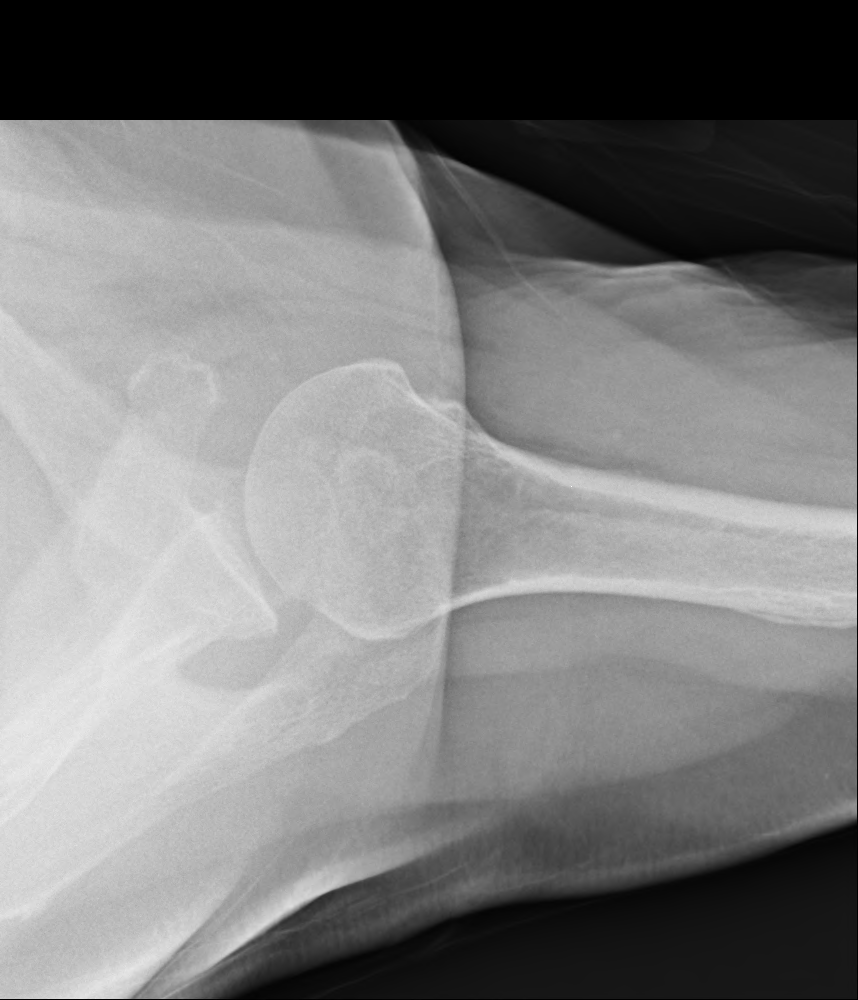

[3 of 3 positions shown; findings below may reference images not displayed]

FINDINGS: 3 views of the left shoulder demonstrate normal mineralization. No visible acute fracture. No destructive lesion. Moderate to advanced DJD of the acromioclavicular joint. Mild DJD glenohumeral joint. Moderate insertional calcific rotator cuff tendinitis. Remaining soft tissues unremarkable.

Incidental note of DJD involving the cervical spine and vascular calcifications of the aorta.
IMPRESSION: Acromioclavicular and glenohumeral DJD as discussed above. Moderate insertional calcific rotator cuff tendinitis.

## 2021-05-15 IMAGING — CR FOOT RT 3 VWS MIN
1 series · 3 of 3 positions shown · non-contrast
Comparison: None.

HISTORY/INDICATIONS:   Right foot pain. History of gout.
TECHNIQUE: Right foot 3 views

[Series 1: lat · 0.17mm/px · 3 of 3 slices shown]
[im 1/3]
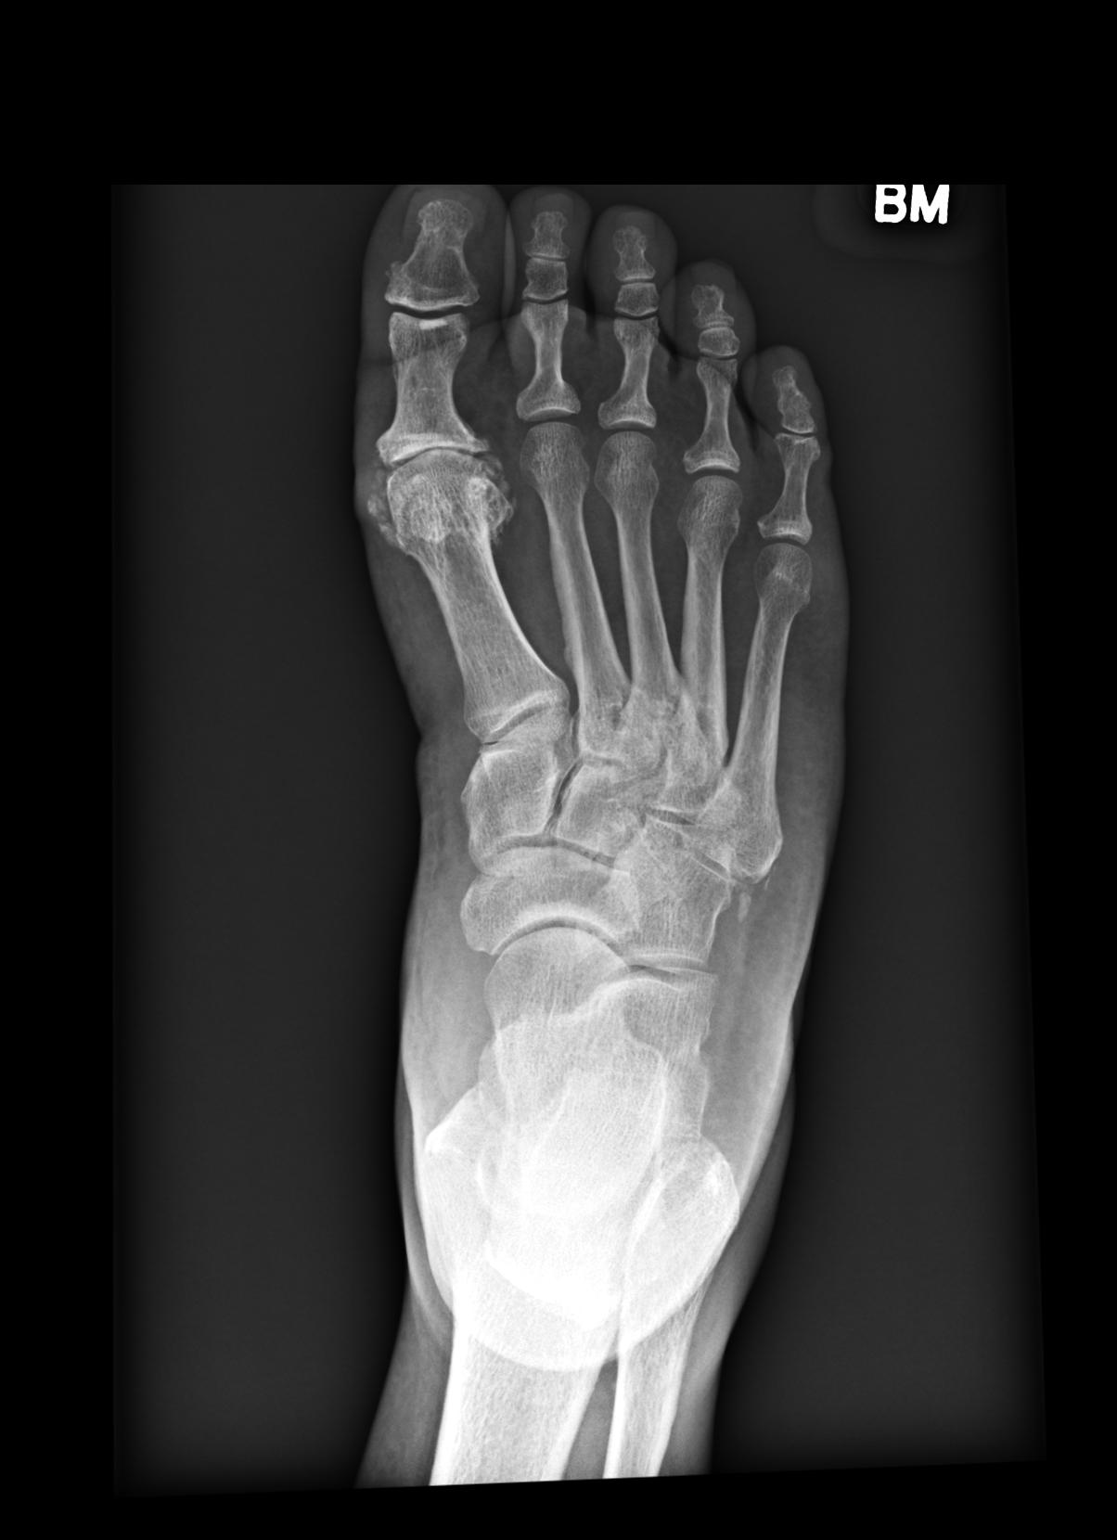
[im 2/3]
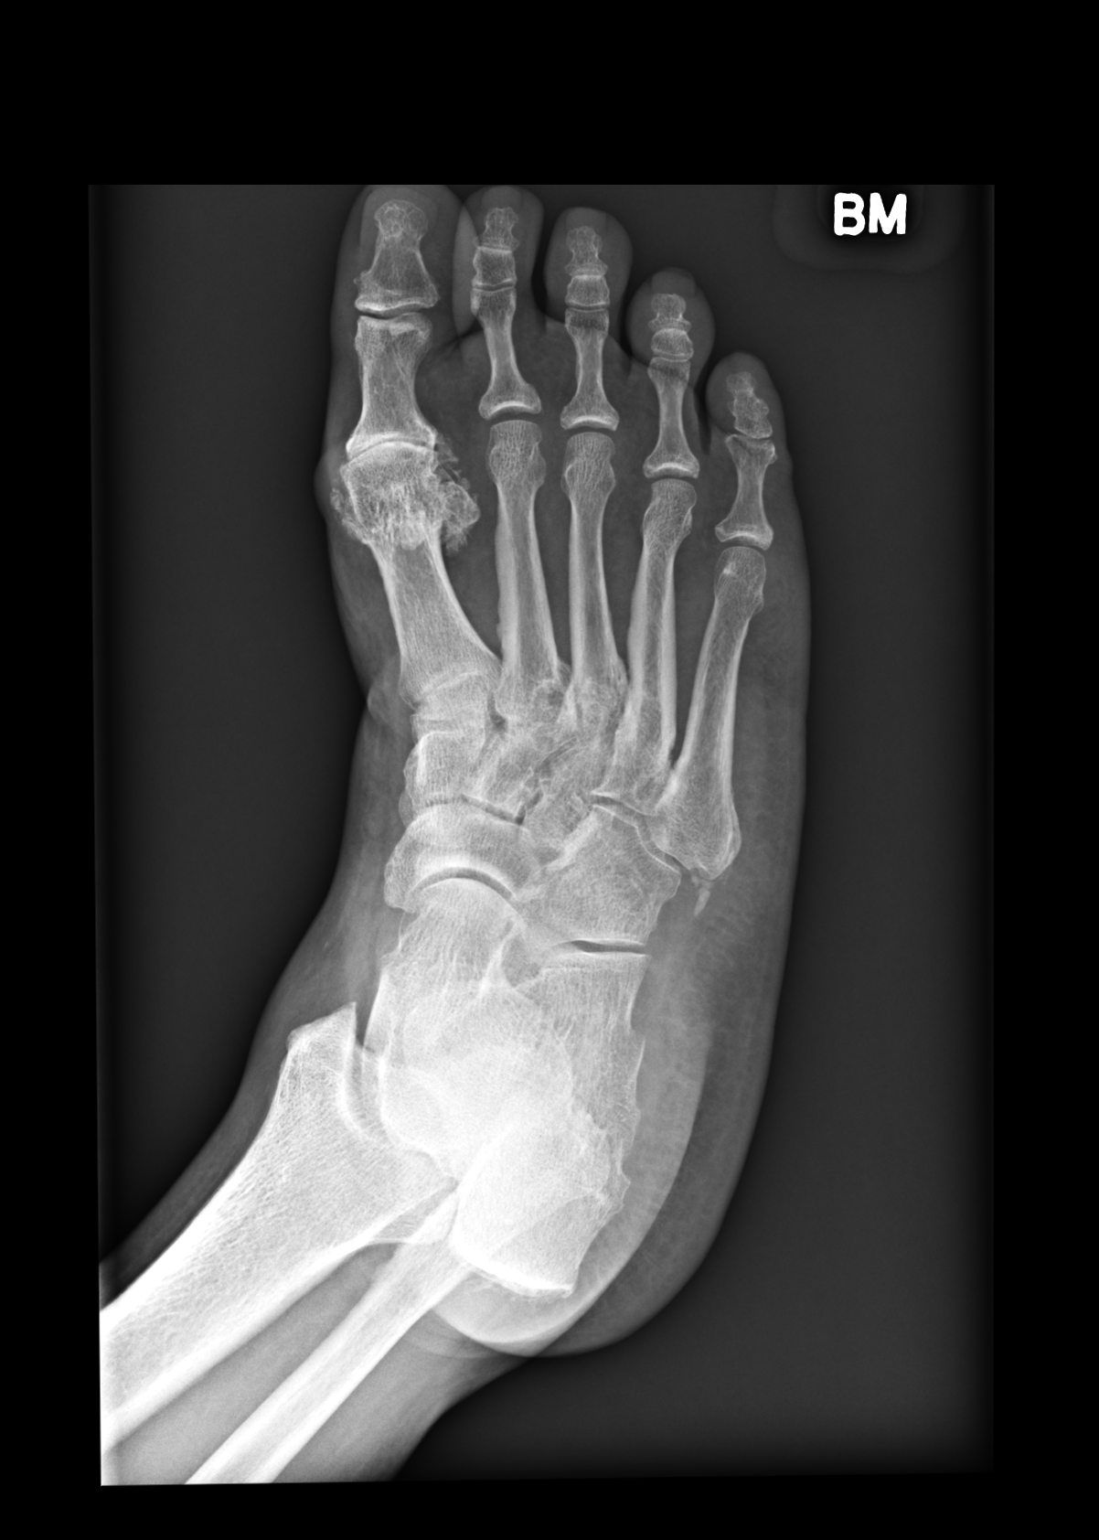
[im 3/3]
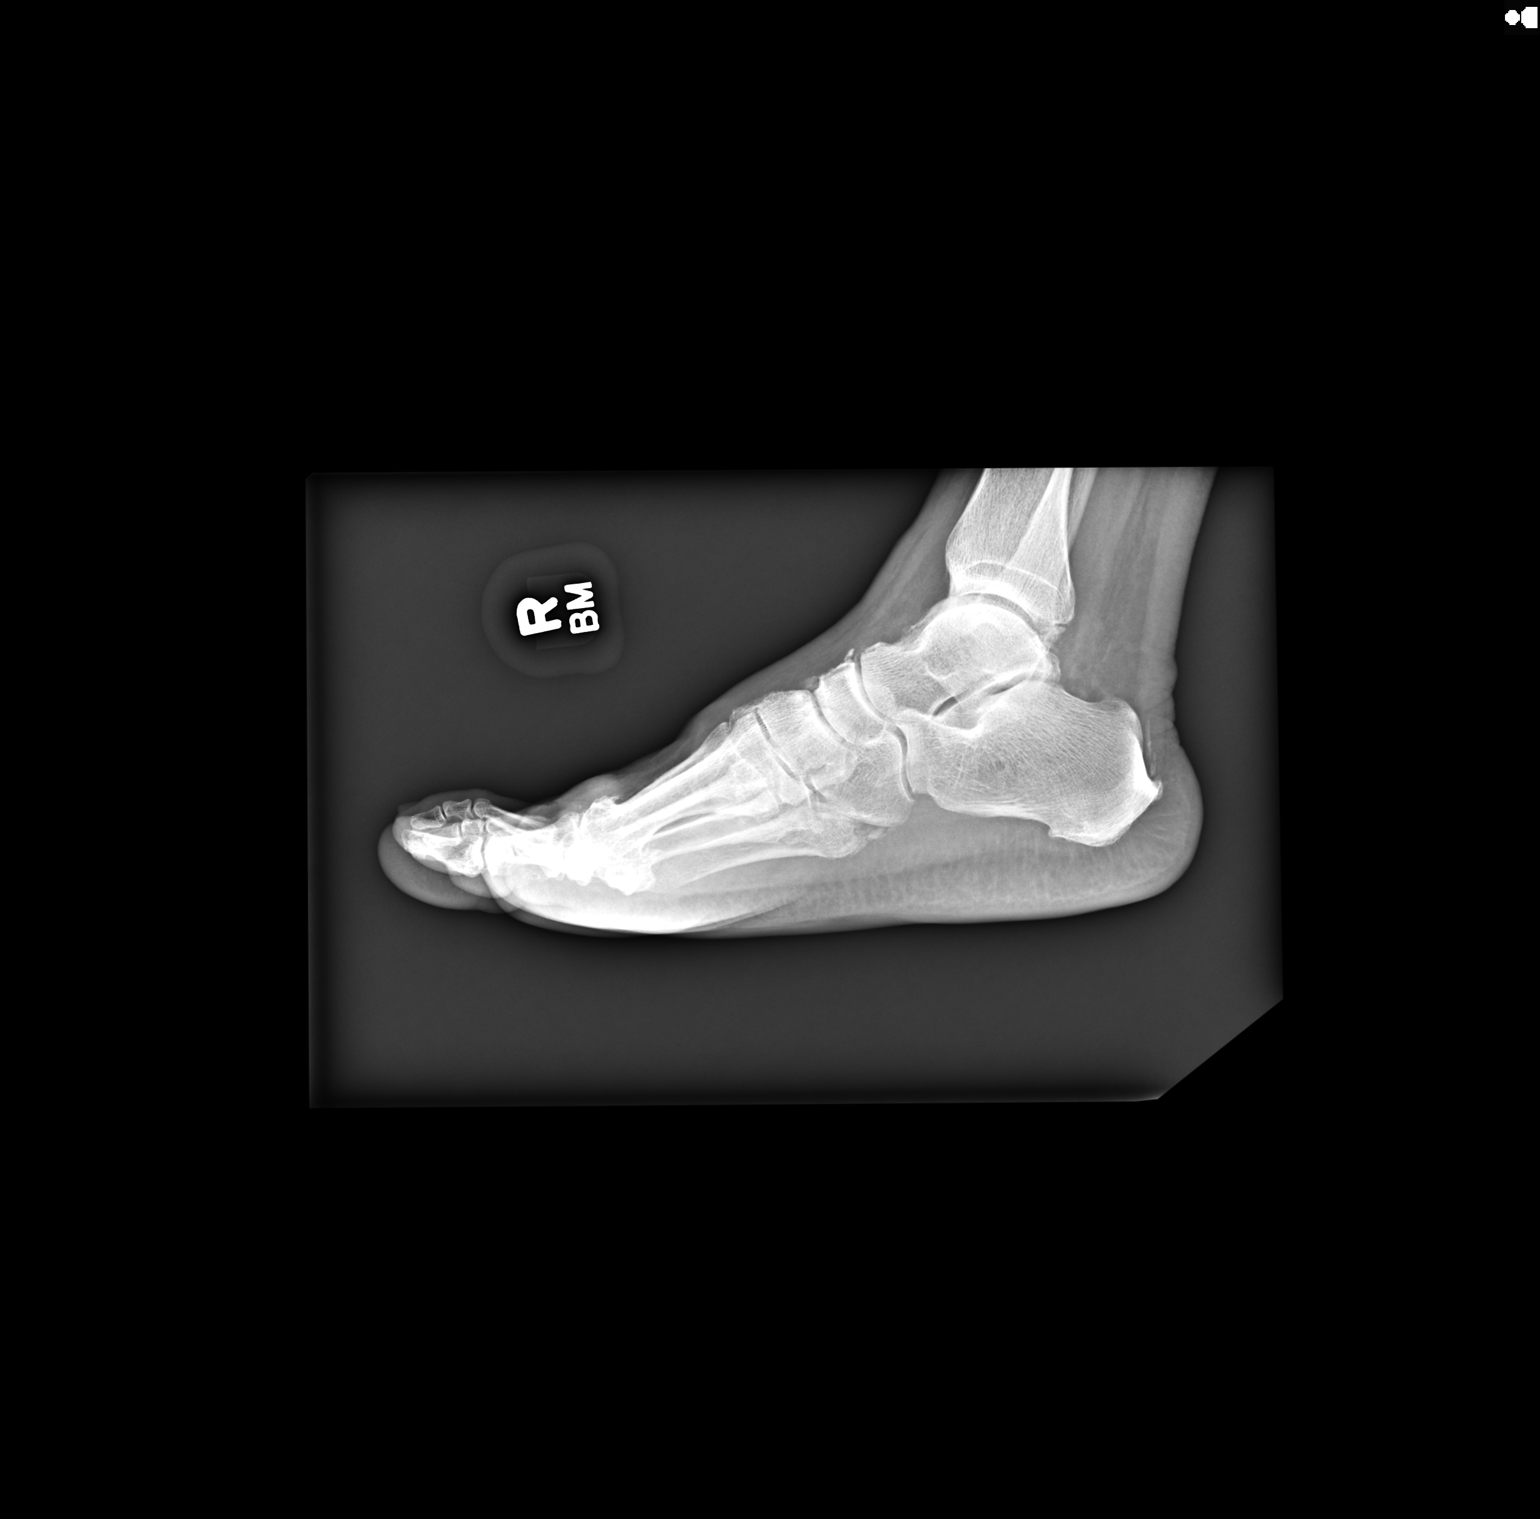

[3 of 3 positions shown; findings below may reference images not displayed]

FINDINGS: Mild to moderate osteoarthritis of right ankle and right foot identified, most prominent at the first MTP joint with joint space narrowing, subchondral sclerosis and spur formation. Periarticular soft tissue calcifications identified, most prominent at the first MTP joint. There may be minimal bone erosion involving base of right foot fifth metatarsal. Additional areas of bone erosion involving proximal 7nd-6th metatarsals and possibly middle and lateral cuneiforms seen. Calcaneal spurs seen. Calcifications of Achilles tendon close to the insertion identified. Additional areas of enthesophyte formation also seen. Mild soft tissue swelling anterior right ankle and dorsal aspect of right foot seen. Mild soft tissue swelling medial to the first MTP joint seen.
IMPRESSION: Osteoarthritis of right ankle and right foot identified, most prominent at the first MTP joint with periarticular soft tissue calcifications seen.

Areas of bone erosion as noted, possibly due to gout arthritis.

Mild soft tissue swelling anterior right ankle and dorsal aspect of right foot seen.

## 2021-06-20 IMAGING — US US LIVER
1 series · 14 of 25 positions shown · non-contrast
Comparison: Liver ultrasound 03/18/2021.

HISTORY: 79 year old female with cirrhosis
TECHNIQUE: Gray-scale and color and spectral Doppler ultrasound imaging of the abdomen was performed.

[Series 2: us liver · 14 of 33 slices shown]
[im 1/33]
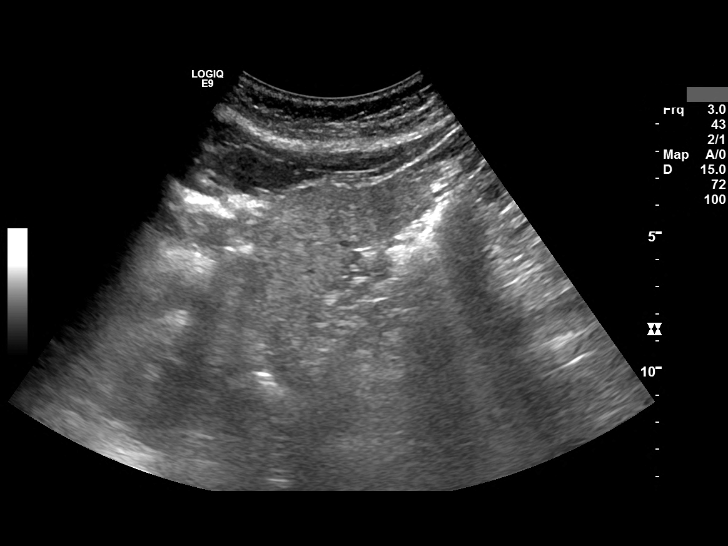
[im 3/33]
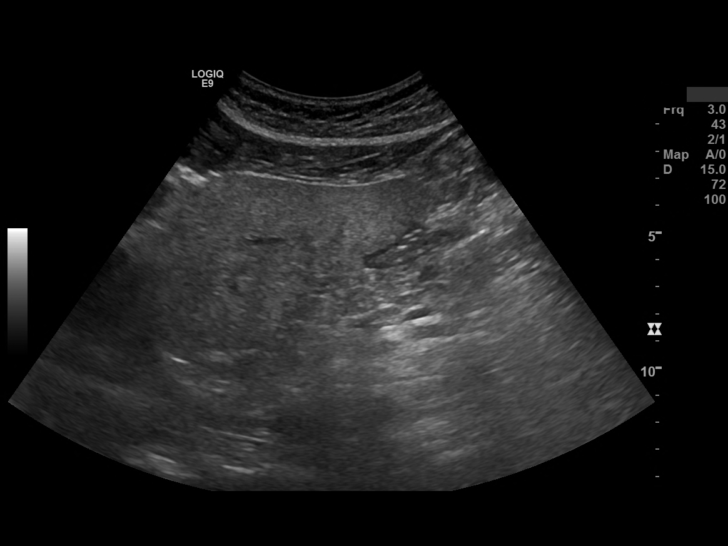
[im 6/33]
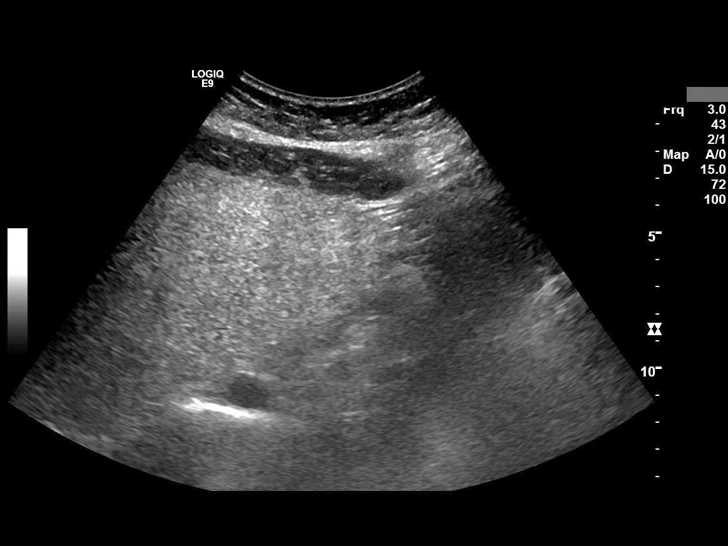
[im 9/33]
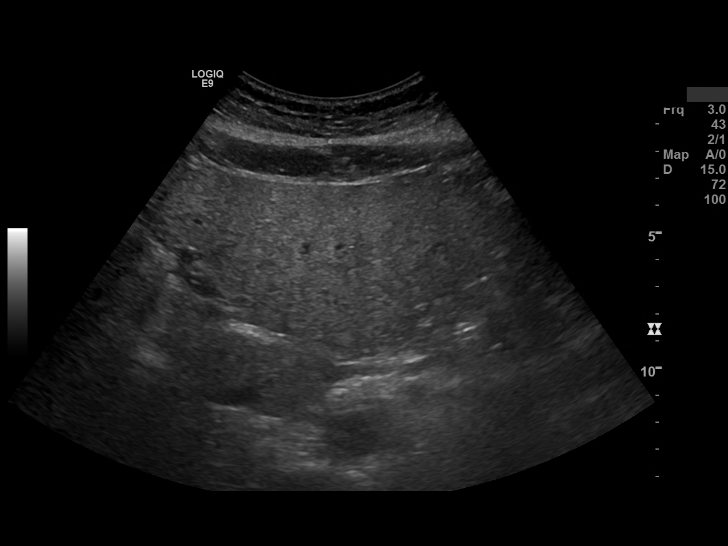
[im 11/33]
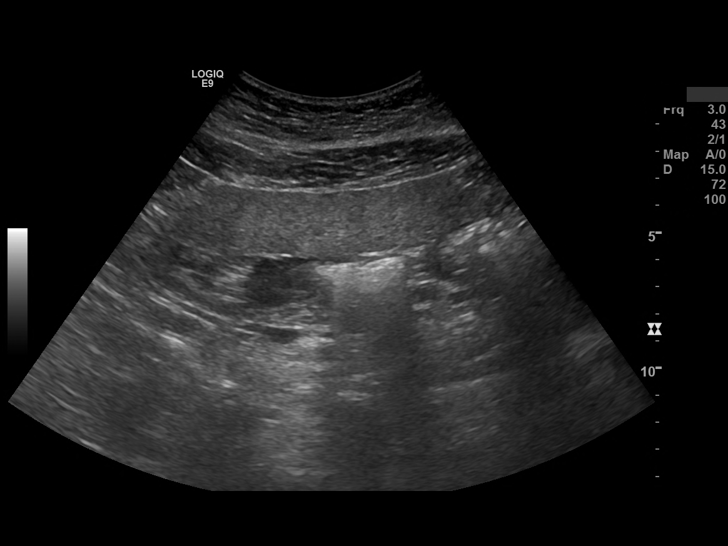
[im 13/33]
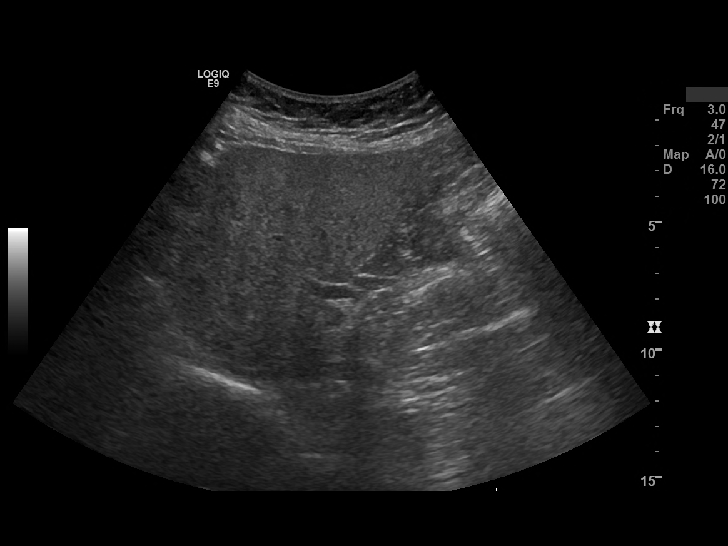
[im 15/33]
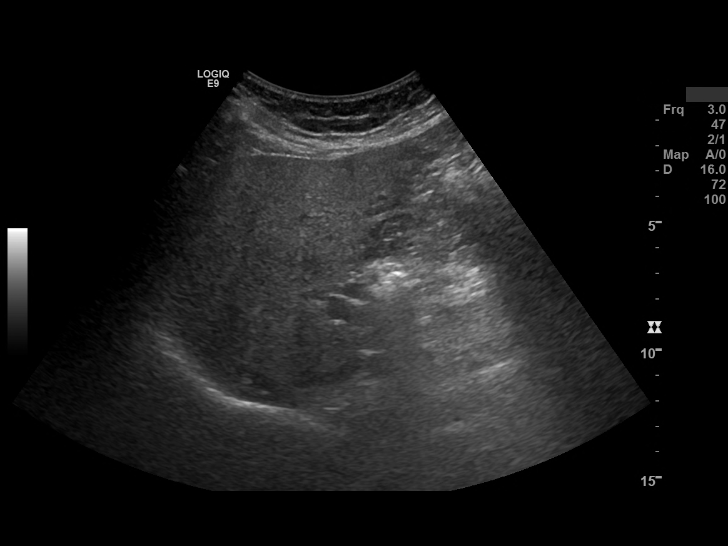
[im 18/33]
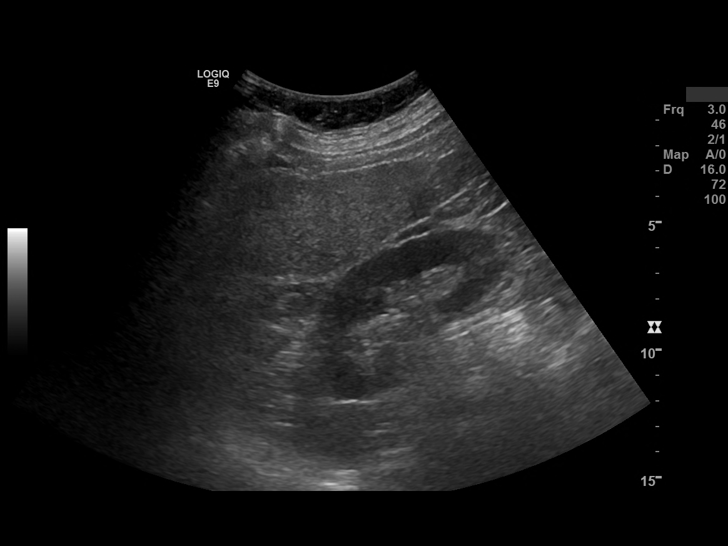
[im 21/33]
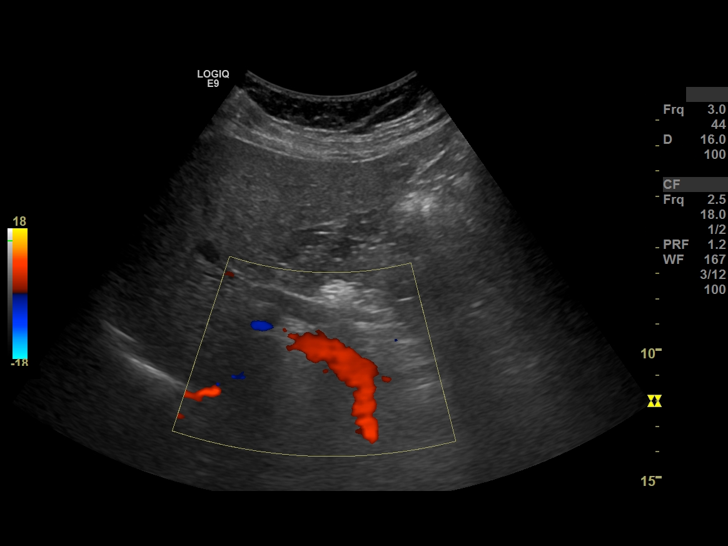
[im 22/33]
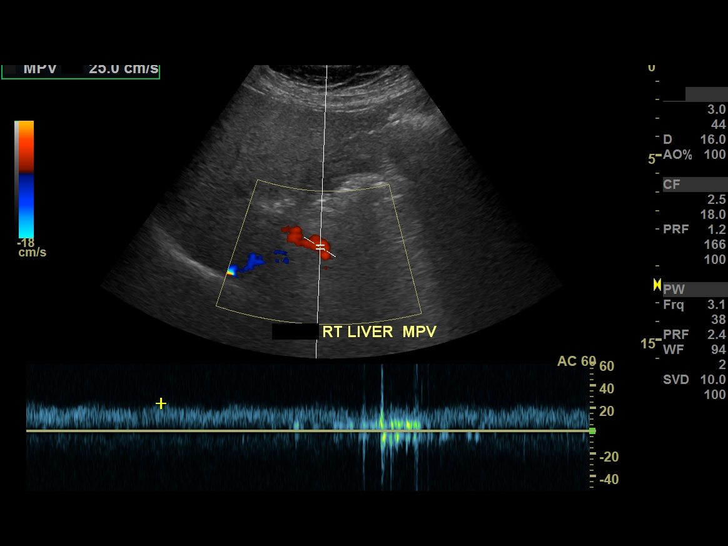
[im 25/33]
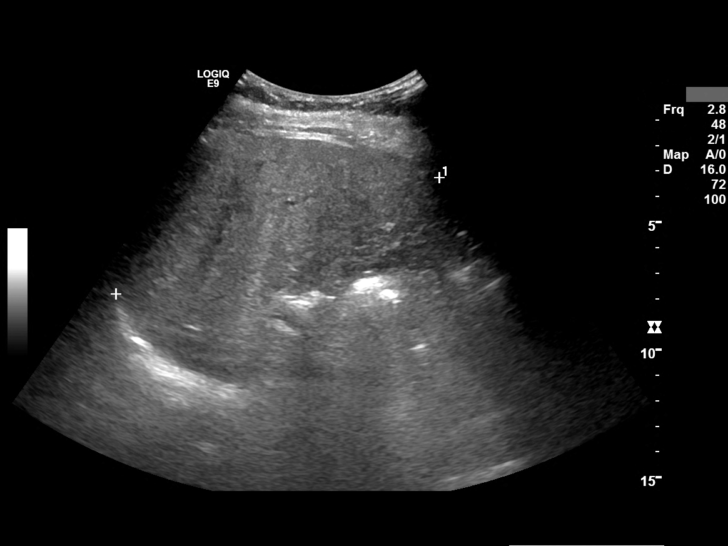
[im 27/33]
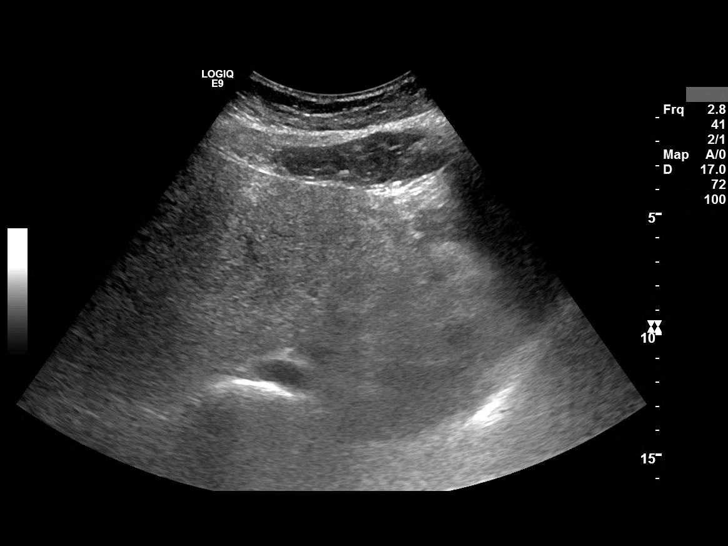
[im 30/33]
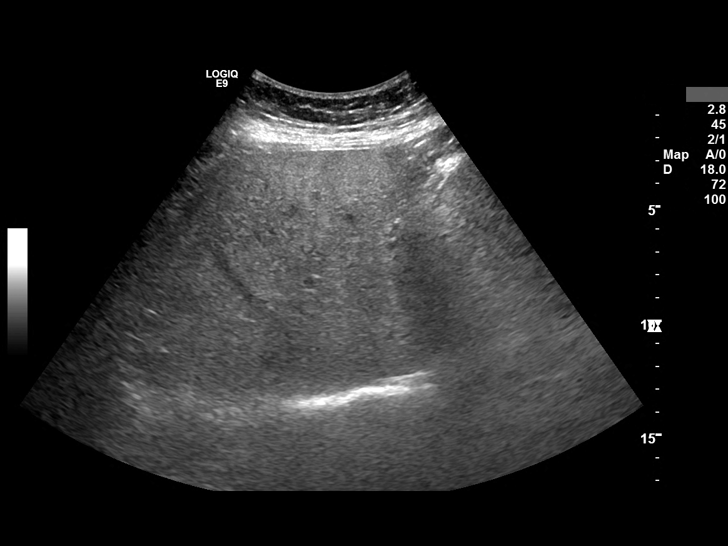
[im 33/33]
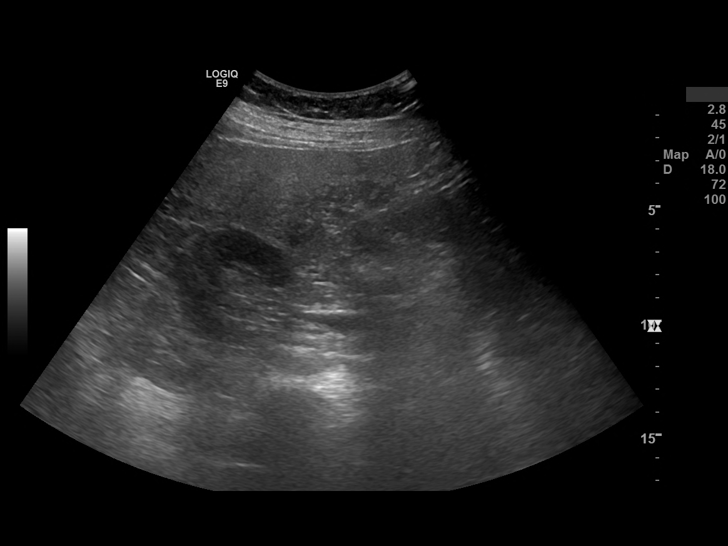

[14 of 25 positions shown; findings below may reference images not displayed]

FINDINGS: Image quality: Degraded by body habitus and/or bowel gas artifact.

LIVER: 

*
Size: Normal in size, measuring 134 mm in length. 

*
Parenchymal Echogenicity: Increased

*
Parenchymal Echotexture: Coarsened. 

*
Surface: Smooth. 

*
Focal Hepatic Lesions: None.. 

*
Portal Vein: Patent with appropriate direction of flow and normal spectral waveform.

BILIARY DUCTAL SYSTEM: 

*
Common Bile Duct: Measures 7.09 mm in greatest diameter. 

GALLBLADDER: 

Status post cholecystectomy.

RIGHT KIDNEY:

Partially visualized and is unremarkable.

PERITONEUM: 

No free fluid is identified within the provided images.
IMPRESSION: Abnormal hepatic parenchymal echogenicity and echotexture could be related to hepatosteatosis and/or other chronic liver disease.

## 2021-09-10 IMAGING — US US LIVER
1 series · 14 of 25 positions shown · non-contrast
Comparison: Ultrasound liver 06/20/21

HISTORY: 80-year-old female with cirrhosis of liver.
TECHNIQUE: Using real-time and Doppler ultrasound, the liver and gallbladder were evaluated.

[Series 1: us liver · 14 of 29 slices shown]
[im 1/29]
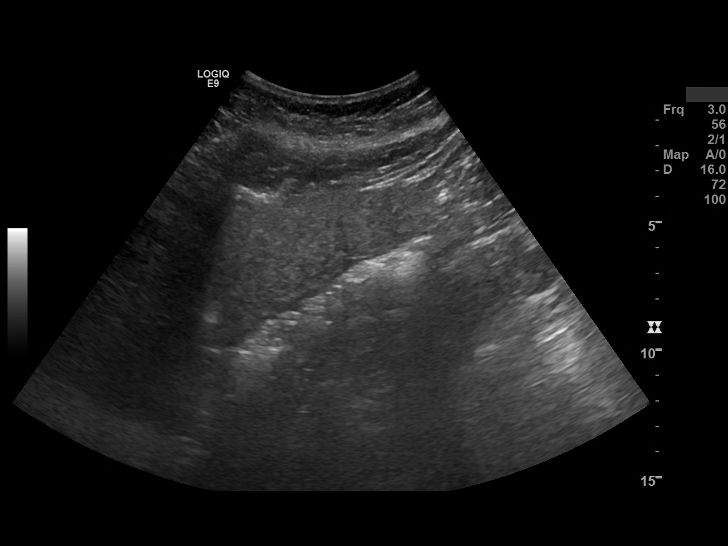
[im 3/29]
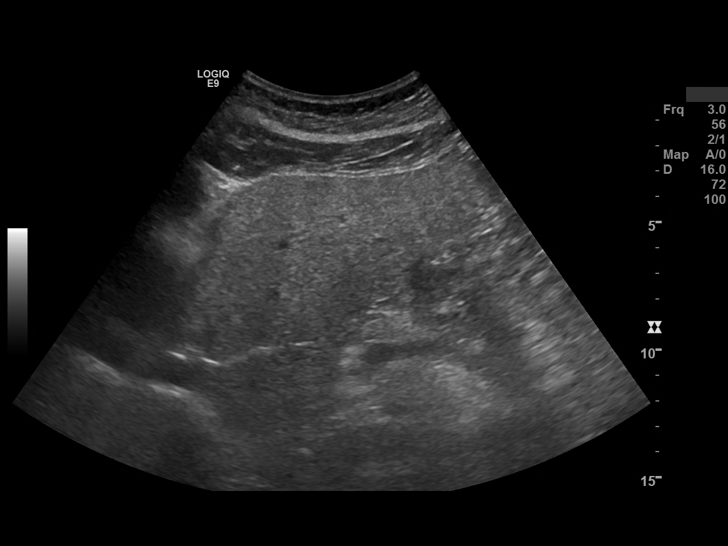
[im 5/29]
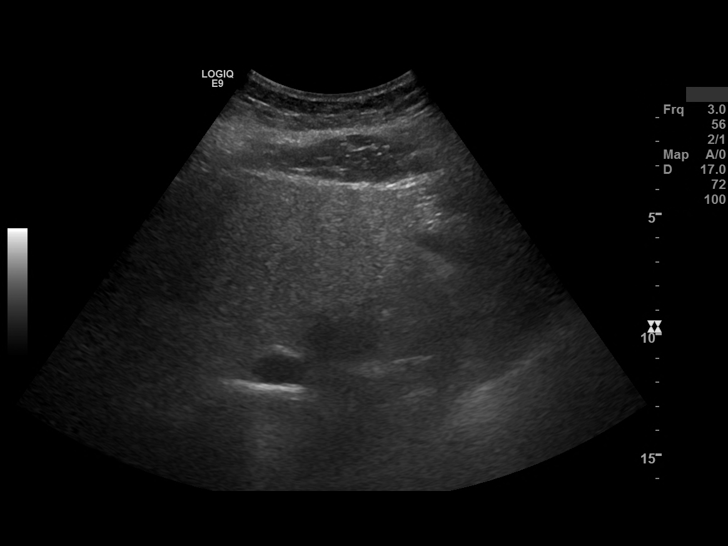
[im 8/29]
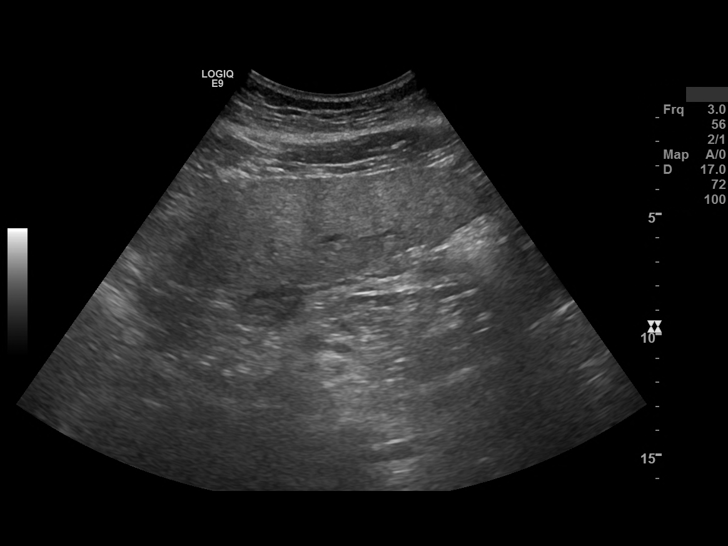
[im 10/29]
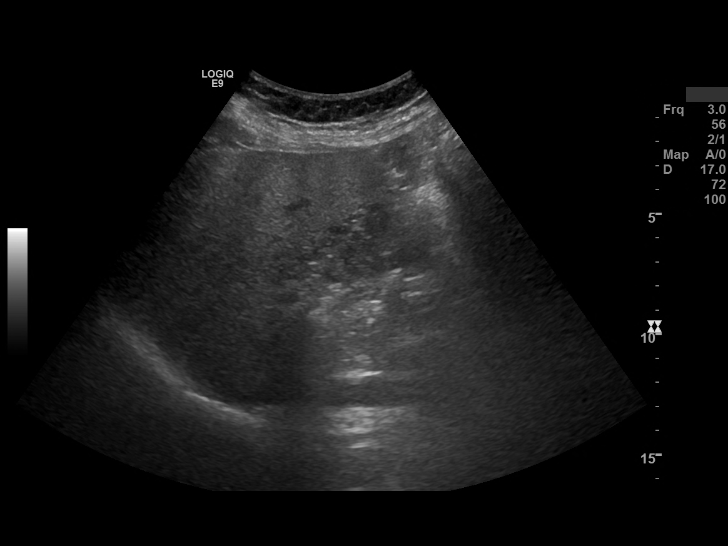
[im 11/29]
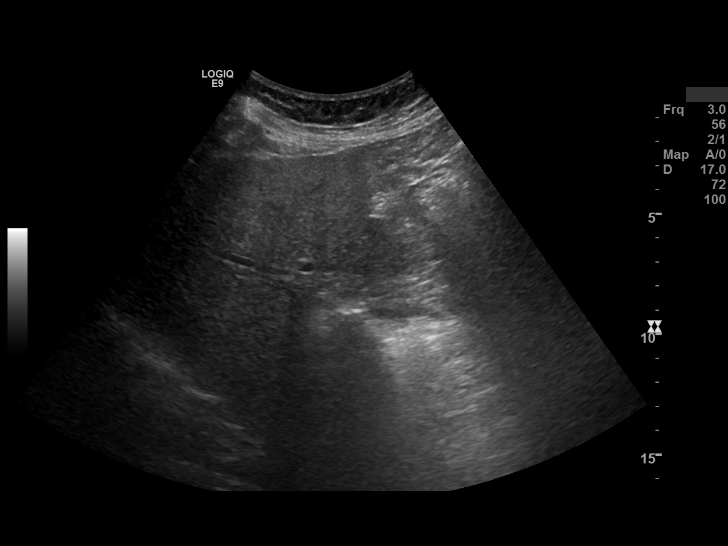
[im 13/29]
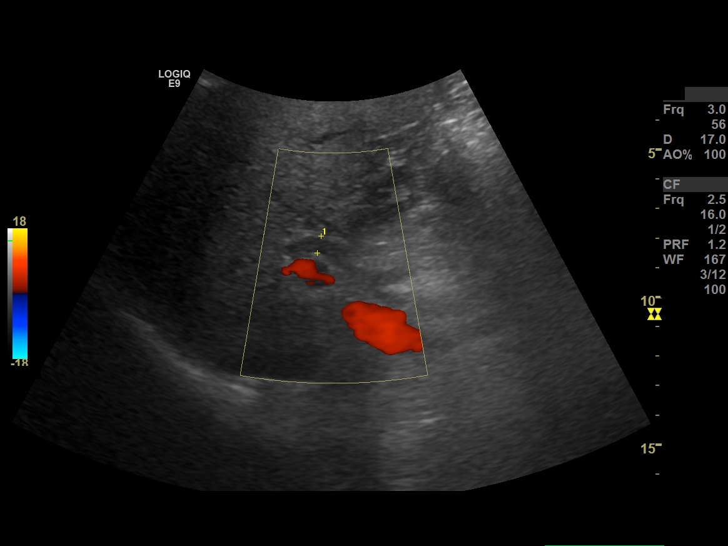
[im 16/29]
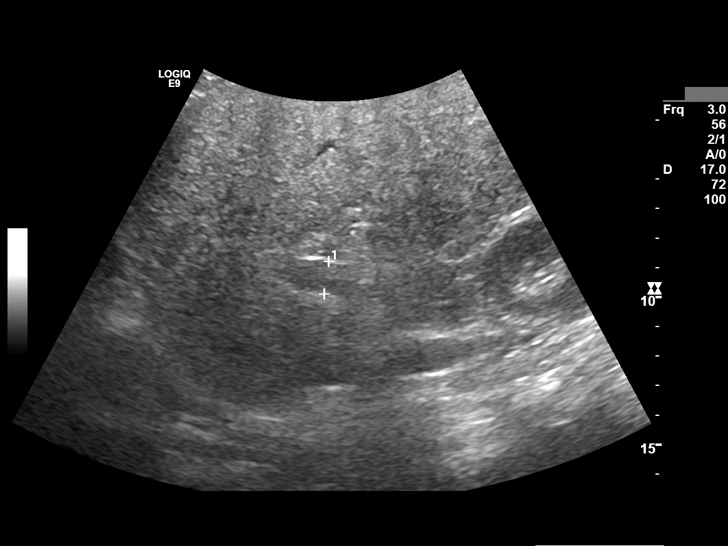
[im 18/29]
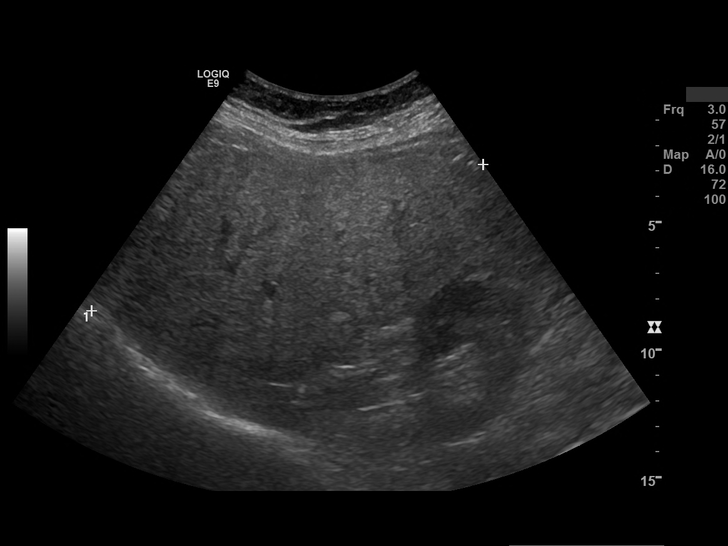
[im 19/29]
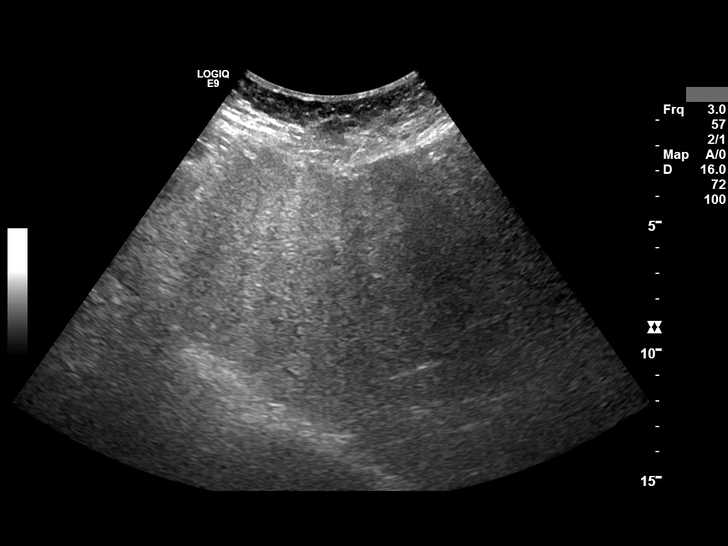
[im 22/29]
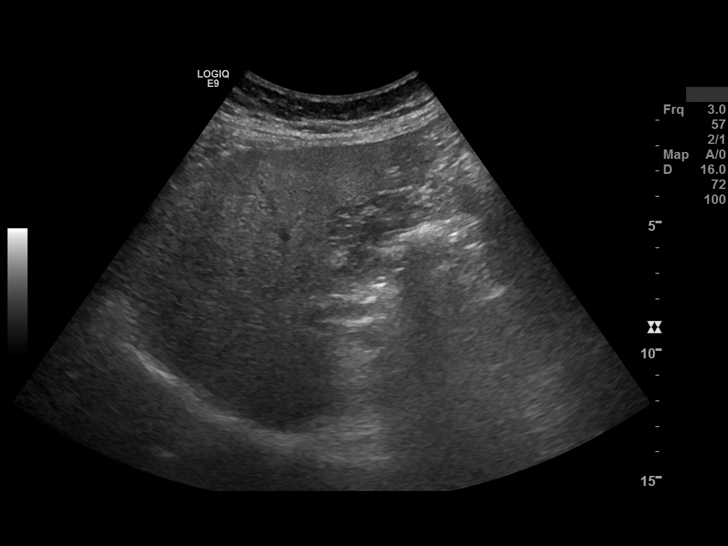
[im 24/29]
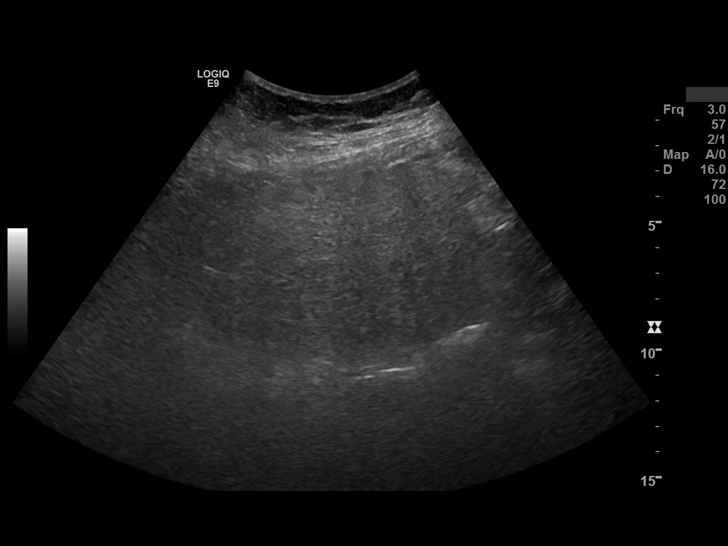
[im 26/29]
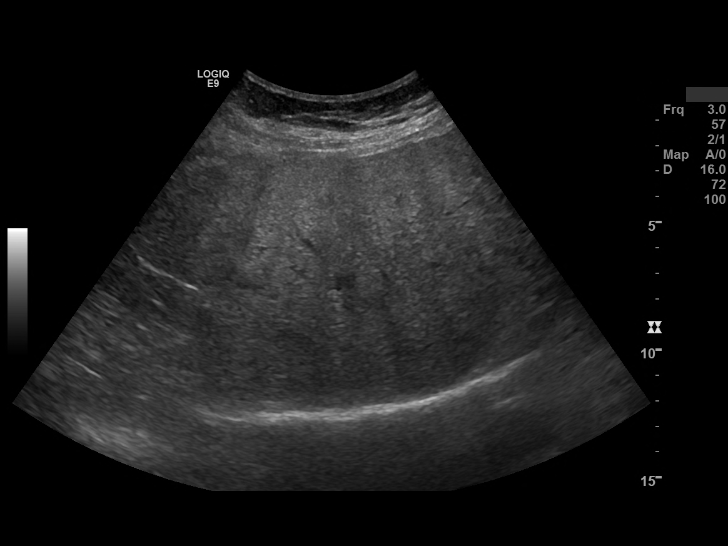
[im 29/29]
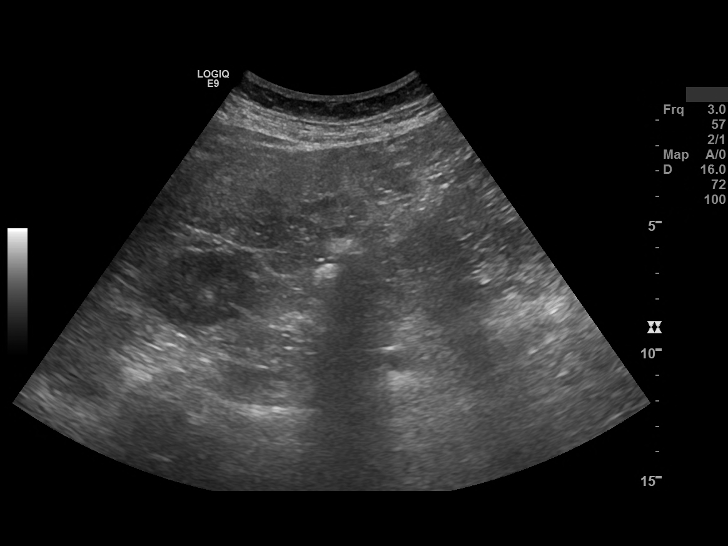

[14 of 25 positions shown; findings below may reference images not displayed]

FINDINGS: Liver: Length of  163 mm in the right midclavicular line. The liver echotexture is coarsened and the margin is slightly nodular. The liver echogenicity is increased. Hepatopetal flow is identified in the portal vein. The main portal vein diameter measures 11.1 mm. No focal masses identified. No intrahepatic biliary ductal dilatation is seen. 

Gallbladder: Cholecystectomy. There is a  negative sonographic Murphy's sign. The common bile duct measures 5.86  mm.
IMPRESSION: 1.
Cirrhosis. No mass by ultrasound. Patent portal vein with normal direction of flow.

2.
Previous cholecystectomy. No bile duct dilation.

## 2021-12-03 IMAGING — MR MRI KNEE LT WO CONTRAST
5 series · 40 of 40 positions shown · non-contrast
Comparison: None available.

INDICATION: Left knee pain.
TECHNIQUE: Multiplanar, multisequence imaging of the left knee was performed without contrast.

[Series 2: t2_axial_fs · axial · 4.0mm · 0.62mm/px · z∈[-83,+42]mm · 10 of 26 slices shown]
[im 1/26]
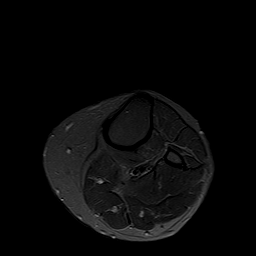
[im 3/26]
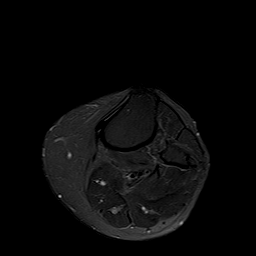
[im 6/26]
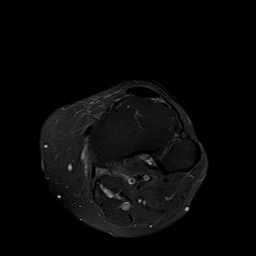
[im 9/26]
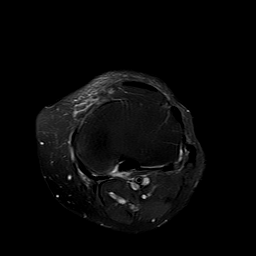
[im 12/26]
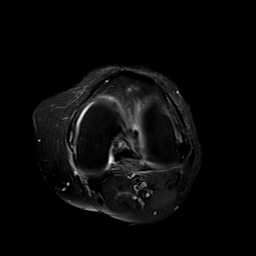
[im 14/26]
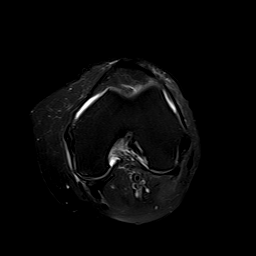
[im 17/26]
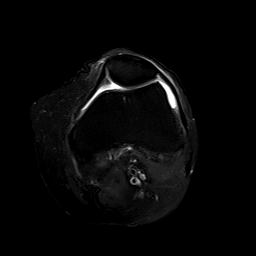
[im 20/26]
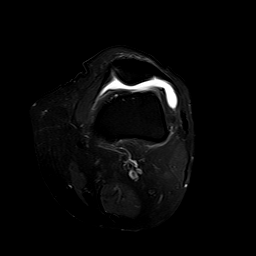
[im 23/26]
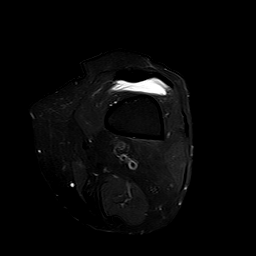
[im 26/26]
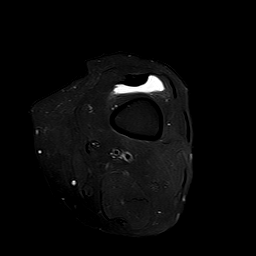

[Series 4: t1_cor · coronal · 4.0mm · 0.44mm/px · 7 of 22 slices shown]
[im 1/22]
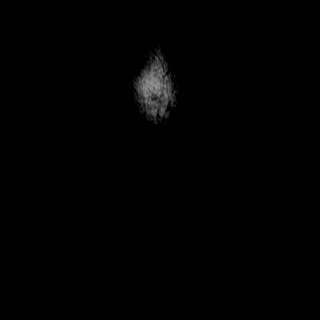
[im 4/22]
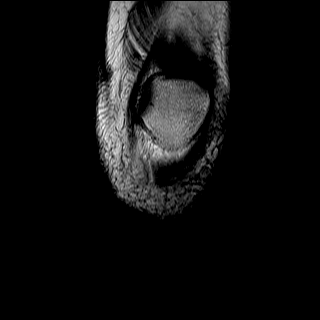
[im 8/22]
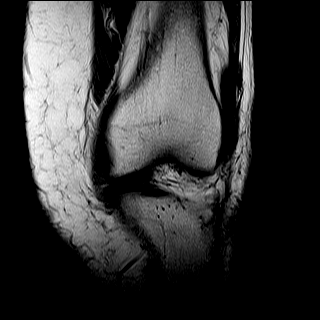
[im 11/22]
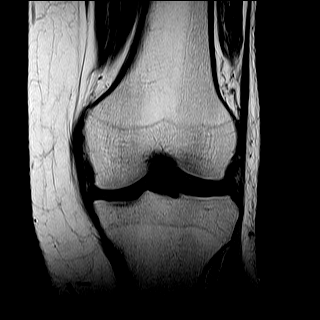
[im 15/22]
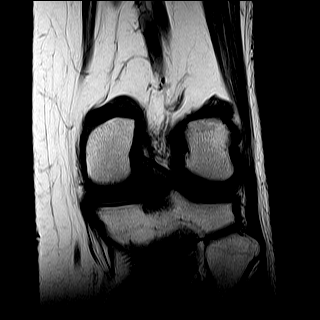
[im 18/22]
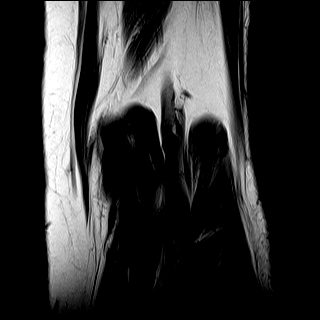
[im 22/22]
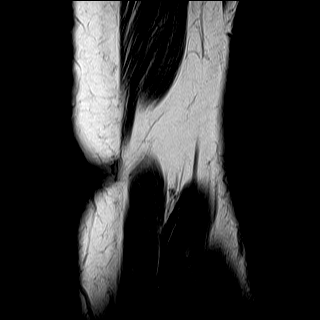

[Series 5: pd_sag_fs · sagittal · 3.0mm · 0.57mm/px · 8 of 25 slices shown]
[im 1/25]
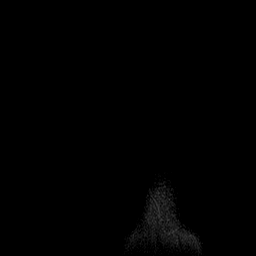
[im 4/25]
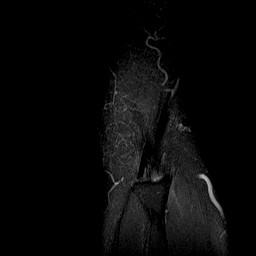
[im 7/25]
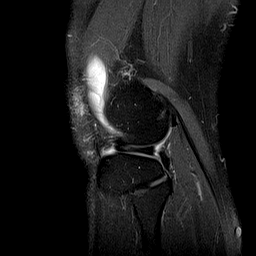
[im 11/25]
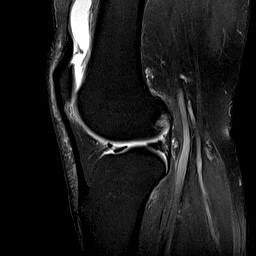
[im 14/25]
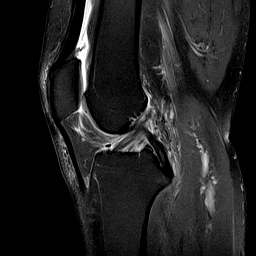
[im 18/25]
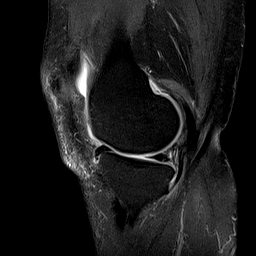
[im 21/25]
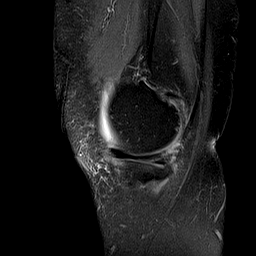
[im 25/25]
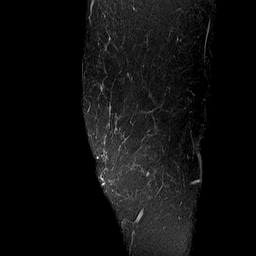

[Series 6: t2_sag_fs · sagittal · 3.0mm · 0.57mm/px · 8 of 25 slices shown]
[im 1/25]
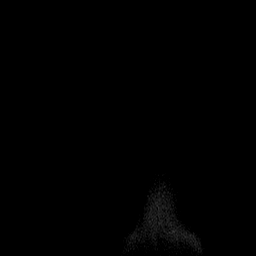
[im 4/25]
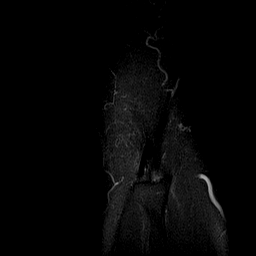
[im 7/25]
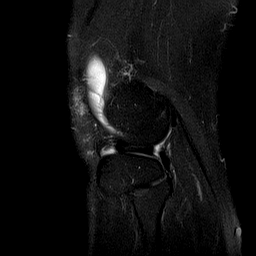
[im 11/25]
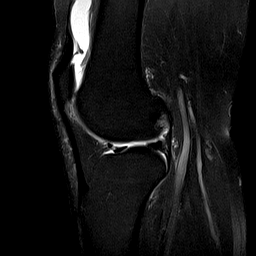
[im 14/25]
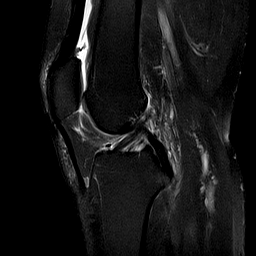
[im 18/25]
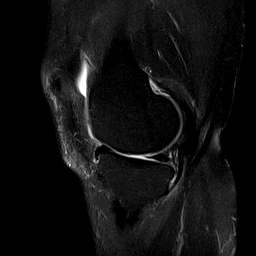
[im 21/25]
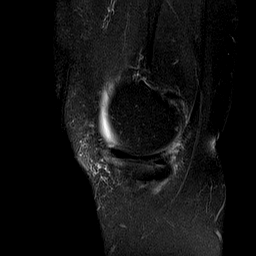
[im 25/25]
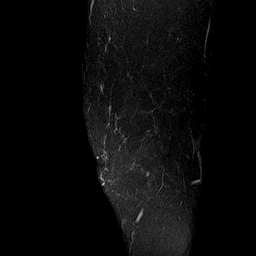

[Series 7: t2_cor_fs · coronal · 4.0mm · 0.44mm/px · 7 of 22 slices shown]
[im 1/22]
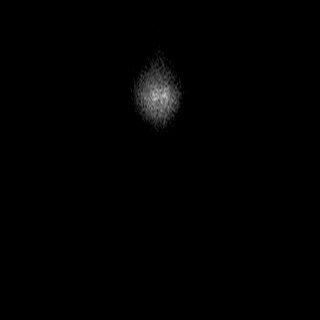
[im 4/22]
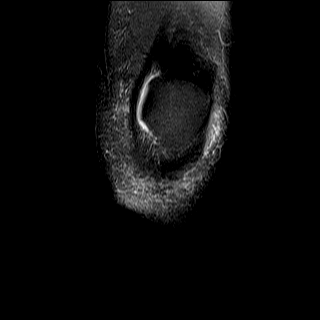
[im 8/22]
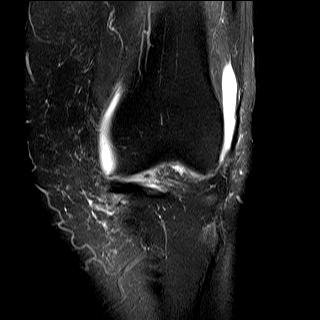
[im 11/22]
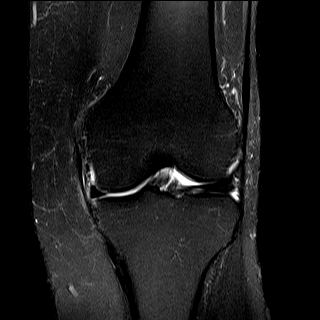
[im 15/22]
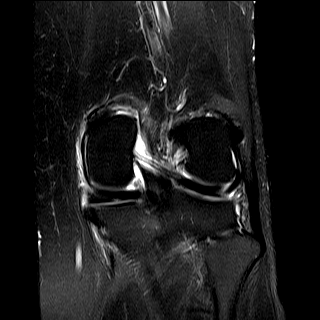
[im 18/22]
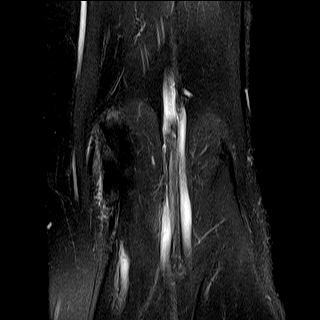
[im 22/22]
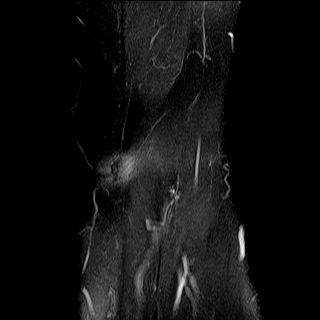

[40 of 40 positions shown; findings below may reference images not displayed]

FINDINGS: OSSEOUS: No acute fracture, avascular necrosis or aggressive osseous lesion.

MEDIAL JOINT COMPARTMENT:

Medial meniscus: Horizontal tear of the posterior horn extending into the body.

Articular cartilage: Small region of deep partial-thickness chondrosis along the central portion of the weightbearing medial femoral condyle, without underlying reactive osseous change.

LATERAL JOINT COMPARTMENT:

Lateral meniscus: Myxoid degeneration of the body with a questionable small radial tear along the anterior body/horn junction (series 5, image 7).

Articular cartilage: Mild scattered chondral thinning and irregularity of the lateral compartment articular cartilage.

PATELLOFEMORAL JOINT AND EXTENSOR MECHANISM:

Quadriceps tendon: The visualized portion of the distal quadriceps tendon is intact.

Patella tendon: Intact.

Articular cartilage: Scattered superficial partial-thickness chondrosis of the trochlear groove. Mild chondral thinning of the superior aspect of the median patellar ridge.

Alignment: The alignment of the patellofemoral joint is within normal limits, in the imaged position.

LIGAMENTS:

Anterior cruciate ligament: Intact with mild mucoid degeneration.

Posterior cruciate ligament: Intact.

Medial collateral ligament: Intact.

Lateral collateral ligament: Intact.

MUSCULOTENDINOUS: The visualized musculotendinous soft tissues about the knee are unremarkable.

OTHER: Moderate knee joint effusion. Trace fluid is present within the popliteal recess.
IMPRESSION: 1.
Horizontal tear of the posterior horn extending into the body of the medial meniscus. Mild background medial compartment chondrosis.

2.
Myxoid degeneration of the body of the lateral meniscus with questionable small radial tear along the anterior body/horn junction. Mild lateral compartment chondrosis.

3.
Mild patellofemoral joint chondrosis.

4.
Moderate knee joint effusion.

## 2021-12-16 IMAGING — MG MAMMO SCRN BIL W/CAD TOMO
8 series · 8 of 24 positions shown · non-contrast
Comparison: The present examination has been compared to prior imaging studies.

Images Obtained from [facility]
INDICATION: Screening.
TECHNIQUE: Bilateral 2-D digital screening mammogram was performed followed by 3-D tomosynthesis.  Current study was also evaluated with a computer aided detection (CAD) system.
MAMMOGRAM FINDINGS:
The breasts are almost entirely fatty.
No suspicious abnormality is seen in either breast.  There are no significant changes from the prior study.

[R MLO]
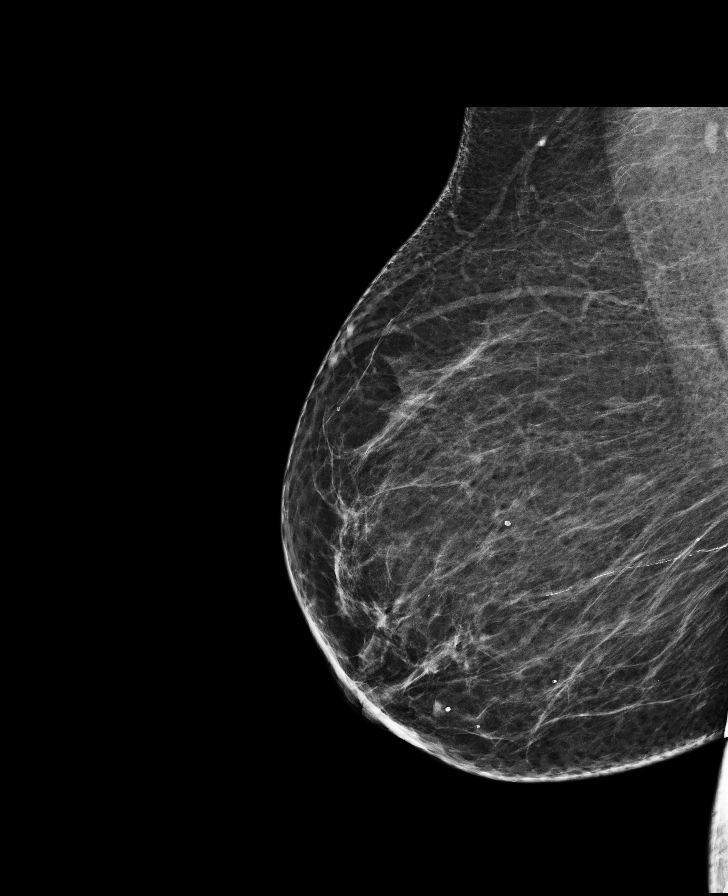

[L CC]
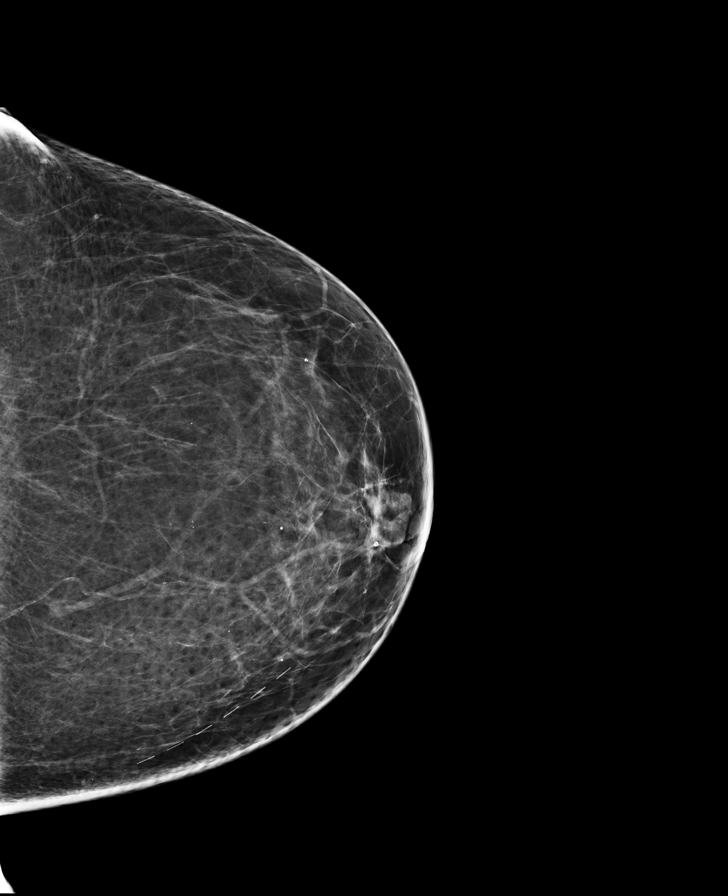

[L MLO]
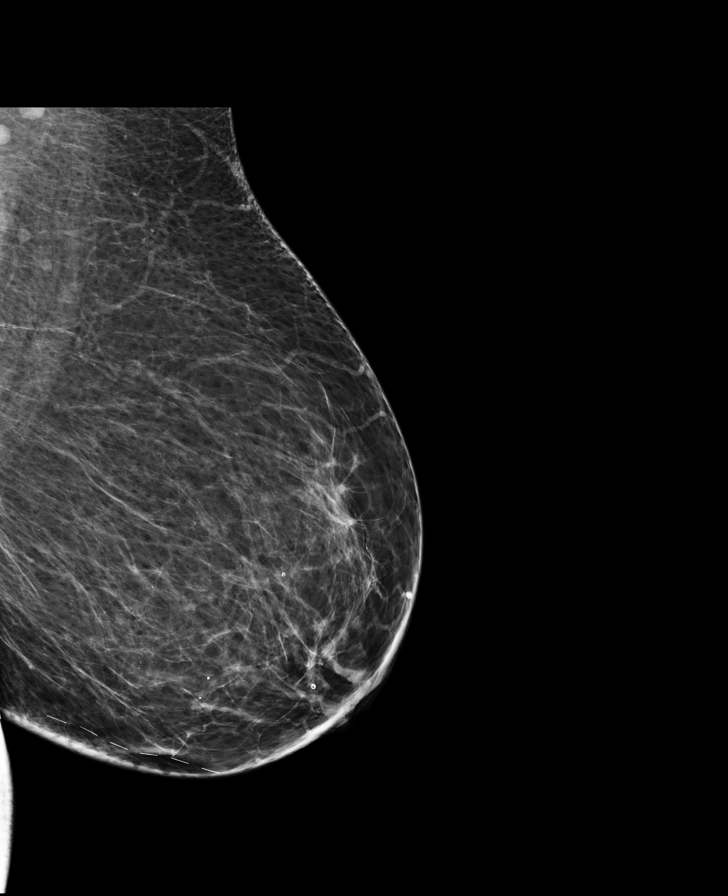

[R CC]
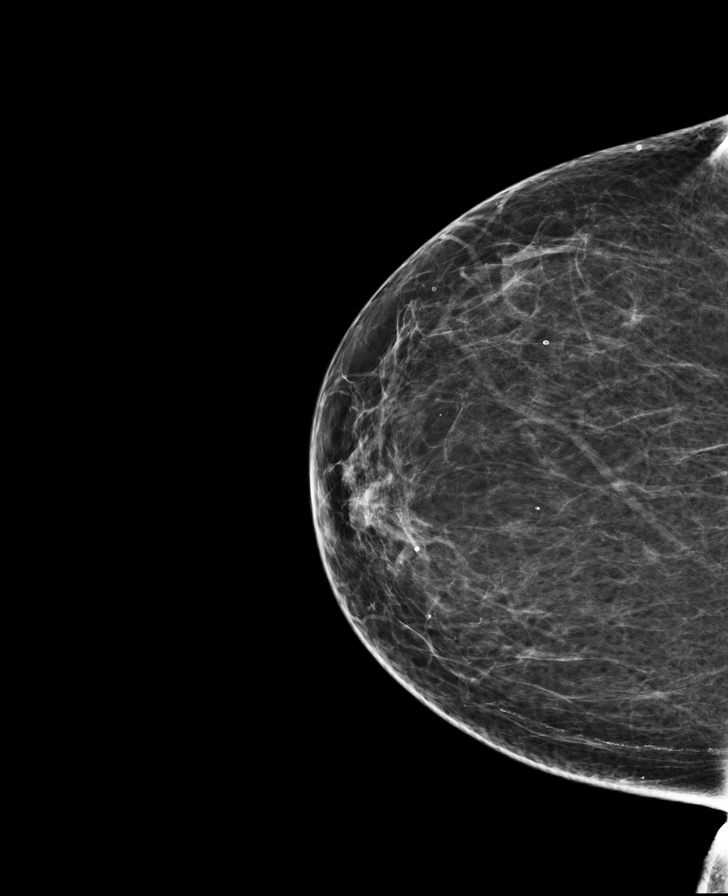

[R MLO tomo · tomo slice 41/81.0]
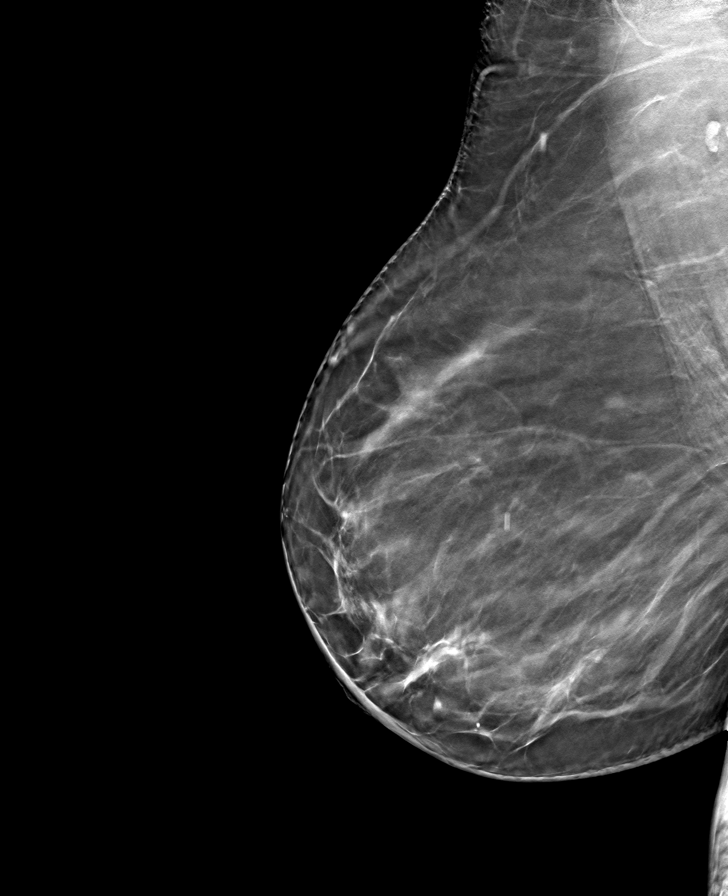

[L MLO tomo · tomo slice 41/81.0]
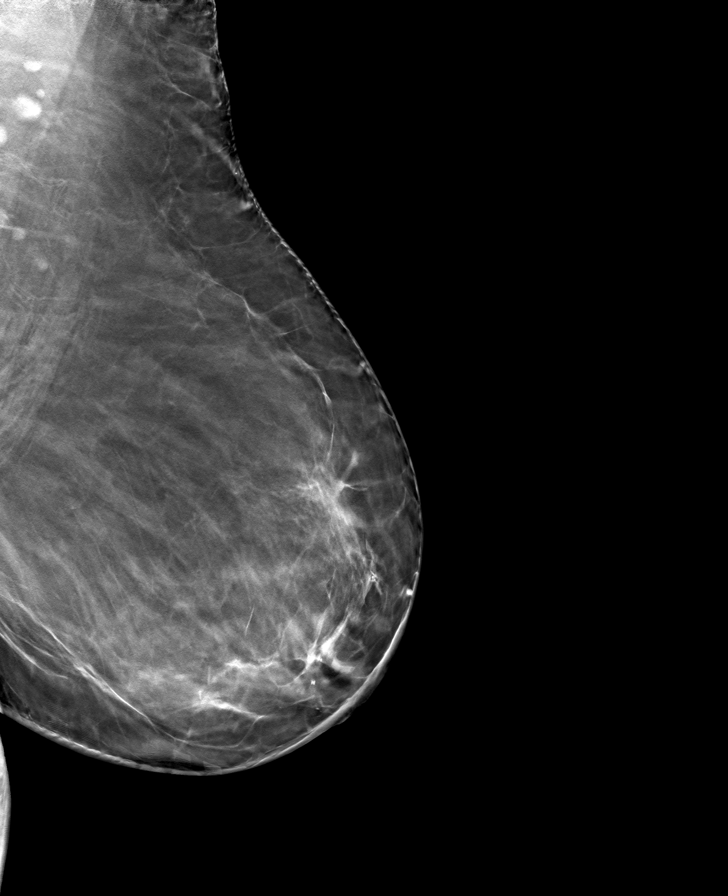

[L CC tomo · tomo slice 39/76.0]
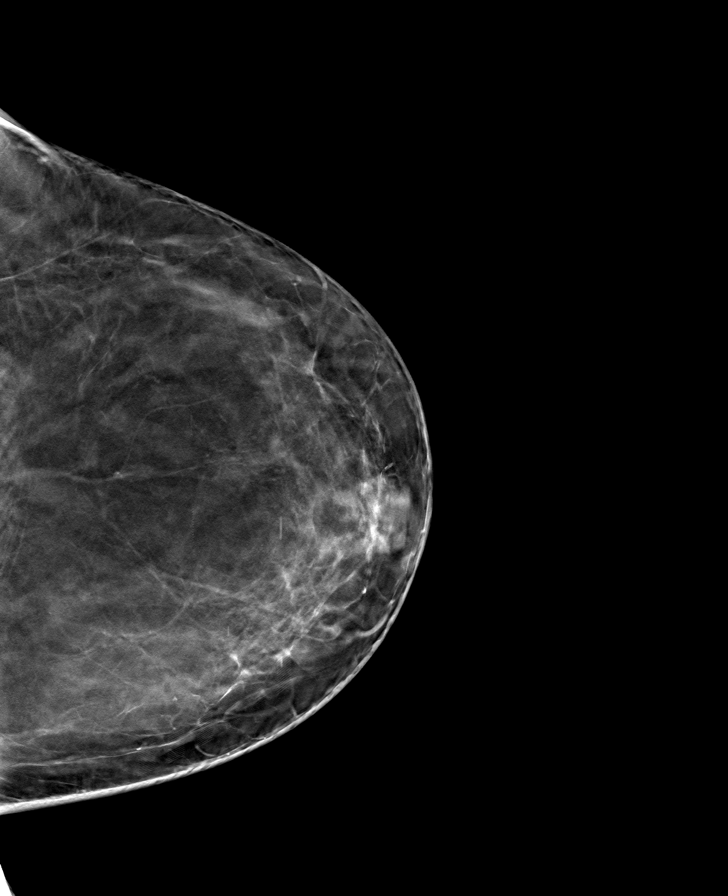

[R CC tomo · tomo slice 37/74.0]
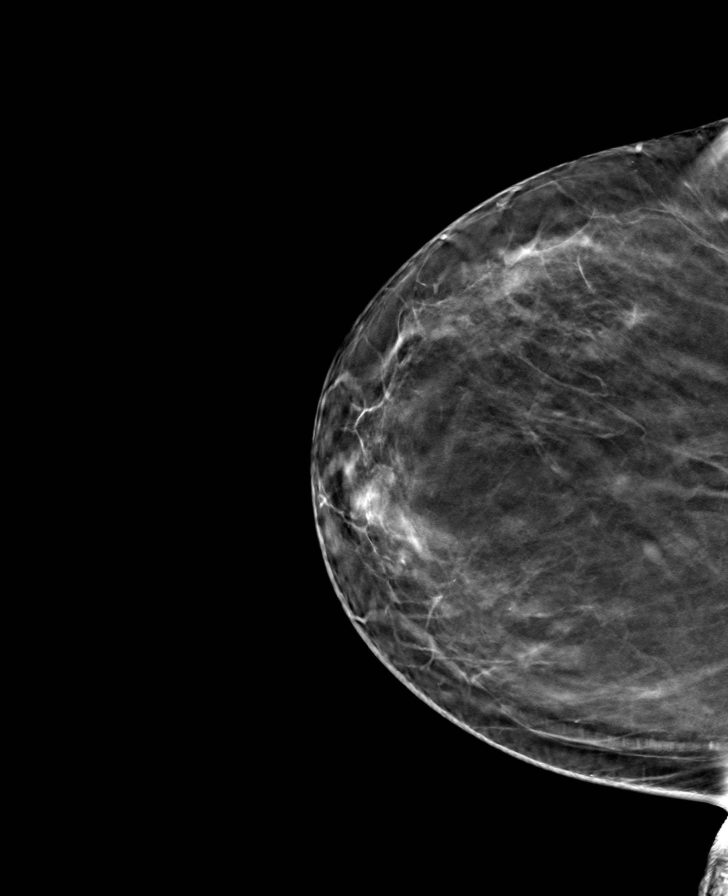

[8 of 24 positions shown; findings below may reference images not displayed]

IMPRESSION: There is no mammographic evidence of malignancy.
Screening mammogram recommended in 1 year.
BI-RADS Category 1: Negative

## 2022-03-18 IMAGING — OT DXA BONE DENSITY
3 series · 3 of 3 positions shown · non-contrast
Comparison: none

Images Obtained from [facility]
REASON FOR EXAM: Postmenopausal osteoporosis screening.
RISK FACTORS:  Diabetes mellitus treated with oral medication increases the risk for secondary osteoporosis.
PRIOR EXAMS:  DXA from [HOSPITAL] dated 02/16/2020.
METHOD:  Scans of the spine, hip, and forearm were performed using dual energy X-ray densitometry (DXA)

[Series 1: — · left · 1 of 1 slices shown (1 of 3)]
[im 1/1]
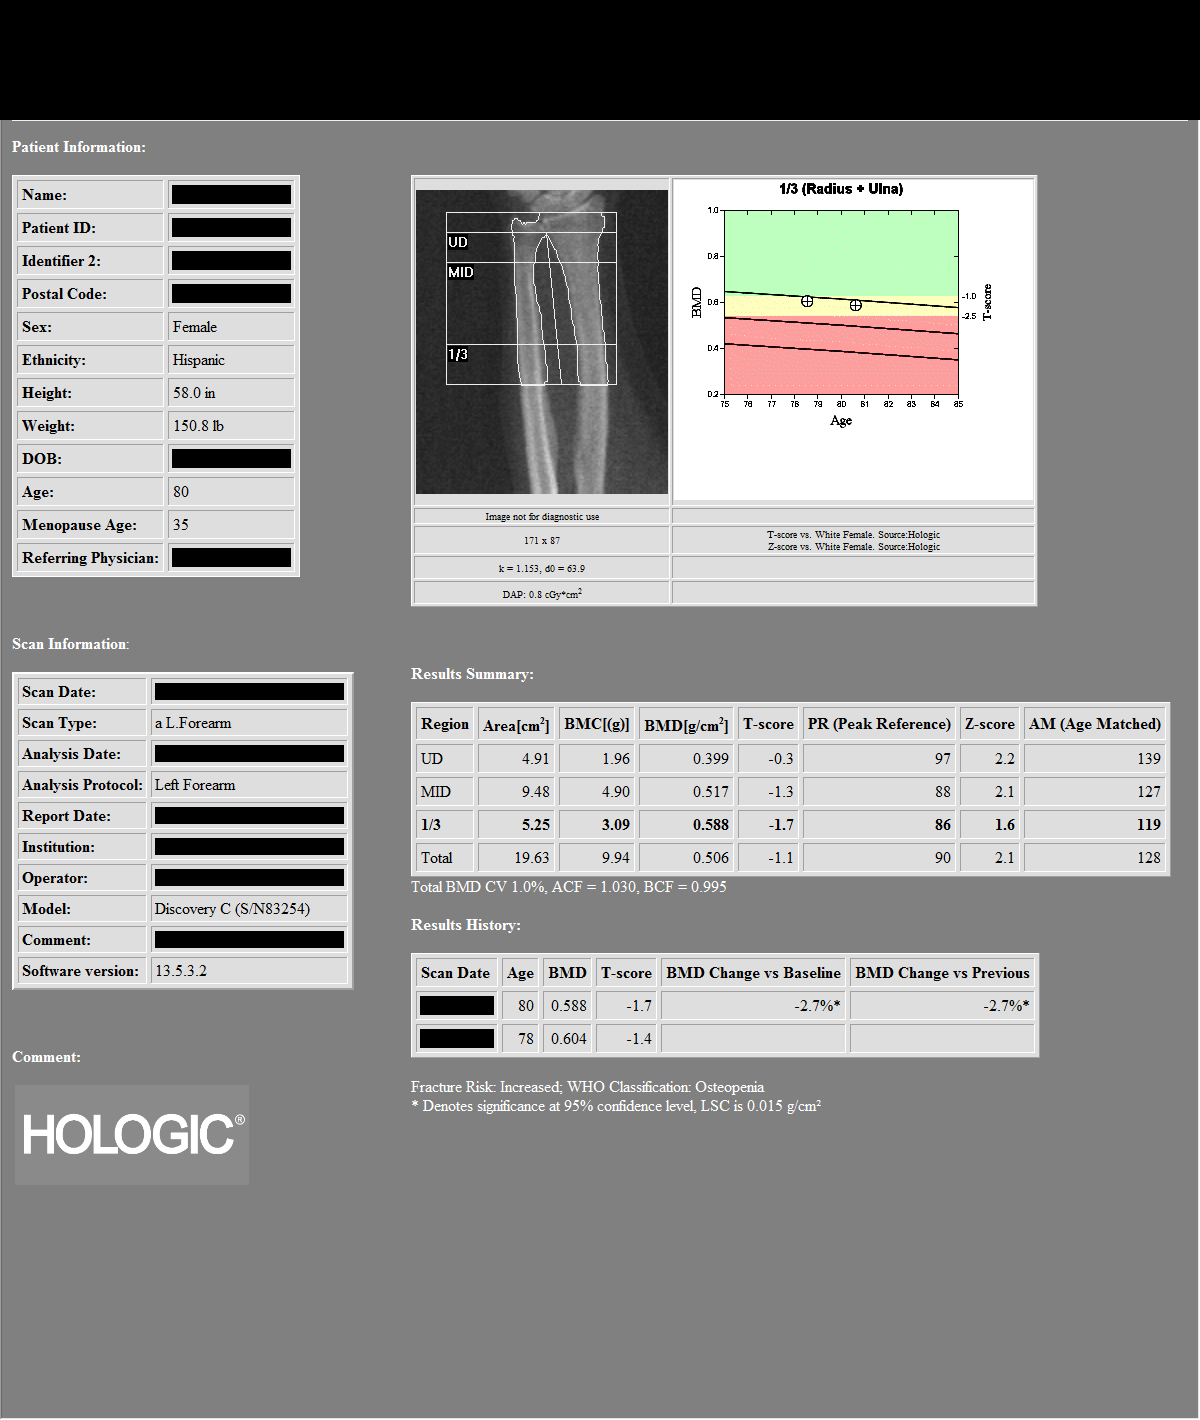

[Series 2: — · 1 of 1 slices shown (2 of 3)]
[im 1/1]
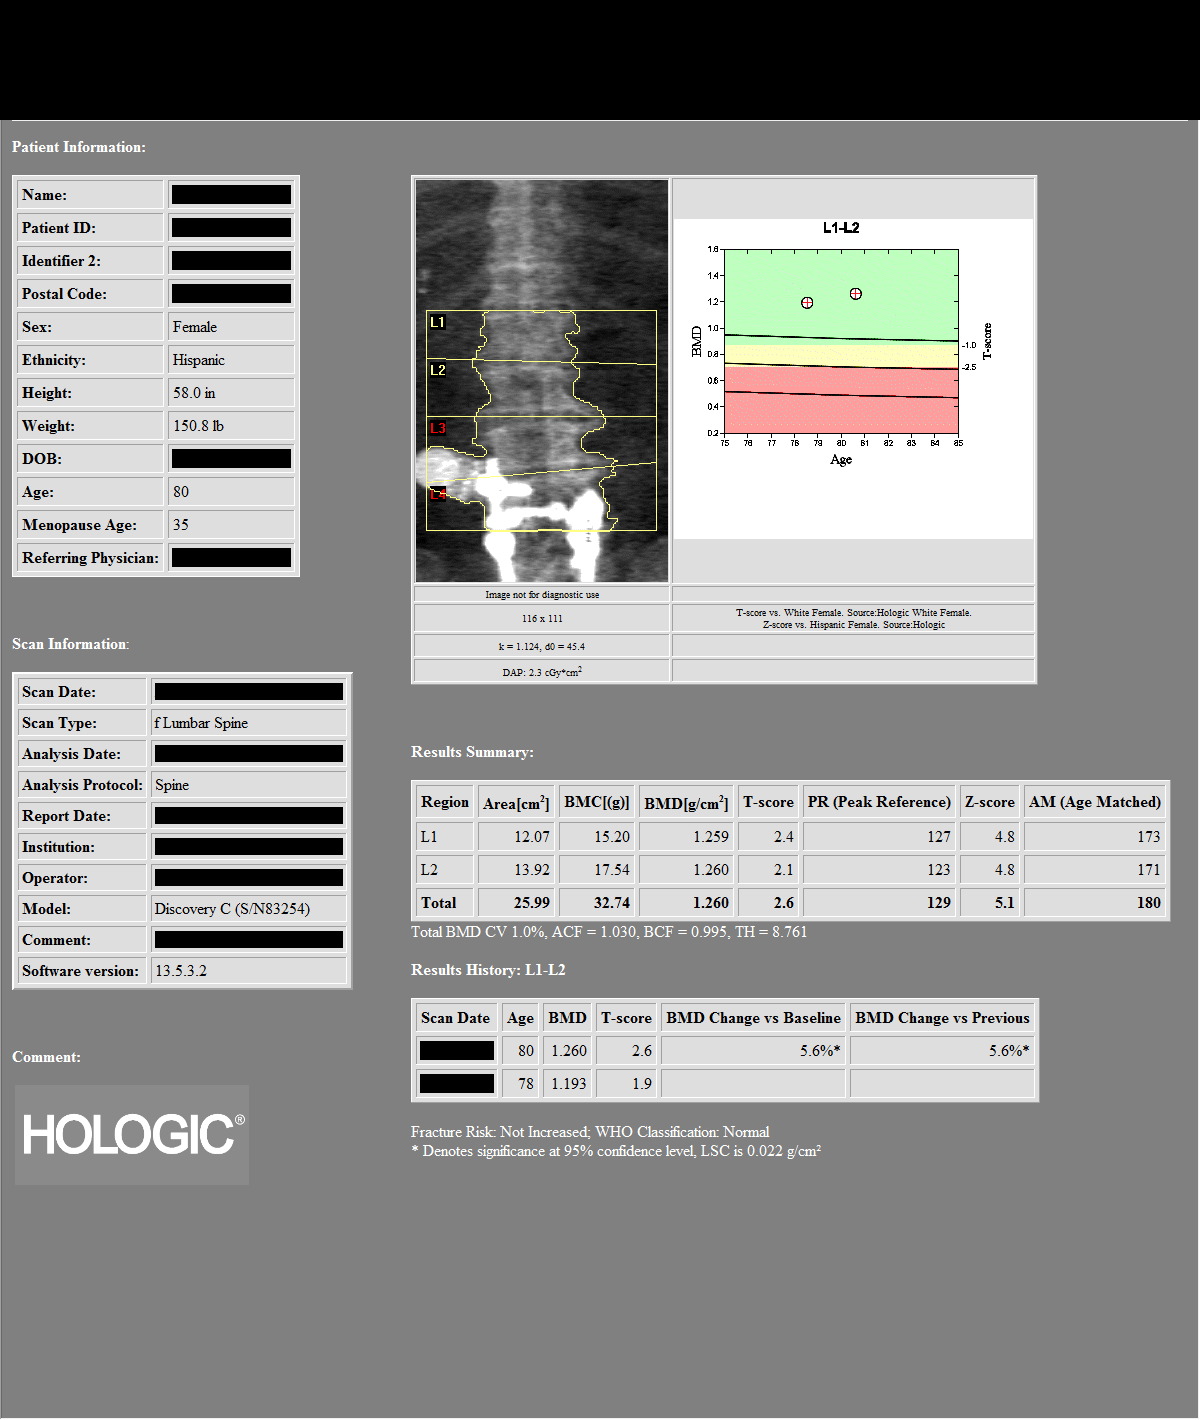

[Series 3: — · left · 1 of 1 slices shown (3 of 3)]
[im 1/1]
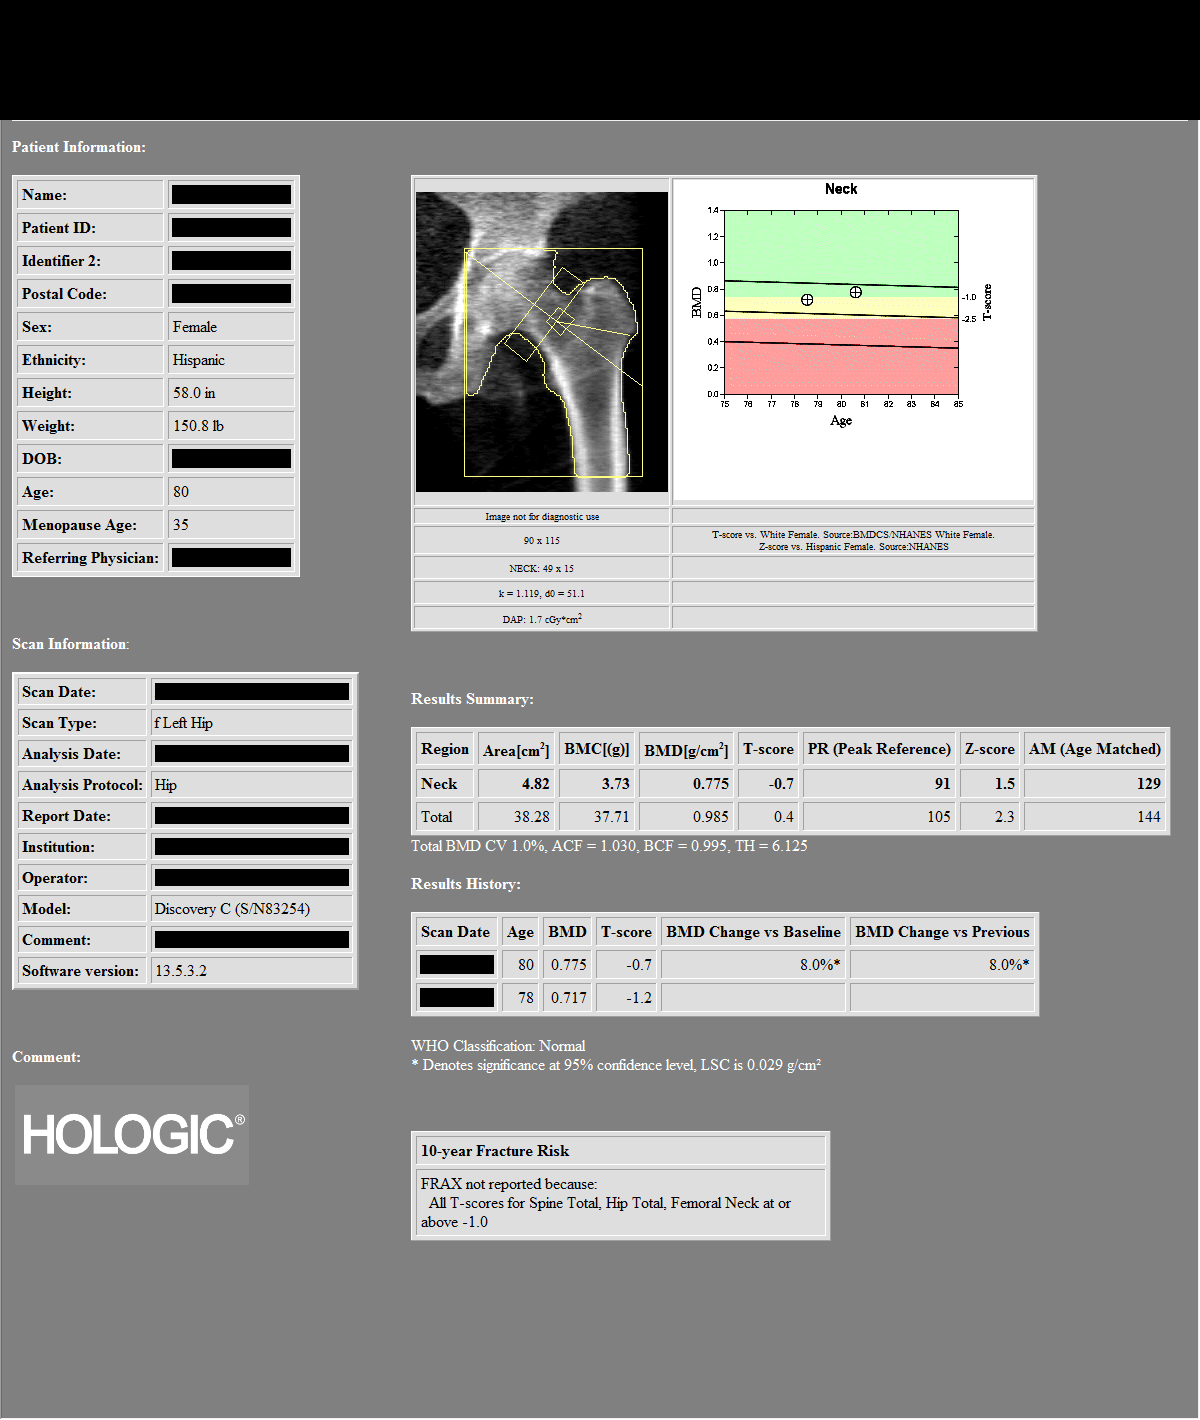

[3 of 3 positions shown; findings below may reference images not displayed]

IMPRESSION: As defined by World Health Organization, the patient meets the criteria for OSTEOPENIA based on forearm T-score.
PATIENT DEMOGRAPHICS:  80-year-old Hispanic female.
FINDINGS: 1.    Review of scanogram images shows orthopedic metallic hardware at L3 and L4 that precludes their analysis with the spine. Osseous spurring at endplates increases the T-scores for L1 and L2 and
forearm is added for an additional point of evaluation.
2.    The lumbar spine exam using L1-L2 regions shows average Bone Mineral Density is 1.260 gm/cm2 of Hydroxyapatite.  The T-score (comparing patient with a young adult group) is 2.6 standard
deviations ABOVE mean. The Z-score (comparing patient with an age-matched group) is 5.1 standard deviations ABOVE mean.
COMPARED TO PRIOR DXA, spine bone density was 1.193 gm/cm2.  This is an interval increase of 0.067 gm/cm2 or 5.6 %. Technologist least significant change in the spine is 0.030 gm/cm2. This is a
statistically significant interval increase.
3.  The left hip exam using femoral neck region of interest shows average Bone Mineral Density is 0.775 gm/cm2 of Hydroxyapatite. The T-score (comparing patient with a young adult group) is
standard deviations BELOW mean. The Z-score (comparing patient with an age-matched group) is 1.5 standard deviations ABOVE mean.
COMPARED TO PRIOR DXA, femoral neck bone density was 0.717 gm/cm2.  This is an interval increase of 0.058 gm/cm2 or 8.0 %. Technologist least significant change in the hip is 0.032 gm/cm2. This is a
statistically significant interval increase.
4.   The left forearm exam using [DATE] radius region of interest shows average Bone Mineral Density is 0.588 gm/cm2 of Hydroxyapatite. The T-score (comparing patient with a young adult group) is
standard deviations BELOW mean. The Z-score (comparing patient with an age-matched group) is 1.6 standard deviations ABOVE mean.
COMPARED TO PRIOR DXA, forearm bone density was 0.604 gm/cm2.  This is an interval decrease of 0.016 gm/cm2 or -2.7 %. Least significant change in the forearm is 0.015 gm/cm2.  This is a
statistically significant interval decrease.
FRAX data is not provided due to normal T-scores for spine and hip.
RECOMMENDATIONS:  The patient states that she is taking vitamin D supplements on a regular basis.  The patient should continue being a nonsmoker and  REGULAR EXERCISE TO PATIENT TOLERANCE WOULD BE OF
BENEFIT. The patient is currently not taking prescribed medication for prevention of bone loss.  According to criteria established by the national osteoporosis foundation, THE PATIENT DOES NOT MEET
THE CURRENT INDICATIONS FOR PRESCRIBED MEDICAL THERAPY. The National Osteoporosis Foundation now recommends followup DXA scanning every two years in patients at risk regardless of whether the patient
is undergoing pharmacological treatment.

## 2022-03-18 IMAGING — US US LIVER
1 series · 14 of 25 positions shown · non-contrast
Comparison: Liver ultrasound 09/10/2021.

Images Obtained from [facility]
REASON FOR EXAM: Cirrhosis of the liver.
TECHNIQUE: Gray-scale and color and spectral Doppler ultrasound imaging of the abdomen was performed.

[Series 1: us liver · 14 of 34 slices shown]
[im 1/34]
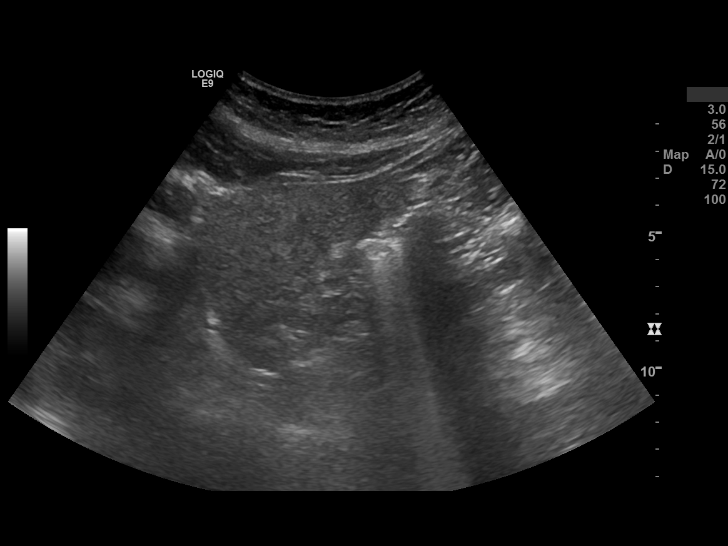
[im 3/34]
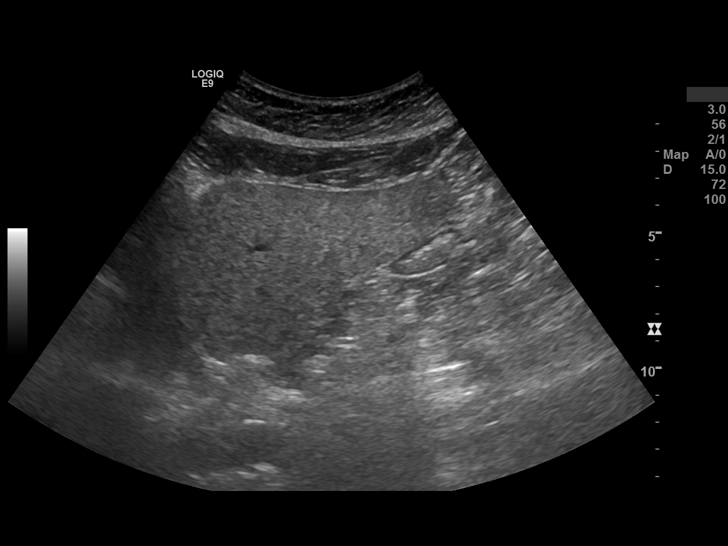
[im 6/34]
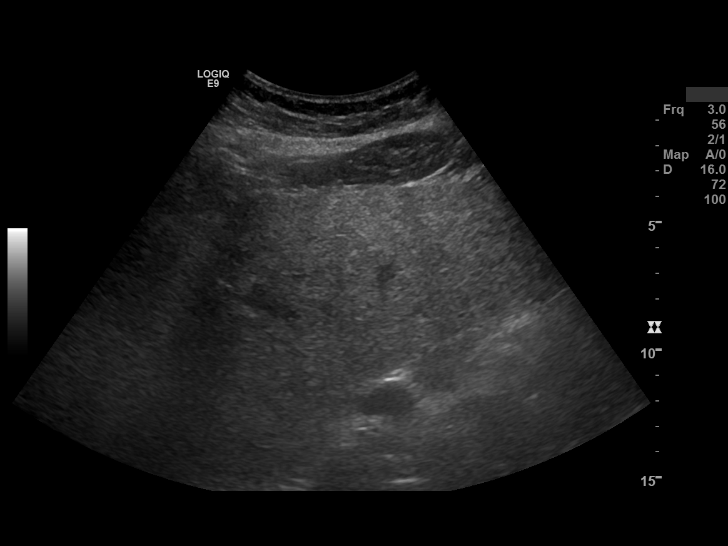
[im 9/34]
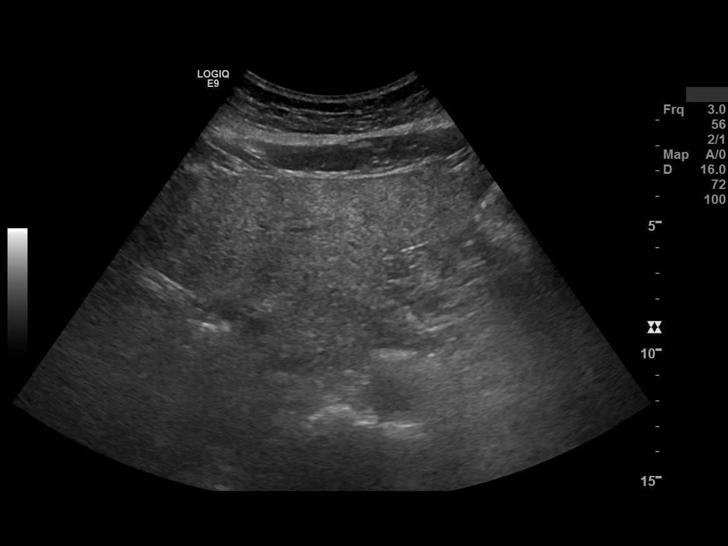
[im 12/34]
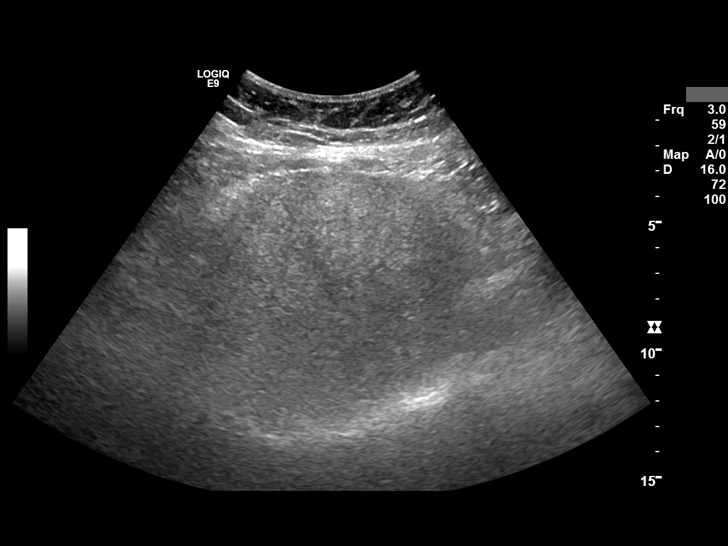
[im 13/34]
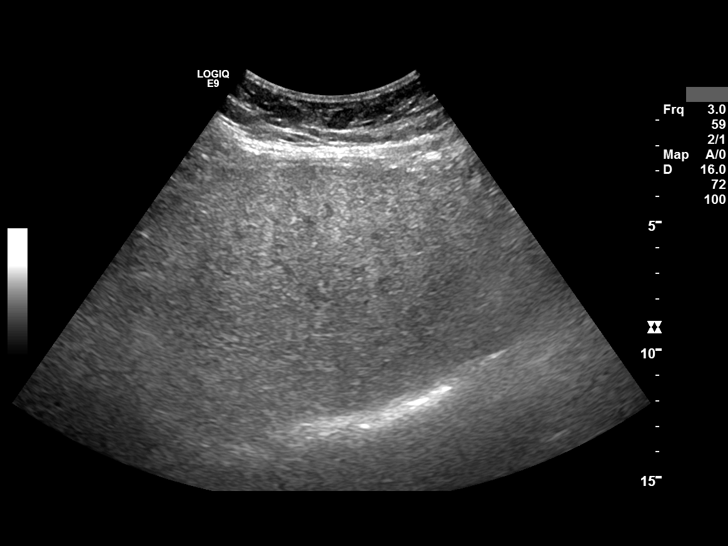
[im 16/34]
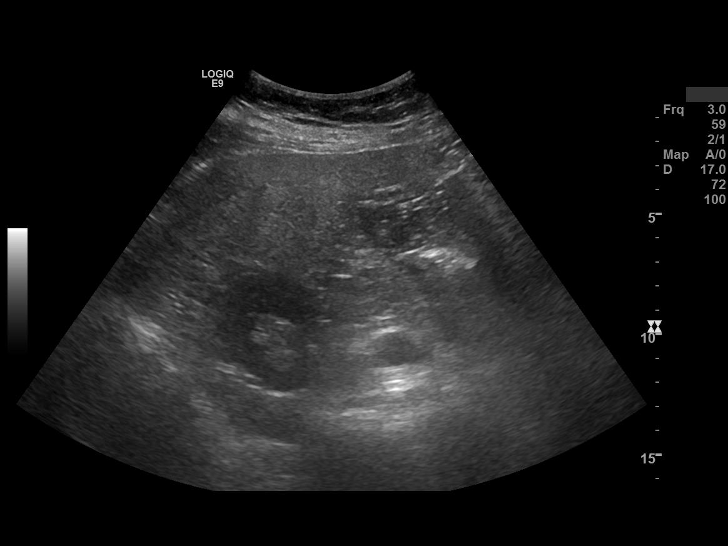
[im 18/34]
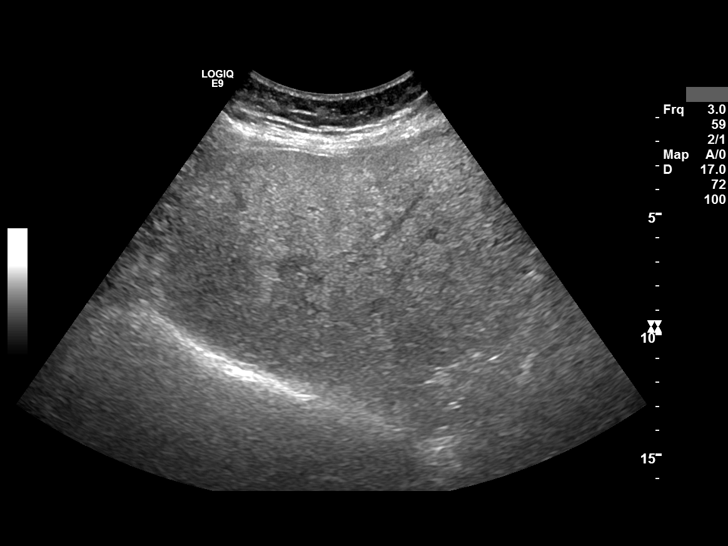
[im 21/34]
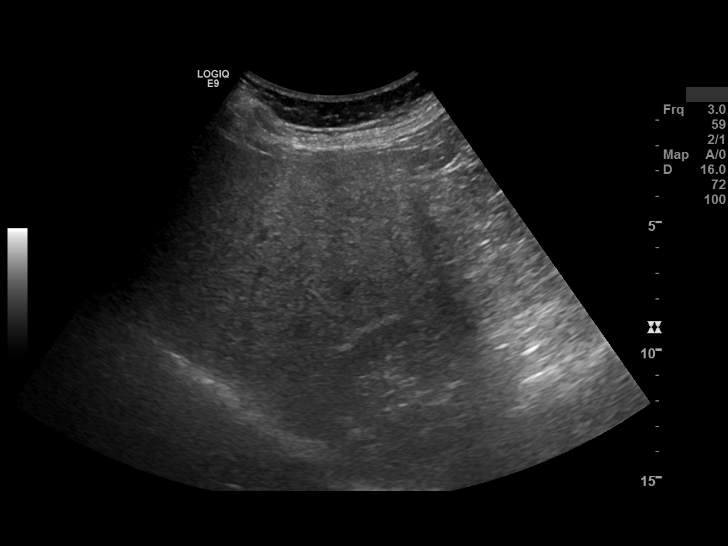
[im 23/34]
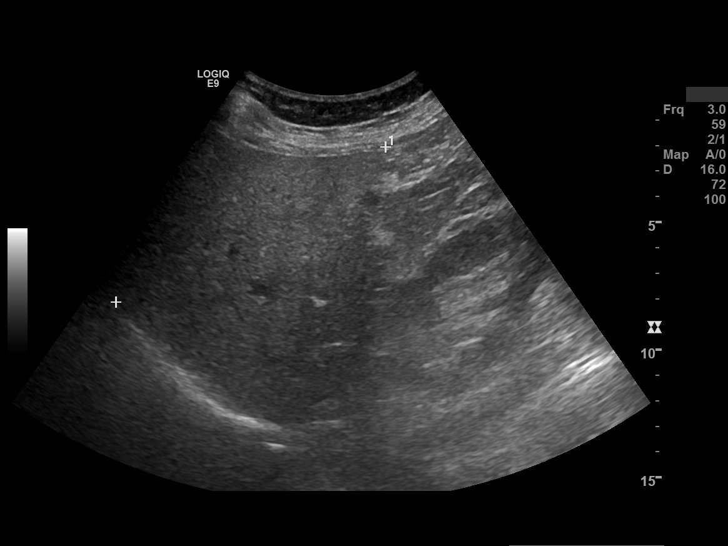
[im 25/34]
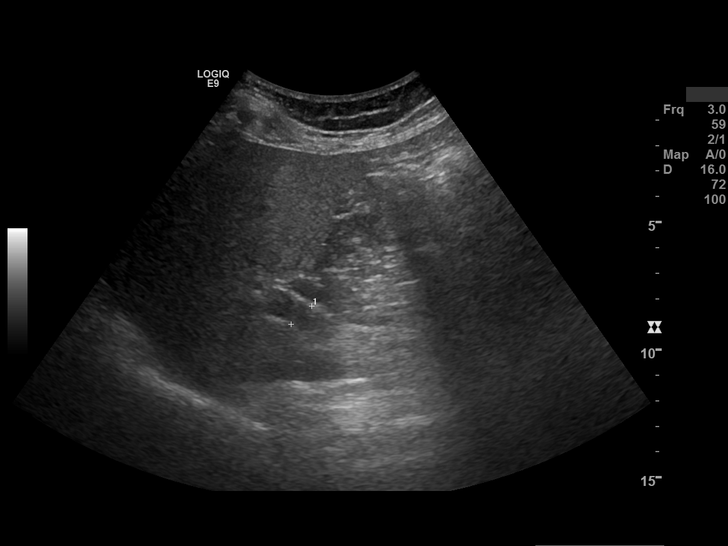
[im 28/34]
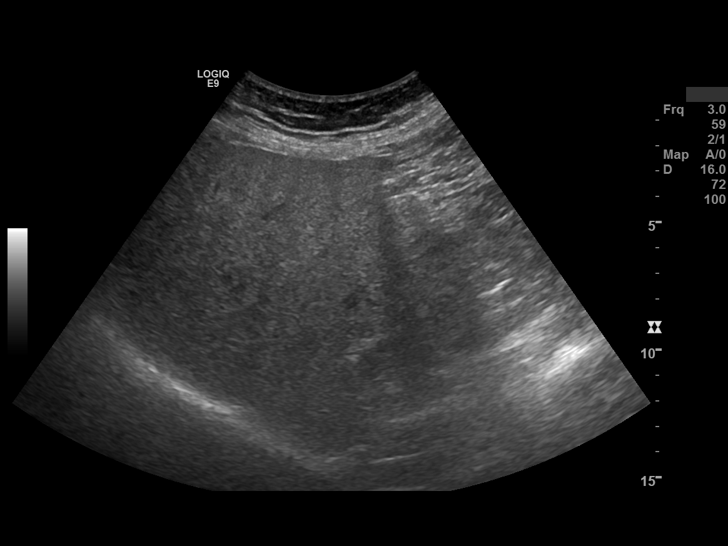
[im 31/34]
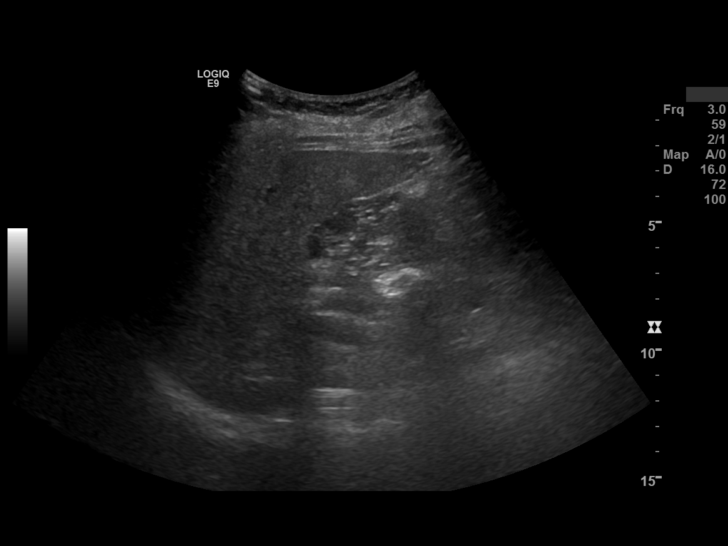
[im 34/34]
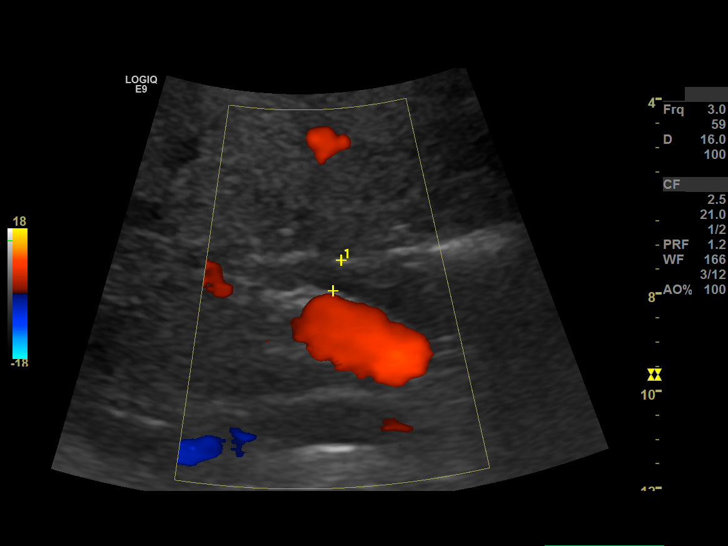

[14 of 25 positions shown; findings below may reference images not displayed]

FINDINGS: Image quality: Degraded by body habitus and/or bowel gas artifact.
LIVER:
*  Size: Normal in size, measuring 122 mm in length.
*  Parenchymal Echogenicity: Increased
*  Parenchymal Echotexture: Coarsened.
*  Surface: Smooth.
*  Focal Hepatic Lesions: None..
*  Portal Vein: Patent with appropriate direction of flow and normal spectral waveform.
BILIARY DUCTAL SYSTEM:
*  Common Bile Duct: Measures 6.52 mm in greatest diameter.
GALLBLADDER:
Status post cholecystectomy.
RIGHT KIDNEY:
Partially visualized and is unremarkable.
PERITONEUM:
No free fluid is identified within the provided images.
IMPRESSION: Abnormal hepatic parenchymal echogenicity and echotexture could be related to hepatosteatosis and/or other chronic liver disease.

## 2023-01-14 IMAGING — MG MAMMO SCRN BIL W/CAD TOMO
6 of 10 series · 6 of 26 positions shown · non-contrast
Comparison: The present examination has been compared to prior imaging studies.

Images Obtained from [facility]
INDICATION: Screening.
TECHNIQUE: Bilateral 2-D digital screening mammogram was performed followed by 3-D tomosynthesis.  Current study was also evaluated with a computer aided detection (CAD) system.
MAMMOGRAM FINDINGS:
The breasts are almost entirely fatty.
There is a focal asymmetry in the right breast at 12 o'clock at middle depth.
In the left breast, no suspicious masses, calcifications or other abnormalities are seen.

[L CC]
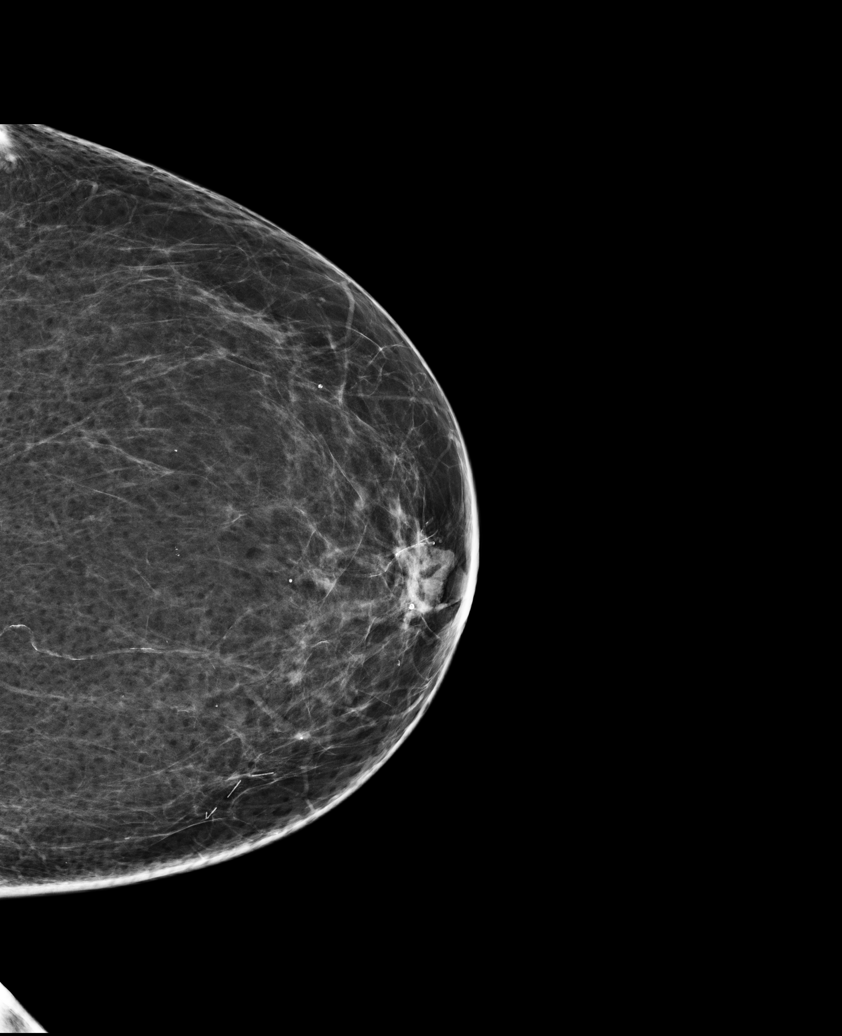

[R MLO (1 of 2)]
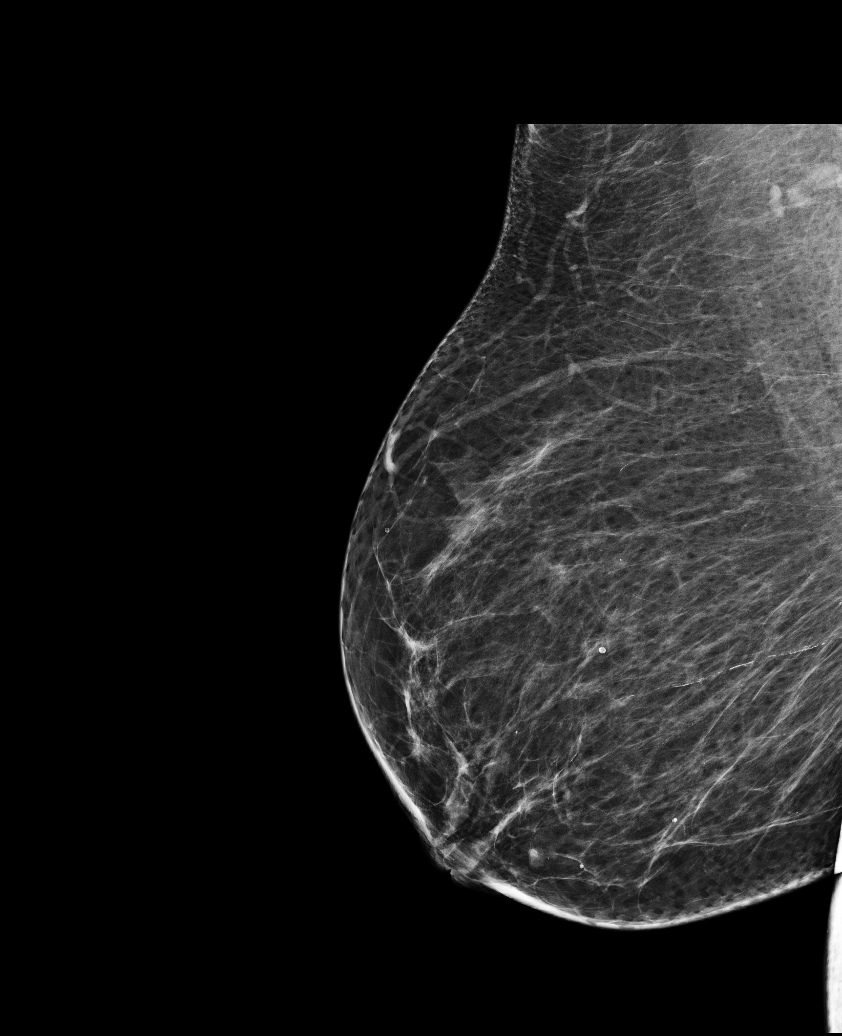

[L MLO]
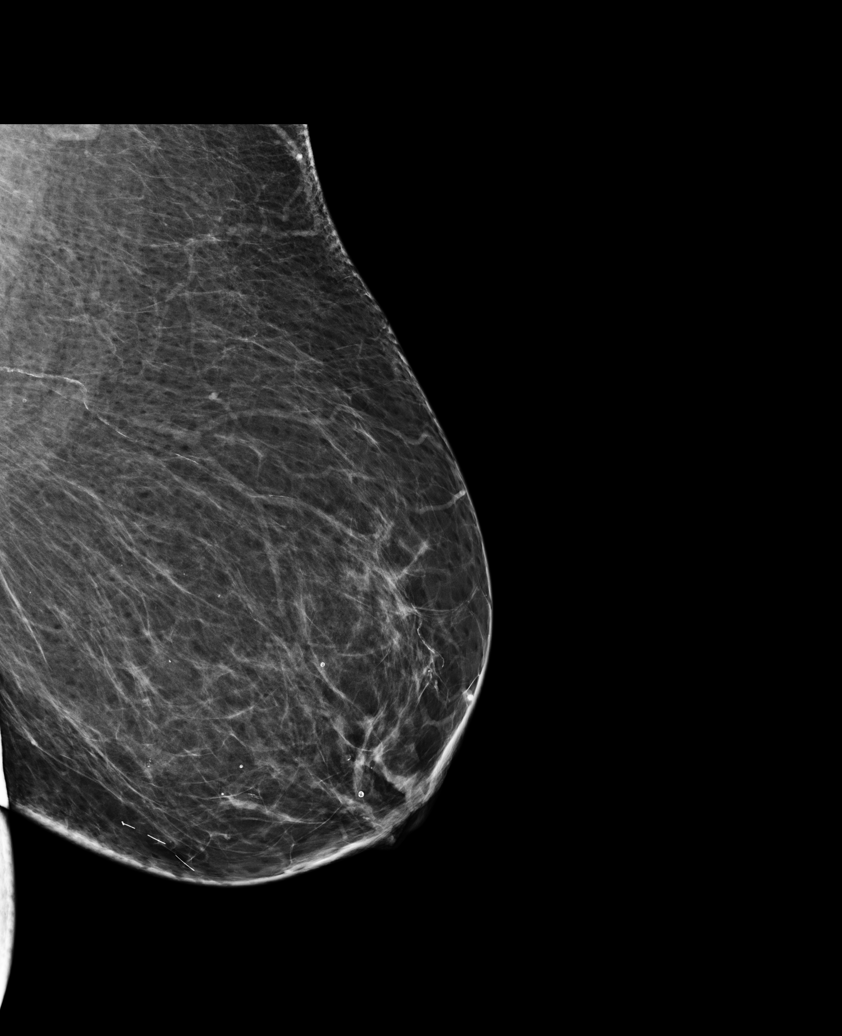

[R CC (1 of 2)]
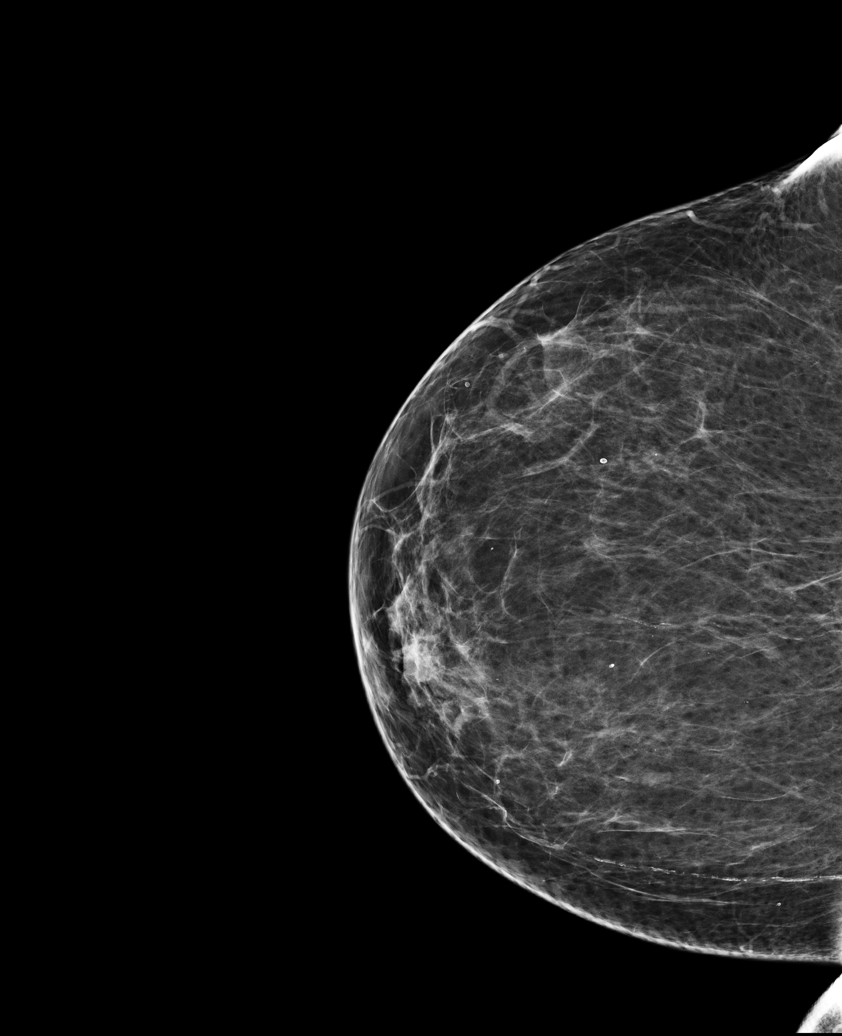

[R CC (2 of 2)]
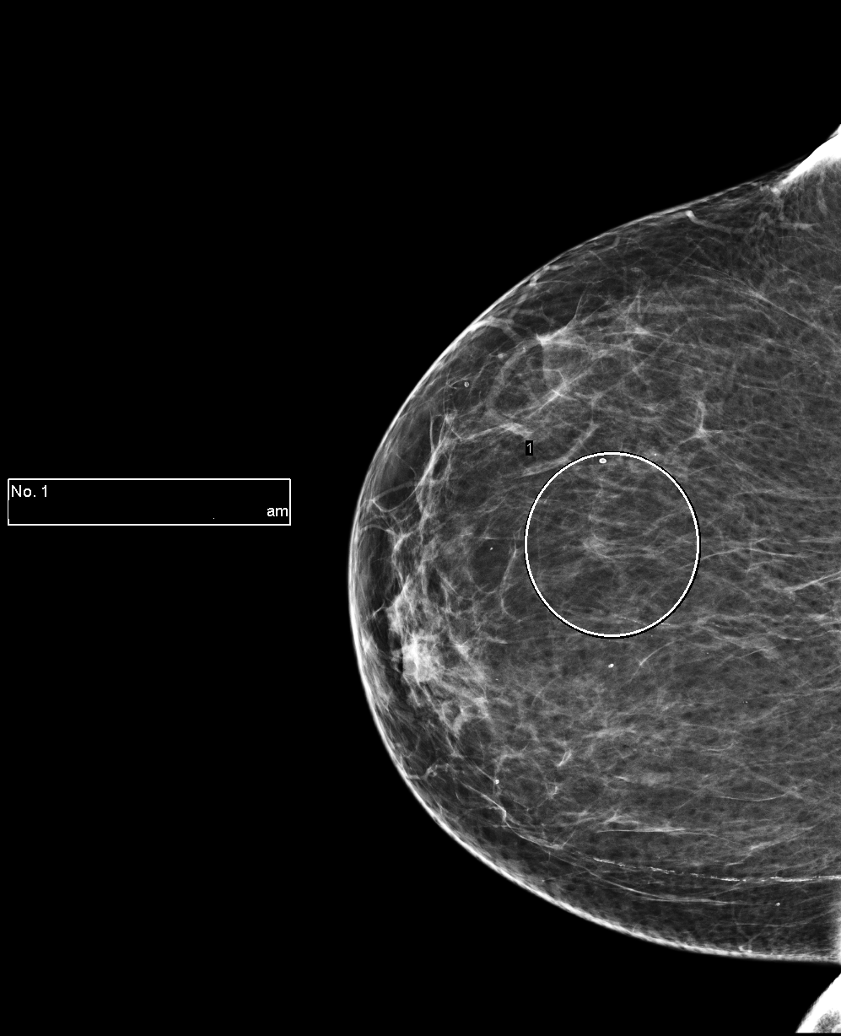

[R MLO (2 of 2)]
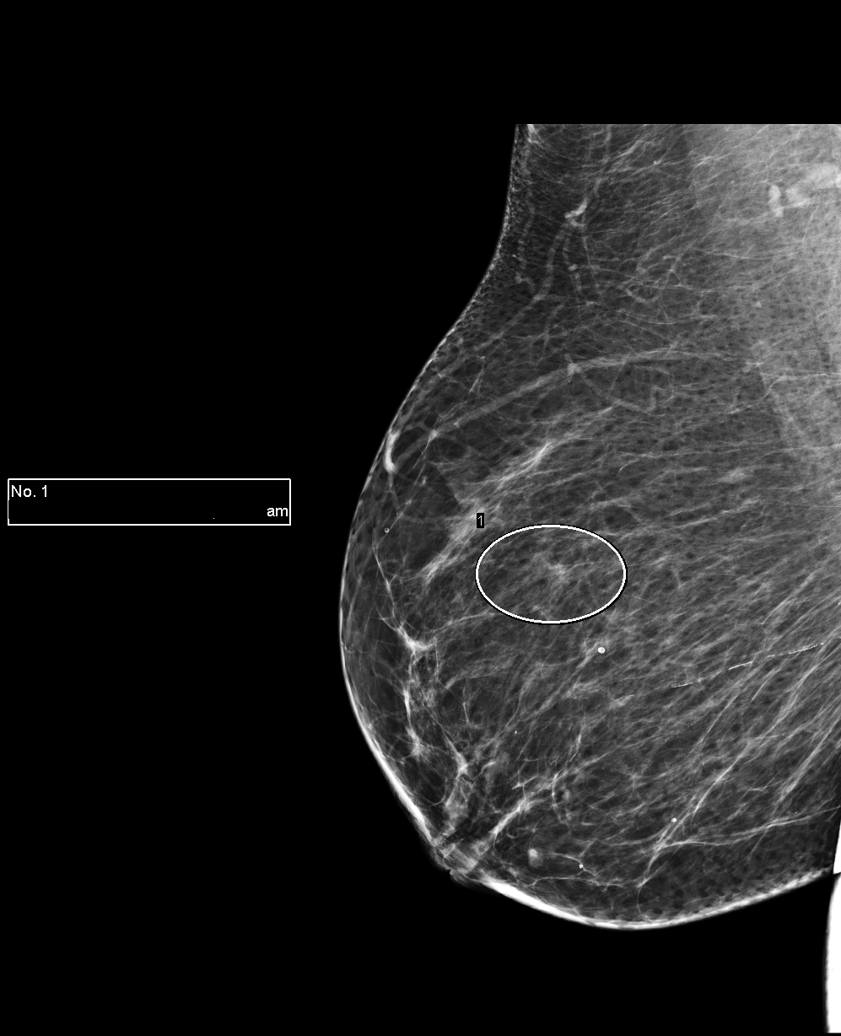

[6 of 26 positions shown; findings below may reference images not displayed]

IMPRESSION: Focal asymmetry in the right breast requires additional evaluation. Diagnostic mammogram with possible ultrasound recommended.
BI-RADS Category 0: Incomplete: Needs Additional Imaging Evaluation

## 2023-02-03 IMAGING — MG MAMMO DIAG RT W/CAD TOMO
6 of 11 series · 6 of 31 positions shown · non-contrast
Comparison: The present examination has been compared to prior imaging studies.

Images Obtained from [facility]
HISTORY: Patient is 81 years old and is seen for diagnostic evaluation of asymmetry in the right breast at 12 o'clock. The patient has a history of left biopsy at age 71 - benign. The patient has no personal
history of cancer. The patient does not have a first degree relative with breast cancer.
TECHNIQUE: Right 2-D digital diagnostic mammogram was performed followed by 3-D tomosynthesis. Real-time targeted ultrasound of the right breast was performed.  Current study was also evaluated with a computer
aided detection (CAD) system.
MAMMOGRAM FINDINGS:
The breast is almost entirely fatty.
The previously seen asymmetry in the right breast at [DATE] is much less conspicuous on the current examination and may well have related to superimposition of fibroglandular densities.
ULTRASOUND FINDINGS:
There is no sonographic correlate. Targeted right breast ultrasound is essentially negative.

[R MLO (1 of 3)]
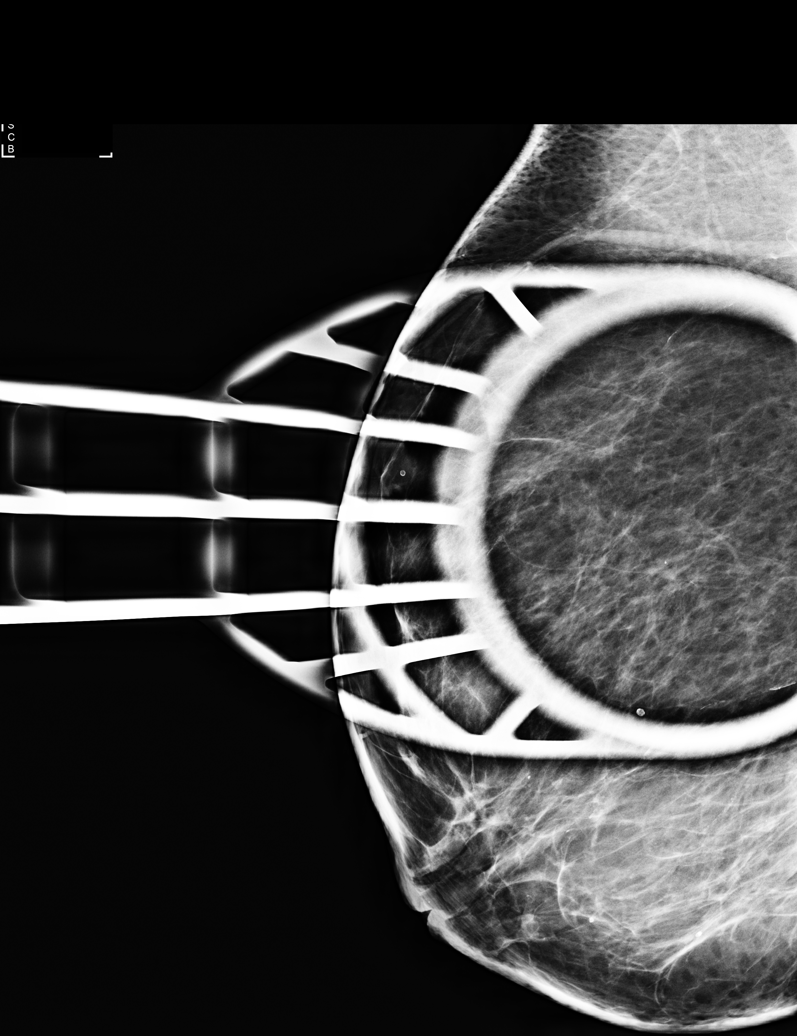

[R CC (1 of 2)]
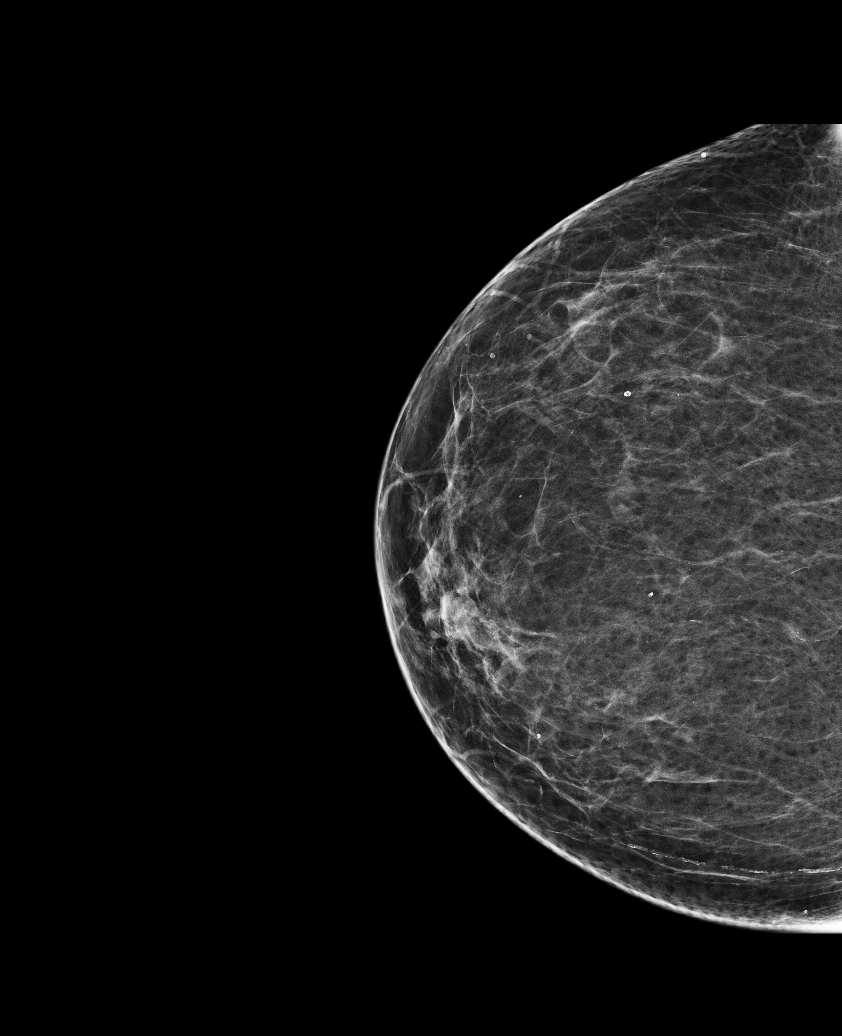

[R MLO (2 of 3)]
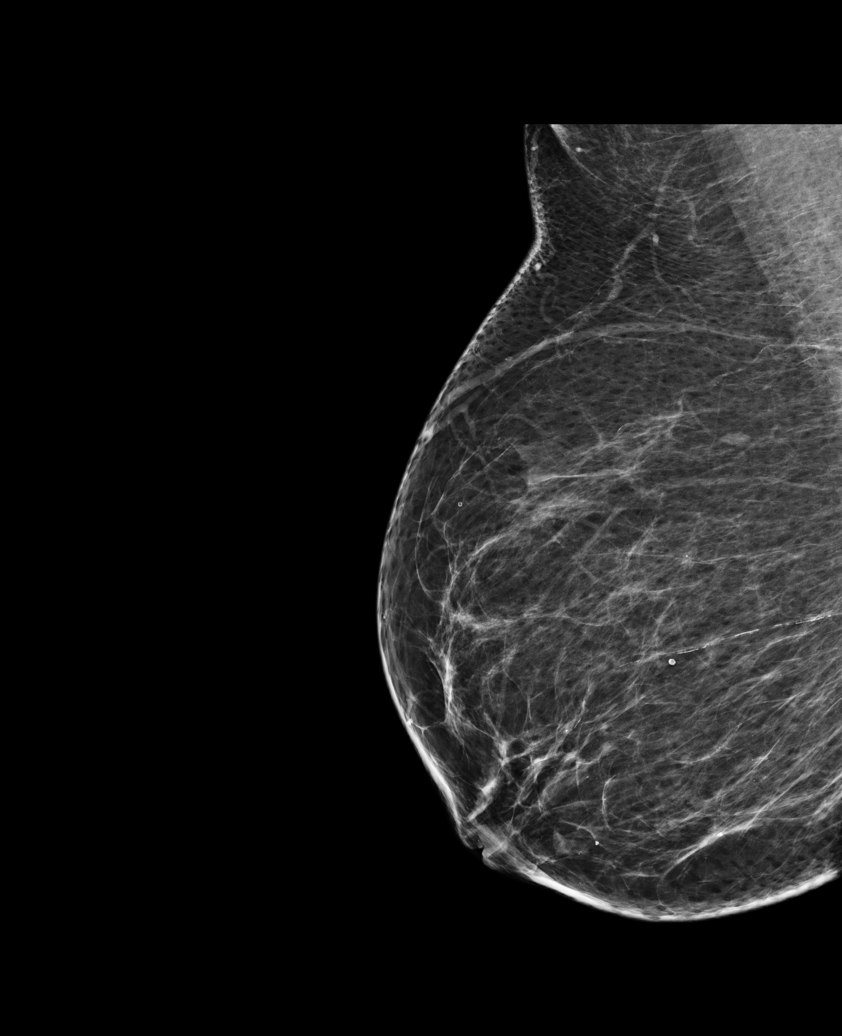

[R LM]
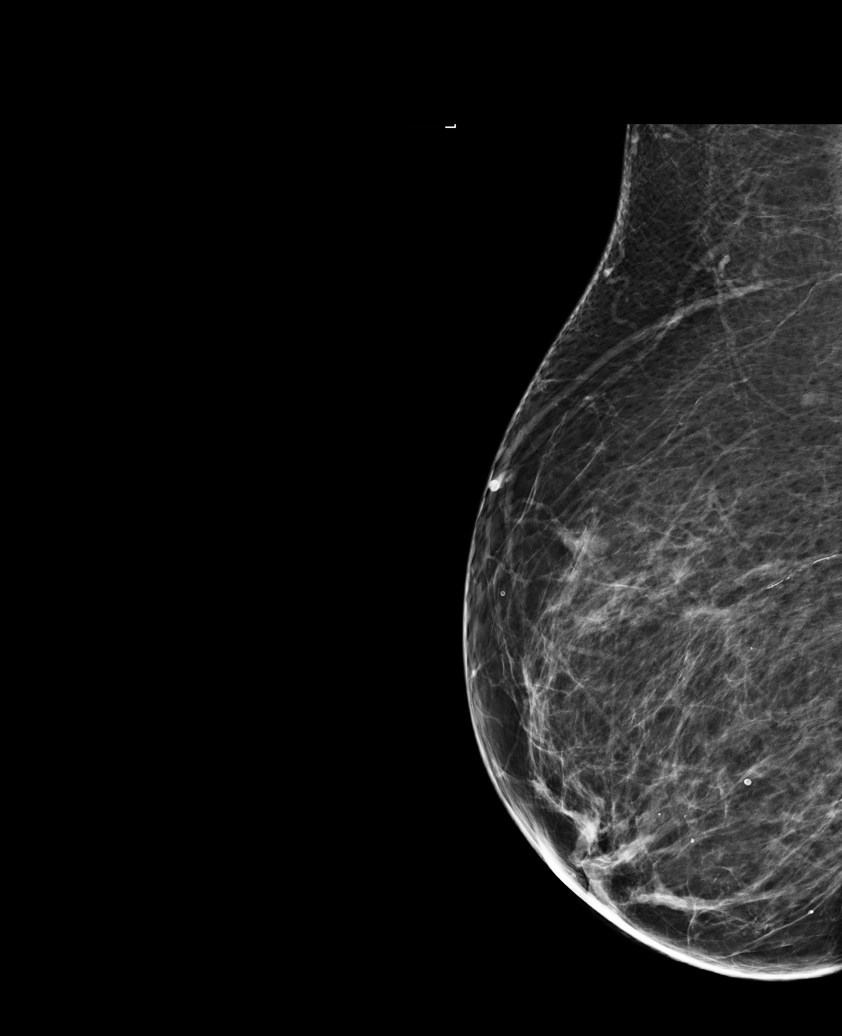

[R CC (2 of 2)]
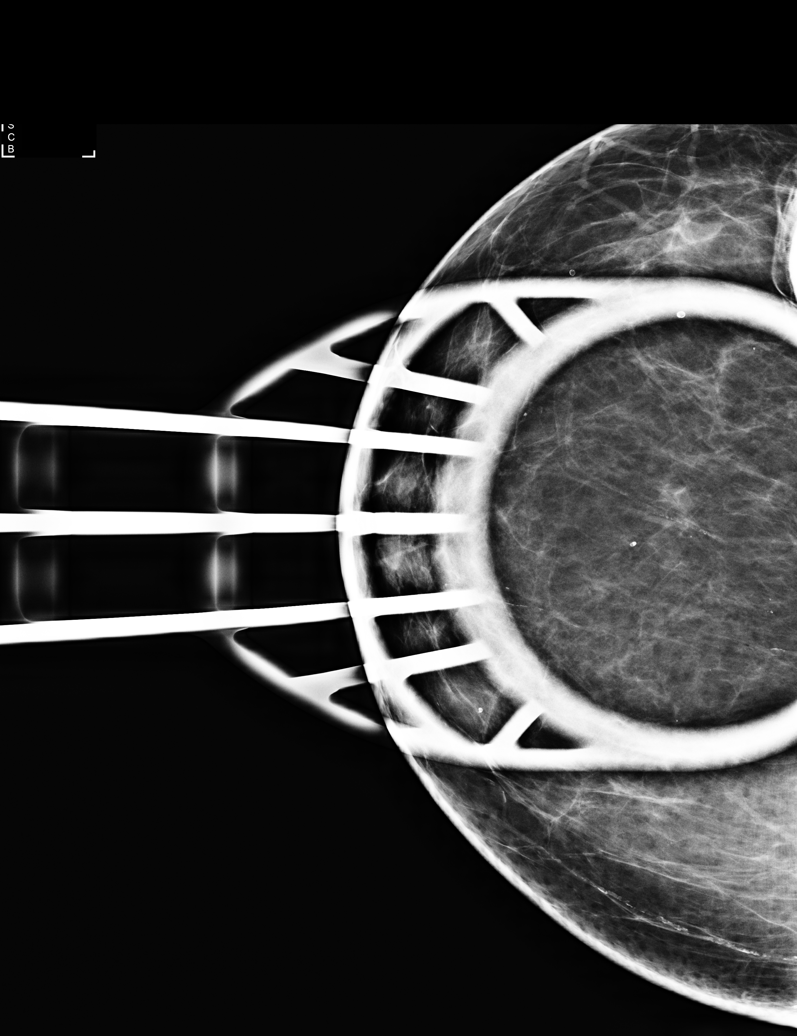

[R MLO (3 of 3)]
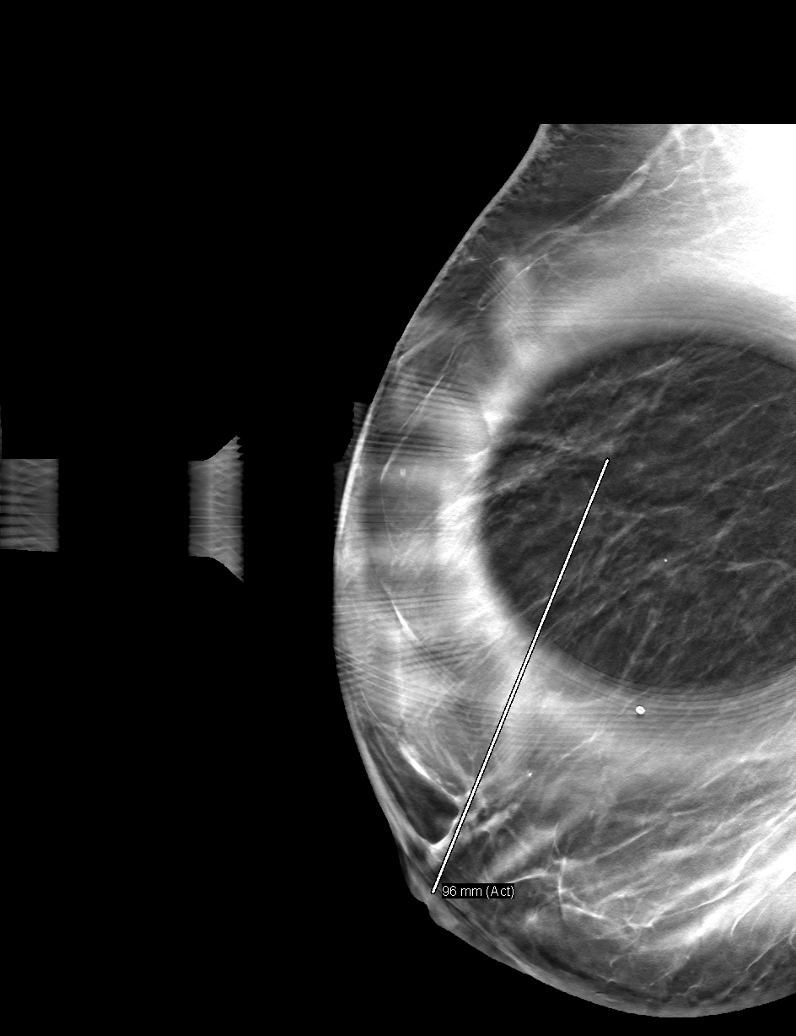

[6 of 31 positions shown; findings below may reference images not displayed]

IMPRESSION: 1.  Previously seen right breast asymmetry is inconspicuous on the additional mammographic views done today and may have related to superimposition of fibroglandular densities.
2.  As a precaution, as a change was questioned, recommend follow-up right breast diagnostic mammogram in 6 months.
3.  Findings were addressed and documented and recommendations were discussed with the patient at the time of the visit. The patient was given a copy of the results.
BI-RADS Category 3: Probably benign

## 2023-02-03 IMAGING — US US BREAST RT LTD
1 series · 14 of 18 positions shown · non-contrast
Comparison: The present examination has been compared to prior imaging studies.

Images Obtained from [facility]
HISTORY: Patient is 81 years old and is seen for diagnostic evaluation of asymmetry in the right breast at 12 o'clock. The patient has a history of left biopsy at age 71 - benign. The patient has no personal
history of cancer. The patient does not have a first degree relative with breast cancer.
TECHNIQUE: Right 2-D digital diagnostic mammogram was performed followed by 3-D tomosynthesis. Real-time targeted ultrasound of the right breast was performed.  Current study was also evaluated with a computer
aided detection (CAD) system.
MAMMOGRAM FINDINGS:
The breast is almost entirely fatty.
The previously seen asymmetry in the right breast at [DATE] is much less conspicuous on the current examination and may well have related to superimposition of fibroglandular densities.
ULTRASOUND FINDINGS:
There is no sonographic correlate. Targeted right breast ultrasound is essentially negative.

[Series 2: us breast right ltd · 14 of 18 slices shown]
[im 1/18]
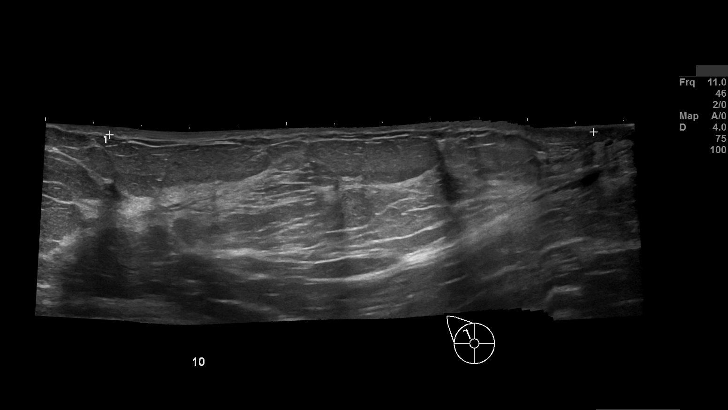
[im 2/18]
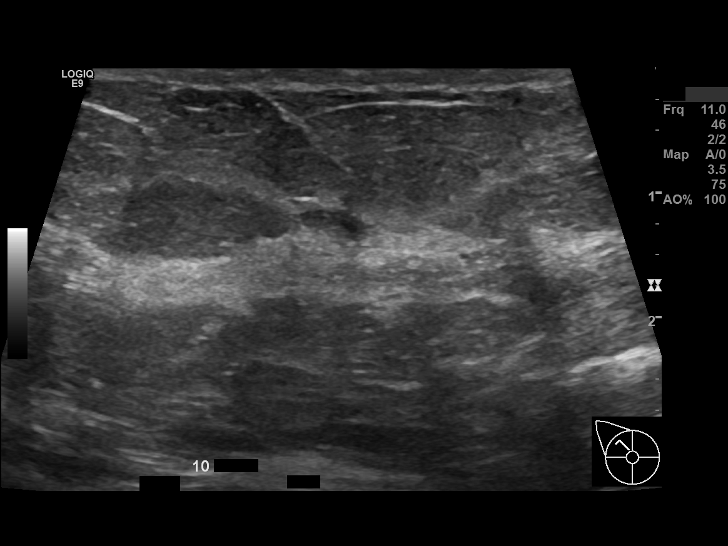
[im 4/18]
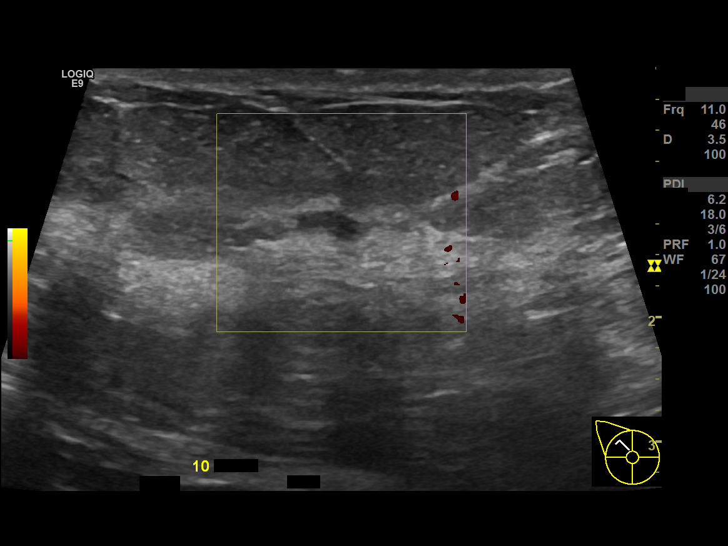
[im 5/18]
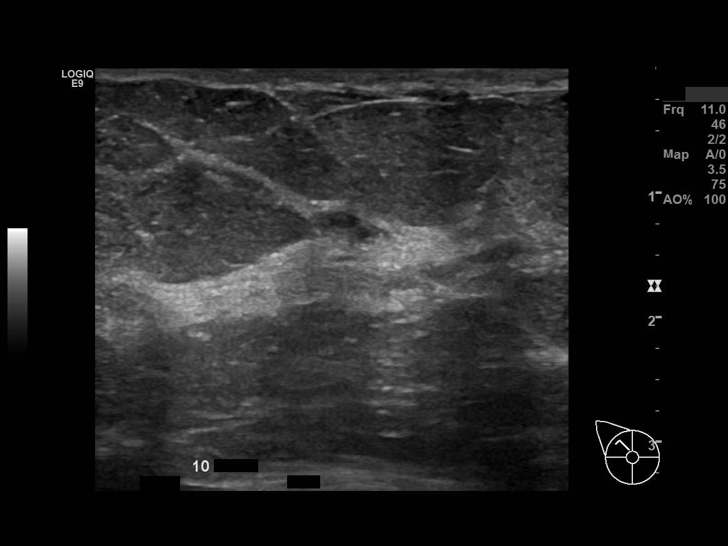
[im 6/18]
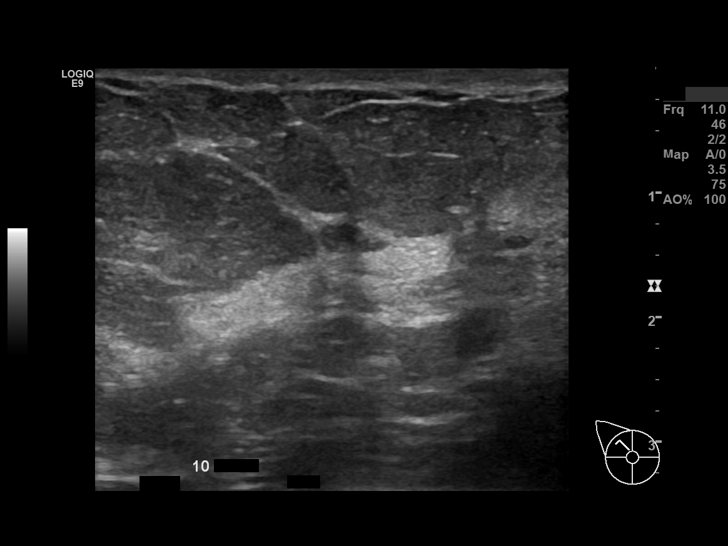
[im 8/18]
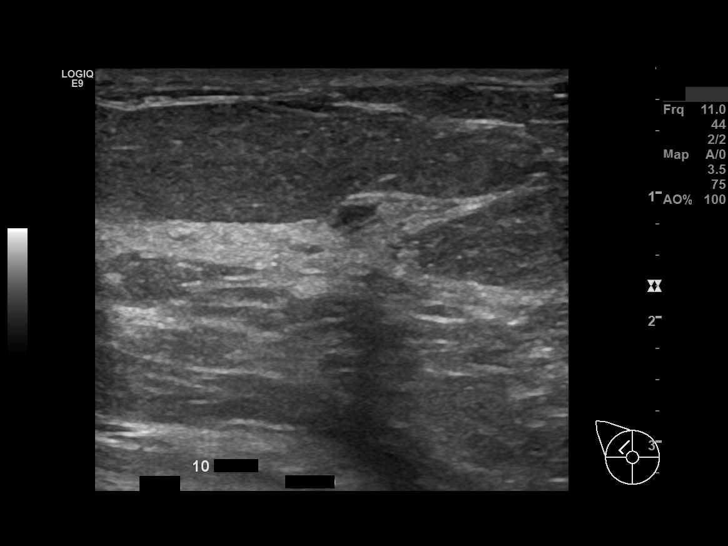
[im 9/18]
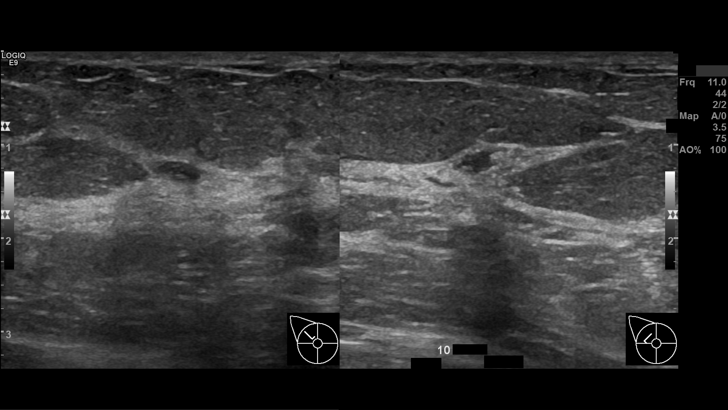
[im 10/18]
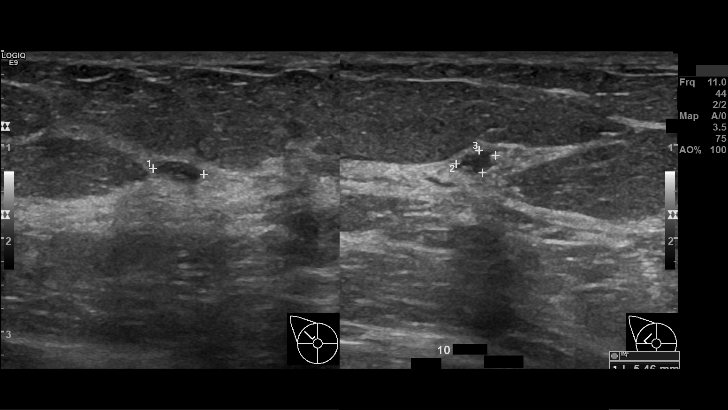
[im 11/18]
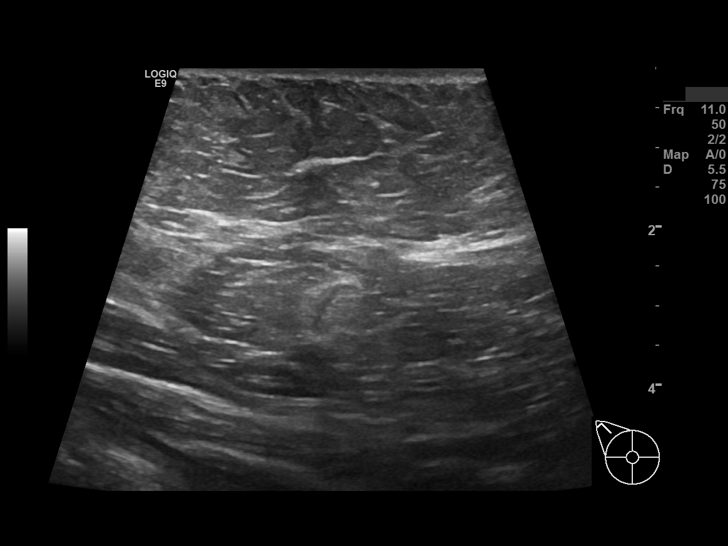
[im 13/18]
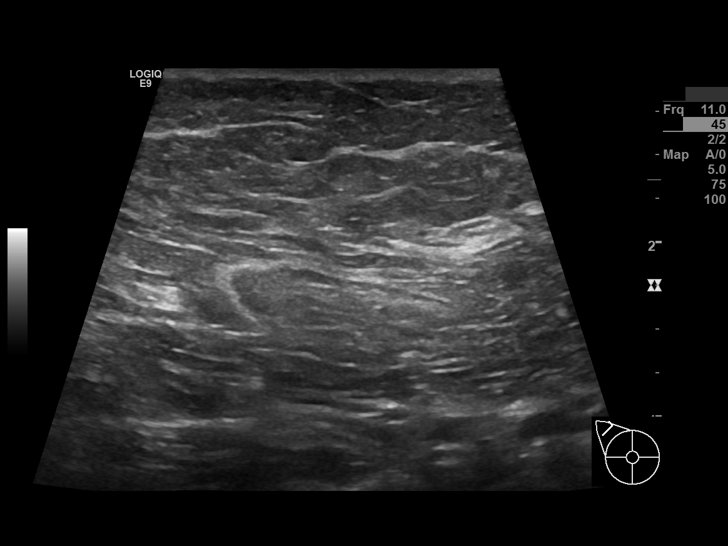
[im 14/18]
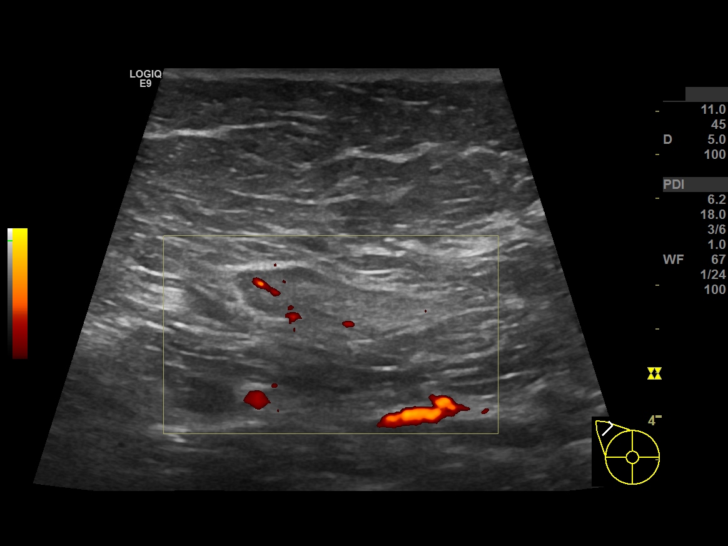
[im 15/18]
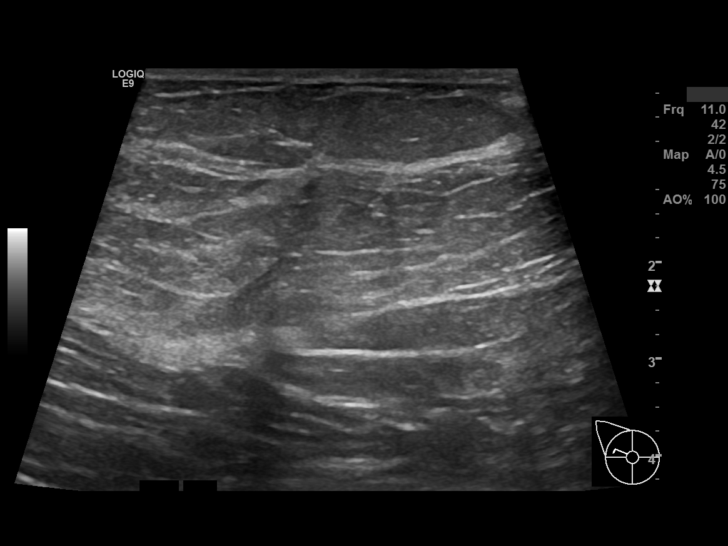
[im 17/18]
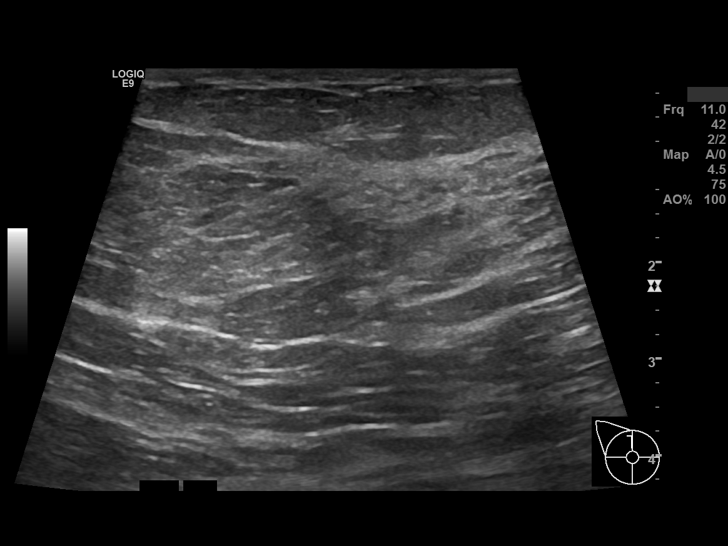
[im 18/18]
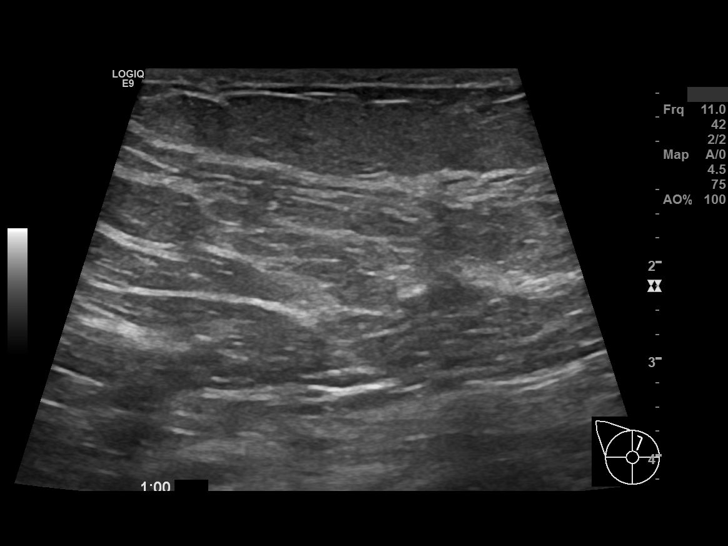

[14 of 18 positions shown; findings below may reference images not displayed]

IMPRESSION: 1.  Previously seen right breast asymmetry is inconspicuous on the additional mammographic views done today and may have related to superimposition of fibroglandular densities.
2.  As a precaution, as a change was questioned, recommend follow-up right breast diagnostic mammogram in 6 months.
3.  Findings were addressed and documented and recommendations were discussed with the patient at the time of the visit. The patient was given a copy of the results.
BI-RADS Category 3: Probably benign

## 2023-03-24 IMAGING — US US LIVER
1 series · 14 of 25 positions shown · non-contrast
Comparison: Liver ultrasound study 09/23/2022.

Images Obtained from [facility]
HISTORY: 81 years-old Female with Cirrhosis.
TECHNIQUE: Using real-time and Doppler ultrasound, the liver and gallbladder were evaluated.

[Series 1: us liver · 14 of 36 slices shown]
[im 1/36]
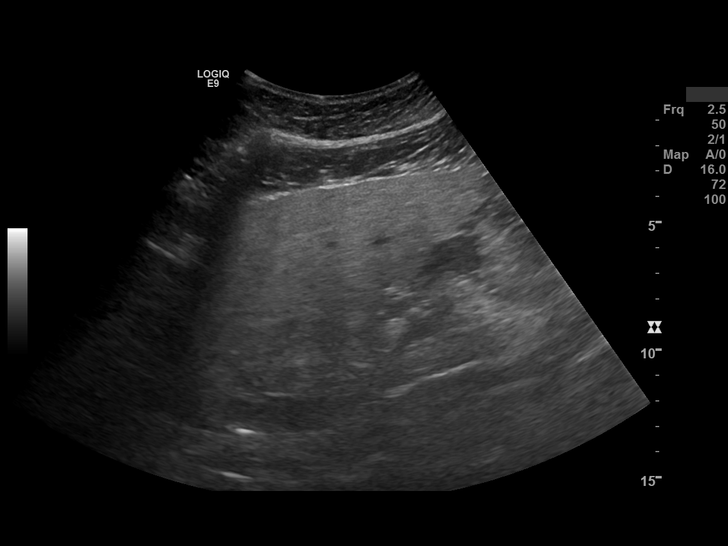
[im 3/36]
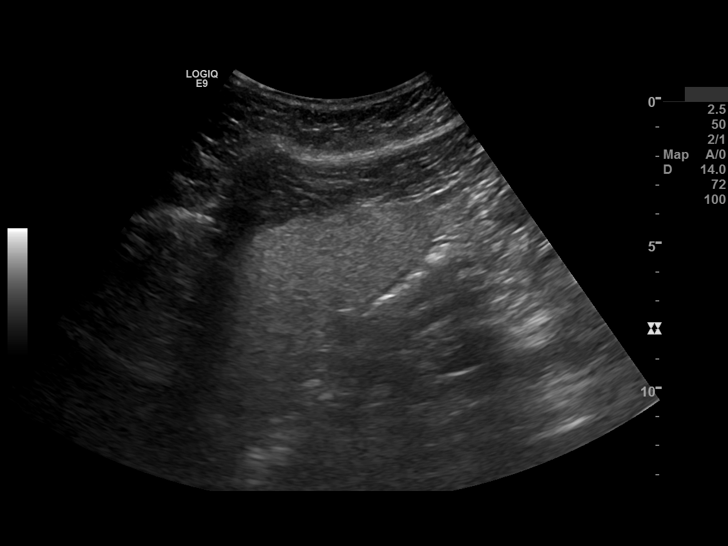
[im 6/36]
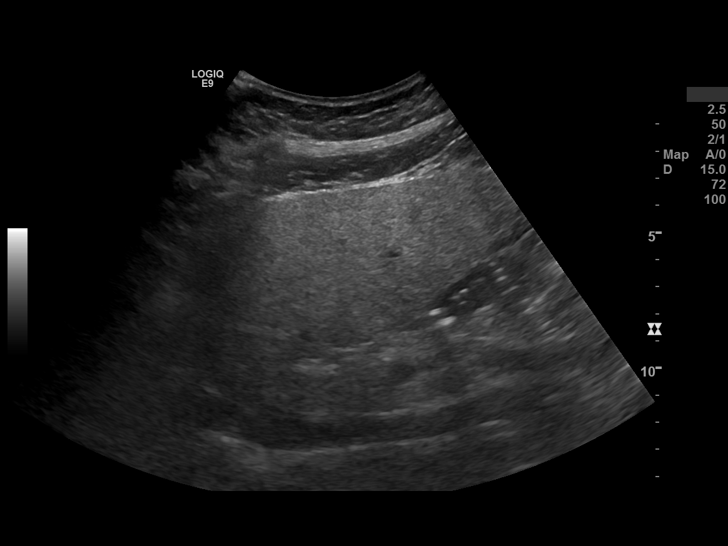
[im 9/36]
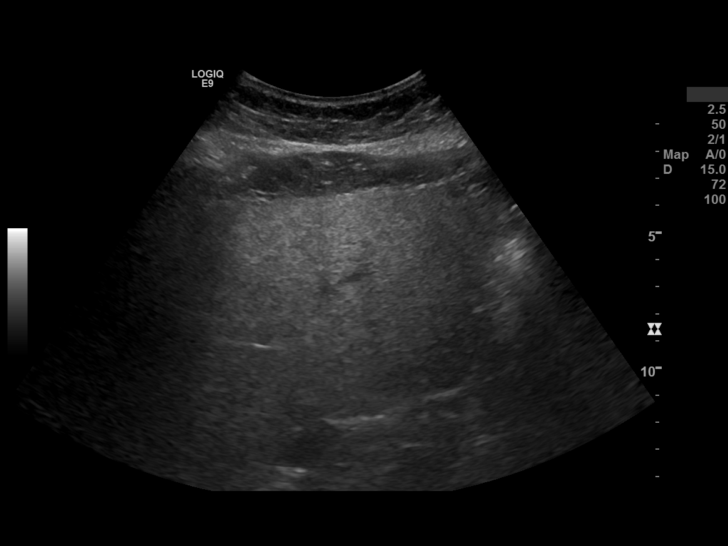
[im 12/36]
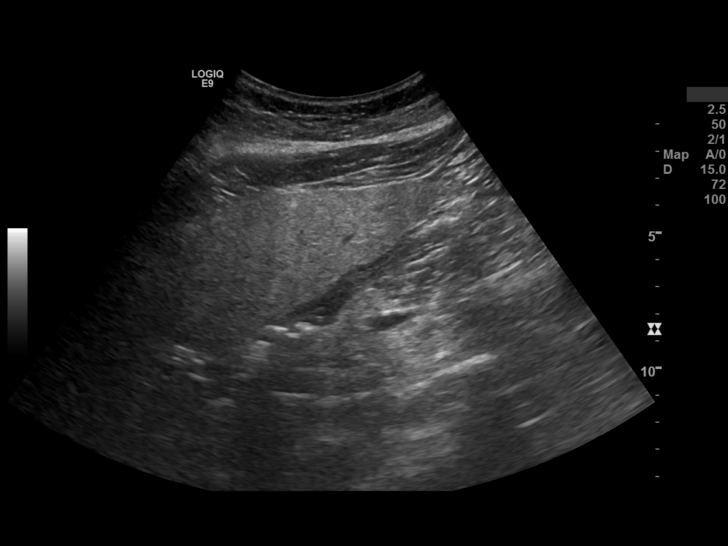
[im 14/36]
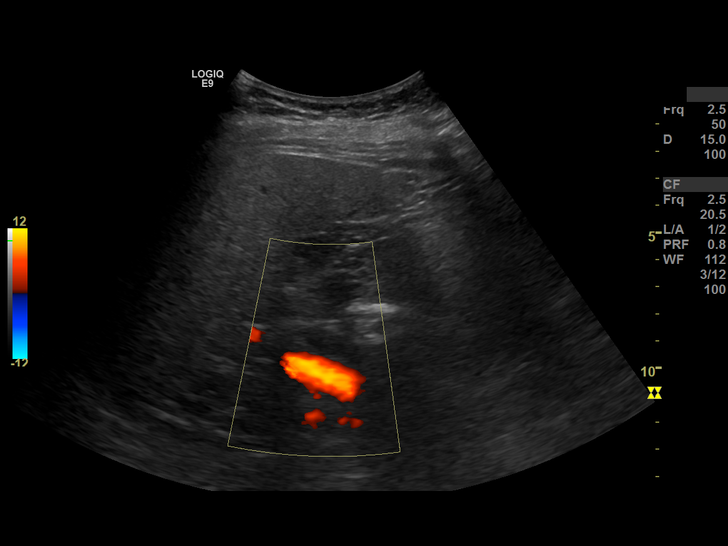
[im 17/36]
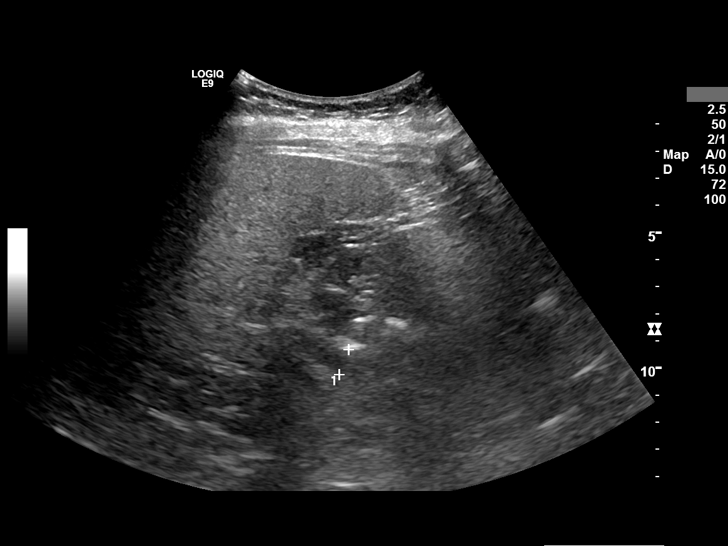
[im 19/36]
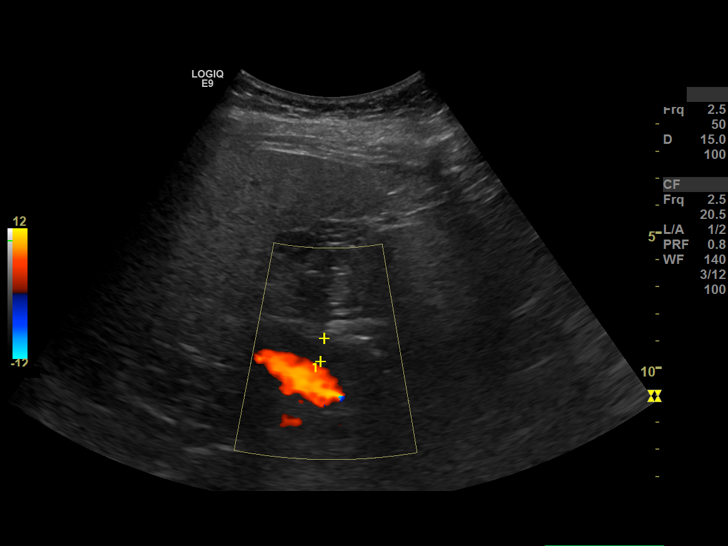
[im 22/36]
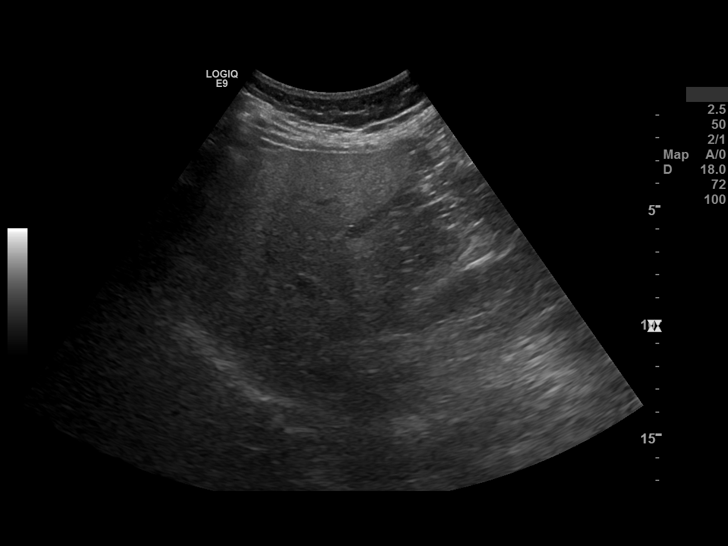
[im 24/36]
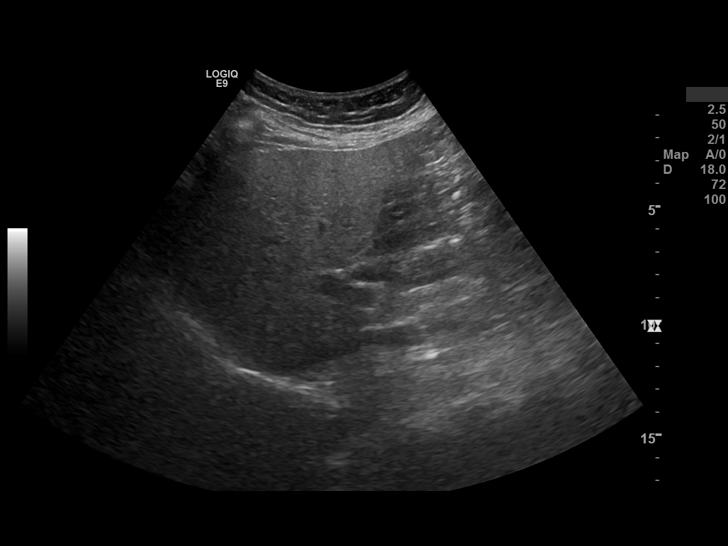
[im 27/36]
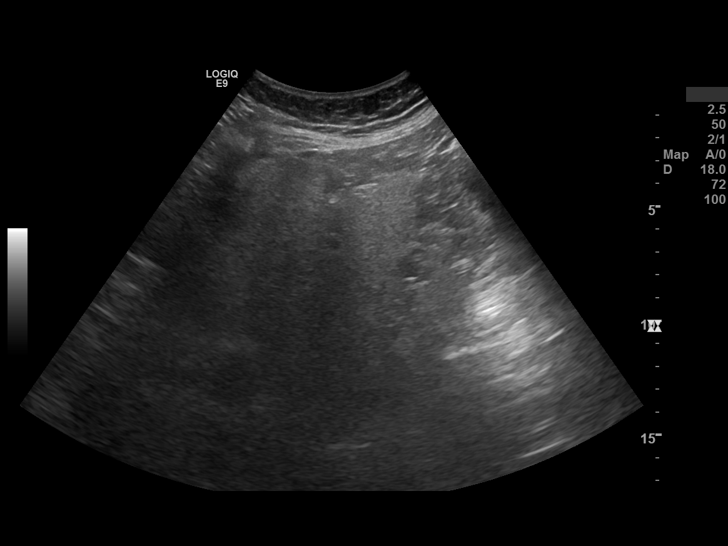
[im 30/36]
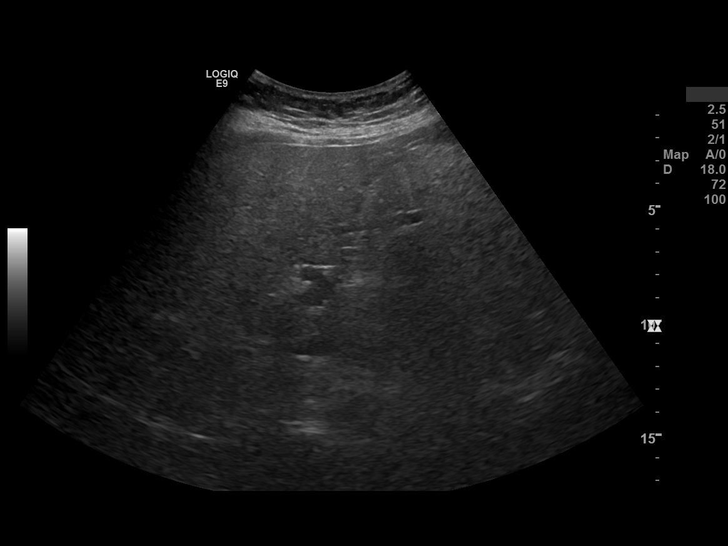
[im 33/36]
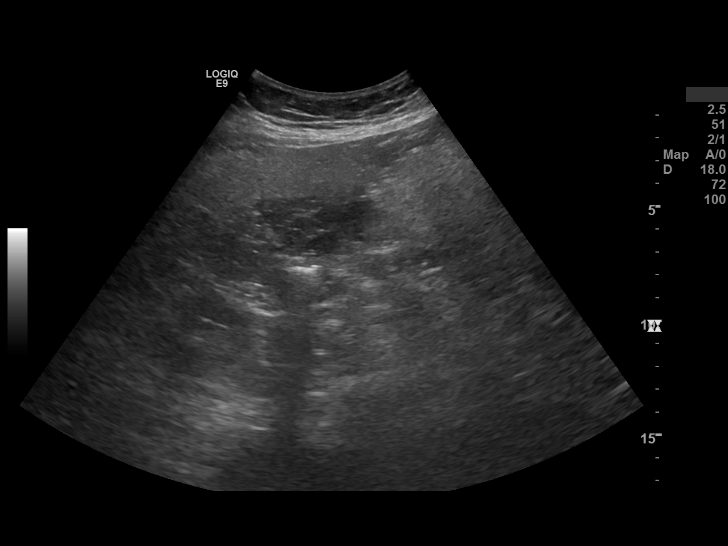
[im 36/36]
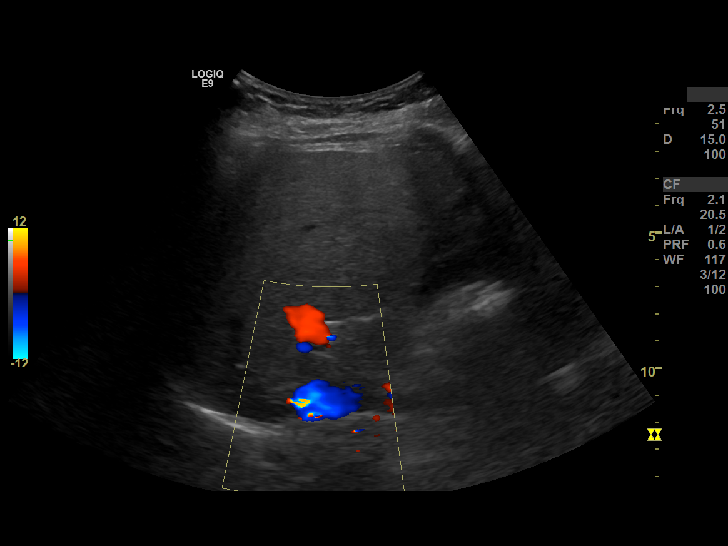

[14 of 25 positions shown; findings below may reference images not displayed]

FINDINGS: Liver: Length of  121 mm in the right midclavicular line. Increased echogenicity of liver seen. Liver contour is smooth. Hepatopetal flow is identified in the portal vein. No focal masses identified.
No intrahepatic biliary ductal dilatation is seen.
Gallbladder: The gallbladder is surgically absent. There is a  negative sonographic Murphy's sign. The common bile duct measures 8.63  mm.
Visualized portions of right kidney are normal.
There is no ascites.
IMPRESSION: 1.  Increased echogenicity of liver possibly due to liver cirrhosis again seen. No liver mass lesion is seen.
2.  No interval change.
